# Patient Record
Sex: Female | Born: 1945
Health system: Southern US, Community
[De-identification: ages and names within clinical notes are randomized; demographics above are authoritative.]

## PROBLEM LIST (undated history)

## (undated) DIAGNOSIS — I10 Essential (primary) hypertension: Secondary | ICD-10-CM

## (undated) DIAGNOSIS — N184 Chronic kidney disease, stage 4 (severe): Secondary | ICD-10-CM

## (undated) DIAGNOSIS — I4819 Other persistent atrial fibrillation: Secondary | ICD-10-CM

## (undated) DIAGNOSIS — N289 Disorder of kidney and ureter, unspecified: Secondary | ICD-10-CM

## (undated) DIAGNOSIS — I5032 Chronic diastolic (congestive) heart failure: Secondary | ICD-10-CM

## (undated) DIAGNOSIS — Q600 Renal agenesis, unilateral: Secondary | ICD-10-CM

## (undated) DIAGNOSIS — D649 Anemia, unspecified: Secondary | ICD-10-CM

## (undated) HISTORY — DX: Other persistent atrial fibrillation: I48.19

## (undated) HISTORY — DX: Chronic diastolic (congestive) heart failure: I50.32

## (undated) HISTORY — DX: Chronic kidney disease, stage 4 (severe): N18.4

## (undated) HISTORY — DX: Renal agenesis, unilateral: Q60.0

## (undated) HISTORY — DX: Anemia, unspecified: D64.9

---

## 2000-06-04 ENCOUNTER — Encounter: Admission: RE | Admit: 2000-06-04 | Discharge: 2000-06-04 | Payer: Self-pay | Admitting: Internal Medicine

## 2000-06-04 ENCOUNTER — Encounter: Payer: Self-pay | Admitting: Internal Medicine

## 2000-06-18 ENCOUNTER — Encounter: Admission: RE | Admit: 2000-06-18 | Discharge: 2000-06-18 | Payer: Self-pay | Admitting: Internal Medicine

## 2000-06-18 ENCOUNTER — Encounter: Payer: Self-pay | Admitting: Internal Medicine

## 2001-09-12 ENCOUNTER — Inpatient Hospital Stay (HOSPITAL_COMMUNITY): Admission: AD | Admit: 2001-09-12 | Discharge: 2001-09-16 | Payer: Self-pay | Admitting: Internal Medicine

## 2001-09-12 ENCOUNTER — Encounter: Payer: Self-pay | Admitting: Internal Medicine

## 2001-09-13 ENCOUNTER — Encounter: Payer: Self-pay | Admitting: Internal Medicine

## 2001-11-07 ENCOUNTER — Encounter: Admission: RE | Admit: 2001-11-07 | Discharge: 2001-11-07 | Payer: Self-pay | Admitting: Internal Medicine

## 2001-11-07 ENCOUNTER — Encounter: Payer: Self-pay | Admitting: Internal Medicine

## 2001-11-13 ENCOUNTER — Ambulatory Visit (HOSPITAL_BASED_OUTPATIENT_CLINIC_OR_DEPARTMENT_OTHER): Admission: RE | Admit: 2001-11-13 | Discharge: 2001-11-13 | Payer: Self-pay | Admitting: Internal Medicine

## 2002-01-11 ENCOUNTER — Encounter: Payer: Self-pay | Admitting: Internal Medicine

## 2002-01-11 ENCOUNTER — Encounter: Admission: RE | Admit: 2002-01-11 | Discharge: 2002-01-11 | Payer: Self-pay | Admitting: Internal Medicine

## 2002-02-15 ENCOUNTER — Encounter: Payer: Self-pay | Admitting: Internal Medicine

## 2002-02-15 ENCOUNTER — Encounter: Admission: RE | Admit: 2002-02-15 | Discharge: 2002-02-15 | Payer: Self-pay | Admitting: Internal Medicine

## 2003-01-29 ENCOUNTER — Encounter: Admission: RE | Admit: 2003-01-29 | Discharge: 2003-01-29 | Payer: Self-pay | Admitting: Internal Medicine

## 2003-01-29 ENCOUNTER — Encounter: Payer: Self-pay | Admitting: Internal Medicine

## 2003-03-26 ENCOUNTER — Encounter: Payer: Self-pay | Admitting: Sports Medicine

## 2003-03-26 ENCOUNTER — Ambulatory Visit (HOSPITAL_COMMUNITY): Admission: RE | Admit: 2003-03-26 | Discharge: 2003-03-26 | Payer: Self-pay | Admitting: Sports Medicine

## 2005-08-25 ENCOUNTER — Encounter: Admission: RE | Admit: 2005-08-25 | Discharge: 2005-08-25 | Payer: Self-pay | Admitting: Internal Medicine

## 2006-07-27 ENCOUNTER — Ambulatory Visit (HOSPITAL_COMMUNITY): Admission: RE | Admit: 2006-07-27 | Discharge: 2006-07-27 | Payer: Self-pay | Admitting: Family Medicine

## 2006-07-27 ENCOUNTER — Emergency Department (HOSPITAL_COMMUNITY): Admission: EM | Admit: 2006-07-27 | Discharge: 2006-07-27 | Payer: Self-pay | Admitting: Family Medicine

## 2006-10-30 ENCOUNTER — Encounter: Admission: RE | Admit: 2006-10-30 | Discharge: 2006-10-30 | Payer: Self-pay | Admitting: Sports Medicine

## 2006-12-14 ENCOUNTER — Inpatient Hospital Stay (HOSPITAL_COMMUNITY): Admission: AD | Admit: 2006-12-14 | Discharge: 2006-12-15 | Payer: Self-pay | Admitting: Orthopedic Surgery

## 2007-09-06 ENCOUNTER — Emergency Department (HOSPITAL_COMMUNITY): Admission: EM | Admit: 2007-09-06 | Discharge: 2007-09-06 | Payer: Self-pay | Admitting: Family Medicine

## 2009-03-01 ENCOUNTER — Encounter: Admission: RE | Admit: 2009-03-01 | Discharge: 2009-03-01 | Payer: Self-pay | Admitting: Family Medicine

## 2009-03-15 ENCOUNTER — Encounter: Admission: RE | Admit: 2009-03-15 | Discharge: 2009-03-15 | Payer: Self-pay | Admitting: Family Medicine

## 2009-05-07 ENCOUNTER — Encounter: Admission: RE | Admit: 2009-05-07 | Discharge: 2009-05-07 | Payer: Self-pay | Admitting: Surgery

## 2009-05-08 ENCOUNTER — Encounter (INDEPENDENT_AMBULATORY_CARE_PROVIDER_SITE_OTHER): Payer: Self-pay | Admitting: Surgery

## 2009-05-08 ENCOUNTER — Ambulatory Visit (HOSPITAL_BASED_OUTPATIENT_CLINIC_OR_DEPARTMENT_OTHER): Admission: RE | Admit: 2009-05-08 | Discharge: 2009-05-08 | Payer: Self-pay | Admitting: Surgery

## 2009-05-08 ENCOUNTER — Encounter: Admission: RE | Admit: 2009-05-08 | Discharge: 2009-05-08 | Payer: Self-pay | Admitting: Surgery

## 2011-01-18 LAB — COMPREHENSIVE METABOLIC PANEL
AST: 22 U/L (ref 0–37)
Albumin: 4 g/dL (ref 3.5–5.2)
BUN: 27 mg/dL — ABNORMAL HIGH (ref 6–23)
Calcium: 10.2 mg/dL (ref 8.4–10.5)
Creatinine, Ser: 1.6 mg/dL — ABNORMAL HIGH (ref 0.4–1.2)
GFR calc Af Amer: 40 mL/min — ABNORMAL LOW (ref >60)
GFR calc non Af Amer: 33 mL/min — ABNORMAL LOW (ref >60)

## 2011-01-18 LAB — URINALYSIS, ROUTINE W REFLEX MICROSCOPIC
Glucose, UA: NEGATIVE mg/dL
Ketones, ur: NEGATIVE mg/dL
Nitrite: NEGATIVE
Specific Gravity, Urine: 1.019 (ref 1.005–1.030)
pH: 6 (ref 5.0–8.0)

## 2011-01-18 LAB — DIFFERENTIAL
Eosinophils Relative: 0 % (ref 0–5)
Lymphocytes Relative: 15 % (ref 12–46)
Lymphs Abs: 2.4 10*3/uL (ref 0.7–4.0)
Monocytes Absolute: 0.9 10*3/uL (ref 0.1–1.0)
Neutro Abs: 12.9 10*3/uL — ABNORMAL HIGH (ref 1.7–7.7)

## 2011-01-18 LAB — CBC
MCHC: 33.3 g/dL (ref 30.0–36.0)
MCV: 87.9 fL (ref 78.0–100.0)
Platelets: 343 10*3/uL (ref 150–400)

## 2011-02-24 NOTE — Op Note (Signed)
Jaclyn Day, Jaclyn Day             ACCOUNT NO.:  [id number]   MEDICAL RECORD NO.:  [id number]          PATIENT TYPE:  AMB   LOCATION:  [place]                          FACILITY:  [facility]   PHYSICIAN:  Haywood Lasso, M.D.DATE OF BIRTH:  03/08/1946   DATE OF PROCEDURE:  05/08/2009  DATE OF DISCHARGE:                               OPERATIVE REPORT   PREOPERATIVE DIAGNOSIS:  Atypical lobular hyperplasia, left breast.   POSTOPERATIVE DIAGNOSIS:  Atypical lobular hyperplasia, left breast.   OPERATION:  Needle-guided excision of area of atypical hyperplasia.   SURGEON:  Haywood Lasso, MD   ANESTHESIA:  General.   CLINICAL HISTORY:  This is a 65 year old lady, who recently had a core  biopsy of an abnormality seen on mammography.  This had some atypical  lobular hyperplasia and a wider excisional biopsy was recommended until  we have a more definitive diagnosis.   DESCRIPTION OF PROCEDURE:  The patient was seen in the holding area and  she had no further questions.  The patient does have some degree of  schizophrenia and her sister was also here with her and I went over with  her sister as well all the plans.   The patient was taken to the operating room and after satisfactory  general anesthesia had been obtained, the left breast was prepped and  draped.  The time-out was done.   I had already reviewed the mammogram films, and the guidewire entered  laterally and tracked medially.  I made a transverse incision just above  the guidewire entry site, but utilizing the old core biopsy site.  I  elevated a few skin flaps and manipulated the guidewire into the wound.  I then grasped tissue around the guidewire on either side with some  Allis clamps and excised a wide cylinder of tissue around the guidewire  until I was deep to the tip.  This was oriented for pathology.   Specimen mammogram was done showing the clip to be in approximately the  middle of the specimen.  This was  confirmed by the radiologist.   I infiltrated some 0.25% plain Marcaine in.  I irrigated and made sure  everything was dry and then closed in layers with 3-0 Vicryl, 4-0  Monocryl subcuticular, and Dermabond.   The patient tolerated the procedure well and there were no  complications.  All counts were correct.      Haywood Lasso, M.D.  Electronically Signed     CJS/MEDQ  D:  05/08/2009  T:  05/09/2009  Job:  [id number]   cc:   Heath Gold, PA-C

## 2011-02-27 NOTE — Discharge Summary (Signed)
[place]. [facility]  Patient:    Jaclyn Day, Jaclyn Day Visit Number: [id number] MRN: [id number]          Service Type: MED Location: [phone] Attending Physician:  Anne Ng Dictated by:   Nehemiah Settle, M.D. Admit Date:  09/12/2001 Discharge Date: 09/16/2001                             Discharge Summary  ADMISSION DIAGNOSIS:  Shortness of breath.  DISCHARGE DIAGNOSES: 1. Shortness of breath secondary to bronchitis and atelectasis. 2. Elevated CK with no evidence of coronary artery disease. 3. Major depression. 4. Chronic hypertension. 5. Morbid obesity.  Please see admission history and physical examination for details.  HOSPITAL COURSE:  The patient was admitted and was given higher doses of Lasix initially.  Her chest x-ray actually looked better at admission than it had three days prior to admission at which time it seemed to indicate congestive heart failure.  In any event, she continued to be mildly hypoxic.  She had a CT scan of the chest which showed no evidence of pulmonary embolism but did reveal evidence of atelectasis.  CKs were elevated with positive MB, but troponins were negative.  She underwent cardiac catheterization by Fabio Asa, M.D., with negative findings.  We were able to taper her oxygen off and it was presumed that atelectasis secondary to bronchitis was the cause of her shortness of breath.  No other cause could be found with careful evaluation.  She did complain of some abdominal pain the night prior to discharge. We checked a CBC just prior to discharge and it was fine. The right groin was slightly bruised. There was no evidence of bruit in the right groin.  DISCHARGE MEDICATIONS: 1. Neurontin 200 mg morning, afternoon, and 400 mg at bedtime. 2. Seroquel 25 mg at these same times. 3. Klonopin 0.5 mg b.i.d. 4. Plendil 5 mg b.i.d. 5. Atenolol once daily. 6. Altace 2.5 mg daily. 7. Lasix 40 mg  daily.  ACTIVITY:  As tolerated.  DIET:  No added salt.  FOLLOW-UP:  She will have follow-up with me on September 26, 2001, as already scheduled. Dictated by:   Nehemiah Settle, M.D. Attending Physician:  Anne Ng DD:  09/16/01 TD:  09/16/01 Job: [id number]

## 2011-02-27 NOTE — Op Note (Signed)
Jaclyn Day, Jaclyn Day NO.:  [id number]   MEDICAL RECORD NO.:  [id number]          PATIENT TYPE:  OIB   LOCATION:  5731                         FACILITY:  [facility]   PHYSICIAN:  Ninetta Lights, M.D. DATE OF BIRTH:  1946/01/13   DATE OF PROCEDURE:  12/13/2006  DATE OF DISCHARGE:                               OPERATIVE REPORT   PREOPERATIVE DIAGNOSIS:  Left knee torn medial meniscus with  tricompartmental degenerative arthritis.   POSTOPERATIVE DIAGNOSES:  1. Torn medial and lateral meniscus, left knee with diffuse      tricompartmental degenerative arthritis grade 3, and even grade 4.  2. Chondral and osteochondral loose bodies.   PROCEDURE:  1. Left knee exam under anesthesia.  2. Arthroscopy.  3. Partial medial and lateral meniscectomy.  4. Chondroplasty of the patellofemoral joint, as well as medial and      lateral compartments.  5. Removal of chondral and osteochondral loose bodies.   SURGEON:  Ninetta Lights, M.D.   ASSISTANT:  Alyson Locket. Velora Heckler.   ANESTHESIA:  General.   BLOOD LOSS:  Minimal.   TOURNIQUET:  Not employed.   SPECIMENS:  None.   CULTURES:  None.   COMPLICATIONS:  None.   DRESSINGS:  Soft compressive.   PROCEDURE:  The patient brought to the operating room and after adequate  anesthesia had been obtained, the left knee examined.  About a 5-degree  flexion contracture, further flexion reasonable.  Stable ligaments.  Patellofemoral and some tibial femoral crepitus.  Tourniquet and leg  holder applied.  The leg prepped and draped in the usual sterile  fashion.  Three portals created, one superolateral, one each medial and  lateral parapatellar.  Inflow catheter introduced.  The knee distended.  Arthroscope introduced and the knee inspected.  Good patellofemoral  tracking, but extensive grade 3 changes both sides of the joint.  Chondroplasty to a stable surface.  Hypertrophic synovitis, chondral  loose bodies throughout the  knee removed.  Medial and lateral  compartments had at least grade 3 changes throughout.  On the medial  side, some areas that were grade 4, both on the tibia and femur.  Chondroplasty to a stable surface.  The chondral loose bodies and some  small osteochondral loose bodies were removed with a grasping forceps.  Medical meniscus extensive complex tearing posterior half taken out to a  stable rim and tapered into remaining meniscus.  Lateral meniscus  tearing in the middle of the anterior third with extension into the  posterior third.  __________  retaining some of the meniscus around the  rim at completion.  Cruciate ligament is intact.  At the completion all  recesses examined to be sure all loose fragments were removed.  Instruments and fluid removed.  Portals in the knee injected with  Marcaine.  Portals closed with 4-0 nylon.  A sterile compressive  dressing applied.  Anesthesia reversed.  Brought to recovery room.  Tolerated surgery well with no complications.      Ninetta Lights, M.D.  Electronically Signed     DFM/MEDQ  D:  12/13/2006  T:  12/13/2006  Job:  [id number]

## 2011-02-27 NOTE — H&P (Signed)
[place]. [facility]  Patient:    Jaclyn, Day Visit Number: [id number] MRN: [id number]          Service Type: Attending:  Anne Ng, M.D. Dictated by:   Anne Ng, M.D.                           History and Physical  HISTORY OF PRESENT ILLNESS:  Jaclyn Day is a 65 year old lady who had initially presented to the office on September 09, 2001 with complaints of increased shortness of breath associated with dyspnea on exertion and ankle edema.  A chest x-ray obtained at that time showed signs of cardiomegaly, vascular redistribution and interstitial edema.  For this reason, I adjusted her medications by instructing her to stop Hytrin and to start Altace 2.5 mg daily as well as Lasix 40 mg daily.  At the time of this visit, her blood pressure was 108/64.  Unfortunately, since this time her condition has, if anything, worsened.  She is having increased orthopnea, she states that she is having increased ankle edema, dyspnea seems to be worse and she is unable to sleep at night.  There has been no recent history of chest pain, although she does state that about five days ago she had an aching or throbbing which she localizes to the mid-scapular area.  She is admitted to the hospital for the purpose of evaluation of worsening congestive heart failure.  PAST MEDICAL HISTORY:  Other active health problems include: 1. Major depression. 2. Morbid obesity. 3. Chronic hypertension.  PAST SURGICAL HISTORY: 1. Laparoscopic cholecystectomy. 2. History of unilateral nephrectomy for a congenital abnormality. 3. The patient has also had previous wrist and foot fractures.  CURRENT MEDICATIONS: 1. Neurontin 200 mg in the morning, 200 mg in the afternoon, 400 mg in the    evening. 2. Seroquel 25 mg at these same times. 3. She also takes Klonopin 0.25 mg twice day. 4. Plendil 5 mg twice daily. 5. Atenolol once daily. 6. Altace 2.5 mg daily. 7.  Lasix 40 mg daily.  ALLERGIES:  She reports that she is allergic to CODEINE which has produced hives.  SOCIAL HISTORY:  The patient does not smoke.  She denies the use of alcohol. The patient is felt to have evidence of significant mental retardation with IQ scores in the 60 to 80 range and her sister has power of attorney as well as health care power of attorney.  FAMILY HISTORY:  Her mother died of congestive heart failure and had a prior history of colon cancer.  Her father had pulmonary fibrosis and diabetes.  REVIEW OF SYSTEMS:  HEAD:  She denies headache or dizziness.  EYES:  She denies visual blurring or diplopia.  EARS, NOSE AND THROAT:  She denies earache, sinus pain or sore throat.  CHEST:  She is experiencing a persistent dry cough.  She has significant orthopnea.  She denies chest pain, as noted above.  CARDIOVASCULAR:  See above.  GI:  She has had no nausea, vomiting, abdominal pain, change in bowel habits, melena or hematochezia.  GU:  She denies dysuria or urinary frequency, hesitancy or nocturia.  RHEUMATOLOGIC: She denies back pain or joint pain.  HEMATOLOGIC:  She denies easy bleeding or bruising.  NEUROLOGIC:  She denies history of seizure or stroke.  PHYSICAL EXAMINATION:  VITAL SIGNS:  On physical exam, her weight is 249 pounds, blood pressure is 120/70, pulse is 68.  HEENT/NECK:  Remarkable for significant jugular venous distention.  There is no lymphadenopathy or thyromegaly.  CHEST:  Rales two-thirds of the way up, right greater than left.  CARDIOVASCULAR:  Regular rate and rhythm.  I do not really hear an S3.  There are no rubs or gallops.  ABDOMEN:  Obese and normal bowel sounds; without masses, tenderness or organomegaly.  EXTREMITIES:  Examination reveals 3+ pretibial edema.  IMPRESSION: 1. Evidence of worsening congestive heart failure. 2. Major depression. 3. History of morbid obesity. 4. Hypertension. 5. Chronic headaches. 6. Significant  mental retardation. 7. Status post cholecystectomy. 8. Status post nephrectomy.  The patient will be admitted to the hospital for the purpose of treating congestive heart failure unresponsive to outpatient management.  We will increase her Lasix dosage and continue the ACE inhibitor.  We will continue to withhold Hytrin.  She already has an echocardiogram scheduled for today and we will look carefully at the results of this test.  Presumably, cardiology consultation may be indicated.  I will obtain serial cardiac enzymes, although I doubt that she is currently having an myocardial infarction.  We will also check O2 saturation and adjust oxygen accordingly.  It should be noted that on September 09, 2001, the patient had a basic metabolic profile and complete blood count which were remarkable only for a hemoglobin of 10.8. Dictated by:   Anne Ng, M.D. Attending:  Anne Ng, M.D. DD:  09/12/01 TD:  09/12/01 Job: [id number]

## 2011-02-27 NOTE — Op Note (Signed)
[place]. [facility]  Patient:    Jaclyn Day, Jaclyn Day Visit Number: [id number] MRN: [id number]          Service Type: MED Location: [phone] Attending Physician:  Anne Ng Dictated by:   Fabio Asa, M.D. Proc. Date: 09/15/01 Admit Date:  09/12/2001   CC:         Dr. Octavia Bruckner   Operative Report  REFERRING PHYSICIAN:  Dr. Octavia Bruckner ______.  INDICATIONS FOR PROCEDURE:  Positive cardiac enzymes, dyspnea.  DESCRIPTION OF PROCEDURE:  After obtaining written informed consent, the patient was brought to the cardiac catheterization laboratory in a post-absorptive state.  Preoperative sedation was achieved using IV Versed. The right groin was prepped and draped in a usual sterile fashion.  Local anesthesia was achieved using 1% Xylocaine.  A 6 French hemostasis sheath was placed into the right femoral artery.  I was unable to obtain access in the right femoral vein, therefore, I proceed with the left femoral vein.  The left groin was prepped and draped.  Local anesthesia was achieved using 1% Xylocaine.  An 8 [place] hemostasis sheath was placed into the left femoral vein using a modified Seldinger technique.  Right heart catheterization data was obtained using a 7 [place] Cort-Quin swan.  Coronary angiography was performed using a JL4/JR4 Judkins catheter.  Multiple views were obtained. All catheter exchanges were made over a guide wire.  Single plane ventriculogram was made using a 6 French pigtail curved catheter.  The patient was given 3000 units of heparin prior to the procedure.  Following the procedure, ICT was obtained and was noted to be 169.  The patient was transferred to the holding area where the hemostasis sheath would be removed.  FINDINGS:  The aortic pressure is 140/82, LV pressure is 144/9, right atrial mean is 10, RV pressure is 43/8, PA pressure is 38/15, ______ is 21.  Single plane ventriculogram revealed normal wall motion with an  ejection fraction of 60%.  There was no mitral regurgitation noted.  Coronary angiography:  The left main coronary artery was bifurcated into the left anterior descending artery and circumflex vessel.  There was no disease noted in the left main coronary artery.  Left anterior descending artery:  The left anterior descending artery gives rise to a large D1, moderate D2, small D3, and ends as an apical recurrent branch.  There is no significant disease of the left anterior descending artery nor its branches.  Circumflex vessels:  Circumflex vessel is a moderate size vessel giving rise to a large OM1, large OM2, and goes on to the end of an AV groove.  There is no significant disease in the circumflex or its branches.  Right coronary artery:  The right coronary artery is a large dominant artery, giving rise to a small RV marginal one, large PDA large PL branch.  There is no significant disease in the right coronary artery or its branches.  IMPRESSION: 1. Normal coronary angiography. 2. Normal ventriculogram. 3. Normal pulmonary artery pressure. 4. Normal cardiac outputs.  Cardiac output average was 6.3.  RECOMMENDATIONS:  The patient will be transferred to her room.  She may be discharged from a cardiology standpoint when her O2 saturation is more than 90.  It is likely that her O2 saturation is the result of atelectasis, and deconditioning. Dictated by:   Fabio Asa, M.D. Attending Physician:  Anne Ng DD:  09/15/01 TD:  09/15/01 Job: [id number]

## 2011-02-27 NOTE — Discharge Summary (Signed)
Jaclyn Day, Jaclyn Day NO.:  [id number]   MEDICAL RECORD NO.:  [id number]          PATIENT TYPE:  INP   LOCATION:  [id number]                         FACILITY:  [facility]   PHYSICIAN:  Ninetta Lights, M.D. DATE OF BIRTH:  1946-07-19   DATE OF ADMISSION:  12/13/2006  DATE OF DISCHARGE:                               [place] COURSE:  December 15, 2006, the patient doing very well.  Good pain  control.  She is ambulating without extreme difficulty.  Complained of  moderate discomfort.  States that she is ready for transfer today to  skilled facility.  Temp 97.1, pulse 61, respirations 20, blood pressure  138/78.   INSTRUCTIONS:  As previously noted, the patient will start formal  physical therapy for left knee range of motion and strengthening  protocol.  Weightbearing as tolerated.  Later on this afternoon, the  dressing can be removed and the patient can shower.  No tub soaking.  Apply Band-Aids to portals.  If she has any drainage from her portals,  can apply 4 x 4 gauze and rewrap with a 6 inch Ace wrap.  Please call  the office to schedule an appointment for next Tuesday for recheck.  Let  us know if there are any concerns or issues before that time.     ______________________________  unknown      Ninetta Lights, M.D.  Electronically Signed    Hillard Danker  D:  12/15/2006  T:  12/15/2006  Job:  [id number]

## 2011-02-27 NOTE — Discharge Summary (Signed)
Jaclyn Day, Jaclyn NO.:  [id number]   MEDICAL RECORD NO.:  [id number]          PATIENT TYPE:  OIB   LOCATION:  [id number]                         FACILITY:  [facility]   PHYSICIAN:  Ninetta Lights, M.D. DATE OF BIRTH:  1945/10/17   DATE OF ADMISSION:  12/13/2006  DATE OF DISCHARGE:  12/14/2006                               DISCHARGE SUMMARY   FINAL DIAGNOSES:  1. Status post left knee arthroscopy with debridement for      medial/lateral meniscal tears and degenerative joint disease.  2. Sleep apnea.  3. Right nephrectomy.  4. Hypertension.  5. Depression.   HISTORY OF PRESENT ILLNESS:  A 65 year old white female who presented to  our office with complaints of left knee pain secondary to DJD and  meniscus tears. She had progressive worsening pain with failed response  to conservative treatment.   HOSPITAL COURSE:  On December 13, 2006 the patient was taken to the [facility] and a left knee arthroscopy with debridement and partial/lateral  meniscectomy procedure performed.  Surgeon Kathryne Hitch, M.D. and  assistant Alyson Locket. Ricard Dillon, PA-C.  Anesthesia was general.  EBL minimal.  There were no surgical specimens.  No complications and the patient was  transferred to recovery in stable condition.  The patient admitted for  overnight observation due to a history of sleep apnea.  Plan to transfer  to [place] December 14, 2006.  On December 14, 2006 the patient  doing very well with good pain control.  No specific complaints.  She  states that she is ready for transfer.  Temperature 97.5, pulse 67,  respirations 20, blood pressure 125/56.  WBC 14.4, hematocrit 35.2,  hemoglobin 12.1, platelets 256, sodium 136, potassium 4.2, chloride 102,  CO2 28, BUN 12, creatinine 1.3, glucose 119.  Dressing clean, dry and  intact.  Calves nontender.  Neurovascularly intact.  Range of motion of  0 to 95+ degrees.   DISPOSITION:  Transfer to extended care today.   CONDITION:   Good and stable.   MEDICATIONS:  1. Percocet 5/325, 1-2 tabs p.o. q.4-6h. p.r.n. for pain.  2. Colace 100 mg p.o. b.i.d.  3. Hydrochlorothiazide 25 mg p.o. daily.  4. Lopressor 50 mg p.o. daily.  5. Paxil 20 mg p.o. daily.  6. Lamictal 100 mg p.o. daily.  7. Norvasc 10 mg p.o. daily.  8. Lotensin 20 mg p.o. daily.  9. Robaxin 500 mg p.o. q.6h. p.r.n. for spasms.  10.Ativan 0.5 mg p.o. q.12h. p.r.n. for anxiety.   DISCHARGE INSTRUCTIONS:  The patient will work with formal physical  therapy for left knee range of motion and strengthening protocol after  knee arthroscopy.  She is weightbearing as tolerated.  Dressing to be  removed 48 hours postop and the patient can shower at that time.  Apply  Band-Aid's over surgical portals.  She will have follow up in the office  with Dr. Percell Miller one week postop for recheck.  Call the office  immediately with any questions or concerns before that time.      Alyson Locket. Ricard Dillon, P.A.  Ninetta Lights, M.D.  Electronically Signed    JMO/MEDQ  D:  12/14/2006  T:  12/14/2006  Job:  [id number]

## 2011-02-27 NOTE — Consult Note (Signed)
[facility]  Patient:    Jaclyn Day, Jaclyn Day Visit Number: [id number] MRN: [id number]          Service Type: Attending:  Fabio Asa, M.D. Dictated by:   Fabio Asa, M.D.   CC:         Anne Ng, M.D.             Nehemiah Settle, M.D.                          Consultation Report  REFERRING PHYSICIAN:  Anne Ng, M.D.  REASON FOR CONSULTATION:  Dyspnea.  HISTORY OF PRESENT ILLNESS:  Jaclyn Day is a 65 year old female who noticed an abrupt onset of shortness of breath five days prior to admission. She states the shortness of breath occurred suddenly while walking with her boyfriend to the car.  This shortness of breath forced her to rest for 10 minutes before she was able to resume activity.  She has continued to remain short of breath and was evaluated by Dr. Anne Ng in the outpatient clinic on Friday.  A chest x-ray was obtained.  Chest x-ray was suggestive of cardiomegaly and mild congestive heart failure.  The patient was subsequently started on Altace and Lasix.  She had no significant improvement over the weekend.  She presented to the clinic again on Monday.  At this time, she was noted to have ongoing shortness of breath.  No chest pain.  She had a productive green phlegm cough but no fever.  She did have chills.  There was no orthopnea, PND, tachyarrhythmia.  The patient noted pedal edema.  It was elected to admit the patient for inpatient treatment for congestive heart failure.  A transesophageal echocardiogram was obtained.  She was noted to normal chamber dimensions, normal systolic function.  Her ejection fraction was approximately 65-75%.  Normal valvular morphology and normal transmitral flow velocity.  Upon admission to the hospital, she was started on Lasix 80 mg IV x 1 and 40 mg IV q.12h.  She was continued on Plendil 5 mg q.d. and Atenolol for her blood pressure.  Her O2 saturation on room air  was noted to be 87%.  Serial cardiac enzymes were obtained.  She was noted to have a mildly elevated CK at 262 with a MB of 8.4.  Of interest, she had a normal troponin.  Chest x-ray was obtained as well.  This revealed mild cardiomegaly but no evidence of interstitial edema.  Following treatment with O2, Lasix, and continuation of her antihypertensive, Ms. Debari notes significant improvement in her shortness of breath.  PAST MEDICAL HISTORY:  Significant for severe depression.  She currently is under the care of a psychiatrist.  Hypertension for more than 10 years. Chronic vertigo.  Chronic headache.  PAST SURGICAL HISTORY:  Significant for a cholecystectomy.  Left nephrectomy, etiology nephrectomy is unknown.  Excision of a bone cyst from her right arm.  MEDICATIONS: 1. Atenolol 50 mg q.d. 2. Altace 2.5 mg q.d. 3. Lasix 40 mg q.d. 4. Seroquel 25 mg b.i.d. 5. Paxil 40 mg q.d. 6. Neurontin 100 mg in the morning and 200 mg q.p.m. 7. Plendil 50 mg p.o. q.d.  ADMISSION MEDICATIONS: 1. Change of p.o. Lasix to Lasix 80 mg IV x 1 and then 40 mg x 2. 2. Her Altace is currently on hold. 3. She was added sublingual nitroglycerin and Ambien as well as Robitussin    for her  cough.  ALLERGIES:  CODEINE with result in a rash.  SOCIAL HISTORY:  She is divorced since 42.  She has a boyfriend.  Has a supportive sister who she list as her contact person.  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  There is no history of tobacco use, alcohol use, or other drugs.  FAMILY HISTORY:  There is no significant family history of coronary artery disease.  PHYSICAL EXAMINATION:  VITAL SIGNS:  Her systolic pressure has ranged from 149 to 168/84.  Heart rate has been from 70 to 85 beats per minute.  She is afebrile.  O2 saturation is 94% on three liters.  Telemetry is not obtained.  GENERAL:  In general, she appears to be an obese middle-aged female in  no acute distress.  HEENT:  Unremarkable.  NECK:  There is no evidence of neck vein distention.  There is no carotid bruit.  Her thyroid is not palpable.  LUNGS:  Breath sounds which are equal and clear to auscultation.  There is no crackles noted.  CARDIOVASCULAR:  Normal S1 and normal S2 with regular rate and rhythm.  No murmurs, rubs, or gallops are noted.  ABDOMEN:  Obese.  Exam is made difficult.  No unusual bruits or pulsations are noted.  EXTREMITIES:  No peripheral edema.  Distal pulses are equal and palpable.  SKIN:  Warm and dry.  NEUROLOGICAL:  Nonfocal.  Her motor is 5/5 to her arm.  LABORATORY DATA:  ECG was reviewed and reveals a sinus rhythm.  There is nonspecific ST changes.  Electrolytes are within normal limits with the exception of a mildly elevated glucose at 124.  Her white count is slightly elevated at 11.6.  Her hemoglobin is 11.9.  Serial CKs are as noted above. Her initial CK was slightly elevated at 262 with a MB fraction of 8.4.  She has a normal troponin.  IMPRESSION:  Middle-aged female with abrupt onset of dyspnea by history.  It is most suggestive of an upper respiratory infection.  However, in view of her slightly elevated CK, cardiac etiology must be considered.  I would initially obtained a spinal CT to evaluate for pulmonary emboli in that she has the risk factors of obesity and sedentary activity.  Pulmonary function test with diffusion capacity should be obtained as well.  If these are normal, coronary angiography with right heart catheterization will be considered in view of her elevated CK.  Additionally, I would add Lovenox per pharmacy protocol as well as enteric-coated aspirin.  Telemetry monitoring has been ordered.  Thank you for allowing me to participate in the care of your patient.  I will discuss this patient further with you.Dictated by:   Fabio Asa, M.D.  Attending:  Fabio Asa, M.D.  DD:  09/13/01 TD:   09/13/01 Job: [id number]

## 2011-05-22 ENCOUNTER — Ambulatory Visit (HOSPITAL_COMMUNITY)
Admission: RE | Admit: 2011-05-22 | Discharge: 2011-05-22 | Disposition: A | Discharge status: Home / Self Care | Payer: Medicare Other | Source: Ambulatory Visit | Attending: Sports Medicine | Admitting: Sports Medicine

## 2011-05-22 DIAGNOSIS — M7989 Other specified soft tissue disorders: Secondary | ICD-10-CM

## 2011-10-19 DIAGNOSIS — R059 Cough, unspecified: Secondary | ICD-10-CM | POA: Diagnosis not present

## 2011-10-19 DIAGNOSIS — R05 Cough: Secondary | ICD-10-CM | POA: Diagnosis not present

## 2011-10-30 DIAGNOSIS — I10 Essential (primary) hypertension: Secondary | ICD-10-CM | POA: Diagnosis not present

## 2011-10-30 DIAGNOSIS — Z79899 Other long term (current) drug therapy: Secondary | ICD-10-CM | POA: Diagnosis not present

## 2011-10-30 DIAGNOSIS — E119 Type 2 diabetes mellitus without complications: Secondary | ICD-10-CM | POA: Diagnosis not present

## 2011-10-30 DIAGNOSIS — M1710 Unilateral primary osteoarthritis, unspecified knee: Secondary | ICD-10-CM | POA: Diagnosis not present

## 2011-10-30 DIAGNOSIS — IMO0002 Reserved for concepts with insufficient information to code with codable children: Secondary | ICD-10-CM | POA: Diagnosis not present

## 2011-11-27 DIAGNOSIS — F333 Major depressive disorder, recurrent, severe with psychotic symptoms: Secondary | ICD-10-CM | POA: Diagnosis not present

## 2012-03-04 DIAGNOSIS — F333 Major depressive disorder, recurrent, severe with psychotic symptoms: Secondary | ICD-10-CM | POA: Diagnosis not present

## 2012-04-29 DIAGNOSIS — I10 Essential (primary) hypertension: Secondary | ICD-10-CM | POA: Diagnosis not present

## 2012-04-29 DIAGNOSIS — M1710 Unilateral primary osteoarthritis, unspecified knee: Secondary | ICD-10-CM | POA: Diagnosis not present

## 2012-04-29 DIAGNOSIS — E119 Type 2 diabetes mellitus without complications: Secondary | ICD-10-CM | POA: Diagnosis not present

## 2012-04-29 DIAGNOSIS — Z79899 Other long term (current) drug therapy: Secondary | ICD-10-CM | POA: Diagnosis not present

## 2012-04-29 DIAGNOSIS — E7800 Pure hypercholesterolemia, unspecified: Secondary | ICD-10-CM | POA: Diagnosis not present

## 2012-04-29 DIAGNOSIS — IMO0002 Reserved for concepts with insufficient information to code with codable children: Secondary | ICD-10-CM | POA: Diagnosis not present

## 2012-05-03 DIAGNOSIS — IMO0002 Reserved for concepts with insufficient information to code with codable children: Secondary | ICD-10-CM | POA: Diagnosis not present

## 2012-05-03 DIAGNOSIS — M1710 Unilateral primary osteoarthritis, unspecified knee: Secondary | ICD-10-CM | POA: Diagnosis not present

## 2012-06-03 DIAGNOSIS — F333 Major depressive disorder, recurrent, severe with psychotic symptoms: Secondary | ICD-10-CM | POA: Diagnosis not present

## 2012-06-17 DIAGNOSIS — M79609 Pain in unspecified limb: Secondary | ICD-10-CM | POA: Diagnosis not present

## 2012-07-22 DIAGNOSIS — IMO0002 Reserved for concepts with insufficient information to code with codable children: Secondary | ICD-10-CM | POA: Diagnosis not present

## 2012-07-22 DIAGNOSIS — M1710 Unilateral primary osteoarthritis, unspecified knee: Secondary | ICD-10-CM | POA: Diagnosis not present

## 2012-07-22 DIAGNOSIS — Z23 Encounter for immunization: Secondary | ICD-10-CM | POA: Diagnosis not present

## 2012-07-22 DIAGNOSIS — Z79899 Other long term (current) drug therapy: Secondary | ICD-10-CM | POA: Diagnosis not present

## 2012-07-22 DIAGNOSIS — I10 Essential (primary) hypertension: Secondary | ICD-10-CM | POA: Diagnosis not present

## 2012-09-02 DIAGNOSIS — F333 Major depressive disorder, recurrent, severe with psychotic symptoms: Secondary | ICD-10-CM | POA: Diagnosis not present

## 2012-09-30 DIAGNOSIS — M109 Gout, unspecified: Secondary | ICD-10-CM | POA: Diagnosis not present

## 2012-09-30 DIAGNOSIS — I10 Essential (primary) hypertension: Secondary | ICD-10-CM | POA: Diagnosis not present

## 2012-09-30 DIAGNOSIS — K219 Gastro-esophageal reflux disease without esophagitis: Secondary | ICD-10-CM | POA: Diagnosis not present

## 2012-10-03 DIAGNOSIS — M25579 Pain in unspecified ankle and joints of unspecified foot: Secondary | ICD-10-CM | POA: Diagnosis not present

## 2012-10-03 DIAGNOSIS — M19079 Primary osteoarthritis, unspecified ankle and foot: Secondary | ICD-10-CM | POA: Diagnosis not present

## 2012-10-21 DIAGNOSIS — M109 Gout, unspecified: Secondary | ICD-10-CM | POA: Diagnosis not present

## 2012-10-21 DIAGNOSIS — IMO0002 Reserved for concepts with insufficient information to code with codable children: Secondary | ICD-10-CM | POA: Diagnosis not present

## 2012-10-21 DIAGNOSIS — E7800 Pure hypercholesterolemia, unspecified: Secondary | ICD-10-CM | POA: Diagnosis not present

## 2012-10-21 DIAGNOSIS — M1710 Unilateral primary osteoarthritis, unspecified knee: Secondary | ICD-10-CM | POA: Diagnosis not present

## 2012-10-21 DIAGNOSIS — E119 Type 2 diabetes mellitus without complications: Secondary | ICD-10-CM | POA: Diagnosis not present

## 2012-10-21 DIAGNOSIS — I10 Essential (primary) hypertension: Secondary | ICD-10-CM | POA: Diagnosis not present

## 2013-01-20 DIAGNOSIS — N183 Chronic kidney disease, stage 3 unspecified: Secondary | ICD-10-CM | POA: Diagnosis not present

## 2013-01-20 DIAGNOSIS — Z9181 History of falling: Secondary | ICD-10-CM | POA: Diagnosis not present

## 2013-01-20 DIAGNOSIS — E119 Type 2 diabetes mellitus without complications: Secondary | ICD-10-CM | POA: Diagnosis not present

## 2013-01-20 DIAGNOSIS — I10 Essential (primary) hypertension: Secondary | ICD-10-CM | POA: Diagnosis not present

## 2013-01-20 DIAGNOSIS — K219 Gastro-esophageal reflux disease without esophagitis: Secondary | ICD-10-CM | POA: Diagnosis not present

## 2013-02-14 DIAGNOSIS — Z6841 Body Mass Index (BMI) 40.0 and over, adult: Secondary | ICD-10-CM | POA: Diagnosis not present

## 2013-02-14 DIAGNOSIS — M79609 Pain in unspecified limb: Secondary | ICD-10-CM | POA: Diagnosis not present

## 2013-02-24 DIAGNOSIS — F333 Major depressive disorder, recurrent, severe with psychotic symptoms: Secondary | ICD-10-CM | POA: Diagnosis not present

## 2013-03-03 DIAGNOSIS — E119 Type 2 diabetes mellitus without complications: Secondary | ICD-10-CM | POA: Diagnosis not present

## 2013-03-03 DIAGNOSIS — H5210 Myopia, unspecified eye: Secondary | ICD-10-CM | POA: Diagnosis not present

## 2013-03-03 DIAGNOSIS — H2510 Age-related nuclear cataract, unspecified eye: Secondary | ICD-10-CM | POA: Diagnosis not present

## 2013-03-03 DIAGNOSIS — H524 Presbyopia: Secondary | ICD-10-CM | POA: Diagnosis not present

## 2013-05-05 DIAGNOSIS — M1710 Unilateral primary osteoarthritis, unspecified knee: Secondary | ICD-10-CM | POA: Diagnosis not present

## 2013-05-05 DIAGNOSIS — I10 Essential (primary) hypertension: Secondary | ICD-10-CM | POA: Diagnosis not present

## 2013-05-05 DIAGNOSIS — N318 Other neuromuscular dysfunction of bladder: Secondary | ICD-10-CM | POA: Diagnosis not present

## 2013-05-05 DIAGNOSIS — E119 Type 2 diabetes mellitus without complications: Secondary | ICD-10-CM | POA: Diagnosis not present

## 2013-05-05 DIAGNOSIS — IMO0002 Reserved for concepts with insufficient information to code with codable children: Secondary | ICD-10-CM | POA: Diagnosis not present

## 2013-05-19 DIAGNOSIS — F333 Major depressive disorder, recurrent, severe with psychotic symptoms: Secondary | ICD-10-CM | POA: Diagnosis not present

## 2013-05-26 DIAGNOSIS — Z79899 Other long term (current) drug therapy: Secondary | ICD-10-CM | POA: Diagnosis not present

## 2013-07-17 DIAGNOSIS — M25569 Pain in unspecified knee: Secondary | ICD-10-CM | POA: Diagnosis not present

## 2013-07-24 DIAGNOSIS — M1710 Unilateral primary osteoarthritis, unspecified knee: Secondary | ICD-10-CM | POA: Diagnosis not present

## 2013-07-24 DIAGNOSIS — S8000XA Contusion of unspecified knee, initial encounter: Secondary | ICD-10-CM | POA: Diagnosis not present

## 2013-07-24 DIAGNOSIS — M25469 Effusion, unspecified knee: Secondary | ICD-10-CM | POA: Diagnosis not present

## 2013-08-11 DIAGNOSIS — F3342 Major depressive disorder, recurrent, in full remission: Secondary | ICD-10-CM | POA: Diagnosis not present

## 2013-08-17 DIAGNOSIS — E119 Type 2 diabetes mellitus without complications: Secondary | ICD-10-CM | POA: Diagnosis not present

## 2013-08-17 DIAGNOSIS — M1710 Unilateral primary osteoarthritis, unspecified knee: Secondary | ICD-10-CM | POA: Diagnosis not present

## 2013-08-17 DIAGNOSIS — Z23 Encounter for immunization: Secondary | ICD-10-CM | POA: Diagnosis not present

## 2013-08-17 DIAGNOSIS — I10 Essential (primary) hypertension: Secondary | ICD-10-CM | POA: Diagnosis not present

## 2013-08-17 DIAGNOSIS — IMO0002 Reserved for concepts with insufficient information to code with codable children: Secondary | ICD-10-CM | POA: Diagnosis not present

## 2013-08-17 DIAGNOSIS — N318 Other neuromuscular dysfunction of bladder: Secondary | ICD-10-CM | POA: Diagnosis not present

## 2013-08-23 DIAGNOSIS — IMO0002 Reserved for concepts with insufficient information to code with codable children: Secondary | ICD-10-CM | POA: Diagnosis not present

## 2013-08-23 DIAGNOSIS — M1710 Unilateral primary osteoarthritis, unspecified knee: Secondary | ICD-10-CM | POA: Diagnosis not present

## 2013-09-22 DIAGNOSIS — N3946 Mixed incontinence: Secondary | ICD-10-CM | POA: Diagnosis not present

## 2013-09-22 DIAGNOSIS — N318 Other neuromuscular dysfunction of bladder: Secondary | ICD-10-CM | POA: Diagnosis not present

## 2013-09-22 DIAGNOSIS — N281 Cyst of kidney, acquired: Secondary | ICD-10-CM | POA: Diagnosis not present

## 2013-09-22 DIAGNOSIS — N390 Urinary tract infection, site not specified: Secondary | ICD-10-CM | POA: Diagnosis not present

## 2013-09-24 DIAGNOSIS — N390 Urinary tract infection, site not specified: Secondary | ICD-10-CM | POA: Diagnosis not present

## 2013-09-26 DIAGNOSIS — N281 Cyst of kidney, acquired: Secondary | ICD-10-CM | POA: Diagnosis not present

## 2013-09-26 DIAGNOSIS — N3942 Incontinence without sensory awareness: Secondary | ICD-10-CM | POA: Diagnosis not present

## 2013-09-26 DIAGNOSIS — N390 Urinary tract infection, site not specified: Secondary | ICD-10-CM | POA: Diagnosis not present

## 2013-09-26 DIAGNOSIS — K5730 Diverticulosis of large intestine without perforation or abscess without bleeding: Secondary | ICD-10-CM | POA: Diagnosis not present

## 2013-10-03 DIAGNOSIS — N3946 Mixed incontinence: Secondary | ICD-10-CM | POA: Diagnosis not present

## 2013-10-03 DIAGNOSIS — N390 Urinary tract infection, site not specified: Secondary | ICD-10-CM | POA: Diagnosis not present

## 2013-10-03 DIAGNOSIS — R3915 Urgency of urination: Secondary | ICD-10-CM | POA: Diagnosis not present

## 2013-10-03 DIAGNOSIS — R351 Nocturia: Secondary | ICD-10-CM | POA: Diagnosis not present

## 2013-10-20 DIAGNOSIS — N318 Other neuromuscular dysfunction of bladder: Secondary | ICD-10-CM | POA: Diagnosis not present

## 2013-10-20 DIAGNOSIS — R3915 Urgency of urination: Secondary | ICD-10-CM | POA: Diagnosis not present

## 2013-10-20 DIAGNOSIS — N390 Urinary tract infection, site not specified: Secondary | ICD-10-CM | POA: Diagnosis not present

## 2013-10-20 DIAGNOSIS — N3946 Mixed incontinence: Secondary | ICD-10-CM | POA: Diagnosis not present

## 2013-11-20 DIAGNOSIS — N390 Urinary tract infection, site not specified: Secondary | ICD-10-CM | POA: Diagnosis not present

## 2013-11-20 DIAGNOSIS — N3946 Mixed incontinence: Secondary | ICD-10-CM | POA: Diagnosis not present

## 2013-11-27 DIAGNOSIS — N318 Other neuromuscular dysfunction of bladder: Secondary | ICD-10-CM | POA: Diagnosis not present

## 2013-11-27 DIAGNOSIS — R3915 Urgency of urination: Secondary | ICD-10-CM | POA: Diagnosis not present

## 2013-11-27 DIAGNOSIS — N390 Urinary tract infection, site not specified: Secondary | ICD-10-CM | POA: Diagnosis not present

## 2013-11-29 DIAGNOSIS — N390 Urinary tract infection, site not specified: Secondary | ICD-10-CM | POA: Diagnosis not present

## 2013-12-06 DIAGNOSIS — N390 Urinary tract infection, site not specified: Secondary | ICD-10-CM | POA: Diagnosis not present

## 2013-12-06 DIAGNOSIS — N318 Other neuromuscular dysfunction of bladder: Secondary | ICD-10-CM | POA: Diagnosis not present

## 2013-12-08 DIAGNOSIS — N390 Urinary tract infection, site not specified: Secondary | ICD-10-CM | POA: Diagnosis not present

## 2013-12-11 DIAGNOSIS — N390 Urinary tract infection, site not specified: Secondary | ICD-10-CM | POA: Diagnosis not present

## 2013-12-11 DIAGNOSIS — N318 Other neuromuscular dysfunction of bladder: Secondary | ICD-10-CM | POA: Diagnosis not present

## 2013-12-15 DIAGNOSIS — M1710 Unilateral primary osteoarthritis, unspecified knee: Secondary | ICD-10-CM | POA: Diagnosis not present

## 2013-12-18 DIAGNOSIS — N390 Urinary tract infection, site not specified: Secondary | ICD-10-CM | POA: Diagnosis not present

## 2013-12-18 DIAGNOSIS — N318 Other neuromuscular dysfunction of bladder: Secondary | ICD-10-CM | POA: Diagnosis not present

## 2013-12-25 DIAGNOSIS — N318 Other neuromuscular dysfunction of bladder: Secondary | ICD-10-CM | POA: Diagnosis not present

## 2013-12-25 DIAGNOSIS — N390 Urinary tract infection, site not specified: Secondary | ICD-10-CM | POA: Diagnosis not present

## 2013-12-29 DIAGNOSIS — F209 Schizophrenia, unspecified: Secondary | ICD-10-CM | POA: Diagnosis not present

## 2013-12-29 DIAGNOSIS — Z79899 Other long term (current) drug therapy: Secondary | ICD-10-CM | POA: Diagnosis not present

## 2013-12-29 DIAGNOSIS — E7800 Pure hypercholesterolemia, unspecified: Secondary | ICD-10-CM | POA: Diagnosis not present

## 2013-12-29 DIAGNOSIS — I10 Essential (primary) hypertension: Secondary | ICD-10-CM | POA: Diagnosis not present

## 2013-12-29 DIAGNOSIS — H6120 Impacted cerumen, unspecified ear: Secondary | ICD-10-CM | POA: Diagnosis not present

## 2013-12-29 DIAGNOSIS — E119 Type 2 diabetes mellitus without complications: Secondary | ICD-10-CM | POA: Diagnosis not present

## 2013-12-29 DIAGNOSIS — N318 Other neuromuscular dysfunction of bladder: Secondary | ICD-10-CM | POA: Diagnosis not present

## 2014-01-08 DIAGNOSIS — N318 Other neuromuscular dysfunction of bladder: Secondary | ICD-10-CM | POA: Diagnosis not present

## 2014-01-08 DIAGNOSIS — Z1231 Encounter for screening mammogram for malignant neoplasm of breast: Secondary | ICD-10-CM | POA: Diagnosis not present

## 2014-01-15 DIAGNOSIS — N318 Other neuromuscular dysfunction of bladder: Secondary | ICD-10-CM | POA: Diagnosis not present

## 2014-01-26 DIAGNOSIS — F3342 Major depressive disorder, recurrent, in full remission: Secondary | ICD-10-CM | POA: Diagnosis not present

## 2014-02-16 DIAGNOSIS — N318 Other neuromuscular dysfunction of bladder: Secondary | ICD-10-CM | POA: Diagnosis not present

## 2014-02-16 DIAGNOSIS — N390 Urinary tract infection, site not specified: Secondary | ICD-10-CM | POA: Diagnosis not present

## 2014-02-16 DIAGNOSIS — N3946 Mixed incontinence: Secondary | ICD-10-CM | POA: Diagnosis not present

## 2014-02-16 DIAGNOSIS — R3915 Urgency of urination: Secondary | ICD-10-CM | POA: Diagnosis not present

## 2014-02-21 DIAGNOSIS — N390 Urinary tract infection, site not specified: Secondary | ICD-10-CM | POA: Diagnosis not present

## 2014-03-01 DIAGNOSIS — N318 Other neuromuscular dysfunction of bladder: Secondary | ICD-10-CM | POA: Diagnosis not present

## 2014-03-01 DIAGNOSIS — N3946 Mixed incontinence: Secondary | ICD-10-CM | POA: Diagnosis not present

## 2014-03-01 DIAGNOSIS — N390 Urinary tract infection, site not specified: Secondary | ICD-10-CM | POA: Diagnosis not present

## 2014-03-01 DIAGNOSIS — R3915 Urgency of urination: Secondary | ICD-10-CM | POA: Diagnosis not present

## 2014-04-06 DIAGNOSIS — M1710 Unilateral primary osteoarthritis, unspecified knee: Secondary | ICD-10-CM | POA: Diagnosis not present

## 2014-04-06 DIAGNOSIS — E119 Type 2 diabetes mellitus without complications: Secondary | ICD-10-CM | POA: Diagnosis not present

## 2014-04-06 DIAGNOSIS — H2510 Age-related nuclear cataract, unspecified eye: Secondary | ICD-10-CM | POA: Diagnosis not present

## 2014-04-20 DIAGNOSIS — R3915 Urgency of urination: Secondary | ICD-10-CM | POA: Diagnosis not present

## 2014-04-20 DIAGNOSIS — N3946 Mixed incontinence: Secondary | ICD-10-CM | POA: Diagnosis not present

## 2014-04-20 DIAGNOSIS — R351 Nocturia: Secondary | ICD-10-CM | POA: Diagnosis not present

## 2014-04-20 DIAGNOSIS — F3342 Major depressive disorder, recurrent, in full remission: Secondary | ICD-10-CM | POA: Diagnosis not present

## 2014-04-20 DIAGNOSIS — N390 Urinary tract infection, site not specified: Secondary | ICD-10-CM | POA: Diagnosis not present

## 2014-05-04 DIAGNOSIS — Z79899 Other long term (current) drug therapy: Secondary | ICD-10-CM | POA: Diagnosis not present

## 2014-05-04 DIAGNOSIS — Z9181 History of falling: Secondary | ICD-10-CM | POA: Diagnosis not present

## 2014-05-04 DIAGNOSIS — Z1331 Encounter for screening for depression: Secondary | ICD-10-CM | POA: Diagnosis not present

## 2014-05-04 DIAGNOSIS — E119 Type 2 diabetes mellitus without complications: Secondary | ICD-10-CM | POA: Diagnosis not present

## 2014-05-04 DIAGNOSIS — E7800 Pure hypercholesterolemia, unspecified: Secondary | ICD-10-CM | POA: Diagnosis not present

## 2014-06-15 DIAGNOSIS — M1710 Unilateral primary osteoarthritis, unspecified knee: Secondary | ICD-10-CM | POA: Diagnosis not present

## 2014-06-15 DIAGNOSIS — IMO0002 Reserved for concepts with insufficient information to code with codable children: Secondary | ICD-10-CM | POA: Diagnosis not present

## 2014-07-06 DIAGNOSIS — Z0000 Encounter for general adult medical examination without abnormal findings: Secondary | ICD-10-CM | POA: Diagnosis not present

## 2014-07-06 DIAGNOSIS — F209 Schizophrenia, unspecified: Secondary | ICD-10-CM | POA: Diagnosis not present

## 2014-07-06 DIAGNOSIS — I10 Essential (primary) hypertension: Secondary | ICD-10-CM | POA: Diagnosis not present

## 2014-07-06 DIAGNOSIS — E119 Type 2 diabetes mellitus without complications: Secondary | ICD-10-CM | POA: Diagnosis not present

## 2014-07-06 DIAGNOSIS — Z23 Encounter for immunization: Secondary | ICD-10-CM | POA: Diagnosis not present

## 2014-07-27 DIAGNOSIS — N3090 Cystitis, unspecified without hematuria: Secondary | ICD-10-CM | POA: Diagnosis not present

## 2014-07-27 DIAGNOSIS — N3946 Mixed incontinence: Secondary | ICD-10-CM | POA: Diagnosis not present

## 2014-07-27 DIAGNOSIS — N318 Other neuromuscular dysfunction of bladder: Secondary | ICD-10-CM | POA: Diagnosis not present

## 2014-07-27 DIAGNOSIS — R3915 Urgency of urination: Secondary | ICD-10-CM | POA: Diagnosis not present

## 2014-07-27 DIAGNOSIS — R351 Nocturia: Secondary | ICD-10-CM | POA: Diagnosis not present

## 2014-08-10 DIAGNOSIS — F3342 Major depressive disorder, recurrent, in full remission: Secondary | ICD-10-CM | POA: Diagnosis not present

## 2014-08-31 DIAGNOSIS — I10 Essential (primary) hypertension: Secondary | ICD-10-CM | POA: Diagnosis not present

## 2014-09-14 DIAGNOSIS — I10 Essential (primary) hypertension: Secondary | ICD-10-CM | POA: Diagnosis not present

## 2014-11-09 DIAGNOSIS — R634 Abnormal weight loss: Secondary | ICD-10-CM | POA: Diagnosis not present

## 2014-11-09 DIAGNOSIS — F259 Schizoaffective disorder, unspecified: Secondary | ICD-10-CM | POA: Diagnosis not present

## 2014-11-09 DIAGNOSIS — E119 Type 2 diabetes mellitus without complications: Secondary | ICD-10-CM | POA: Diagnosis not present

## 2014-11-09 DIAGNOSIS — M170 Bilateral primary osteoarthritis of knee: Secondary | ICD-10-CM | POA: Diagnosis not present

## 2014-11-09 DIAGNOSIS — M179 Osteoarthritis of knee, unspecified: Secondary | ICD-10-CM | POA: Diagnosis not present

## 2014-11-09 DIAGNOSIS — E780 Pure hypercholesterolemia: Secondary | ICD-10-CM | POA: Diagnosis not present

## 2014-11-09 DIAGNOSIS — I10 Essential (primary) hypertension: Secondary | ICD-10-CM | POA: Diagnosis not present

## 2014-11-16 DIAGNOSIS — F332 Major depressive disorder, recurrent severe without psychotic features: Secondary | ICD-10-CM | POA: Diagnosis not present

## 2014-11-30 DIAGNOSIS — R32 Unspecified urinary incontinence: Secondary | ICD-10-CM | POA: Diagnosis not present

## 2014-11-30 DIAGNOSIS — N390 Urinary tract infection, site not specified: Secondary | ICD-10-CM | POA: Diagnosis not present

## 2014-12-28 DIAGNOSIS — R32 Unspecified urinary incontinence: Secondary | ICD-10-CM | POA: Diagnosis not present

## 2014-12-28 DIAGNOSIS — R809 Proteinuria, unspecified: Secondary | ICD-10-CM | POA: Diagnosis not present

## 2014-12-28 DIAGNOSIS — N390 Urinary tract infection, site not specified: Secondary | ICD-10-CM | POA: Diagnosis not present

## 2015-01-11 DIAGNOSIS — I10 Essential (primary) hypertension: Secondary | ICD-10-CM | POA: Diagnosis not present

## 2015-01-11 DIAGNOSIS — M25572 Pain in left ankle and joints of left foot: Secondary | ICD-10-CM | POA: Diagnosis not present

## 2015-01-11 DIAGNOSIS — N183 Chronic kidney disease, stage 3 (moderate): Secondary | ICD-10-CM | POA: Diagnosis not present

## 2015-01-11 DIAGNOSIS — N3281 Overactive bladder: Secondary | ICD-10-CM | POA: Diagnosis not present

## 2015-01-25 DIAGNOSIS — R809 Proteinuria, unspecified: Secondary | ICD-10-CM | POA: Diagnosis not present

## 2015-01-25 DIAGNOSIS — R32 Unspecified urinary incontinence: Secondary | ICD-10-CM | POA: Diagnosis not present

## 2015-02-08 DIAGNOSIS — F332 Major depressive disorder, recurrent severe without psychotic features: Secondary | ICD-10-CM | POA: Diagnosis not present

## 2015-03-01 DIAGNOSIS — R809 Proteinuria, unspecified: Secondary | ICD-10-CM | POA: Diagnosis not present

## 2015-03-01 DIAGNOSIS — N183 Chronic kidney disease, stage 3 (moderate): Secondary | ICD-10-CM | POA: Diagnosis not present

## 2015-03-01 DIAGNOSIS — I10 Essential (primary) hypertension: Secondary | ICD-10-CM | POA: Diagnosis not present

## 2015-03-08 DIAGNOSIS — N281 Cyst of kidney, acquired: Secondary | ICD-10-CM | POA: Diagnosis not present

## 2015-03-08 DIAGNOSIS — R809 Proteinuria, unspecified: Secondary | ICD-10-CM | POA: Diagnosis not present

## 2015-03-08 DIAGNOSIS — R32 Unspecified urinary incontinence: Secondary | ICD-10-CM | POA: Diagnosis not present

## 2015-03-08 DIAGNOSIS — N183 Chronic kidney disease, stage 3 (moderate): Secondary | ICD-10-CM | POA: Diagnosis not present

## 2015-03-29 DIAGNOSIS — I10 Essential (primary) hypertension: Secondary | ICD-10-CM | POA: Diagnosis not present

## 2015-03-29 DIAGNOSIS — R809 Proteinuria, unspecified: Secondary | ICD-10-CM | POA: Diagnosis not present

## 2015-03-29 DIAGNOSIS — N183 Chronic kidney disease, stage 3 (moderate): Secondary | ICD-10-CM | POA: Diagnosis not present

## 2015-04-05 DIAGNOSIS — M170 Bilateral primary osteoarthritis of knee: Secondary | ICD-10-CM | POA: Diagnosis not present

## 2015-04-19 DIAGNOSIS — N183 Chronic kidney disease, stage 3 (moderate): Secondary | ICD-10-CM | POA: Diagnosis not present

## 2015-04-19 DIAGNOSIS — H539 Unspecified visual disturbance: Secondary | ICD-10-CM | POA: Diagnosis not present

## 2015-04-19 DIAGNOSIS — E780 Pure hypercholesterolemia: Secondary | ICD-10-CM | POA: Diagnosis not present

## 2015-04-19 DIAGNOSIS — I10 Essential (primary) hypertension: Secondary | ICD-10-CM | POA: Diagnosis not present

## 2015-04-19 DIAGNOSIS — Z79899 Other long term (current) drug therapy: Secondary | ICD-10-CM | POA: Diagnosis not present

## 2015-04-19 DIAGNOSIS — E119 Type 2 diabetes mellitus without complications: Secondary | ICD-10-CM | POA: Diagnosis not present

## 2015-04-26 DIAGNOSIS — H43812 Vitreous degeneration, left eye: Secondary | ICD-10-CM | POA: Diagnosis not present

## 2015-04-26 DIAGNOSIS — H2513 Age-related nuclear cataract, bilateral: Secondary | ICD-10-CM | POA: Diagnosis not present

## 2015-04-26 DIAGNOSIS — E119 Type 2 diabetes mellitus without complications: Secondary | ICD-10-CM | POA: Diagnosis not present

## 2015-05-03 DIAGNOSIS — N390 Urinary tract infection, site not specified: Secondary | ICD-10-CM | POA: Diagnosis not present

## 2015-05-03 DIAGNOSIS — N3090 Cystitis, unspecified without hematuria: Secondary | ICD-10-CM | POA: Diagnosis not present

## 2015-05-03 DIAGNOSIS — R32 Unspecified urinary incontinence: Secondary | ICD-10-CM | POA: Diagnosis not present

## 2015-05-03 DIAGNOSIS — F332 Major depressive disorder, recurrent severe without psychotic features: Secondary | ICD-10-CM | POA: Diagnosis not present

## 2015-05-24 DIAGNOSIS — M545 Low back pain: Secondary | ICD-10-CM | POA: Diagnosis not present

## 2015-05-24 DIAGNOSIS — Z9181 History of falling: Secondary | ICD-10-CM | POA: Diagnosis not present

## 2015-05-31 DIAGNOSIS — R300 Dysuria: Secondary | ICD-10-CM | POA: Diagnosis not present

## 2015-05-31 DIAGNOSIS — N3090 Cystitis, unspecified without hematuria: Secondary | ICD-10-CM | POA: Diagnosis not present

## 2015-06-19 DIAGNOSIS — N3090 Cystitis, unspecified without hematuria: Secondary | ICD-10-CM | POA: Diagnosis not present

## 2015-06-19 DIAGNOSIS — N390 Urinary tract infection, site not specified: Secondary | ICD-10-CM | POA: Diagnosis not present

## 2015-07-05 DIAGNOSIS — M545 Low back pain: Secondary | ICD-10-CM | POA: Diagnosis not present

## 2015-07-05 DIAGNOSIS — M170 Bilateral primary osteoarthritis of knee: Secondary | ICD-10-CM | POA: Diagnosis not present

## 2015-07-12 DIAGNOSIS — R32 Unspecified urinary incontinence: Secondary | ICD-10-CM | POA: Diagnosis not present

## 2015-07-12 DIAGNOSIS — N3090 Cystitis, unspecified without hematuria: Secondary | ICD-10-CM | POA: Diagnosis not present

## 2015-07-26 DIAGNOSIS — F332 Major depressive disorder, recurrent severe without psychotic features: Secondary | ICD-10-CM | POA: Diagnosis not present

## 2015-08-02 DIAGNOSIS — Z6826 Body mass index (BMI) 26.0-26.9, adult: Secondary | ICD-10-CM | POA: Diagnosis not present

## 2015-08-02 DIAGNOSIS — M545 Low back pain: Secondary | ICD-10-CM | POA: Diagnosis not present

## 2015-08-02 DIAGNOSIS — Z79899 Other long term (current) drug therapy: Secondary | ICD-10-CM | POA: Diagnosis not present

## 2015-08-02 DIAGNOSIS — H6122 Impacted cerumen, left ear: Secondary | ICD-10-CM | POA: Diagnosis not present

## 2015-08-02 DIAGNOSIS — Z23 Encounter for immunization: Secondary | ICD-10-CM | POA: Diagnosis not present

## 2015-08-02 DIAGNOSIS — I10 Essential (primary) hypertension: Secondary | ICD-10-CM | POA: Diagnosis not present

## 2015-08-02 DIAGNOSIS — M179 Osteoarthritis of knee, unspecified: Secondary | ICD-10-CM | POA: Diagnosis not present

## 2015-08-02 DIAGNOSIS — F259 Schizoaffective disorder, unspecified: Secondary | ICD-10-CM | POA: Diagnosis not present

## 2015-08-02 DIAGNOSIS — E119 Type 2 diabetes mellitus without complications: Secondary | ICD-10-CM | POA: Diagnosis not present

## 2015-08-09 DIAGNOSIS — E871 Hypo-osmolality and hyponatremia: Secondary | ICD-10-CM | POA: Diagnosis not present

## 2015-09-20 DIAGNOSIS — N3090 Cystitis, unspecified without hematuria: Secondary | ICD-10-CM | POA: Diagnosis not present

## 2015-09-20 DIAGNOSIS — R300 Dysuria: Secondary | ICD-10-CM | POA: Diagnosis not present

## 2015-10-25 DIAGNOSIS — F332 Major depressive disorder, recurrent severe without psychotic features: Secondary | ICD-10-CM | POA: Diagnosis not present

## 2015-11-29 DIAGNOSIS — M170 Bilateral primary osteoarthritis of knee: Secondary | ICD-10-CM | POA: Diagnosis not present

## 2015-12-06 DIAGNOSIS — R634 Abnormal weight loss: Secondary | ICD-10-CM | POA: Diagnosis not present

## 2015-12-06 DIAGNOSIS — Z0000 Encounter for general adult medical examination without abnormal findings: Secondary | ICD-10-CM | POA: Diagnosis not present

## 2015-12-06 DIAGNOSIS — N183 Chronic kidney disease, stage 3 (moderate): Secondary | ICD-10-CM | POA: Diagnosis not present

## 2015-12-06 DIAGNOSIS — E7800 Pure hypercholesterolemia, unspecified: Secondary | ICD-10-CM | POA: Diagnosis not present

## 2015-12-06 DIAGNOSIS — E119 Type 2 diabetes mellitus without complications: Secondary | ICD-10-CM | POA: Diagnosis not present

## 2015-12-06 DIAGNOSIS — R05 Cough: Secondary | ICD-10-CM | POA: Diagnosis not present

## 2015-12-06 DIAGNOSIS — Z6824 Body mass index (BMI) 24.0-24.9, adult: Secondary | ICD-10-CM | POA: Diagnosis not present

## 2015-12-06 DIAGNOSIS — M179 Osteoarthritis of knee, unspecified: Secondary | ICD-10-CM | POA: Diagnosis not present

## 2015-12-06 DIAGNOSIS — I10 Essential (primary) hypertension: Secondary | ICD-10-CM | POA: Diagnosis not present

## 2015-12-06 DIAGNOSIS — Z1389 Encounter for screening for other disorder: Secondary | ICD-10-CM | POA: Diagnosis not present

## 2016-01-17 DIAGNOSIS — F332 Major depressive disorder, recurrent severe without psychotic features: Secondary | ICD-10-CM | POA: Diagnosis not present

## 2016-01-21 DIAGNOSIS — F259 Schizoaffective disorder, unspecified: Secondary | ICD-10-CM | POA: Diagnosis not present

## 2016-01-21 DIAGNOSIS — Z6833 Body mass index (BMI) 33.0-33.9, adult: Secondary | ICD-10-CM | POA: Diagnosis not present

## 2016-01-21 DIAGNOSIS — N3281 Overactive bladder: Secondary | ICD-10-CM | POA: Diagnosis not present

## 2016-01-21 DIAGNOSIS — M1991 Primary osteoarthritis, unspecified site: Secondary | ICD-10-CM | POA: Diagnosis not present

## 2016-01-21 DIAGNOSIS — R0602 Shortness of breath: Secondary | ICD-10-CM | POA: Diagnosis not present

## 2016-01-21 DIAGNOSIS — M545 Low back pain: Secondary | ICD-10-CM | POA: Diagnosis not present

## 2016-03-06 DIAGNOSIS — E669 Obesity, unspecified: Secondary | ICD-10-CM | POA: Diagnosis not present

## 2016-03-06 DIAGNOSIS — E119 Type 2 diabetes mellitus without complications: Secondary | ICD-10-CM | POA: Diagnosis not present

## 2016-03-06 DIAGNOSIS — Z79899 Other long term (current) drug therapy: Secondary | ICD-10-CM | POA: Diagnosis not present

## 2016-03-06 DIAGNOSIS — Z6835 Body mass index (BMI) 35.0-35.9, adult: Secondary | ICD-10-CM | POA: Diagnosis not present

## 2016-03-06 DIAGNOSIS — E7800 Pure hypercholesterolemia, unspecified: Secondary | ICD-10-CM | POA: Diagnosis not present

## 2016-03-06 DIAGNOSIS — M1991 Primary osteoarthritis, unspecified site: Secondary | ICD-10-CM | POA: Diagnosis not present

## 2016-03-06 DIAGNOSIS — I10 Essential (primary) hypertension: Secondary | ICD-10-CM | POA: Diagnosis not present

## 2016-04-17 DIAGNOSIS — F332 Major depressive disorder, recurrent severe without psychotic features: Secondary | ICD-10-CM | POA: Diagnosis not present

## 2016-05-06 DIAGNOSIS — Z6838 Body mass index (BMI) 38.0-38.9, adult: Secondary | ICD-10-CM | POA: Diagnosis not present

## 2016-05-06 DIAGNOSIS — K920 Hematemesis: Secondary | ICD-10-CM | POA: Diagnosis not present

## 2016-06-05 DIAGNOSIS — M170 Bilateral primary osteoarthritis of knee: Secondary | ICD-10-CM | POA: Diagnosis not present

## 2016-06-12 DIAGNOSIS — F332 Major depressive disorder, recurrent severe without psychotic features: Secondary | ICD-10-CM | POA: Diagnosis not present

## 2016-07-03 DIAGNOSIS — F332 Major depressive disorder, recurrent severe without psychotic features: Secondary | ICD-10-CM | POA: Diagnosis not present

## 2016-07-07 DIAGNOSIS — Z111 Encounter for screening for respiratory tuberculosis: Secondary | ICD-10-CM | POA: Diagnosis not present

## 2016-07-07 DIAGNOSIS — Z6836 Body mass index (BMI) 36.0-36.9, adult: Secondary | ICD-10-CM | POA: Diagnosis not present

## 2016-07-07 DIAGNOSIS — F259 Schizoaffective disorder, unspecified: Secondary | ICD-10-CM | POA: Diagnosis not present

## 2016-08-14 DIAGNOSIS — Z9181 History of falling: Secondary | ICD-10-CM | POA: Diagnosis not present

## 2016-08-14 DIAGNOSIS — Z79899 Other long term (current) drug therapy: Secondary | ICD-10-CM | POA: Diagnosis not present

## 2016-08-14 DIAGNOSIS — Z23 Encounter for immunization: Secondary | ICD-10-CM | POA: Diagnosis not present

## 2016-08-14 DIAGNOSIS — E119 Type 2 diabetes mellitus without complications: Secondary | ICD-10-CM | POA: Diagnosis not present

## 2016-08-14 DIAGNOSIS — Z6835 Body mass index (BMI) 35.0-35.9, adult: Secondary | ICD-10-CM | POA: Diagnosis not present

## 2016-08-14 DIAGNOSIS — I10 Essential (primary) hypertension: Secondary | ICD-10-CM | POA: Diagnosis not present

## 2016-08-28 DIAGNOSIS — Z6836 Body mass index (BMI) 36.0-36.9, adult: Secondary | ICD-10-CM | POA: Diagnosis not present

## 2016-08-28 DIAGNOSIS — I10 Essential (primary) hypertension: Secondary | ICD-10-CM | POA: Diagnosis not present

## 2016-08-28 DIAGNOSIS — N183 Chronic kidney disease, stage 3 (moderate): Secondary | ICD-10-CM | POA: Diagnosis not present

## 2016-08-28 DIAGNOSIS — F332 Major depressive disorder, recurrent severe without psychotic features: Secondary | ICD-10-CM | POA: Diagnosis not present

## 2016-11-13 DIAGNOSIS — F332 Major depressive disorder, recurrent severe without psychotic features: Secondary | ICD-10-CM | POA: Diagnosis not present

## 2016-11-27 DIAGNOSIS — R609 Edema, unspecified: Secondary | ICD-10-CM | POA: Diagnosis not present

## 2016-11-27 DIAGNOSIS — I10 Essential (primary) hypertension: Secondary | ICD-10-CM | POA: Diagnosis not present

## 2016-11-27 DIAGNOSIS — Z6838 Body mass index (BMI) 38.0-38.9, adult: Secondary | ICD-10-CM | POA: Diagnosis not present

## 2016-11-27 DIAGNOSIS — E119 Type 2 diabetes mellitus without complications: Secondary | ICD-10-CM | POA: Diagnosis not present

## 2016-11-27 DIAGNOSIS — M179 Osteoarthritis of knee, unspecified: Secondary | ICD-10-CM | POA: Diagnosis not present

## 2016-11-27 DIAGNOSIS — Z79899 Other long term (current) drug therapy: Secondary | ICD-10-CM | POA: Diagnosis not present

## 2016-11-27 DIAGNOSIS — F259 Schizoaffective disorder, unspecified: Secondary | ICD-10-CM | POA: Diagnosis not present

## 2016-11-27 DIAGNOSIS — R0602 Shortness of breath: Secondary | ICD-10-CM | POA: Diagnosis not present

## 2016-12-04 DIAGNOSIS — J45901 Unspecified asthma with (acute) exacerbation: Secondary | ICD-10-CM | POA: Diagnosis not present

## 2016-12-04 DIAGNOSIS — Z6839 Body mass index (BMI) 39.0-39.9, adult: Secondary | ICD-10-CM | POA: Diagnosis not present

## 2016-12-04 DIAGNOSIS — J45909 Unspecified asthma, uncomplicated: Secondary | ICD-10-CM | POA: Diagnosis not present

## 2016-12-22 DIAGNOSIS — N183 Chronic kidney disease, stage 3 (moderate): Secondary | ICD-10-CM | POA: Diagnosis not present

## 2016-12-22 DIAGNOSIS — M25562 Pain in left knee: Secondary | ICD-10-CM | POA: Diagnosis not present

## 2016-12-24 DIAGNOSIS — M545 Low back pain: Secondary | ICD-10-CM | POA: Diagnosis not present

## 2016-12-24 DIAGNOSIS — E1122 Type 2 diabetes mellitus with diabetic chronic kidney disease: Secondary | ICD-10-CM | POA: Diagnosis not present

## 2016-12-24 DIAGNOSIS — N183 Chronic kidney disease, stage 3 (moderate): Secondary | ICD-10-CM | POA: Diagnosis not present

## 2016-12-24 DIAGNOSIS — I129 Hypertensive chronic kidney disease with stage 1 through stage 4 chronic kidney disease, or unspecified chronic kidney disease: Secondary | ICD-10-CM | POA: Diagnosis not present

## 2016-12-24 DIAGNOSIS — F209 Schizophrenia, unspecified: Secondary | ICD-10-CM | POA: Diagnosis not present

## 2016-12-24 DIAGNOSIS — E1140 Type 2 diabetes mellitus with diabetic neuropathy, unspecified: Secondary | ICD-10-CM | POA: Diagnosis not present

## 2016-12-25 DIAGNOSIS — M1711 Unilateral primary osteoarthritis, right knee: Secondary | ICD-10-CM | POA: Diagnosis not present

## 2016-12-25 DIAGNOSIS — M1712 Unilateral primary osteoarthritis, left knee: Secondary | ICD-10-CM | POA: Diagnosis not present

## 2016-12-28 DIAGNOSIS — E1122 Type 2 diabetes mellitus with diabetic chronic kidney disease: Secondary | ICD-10-CM | POA: Diagnosis not present

## 2016-12-28 DIAGNOSIS — M545 Low back pain: Secondary | ICD-10-CM | POA: Diagnosis not present

## 2016-12-28 DIAGNOSIS — N183 Chronic kidney disease, stage 3 (moderate): Secondary | ICD-10-CM | POA: Diagnosis not present

## 2016-12-28 DIAGNOSIS — F209 Schizophrenia, unspecified: Secondary | ICD-10-CM | POA: Diagnosis not present

## 2016-12-28 DIAGNOSIS — I129 Hypertensive chronic kidney disease with stage 1 through stage 4 chronic kidney disease, or unspecified chronic kidney disease: Secondary | ICD-10-CM | POA: Diagnosis not present

## 2016-12-28 DIAGNOSIS — E1140 Type 2 diabetes mellitus with diabetic neuropathy, unspecified: Secondary | ICD-10-CM | POA: Diagnosis not present

## 2016-12-29 DIAGNOSIS — E1122 Type 2 diabetes mellitus with diabetic chronic kidney disease: Secondary | ICD-10-CM | POA: Diagnosis not present

## 2016-12-29 DIAGNOSIS — M545 Low back pain: Secondary | ICD-10-CM | POA: Diagnosis not present

## 2016-12-29 DIAGNOSIS — E1140 Type 2 diabetes mellitus with diabetic neuropathy, unspecified: Secondary | ICD-10-CM | POA: Diagnosis not present

## 2016-12-29 DIAGNOSIS — N183 Chronic kidney disease, stage 3 (moderate): Secondary | ICD-10-CM | POA: Diagnosis not present

## 2016-12-29 DIAGNOSIS — I129 Hypertensive chronic kidney disease with stage 1 through stage 4 chronic kidney disease, or unspecified chronic kidney disease: Secondary | ICD-10-CM | POA: Diagnosis not present

## 2016-12-29 DIAGNOSIS — F209 Schizophrenia, unspecified: Secondary | ICD-10-CM | POA: Diagnosis not present

## 2016-12-31 DIAGNOSIS — E1122 Type 2 diabetes mellitus with diabetic chronic kidney disease: Secondary | ICD-10-CM | POA: Diagnosis not present

## 2016-12-31 DIAGNOSIS — I10 Essential (primary) hypertension: Secondary | ICD-10-CM | POA: Diagnosis not present

## 2016-12-31 DIAGNOSIS — F2089 Other schizophrenia: Secondary | ICD-10-CM | POA: Diagnosis not present

## 2016-12-31 DIAGNOSIS — M545 Low back pain: Secondary | ICD-10-CM | POA: Diagnosis not present

## 2016-12-31 DIAGNOSIS — F209 Schizophrenia, unspecified: Secondary | ICD-10-CM | POA: Diagnosis not present

## 2016-12-31 DIAGNOSIS — N183 Chronic kidney disease, stage 3 (moderate): Secondary | ICD-10-CM | POA: Diagnosis not present

## 2016-12-31 DIAGNOSIS — E1140 Type 2 diabetes mellitus with diabetic neuropathy, unspecified: Secondary | ICD-10-CM | POA: Diagnosis not present

## 2016-12-31 DIAGNOSIS — I129 Hypertensive chronic kidney disease with stage 1 through stage 4 chronic kidney disease, or unspecified chronic kidney disease: Secondary | ICD-10-CM | POA: Diagnosis not present

## 2016-12-31 DIAGNOSIS — Z79891 Long term (current) use of opiate analgesic: Secondary | ICD-10-CM | POA: Diagnosis not present

## 2017-01-04 DIAGNOSIS — M545 Low back pain: Secondary | ICD-10-CM | POA: Diagnosis not present

## 2017-01-04 DIAGNOSIS — F064 Anxiety disorder due to known physiological condition: Secondary | ICD-10-CM | POA: Diagnosis not present

## 2017-01-04 DIAGNOSIS — F250 Schizoaffective disorder, bipolar type: Secondary | ICD-10-CM | POA: Diagnosis not present

## 2017-01-04 DIAGNOSIS — E1122 Type 2 diabetes mellitus with diabetic chronic kidney disease: Secondary | ICD-10-CM | POA: Diagnosis not present

## 2017-01-04 DIAGNOSIS — I129 Hypertensive chronic kidney disease with stage 1 through stage 4 chronic kidney disease, or unspecified chronic kidney disease: Secondary | ICD-10-CM | POA: Diagnosis not present

## 2017-01-04 DIAGNOSIS — F209 Schizophrenia, unspecified: Secondary | ICD-10-CM | POA: Diagnosis not present

## 2017-01-04 DIAGNOSIS — E1140 Type 2 diabetes mellitus with diabetic neuropathy, unspecified: Secondary | ICD-10-CM | POA: Diagnosis not present

## 2017-01-04 DIAGNOSIS — N183 Chronic kidney disease, stage 3 (moderate): Secondary | ICD-10-CM | POA: Diagnosis not present

## 2017-01-05 DIAGNOSIS — K5641 Fecal impaction: Secondary | ICD-10-CM | POA: Diagnosis not present

## 2017-01-05 DIAGNOSIS — R109 Unspecified abdominal pain: Secondary | ICD-10-CM | POA: Diagnosis not present

## 2017-01-05 DIAGNOSIS — K5790 Diverticulosis of intestine, part unspecified, without perforation or abscess without bleeding: Secondary | ICD-10-CM | POA: Diagnosis not present

## 2017-01-05 DIAGNOSIS — J90 Pleural effusion, not elsewhere classified: Secondary | ICD-10-CM | POA: Diagnosis not present

## 2017-01-05 DIAGNOSIS — R1032 Left lower quadrant pain: Secondary | ICD-10-CM | POA: Diagnosis not present

## 2017-01-06 DIAGNOSIS — F209 Schizophrenia, unspecified: Secondary | ICD-10-CM | POA: Diagnosis not present

## 2017-01-06 DIAGNOSIS — E1122 Type 2 diabetes mellitus with diabetic chronic kidney disease: Secondary | ICD-10-CM | POA: Diagnosis not present

## 2017-01-06 DIAGNOSIS — F418 Other specified anxiety disorders: Secondary | ICD-10-CM | POA: Diagnosis not present

## 2017-01-06 DIAGNOSIS — M545 Low back pain: Secondary | ICD-10-CM | POA: Diagnosis not present

## 2017-01-06 DIAGNOSIS — I129 Hypertensive chronic kidney disease with stage 1 through stage 4 chronic kidney disease, or unspecified chronic kidney disease: Secondary | ICD-10-CM | POA: Diagnosis not present

## 2017-01-06 DIAGNOSIS — E1140 Type 2 diabetes mellitus with diabetic neuropathy, unspecified: Secondary | ICD-10-CM | POA: Diagnosis not present

## 2017-01-06 DIAGNOSIS — N183 Chronic kidney disease, stage 3 (moderate): Secondary | ICD-10-CM | POA: Diagnosis not present

## 2017-01-11 DIAGNOSIS — R0602 Shortness of breath: Secondary | ICD-10-CM | POA: Diagnosis not present

## 2017-01-11 DIAGNOSIS — R030 Elevated blood-pressure reading, without diagnosis of hypertension: Secondary | ICD-10-CM | POA: Diagnosis not present

## 2017-01-11 DIAGNOSIS — I10 Essential (primary) hypertension: Secondary | ICD-10-CM | POA: Diagnosis not present

## 2017-01-13 DIAGNOSIS — E669 Obesity, unspecified: Secondary | ICD-10-CM | POA: Diagnosis present

## 2017-01-13 DIAGNOSIS — Z9114 Patient's other noncompliance with medication regimen: Secondary | ICD-10-CM | POA: Diagnosis not present

## 2017-01-13 DIAGNOSIS — R3915 Urgency of urination: Secondary | ICD-10-CM | POA: Diagnosis present

## 2017-01-13 DIAGNOSIS — F39 Unspecified mood [affective] disorder: Secondary | ICD-10-CM | POA: Diagnosis present

## 2017-01-13 DIAGNOSIS — I10 Essential (primary) hypertension: Secondary | ICD-10-CM | POA: Diagnosis not present

## 2017-01-13 DIAGNOSIS — G8929 Other chronic pain: Secondary | ICD-10-CM | POA: Diagnosis not present

## 2017-01-13 DIAGNOSIS — F329 Major depressive disorder, single episode, unspecified: Secondary | ICD-10-CM | POA: Diagnosis not present

## 2017-01-13 DIAGNOSIS — N390 Urinary tract infection, site not specified: Secondary | ICD-10-CM | POA: Diagnosis not present

## 2017-01-13 DIAGNOSIS — N39498 Other specified urinary incontinence: Secondary | ICD-10-CM | POA: Diagnosis not present

## 2017-01-13 DIAGNOSIS — E785 Hyperlipidemia, unspecified: Secondary | ICD-10-CM | POA: Diagnosis not present

## 2017-01-13 DIAGNOSIS — R7309 Other abnormal glucose: Secondary | ICD-10-CM | POA: Diagnosis not present

## 2017-01-13 DIAGNOSIS — Z87440 Personal history of urinary (tract) infections: Secondary | ICD-10-CM | POA: Diagnosis not present

## 2017-01-13 DIAGNOSIS — E559 Vitamin D deficiency, unspecified: Secondary | ICD-10-CM | POA: Diagnosis not present

## 2017-01-13 DIAGNOSIS — M545 Low back pain: Secondary | ICD-10-CM | POA: Diagnosis present

## 2017-01-13 DIAGNOSIS — R262 Difficulty in walking, not elsewhere classified: Secondary | ICD-10-CM | POA: Diagnosis not present

## 2017-01-13 DIAGNOSIS — Z79899 Other long term (current) drug therapy: Secondary | ICD-10-CM | POA: Diagnosis not present

## 2017-01-13 DIAGNOSIS — F3340 Major depressive disorder, recurrent, in remission, unspecified: Secondary | ICD-10-CM | POA: Diagnosis not present

## 2017-01-13 DIAGNOSIS — E782 Mixed hyperlipidemia: Secondary | ICD-10-CM | POA: Diagnosis not present

## 2017-01-13 DIAGNOSIS — F819 Developmental disorder of scholastic skills, unspecified: Secondary | ICD-10-CM | POA: Diagnosis present

## 2017-02-07 DIAGNOSIS — F064 Anxiety disorder due to known physiological condition: Secondary | ICD-10-CM | POA: Diagnosis not present

## 2017-02-07 DIAGNOSIS — F250 Schizoaffective disorder, bipolar type: Secondary | ICD-10-CM | POA: Diagnosis not present

## 2017-02-18 DIAGNOSIS — I10 Essential (primary) hypertension: Secondary | ICD-10-CM | POA: Diagnosis not present

## 2017-02-18 DIAGNOSIS — Z6835 Body mass index (BMI) 35.0-35.9, adult: Secondary | ICD-10-CM | POA: Diagnosis not present

## 2017-02-18 DIAGNOSIS — F259 Schizoaffective disorder, unspecified: Secondary | ICD-10-CM | POA: Diagnosis not present

## 2017-02-18 DIAGNOSIS — N183 Chronic kidney disease, stage 3 (moderate): Secondary | ICD-10-CM | POA: Diagnosis not present

## 2017-02-18 DIAGNOSIS — E7800 Pure hypercholesterolemia, unspecified: Secondary | ICD-10-CM | POA: Diagnosis not present

## 2017-02-18 DIAGNOSIS — K219 Gastro-esophageal reflux disease without esophagitis: Secondary | ICD-10-CM | POA: Diagnosis not present

## 2017-02-18 DIAGNOSIS — Z79899 Other long term (current) drug therapy: Secondary | ICD-10-CM | POA: Diagnosis not present

## 2017-02-18 DIAGNOSIS — E669 Obesity, unspecified: Secondary | ICD-10-CM | POA: Diagnosis not present

## 2017-02-18 DIAGNOSIS — E119 Type 2 diabetes mellitus without complications: Secondary | ICD-10-CM | POA: Diagnosis not present

## 2017-02-18 DIAGNOSIS — R609 Edema, unspecified: Secondary | ICD-10-CM | POA: Diagnosis not present

## 2017-02-26 DIAGNOSIS — Z79899 Other long term (current) drug therapy: Secondary | ICD-10-CM | POA: Diagnosis not present

## 2017-02-26 DIAGNOSIS — E119 Type 2 diabetes mellitus without complications: Secondary | ICD-10-CM | POA: Diagnosis not present

## 2017-02-26 DIAGNOSIS — I10 Essential (primary) hypertension: Secondary | ICD-10-CM | POA: Diagnosis not present

## 2017-02-26 DIAGNOSIS — E7800 Pure hypercholesterolemia, unspecified: Secondary | ICD-10-CM | POA: Diagnosis not present

## 2017-02-26 DIAGNOSIS — N183 Chronic kidney disease, stage 3 (moderate): Secondary | ICD-10-CM | POA: Diagnosis not present

## 2017-02-26 DIAGNOSIS — F259 Schizoaffective disorder, unspecified: Secondary | ICD-10-CM | POA: Diagnosis not present

## 2017-02-26 DIAGNOSIS — N3281 Overactive bladder: Secondary | ICD-10-CM | POA: Diagnosis not present

## 2017-03-26 DIAGNOSIS — Z9181 History of falling: Secondary | ICD-10-CM | POA: Diagnosis not present

## 2017-03-26 DIAGNOSIS — E785 Hyperlipidemia, unspecified: Secondary | ICD-10-CM | POA: Diagnosis not present

## 2017-03-26 DIAGNOSIS — Z23 Encounter for immunization: Secondary | ICD-10-CM | POA: Diagnosis not present

## 2017-03-26 DIAGNOSIS — Z0000 Encounter for general adult medical examination without abnormal findings: Secondary | ICD-10-CM | POA: Diagnosis not present

## 2017-03-26 DIAGNOSIS — Z1211 Encounter for screening for malignant neoplasm of colon: Secondary | ICD-10-CM | POA: Diagnosis not present

## 2017-03-26 DIAGNOSIS — Z6836 Body mass index (BMI) 36.0-36.9, adult: Secondary | ICD-10-CM | POA: Diagnosis not present

## 2017-03-26 DIAGNOSIS — Z1389 Encounter for screening for other disorder: Secondary | ICD-10-CM | POA: Diagnosis not present

## 2017-03-26 DIAGNOSIS — Z136 Encounter for screening for cardiovascular disorders: Secondary | ICD-10-CM | POA: Diagnosis not present

## 2017-04-23 ENCOUNTER — Encounter (INDEPENDENT_AMBULATORY_CARE_PROVIDER_SITE_OTHER): Payer: Self-pay | Admitting: Ophthalmology

## 2017-05-07 ENCOUNTER — Encounter (INDEPENDENT_AMBULATORY_CARE_PROVIDER_SITE_OTHER): Payer: Medicare Other | Admitting: Ophthalmology

## 2017-05-07 DIAGNOSIS — H34812 Central retinal vein occlusion, left eye, with macular edema: Secondary | ICD-10-CM

## 2017-05-07 DIAGNOSIS — H43813 Vitreous degeneration, bilateral: Secondary | ICD-10-CM

## 2017-05-07 DIAGNOSIS — H35033 Hypertensive retinopathy, bilateral: Secondary | ICD-10-CM | POA: Diagnosis not present

## 2017-05-07 DIAGNOSIS — I10 Essential (primary) hypertension: Secondary | ICD-10-CM | POA: Diagnosis not present

## 2017-05-07 DIAGNOSIS — H33302 Unspecified retinal break, left eye: Secondary | ICD-10-CM | POA: Diagnosis not present

## 2017-05-21 DIAGNOSIS — H43813 Vitreous degeneration, bilateral: Secondary | ICD-10-CM | POA: Diagnosis not present

## 2017-05-21 DIAGNOSIS — H2513 Age-related nuclear cataract, bilateral: Secondary | ICD-10-CM | POA: Diagnosis not present

## 2017-05-21 DIAGNOSIS — H401133 Primary open-angle glaucoma, bilateral, severe stage: Secondary | ICD-10-CM | POA: Diagnosis not present

## 2017-05-21 DIAGNOSIS — H348122 Central retinal vein occlusion, left eye, stable: Secondary | ICD-10-CM | POA: Diagnosis not present

## 2017-05-28 DIAGNOSIS — F259 Schizoaffective disorder, unspecified: Secondary | ICD-10-CM | POA: Diagnosis not present

## 2017-05-28 DIAGNOSIS — N3281 Overactive bladder: Secondary | ICD-10-CM | POA: Diagnosis not present

## 2017-05-28 DIAGNOSIS — Z6838 Body mass index (BMI) 38.0-38.9, adult: Secondary | ICD-10-CM | POA: Diagnosis not present

## 2017-05-28 DIAGNOSIS — Z23 Encounter for immunization: Secondary | ICD-10-CM | POA: Diagnosis not present

## 2017-05-28 DIAGNOSIS — E119 Type 2 diabetes mellitus without complications: Secondary | ICD-10-CM | POA: Diagnosis not present

## 2017-05-28 DIAGNOSIS — N390 Urinary tract infection, site not specified: Secondary | ICD-10-CM | POA: Diagnosis not present

## 2017-05-28 DIAGNOSIS — I10 Essential (primary) hypertension: Secondary | ICD-10-CM | POA: Diagnosis not present

## 2017-05-28 DIAGNOSIS — N184 Chronic kidney disease, stage 4 (severe): Secondary | ICD-10-CM | POA: Diagnosis not present

## 2017-05-28 DIAGNOSIS — M170 Bilateral primary osteoarthritis of knee: Secondary | ICD-10-CM | POA: Diagnosis not present

## 2017-06-03 DIAGNOSIS — M170 Bilateral primary osteoarthritis of knee: Secondary | ICD-10-CM | POA: Diagnosis not present

## 2017-06-04 ENCOUNTER — Encounter (INDEPENDENT_AMBULATORY_CARE_PROVIDER_SITE_OTHER): Payer: Medicare Other | Admitting: Ophthalmology

## 2017-06-04 DIAGNOSIS — I10 Essential (primary) hypertension: Secondary | ICD-10-CM | POA: Diagnosis not present

## 2017-06-04 DIAGNOSIS — H43813 Vitreous degeneration, bilateral: Secondary | ICD-10-CM

## 2017-06-04 DIAGNOSIS — H34812 Central retinal vein occlusion, left eye, with macular edema: Secondary | ICD-10-CM

## 2017-06-04 DIAGNOSIS — H35033 Hypertensive retinopathy, bilateral: Secondary | ICD-10-CM | POA: Diagnosis not present

## 2017-07-02 ENCOUNTER — Encounter (INDEPENDENT_AMBULATORY_CARE_PROVIDER_SITE_OTHER): Payer: Medicare Other | Admitting: Ophthalmology

## 2017-07-02 DIAGNOSIS — H2513 Age-related nuclear cataract, bilateral: Secondary | ICD-10-CM

## 2017-07-02 DIAGNOSIS — H34812 Central retinal vein occlusion, left eye, with macular edema: Secondary | ICD-10-CM

## 2017-07-02 DIAGNOSIS — H43813 Vitreous degeneration, bilateral: Secondary | ICD-10-CM

## 2017-07-02 DIAGNOSIS — I10 Essential (primary) hypertension: Secondary | ICD-10-CM | POA: Diagnosis not present

## 2017-07-02 DIAGNOSIS — H35033 Hypertensive retinopathy, bilateral: Secondary | ICD-10-CM

## 2017-07-30 ENCOUNTER — Encounter (INDEPENDENT_AMBULATORY_CARE_PROVIDER_SITE_OTHER): Payer: Medicare Other | Admitting: Ophthalmology

## 2017-07-30 DIAGNOSIS — H33302 Unspecified retinal break, left eye: Secondary | ICD-10-CM

## 2017-07-30 DIAGNOSIS — H34812 Central retinal vein occlusion, left eye, with macular edema: Secondary | ICD-10-CM | POA: Diagnosis not present

## 2017-07-30 DIAGNOSIS — H43813 Vitreous degeneration, bilateral: Secondary | ICD-10-CM

## 2017-07-30 DIAGNOSIS — I10 Essential (primary) hypertension: Secondary | ICD-10-CM

## 2017-07-30 DIAGNOSIS — H35033 Hypertensive retinopathy, bilateral: Secondary | ICD-10-CM

## 2017-08-27 DIAGNOSIS — H43813 Vitreous degeneration, bilateral: Secondary | ICD-10-CM | POA: Diagnosis not present

## 2017-08-27 DIAGNOSIS — H401133 Primary open-angle glaucoma, bilateral, severe stage: Secondary | ICD-10-CM | POA: Diagnosis not present

## 2017-08-27 DIAGNOSIS — H348122 Central retinal vein occlusion, left eye, stable: Secondary | ICD-10-CM | POA: Diagnosis not present

## 2017-09-10 ENCOUNTER — Encounter (INDEPENDENT_AMBULATORY_CARE_PROVIDER_SITE_OTHER): Payer: Medicare Other | Admitting: Ophthalmology

## 2017-09-10 DIAGNOSIS — H33302 Unspecified retinal break, left eye: Secondary | ICD-10-CM | POA: Diagnosis not present

## 2017-09-10 DIAGNOSIS — H43813 Vitreous degeneration, bilateral: Secondary | ICD-10-CM | POA: Diagnosis not present

## 2017-09-10 DIAGNOSIS — H2513 Age-related nuclear cataract, bilateral: Secondary | ICD-10-CM | POA: Diagnosis not present

## 2017-09-10 DIAGNOSIS — I10 Essential (primary) hypertension: Secondary | ICD-10-CM | POA: Diagnosis not present

## 2017-09-10 DIAGNOSIS — H34812 Central retinal vein occlusion, left eye, with macular edema: Secondary | ICD-10-CM

## 2017-09-10 DIAGNOSIS — H35033 Hypertensive retinopathy, bilateral: Secondary | ICD-10-CM | POA: Diagnosis not present

## 2017-09-14 DIAGNOSIS — F259 Schizoaffective disorder, unspecified: Secondary | ICD-10-CM | POA: Diagnosis not present

## 2017-09-14 DIAGNOSIS — Z23 Encounter for immunization: Secondary | ICD-10-CM | POA: Diagnosis not present

## 2017-09-14 DIAGNOSIS — M170 Bilateral primary osteoarthritis of knee: Secondary | ICD-10-CM | POA: Diagnosis not present

## 2017-09-14 DIAGNOSIS — I10 Essential (primary) hypertension: Secondary | ICD-10-CM | POA: Diagnosis not present

## 2017-09-14 DIAGNOSIS — N184 Chronic kidney disease, stage 4 (severe): Secondary | ICD-10-CM | POA: Diagnosis not present

## 2017-09-14 DIAGNOSIS — N3281 Overactive bladder: Secondary | ICD-10-CM | POA: Diagnosis not present

## 2017-09-14 DIAGNOSIS — E119 Type 2 diabetes mellitus without complications: Secondary | ICD-10-CM | POA: Diagnosis not present

## 2017-09-14 DIAGNOSIS — E7800 Pure hypercholesterolemia, unspecified: Secondary | ICD-10-CM | POA: Diagnosis not present

## 2017-09-30 DIAGNOSIS — N184 Chronic kidney disease, stage 4 (severe): Secondary | ICD-10-CM | POA: Diagnosis not present

## 2017-10-15 ENCOUNTER — Encounter (INDEPENDENT_AMBULATORY_CARE_PROVIDER_SITE_OTHER): Payer: Medicare Other | Admitting: Ophthalmology

## 2017-10-18 DIAGNOSIS — F259 Schizoaffective disorder, unspecified: Secondary | ICD-10-CM | POA: Diagnosis not present

## 2017-10-18 DIAGNOSIS — M170 Bilateral primary osteoarthritis of knee: Secondary | ICD-10-CM | POA: Diagnosis not present

## 2017-10-18 DIAGNOSIS — Z6839 Body mass index (BMI) 39.0-39.9, adult: Secondary | ICD-10-CM | POA: Diagnosis not present

## 2017-10-20 DIAGNOSIS — N390 Urinary tract infection, site not specified: Secondary | ICD-10-CM | POA: Diagnosis not present

## 2017-10-20 DIAGNOSIS — R32 Unspecified urinary incontinence: Secondary | ICD-10-CM | POA: Diagnosis not present

## 2017-10-29 ENCOUNTER — Encounter (INDEPENDENT_AMBULATORY_CARE_PROVIDER_SITE_OTHER): Payer: Medicare Other | Admitting: Ophthalmology

## 2017-11-05 DIAGNOSIS — N184 Chronic kidney disease, stage 4 (severe): Secondary | ICD-10-CM | POA: Diagnosis not present

## 2017-11-08 ENCOUNTER — Encounter (INDEPENDENT_AMBULATORY_CARE_PROVIDER_SITE_OTHER): Payer: Medicare Other | Admitting: Ophthalmology

## 2017-11-08 DIAGNOSIS — H43813 Vitreous degeneration, bilateral: Secondary | ICD-10-CM

## 2017-11-08 DIAGNOSIS — I10 Essential (primary) hypertension: Secondary | ICD-10-CM | POA: Diagnosis not present

## 2017-11-08 DIAGNOSIS — H34812 Central retinal vein occlusion, left eye, with macular edema: Secondary | ICD-10-CM

## 2017-11-08 DIAGNOSIS — H33302 Unspecified retinal break, left eye: Secondary | ICD-10-CM

## 2017-11-08 DIAGNOSIS — H35033 Hypertensive retinopathy, bilateral: Secondary | ICD-10-CM

## 2017-11-16 DIAGNOSIS — N952 Postmenopausal atrophic vaginitis: Secondary | ICD-10-CM | POA: Diagnosis not present

## 2017-11-16 DIAGNOSIS — N390 Urinary tract infection, site not specified: Secondary | ICD-10-CM | POA: Diagnosis not present

## 2017-11-16 DIAGNOSIS — R32 Unspecified urinary incontinence: Secondary | ICD-10-CM | POA: Diagnosis not present

## 2017-12-06 ENCOUNTER — Encounter (INDEPENDENT_AMBULATORY_CARE_PROVIDER_SITE_OTHER): Payer: 59 | Admitting: Ophthalmology

## 2017-12-06 DIAGNOSIS — H43813 Vitreous degeneration, bilateral: Secondary | ICD-10-CM

## 2017-12-06 DIAGNOSIS — H2513 Age-related nuclear cataract, bilateral: Secondary | ICD-10-CM

## 2017-12-06 DIAGNOSIS — H34812 Central retinal vein occlusion, left eye, with macular edema: Secondary | ICD-10-CM | POA: Diagnosis not present

## 2017-12-06 DIAGNOSIS — H33302 Unspecified retinal break, left eye: Secondary | ICD-10-CM | POA: Diagnosis not present

## 2017-12-06 DIAGNOSIS — I10 Essential (primary) hypertension: Secondary | ICD-10-CM | POA: Diagnosis not present

## 2017-12-06 DIAGNOSIS — H35033 Hypertensive retinopathy, bilateral: Secondary | ICD-10-CM

## 2018-01-10 ENCOUNTER — Encounter (INDEPENDENT_AMBULATORY_CARE_PROVIDER_SITE_OTHER): Payer: 59 | Admitting: Ophthalmology

## 2018-01-10 DIAGNOSIS — H2511 Age-related nuclear cataract, right eye: Secondary | ICD-10-CM | POA: Diagnosis not present

## 2018-01-10 DIAGNOSIS — I10 Essential (primary) hypertension: Secondary | ICD-10-CM | POA: Diagnosis not present

## 2018-01-10 DIAGNOSIS — H34812 Central retinal vein occlusion, left eye, with macular edema: Secondary | ICD-10-CM

## 2018-01-10 DIAGNOSIS — H43813 Vitreous degeneration, bilateral: Secondary | ICD-10-CM | POA: Diagnosis not present

## 2018-01-10 DIAGNOSIS — H353122 Nonexudative age-related macular degeneration, left eye, intermediate dry stage: Secondary | ICD-10-CM | POA: Diagnosis not present

## 2018-01-10 DIAGNOSIS — H35033 Hypertensive retinopathy, bilateral: Secondary | ICD-10-CM

## 2018-02-07 ENCOUNTER — Encounter (INDEPENDENT_AMBULATORY_CARE_PROVIDER_SITE_OTHER): Payer: 59 | Admitting: Ophthalmology

## 2018-02-07 DIAGNOSIS — H34812 Central retinal vein occlusion, left eye, with macular edema: Secondary | ICD-10-CM | POA: Diagnosis not present

## 2018-02-07 DIAGNOSIS — H33302 Unspecified retinal break, left eye: Secondary | ICD-10-CM

## 2018-02-07 DIAGNOSIS — I10 Essential (primary) hypertension: Secondary | ICD-10-CM | POA: Diagnosis not present

## 2018-02-07 DIAGNOSIS — H43813 Vitreous degeneration, bilateral: Secondary | ICD-10-CM | POA: Diagnosis not present

## 2018-02-07 DIAGNOSIS — H35033 Hypertensive retinopathy, bilateral: Secondary | ICD-10-CM | POA: Diagnosis not present

## 2018-02-07 DIAGNOSIS — H4421 Degenerative myopia, right eye: Secondary | ICD-10-CM

## 2018-03-14 ENCOUNTER — Encounter (INDEPENDENT_AMBULATORY_CARE_PROVIDER_SITE_OTHER): Payer: 59 | Admitting: Ophthalmology

## 2018-03-14 DIAGNOSIS — H33302 Unspecified retinal break, left eye: Secondary | ICD-10-CM | POA: Diagnosis not present

## 2018-03-14 DIAGNOSIS — H4423 Degenerative myopia, bilateral: Secondary | ICD-10-CM | POA: Diagnosis not present

## 2018-03-14 DIAGNOSIS — I10 Essential (primary) hypertension: Secondary | ICD-10-CM

## 2018-03-14 DIAGNOSIS — H34812 Central retinal vein occlusion, left eye, with macular edema: Secondary | ICD-10-CM

## 2018-03-14 DIAGNOSIS — H35033 Hypertensive retinopathy, bilateral: Secondary | ICD-10-CM

## 2018-03-14 DIAGNOSIS — H43813 Vitreous degeneration, bilateral: Secondary | ICD-10-CM

## 2018-04-12 ENCOUNTER — Encounter (INDEPENDENT_AMBULATORY_CARE_PROVIDER_SITE_OTHER): Payer: 59 | Admitting: Ophthalmology

## 2018-04-12 DIAGNOSIS — H33302 Unspecified retinal break, left eye: Secondary | ICD-10-CM

## 2018-04-12 DIAGNOSIS — H34812 Central retinal vein occlusion, left eye, with macular edema: Secondary | ICD-10-CM

## 2018-04-12 DIAGNOSIS — I10 Essential (primary) hypertension: Secondary | ICD-10-CM

## 2018-04-12 DIAGNOSIS — H43813 Vitreous degeneration, bilateral: Secondary | ICD-10-CM

## 2018-04-12 DIAGNOSIS — H35033 Hypertensive retinopathy, bilateral: Secondary | ICD-10-CM | POA: Diagnosis not present

## 2018-04-12 DIAGNOSIS — H4423 Degenerative myopia, bilateral: Secondary | ICD-10-CM

## 2018-05-17 ENCOUNTER — Encounter (INDEPENDENT_AMBULATORY_CARE_PROVIDER_SITE_OTHER): Payer: 59 | Admitting: Ophthalmology

## 2018-05-17 DIAGNOSIS — H34812 Central retinal vein occlusion, left eye, with macular edema: Secondary | ICD-10-CM | POA: Diagnosis not present

## 2018-05-17 DIAGNOSIS — H4423 Degenerative myopia, bilateral: Secondary | ICD-10-CM | POA: Diagnosis not present

## 2018-05-17 DIAGNOSIS — H35033 Hypertensive retinopathy, bilateral: Secondary | ICD-10-CM | POA: Diagnosis not present

## 2018-05-17 DIAGNOSIS — H43813 Vitreous degeneration, bilateral: Secondary | ICD-10-CM

## 2018-05-17 DIAGNOSIS — I10 Essential (primary) hypertension: Secondary | ICD-10-CM | POA: Diagnosis not present

## 2018-05-17 DIAGNOSIS — H33302 Unspecified retinal break, left eye: Secondary | ICD-10-CM

## 2018-06-21 ENCOUNTER — Encounter (INDEPENDENT_AMBULATORY_CARE_PROVIDER_SITE_OTHER): Payer: 59 | Admitting: Ophthalmology

## 2018-07-05 ENCOUNTER — Encounter (INDEPENDENT_AMBULATORY_CARE_PROVIDER_SITE_OTHER): Payer: 59 | Admitting: Ophthalmology

## 2018-07-11 ENCOUNTER — Encounter (INDEPENDENT_AMBULATORY_CARE_PROVIDER_SITE_OTHER): Payer: 59 | Admitting: Ophthalmology

## 2018-07-11 DIAGNOSIS — H35033 Hypertensive retinopathy, bilateral: Secondary | ICD-10-CM | POA: Diagnosis not present

## 2018-07-11 DIAGNOSIS — H43813 Vitreous degeneration, bilateral: Secondary | ICD-10-CM

## 2018-07-11 DIAGNOSIS — H33302 Unspecified retinal break, left eye: Secondary | ICD-10-CM

## 2018-07-11 DIAGNOSIS — H34812 Central retinal vein occlusion, left eye, with macular edema: Secondary | ICD-10-CM | POA: Diagnosis not present

## 2018-07-11 DIAGNOSIS — I10 Essential (primary) hypertension: Secondary | ICD-10-CM

## 2018-07-11 DIAGNOSIS — H4423 Degenerative myopia, bilateral: Secondary | ICD-10-CM

## 2018-08-08 ENCOUNTER — Encounter (INDEPENDENT_AMBULATORY_CARE_PROVIDER_SITE_OTHER): Payer: 59 | Admitting: Ophthalmology

## 2018-08-09 ENCOUNTER — Encounter (INDEPENDENT_AMBULATORY_CARE_PROVIDER_SITE_OTHER): Payer: 59 | Admitting: Ophthalmology

## 2018-08-09 DIAGNOSIS — H33302 Unspecified retinal break, left eye: Secondary | ICD-10-CM

## 2018-08-09 DIAGNOSIS — I10 Essential (primary) hypertension: Secondary | ICD-10-CM | POA: Diagnosis not present

## 2018-08-09 DIAGNOSIS — H43813 Vitreous degeneration, bilateral: Secondary | ICD-10-CM | POA: Diagnosis not present

## 2018-08-09 DIAGNOSIS — H35033 Hypertensive retinopathy, bilateral: Secondary | ICD-10-CM

## 2018-08-09 DIAGNOSIS — H34812 Central retinal vein occlusion, left eye, with macular edema: Secondary | ICD-10-CM

## 2018-09-06 ENCOUNTER — Encounter (INDEPENDENT_AMBULATORY_CARE_PROVIDER_SITE_OTHER): Payer: 59 | Admitting: Ophthalmology

## 2018-09-06 DIAGNOSIS — H34812 Central retinal vein occlusion, left eye, with macular edema: Secondary | ICD-10-CM | POA: Diagnosis not present

## 2018-09-06 DIAGNOSIS — H43813 Vitreous degeneration, bilateral: Secondary | ICD-10-CM | POA: Diagnosis not present

## 2018-09-06 DIAGNOSIS — I10 Essential (primary) hypertension: Secondary | ICD-10-CM

## 2018-09-06 DIAGNOSIS — H4423 Degenerative myopia, bilateral: Secondary | ICD-10-CM

## 2018-09-06 DIAGNOSIS — H35033 Hypertensive retinopathy, bilateral: Secondary | ICD-10-CM | POA: Diagnosis not present

## 2018-09-06 DIAGNOSIS — H33302 Unspecified retinal break, left eye: Secondary | ICD-10-CM

## 2018-10-18 ENCOUNTER — Encounter (INDEPENDENT_AMBULATORY_CARE_PROVIDER_SITE_OTHER): Payer: 59 | Admitting: Ophthalmology

## 2018-10-18 DIAGNOSIS — H34812 Central retinal vein occlusion, left eye, with macular edema: Secondary | ICD-10-CM

## 2018-10-18 DIAGNOSIS — H35033 Hypertensive retinopathy, bilateral: Secondary | ICD-10-CM

## 2018-10-18 DIAGNOSIS — H4423 Degenerative myopia, bilateral: Secondary | ICD-10-CM

## 2018-10-18 DIAGNOSIS — I10 Essential (primary) hypertension: Secondary | ICD-10-CM

## 2018-10-18 DIAGNOSIS — H43813 Vitreous degeneration, bilateral: Secondary | ICD-10-CM

## 2018-11-30 ENCOUNTER — Encounter (INDEPENDENT_AMBULATORY_CARE_PROVIDER_SITE_OTHER): Payer: 59 | Admitting: Ophthalmology

## 2018-12-12 ENCOUNTER — Encounter (INDEPENDENT_AMBULATORY_CARE_PROVIDER_SITE_OTHER): Payer: 59 | Admitting: Ophthalmology

## 2018-12-12 DIAGNOSIS — H35033 Hypertensive retinopathy, bilateral: Secondary | ICD-10-CM

## 2018-12-12 DIAGNOSIS — H43813 Vitreous degeneration, bilateral: Secondary | ICD-10-CM | POA: Diagnosis not present

## 2018-12-12 DIAGNOSIS — H4423 Degenerative myopia, bilateral: Secondary | ICD-10-CM | POA: Diagnosis not present

## 2018-12-12 DIAGNOSIS — H34812 Central retinal vein occlusion, left eye, with macular edema: Secondary | ICD-10-CM | POA: Diagnosis not present

## 2018-12-12 DIAGNOSIS — I10 Essential (primary) hypertension: Secondary | ICD-10-CM

## 2019-01-10 ENCOUNTER — Encounter (INDEPENDENT_AMBULATORY_CARE_PROVIDER_SITE_OTHER): Payer: 59 | Admitting: Ophthalmology

## 2019-01-25 ENCOUNTER — Other Ambulatory Visit: Payer: Self-pay

## 2019-01-25 ENCOUNTER — Emergency Department (HOSPITAL_COMMUNITY): Payer: 59

## 2019-01-25 ENCOUNTER — Encounter (HOSPITAL_COMMUNITY): Payer: Self-pay | Admitting: Emergency Medicine

## 2019-01-25 ENCOUNTER — Inpatient Hospital Stay (HOSPITAL_COMMUNITY)
Admission: EM | Admit: 2019-01-25 | Discharge: 2019-01-31 | DRG: 308 | Disposition: A | Discharge status: Home Health | Payer: 59 | Attending: Family Medicine | Admitting: Family Medicine

## 2019-01-25 DIAGNOSIS — J9811 Atelectasis: Secondary | ICD-10-CM | POA: Diagnosis present

## 2019-01-25 DIAGNOSIS — E785 Hyperlipidemia, unspecified: Secondary | ICD-10-CM | POA: Diagnosis present

## 2019-01-25 DIAGNOSIS — J9601 Acute respiratory failure with hypoxia: Secondary | ICD-10-CM | POA: Diagnosis present

## 2019-01-25 DIAGNOSIS — Z905 Acquired absence of kidney: Secondary | ICD-10-CM

## 2019-01-25 DIAGNOSIS — N179 Acute kidney failure, unspecified: Secondary | ICD-10-CM | POA: Diagnosis present

## 2019-01-25 DIAGNOSIS — R0609 Other forms of dyspnea: Secondary | ICD-10-CM | POA: Diagnosis not present

## 2019-01-25 DIAGNOSIS — I4819 Other persistent atrial fibrillation: Secondary | ICD-10-CM | POA: Diagnosis present

## 2019-01-25 DIAGNOSIS — F39 Unspecified mood [affective] disorder: Secondary | ICD-10-CM | POA: Diagnosis present

## 2019-01-25 DIAGNOSIS — E1122 Type 2 diabetes mellitus with diabetic chronic kidney disease: Secondary | ICD-10-CM | POA: Diagnosis present

## 2019-01-25 DIAGNOSIS — I130 Hypertensive heart and chronic kidney disease with heart failure and stage 1 through stage 4 chronic kidney disease, or unspecified chronic kidney disease: Secondary | ICD-10-CM | POA: Diagnosis present

## 2019-01-25 DIAGNOSIS — D631 Anemia in chronic kidney disease: Secondary | ICD-10-CM | POA: Diagnosis present

## 2019-01-25 DIAGNOSIS — M85622 Other cyst of bone, left upper arm: Secondary | ICD-10-CM | POA: Diagnosis present

## 2019-01-25 DIAGNOSIS — N289 Disorder of kidney and ureter, unspecified: Secondary | ICD-10-CM | POA: Diagnosis not present

## 2019-01-25 DIAGNOSIS — Z79891 Long term (current) use of opiate analgesic: Secondary | ICD-10-CM

## 2019-01-25 DIAGNOSIS — I509 Heart failure, unspecified: Secondary | ICD-10-CM

## 2019-01-25 DIAGNOSIS — Z8249 Family history of ischemic heart disease and other diseases of the circulatory system: Secondary | ICD-10-CM | POA: Diagnosis not present

## 2019-01-25 DIAGNOSIS — I10 Essential (primary) hypertension: Secondary | ICD-10-CM | POA: Insufficient documentation

## 2019-01-25 DIAGNOSIS — Z79899 Other long term (current) drug therapy: Secondary | ICD-10-CM | POA: Diagnosis not present

## 2019-01-25 DIAGNOSIS — R0902 Hypoxemia: Secondary | ICD-10-CM

## 2019-01-25 DIAGNOSIS — N184 Chronic kidney disease, stage 4 (severe): Secondary | ICD-10-CM | POA: Diagnosis present

## 2019-01-25 DIAGNOSIS — M170 Bilateral primary osteoarthritis of knee: Secondary | ICD-10-CM | POA: Diagnosis present

## 2019-01-25 DIAGNOSIS — N171 Acute kidney failure with acute cortical necrosis: Secondary | ICD-10-CM | POA: Diagnosis not present

## 2019-01-25 DIAGNOSIS — I4891 Unspecified atrial fibrillation: Secondary | ICD-10-CM

## 2019-01-25 DIAGNOSIS — I5031 Acute diastolic (congestive) heart failure: Secondary | ICD-10-CM | POA: Diagnosis present

## 2019-01-25 DIAGNOSIS — J90 Pleural effusion, not elsewhere classified: Secondary | ICD-10-CM

## 2019-01-25 DIAGNOSIS — R0602 Shortness of breath: Secondary | ICD-10-CM | POA: Diagnosis present

## 2019-01-25 HISTORY — DX: Disorder of kidney and ureter, unspecified: N28.9

## 2019-01-25 HISTORY — DX: Essential (primary) hypertension: I10

## 2019-01-25 LAB — CBC
HCT: 34.6 % — ABNORMAL LOW (ref 36.0–46.0)
Hemoglobin: 10.5 g/dL — ABNORMAL LOW (ref 12.0–15.0)
MCH: 30 pg (ref 26.0–34.0)
MCHC: 30.3 g/dL (ref 30.0–36.0)
MCV: 98.9 fL (ref 80.0–100.0)
Platelets: 298 10*3/uL (ref 150–400)
RBC: 3.5 MIL/uL — ABNORMAL LOW (ref 3.87–5.11)
RDW: 15.1 % (ref 11.5–15.5)
WBC: 10.5 10*3/uL (ref 4.0–10.5)
nRBC: 0 % (ref 0.0–0.2)

## 2019-01-25 LAB — BASIC METABOLIC PANEL
Anion gap: 9 (ref 5–15)
BUN: 26 mg/dL — ABNORMAL HIGH (ref 8–23)
CO2: 20 mmol/L — ABNORMAL LOW (ref 22–32)
Calcium: 9 mg/dL (ref 8.9–10.3)
Chloride: 114 mmol/L — ABNORMAL HIGH (ref 98–111)
Creatinine, Ser: 2.23 mg/dL — ABNORMAL HIGH (ref 0.44–1.00)
GFR calc Af Amer: 25 mL/min — ABNORMAL LOW (ref >60)
GFR calc non Af Amer: 21 mL/min — ABNORMAL LOW (ref >60)
Glucose, Bld: 102 mg/dL — ABNORMAL HIGH (ref 70–99)
Potassium: 4.4 mmol/L (ref 3.5–5.1)
Sodium: 143 mmol/L (ref 135–145)

## 2019-01-25 LAB — HEPATIC FUNCTION PANEL
ALT: 13 U/L (ref 0–44)
AST: 14 U/L — ABNORMAL LOW (ref 15–41)
Albumin: 2.9 g/dL — ABNORMAL LOW (ref 3.5–5.0)
Alkaline Phosphatase: 61 U/L (ref 38–126)
Bilirubin, Direct: <0.1 mg/dL (ref 0.0–0.2)
Total Bilirubin: 0.5 mg/dL (ref 0.3–1.2)
Total Protein: 5.3 g/dL — ABNORMAL LOW (ref 6.5–8.1)

## 2019-01-25 LAB — BRAIN NATRIURETIC PEPTIDE: B Natriuretic Peptide: 1574.7 pg/mL — ABNORMAL HIGH (ref 0.0–100.0)

## 2019-01-25 LAB — I-STAT TROPONIN, ED: Troponin i, poc: 0.03 ng/mL (ref 0.00–0.08)

## 2019-01-25 LAB — TROPONIN I: Troponin I: <0.03 ng/mL (ref <0.03)

## 2019-01-25 MED ORDER — ONDANSETRON HCL 4 MG/2ML IJ SOLN: 4.0000 mg | Freq: Four times a day (QID) | INTRAMUSCULAR | Status: DC | PRN

## 2019-01-25 MED ORDER — SODIUM CHLORIDE 0.9% FLUSH
3.0000 mL | Freq: Once | INTRAVENOUS | Status: AC
  Administered 2019-01-25: 3 mL via INTRAVENOUS

## 2019-01-25 MED ORDER — WARFARIN - PHARMACIST DOSING INPATIENT: Freq: Every day | Status: DC

## 2019-01-25 MED ORDER — FUROSEMIDE 10 MG/ML IJ SOLN
40.0000 mg | Freq: Once | INTRAMUSCULAR | Status: AC
  Administered 2019-01-25: 40 mg via INTRAVENOUS
  Filled 2019-01-25: qty 4

## 2019-01-25 MED ORDER — ACETAMINOPHEN 325 MG PO TABS
650.0000 mg | ORAL_TABLET | ORAL | Status: DC | PRN
  Administered 2019-01-27 – 2019-01-29 (×2): 650 mg via ORAL
  Filled 2019-01-25 (×2): qty 2

## 2019-01-25 MED ORDER — HEPARIN BOLUS VIA INFUSION
4000.0000 [IU] | Freq: Once | INTRAVENOUS | Status: AC
  Administered 2019-01-25: 4000 [IU] via INTRAVENOUS
  Filled 2019-01-25: qty 4000

## 2019-01-25 MED ORDER — FUROSEMIDE 10 MG/ML IJ SOLN
40.0000 mg | Freq: Two times a day (BID) | INTRAMUSCULAR | Status: DC
  Administered 2019-01-26 – 2019-01-27 (×3): 40 mg via INTRAVENOUS
  Filled 2019-01-25 (×3): qty 4

## 2019-01-25 MED ORDER — HEPARIN (PORCINE) 25000 UT/250ML-% IV SOLN
1350.0000 [IU]/h | INTRAVENOUS | Status: DC
  Administered 2019-01-25: 1100 [IU]/h via INTRAVENOUS
  Administered 2019-01-26: 1350 [IU]/h via INTRAVENOUS
  Filled 2019-01-25 (×2): qty 250

## 2019-01-25 MED ORDER — WARFARIN SODIUM 7.5 MG PO TABS: 7.5000 mg | ORAL_TABLET | Freq: Once | ORAL | Status: DC

## 2019-01-25 MED ORDER — DILTIAZEM HCL-DEXTROSE 100-5 MG/100ML-% IV SOLN (PREMIX)
5.0000 mg/h | INTRAVENOUS | Status: DC
  Administered 2019-01-25: 5 mg/h via INTRAVENOUS
  Administered 2019-01-26 – 2019-01-27 (×7): 15 mg/h via INTRAVENOUS
  Administered 2019-01-28: 5 mg/h via INTRAVENOUS
  Filled 2019-01-25 (×9): qty 100

## 2019-01-25 NOTE — ED Notes (Signed)
Patient transported to X-ray 

## 2019-01-25 NOTE — Progress Notes (Signed)
ANTICOAGULATION CONSULT NOTE - Initial Consult  Pharmacy Consult for warfarin>>warfarin Indication: atrial fibrillation  No Known Allergies  Patient Measurements: Height: 5\' 5"  (165.1 cm) Weight: 209 lb 14.1 oz (95.2 kg) IBW/kg (Calculated) : 57  Vital Signs: Temp: 98.1 F (36.7 C) (04/15 1945) Temp Source: Oral (04/15 1945) BP: 136/96 (04/15 1945) Pulse Rate: 91 (04/15 1945)  Labs: Recent Labs    01/25/19 1400  HGB 10.5*  HCT 34.6*  PLT 298  CREATININE 2.23*    Estimated Creatinine Clearance: 26 mL/min (A) (by C-G formula based on SCr of 2.23 mg/dL (H)).   Medical History: Past Medical History:  Diagnosis Date  . Hypertension   . Renal disorder    Assessment: 68 YOF with new onset afib, no anticoagulation PTA, chronic anemia stable.  Pharmacy consulted to initiate warfarin with heparin bridge.   Goal of Therapy:  Heparin level 0.3-0.7 INR 2-3 Monitor platelets by anticoagulation protocol: Yes   Plan:  Heparin 4000 unit bolus followed by 1100 units/hr Warfarin 7.5mg  PO x 1 tonight Daily INR, s/s bleeding  Erin Hearing PharmD., BCPS Clinical Pharmacist 01/25/2019 7:51 PM

## 2019-01-25 NOTE — Consult Note (Signed)
Reason for Consult: Renal insufficiency Referring Physician:  Dr. Fredia Sorrow  Chief Complaint:  Shortness of breath  Assessment/Plan: 1. AKI on  CKD IV with a baseline creatinine which appears to be in the 2.2-2.8 range; I wonder if she may have be hemodiluted in 11/30/2018 (creatinine 2.02) as there was progression of renal dysfunction from 2016-2020. She certainly is volume overloaded on exam. - Lasix 40mg  IV twice daily and will adjust pending response. - She certainly may req in center HD in the future but hopefully we can help delay that by offloading to improve cardiac function. - WIll also check a renal ultrasound (unilateral kidney) to ensure there is no e/o obstruction (low on the differential).  2. Afib RVR on dilt gtt + heparin. Echo requested. 3. HTN 4. B/L knee replacements from OA usually able to use a walker at home. 5. Anemia   HPI: Jaclyn Day is an 73 y.o. female  DM HTN unilateral kidney after prior nephrectomy by Dr. Terance Hart referred to Dr Justin Mend in 2016 with a creatinine of 1.57 01/11/15 3+ proteinuria. Dr. Justin Mend was unable to biopsy bec of the solitary kidney and treated medically with progression of renal disease. Creatinine 2.5 11/05/2017, 2.86 03/28/2018, 2.02 11/30/2018. There was a concern that this may be FSGS but unfortunately unable to biopsy; she was supposed to be treated with Lasix 40mg  daily but did not pick up her prescription yet at the time she saw Dr. Justin Mend on 01/25/2019.   She has already had a SPEP with negative monoclonal spike. She was sent to the ED bec of hypoxia and dyspnea with sats in the 80's. She denies f/cn/v/abd pain/ CP; she also denies having a sore throat. In the ED her sats were 87% RA and noted to be tachypneic with afib started on a dilt gtt. CXR shows b/l pleural effusions, elevated BNP and Cr not far off her baseline.  ROS Pertinent items are noted in HPI.  Chemistry and CBC: Creatinine, Ser  Date/Time Value Ref Range Status   01/25/2019 02:00 PM 2.23 (H) 0.44 - 1.00 mg/dL Final  05/07/2009 03:20 PM 1.60 (H) 0.4 - 1.2 mg/dL Final   Recent Labs  Lab 01/25/19 1400  NA 143  K 4.4  CL 114*  CO2 20*  GLUCOSE 102*  BUN 26*  CREATININE 2.23*  CALCIUM 9.0   Recent Labs  Lab 01/25/19 1400  WBC 10.5  HGB 10.5*  HCT 34.6*  MCV 98.9  PLT 298   Liver Function Tests: Recent Labs  Lab 01/25/19 1433  AST 14*  ALT 13  ALKPHOS 61  BILITOT 0.5  PROT 5.3*  ALBUMIN 2.9*   No results for input(s): LIPASE, AMYLASE in the last 168 hours. No results for input(s): AMMONIA in the last 168 hours. Cardiac Enzymes: No results for input(s): CKTOTAL, CKMB, CKMBINDEX, TROPONINI in the last 168 hours. Iron Studies: No results for input(s): IRON, TIBC, TRANSFERRIN, FERRITIN in the last 72 hours. PT/INR: @LABRCNTIP (inr:5)  Xrays/Other Studies: ) Results for orders placed or performed during the hospital encounter of 01/25/19 (from the past 48 hour(s))  Basic metabolic panel     Status: Abnormal   Collection Time: 01/25/19  2:00 PM  Result Value Ref Range   Sodium 143 135 - 145 mmol/L   Potassium 4.4 3.5 - 5.1 mmol/L   Chloride 114 (H) 98 - 111 mmol/L   CO2 20 (L) 22 - 32 mmol/L   Glucose, Bld 102 (H) 70 - 99 mg/dL   BUN  26 (H) 8 - 23 mg/dL   Creatinine, Ser 2.23 (H) 0.44 - 1.00 mg/dL   Calcium 9.0 8.9 - 10.3 mg/dL   GFR calc non Af Amer 21 (L) >60 mL/min   GFR calc Af Amer 25 (L) >60 mL/min   Anion gap 9 5 - 15    Comment: Performed at [facility] [address], [place], [place] [address]  CBC     Status: Abnormal   Collection Time: 01/25/19  2:00 PM  Result Value Ref Range   WBC 10.5 4.0 - 10.5 K/uL   RBC 3.50 (L) 3.87 - 5.11 MIL/uL   Hemoglobin 10.5 (L) 12.0 - 15.0 g/dL   HCT 34.6 (L) 36.0 - 46.0 %   MCV 98.9 80.0 - 100.0 fL   MCH 30.0 26.0 - 34.0 pg   MCHC 30.3 30.0 - 36.0 g/dL   RDW 15.1 11.5 - 15.5 %   Platelets 298 150 - 400 K/uL   nRBC 0.0 0.0 - 0.2 %    Comment: Performed at [facility], [place] [address], [place], [place] [address]  Brain natriuretic peptide     Status: Abnormal   Collection Time: 01/25/19  2:33 PM  Result Value Ref Range   B Natriuretic Peptide 1,574.7 (H) 0.0 - 100.0 pg/mL    Comment: Performed at [facility] [address] [place], [place] [address]  Hepatic function panel     Status: Abnormal   Collection Time: 01/25/19  2:33 PM  Result Value Ref Range   Total Protein 5.3 (L) 6.5 - 8.1 g/dL   Albumin 2.9 (L) 3.5 - 5.0 g/dL   AST 14 (L) 15 - 41 U/L   ALT 13 0 - 44 U/L   Alkaline Phosphatase 61 38 - 126 U/L   Total Bilirubin 0.5 0.3 - 1.2 mg/dL   Bilirubin, Direct <0.1 0.0 - 0.2 mg/dL   Indirect Bilirubin NOT CALCULATED 0.3 - 0.9 mg/dL    Comment: Performed at [facility] [address] [place], [place] [address]  I-Stat Troponin, ED (not at [facility])     Status: None   Collection Time: 01/25/19  2:37 PM  Result Value Ref Range   Troponin i, poc 0.03 0.00 - 0.08 ng/mL   Comment 3            Comment: Due to the release kinetics of cTnI, a negative result within the first hours of the onset of symptoms does not rule out myocardial infarction with certainty. If myocardial infarction is still suspected, repeat the test at appropriate intervals.    Dg Chest 2 View  Result Date: 01/25/2019 CLINICAL DATA:  73 year old female with chest pain EXAM: CHEST - 2 VIEW COMPARISON:  05/07/2009, 09/24/2008. FINDINGS: Cardiomediastinal silhouette unchanged in size and contour. New opacity at the right lung base with obscuration of the right hemidiaphragm and the right heart border. Opacity at the left lung base with blunting of left costophrenic angle. Lateral view demonstrates meniscus in the costophrenic sulcus. No pneumothorax.  Interlobular septal thickening. IMPRESSION: Bilateral pleural effusions with associated atelectasis/consolidation. Electronically Signed   By: Corrie Mckusick D.O.   On: 01/25/2019 15:33    PMH:   Past Medical  History:  Diagnosis Date  . Hypertension   . Renal disorder     PSH:  History reviewed. No pertinent surgical history.  Allergies: No Known Allergies  Medications:   Prior to Admission medications   Not on File  Discontinued Meds:   Medications Discontinued During This Encounter  Medication Reason  . Warfarin - Pharmacist Dosing Inpatient   . warfarin (COUMADIN) tablet 7.5 mg     Social History:  reports that she has never smoked. She has never used smokeless tobacco. She reports previous alcohol use. She reports previous drug use.  Family History:   Family History  Problem Relation Age of Onset  . Atrial fibrillation Father   . Atrial fibrillation Brother     Blood pressure (!) 136/96, pulse 91, temperature 98.1 F (36.7 C), temperature source Oral, resp. rate 20, height 5\' 5"  (1.651 m), weight 95.2 kg, SpO2 92 %. General appearance: alert, cooperative and appears stated age Head: Normocephalic, without obvious abnormality, atraumatic Eyes: negative Neck: JVD - 8 cm above sternal notch, no adenopathy, no carotid bruit, supple, symmetrical, trachea midline and thyroid not enlarged, symmetric, no tenderness/mass/nodules Back: symmetric, no curvature. ROM normal. No CVA tenderness. Resp: rales bibasilar and bilaterally Chest wall: no tenderness Cardio: irreg irreg GI: soft, non-tender; bowel sounds normal; no masses,  no organomegaly Extremities: edema 2+ Pulses: 2+ and symmetric Skin: Skin color, texture, turgor normal. No rashes or lesions       Tashaun Obey, Hunt Oris, MD 01/25/2019, 8:50 PM

## 2019-01-25 NOTE — Progress Notes (Signed)
ANTICOAGULATION CONSULT NOTE - Initial Consult  Pharmacy Consult for warfarin Indication: atrial fibrillation  No Known Allergies  Patient Measurements: Height: 5' 5.5" (166.4 cm) Weight: 204 lb (92.5 kg) IBW/kg (Calculated) : 58.15  Vital Signs: Temp: 97.8 F (36.6 C) (04/15 1414) Temp Source: Oral (04/15 1414) BP: 154/104 (04/15 1645) Pulse Rate: 104 (04/15 1645)  Labs: Recent Labs    01/25/19 1400  HGB 10.5*  HCT 34.6*  PLT 298  CREATININE 2.23*    Estimated Creatinine Clearance: 25.9 mL/min (A) (by C-G formula based on SCr of 2.23 mg/dL (H)).   Medical History: Past Medical History:  Diagnosis Date  . Hypertension   . Renal disorder    Assessment: 60 YOF with new onset afib, no anticoagulation PTA, chronic anemia stable.  Pharmacy consulted to initiate warfarin.    Goal of Therapy:  INR 2-3 Monitor platelets by anticoagulation protocol: Yes   Plan:  Warfarin 7.5mg  PO x 1 tonight Daily INR, s/s bleeding  Bertis Ruddy, PharmD Clinical Pharmacist Please check AMION for all [facility] numbers 01/25/2019 5:42 PM

## 2019-01-25 NOTE — ED Notes (Signed)
Admitting physician at bedside

## 2019-01-25 NOTE — ED Notes (Signed)
Attempted to call report to [phone] 

## 2019-01-25 NOTE — ED Provider Notes (Signed)
[facility] EMERGENCY DEPARTMENT Provider Note   CSN: [id number] Arrival date & time: 01/25/19  1342    History   Chief Complaint Chief Complaint  Patient presents with  . Shortness of Breath  . Chest Pain  . Atrial Fibrillation    HPI Jaclyn Day is a 73 y.o. female.     Patient sent in by Dr. Justin Mend from nephrology office for hypoxia.  Patient was satting on room air in the 80 percentile range.  Upon arrival here patient sats were 87% on room air.  Patient was placed on 2 L of oxygen sats are up in the 90s.  Patient describes some chest pressure associated with this shortness of breath.  Denies any radiation of the pain pains anterior part of the chest.  Her primary care doctor is in the [place] area.  She is followed by Dr. Justin Mend for chronic renal disease.  Patient denies any fevers.  Any cough or congestion.  Past medical history significant for hypertension and renal disorder only.  Patient never smoked.     Past Medical History:  Diagnosis Date  . Hypertension   . Renal disorder     There are no active problems to display for this patient.   History reviewed. No pertinent surgical history.   OB History   No obstetric history on file.      Home Medications    Prior to Admission medications   Not on File    Family History History reviewed. No pertinent family history.  Social History Social History   Tobacco Use  . Smoking status: Never Smoker  . Smokeless tobacco: Never Used  Substance Use Topics  . Alcohol use: Not Currently  . Drug use: Not Currently     Allergies   Patient has no known allergies.   Review of Systems Review of Systems  Constitutional: Negative for chills and fever.  HENT: Negative for rhinorrhea and sore throat.   Eyes: Negative for visual disturbance.  Respiratory: Positive for shortness of breath. Negative for cough.   Cardiovascular: Positive for chest pain. Negative for leg swelling.   Gastrointestinal: Negative for abdominal pain, diarrhea, nausea and vomiting.  Genitourinary: Negative for dysuria.  Musculoskeletal: Negative for back pain and neck pain.  Skin: Negative for rash.  Neurological: Negative for dizziness, light-headedness and headaches.  Hematological: Does not bruise/bleed easily.  Psychiatric/Behavioral: Negative for confusion.     Physical Exam Updated Vital Signs BP (!) 147/114   Pulse (!) 51   Temp 97.8 F (36.6 C) (Oral)   Resp (!) 26   Ht 1.664 m (5' 5.5")   Wt 92.5 kg   SpO2 97%   BMI 33.43 kg/m   Physical Exam Vitals signs and nursing note reviewed.  Constitutional:      General: She is not in acute distress.    Appearance: Normal appearance. She is well-developed.  HENT:     Head: Normocephalic and atraumatic.  Eyes:     Extraocular Movements: Extraocular movements intact.     Conjunctiva/sclera: Conjunctivae normal.     Pupils: Pupils are equal, round, and reactive to light.  Neck:     Musculoskeletal: Neck supple.  Cardiovascular:     Rate and Rhythm: Normal rate and regular rhythm.     Heart sounds: No murmur.  Pulmonary:     Effort: Pulmonary effort is normal. No respiratory distress.     Breath sounds: Normal breath sounds. No wheezing or rhonchi.  Abdominal:  General: Bowel sounds are normal.     Palpations: Abdomen is soft.     Tenderness: There is no abdominal tenderness.  Musculoskeletal: Normal range of motion.     Comments: Slight lower extremity swelling.  Skin:    General: Skin is warm and dry.  Neurological:     General: No focal deficit present.     Mental Status: She is alert and oriented to person, place, and time.      ED Treatments / Results  Labs (all labs ordered are listed, but only abnormal results are displayed) Labs Reviewed  BASIC METABOLIC PANEL - Abnormal; Notable for the following components:      Result Value   Chloride 114 (*)    CO2 20 (*)    Glucose, Bld 102 (*)    BUN 26  (*)    Creatinine, Ser 2.23 (*)    GFR calc non Af Amer 21 (*)    GFR calc Af Amer 25 (*)    All other components within normal limits  CBC - Abnormal; Notable for the following components:   RBC 3.50 (*)    Hemoglobin 10.5 (*)    HCT 34.6 (*)    All other components within normal limits  BRAIN NATRIURETIC PEPTIDE - Abnormal; Notable for the following components:   B Natriuretic Peptide 1,574.7 (*)    All other components within normal limits  HEPATIC FUNCTION PANEL - Abnormal; Notable for the following components:   Total Protein 5.3 (*)    Albumin 2.9 (*)    AST 14 (*)    All other components within normal limits  I-STAT TROPONIN, ED    EKG EKG Interpretation  Date/Time:  Wednesday January 25 2019 13:51:24 EDT Ventricular Rate:  132 PR Interval:    QRS Duration: 80 QT Interval:  306 QTC Calculation: 453 R Axis:   84 Text Interpretation:  Atrial fibrillation with rapid ventricular response ST & T wave abnormality, consider inferior ischemia Abnormal ECG Confirmed by Isla Pence [phone]) on 01/25/2019 1:53:49 PM Also confirmed by Fredia Sorrow [phone])  on 01/25/2019 2:15:10 PM   Radiology Dg Chest 2 View  Result Date: 01/25/2019 CLINICAL DATA:  73 year old female with chest pain EXAM: CHEST - 2 VIEW COMPARISON:  05/07/2009, 09/24/2008. FINDINGS: Cardiomediastinal silhouette unchanged in size and contour. New opacity at the right lung base with obscuration of the right hemidiaphragm and the right heart border. Opacity at the left lung base with blunting of left costophrenic angle. Lateral view demonstrates meniscus in the costophrenic sulcus. No pneumothorax.  Interlobular septal thickening. IMPRESSION: Bilateral pleural effusions with associated atelectasis/consolidation. Electronically Signed   By: Corrie Mckusick D.O.   On: 01/25/2019 15:33    Procedures Procedures (including critical care time)  CRITICAL CARE Performed by: Fredia Sorrow Total critical care time: 30  minutes Critical care time was exclusive of separately billable procedures and treating other patients. Critical care was necessary to treat or prevent imminent or life-threatening deterioration. Critical care was time spent personally by me on the following activities: development of treatment plan with patient and/or surrogate as well as nursing, discussions with consultants, evaluation of patient's response to treatment, examination of patient, obtaining history from patient or surrogate, ordering and performing treatments and interventions, ordering and review of laboratory studies, ordering and review of radiographic studies, pulse oximetry and re-evaluation of patient's condition.   Medications Ordered in ED Medications  diltiazem (CARDIZEM) 100 mg in dextrose 5% 137mL (1 mg/mL) infusion (has no administration in time  range)  sodium chloride flush (NS) 0.9 % injection 3 mL (3 mLs Intravenous Given 01/25/19 1421)     Initial Impression / Assessment and Plan / ED Course  I have reviewed the triage vital signs and the nursing notes.  Pertinent labs & imaging results that were available during my care of the patient were reviewed by me and considered in my medical decision making (see chart for details).       Patient on the oxygen in no acute distress.  Oxygen sats are normal.  Chest x-ray shows bilateral pleural effusions.  Associated with atelectasis and consolidation.  Patient without any fevers.  Patient heart rate tachycardia.  Most likely atrial fibrillation.  With rapid ventricular response.  BNP markedly elevated at 1500.  Renal function slightly worse than normal but patient certainly has had renal insufficiency in the past.  Potassium is fine.   Patient's heart rate currently on the monitor is anywhere from 100 244 bpm consistent with atrial fibrillation.  Will start patient on diltiazem drip and not give a bolus because she is really waxing and waning with the rate and that way  they can titrated in.  BNP markedly elevated is always possible some underlying CHF or this just could be due to the fact she been in atrial fib for so long that fluids have built up she does have bilateral leg swelling.  Discussed with family medicine for an unassigned admission and they will admit.  Do have a call into nephrology since they follow her.  And her renal function appears that it could be worse but our comparison is from 2010.  Potassium is fine.   Final Clinical Impressions(s) / ED Diagnoses   Final diagnoses:  Hypoxia  Chronic bilateral pleural effusions  Atrial fibrillation with RVR [facility])    ED Discharge Orders    None       Fredia Sorrow, MD 01/25/19 1559

## 2019-01-25 NOTE — ED Triage Notes (Addendum)
Pt with reports of chest pressure in her heart along with shortness of breath, denies radiation. She normally receives care at [facility] and sees a kidney specialist Dr. Justin Mend. Pt is a/o at triage NAD.

## 2019-01-25 NOTE — H&P (Addendum)
[facility] Admission History and Physical Service Pager: [phone]  Patient name: Jaclyn Day Medical record number: [id number] Date of birth: [birth year] Age: 73 y.o. Gender: female  Primary Care Provider: Cyndi Bender, PA-C Consultants: Nephrology Code Status: Full Code   Chief Complaint: Shortness of Breath; Chest Pain; and Atrial Fibrillation 01/25/19 Admitted, dilt gtt started, nephrology consulted   Assessment and Plan: Jaclyn Day is a 73 y.o. female admitted for hypoxia and diagnosed with new onset Afib. Also found to have possible HF with BNP 1574.7 w/ orthopnea and DOE.  Her chronic conditions include CKD (unilateral kidney- congenital), HTN, CHF.   # Acute Hypoxemic Respiratory failure 2/2 New A Fib w/ RVR w/ resulting acute HF  SOB, orthopnea x 1 week and new bilateral leg swelling for the last few days, found to be hypoxic to mid 80's in nephrology clinic today and sent to ED. Improved with 2L [facility] and speaking in full sentences. Patient able to speak long sentences in full volume without distress. No recorded desats in clinic and O2 sat of 97% while on 2L O2. She remains tachypniec in the 20's.  Most likely cause of shortness of breath would be new onset A. fib with RVR resulting in an acute heart failure exacerbation due to decreased cardiac output. Patient reports symptoms of chest pain and SOB x1 week so likely she has been in Afib for some time now. It is unclear if patient has chronic heart failure at baseline, as we do not have any of her records, but she does not report any history. Physical exam s/f BLEE and JVD to mandible while patient 90 degrees. BNP was 1574.7 with no previous BNPs in the record. Chest x-ray with bilateral pulmonary edema.  Likely that new onset Afib has thrown patient into CHF exacerbation. Unclear dry weight at this time, no records for comparison. Differential should also include pulmonary embolus in the setting of  chest pain and shortness of breath; however, Well's score 0.  Can consider further testing for PE if no improvement with treatment of Afib and CHF exacerbation; will try to avoid CTA given poor renal function. Additionally, despite new oxygen requirement, low concern for COVID-19 as patient has not had fever, cough, sick contacts. Patient was also found to have hemoglobin of 10.5, unclear if acute or chronic. This could also be contributing to hypoxia.  Can also consider pneumonia, as it cannot be ruled out in setting of bilateral pulmonary edema on chest x-ray, however, less likely as patient does not have fever, cough, white count.  Patient was started on diltiazem drip in the ED and heart rates have remained in the 90s, down from 120s on arrival to ED.  Plan to obtain echo (no previous Echo results in chart), diltiazem drip (discontinue if echocardiogram with low ejection fraction) and will start diuresis with IV Lasix 40 mg and follow response. No medications listed in chart, however, patient does not report Lasix at home.  Consulted nephrology in setting of renal insufficiency and unilateral kidney, who recommended diuresis for volume overload at this time.  Will follow pulmonary edema and echocardiogram for heart failure     Admit to FMTS, attending Dr. Ardelia Mems. Level of care: Progressive .   Out of bed with assistance only  Vitals per unit routine . O2 PRN w/ pulse ox, wean as tolerated . IV laxis 40 mg x 1 and follow response . Strict IO   . Daily weights . Fluid restriction 1200  mL  Echocardiogram  If no improvement with Lasix and rate control, can consider consulting cardiology  Manage A. fib as below  If no improvement can consider PE w/u with V/Q scan  Will attempt to obtain records in AM (unable to obtain records today as PCP office is already closed)  #Afib w/ RVR, new   Patient's [place] Vasc score is 3.  HR  Max to 134 in ED and averaging around low 100's. Down to 63 at 1800.  Will start with heparin overnight. Unclear inciting event, however patient does have strong family history. Will start patient on heparin overnight and will discuss further anticoagulation options with her in the morning. Have discussed this plan directly with pharmacist on call Erin Hearing who was made aware of heparin gtt overnight plan.   Continue diltiazem drip  Start heparin drip per pharm  Echocardiogram  Discuss anticoagulation options with patient in the morning, warfarin versus DOAC  AM EKG   #Chest Pain  No known history of MI.  ST and T wave abnormalities on EKG read however reviewed by 2 ED physicians prior to admission and not read as STEMI; troponin negative making ischemia less likely. No previous EKGs for comparison. Can consider ischemia, but more likely due to a demand ischemia in setting of fast heart beat x 1 week and new afib. Could be MSK due to tachypnea and increased work of breathing x1 week.  If continues, repeat EKG and repeat troponin, monitor improvement with nitro. Monitor for resolution with diuresis and rate control.  Will obtain risk stratifying labs.  Monitor for CP, if continues, consult cardiology for ischemic work up   PRN nitro  A1c & Lipid panel  Trend troponins  AM EKG  Echo  # Renal insuff in setting of unilateral kidney Patient [facility]  Her creatinine is 2.23 on admission, potassium and sodium within normal limits.  GFR is 21.  Previous values in July 2010: Creatinine 1.6 and GFR 33.  Per nephrology consult note, baseline Cr ~2.2-2.8, progression of renal dysfunction from 2016-2020. Patient reports that Dr. Justin Mend would ultimately consider dialysis for her kidney disease. Geanie Cooley nephrology, to see patient tonight and provide recommendations . Call [facility] for records . IV 40 mg x 1 and follow response, per nephrology would like to increase to 40mg  IV bid  . Avoid nephrotoxic  medications  # HTN Unclear if treated at home currently.  Ranging from 625W to 389 systolic in the ED.  Expect hypertension to improve with diuresis and diltiazem drip.  We will follow-up with records in the morning and ask patient more about home medication as no med rec from ED. . Follow-up med rec . Continue to monitor . Continue dilt gtt and lasix as above . Call Dr. Vista Lawman, her PCP at [phone] to obtain records  # B/L OA  Walks with assisted device at home, will use wheelchair whenever she leaves the house. Patient is a fall risk, and especially more worrisome in the setting of new anticoagulation.  Patient reports that she is going to need bilateral knee replacements per previous work-ups. . Fall precautions in setting of Warfarin  . Out of bed with assistance . Avoid NSAIDs  # Anemia, MCV 98.9  Unknown if acute or chronic. Likely 2/2 CKD. Can be contributing to SOB. MCV at upper limit of normal.  . Trend Hgb  . Determine if acute or chronic via records . Obtain B12 and folate    #  Bony Cyst, left arm  Patient reports that this is currently being worked up by her PCP.  She does not have any new complaints today.  We will continue to follow symptoms but will not formally address unless acute findings.  follow-up outpatient  #FEN/GI:  . Electrolytes: Monitor with Lasix . Nutrition: Heart healthy, fluid restriction 1200 mL  VTE prophylaxis: Heparin per pharmacy consult  Disposition: Admit to Progressive in setting of diltiazem drip.   History of the Present Illness  AMYRI FRENZ is a 73 y.o. female with past medical history significant for unilateral kidney with renal insufficiency (followed by Dr. Justin Mend, who presents with SOB x 1 week and found to be hypoxic to mid 80's at nephrologist office today.  Also having mild anterior CP without radiation for a few days. Patient reports swollen legs bilaterally and attempted compression stockings without improvement. She reports  SOB started about a week ago with waking up at night due to shortness of breath. She has had DOE and swelling of legs.  Patient denies any COVID contacts recently. Denies fevers. She lives at home with her boyfriend. She follows with Dr. Lanny Hurst PCP in [place].   In the ED, sats 87% on RA, on 2L in mid 90s. Tachypenic to 23-25. A fib on EKG and started on diltiazem drip in setting  HR 126 of hypertensive readings. CXR BL pleural effusions, cannot r/o consolidations. BUN and Cr worse from baseline (which is from July 2010). H/H 105/34.6, BNP 1574.7. Nephrology consulted   Review Of Systems: Review of Systems  Constitutional: Negative for chills, fever and weight loss.  HENT: Negative for congestion, hearing loss, sinus pain and sore throat.   Eyes: Negative for blurred vision.  Respiratory: Positive for shortness of breath. Negative for cough and hemoptysis.   Cardiovascular: Positive for chest pain, orthopnea, leg swelling and PND.  Gastrointestinal: Negative for abdominal pain, diarrhea, heartburn, nausea and vomiting.  Genitourinary: Negative for dysuria, frequency and urgency.  Musculoskeletal: Negative for falls.  Skin: Negative for itching and rash.  Neurological: Negative for dizziness, sensory change, speech change, focal weakness and headaches.  Endo/Heme/Allergies: Negative for environmental allergies. Does not bruise/bleed easily.  Psychiatric/Behavioral: Negative.    Patient Active Problem List   Diagnosis Date Noted  . Atrial fibrillation with RVR ([place]) 01/25/2019  . CHF (congestive heart failure) ([place]) 01/25/2019  . Hypoxia   . Renal insufficiency   . Renal disorder   . Hypertension    Past Medical History: Past Medical History:  Diagnosis Date  . Hypertension   . Renal disorder    Past Surgical History: History reviewed. No pertinent surgical history.  Family History: family history includes Atrial fibrillation in her brother and father.   Social History: Social  History   Social History Narrative   Lives with her boyfriend in [place]. He is her POA.     Kamelia reports that she has never smoked. She has never used smokeless tobacco. She reports previous alcohol use. She reports previous drug use.  Allergies and Medications: No Known Allergies No outpatient medications have been marked as taking for the 01/25/19 encounter [facility] Encounter).   Objective: BP (!) 136/96 Comment (BP Location): left forearm  Pulse 91   Temp 98.1 F (36.7 C) (Oral)   Resp 20   Ht 5\' 5"  (1.651 m)   Wt 95.2 kg   SpO2 92%   BMI 34.93 kg/m  Filed Weights   01/25/19 1354 01/25/19 1415 01/25/19 1834  Weight: 92.5 kg  92.5 kg 95.2 kg   Exam: Physical Exam Vitals signs and nursing note reviewed.  Constitutional:      General: She is not in acute distress.    Appearance: She is obese. She is not ill-appearing, toxic-appearing or diaphoretic.  HENT:     Head: Normocephalic and atraumatic.  Eyes:     Extraocular Movements: Extraocular movements intact.     Pupils: Pupils are equal, round, and reactive to light.  Neck:     Musculoskeletal: Normal range of motion and neck supple.     Vascular: JVD present.  Cardiovascular:     Rate and Rhythm: Tachycardia present. Rhythm irregular.  Pulmonary:     Effort: Pulmonary effort is normal. Tachypnea present. No accessory muscle usage.     Breath sounds: Examination of the right-lower field reveals rales. Examination of the left-lower field reveals rales. Rales present. No decreased breath sounds, wheezing or rhonchi.  Chest:     Chest wall: No tenderness.  Abdominal:     General: Bowel sounds are normal.     Palpations: Abdomen is soft.     Tenderness: There is no abdominal tenderness. There is no guarding or rebound.  Musculoskeletal: Normal range of motion.     Right lower leg: No edema.     Left lower leg: No edema.     Comments: 1 pitting edema to the knees. Left arm with decreased mobility.   Skin:     General: Skin is warm and dry.     Capillary Refill: Capillary refill takes 2 to 3 seconds.  Neurological:     General: No focal deficit present.     Mental Status: She is alert and oriented to person, place, and time.  Psychiatric:        Mood and Affect: Mood normal.        Behavior: Behavior normal.      Labs and Imaging: I have personally reviewed following labs and imaging studies CBC: Recent Labs  Lab 01/25/19 1400  WBC 10.5  HGB 10.5*  HCT 34.6*  MCV 98.9  PLT 601   Basic Metabolic Panel: Recent Labs  Lab 01/25/19 1400  NA 143  K 4.4  CL 114*  CO2 20*  GLUCOSE 102*  BUN 26*  CREATININE 2.23*  CALCIUM 9.0   GFR: Estimated Creatinine Clearance: 26 mL/min (A) (by C-G formula based on SCr of 2.23 mg/dL (H)). Liver Function Tests: Recent Labs  Lab 01/25/19 1433  AST 14*  ALT 13  ALKPHOS 61  BILITOT 0.5  PROT 5.3*  ALBUMIN 2.9*   BNP 1574.7   Recent Labs  Lab 01/25/19 1437  TROPIPOC 0.03   Imaging/Diagnostic Tests: Dg Chest 2 View IMPRESSION: Bilateral pleural effusions with associated atelectasis/consolidation.    EKG Interpretation  Date/Time:  Wednesday January 25 2019 13:51:24 EDT Ventricular Rate:  132 PR Interval:    QRS Duration: 80 QT Interval:  306 QTC Calculation: 453 R Axis:   84 Text Interpretation:  Atrial fibrillation with rapid ventricular response ST & T wave abnormality, consider inferior ischemia Abnormal ECG Confirmed by Isla Pence [phone]) on 01/25/2019 1:53:49 PM Also confirmed by Fredia Sorrow [phone])  on 01/25/2019 2:15:10 PM       Dg Chest 2 View  Result Date: 01/25/2019 CLINICAL DATA:  73 year old female with chest pain EXAM: CHEST - 2 VIEW COMPARISON:  05/07/2009, 09/24/2008. FINDINGS: Cardiomediastinal silhouette unchanged in size and contour. New opacity at the right lung base with obscuration of the right hemidiaphragm and the  right heart border. Opacity at the left lung base with blunting of left costophrenic  angle. Lateral view demonstrates meniscus in the costophrenic sulcus. No pneumothorax.  Interlobular septal thickening. IMPRESSION: Bilateral pleural effusions with associated atelectasis/consolidation. Electronically Signed   By: Corrie Mckusick D.O.   On: 01/25/2019 15:33    Wilber Oliphant, M.D. 01/25/2019, 9:04 PM PGY-1, [place] Intern pager: [phone], text pages welcome  FPTS Upper-Level Resident Addendum   I have independently interviewed and examined the patient. I have discussed the above with the original author and agree with their documentation. My edits for correction/addition/clarification are in blue. Please see also any attending notes.   Caroline More, DO PGY-2, [facility] 01/25/2019 9:44 PM  [facility] pager: [phone] (text pages welcome through [employer])

## 2019-01-26 ENCOUNTER — Inpatient Hospital Stay (HOSPITAL_COMMUNITY): Payer: 59

## 2019-01-26 DIAGNOSIS — R0902 Hypoxemia: Secondary | ICD-10-CM

## 2019-01-26 DIAGNOSIS — N184 Chronic kidney disease, stage 4 (severe): Secondary | ICD-10-CM

## 2019-01-26 DIAGNOSIS — I4891 Unspecified atrial fibrillation: Secondary | ICD-10-CM

## 2019-01-26 DIAGNOSIS — R0609 Other forms of dyspnea: Secondary | ICD-10-CM

## 2019-01-26 DIAGNOSIS — I5031 Acute diastolic (congestive) heart failure: Secondary | ICD-10-CM

## 2019-01-26 DIAGNOSIS — N171 Acute kidney failure with acute cortical necrosis: Secondary | ICD-10-CM

## 2019-01-26 LAB — CBC
HCT: 32.3 % — ABNORMAL LOW (ref 36.0–46.0)
Hemoglobin: 10.2 g/dL — ABNORMAL LOW (ref 12.0–15.0)
MCH: 30.8 pg (ref 26.0–34.0)
MCHC: 31.6 g/dL (ref 30.0–36.0)
MCV: 97.6 fL (ref 80.0–100.0)
Platelets: 228 10*3/uL (ref 150–400)
RBC: 3.31 MIL/uL — ABNORMAL LOW (ref 3.87–5.11)
RDW: 15.1 % (ref 11.5–15.5)
WBC: 9.4 10*3/uL (ref 4.0–10.5)
nRBC: 0 % (ref 0.0–0.2)

## 2019-01-26 LAB — LIPID PANEL
Cholesterol: 157 mg/dL (ref 0–200)
HDL: 64 mg/dL (ref >40)
LDL Cholesterol: 67 mg/dL (ref 0–99)
Total CHOL/HDL Ratio: 2.5 RATIO
Triglycerides: 128 mg/dL (ref <150)
VLDL: 26 mg/dL (ref 0–40)

## 2019-01-26 LAB — BASIC METABOLIC PANEL
Anion gap: 11 (ref 5–15)
BUN: 27 mg/dL — ABNORMAL HIGH (ref 8–23)
CO2: 20 mmol/L — ABNORMAL LOW (ref 22–32)
Calcium: 8.8 mg/dL — ABNORMAL LOW (ref 8.9–10.3)
Chloride: 112 mmol/L — ABNORMAL HIGH (ref 98–111)
Creatinine, Ser: 2.49 mg/dL — ABNORMAL HIGH (ref 0.44–1.00)
GFR calc Af Amer: 22 mL/min — ABNORMAL LOW (ref >60)
GFR calc non Af Amer: 19 mL/min — ABNORMAL LOW (ref >60)
Glucose, Bld: 94 mg/dL (ref 70–99)
Potassium: 3.9 mmol/L (ref 3.5–5.1)
Sodium: 143 mmol/L (ref 135–145)

## 2019-01-26 LAB — ECHOCARDIOGRAM COMPLETE
Height: 65 in
Weight: 3294.55 oz

## 2019-01-26 LAB — HEMOGLOBIN A1C
Hgb A1c MFr Bld: 5.2 % (ref 4.8–5.6)
Mean Plasma Glucose: 102.54 mg/dL

## 2019-01-26 LAB — TROPONIN I
Troponin I: <0.03 ng/mL (ref <0.03)
Troponin I: <0.03 ng/mL (ref <0.03)

## 2019-01-26 LAB — HEPARIN LEVEL (UNFRACTIONATED)
Heparin Unfractionated: 0.4 IU/mL (ref 0.30–0.70)
Heparin Unfractionated: 0.12 IU/mL — ABNORMAL LOW (ref 0.30–0.70)

## 2019-01-26 MED ORDER — APIXABAN 2.5 MG PO TABS: 2.5000 mg | ORAL_TABLET | Freq: Two times a day (BID) | ORAL | Status: DC

## 2019-01-26 MED ORDER — APIXABAN 2.5 MG PO TABS
2.5000 mg | ORAL_TABLET | Freq: Two times a day (BID) | ORAL | Status: DC
  Administered 2019-01-26 – 2019-01-29 (×7): 2.5 mg via ORAL
  Filled 2019-01-26 (×8): qty 1

## 2019-01-26 NOTE — Progress Notes (Signed)
Family Medicine Teaching Service Daily Progress Note Intern Pager: [phone]  Patient name: Jaclyn Day Medical record number: [id number] Date of birth: 08-21-1946 Age: 73 y.o. Gender: female  Primary Care Provider: Cyndi Bender, PA-C Consultants: Nephrology Code Status: Full Code  Pt Overview and Major Events to Date:  4/15 admit to FPTS  Assessment and Plan: Jaclyn Day is a 73 y.o. female admitted for hypoxia and diagnosed with new onset Afib. Also found to have possible HF with BNP 1574.7 w/ orthopnea and DOE.  Her chronic conditions include CKD (unilateral kidney- congenital), HTN, CHF  #Acute hypoxemic respiratory failure - in the setting of acute CHF and new afib w/RVR. She s/p 40 IV lasix with 1L UOP for net negative -845ml overnight. Weight improved at 205 lb from 209 lb yesterday. Admit CXR with bil pleural effusions - new O2 2L by [place], no home O2 requirement, WAT - echo ended, result pending - Strict I/O, daily weights - 40 IV lasix ordered BID - Cr 2.49 < 2.23 yesterday, monitor closely  Afib with RVR - new this admission. HR controlled this AM at 92. Patient started on heparin overnight, would prefer DOAC to warfarin. Will check with nephro regarding whether we can start Eliquis. - follow up echo - transition off dilt to PO today after echo results: Consider PO 60 mg q8H and stop dilt gtt 1-2 hours after starting PO dose  Chest pain Resolved this AM. Trop negative x2. - one more troponin pending - AM EKG unremarkable - risk stratification labs  CKD and unilateral kidney Follows with Dr. Justin Mend. - avoid nephrotoxic agetns -diuresis as above  HTN - dilt and lasix as above - Call Dr. Vista Lawman, her PCP at [phone] to obtain records regarding home meds   Osteoarthritis Walks with assisted device at home, will use wheelchair whenever she leaves the house. Patient is a fall risk, and especially more worrisome in the setting of new anticoagulation.  Patient  reports that she is going to need bilateral knee replacements per previous work-ups.  Fall precautions in setting of Warfarin   Out of bed with assistance  Avoid NSAIDS  # Anemia, MCV 98.9  Likely 32/2 CKD.   Trend Hgb   Obtain B12 and folate    #Bony Cyst, left arm  Patient reports that this is currently being worked up by her PCP.  She does not have any new complaints today.  We will continue to follow symptoms but will not formally address unless acute findings.  follow-up outpatient  #FEN/GI:   Electrolytes: Monitor with Lasix  Nutrition: Heart healthy, fluid restriction 1200 mL   Disposition: anticipate 1-2 days of hospitalization for diuresis  Subjective:  Patient pleasant this morning. Has 2L by [place] but denies dyspnea. Denies chest pain. Denies acute events overnight.  Objective: Temp:  [97.8 F (36.6 C)-98.1 F (36.7 C)] 97.8 F (36.6 C) (04/16 0627) Pulse Rate:  [51-134] 92 (04/16 0627) Resp:  [18-30] 19 (04/16 0627) BP: (136-172)/(78-131) 148/78 (04/16 0627) SpO2:  [87 %-97 %] 93 % (04/16 0627) Weight:  [92.5 kg-95.2 kg] 93.4 kg (04/16 0627) Physical Exam: General: NAD, well appearing Cardiovascular: RRR no m/r/g Respiratory: CTA bil, no W/R/R Abdomen: soft and nontender Extremities: 1-2+ pitting edema bil  Laboratory: Recent Labs  Lab 01/25/19 1400 01/26/19 0419  WBC 10.5 9.4  HGB 10.5* 10.2*  HCT 34.6* 32.3*  PLT 298 228   Recent Labs  Lab 01/25/19 1400 01/25/19 1433 01/26/19 0419  NA 143  --  143  K 4.4  --  3.9  CL 114*  --  112*  CO2 20*  --  20*  BUN 26*  --  27*  CREATININE 2.23*  --  2.49*  CALCIUM 9.0  --  8.8*  PROT  --  5.3*  --   BILITOT  --  0.5  --   ALKPHOS  --  61  --   ALT  --  13  --   AST  --  14*  --   GLUCOSE 102*  --  94     Imaging/Diagnostic Tests: Dg Chest 2 View  Result Date: 01/25/2019 CLINICAL DATA:  73 year old female with chest pain EXAM: CHEST - 2 VIEW COMPARISON:  05/07/2009, 09/24/2008.  FINDINGS: Cardiomediastinal silhouette unchanged in size and contour. New opacity at the right lung base with obscuration of the right hemidiaphragm and the right heart border. Opacity at the left lung base with blunting of left costophrenic angle. Lateral view demonstrates meniscus in the costophrenic sulcus. No pneumothorax.  Interlobular septal thickening. IMPRESSION: Bilateral pleural effusions with associated atelectasis/consolidation. Electronically Signed   By: Corrie Mckusick D.O.   On: 01/25/2019 15:33     Everrett Coombe, MD 01/26/2019, 9:46 AM PGY-3, [place] Intern pager: [phone], text pages welcome

## 2019-01-26 NOTE — Progress Notes (Signed)
Patient ID: Jaclyn Day, female   DOB: 03-23-1946, 73 y.o.   MRN: [id number] [facility] Progress Note   Assessment/ Plan:   1. 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 fib with RVR/renal hypoperfusion versus cardiorenal with CHF exacerbation.  Overnight, creatinine slightly higher with decent response to intravenous furosemide.  We discussed the importance for her to comply with diuretics/low sodium intake.  No indications for renal replacement therapy at this time with improving volume status and absence of acute electrolyte abnormalities/uremic symptoms. 2.  Acute hypoxemic respiratory failure: Secondary to A. fib with RVR with CHF decompensation 3.  Atrial fibrillation with rapid ventricular response: Improving with ongoing rate control on diltiazem drip, started on heparin drip for anticoagulation.  2D echocardiogram done earlier today. 4.  Hypertension: Blood pressure marginally elevated, monitor with diuresis. 5.  Anemia: I will add iron studies to labs for tomorrow morning.  No overt loss.  Indeed volume status may be contributing to a dilutional effect.  Subjective:   Reports to be feeling better this morning with decreased shortness of breath.  Denies any chest pain and feels as if legs are less swollen.   Objective:   BP (!) 148/78 Comment (BP Location): left forearm  Pulse 92   Temp 97.8 F (36.6 C) (Oral)   Resp 19   Ht 5\' 5"  (1.651 m)   Wt 93.4 kg   SpO2 93%   BMI 34.27 kg/m   Intake/Output Summary (Last 24 hours) at 01/26/2019 1134 Last data filed at 01/26/2019 1039 Gross per 24 hour  Intake 344.75 ml  Output 1400 ml  Net -1055.25 ml   Weight change:   Physical Exam: Gen: Comfortably resting in bed, watching television CVS: Pulse irregularly irregular, normal rate, S1 and S2 normal Resp: Diminished breath sounds over bases  with fine rales bilaterally Abd: Soft, obese, nontender Ext: 1+ bilateral pedal edema  Imaging: Dg Chest 2 View  Result Date: 01/25/2019 CLINICAL DATA:  73 year old female with chest pain EXAM: CHEST - 2 VIEW COMPARISON:  05/07/2009, 09/24/2008. FINDINGS: Cardiomediastinal silhouette unchanged in size and contour. New opacity at the right lung base with obscuration of the right hemidiaphragm and the right heart border. Opacity at the left lung base with blunting of left costophrenic angle. Lateral view demonstrates meniscus in the costophrenic sulcus. No pneumothorax.  Interlobular septal thickening. IMPRESSION: Bilateral pleural effusions with associated atelectasis/consolidation. Electronically Signed   By: Corrie Mckusick D.O.   On: 01/25/2019 15:33    Labs: BMET Recent Labs  Lab 01/25/19 1400 01/26/19 0419  NA 143 143  K 4.4 3.9  CL 114* 112*  CO2 20* 20*  GLUCOSE 102* 94  BUN 26* 27*  CREATININE 2.23* 2.49*  CALCIUM 9.0 8.8*   CBC Recent Labs  Lab 01/25/19 1400 01/26/19 0419  WBC 10.5 9.4  HGB 10.5* 10.2*  HCT 34.6* 32.3*  MCV 98.9 97.6  PLT 298 228    Medications:    . furosemide  40 mg Intravenous Q12H   Elmarie Shiley, MD 01/26/2019, 11:34 AM

## 2019-01-26 NOTE — Progress Notes (Signed)
ANTICOAGULATION CONSULT NOTE - Follow Up Consult  Pharmacy Consult for heparin Indication: atrial fibrillation  Labs: Recent Labs    01/25/19 1400 01/25/19 2207 01/26/19 0419  HGB 10.5*  --  10.2*  HCT 34.6*  --  32.3*  PLT 298  --  228  HEPARINUNFRC  --   --  0.40  CREATININE 2.23*  --   --   TROPONINI  --  <0.03  --     Assessment/Plan:  73yo female therapeutic on heparin with initial dosing for Afib. Will continue gtt at current rate and confirm stable with additional level.   Jaclyn Day, PharmD, BCPS  01/26/2019,5:29 AM

## 2019-01-26 NOTE — Plan of Care (Signed)
  Problem: Education: Goal: Knowledge of disease or condition will improve Outcome: Progressing Goal: Understanding of medication regimen will improve Outcome: Progressing Goal: Individualized Educational Video(s) Outcome: Progressing   

## 2019-01-26 NOTE — Consult Note (Addendum)
Cardiology Consultation:   Patient ID: Jaclyn Day MRN: [id number]; DOB: September 12, 1946  Admit date: 01/25/2019 Date of Consult: 01/26/2019  Primary Care Provider: Cyndi Bender, PA-C Primary Cardiologist: No primary care provider on file.  Primary Electrophysiologist:  None   Patient Profile:   Jaclyn Day is a 73 y.o. female with a hx of unilateral kidney (congenital) and HTN  who is being seen today for the evaluation of CHF at the request of Dr. Ardelia Mems.  History of Present Illness:   Jaclyn Day is a 73 yo female with PMH noted above. She is followed by her PCP in the outpatient setting. She is also followed by Dr. Justin Mend in regards to her renal issues. She has been in her usual state of health until about a week ago. Developed onset of shortness of breath and fatigue. Also with mild chest tightness and increased LE edema. She has also been waking up at night feeling short of breath. Denies any COVID contacts. Presented to her nephrologist office 4/15 and found to be hypoxic with sats in the 80s. She was sent to the ED for further evaluation.   In the ED her labs showed stable electrolytes, Cr 2.23, BNP 1574, Hgb 10.5, Trop negx3. CXR with bilateral effusions. EKG with new onset Afib rate 132. Placed on Dilt gtt and given IV lasix 40mg  while in the ED. She was admitted to IM for diuresis and further work up.   Past Medical History:  Diagnosis Date  . Hypertension   . Renal disorder    History reviewed. No pertinent surgical history.   Home Medications:  Prior to Admission medications   Medication Sig Start Date End Date Taking? Authorizing Provider  amLODipine (NORVASC) 10 MG tablet Take 10 mg by mouth daily. 12/30/18  Yes [provider]  ARIPiprazole (ABILIFY) 10 MG tablet Take 5 mg by mouth 2 (two) times daily. 12/27/18  Yes [provider]  atorvastatin (LIPITOR) 20 MG tablet Take 20 mg by mouth daily. 11/04/18  Yes [provider]   brimonidine (ALPHAGAN) 0.15 % ophthalmic solution Place 1 drop into both eyes 2 (two) times daily.   Yes [provider]  divalproex (DEPAKOTE) 500 MG DR tablet Take 1,000 mg by mouth at bedtime. 12/11/18  Yes [provider]  dorzolamide-timolol (COSOPT) 22.3-6.8 MG/ML ophthalmic solution Place 1 drop into both eyes 2 (two) times daily. 01/24/19  Yes [provider]  DULoxetine (CYMBALTA) 60 MG capsule Take 60 mg by mouth daily. 01/01/19  Yes [provider]  hydrALAZINE (APRESOLINE) 25 MG tablet Take 25 mg by mouth 2 (two) times daily. 11/04/18  Yes [provider]  hydrOXYzine (ATARAX/VISTARIL) 10 MG tablet Take 10 mg by mouth 3 (three) times daily as needed for anxiety. 12/29/18  Yes [provider]  metoprolol succinate (TOPROL-XL) 50 MG 24 hr tablet Take 100 mg by mouth daily. 01/23/19  Yes [provider]  ondansetron (ZOFRAN) 4 MG tablet Take 4 mg by mouth 3 (three) times daily as needed for nausea/vomiting. 11/07/18  Yes [provider]  traMADol (ULTRAM) 50 MG tablet Take 50-100 mg by mouth 2 (two) times daily. For severe knee pain 12/21/18  Yes [provider]    Inpatient Medications: Scheduled Meds: . furosemide  40 mg Intravenous Q12H   Continuous Infusions: . diltiazem (CARDIZEM) infusion 15 mg/hr (01/26/19 1444)  . heparin 1,350 Units/hr (01/26/19 1243)   PRN Meds: acetaminophen, ondansetron (ZOFRAN) IV  Allergies:   No Known Allergies  Social History:   Social History   Socioeconomic History  . Marital status: Single    Spouse name: Not on file  . Number of children: Not on file  . Years of education: Not on file  . Highest education level: Not on file  Occupational History  . Not on file  Social Needs  . Financial resource strain: Not on file  . Food insecurity:    Worry: Not on file    Inability: Not on file  . Transportation needs:    Medical: Not on file    Non-medical: Not on file   Tobacco Use  . Smoking status: Never Smoker  . Smokeless tobacco: Never Used  Substance and Sexual Activity  . Alcohol use: Not Currently  . Drug use: Not Currently  . Sexual activity: Not on file  Lifestyle  . Physical activity:    Days per week: Not on file    Minutes per session: Not on file  . Stress: Not on file  Relationships  . Social connections:    Talks on phone: Not on file    Gets together: Not on file    Attends religious service: Not on file    Active member of club or organization: Not on file    Attends meetings of clubs or organizations: Not on file    Relationship status: Not on file  . Intimate partner violence:    Fear of current or ex partner: Not on file    Emotionally abused: Not on file    Physically abused: Not on file    Forced sexual activity: Not on file  Other Topics Concern  . Not on file  Social History Narrative   Lives with her boyfriend in [place]. He is her POA.     Family History:    Family History  Problem Relation Age of Onset  . Atrial fibrillation Father   . Atrial fibrillation Brother      ROS:  Please see the history of present illness.   All other ROS reviewed and negative.     Physical Exam/Data:   Vitals:   01/25/19 1945 01/25/19 2338 01/26/19 0627 01/26/19 1200  BP: (!) 136/96 (!) 150/98 (!) 148/78 (!) 156/93  Pulse: 91 70 92 98  Resp: 20 18 19  (!) 24  Temp: 98.1 F (36.7 C) 97.8 F (36.6 C) 97.8 F (36.6 C)   TempSrc: Oral Oral Oral   SpO2: 92% 92% 93% 93%  Weight:   93.4 kg   Height:        Intake/Output Summary (Last 24 hours) at 01/26/2019 1540 Last data filed at 01/26/2019 1039 Gross per 24 hour  Intake 344.75 ml  Output 1400 ml  Net -1055.25 ml   Last 3 Weights 01/26/2019 01/25/2019 01/25/2019  Weight (lbs) 205 lb 14.6 oz 209 lb 14.1 oz 204 lb  Weight (kg) 93.4 kg 95.2 kg 92.534 kg     Body mass index is 34.27 kg/m.   Physical Exam per MD.  General:  Well nourished, well developed, in no acute  distress HEENT: normal Lymph: no adenopathy Neck: no JVD Endocrine:  No thryomegaly Vascular: No carotid bruits; FA pulses 2+ bilaterally without bruits  Cardiac:  normal S1, S2; iRRR; no murmur  Lungs: Crackles to auscultation bilaterally, no wheezing, rhonchi or rales  Abd: soft, nontender, no hepatomegaly  Ext: no edema Musculoskeletal:  No deformities, BUE and BLE strength normal and equal Skin: warm and dry  Neuro:  CNs 2-12 intact, no  focal abnormalities noted Psych:  Normal affect   EKG:  The EKG was personally reviewed and demonstrates:  Afib RVR Telemetry:  Telemetry was personally reviewed and demonstrates:  A-fib with RVR  Relevant CV Studies:  TTE: 01/26/19   1. The left ventricle has normal systolic function with an ejection fraction of 60-65%. The cavity size was normal. There is mild concentric left ventricular hypertrophy. Left ventricular diastolic function could not be evaluated secondary to atrial  fibrillation. No evidence of left ventricular regional wall motion abnormalities.  2. The right ventricle has normal systolic function. The cavity was normal. There is no increase in right ventricular wall thickness. Right ventricular systolic pressure could not be assessed.  3. Left atrial size was severely dilated.  4. The interatrial septum appears to be lipomatous.  Laboratory Data:  Chemistry Recent Labs  Lab 01/25/19 1400 01/26/19 0419  NA 143 143  K 4.4 3.9  CL 114* 112*  CO2 20* 20*  GLUCOSE 102* 94  BUN 26* 27*  CREATININE 2.23* 2.49*  CALCIUM 9.0 8.8*  GFRNONAA 21* 19*  GFRAA 25* 22*  ANIONGAP 9 11    Recent Labs  Lab 01/25/19 1433  PROT 5.3*  ALBUMIN 2.9*  AST 14*  ALT 13  ALKPHOS 61  BILITOT 0.5   Hematology Recent Labs  Lab 01/25/19 1400 01/26/19 0419  WBC 10.5 9.4  RBC 3.50* 3.31*  HGB 10.5* 10.2*  HCT 34.6* 32.3*  MCV 98.9 97.6  MCH 30.0 30.8  MCHC 30.3 31.6  RDW 15.1 15.1  PLT 298 228   Cardiac Enzymes Recent Labs   Lab 01/25/19 2207 01/26/19 0419 01/26/19 0953  TROPONINI <0.03 <0.03 <0.03    Recent Labs  Lab 01/25/19 1437  TROPIPOC 0.03    BNP Recent Labs  Lab 01/25/19 1433  BNP 1,574.7*    DDimer No results for input(s): DDIMER in the last 168 hours.  Radiology/Studies:  Dg Chest 2 View  Result Date: 01/25/2019 CLINICAL DATA:  73 year old female with chest pain EXAM: CHEST - 2 VIEW COMPARISON:  05/07/2009, 09/24/2008. FINDINGS: Cardiomediastinal silhouette unchanged in size and contour. New opacity at the right lung base with obscuration of the right hemidiaphragm and the right heart border. Opacity at the left lung base with blunting of left costophrenic angle. Lateral view demonstrates meniscus in the costophrenic sulcus. No pneumothorax.  Interlobular septal thickening. IMPRESSION: Bilateral pleural effusions with associated atelectasis/consolidation. Electronically Signed   By: Corrie Mckusick D.O.   On: 01/25/2019 15:33    Assessment and Plan:   Jaclyn Day is a 73 y.o. female with a hx of unilateral kidney (congenital) and HTN  who is being seen today for the evaluation of CHF at the request of Dr. Ardelia Mems.  1. Acute diastolic HF: BNP >5003, and chest xray with bilateral pleural effusions. She has been on IV lasix 40mg  BID. Net -1L with weight trending down 209>>205. Nephrology following with diuretic given her renal issues.  I agree with Lasix 40 mg IV twice daily and close follow-up of creatinine.  Suspect onset of Afib is playing a role in HF symptoms. Echo today with normal EF and no rWMA noted.    2. Afib RVR: Placed on Dilt gtt while in the ED. HR is now better controlled, though remains in Afib. would continue on gtt for now. If rate remains controlled would plan to transition to oral dosing -but rather beta-blocker given CHF. If symptoms not improved with diuresis, may need to consider TEE/DCCV.  Left atrium is severely dilated however will give it at least 1 try of  cardioversion.  If that fails we might consider just rate control.. -- remains on IV heparin. Discussion regarding possible DOAC. Age and weight appropriate.  -- CHA2DS2-VASc Score of at least 3.  3. CKD with unilateral kidney: followed by nephrology as outpatient. Cr 2.23>>2.49, we do not know her baseline creatinine  4. Chest pain: Atypical, shortness of breath with chest heaviness in the last 2 weeks, most probably related to fluid overload in the settings of A. fib with RVR.  Troponin negative x3.  Her LVEF is normal, I would not proceed with ischemic eval of at this moment.    For questions or updates, please contact [place] Please consult [email or web address] for contact info under   Signed, Ena Dawley, MD 01/26/2019 3:40 PM

## 2019-01-26 NOTE — Progress Notes (Signed)
2D Echocardiogram has been performed.  Jaclyn Day 01/26/2019, 9:13 AM

## 2019-01-26 NOTE — Progress Notes (Signed)
ANTICOAGULATION CONSULT NOTE - Follow-Up Consult  Pharmacy Consult for Heparin Indication: atrial fibrillation  No Known Allergies  Patient Measurements: Height: 5\' 5"  (165.1 cm) Weight: 205 lb 14.6 oz (93.4 kg) IBW/kg (Calculated) : 57  Heparin Dosing Weight: 78 kg  Vital Signs: Temp: 97.8 F (36.6 C) (04/16 0627) Temp Source: Oral (04/16 0627) BP: 148/78 (04/16 0627) Pulse Rate: 92 (04/16 0627)  Labs: Recent Labs    01/25/19 1400 01/25/19 2207 01/26/19 0419 01/26/19 0953  HGB 10.5*  --  10.2*  --   HCT 34.6*  --  32.3*  --   PLT 298  --  228  --   HEPARINUNFRC  --   --  0.40 0.12*  CREATININE 2.23*  --  2.49*  --   TROPONINI  --  <0.03 <0.03 <0.03    Estimated Creatinine Clearance: 23.1 mL/min (A) (by C-G formula based on SCr of 2.49 mg/dL (H)).   Medical History: Past Medical History:  Diagnosis Date  . Hypertension   . Renal disorder    Assessment: 23 YOF with new onset afib, no anticoagulation PTA, chronic anemia stable.  Pharmacy consulted to dose heparin.  Confirmatory heparin level is subtherapeutic at 0.12. Per RN, IV site was changed ~0830 due to minor bleeding at original IV site. Heparin stopped for only a short period of time. Heparin level drawn at 0953, so would not expect it to significantly be affected by IV site change. Will increase drip rate. Of note, there are plans to potentially switch to [place] today per family medicine team.  Goal of Therapy:  Heparin level 0.3-0.7 Monitor platelets by anticoagulation protocol: Yes   Plan:  Increase heparin drip to 1350 units/hr Check 8 hour heparin level Daily heparin level, CBC, s/sx bleeding if heparin continued tomorrow  Jackson Latino, PharmD PGY1 Pharmacy Resident Phone [phone] 01/26/2019     12:31 PM

## 2019-01-27 LAB — CBC
HCT: 31.7 % — ABNORMAL LOW (ref 36.0–46.0)
Hemoglobin: 9.9 g/dL — ABNORMAL LOW (ref 12.0–15.0)
MCH: 30.7 pg (ref 26.0–34.0)
MCHC: 31.2 g/dL (ref 30.0–36.0)
MCV: 98.1 fL (ref 80.0–100.0)
Platelets: 230 10*3/uL (ref 150–400)
RBC: 3.23 MIL/uL — ABNORMAL LOW (ref 3.87–5.11)
RDW: 15 % (ref 11.5–15.5)
WBC: 10.9 10*3/uL — ABNORMAL HIGH (ref 4.0–10.5)
nRBC: 0 % (ref 0.0–0.2)

## 2019-01-27 LAB — BASIC METABOLIC PANEL
Anion gap: 8 (ref 5–15)
BUN: 27 mg/dL — ABNORMAL HIGH (ref 8–23)
CO2: 25 mmol/L (ref 22–32)
Calcium: 8.8 mg/dL — ABNORMAL LOW (ref 8.9–10.3)
Chloride: 109 mmol/L (ref 98–111)
Creatinine, Ser: 2.48 mg/dL — ABNORMAL HIGH (ref 0.44–1.00)
GFR calc Af Amer: 22 mL/min — ABNORMAL LOW (ref >60)
GFR calc non Af Amer: 19 mL/min — ABNORMAL LOW (ref >60)
Glucose, Bld: 110 mg/dL — ABNORMAL HIGH (ref 70–99)
Potassium: 4.1 mmol/L (ref 3.5–5.1)
Sodium: 142 mmol/L (ref 135–145)

## 2019-01-27 MED ORDER — DIVALPROEX SODIUM 500 MG PO DR TAB
1000.0000 mg | DELAYED_RELEASE_TABLET | Freq: Every day | ORAL | Status: DC
  Administered 2019-01-27 – 2019-01-30 (×4): 1000 mg via ORAL
  Filled 2019-01-27 (×4): qty 2

## 2019-01-27 MED ORDER — BRIMONIDINE TARTRATE 0.15 % OP SOLN
1.0000 [drp] | Freq: Two times a day (BID) | OPHTHALMIC | Status: DC
  Administered 2019-01-27 – 2019-01-31 (×9): 1 [drp] via OPHTHALMIC
  Filled 2019-01-27: qty 5

## 2019-01-27 MED ORDER — DULOXETINE HCL 60 MG PO CPEP
60.0000 mg | ORAL_CAPSULE | Freq: Every day | ORAL | Status: DC
  Administered 2019-01-27 – 2019-01-31 (×5): 60 mg via ORAL
  Filled 2019-01-27 (×5): qty 1

## 2019-01-27 MED ORDER — FUROSEMIDE 10 MG/ML IJ SOLN
40.0000 mg | Freq: Three times a day (TID) | INTRAMUSCULAR | Status: DC
  Administered 2019-01-27 – 2019-01-28 (×3): 40 mg via INTRAVENOUS
  Filled 2019-01-27 (×3): qty 4

## 2019-01-27 MED ORDER — HYDRALAZINE HCL 20 MG/ML IJ SOLN: 2.0000 mg | Freq: Three times a day (TID) | INTRAMUSCULAR | Status: DC | PRN

## 2019-01-27 MED ORDER — METOLAZONE 5 MG PO TABS
5.0000 mg | ORAL_TABLET | Freq: Once | ORAL | Status: AC
  Administered 2019-01-27: 5 mg via ORAL
  Filled 2019-01-27: qty 1

## 2019-01-27 MED ORDER — ARIPIPRAZOLE 5 MG PO TABS
5.0000 mg | ORAL_TABLET | Freq: Two times a day (BID) | ORAL | Status: DC
  Administered 2019-01-27 – 2019-01-31 (×9): 5 mg via ORAL
  Filled 2019-01-27 (×9): qty 1

## 2019-01-27 MED ORDER — DORZOLAMIDE HCL-TIMOLOL MAL 2-0.5 % OP SOLN
1.0000 [drp] | Freq: Two times a day (BID) | OPHTHALMIC | Status: DC
  Administered 2019-01-27 – 2019-01-31 (×9): 1 [drp] via OPHTHALMIC
  Filled 2019-01-27: qty 10

## 2019-01-27 MED ORDER — METOPROLOL TARTRATE 25 MG PO TABS
25.0000 mg | ORAL_TABLET | Freq: Two times a day (BID) | ORAL | Status: DC
  Administered 2019-01-27 (×2): 25 mg via ORAL
  Filled 2019-01-27 (×2): qty 1

## 2019-01-27 NOTE — Evaluation (Signed)
Physical Therapy Evaluation Patient Details Name: Jaclyn Day MRN: [id number] DOB: 17-Jan-1946 Today's Date: 01/27/2019   History of Present Illness  73 y.o. female admitted for hypoxia with new onset AFib and acute on chronic CHF. PMHx: unilateral kidney (congenital), bil knee OA and HTN   Clinical Impression  PT pleasant and willing to mobilize on arrival. Pt on 2L on arrival at 97% but maintained 92-95% during basic mobility and transfers with drop to 86% with limited gait. PT with baseline weakness and limited mobility who will benefit from acute therapy to maximize independence, mobility and safety. Recommend use of BSC throughout the day with nursing to increase activity.   HR 108-126 with activity 93% supine on RA  And 94% end of session     Follow Up Recommendations Home health PT;Supervision/Assistance - 24 hour    Equipment Recommendations  None recommended by PT    Recommendations for Other Services       Precautions / Restrictions Precautions Precautions: Fall Precaution Comments: watch sats      Mobility  Bed Mobility Overal bed mobility: Modified Independent Bed Mobility: Supine to Sit     Supine to sit: HOB elevated     General bed mobility comments: with use of rail and HOB 35 degrees pt able to pivot to EOB without physical assist  Transfers Overall transfer level: Needs assistance   Transfers: Sit to/from Stand;Stand Pivot Transfers Sit to Stand: Min guard Stand pivot transfers: Min guard       General transfer comment: cues for hand placement, posture, safety and position in RW pt stood from bed, pivoted to [facility] then walked  Ambulation/Gait Ambulation/Gait assistance: Min assist Gait Distance (Feet): 12 Feet Assistive device: Rolling walker (2 wheeled) Gait Pattern/deviations: Step-through pattern;Trunk flexed;Decreased stride length   Gait velocity interpretation: 1.31 - 2.62 ft/sec, indicative of limited community ambulator General  Gait Details: cues for posture and position in RW with pt desaturating to 86% with limited gait on RA with recovering to 92% within 10 sec of sitting  Stairs            Wheelchair Mobility    Modified Rankin (Stroke Patients Only)       Balance Overall balance assessment: Needs assistance;History of Falls   Sitting balance-Leahy Scale: Fair       Standing balance-Leahy Scale: Poor Standing balance comment: bil UE support in standing on RW                             Pertinent Vitals/Pain Pain Assessment: No/denies pain    Home Living Family/patient expects to be discharged to:: Private residence Living Arrangements: Spouse/significant other Available Help at Discharge: Family;Available 24 hours/day;Personal care attendant Type of Home: House Home Access: Ramped entrance     Home Layout: Two level;Able to live on main level with bedroom/bathroom Home Equipment: Gilford Rile - 2 wheels;Cane - single point;Bedside commode;Shower seat;Wheelchair - manual Additional Comments: aide 3hrs/day 4days/wk    Prior Function Level of Independence: Needs assistance   Gait / Transfers Assistance Needed: pt walks grossly 15' at a time with cane otherwise uses w/C   ADL's / Homemaking Assistance Needed: assist for bathing, dressing, toileting. Aide and boyfriend do all housework        Hand Dominance        Extremity/Trunk Assessment   Upper Extremity Assessment Upper Extremity Assessment: Generalized weakness    Lower Extremity Assessment Lower Extremity Assessment: Generalized weakness  Cervical / Trunk Assessment Cervical / Trunk Assessment: Kyphotic  Communication   Communication: No difficulties  Cognition Arousal/Alertness: Awake/alert Behavior During Therapy: Flat affect Overall Cognitive Status: No family/caregiver present to determine baseline cognitive functioning Area of Impairment: Memory;Problem solving;Orientation;Safety/judgement                  Orientation Level: Time   Memory: Decreased short-term memory   Safety/Judgement: Decreased awareness of safety;Decreased awareness of deficits   Problem Solving: Slow processing;Requires verbal cues        General Comments      Exercises     Assessment/Plan    PT Assessment Patient needs continued PT services  PT Problem List Decreased strength;Decreased balance;Decreased cognition;Decreased activity tolerance;Decreased mobility;Decreased safety awareness;Decreased knowledge of use of DME       PT Treatment Interventions Gait training;Therapeutic activities;Therapeutic exercise;DME instruction;Functional mobility training;Balance training;Patient/family education    PT Goals (Current goals can be found in the Care Plan section)  Acute Rehab PT Goals Patient Stated Goal: return home PT Goal Formulation: With patient Time For Goal Achievement: 02/10/19 Potential to Achieve Goals: Fair    Frequency Min 3X/week   Barriers to discharge Decreased caregiver support      Co-evaluation               AM-PAC PT "6 Clicks" Mobility  Outcome Measure Help needed turning from your back to your side while in a flat bed without using bedrails?: A Little Help needed moving from lying on your back to sitting on the side of a flat bed without using bedrails?: A Little Help needed moving to and from a bed to a chair (including a wheelchair)?: A Little Help needed standing up from a chair using your arms (e.g., wheelchair or bedside chair)?: A Little Help needed to walk in hospital room?: A Little Help needed climbing 3-5 steps with a railing? : Total 6 Click Score: 16    End of Session Equipment Utilized During Treatment: Gait belt Activity Tolerance: Patient tolerated treatment well Patient left: in chair;with call bell/phone within reach;with chair alarm set Nurse Communication: Mobility status PT Visit Diagnosis: Other abnormalities of gait and mobility  (R26.89);Muscle weakness (generalized) (M62.81);Unsteadiness on feet (R26.81);History of falling (Z91.81)    Time: 4536-4680 PT Time Calculation (min) (ACUTE ONLY): 29 min   Charges:   PT Evaluation $PT Eval Moderate Complexity: 1 Mod PT Treatments $Therapeutic Activity: 8-22 mins        Rigdon Macomber Pam Drown, PT Acute Rehabilitation Services Pager: [phone] Office: [phone]   Dylon Correa B Lesslie Mckeehan 01/27/2019, 11:32 AM

## 2019-01-27 NOTE — Progress Notes (Addendum)
Family Medicine Teaching Service Daily Progress Note Intern Pager: [phone]  Patient name: Jaclyn Day Medical record number: [id number] Date of birth: [birth year] Age: 73 y.o. Gender: female  Primary Care Provider: Cyndi Bender, PA-C Consultants: Nephrology Code Status: Full Code  Pt Overview and Major Events to Date:  4/15 admit to FPTS  Assessment and Plan: Jaclyn Day is a 73 y.o. female admitted for hypoxia and diagnosed with new onset Afib. Also found to have possible HF with BNP 1574.7 w/ orthopnea and DOE.  Her chronic conditions include CKD (unilateral kidney- congenital), HTN, CHF  Afib with RVR, improved - new this admission. HR 69 this AM. Transitioning to metoprolol 25 mg BID. Dilt gtt still ordered. Appreciate cards recs. - cont metop - follow up cards recs - eliquis 2.5 mg BID with nephro blessing  Acute hypoxemic respiratory failure, improved - hypervolemia and new afib with RVR. UOP -2.4L since admit. Wt 204 lb s >> 195 lbs. Satting well on [facility]. Cr bumping at 2.84 from 2.48 yesterday.. - lasix 40 mg IV BID, consider de-escalating today, will follow up nephro recs - wean to room air  Chest pain Resolved, negative trop x3 and normal EKG.  CKD and unilateral kidney Follows with Dr. Justin Mend. - avoid nephrotoxic agents -diuresis as above - monitor cr - nephro on board  HTN currently normotensive - dilt and lasix as above - home metop - pharm tech did full med rec with the patient's partner, home meds in the chart are now reliable - unable to get in contact with PCP so far  Osteoarthritis Walks with assisted device at home, will use wheelchair whenever she leaves the house. Patient is a fall risk, and especially more worrisome in the setting of new anticoagulation.  Patient reports that she is going to need bilateral knee replacements per previous work-ups.  Out of bed with assistance  Avoid NSAIDS  # Anemia, MCV 98.9  Likely 32/2 CKD.   Trend Hgb    Psychiatric/Mood disorder unspecified. Attempting to contact PCP for problem list. Med rec completed by pharmacy - continue home abilify - continue home depakote - continue home cymbalta  #FEN/GI:   Electrolytes: Monitor with Lasix  Nutrition: Heart healthy, fluid restriction 1200 mL   Disposition: anticipate 1-2 days of hospitalization for diuresis and HR management  Subjective:  No complaints. No acute events overnight. No dyspnea or chest pain.   Objective: Temp:  [98.2 F (36.8 C)-98.6 F (37 C)] 98.4 F (36.9 C) (04/18 1206) Pulse Rate:  [65-94] 89 (04/18 1206) Resp:  [19-25] 19 (04/18 1206) BP: (128-148)/(74-91) 148/91 (04/18 1206) SpO2:  [93 %-97 %] 95 % (04/18 1206) Weight:  [88.8 kg] 88.8 kg (04/18 0700) Physical Exam: General: NAD, well appearing opened for her Cardiovascular: irregularly irregular, no m/r/g Respiratory: CTA bil, no W/R/R Abdomen: soft and nontender, nondistended Extremities: 0-1+ edema bilaterallyl  Echo with normal EF and no regional wall motion abnormalities, severe L atrium dilation.    Laboratory: Recent Labs  Lab 01/26/19 0419 01/27/19 0335 01/28/19 0413  WBC 9.4 10.9* 11.9*  HGB 10.2* 9.9* 10.5*  HCT 32.3* 31.7* 34.5*  PLT 228 230 260   Recent Labs  Lab 01/25/19 1433 01/26/19 0419 01/27/19 0335 01/28/19 0413  NA  --  143 142 139  K  --  3.9 4.1 4.3  CL  --  112* 109 104  CO2  --  20* 25 23  BUN  --  27* 27* 28*  CREATININE  --  2.49* 2.48* 2.84*  CALCIUM  --  8.8* 8.8* 8.8*  PROT 5.3*  --   --   --   BILITOT 0.5  --   --   --   ALKPHOS 61  --   --   --   ALT 13  --   --   --   AST 14*  --   --   --   GLUCOSE  --  94 110* 103*   Imaging/Diagnostic Tests: No results found.  Everrett Coombe, MD 01/28/2019, 1:33 PM PGY-3, [place] Intern pager: [phone], text pages welcome

## 2019-01-27 NOTE — Progress Notes (Signed)
Patient ID: Jaclyn Day, female   DOB: 1945/12/31, 73 y.o.   MRN: [id number] [facility] Progress Note   Assessment/ Plan:   1. 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 fib with RVR/renal hypoperfusion versus cardiorenal with CHF exacerbation.  Renal function essentially stable overnight following initial rise, she does not have impressive urine output with only minimal weight loss overnight-we will transiently increase diuresis to furosemide 40 mg 3 times a day and add a dose of metolazone today. 2.  Acute hypoxemic respiratory failure: Secondary to A. fib with RVR with CHF decompensation 3.  Atrial fibrillation with rapid ventricular response: Improving with ongoing rate control on diltiazem drip, started on heparin drip for anticoagulation.  2D echocardiogram done earlier today. 4.  Hypertension: Blood pressure marginally elevated, monitor with diuresis. 5.  Anemia: Without overt loss, iron stores pending from a.m. labs.  Subjective:   Reports that she is feeling stronger and was able to ambulate around the room, sit in recliner and use bedside commode.   Objective:   BP (!) 152/105   Pulse 68   Temp 97.6 F (36.4 C) (Oral)   Resp 11   Ht 5\' 5"  (1.651 m)   Wt 92 kg   SpO2 98%   BMI 33.75 kg/m   Intake/Output Summary (Last 24 hours) at 01/27/2019 1153 Last data filed at 01/27/2019 1038 Gross per 24 hour  Intake 600 ml  Output 800 ml  Net -200 ml   Weight change: -0.534 kg  Physical Exam: Gen: Comfortably resting in bed, watching television CVS: Pulse irregularly irregular, normal rate, S1 and S2 normal Resp: Poor inspiratory effort with decreased breath sounds over bases.  No wheeze Abd: Soft, obese, nontender Ext: 1+ bilateral pedal edema  Imaging: Dg Chest 2 View  Result Date: 01/25/2019 CLINICAL DATA:  73 year old female with  chest pain EXAM: CHEST - 2 VIEW COMPARISON:  05/07/2009, 09/24/2008. FINDINGS: Cardiomediastinal silhouette unchanged in size and contour. New opacity at the right lung base with obscuration of the right hemidiaphragm and the right heart border. Opacity at the left lung base with blunting of left costophrenic angle. Lateral view demonstrates meniscus in the costophrenic sulcus. No pneumothorax.  Interlobular septal thickening. IMPRESSION: Bilateral pleural effusions with associated atelectasis/consolidation. Electronically Signed   By: Corrie Mckusick D.O.   On: 01/25/2019 15:33    Labs: BMET Recent Labs  Lab 01/25/19 1400 01/26/19 0419 01/27/19 0335  NA 143 143 142  K 4.4 3.9 4.1  CL 114* 112* 109  CO2 20* 20* 25  GLUCOSE 102* 94 110*  BUN 26* 27* 27*  CREATININE 2.23* 2.49* 2.48*  CALCIUM 9.0 8.8* 8.8*   CBC Recent Labs  Lab 01/25/19 1400 01/26/19 0419 01/27/19 0335  WBC 10.5 9.4 10.9*  HGB 10.5* 10.2* 9.9*  HCT 34.6* 32.3* 31.7*  MCV 98.9 97.6 98.1  PLT 298 228 230    Medications:    . apixaban  2.5 mg Oral BID  . furosemide  40 mg Intravenous Q12H  . metoprolol tartrate  25 mg Oral BID   Elmarie Shiley, MD 01/27/2019, 11:53 AM

## 2019-01-27 NOTE — Progress Notes (Signed)
FMTS Attending Daily Note:  Chrisandra Netters MD Personal pager:  [phone] FPTS Service Pager:  [phone]  I have seen and examined this patient and have reviewed their chart. I have discussed this patient with the resident. I agree with the resident's findings, assessment and care plan.  Additionally:  Patient doing well today - breathing improved. nephro has increased lasix dose for better IV diuresis. Patient still volume overloaded with peripheral edema but is now off oxygen, so is showing objective measures of improvement.  Cardiology planning for possible cardioversion. Patient now on eliquis. Appreciate cardiology & nephrology recommendations.   Will sign resident progress note when it is available.  Leeanne Rio, MD 01/27/2019

## 2019-01-27 NOTE — Progress Notes (Signed)
Progress Note  Patient Name: Jaclyn Day Date of Encounter: 01/27/2019  Primary Cardiologist: Ena Dawley, MD (New)  Subjective   No significant overnight events.  She was able to walk with physical therapy but appears quite deconditioned.  Inpatient Medications    Scheduled Meds: . apixaban  2.5 mg Oral BID  . furosemide  40 mg Intravenous Q12H   Continuous Infusions: . diltiazem (CARDIZEM) infusion 15 mg/hr (01/27/19 0549)   PRN Meds: acetaminophen, hydrALAZINE, ondansetron (ZOFRAN) IV   Vital Signs    Vitals:   01/26/19 2011 01/26/19 2325 01/27/19 0557 01/27/19 0820  BP: (!) 155/95 (!) 181/96 (!) 166/93 (!) 175/94  Pulse: 78 (!) 108 (!) 56 68  Resp: 18 (!) 23 (!) 28 11  Temp: 98.2 F (36.8 C) 98.8 F (37.1 C) 97.8 F (36.6 C) 97.6 F (36.4 C)  TempSrc: Oral Oral Oral Oral  SpO2: 96% 96% 95% 98%  Weight:   92 kg   Height:        Intake/Output Summary (Last 24 hours) at 01/27/2019 0933 Last data filed at 01/27/2019 0600 Gross per 24 hour  Intake 720 ml  Output 1200 ml  Net -480 ml   Filed Weights   01/25/19 1834 01/26/19 0627 01/27/19 0557  Weight: 95.2 kg 93.4 kg 92 kg    Telemetry    Fibrillation with ventricular rate 90-1 10- Personally Reviewed  ECG    No new tracing- Personally Reviewed  Physical Exam   Physical Exam per MD:  GEN: 73 year old female resting comfortably. Alert and in no acute distress.   Neck: Supple. No JVD. Cardiac: iRRR. No murmurs, gallops, or rubs.  Respiratory: Clear to auscultation bilaterally. No wheezes, rhonchi, or rales. GI: Abdomen soft, non-distended, and non-tender. Bowel sounds present. Extremities: No lower extremity edema. No deformity. Radial and distal pedal pulses 2+ and equal bilaterally. Skin: Warm and dry. Neuro:  No focal deficits. Psych: Normal affect. Responds appropriately.  Labs    Chemistry Recent Labs  Lab 01/25/19 1400 01/25/19 1433 01/26/19 0419 01/27/19 0335  NA 143   --  143 142  K 4.4  --  3.9 4.1  CL 114*  --  112* 109  CO2 20*  --  20* 25  GLUCOSE 102*  --  94 110*  BUN 26*  --  27* 27*  CREATININE 2.23*  --  2.49* 2.48*  CALCIUM 9.0  --  8.8* 8.8*  PROT  --  5.3*  --   --   ALBUMIN  --  2.9*  --   --   AST  --  14*  --   --   ALT  --  13  --   --   ALKPHOS  --  61  --   --   BILITOT  --  0.5  --   --   GFRNONAA 21*  --  19* 19*  GFRAA 25*  --  22* 22*  ANIONGAP 9  --  11 8     Hematology Recent Labs  Lab 01/25/19 1400 01/26/19 0419 01/27/19 0335  WBC 10.5 9.4 10.9*  RBC 3.50* 3.31* 3.23*  HGB 10.5* 10.2* 9.9*  HCT 34.6* 32.3* 31.7*  MCV 98.9 97.6 98.1  MCH 30.0 30.8 30.7  MCHC 30.3 31.6 31.2  RDW 15.1 15.1 15.0  PLT 298 228 230    Cardiac Enzymes Recent Labs  Lab 01/25/19 2207 01/26/19 0419 01/26/19 0953  TROPONINI <0.03 <0.03 <0.03    Recent Labs  Lab 01/25/19 1437  TROPIPOC 0.03     BNP Recent Labs  Lab 01/25/19 1433  BNP 1,574.7*     DDimer No results for input(s): DDIMER in the last 168 hours.   Radiology    Dg Chest 2 View  Result Date: 01/25/2019 CLINICAL DATA:  73 year old female with chest pain EXAM: CHEST - 2 VIEW COMPARISON:  05/07/2009, 09/24/2008. FINDINGS: Cardiomediastinal silhouette unchanged in size and contour. New opacity at the right lung base with obscuration of the right hemidiaphragm and the right heart border. Opacity at the left lung base with blunting of left costophrenic angle. Lateral view demonstrates meniscus in the costophrenic sulcus. No pneumothorax.  Interlobular septal thickening. IMPRESSION: Bilateral pleural effusions with associated atelectasis/consolidation. Electronically Signed   By: Corrie Mckusick D.O.   On: 01/25/2019 15:33    Cardiac Studies   Echocardiogram 01/26/2019: 1. The left ventricle has normal systolic function with an ejection fraction of 60-65%. The cavity size was normal. There is mild concentric left ventricular hypertrophy. Left ventricular diastolic  function could not be evaluated secondary to atrial  fibrillation. No evidence of left ventricular regional wall motion abnormalities.  2. The right ventricle has normal systolic function. The cavity was normal. There is no increase in right ventricular wall thickness. Right ventricular systolic pressure could not be assessed.  3. Left atrial size was severely dilated.  4. The interatrial septum appears to be lipomatous.  Patient Profile    Jaclyn Day is a 73 y.o. female with a history of unilateral kidney (congenital) and hypertension who is being seen today for the evaluation of CHF at the request of Dr. Ardelia Mems.  Assessment & Plan    Acute Diastolic CHF - Chest x-ray showed bilateral pleural effusions with associated atelectasis/consolidation. - BNP 1,574.7. - Echo showed LVEF of 60-65% with mild concentric LVH but no regional wall motion abnormalities. Left atrial size was noted to be severely dilated.  - Currently on IV Lasix 40mg  twice daily. Documented urine output 1.2 L in the past 24 hours and net negative 1.3 L since admission.  Weight down 2.5 kg -She is still fluid overloaded I would continue IV Lasix, and stable at 2.48 - Continue to monitor daily weights, strict I/O's, and renal function.  Atrial Fibrillation - Telemetry shows A. fib now better controlled but heart rate still around 100 - Currently on IV Diltiazem drip. Would ultimately like to switch to PO beta-blockers if rate remains controlled given CHF. - If symptoms do not improve with diuresis, may need to consider TEE/DCCV. Left atrium noted to be severely dilated on Echo but reasonable to give at least 1 try of cardioversion. -She is still significantly volume overloaded would attempt cardioversion when volume status improved - CHA2DS2-VASc score at least 4 (CHF, HTN, gender, age). Eliquis 2.5mg  twice daily started yesterday.  Hypertension - BP elevated the past 24 hours. Most recent BP 175/94. - Increase  Cardizem drip to 20 mg/h - Add metoprolol 25 mg p.o. twice daily - Primary team is going to call PCP to obtain records regarding home medications.  Chest Pain - Likely secondary to fluid overload in the setting of atrial fibrillation with RVR. - Troponin negative x3. - Echo showed normal LVEF with no regional wall motion abnormalities. - No ischemic evaluation planned at this time.  CKD with Unilateral Kidney - Serum creatinine 2.23 >> 2.49 >> 2.48. Do not know baseline. - Followed by Nephrology as outpatient. - Continue to monitor.  - Management per primary team  and Nephrology.  For questions or updates, please contact [place] Please consult [email or web address] for contact info under Cardiology/STEMI.      SignedDarreld Mclean, PA-C  01/27/2019, 9:33 AM   Pager: [phone]

## 2019-01-27 NOTE — Discharge Instructions (Signed)

## 2019-01-28 LAB — CBC
HCT: 34.5 % — ABNORMAL LOW (ref 36.0–46.0)
Hemoglobin: 10.5 g/dL — ABNORMAL LOW (ref 12.0–15.0)
MCH: 29.7 pg (ref 26.0–34.0)
MCHC: 30.4 g/dL (ref 30.0–36.0)
MCV: 97.7 fL (ref 80.0–100.0)
Platelets: 260 10*3/uL (ref 150–400)
RBC: 3.53 MIL/uL — ABNORMAL LOW (ref 3.87–5.11)
RDW: 15 % (ref 11.5–15.5)
WBC: 11.9 10*3/uL — ABNORMAL HIGH (ref 4.0–10.5)
nRBC: 0 % (ref 0.0–0.2)

## 2019-01-28 LAB — BASIC METABOLIC PANEL
Anion gap: 12 (ref 5–15)
BUN: 28 mg/dL — ABNORMAL HIGH (ref 8–23)
CO2: 23 mmol/L (ref 22–32)
Calcium: 8.8 mg/dL — ABNORMAL LOW (ref 8.9–10.3)
Chloride: 104 mmol/L (ref 98–111)
Creatinine, Ser: 2.84 mg/dL — ABNORMAL HIGH (ref 0.44–1.00)
GFR calc Af Amer: 18 mL/min — ABNORMAL LOW (ref >60)
GFR calc non Af Amer: 16 mL/min — ABNORMAL LOW (ref >60)
Glucose, Bld: 103 mg/dL — ABNORMAL HIGH (ref 70–99)
Potassium: 4.3 mmol/L (ref 3.5–5.1)
Sodium: 139 mmol/L (ref 135–145)

## 2019-01-28 LAB — IRON AND TIBC
Iron: 23 ug/dL — ABNORMAL LOW (ref 28–170)
Saturation Ratios: 8 % — ABNORMAL LOW (ref 10.4–31.8)
TIBC: 301 ug/dL (ref 250–450)
UIBC: 278 ug/dL

## 2019-01-28 LAB — FERRITIN: Ferritin: 45 ng/mL (ref 11–307)

## 2019-01-28 MED ORDER — FUROSEMIDE 10 MG/ML IJ SOLN
40.0000 mg | Freq: Two times a day (BID) | INTRAMUSCULAR | Status: DC
  Administered 2019-01-28: 40 mg via INTRAVENOUS
  Filled 2019-01-28: qty 4

## 2019-01-28 MED ORDER — METOPROLOL TARTRATE 50 MG PO TABS
50.0000 mg | ORAL_TABLET | Freq: Two times a day (BID) | ORAL | Status: DC
  Administered 2019-01-28 – 2019-01-30 (×5): 50 mg via ORAL
  Filled 2019-01-28 (×5): qty 1

## 2019-01-28 NOTE — Progress Notes (Signed)
Patient ID: Jaclyn Day, female   DOB: [birth year], 73 y.o.   MRN: [id number] [facility] Progress Note   Assessment/ Plan:   1. 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 fib with RVR/renal hypoperfusion versus cardiorenal with CHF exacerbation.  Renal function slightly lower overnight with increased diuresis (furosemide 40 mg IV 3 times daily plus metolazone 5 mg).  Discontinue metolazone and adjust furosemide dose with repeat labs again tomorrow. 2.  Acute hypoxemic respiratory failure: Secondary to A. fib with RVR with CHF decompensation.  Clinically appearing to do better with ongoing diuresis. 3.  Atrial fibrillation with rapid ventricular response: Improving with ongoing rate control on diltiazem drip, started on heparin drip for anticoagulation.  2D echocardiogram done earlier today. 4.  Hypertension: Blood pressure marginally elevated, monitor with diuresis. 5.  Anemia: Without overt loss, iron studies ordered..  Subjective:   Reports that she continues to feel better with decreasing shortness of breath and able to ambulate around room without problems.   Objective:   BP 128/75   Pulse 94   Temp 98.4 F (36.9 C) (Oral)   Resp 19   Ht 5\' 5"  (1.651 m)   Wt 88.8 kg   SpO2 93%   BMI 32.58 kg/m   Intake/Output Summary (Last 24 hours) at 01/28/2019 1102 Last data filed at 01/28/2019 0847 Gross per 24 hour  Intake 1223.75 ml  Output 1675 ml  Net -451.25 ml   Weight change: -3.2 kg  Physical Exam: Gen: Comfortably resting in bed, watching television CVS: Pulse irregularly irregular, normal rate, S1 and S2 normal Resp: Diminished breath sounds over bases without distinct rales.  No wheeze Abd: Soft, obese, nontender Ext: 1+ bilateral pedal edema  Imaging: No results found.  Labs: BMET Recent Labs  Lab 01/25/19 1400 01/26/19 0419  01/27/19 0335 01/28/19 0413  NA 143 143 142 139  K 4.4 3.9 4.1 4.3  CL 114* 112* 109 104  CO2 20* 20* 25 23  GLUCOSE 102* 94 110* 103*  BUN 26* 27* 27* 28*  CREATININE 2.23* 2.49* 2.48* 2.84*  CALCIUM 9.0 8.8* 8.8* 8.8*   CBC Recent Labs  Lab 01/25/19 1400 01/26/19 0419 01/27/19 0335 01/28/19 0413  WBC 10.5 9.4 10.9* 11.9*  HGB 10.5* 10.2* 9.9* 10.5*  HCT 34.6* 32.3* 31.7* 34.5*  MCV 98.9 97.6 98.1 97.7  PLT 298 228 230 260    Medications:    . apixaban  2.5 mg Oral BID  . ARIPiprazole  5 mg Oral BID  . brimonidine  1 drop Both Eyes BID  . divalproex  1,000 mg Oral QHS  . dorzolamide-timolol  1 drop Both Eyes BID  . DULoxetine  60 mg Oral Daily  . furosemide  40 mg Intravenous BID  . metoprolol tartrate  50 mg Oral BID   Elmarie Shiley, MD 01/28/2019, 11:02 AM

## 2019-01-28 NOTE — Progress Notes (Signed)
Family Medicine Teaching Service Daily Progress Note Intern Pager: [phone]  Patient name: Jaclyn Day     Medical record number: [id number] Date of birth: 07/02/1946          Age: 73 y.o.    Gender: female  Primary Care Provider: Cyndi Bender, PA-C Consultants: Nephrology Code Status: Full Code  Pt Overview and Major Events to Date:  4/15 admit to FPTS  Assessment and Plan: Jaclyn Day a 73 y.o.femaleadmitted for hypoxiaand diagnosed with new onset Afib. Also found to havepossible HF with BNP 1574.7 w/ orthopnea and DOE. Herchronic conditions include CKD (unilateral kidney- congenital), HTN, CHF  Afib with RVR, improved - new this admission. HR 69 this AM. Transitioning to metoprolol 25 mg BID. Dilt gtt still ordered. Appreciate cards recs. - cont metop - follow up cards recs - eliquis 2.5 mg BID  Acute hypoxemic respiratory failure, improved - hypervolemia and new afib with RVR. UOP -2.4L since admit. Wt 204 lb s >> 195 lbs. Satting well on [facility] Cr bumping at 2.84 from 2.48 yesterday.. - lasix 40 mg IV BID, consider de-escalating today, will follow up nephro recs - wean to room air  Chest pain Resolved, negative trop x3 and normal EKG.  CKD and unilateral kidney Follows with Dr. Justin Day. - avoid nephrotoxic agents -diuresis as above - monitor cr - nephro on board  HTN currently normotensive - dilt and lasix as above - home metop - pharm tech did full med rec with the patient's partner, home meds in the chart are now reliable - unable to get in contact with PCP so far  Osteoarthritis Walks with assisted device at home, will use wheelchair whenever she leaves the house.Patient is a fall risk, and especially more worrisome in the setting of new anticoagulation. Patient reports that she is going to need bilateral knee replacements per previous work-ups.  Out of bed with assistance  Avoid NSAIDS  #Anemia, MCV 98.9  Likely 32/2 CKD.    Trend Hgb   Psychiatric/Mood disorder unspecified. Attempting to contact PCP for problem list. Med rec completed by pharmacy - continue home abilify - continue home depakote - continue home cymbalta  #FEN/GI:   Electrolytes:Monitor with Lasix  Nutrition:Heart healthy, fluid restriction 1200 mL   Disposition: anticipate 1-2 days of hospitalization for diuresis and HR management  Subjective:  No complaints. No acute events overnight. No dyspnea or chest pain.   Objective: Temp:  [98.2 F (36.8 C)-98.6 F (37 C)] 98.4 F (36.9 C) (04/18 1206) Pulse Rate:  [65-94] 89 (04/18 1206) Resp:  [19-25] 19 (04/18 1206) BP: (128-148)/(74-91) 148/91 (04/18 1206) SpO2:  [93 %-97 %] 95 % (04/18 1206) Weight:  [88.8 kg] 88.8 kg (04/18 0700) Physical Exam: General: NAD, appears well Cardiovascular: irregularly irregular, no M/R/G Respiratory: CTA bil, no w/r/r Abdomen: soft and nt, nd Extremities: 0-1+ edema bilaterally  Echo with normal EF and no regional wall motion abnormalities, severe L atrium dilation.    Laboratory: LastLabs       Recent Labs  Lab 01/26/19 0419 01/27/19 0335 01/28/19 0413  WBC 9.4 10.9* 11.9*  HGB 10.2* 9.9* 10.5*  HCT 32.3* 31.7* 34.5*  PLT 228 230 260     LastLabs        Recent Labs  Lab 01/25/19 1433 01/26/19 0419 01/27/19 0335 01/28/19 0413  NA  --  143 142 139  K  --  3.9 4.1 4.3  CL  --  112* 109 104  CO2  --  20* 25 23  BUN  --  27* 27* 28*  CREATININE  --  2.49* 2.48* 2.84*  CALCIUM  --  8.8* 8.8* 8.8*  PROT 5.3*  --   --   --   BILITOT 0.5  --   --   --   ALKPHOS 61  --   --   --   ALT 13  --   --   --   AST 14*  --   --   --   GLUCOSE  --  94 110* 103*     Imaging/Diagnostic Tests: No results found.  Jaclyn Coombe, MD 01/28/2019, 1:33 PM PGY-3, [place] Intern pager: [phone], text pages welcome

## 2019-01-28 NOTE — Progress Notes (Signed)
Progress Note  Patient Name: Jaclyn Day Date of Encounter: 01/28/2019  Primary Cardiologist: Ena Dawley, MD (New)  Subjective   Less dyspnea Great breakfast Has been OOB with assistance will use bedside commode Instead of bed pan today  Inpatient Medications    Scheduled Meds: . apixaban  2.5 mg Oral BID  . ARIPiprazole  5 mg Oral BID  . brimonidine  1 drop Both Eyes BID  . divalproex  1,000 mg Oral QHS  . dorzolamide-timolol  1 drop Both Eyes BID  . DULoxetine  60 mg Oral Daily  . furosemide  40 mg Intravenous Q8H  . metoprolol tartrate  25 mg Oral BID   Continuous Infusions: . diltiazem (CARDIZEM) infusion 5 mg/hr (01/28/19 0722)   PRN Meds: acetaminophen, hydrALAZINE, ondansetron (ZOFRAN) IV   Vital Signs    Vitals:   01/27/19 2000 01/27/19 2111 01/28/19 0300 01/28/19 0700  BP: 140/76 130/81 139/86   Pulse: 65 72 69   Resp: 20  20   Temp: 98.4 F (36.9 C)  98.6 F (37 C)   TempSrc: Oral  Oral   SpO2: 94%  95%   Weight:    88.8 kg  Height:        Intake/Output Summary (Last 24 hours) at 01/28/2019 0811 Last data filed at 01/28/2019 7782 Gross per 24 hour  Intake 582 ml  Output 1675 ml  Net -1093 ml   Filed Weights   01/26/19 0627 01/27/19 0557 01/28/19 0700  Weight: 93.4 kg 92 kg 88.8 kg    Telemetry    Fibrillation with ventricular rate 90-1 10- Personally Reviewed  ECG    No new tracing- Personally Reviewed  Physical Exam   BP 139/86 (BP Location: Left Arm)   Pulse 69   Temp 98.6 F (37 C) (Oral)   Resp 20   Ht 5\' 5"  (1.651 m)   Wt 88.8 kg   SpO2 95%   BMI 32.58 kg/m   Affect appropriate Chronically ill obese female  HEENT: normal Neck supple with no adenopathy JVP normal no bruits no thyromegaly Lungs clear with no wheezing and good diaphragmatic motion Heart:  S1/S2 no murmur, no rub, gallop or click PMI normal Abdomen: benighn, BS positve, no tenderness, no AAA no bruit.  No HSM or HJR Distal pulses intact  with no bruits Plus one bilateral  edema Neuro non-focal Skin warm and dry No muscular weakness  Labs    Chemistry Recent Labs  Lab 01/25/19 1433 01/26/19 0419 01/27/19 0335 01/28/19 0413  NA  --  143 142 139  K  --  3.9 4.1 4.3  CL  --  112* 109 104  CO2  --  20* 25 23  GLUCOSE  --  94 110* 103*  BUN  --  27* 27* 28*  CREATININE  --  2.49* 2.48* 2.84*  CALCIUM  --  8.8* 8.8* 8.8*  PROT 5.3*  --   --   --   ALBUMIN 2.9*  --   --   --   AST 14*  --   --   --   ALT 13  --   --   --   ALKPHOS 61  --   --   --   BILITOT 0.5  --   --   --   GFRNONAA  --  19* 19* 16*  GFRAA  --  22* 22* 18*  ANIONGAP  --  11 8 12      Hematology Recent Labs  Lab 01/26/19 0419 01/27/19 0335 01/28/19 0413  WBC 9.4 10.9* 11.9*  RBC 3.31* 3.23* 3.53*  HGB 10.2* 9.9* 10.5*  HCT 32.3* 31.7* 34.5*  MCV 97.6 98.1 97.7  MCH 30.8 30.7 29.7  MCHC 31.6 31.2 30.4  RDW 15.1 15.0 15.0  PLT 228 230 260    Cardiac Enzymes Recent Labs  Lab 01/25/19 2207 01/26/19 0419 01/26/19 0953  TROPONINI <0.03 <0.03 <0.03    Recent Labs  Lab 01/25/19 1437  TROPIPOC 0.03     BNP Recent Labs  Lab 01/25/19 1433  BNP 1,574.7*     DDimer No results for input(s): DDIMER in the last 168 hours.   Radiology    No results found.  Cardiac Studies   Echocardiogram 01/26/2019: 1. The left ventricle has normal systolic function with an ejection fraction of 60-65%. The cavity size was normal. There is mild concentric left ventricular hypertrophy. Left ventricular diastolic function could not be evaluated secondary to atrial  fibrillation. No evidence of left ventricular regional wall motion abnormalities.  2. The right ventricle has normal systolic function. The cavity was normal. There is no increase in right ventricular wall thickness. Right ventricular systolic pressure could not be assessed.  3. Left atrial size was severely dilated.  4. The interatrial septum appears to be lipomatous.  Patient  Profile    Ms. Cutright is a 73 y.o. female with a history of unilateral kidney (congenital) and hypertension who is being seen today for the evaluation of CHF at the request of Dr. Ardelia Mems.  Assessment & Plan    Acute Diastolic CHF :  With pleural effusion BNP 1574 TTE with EF 60-65% Negative another liter yesterday feels better CRF but Cr stable continue current iv dose for now May have to decrease in am to bid  Atrial Fibrillation Good rate control Renally low dose Eliquis started yesterday will d/c iv cardizem and increase oral beta blocker to 50 bid. Consider TEE/DCC on Monday if she does not continue to improve per Dr Meda Coffee CHADVASC 4 on low dose renally adjusted eliquis   Hypertension  Well controlled.  Continue current medications and low sodium Dash type diet.    CKD with Unilateral Kidney Cr stable likely decrease lasix to bid in am    For questions or updates, please contact [place] Please consult [email or web address] for contact info under Cardiology/STEMI.      Signed, Jenkins Rouge, MD  01/28/2019, 8:11 AM   Pager: [phone]

## 2019-01-28 NOTE — Plan of Care (Signed)
  Problem: Education: Goal: Knowledge of disease or condition will improve Outcome: Progressing   Problem: Activity: Goal: Ability to tolerate increased activity will improve Outcome: Progressing   Problem: Cardiac: Goal: Ability to achieve and maintain adequate cardiopulmonary perfusion will improve Outcome: Progressing   Problem: Education: Goal: Knowledge of General Education information will improve Description Including pain rating scale, medication(s)/side effects and non-pharmacologic comfort measures Outcome: Progressing

## 2019-01-29 LAB — CBC
HCT: 35.9 % — ABNORMAL LOW (ref 36.0–46.0)
Hemoglobin: 10.8 g/dL — ABNORMAL LOW (ref 12.0–15.0)
MCH: 29.3 pg (ref 26.0–34.0)
MCHC: 30.1 g/dL (ref 30.0–36.0)
MCV: 97.3 fL (ref 80.0–100.0)
Platelets: 258 10*3/uL (ref 150–400)
RBC: 3.69 MIL/uL — ABNORMAL LOW (ref 3.87–5.11)
RDW: 14.7 % (ref 11.5–15.5)
WBC: 12.5 10*3/uL — ABNORMAL HIGH (ref 4.0–10.5)
nRBC: 0 % (ref 0.0–0.2)

## 2019-01-29 LAB — RENAL FUNCTION PANEL
Albumin: 2.8 g/dL — ABNORMAL LOW (ref 3.5–5.0)
Anion gap: 12 (ref 5–15)
BUN: 31 mg/dL — ABNORMAL HIGH (ref 8–23)
CO2: 24 mmol/L (ref 22–32)
Calcium: 8.7 mg/dL — ABNORMAL LOW (ref 8.9–10.3)
Chloride: 101 mmol/L (ref 98–111)
Creatinine, Ser: 2.74 mg/dL — ABNORMAL HIGH (ref 0.44–1.00)
GFR calc Af Amer: 19 mL/min — ABNORMAL LOW (ref >60)
GFR calc non Af Amer: 17 mL/min — ABNORMAL LOW (ref >60)
Glucose, Bld: 97 mg/dL (ref 70–99)
Phosphorus: 4.9 mg/dL — ABNORMAL HIGH (ref 2.5–4.6)
Potassium: 4.4 mmol/L (ref 3.5–5.1)
Sodium: 137 mmol/L (ref 135–145)

## 2019-01-29 MED ORDER — SODIUM CHLORIDE 0.9 % IV SOLN
510.0000 mg | Freq: Once | INTRAVENOUS | Status: AC
  Administered 2019-01-29: 510 mg via INTRAVENOUS
  Filled 2019-01-29: qty 17

## 2019-01-29 MED ORDER — FUROSEMIDE 40 MG PO TABS
40.0000 mg | ORAL_TABLET | Freq: Two times a day (BID) | ORAL | Status: DC
  Administered 2019-01-29 – 2019-01-30 (×3): 40 mg via ORAL
  Filled 2019-01-29 (×3): qty 1

## 2019-01-29 NOTE — Progress Notes (Signed)
Patient refused CPAP.

## 2019-01-29 NOTE — Progress Notes (Signed)
Progress Note  Patient Name: Jaclyn Day Date of Encounter: 01/29/2019  Primary Cardiologist: Jaclyn Dawley, MD (New)  Subjective   Less dyspnea Great breakfast Has been OOB with assistance will use bedside commode Instead of bed pan today  Inpatient Medications    Scheduled Meds: . apixaban  2.5 mg Oral BID  . ARIPiprazole  5 mg Oral BID  . brimonidine  1 drop Both Eyes BID  . divalproex  1,000 mg Oral QHS  . dorzolamide-timolol  1 drop Both Eyes BID  . DULoxetine  60 mg Oral Daily  . furosemide  40 mg Intravenous BID  . metoprolol tartrate  50 mg Oral BID   Continuous Infusions:  PRN Meds: acetaminophen, hydrALAZINE, ondansetron (ZOFRAN) IV   Vital Signs    Vitals:   01/28/19 1747 01/28/19 2026 01/29/19 0003 01/29/19 0349  BP: (!) 143/91 (!) 139/92 (!) 135/103 (!) 154/117  Pulse: 89 (!) 106 88 (!) 35  Resp: 17 19 (!) 31 (!) 26  Temp: 98.3 F (36.8 C) 98.2 F (36.8 C)  98.2 F (36.8 C)  TempSrc: Oral Oral  Oral  SpO2: 98% 94% 97% 98%  Weight:    87.2 kg  Height:        Intake/Output Summary (Last 24 hours) at 01/29/2019 0807 Last data filed at 01/29/2019 0048 Gross per 24 hour  Intake 1445.75 ml  Output 2050 ml  Net -604.25 ml   Filed Weights   01/27/19 0557 01/28/19 0700 01/29/19 0349  Weight: 92 kg 88.8 kg 87.2 kg    Telemetry    Fibrillation with ventricular rate 90-1 10- Personally Reviewed  ECG    No new tracing- Personally Reviewed  Physical Exam   BP (!) 154/117 (BP Location: Left Arm)   Pulse (!) 35   Temp 98.2 F (36.8 C) (Oral)   Resp (!) 26   Ht 5\' 5"  (1.651 m)   Wt 87.2 kg   SpO2 98%   BMI 31.99 kg/m   Affect appropriate Chronically ill obese female  HEENT: normal Neck supple with no adenopathy JVP normal no bruits no thyromegaly Lungs clear with no wheezing and good diaphragmatic motion Heart:  S1/S2 no murmur, no rub, gallop or click PMI normal Abdomen: benighn, BS positve, no tenderness, no AAA no bruit.   No HSM or HJR Distal pulses intact with no bruits Plus one bilateral  edema Neuro non-focal Skin warm and dry No muscular weakness  Labs    Chemistry Recent Labs  Lab 01/25/19 1433  01/27/19 0335 01/28/19 0413 01/29/19 0341  NA  --    < > 142 139 137  K  --    < > 4.1 4.3 4.4  CL  --    < > 109 104 101  CO2  --    < > 25 23 24   GLUCOSE  --    < > 110* 103* 97  BUN  --    < > 27* 28* 31*  CREATININE  --    < > 2.48* 2.84* 2.74*  CALCIUM  --    < > 8.8* 8.8* 8.7*  PROT 5.3*  --   --   --   --   ALBUMIN 2.9*  --   --   --  2.8*  AST 14*  --   --   --   --   ALT 13  --   --   --   --   ALKPHOS 61  --   --   --   --  BILITOT 0.5  --   --   --   --   GFRNONAA  --    < > 19* 16* 17*  GFRAA  --    < > 22* 18* 19*  ANIONGAP  --    < > 8 12 12    < > = values in this interval not displayed.     Hematology Recent Labs  Lab 01/27/19 0335 01/28/19 0413 01/29/19 0341  WBC 10.9* 11.9* 12.5*  RBC 3.23* 3.53* 3.69*  HGB 9.9* 10.5* 10.8*  HCT 31.7* 34.5* 35.9*  MCV 98.1 97.7 97.3  MCH 30.7 29.7 29.3  MCHC 31.2 30.4 30.1  RDW 15.0 15.0 14.7  PLT 230 260 258    Cardiac Enzymes Recent Labs  Lab 01/25/19 2207 01/26/19 0419 01/26/19 0953  TROPONINI <0.03 <0.03 <0.03    Recent Labs  Lab 01/25/19 1437  TROPIPOC 0.03     BNP Recent Labs  Lab 01/25/19 1433  BNP 1,574.7*     DDimer No results for input(s): DDIMER in the last 168 hours.   Radiology    No results found.  Cardiac Studies   Echocardiogram 01/26/2019: 1. The left ventricle has normal systolic function with an ejection fraction of 60-65%. The cavity size was normal. There is mild concentric left ventricular hypertrophy. Left ventricular diastolic function could not be evaluated secondary to atrial  fibrillation. No evidence of left ventricular regional wall motion abnormalities.  2. The right ventricle has normal systolic function. The cavity was normal. There is no increase in right ventricular wall  thickness. Right ventricular systolic pressure could not be assessed.  3. Left atrial size was severely dilated.  4. The interatrial septum appears to be lipomatous.  Patient Profile    Jaclyn Day is a 73 y.o. female with a history of unilateral kidney (congenital) and hypertension who is being seen today for the evaluation of CHF at the request of Dr. Ardelia Mems.  Assessment & Plan    Acute Diastolic CHF :  With pleural effusion BNP 1574 TTE with EF 60-65% Negative another 600 cc yesterday feels better CRF CR up a bit 2.7 will change to PO lasix 40 bid   Atrial Fibrillation Good rate control Renally low dose Eliquis started yesterday will d/c iv cardizem and increase oral beta blocker to 50 bid. Consider TEE/DCC on Monday if she does not continue to improve per Dr Meda Coffee CHADVASC 4 on low dose renally adjusted eliquis will keep NPO in am TEE/DCC has not been arranged   Hypertension  Well controlled.  Continue current medications and low sodium Dash type diet.    CKD with Unilateral Kidney Cr 2.74 slightly up from baseline around 2.4    For questions or updates, please contact [place] Please consult [email or web address] for contact info under Cardiology/STEMI.      Signed, Jenkins Rouge, MD  01/29/2019, 8:07 AM   Pager: [phone]

## 2019-01-29 NOTE — Progress Notes (Addendum)
Family Medicine Teaching Service Daily Progress Note Intern Pager: [phone]  Patient name: Jaclyn Day Medical record number: [id number] Date of birth: 13-Apr-1946 Age: 73 y.o. Gender: female  Primary Care Provider: Cyndi Bender, PA-C Consultants: Nephrology  Code Status: Full Code   Pt Overview and Major Events to Date:  Admitted: 01/25/2019 for CC: Shortness of Breath; Chest Pain; and Atrial Fibrillation  Hospital Day: 5  Assessment and Plan: Jaclyn Day is a 73 y.o. female who presented w/ Shortness of Breath; Chest Pain; and new onset Atrial Fibrillation  [name]  has a past medical history of Hypertension and Renal disorder.  #A. fib with RVR New, improved In A. fib this morning on EKG, heart rate 96.  Unclear etiology of A. fib. Discontinued cardizem gtt.  Continue apixaban twice daily  Cards recs for metoprolol 50 mg twice daily  Possible DCC/TEE on 4/20 per cardiology   Transfer pt from progressive to tele  #Acute hypoxemic respiratory failure, improved Total net -6 L.  Admission weight 95.2, currently 86.3 kg.  Echocardiogram on 01/26/2019 showed ejection fraction 60 to 65% with mild concentric left ventricular hypertrophy.  Has been taking 40 mg IV Lasix twice daily for diuresis and responding well. Patient feeling much improved from breathing perspective. Very mild crackles on lung.   Transition IV to p.o. Lasix  Walk test with assistive device after DCC/TEE   #CKD and unilateral kidney Follows with Dr. Justin Mend  Appreciate nephrology recs  Transition to PO lasix today   #Hypertension 135-156/90-117 range in last 24 hours.   Continue metoprolol  Continue to monitor in w/ diuresis  #Osteoarthritis B/L disease. Limits acitivity with assistive device at home and wheelchair when leaving home.    OOB w/ assistance only   PT recs for [facility] PT with assistance 24 hours.   #Anemia Likely due to CKD. Iron low.  IV Iron load per neprhology    #Psych/mood disorder   Continue abilify, depakote, cymbalta   #FEN/GI:  . Electrolytes: Within normal limits . Nutrition: Heart healthy, fluid restriction 1200 mL  Access: Right AC, 4/15, pure wick VTE prophylaxis: Apixaban 2.5 mg twice daily  Disposition: PT recs for HH.  ================================================================ Subjective:  NAEO.   Objective: BP (!) 156/101 (BP Location: Right Arm)   Pulse 65   Temp 98.1 F (36.7 C) (Oral)   Resp (!) 26   Ht 5\' 5"  (1.651 m)   Wt 87.2 kg   SpO2 100%   BMI 31.99 kg/m  Intake/Output      04/18 0701 - 04/19 0700 04/19 0701 - 04/20 0700   P.O. 1044    I.V. (mL/kg) 401.8 (4.6)    Total Intake(mL/kg) 1445.8 (16.6)    Urine (mL/kg/hr) 3050 (1.5)    Stool 0    Total Output 3050    Net -1604.3         Stool Occurrence 1 x     Physical Exam: General: NAD, non-toxic, well-appearing, sitting comfortably in bed.  Cardiovascular: RRR, normal S1, S2. 2+ RP bilaterally. Very mild BLEE.  Respiratory: CTAB. No IWOB. Mild crackles at bases.  Abdomen: + BS. NT, ND, soft to palpation.  Extremities: Warm and well perfused.   Laboratory: I have personally read and reviewed all labs and imaging studies.  CBC: Recent Labs  Lab 01/25/19 1400 01/26/19 0419 01/27/19 0335 01/28/19 0413 01/29/19 0341  WBC 10.5 9.4 10.9* 11.9* 12.5*  HGB 10.5* 10.2* 9.9* 10.5* 10.8*  HCT 34.6* 32.3* 31.7* 34.5* 35.9*  MCV  98.9 97.6 98.1 97.7 97.3  PLT 298 228 230 260 582   Basic Metabolic Panel: Recent Labs  Lab 01/25/19 1400 01/26/19 0419 01/27/19 0335 01/28/19 0413 01/29/19 0341  NA 143 143 142 139 137  K 4.4 3.9 4.1 4.3 4.4  CL 114* 112* 109 104 101  CO2 20* 20* 25 23 24   GLUCOSE 102* 94 110* 103* 97  BUN 26* 27* 27* 28* 31*  CREATININE 2.23* 2.49* 2.48* 2.84* 2.74*  CALCIUM 9.0 8.8* 8.8* 8.8* 8.7*  PHOS  --   --   --   --  4.9*   GFR: Estimated Creatinine Clearance: 20.2 mL/min (A) (by C-G formula based on SCr of 2.74  mg/dL (H)). Liver Function Tests: Recent Labs  Lab 01/25/19 1433 01/29/19 0341  AST 14*  --   ALT 13  --   ALKPHOS 61  --   BILITOT 0.5  --   PROT 5.3*  --   ALBUMIN 2.9* 2.8*   Anemia Panel: Recent Labs    01/28/19 1325  FERRITIN 45  TIBC 301  IRON 23*   Imaging/Diagnostic Tests: No results found.   Wilber Oliphant, MD 01/29/2019, 9:08 AM PGY-1, [place] Intern pager: [phone], text pages welcome

## 2019-01-29 NOTE — Progress Notes (Signed)
Patient ID: Jaclyn Day, female   DOB: 01-30-1946, 73 y.o.   MRN: [id number] [facility] Progress Note   Assessment/ Plan:   1. 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 fib with RVR/renal hypoperfusion versus cardiorenal with CHF exacerbation.  Renal function slightly better with decreased diuresis-converted to oral furosemide today. 2.  Acute hypoxemic respiratory failure: Secondary to A. fib with RVR with CHF decompensation.  Clinically appearing to do better with ongoing diuresis. 3.  Atrial fibrillation with rapid ventricular response: Improving with ongoing rate control on diltiazem drip, on anticoagulation with Eliquis 4.  Hypertension: Blood pressure marginally elevated, continue to monitor with volume unloading. 5.  Anemia: Without overt loss, depressed iron stores noted, begin intravenous iron loading series.  Subjective:   Reports continued improvement of shortness of breath and ability to ambulate around room.   Objective:   BP 127/90 (BP Location: Left Arm)   Pulse 65   Temp 98.1 F (36.7 C) (Oral)   Resp (!) 26   Ht 5\' 5"  (1.651 m)   Wt 86.3 kg   SpO2 100%   BMI 31.67 kg/m   Intake/Output Summary (Last 24 hours) at 01/29/2019 1045 Last data filed at 01/29/2019 0048 Gross per 24 hour  Intake 684 ml  Output 2050 ml  Net -1366 ml   Weight change: -1.6 kg  Physical Exam: Gen: Comfortably napping in bed CVS: Pulse irregularly irregular, normal rate, S1 and S2 normal Resp: Clear to auscultation without rales/rhonchi.  No wheeze Abd: Soft, obese, nontender Ext: 1+ bilateral pedal edema  Imaging: No results found.  Labs: BMET Recent Labs  Lab 01/25/19 1400 01/26/19 0419 01/27/19 0335 01/28/19 0413 01/29/19 0341  NA 143 143 142 139 137  K 4.4 3.9 4.1 4.3 4.4  CL 114* 112* 109 104 101  CO2 20* 20* 25 23 24   GLUCOSE  102* 94 110* 103* 97  BUN 26* 27* 27* 28* 31*  CREATININE 2.23* 2.49* 2.48* 2.84* 2.74*  CALCIUM 9.0 8.8* 8.8* 8.8* 8.7*  PHOS  --   --   --   --  4.9*   CBC Recent Labs  Lab 01/26/19 0419 01/27/19 0335 01/28/19 0413 01/29/19 0341  WBC 9.4 10.9* 11.9* 12.5*  HGB 10.2* 9.9* 10.5* 10.8*  HCT 32.3* 31.7* 34.5* 35.9*  MCV 97.6 98.1 97.7 97.3  PLT 228 230 260 258    Medications:    . apixaban  2.5 mg Oral BID  . ARIPiprazole  5 mg Oral BID  . brimonidine  1 drop Both Eyes BID  . divalproex  1,000 mg Oral QHS  . dorzolamide-timolol  1 drop Both Eyes BID  . DULoxetine  60 mg Oral Daily  . furosemide  40 mg Oral BID  . metoprolol tartrate  50 mg Oral BID   Elmarie Shiley, MD 01/29/2019, 10:45 AM

## 2019-01-30 LAB — CBC
HCT: 35.4 % — ABNORMAL LOW (ref 36.0–46.0)
Hemoglobin: 11.2 g/dL — ABNORMAL LOW (ref 12.0–15.0)
MCH: 30.9 pg (ref 26.0–34.0)
MCHC: 31.6 g/dL (ref 30.0–36.0)
MCV: 97.8 fL (ref 80.0–100.0)
Platelets: 215 10*3/uL (ref 150–400)
RBC: 3.62 MIL/uL — ABNORMAL LOW (ref 3.87–5.11)
RDW: 14.5 % (ref 11.5–15.5)
WBC: 10.5 10*3/uL (ref 4.0–10.5)
nRBC: 0 % (ref 0.0–0.2)

## 2019-01-30 LAB — RENAL FUNCTION PANEL
Albumin: 2.7 g/dL — ABNORMAL LOW (ref 3.5–5.0)
Anion gap: 13 (ref 5–15)
BUN: 37 mg/dL — ABNORMAL HIGH (ref 8–23)
CO2: 30 mmol/L (ref 22–32)
Calcium: 8.8 mg/dL — ABNORMAL LOW (ref 8.9–10.3)
Chloride: 98 mmol/L (ref 98–111)
Creatinine, Ser: 2.9 mg/dL — ABNORMAL HIGH (ref 0.44–1.00)
GFR calc Af Amer: 18 mL/min — ABNORMAL LOW (ref >60)
GFR calc non Af Amer: 16 mL/min — ABNORMAL LOW (ref >60)
Glucose, Bld: 93 mg/dL (ref 70–99)
Phosphorus: 5.7 mg/dL — ABNORMAL HIGH (ref 2.5–4.6)
Potassium: 3.7 mmol/L (ref 3.5–5.1)
Sodium: 141 mmol/L (ref 135–145)

## 2019-01-30 MED ORDER — SODIUM CHLORIDE 0.9 % IV SOLN
INTRAVENOUS | Status: DC
  Administered 2019-01-30 – 2019-01-31 (×4): via INTRAVENOUS

## 2019-01-30 MED ORDER — FUROSEMIDE 40 MG PO TABS
40.0000 mg | ORAL_TABLET | Freq: Every day | ORAL | Status: DC
  Administered 2019-01-31: 40 mg via ORAL
  Filled 2019-01-30: qty 1

## 2019-01-30 MED ORDER — APIXABAN 5 MG PO TABS
5.0000 mg | ORAL_TABLET | Freq: Two times a day (BID) | ORAL | Status: DC
  Administered 2019-01-30 – 2019-01-31 (×3): 5 mg via ORAL
  Filled 2019-01-30 (×3): qty 1

## 2019-01-30 MED ORDER — METOPROLOL TARTRATE 50 MG PO TABS
50.0000 mg | ORAL_TABLET | Freq: Two times a day (BID) | ORAL | Status: DC
  Administered 2019-01-30 – 2019-01-31 (×2): 50 mg via ORAL
  Filled 2019-01-30 (×2): qty 1

## 2019-01-30 MED ORDER — AMLODIPINE BESYLATE 10 MG PO TABS
10.0000 mg | ORAL_TABLET | Freq: Every day | ORAL | Status: DC
  Administered 2019-01-30 – 2019-01-31 (×2): 10 mg via ORAL
  Filled 2019-01-30 (×2): qty 1

## 2019-01-30 NOTE — Progress Notes (Signed)
Pt refuse NIV for the night, patient states that she does not use it.

## 2019-01-30 NOTE — Progress Notes (Addendum)
Telephone Encounter   [facility], PCP- PA Cyndi Bender to confirm medications.  Crossed out indicates that patient is receiving here.   Active Meds:       Hydralazine 25 mg BID Atorvastatin 20 mg once daily  Valsartan 320 mg daily  Hydroxyzine 10mg  TID

## 2019-01-30 NOTE — Progress Notes (Signed)
Physical Therapy Treatment Patient Details Name: Jaclyn Day MRN: [id number] DOB: 22-Dec-1945 Today's Date: 01/30/2019    History of Present Illness 73 y.o. female admitted for hypoxia with new onset AFib and acute on chronic CHF. PMHx: unilateral kidney (congenital), bil knee OA and HTN     PT Comments    PT very pleasant and willing to walk and move this session. Pt able to progress to 2 sessions of limited gait but could not tolerate further due to pain in knees. Pt educated for HEp and continued progression of mobility. Will continue to follow.     Follow Up Recommendations  Home health PT;Supervision/Assistance - 24 hour     Equipment Recommendations  None recommended by PT    Recommendations for Other Services       Precautions / Restrictions Precautions Precautions: Fall Precaution Comments: watch sats and HR Restrictions Weight Bearing Restrictions: No    Mobility  Bed Mobility Overal bed mobility: Modified Independent Bed Mobility: Supine to Sit     Supine to sit: HOB elevated     General bed mobility comments: with use of rail and HOB 20 degrees pt able to pivot to EOB without physical assist  Transfers Overall transfer level: Needs assistance   Transfers: Sit to/from Stand Sit to Stand: Min guard         General transfer comment: cues for hand placement, posture, safety and position in RW. PT stood from bed and chair with armrests  Ambulation/Gait Ambulation/Gait assistance: Min guard Gait Distance (Feet): 15 Feet Assistive device: Rolling walker (2 wheeled) Gait Pattern/deviations: Step-through pattern;Trunk flexed;Decreased stride length   Gait velocity interpretation: <1.8 ft/sec, indicate of risk for recurrent falls General Gait Details: cues for posture and position in RW with pt maintaining 90-93% on RA. PT walked 15'x 2 with seated rest between   Stairs             Wheelchair Mobility    Modified Rankin (Stroke Patients  Only)       Balance Overall balance assessment: Needs assistance;History of Falls   Sitting balance-Leahy Scale: Fair       Standing balance-Leahy Scale: Poor Standing balance comment: bil UE support in standing on RW                            Cognition Arousal/Alertness: Awake/alert Behavior During Therapy: Flat affect Overall Cognitive Status: No family/caregiver present to determine baseline cognitive functioning                       Memory: Decreased short-term memory   Safety/Judgement: Decreased awareness of safety;Decreased awareness of deficits   Problem Solving: Slow processing;Requires verbal cues        Exercises General Exercises - Lower Extremity Short Arc Quad: AROM;15 reps;Seated;Both Hip Flexion/Marching: AROM;10 reps;Seated;Both    General Comments        Pertinent Vitals/Pain Pain Assessment: No/denies pain    Home Living                      Prior Function            PT Goals (current goals can now be found in the care plan section) Progress towards PT goals: Progressing toward goals    Frequency           PT Plan Current plan remains appropriate    Co-evaluation  AM-PAC PT "6 Clicks" Mobility   Outcome Measure  Help needed turning from your back to your side while in a flat bed without using bedrails?: A Little Help needed moving from lying on your back to sitting on the side of a flat bed without using bedrails?: A Little Help needed moving to and from a bed to a chair (including a wheelchair)?: A Little Help needed standing up from a chair using your arms (e.g., wheelchair or bedside chair)?: A Little Help needed to walk in hospital room?: A Little Help needed climbing 3-5 steps with a railing? : Total 6 Click Score: 16    End of Session Equipment Utilized During Treatment: Gait belt Activity Tolerance: Patient tolerated treatment well Patient left: in chair;with call  bell/phone within reach;with chair alarm set Nurse Communication: Mobility status PT Visit Diagnosis: Other abnormalities of gait and mobility (R26.89);Muscle weakness (generalized) (M62.81);Unsteadiness on feet (R26.81);History of falling (Z91.81)     Time: 4665-9935 PT Time Calculation (min) (ACUTE ONLY): 13 min  Charges:  $Gait Training: 8-22 mins                     Tamisha Nordstrom Pam Drown, PT Acute Rehabilitation Services Pager: [phone] Office: [phone]    Sandy Salaam Trystan Akhtar 01/30/2019, 11:24 AM

## 2019-01-30 NOTE — Plan of Care (Signed)
  Problem: Education: Goal: Knowledge of General Education information will improve Description Including pain rating scale, medication(s)/side effects and non-pharmacologic comfort measures Outcome: Progressing   

## 2019-01-30 NOTE — Progress Notes (Addendum)
Family Medicine Teaching Service Daily Progress Note Intern Pager: [phone]  Patient name: Jaclyn Day Medical record number: [id number] Date of birth: [birth year] Age: 73 y.o. Gender: female  Primary Care Provider: Cyndi Bender, PA-C Consultants: Nephrology  Code Status: Full Code   Pt Overview and Major Events to Date:  Admitted: 01/25/2019 for CC: Shortness of Breath; Chest Pain; and Atrial Fibrillation  Hospital Day: 6  Assessment and Plan: Jaclyn Day is a 73 y.o. female who presented w/ Shortness of Breath; Chest Pain; and new onset Atrial Fibrillation. Jaclyn Day  has a past medical history of Hypertension and Renal disorder.  #A. fib with RVR New, stable Remains in a fib this morning, rate 84.   Increase apixaban 5mg  to twice daily  Will proceed with TEE tomorrow - 4/21  Continue metoprolol 50 mg BID  #Acute hypoxemic respiratory failure 2/2 Hypervolemic decompensated HF  Total net -7 L.  Admission weight 95.2, currently 85.8 kg, nearly 10 kg.  Switched to oral lasix 40 mg yesterday morning. Patient with 900cc in last 12 hours. No crackles and no BLEE on exam today. Can consider decreasing lasix or stopping. She was placed on O2 overnight after refusing CPAP (which she does not use at home any more). She is on room air this morning and is satting well.   Decrease to PO lasix 40 daily  O2 only if sats sustained <92%  Walk test with assistive device after TEE  #CKD and unilateral kidney Follows with Dr. Justin Mend. Creatinine stable at 2.9, calcium 8.8 (corrected- 9.8), phosphorus 5.7, GFR 16  Appreciate nephrology recs  Continue PO lasix as above  AM RFP   Hold home valsartan 320 daily   #Hypertension 131-151/87-107 range in last 24 hours. Patient also uses hydralazine 25mg  BID, Valsartan 320 mg daily, hydroxyzine 10mg  BID.   Continue metoprolol  Add home amlodipine 10mg   Continue to monitor in w/ diuresis  Can add back home meds as  required.  #Osteoarthritis B/L disease. Limits acitivity with assistive device at home and wheelchair when leaving home.    OOB w/ assistance only   PT recs for [facility] PT with assistance 24 hours.   #Anemia Likely due to CKD. Iron low. Hemoglobin trending upwards. 11.2 today. S/p feraheme 510mg  x 1 yesterday  S/p Feraheme  #Psych/mood disorder   Continue abilify, depakote, cymbalta   #HLD  Holding home atorvastatin 20mg  daily   #FEN/GI:  . Electrolytes: Within normal limits . Nutrition: Heart healthy, fluid restriction 1200 mL, NPO at midnight  Access: Right AC, 4/15, pure wick VTE prophylaxis: Apixaban 5.0 mg twice daily  Disposition: PT recs for HH, TEE tomorrow. Dispo per cardiology  ================================================================ Subjective:  NAEO.   Objective: BP (!) 151/98   Pulse 95   Temp (!) 97.5 F (36.4 C) (Oral)   Resp (!) 26   Ht 5\' 5"  (1.651 m)   Wt 85.8 kg   SpO2 100%   BMI 31.48 kg/m  Intake/Output      04/19 0701 - 04/20 0700 04/20 0701 - 04/21 0700   P.O. 864    I.V. (mL/kg) 0 (0)    Total Intake(mL/kg) 864 (10.1)    Urine (mL/kg/hr) 1150 (0.6)    Stool     Total Output 1150    Net -286         Urine Occurrence 1 x     Physical Exam: General: NAD, non-toxic, well-appearing, sitting up in bed.  Cardiovascular: RRR, normal S1, S2. 2+ RP bilaterally.  No BLEE. Respiratory: CTAB. No IWOB. Breathing comfortably on room air.  Abdomen: + BS. NT, ND, soft to palpation.  Extremities: Warm and well perfused. Moving spontaneously.   Laboratory: I have personally read and reviewed all labs and imaging studies.  CBC: Recent Labs  Lab 01/26/19 0419 01/27/19 0335 01/28/19 0413 01/29/19 0341 01/30/19 0630  WBC 9.4 10.9* 11.9* 12.5* 10.5  HGB 10.2* 9.9* 10.5* 10.8* 11.2*  HCT 32.3* 31.7* 34.5* 35.9* 35.4*  MCV 97.6 98.1 97.7 97.3 97.8  PLT 228 230 260 258 211   Basic Metabolic Panel: Recent Labs  Lab 01/26/19 0419  01/27/19 0335 01/28/19 0413 01/29/19 0341 01/30/19 0630  NA 143 142 139 137 141  K 3.9 4.1 4.3 4.4 3.7  CL 112* 109 104 101 98  CO2 20* 25 23 24 30   GLUCOSE 94 110* 103* 97 93  BUN 27* 27* 28* 31* 37*  CREATININE 2.49* 2.48* 2.84* 2.74* 2.90*  CALCIUM 8.8* 8.8* 8.8* 8.7* 8.8*  PHOS  --   --   --  4.9* 5.7*   GFR: Estimated Creatinine Clearance: 19 mL/min (A) (by C-G formula based on SCr of 2.9 mg/dL (H)). Liver Function Tests: Recent Labs  Lab 01/25/19 1433 01/29/19 0341 01/30/19 0630  AST 14*  --   --   ALT 13  --   --   ALKPHOS 61  --   --   BILITOT 0.5  --   --   PROT 5.3*  --   --   ALBUMIN 2.9* 2.8* 2.7*   Imaging/Diagnostic Tests: No results found.   Wilber Oliphant, MD 01/30/2019, 8:31 AM PGY-1, [place] Intern pager: [phone], text pages welcome

## 2019-01-30 NOTE — TOC Benefit Eligibility Note (Signed)
Transition of Care [facility]) Benefit Eligibility Note    Patient Details  Name: Jaclyn Day MRN: [id number] Date of Birth: 1946/09/14   Medication/Dose: [name]  5 MG  BID(APIXABAN : NON-FORMULARY)     Tier: 3 Drug  Prescription Coverage Preferred Pharmacy: YES([facility])  Spoke with Person/Company/Phone Number:: LESTACEY([employer] # [phone] )  Co-Pay: 25 % OF TOTAL COST  Prior Approval: No  Deductible: Met(OUT-OF-POCKT: NOT MET)  Additional Notes: SECONDARY INS: MEDICAID [place] ACCESS   EFF-DATE 12-10-2017  CO-PAY- $ 3.90 FOR EACH PRESCRIPTION    Memory Argue Phone Number: 01/30/2019, 4:10 PM

## 2019-01-30 NOTE — Progress Notes (Signed)
PT Cancellation Note  Patient Details Name: Jaclyn Day MRN: [id number] DOB: 05/22/1946   Cancelled Treatment:    Reason Eval/Treat Not Completed: Other (comment)(attempted to see x 2 pt bathing then eating. )   Quinci Gavidia B Regenia Erck 01/30/2019, 9:24 AM  [place] Pager: [phone] Office: [phone]

## 2019-01-30 NOTE — Care Management Important Message (Signed)
Important Message  Patient Details  Name: Jaclyn Day MRN: [id number] Date of Birth: 06-Oct-1946   Medicare Important Message Given:  Yes    [name] Montine Circle 01/30/2019, 3:43 PM

## 2019-01-30 NOTE — Progress Notes (Signed)
Progress Note  Patient Name: Jaclyn Day Date of Encounter: 01/30/2019  Primary Cardiologist: Ena Dawley, MD   Subjective   Denies dyspnea or CP  Inpatient Medications    Scheduled Meds: . apixaban  2.5 mg Oral BID  . ARIPiprazole  5 mg Oral BID  . brimonidine  1 drop Both Eyes BID  . divalproex  1,000 mg Oral QHS  . dorzolamide-timolol  1 drop Both Eyes BID  . DULoxetine  60 mg Oral Daily  . furosemide  40 mg Oral BID  . metoprolol tartrate  50 mg Oral BID   Continuous Infusions:  PRN Meds: acetaminophen, hydrALAZINE, ondansetron (ZOFRAN) IV   Vital Signs    Vitals:   01/29/19 1642 01/29/19 1957 01/29/19 2357 01/30/19 0537  BP: (!) 148/104 131/87 (!) 151/99 (!) 146/107  Pulse: 72 71 91 74  Resp:      Temp: 98.2 F (36.8 C) 97.6 F (36.4 C) (!) 97.5 F (36.4 C) (!) 97.5 F (36.4 C)  TempSrc: Oral Oral Oral Oral  SpO2: 93% 94% 99% 100%  Weight:    85.8 kg  Height:        Intake/Output Summary (Last 24 hours) at 01/30/2019 0748 Last data filed at 01/29/2019 2100 Gross per 24 hour  Intake 864 ml  Output 1150 ml  Net -286 ml   Last 3 Weights 01/30/2019 01/29/2019 01/29/2019  Weight (lbs) 189 lb 2.5 oz 190 lb 4.8 oz 192 lb 3.9 oz  Weight (kg) 85.8 kg 86.32 kg 87.2 kg      Telemetry    Atrial fibrillation; rate  - Personally Reviewed  Physical Exam   GEN: No acute distress.   Neck: No JVD Cardiac: irregular Respiratory: Mildly diminished BS bases GI: Soft, nontender, non-distended  MS: No edema Neuro:  Nonfocal  Psych: Normal affect   Labs    Chemistry Recent Labs  Lab 01/25/19 1433  01/28/19 0413 01/29/19 0341 01/30/19 0630  NA  --    < > 139 137 141  K  --    < > 4.3 4.4 3.7  CL  --    < > 104 101 98  CO2  --    < > 23 24 30   GLUCOSE  --    < > 103* 97 93  BUN  --    < > 28* 31* 37*  CREATININE  --    < > 2.84* 2.74* 2.90*  CALCIUM  --    < > 8.8* 8.7* 8.8*  PROT 5.3*  --   --   --   --   ALBUMIN 2.9*  --   --  2.8* 2.7*   AST 14*  --   --   --   --   ALT 13  --   --   --   --   ALKPHOS 61  --   --   --   --   BILITOT 0.5  --   --   --   --   GFRNONAA  --    < > 16* 17* 16*  GFRAA  --    < > 18* 19* 18*  ANIONGAP  --    < > 12 12 13    < > = values in this interval not displayed.     Hematology Recent Labs  Lab 01/28/19 0413 01/29/19 0341 01/30/19 0630  WBC 11.9* 12.5* 10.5  RBC 3.53* 3.69* 3.62*  HGB 10.5* 10.8* 11.2*  HCT 34.5* 35.9* 35.4*  MCV 97.7 97.3 97.8  MCH 29.7 29.3 30.9  MCHC 30.4 30.1 31.6  RDW 15.0 14.7 14.5  PLT 260 258 215    Cardiac Enzymes Recent Labs  Lab 01/25/19 2207 01/26/19 0419 01/26/19 0953  TROPONINI <0.03 <0.03 <0.03    Recent Labs  Lab 01/25/19 1437  TROPIPOC 0.03     BNP Recent Labs  Lab 01/25/19 1433  BNP 1,574.7*     Patient Profile     73 y.o. female with past medical history of unilateral kidney, hypertension, chronic stage III kidney disease admitted with congestive heart failure and new onset atrial fibrillation.  Echocardiogram showed normal LV function, mild left ventricular hypertrophy and severe left atrial enlargement.  Assessment & Plan    1 persistent atrial fibrillation-patient remains in atrial fibrillation today.  Continue metoprolol for rate control and dose appears to be adequate.  She is on apixaban but is under dosed at 2.5 mg daily (she weighs greater than 60 kg and is less than 21 years old despite creatinine 2.9).  Increase to 5 mg twice daily.  We will plan to proceed with TEE guided cardioversion tomorrow for improvement in congestive heart failure and renal perfusion.  2 acute diastolic congestive heart failure-volume status has improved.  We will continue Lasix at present dose.  Follow renal function closely.  Needs low-sodium diet as well.  3 acute on chronic stage III kidney disease-creatinine increased with diuresis.  Continue to follow.  Nephrology also following.  4 hypertension-blood pressure is mildly elevated.  We  will follow and may need additional antihypertensive.  For questions or updates, please contact [place] Please consult [email or web address] for contact info under        Signed, Kirk Ruths, MD  01/30/2019, 7:48 AM

## 2019-01-30 NOTE — TOC Initial Note (Signed)
Transition of Care [facility]) - Initial/Assessment Note    Patient Details  Name: Jaclyn Day MRN: [id number] Date of Birth: 04/02/1946  [facility]) CM/SW Contact:    Bethena Roys, RN Phone Number: 01/30/2019, 3:59 PM  Clinical Narrative:  Pt presented for hypoxia. PTA from home with boyfriend. Plan to return home once stable. Medicare.gov list provided to patient and she chose [facility]. Referral sent to [facility] and [facility] to begin within 24-48 hours post transition home. Pt will need HH orders and F2F. Benefits check in process for Eliquis. No further needs from CM at this time.                   Expected Discharge Plan: [place] Barriers to Discharge: Continued Medical Work up   Patient Goals and CMS Choice Patient states their goals for this hospitalization and ongoing recovery are:: To feel better and work with physical therapy CMS Medicare.gov Compare Post Acute Care list provided to:: Patient Choice offered to / list presented to : Patient  Expected Discharge Plan and Services Expected Discharge Plan: [place]   Discharge Planning Services: CM Consult Post Acute Care Choice: [place] arrangements for the past 2 months: Single Family Home                 DME Arranged: N/A DME Agency: NA HH Arranged: PT HH Agency: [place]  Prior Living Arrangements/Services Living arrangements for the past 2 months: Single Family Home Lives with:: Significant Other Patient language and need for interpreter reviewed:: Yes Do you feel safe going back to the place where you live?: Yes      Need for Family Participation in Patient Care: Yes (Comment) Care giver support system in place?: Yes (comment) Current home services: (N/A) Criminal Activity/Legal Involvement Pertinent to Current Situation/Hospitalization: No - Comment as needed  Activities of Daily Living       Permission Sought/Granted Permission sought to share information with : Family Supports                Emotional Assessment Appearance:: Appears stated age Attitude/Demeanor/Rapport: Engaged Affect (typically observed): Accepting Orientation: : Oriented to Self, Oriented to Situation, Oriented to Place, Oriented to  Time Alcohol / Substance Use: Not Applicable Psych Involvement: No (comment)  Admission diagnosis:  Renal insufficiency [N28.9] Hypoxia [R09.02] Atrial fibrillation with RVR (HCC) [I48.91] Chronic bilateral pleural effusions [J90] Patient Active Problem List   Diagnosis Date Noted  . Atrial fibrillation with RVR ([place]) 01/25/2019  . CHF (congestive heart failure) ([place]) 01/25/2019  . Hypoxia   . Renal insufficiency   . Renal disorder   . Hypertension    PCP:  Cyndi Bender, PA-C Pharmacy:   [id number] - [place], [address] [place] [address] Phone: [phone] Fax: [phone]     Social Determinants of Health (SDOH) Interventions    Readmission Risk Interventions No flowsheet data found.

## 2019-01-30 NOTE — Progress Notes (Signed)
Patient ID: Jaclyn Day, female   DOB: July 10, 1946, 73 y.o.   MRN: [id number] [facility] Progress Note   Assessment/ Plan:   1. Acute kidney Injury  - Appears likely hemodynamically mediated in the setting of A. fib with RVR/renal hypoperfusion versus cardiorenal with CHF exacerbation.   - Above goal  - Continue with diuresis - may need to decrease lasix to 40 mg daily from BID   2. CKD stage IV  - Chronic kidney disease stage IV (solitary right kidney with history of nephrectomy):  - baseline Cr reported as 2-2.5 and pt follows with Dr. Justin Mend  2.  Acute hypoxemic respiratory failure: Secondary to A. fib with RVR with CHF decompensation.  Clinically appearing to do better with ongoing diuresis.  3.  Atrial fibrillation with rapid ventricular response:  Rate controlled On eliquis  4.  Hypertension: acceptable; continue diuresis  5.  Anemia: Without overt loss, depressed iron stores noted; s/p feraheme.  Stable    Subjective:   Patient reports that she is to have cardioversion tomorrow and indicates that she may be discharged tomorrow.  Review of systems:  Denies shortness of breath or chest pain Denies nausea or vomiting   Objective:   BP (!) 151/98   Pulse 95   Temp (!) 97.5 F (36.4 C) (Oral)   Resp (!) 22   Ht 5\' 5"  (1.651 m)   Wt 85.8 kg   SpO2 95%   BMI 31.48 kg/m   Intake/Output Summary (Last 24 hours) at 01/30/2019 1025 Last data filed at 01/29/2019 2100 Gross per 24 hour  Intake 684 ml  Output 1150 ml  Net -466 ml   Weight change: -0.881 kg  Physical Exam: Gen: elderly female in chair in no acute distress CVS: Pulse irregularly irregular, normal rate, S1 and S2 normal Resp: clear to auscultation and unlabored Abd: Soft, obese, nontender Ext: no pitting edema  Imaging: No results found.  Labs: BMET Recent Labs  Lab 01/25/19 1400 01/26/19 0419 01/27/19 0335 01/28/19 0413 01/29/19 0341 01/30/19 0630  NA 143 143 142 139 137  141  K 4.4 3.9 4.1 4.3 4.4 3.7  CL 114* 112* 109 104 101 98  CO2 20* 20* 25 23 24 30   GLUCOSE 102* 94 110* 103* 97 93  BUN 26* 27* 27* 28* 31* 37*  CREATININE 2.23* 2.49* 2.48* 2.84* 2.74* 2.90*  CALCIUM 9.0 8.8* 8.8* 8.8* 8.7* 8.8*  PHOS  --   --   --   --  4.9* 5.7*   CBC Recent Labs  Lab 01/27/19 0335 01/28/19 0413 01/29/19 0341 01/30/19 0630  WBC 10.9* 11.9* 12.5* 10.5  HGB 9.9* 10.5* 10.8* 11.2*  HCT 31.7* 34.5* 35.9* 35.4*  MCV 98.1 97.7 97.3 97.8  PLT 230 260 258 215    Medications:    . apixaban  5 mg Oral BID  . ARIPiprazole  5 mg Oral BID  . brimonidine  1 drop Both Eyes BID  . divalproex  1,000 mg Oral QHS  . dorzolamide-timolol  1 drop Both Eyes BID  . DULoxetine  60 mg Oral Daily  . furosemide  40 mg Oral BID  . metoprolol tartrate  50 mg Oral BID    Claudia Desanctis 01/30/2019 10:25 am

## 2019-01-30 NOTE — TOC Benefit Eligibility Note (Signed)
Transition of Care [facility]) Benefit Eligibility Note    Patient Details  Name: Jaclyn Day MRN: [id number] Date of Birth: [birth year]   Medication/Dose: [name]  5 MG  BID(APIXABAN : NON-FORMULARY)     Tier: 3 Drug  Prescription Coverage [facility]  Spoke with Person/Company/Phone Number:: [employer] # [phone] )  Co-Pay: 25 % OF TOTAL COST  Prior Approval: No  Deductible: Met(OUT-OF-POCKT: NOT MET)       Memory Argue Phone Number: 01/30/2019, 4:01 PM

## 2019-01-31 ENCOUNTER — Inpatient Hospital Stay (HOSPITAL_COMMUNITY): Payer: 59

## 2019-01-31 ENCOUNTER — Encounter (HOSPITAL_COMMUNITY): Payer: Self-pay | Admitting: Registered Nurse

## 2019-01-31 ENCOUNTER — Inpatient Hospital Stay (HOSPITAL_COMMUNITY): Payer: 59 | Admitting: Registered Nurse

## 2019-01-31 ENCOUNTER — Encounter (HOSPITAL_COMMUNITY)
Admission: EM | Disposition: A | Discharge status: Home Health | Payer: Self-pay | Source: Home / Self Care | Attending: Family Medicine

## 2019-01-31 DIAGNOSIS — I4891 Unspecified atrial fibrillation: Secondary | ICD-10-CM

## 2019-01-31 HISTORY — PX: CARDIOVERSION: SHX1299

## 2019-01-31 HISTORY — PX: TEE WITHOUT CARDIOVERSION: SHX5443

## 2019-01-31 LAB — BASIC METABOLIC PANEL
Anion gap: 11 (ref 5–15)
BUN: 47 mg/dL — ABNORMAL HIGH (ref 8–23)
CO2: 28 mmol/L (ref 22–32)
Calcium: 8.6 mg/dL — ABNORMAL LOW (ref 8.9–10.3)
Chloride: 102 mmol/L (ref 98–111)
Creatinine, Ser: 2.81 mg/dL — ABNORMAL HIGH (ref 0.44–1.00)
GFR calc Af Amer: 19 mL/min — ABNORMAL LOW (ref >60)
GFR calc non Af Amer: 16 mL/min — ABNORMAL LOW (ref >60)
Glucose, Bld: 85 mg/dL (ref 70–99)
Potassium: 3.7 mmol/L (ref 3.5–5.1)
Sodium: 141 mmol/L (ref 135–145)

## 2019-01-31 LAB — TSH: TSH: 1.762 u[IU]/mL (ref 0.350–4.500)

## 2019-01-31 LAB — GLUCOSE, CAPILLARY: Glucose-Capillary: 91 mg/dL (ref 70–99)

## 2019-01-31 SURGERY — ECHOCARDIOGRAM, TRANSESOPHAGEAL
Anesthesia: General

## 2019-01-31 MED ORDER — EPHEDRINE SULFATE-NACL 50-0.9 MG/10ML-% IV SOSY
PREFILLED_SYRINGE | INTRAVENOUS | Status: DC | PRN
  Administered 2019-01-31: 5 mg via INTRAVENOUS

## 2019-01-31 MED ORDER — NITROGLYCERIN 0.4 MG SL SUBL: 0.4000 mg | SUBLINGUAL_TABLET | SUBLINGUAL | Status: DC | PRN

## 2019-01-31 MED ORDER — LIDOCAINE 2% (20 MG/ML) 5 ML SYRINGE
INTRAMUSCULAR | Status: DC | PRN
  Administered 2019-01-31: 60 mg via INTRAVENOUS

## 2019-01-31 MED ORDER — ONDANSETRON HCL 4 MG/2ML IJ SOLN
INTRAMUSCULAR | Status: DC | PRN
  Administered 2019-01-31: 4 mg via INTRAVENOUS

## 2019-01-31 MED ORDER — FUROSEMIDE 40 MG PO TABS: 40.0000 mg | ORAL_TABLET | Freq: Every day | ORAL | 0 refills | Status: DC

## 2019-01-31 MED ORDER — PROPOFOL 10 MG/ML IV BOLUS
INTRAVENOUS | Status: DC | PRN
  Administered 2019-01-31: 100 mg via INTRAVENOUS

## 2019-01-31 MED ORDER — APIXABAN 5 MG PO TABS: 5.0000 mg | ORAL_TABLET | Freq: Two times a day (BID) | ORAL | 0 refills | Status: DC

## 2019-01-31 MED ORDER — FENTANYL CITRATE (PF) 250 MCG/5ML IJ SOLN
INTRAMUSCULAR | Status: DC | PRN
  Administered 2019-01-31: 25 ug via INTRAVENOUS

## 2019-01-31 MED ORDER — SUCCINYLCHOLINE CHLORIDE 200 MG/10ML IV SOSY
PREFILLED_SYRINGE | INTRAVENOUS | Status: DC | PRN
  Administered 2019-01-31: 100 mg via INTRAVENOUS

## 2019-01-31 MED ORDER — DEXAMETHASONE SODIUM PHOSPHATE 10 MG/ML IJ SOLN
INTRAMUSCULAR | Status: DC | PRN
  Administered 2019-01-31: 4 mg via INTRAVENOUS

## 2019-01-31 NOTE — Progress Notes (Signed)
Family Medicine Teaching Service Daily Progress Note Intern Pager: [phone]  Patient name: Jaclyn Day Medical record number: [id number] Date of birth: March 22, 1946 Age: 73 y.o. Gender: female  Primary Care Provider: Cyndi Bender, PA-C Consultants: Nephrology  Code Status: Full Code   Pt Overview and Major Events to Date:  Admitted: 01/25/2019 for CC: Shortness of Breath; Chest Pain; and Atrial Fibrillation  Hospital Day: 7  Assessment and Plan: Jaclyn Day is a 73 y.o. female who presented w/ Shortness of Breath; Chest Pain; and new onset Atrial Fibrillation. Jaclyn Day  has a past medical history of Hypertension and Renal disorder.  #A. fib with RVR, stable Patient had successful cardioversion this morning.  Patient's pulse in 50s since conversion.  Continue metoprolol 50 mg BID and apixaban 5 mg twice daily  Will await cards recommendations status post cardioversion.  #Acute hypoxemic respiratory failure 2/2 Hypervolemic decompensated HF  Nearly 9 L down since admission.  Weight is stable at 86.6.  Switch to p.o. Lasix once daily yesterday.  Patient euvolemic on exam today.  She has not required any supplemental oxygen.  Continue PO lasix 40 daily  O2 only if sats sustained <92%  Walk test with assistive device after TEE  #CKD and unilateral kidney Follows with Dr. Justin Mend. Creatinine stable at 2.9, calcium 8.8 (corrected- 9.8), phosphorus 5.7, GFR 16  Appreciate nephrology recs  Continue PO lasix as above  AM RFP   Hold home valsartan 320 daily   #Hypertension Blood pressure ranging between 112-151/77-98.  Restarted amlodipine yesterday.  This morning, blood pressures are elevated in 140s and 150s.  Appears to be in 654Y systolic over the last couple of hours.  We will continue to monitor and possibly add on home medications if elevated blood pressures.  Patient also uses hydralazine 25mg  BID, Valsartan 320 mg daily, hydroxyzine 10mg  BID at home.  Continue  metoprolol and amlodipine 10 mg  Continue to monitor in w/ diuresis  Can add back home meds as required.  #Osteoarthritis B/L disease. Limits acitivity with assistive device at home and wheelchair when leaving home.    OOB w/ assistance only   PT recs for [facility] with assistance 24 hours.   #Anemia Likely due to CKD. Iron low. Hemoglobin trending upwards.   S/p Feraheme during admission.  Nephrology continue to follow  #Psych/mood disorder   Continue abilify, depakote, cymbalta   #HLD  Holding home atorvastatin 20mg  daily   #FEN/GI:  . Electrolytes: Within normal limits . Nutrition: Heart healthy, fluid restriction 1200 mL, NPO at midnight  Access: Right AC, 4/15, pure wick VTE prophylaxis: Apixaban 5.0 mg twice daily  Disposition: PT Jaclyn Day for home health and 24-hour supervision.  Pending cards recommendations. ================================================================ Subjective:  NAEO.   Objective: BP (!) 134/91 (BP Location: Right Arm)   Pulse 93   Temp 97.7 F (36.5 C) (Oral)   Resp 18   Ht 5\' 5"  (1.651 m)   Wt 86.6 kg   SpO2 98%   BMI 31.77 kg/m  Intake/Output      04/20 0701 - 04/21 0700 04/21 0701 - 04/22 0700   P.O. 840    I.V. (mL/kg) 165 (1.9)    Total Intake(mL/kg) 1005 (11.6)    Urine (mL/kg/hr) 1700 (0.8)    Total Output 1700    Net -695          Physical Exam: General: NAD, non-toxic, well-appearing, sitting up in bed.  Cardiovascular: RRR, normal S1, S2. 2+ RP bilaterally. No BLEE. Respiratory:  CTAB. No IWOB. Breathing comfortably on room air.  Abdomen: + BS. NT, ND, soft to palpation.  Extremities: Warm and well perfused. Moving spontaneously.   Laboratory: I have personally read and reviewed all labs and imaging studies.  CBC: Recent Labs  Lab 01/26/19 0419 01/27/19 0335 01/28/19 0413 01/29/19 0341 01/30/19 0630  WBC 9.4 10.9* 11.9* 12.5* 10.5  HGB 10.2* 9.9* 10.5* 10.8* 11.2*  HCT 32.3* 31.7* 34.5* 35.9* 35.4*  MCV  97.6 98.1 97.7 97.3 97.8  PLT 228 230 260 258 374   Basic Metabolic Panel: Recent Labs  Lab 01/27/19 0335 01/28/19 0413 01/29/19 0341 01/30/19 0630 01/31/19 0447  NA 142 139 137 141 141  K 4.1 4.3 4.4 3.7 3.7  CL 109 104 101 98 102  CO2 25 23 24 30 28   GLUCOSE 110* 103* 97 93 85  BUN 27* 28* 31* 37* 47*  CREATININE 2.48* 2.84* 2.74* 2.90* 2.81*  CALCIUM 8.8* 8.8* 8.7* 8.8* 8.6*  PHOS  --   --  4.9* 5.7*  --    GFR: Estimated Creatinine Clearance: 19.7 mL/min (A) (by C-G formula based on SCr of 2.81 mg/dL (H)). Liver Function Tests: Recent Labs  Lab 01/25/19 1433 01/29/19 0341 01/30/19 0630  AST 14*  --   --   ALT 13  --   --   ALKPHOS 61  --   --   BILITOT 0.5  --   --   PROT 5.3*  --   --   ALBUMIN 2.9* 2.8* 2.7*   Imaging/Diagnostic Tests: No results found.   Wilber Oliphant, MD 01/31/2019, 7:50 AM PGY-1, [place] Intern pager: [phone], text pages welcome

## 2019-01-31 NOTE — Progress Notes (Signed)
Pt's friends awaiting at the parking lot.  States they have been waiting here about 5 hours.  This nurse informed the friends and patient multiple times that she did not have a discharge order and we cannot give any estimate.  Family medicine and cardiology have been informed of the situation.  Idolina Primer, RN

## 2019-01-31 NOTE — Interval H&P Note (Signed)
History and Physical Interval Note:  01/31/2019 11:26 AM  Jaclyn Day  has presented today for surgery, with the diagnosis of Atrial Fibrillation.  The various methods of treatment have been discussed with the patient and family. After consideration of risks, benefits and other options for treatment, the patient has consented to  Procedure(s): TRANSESOPHAGEAL ECHOCARDIOGRAM (TEE) (N/A) CARDIOVERSION (N/A) as a surgical intervention.  The patient's history has been reviewed, patient examined, no change in status, stable for surgery.  I have reviewed the patient's chart and labs.  Questions were answered to the patient's satisfaction.     Kirk Ruths

## 2019-01-31 NOTE — Transfer of Care (Signed)
Immediate Anesthesia Transfer of Care Note  Patient: Jaclyn Day  Procedure(s) Performed: TRANSESOPHAGEAL ECHOCARDIOGRAM (TEE) (N/A ) CARDIOVERSION (N/A )  Patient Location: Endoscopy Unit  Anesthesia Type:General  Level of Consciousness: awake, alert  and oriented  Airway & Oxygen Therapy: Patient Spontanous Breathing and Patient connected to face mask oxygen  Post-op Assessment: Report given to RN and Post -op Vital signs reviewed and stable  Post vital signs: Reviewed and stable  Last Vitals:  Vitals Value Taken Time  BP 124/71 01/31/2019 11:35 AM  Temp    Pulse 48 01/31/2019 11:35 AM  Resp 17 01/31/2019 11:35 AM  SpO2 100 % 01/31/2019 11:35 AM    Last Pain:  Vitals:   01/31/19 1135  TempSrc: Oral  PainSc: 0-No pain      Patients Stated Pain Goal: 0 (29/84/73 0856)  Complications: No apparent anesthesia complications

## 2019-01-31 NOTE — Progress Notes (Signed)
Progress Note  Patient Name: Jaclyn Day Date of Encounter: 01/31/2019  Primary Cardiologist: Ena Dawley, MD   Subjective   No CP or dyspnea  Inpatient Medications    Scheduled Meds: . amLODipine  10 mg Oral Daily  . apixaban  5 mg Oral BID  . ARIPiprazole  5 mg Oral BID  . brimonidine  1 drop Both Eyes BID  . divalproex  1,000 mg Oral QHS  . dorzolamide-timolol  1 drop Both Eyes BID  . DULoxetine  60 mg Oral Daily  . furosemide  40 mg Oral Daily  . metoprolol tartrate  50 mg Oral BID   Continuous Infusions: . sodium chloride 20 mL/hr at 01/30/19 2136   PRN Meds: acetaminophen, ondansetron (ZOFRAN) IV   Vital Signs    Vitals:   01/30/19 1931 01/30/19 2356 01/31/19 0521 01/31/19 0523  BP: (!) 124/94 (!) 112/91 (!) 134/91   Pulse: 95 82 93   Resp: (!) 23 15 18    Temp: 97.8 F (36.6 C)  97.7 F (36.5 C)   TempSrc: Oral  Oral   SpO2: 96% 95% 98%   Weight:    86.6 kg  Height:        Intake/Output Summary (Last 24 hours) at 01/31/2019 0729 Last data filed at 01/31/2019 0600 Gross per 24 hour  Intake 1005.03 ml  Output 1700 ml  Net -694.97 ml   Last 3 Weights 01/31/2019 01/30/2019 01/29/2019  Weight (lbs) 190 lb 14.7 oz 189 lb 2.5 oz 190 lb 4.8 oz  Weight (kg) 86.6 kg 85.8 kg 86.32 kg      Telemetry    Atrial fibrillation; rate controlled - Personally Reviewed  Physical Exam   GEN: No acute distress.  WDWN Neck: supple Cardiac: irregular, no murmur Respiratory: CTA anteriorly GI: Soft, NT/ND MS: No edema Neuro:  Grossly intact Psych: Normal affect   Labs    Chemistry Recent Labs  Lab 01/25/19 1433  01/29/19 0341 01/30/19 0630 01/31/19 0447  NA  --    < > 137 141 141  K  --    < > 4.4 3.7 3.7  CL  --    < > 101 98 102  CO2  --    < > 24 30 28   GLUCOSE  --    < > 97 93 85  BUN  --    < > 31* 37* 47*  CREATININE  --    < > 2.74* 2.90* 2.81*  CALCIUM  --    < > 8.7* 8.8* 8.6*  PROT 5.3*  --   --   --   --   ALBUMIN 2.9*  --   2.8* 2.7*  --   AST 14*  --   --   --   --   ALT 13  --   --   --   --   ALKPHOS 61  --   --   --   --   BILITOT 0.5  --   --   --   --   GFRNONAA  --    < > 17* 16* 16*  GFRAA  --    < > 19* 18* 19*  ANIONGAP  --    < > 12 13 11    < > = values in this interval not displayed.     Hematology Recent Labs  Lab 01/28/19 0413 01/29/19 0341 01/30/19 0630  WBC 11.9* 12.5* 10.5  RBC 3.53* 3.69* 3.62*  HGB 10.5* 10.8*  11.2*  HCT 34.5* 35.9* 35.4*  MCV 97.7 97.3 97.8  MCH 29.7 29.3 30.9  MCHC 30.4 30.1 31.6  RDW 15.0 14.7 14.5  PLT 260 258 215    Cardiac Enzymes Recent Labs  Lab 01/25/19 2207 01/26/19 0419 01/26/19 0953  TROPONINI <0.03 <0.03 <0.03    Recent Labs  Lab 01/25/19 1437  TROPIPOC 0.03     BNP Recent Labs  Lab 01/25/19 1433  BNP 1,574.7*     Patient Profile     73 y.o. female with past medical history of unilateral kidney, hypertension, chronic stage III kidney disease admitted with congestive heart failure and new onset atrial fibrillation.  Echocardiogram showed normal LV function, mild left ventricular hypertrophy and severe left atrial enlargement.  Assessment & Plan    1 persistent atrial fibrillation-patient remains in atrial fibrillation this morning.  We will continue with present dose of metoprolol.  Apixaban was increased to 5 mg twice daily yesterday.  Plan to proceed with TEE guided cardioversion today.  Hopefully this will help with her congestive heart failure symptoms and renal perfusion.    2 acute diastolic congestive heart failure-she appears to be euvolemic this morning.  Decrease Lasix to 40 mg daily.  Continue to follow renal function.    3 acute on chronic stage III kidney disease-creatinine unchanged this morning.  Decrease Lasix to 40 mg daily.  Nephrology following.  4 hypertension-blood pressure is mildly elevated.  We will continue to follow and advance regimen as needed.  For questions or updates, please contact [place] Please consult [email or web address] for contact info under        Signed, Kirk Ruths, MD  01/31/2019, 7:29 AM

## 2019-01-31 NOTE — Discharge Summary (Signed)
[facility] Discharge Summary  Patient name: Jaclyn Day Medical record number: [id number] Date of birth: 1946/07/30 Age: 73 y.o. Gender: female Date of Admission: 01/25/2019  Date of Discharge: 01/31/2019     Admitting Physician: Dickie La, MD  Primary Care Provider: Cyndi Bender, PA-C Consultants: None  Indication for Hospitalization: Hypoxia   Discharge Diagnoses/Problem List:  Principal Problem:   Hypoxia Active Problems:   Atrial fibrillation with RVR ([place])   Renal insufficiency   CHF (congestive heart failure) ([place])  Disposition: Home w/ home health PT  Discharge Condition: Stable  Discharge Exam:  BP (!) 154/85   Pulse (!) 55   Temp 97.8 F (36.6 C) (Oral)   Resp (!) 21   Ht 5' 5.5" (1.664 m)   Wt 86.6 kg   SpO2 94%   BMI 31.29 kg/m   Physical Exam: General: NAD, non-toxic, well-appearing, sitting up in bed.  Cardiovascular: RRR, normal S1, S2. 2+ RP bilaterally. No BLEE. Respiratory: CTAB. No IWOB. Breathing comfortably on room air.  Abdomen: + BS. NT, ND, soft to palpation.  Extremities: Warm and well perfused. Moving spontaneously.   Brief Hospital Course:  Jaclyn Day is a 73 y.o. female with past medical history significant for unilateral kidney with CKD, hypertension, who presented to emergency department with chief complaint of shortness of breath, concerning for acute hypoxemic respiratory failure secondary to A. fib with RVR resulting in heart failure.  Initial work up with significant for BNP of 1574, chest x-ray with bilateral pulmonary edema.  In the ED, patient was started on diltiazem drip and heart rates remained in the 90s down from the 120s on arrival.  Initial management included supplemental oxygen, IV Lasix 40 mg twice daily, echo that showed ejection fraction 60 to 65% with no ventricular abnormalities and enlarged atrium.  Patient was transitioned off of diltiazem drip and transition to metoprolol 50  mg twice daily.  Patient diuresed well with IV Lasix and her breathing continued to improve.  She was net -8 L and down 10 kg from admission weight, and was euvolemic on exam.  Ultimately, she was de-escalated to 40 mg Lasix p.o. once daily.  She also underwent cardioversion with cardiology on 01/31/2019, which she tolerated well.  Her heart rates went from 90s to high 50s and low 60s and maintain sinus rhythm.  Cardiology recommended continuing metoprolol 50mg  BID and Eliquis 5mg  BID.   Patient also worked with PT as she has severe bilateral osteoarthritis in her knees and uses an assistive device at home and will use a wheelchair whenever she leaves the house.  She was instructed to continue avoiding NSAIDs and PT recommended 24-hour assistance upon discharge.  Additionally, in the setting of new anticoagulation on Eliquis, patient will need to be very careful about falls.  During the admission, patient's overall health continued to improve,  and Jaclyn Day was improved and stable by the day of discharge.  Issues for Follow Up:  1. Symptom improvement of fluid overload and BP 2. Ensure outpatient f/u with cardiology 3. Ensure compliance with Eliquis  4. Patient's valsartan and hydralazine were held at discharge, add back as BP allows.  Significant Procedures: DCCV 4/21  Procedure Orders     EKG 12-Lead     ED EKG     EKG 12-Lead     EKG     EKG 12-Lead     EKG 12-Lead     EKG 12-Lead     EKG 12-Lead  EKG 12-Lead     ECHOCARDIOGRAM COMPLETE     ECHO TEE  Significant Labs and Imaging:  Recent Labs  Lab 01/28/19 0413 01/29/19 0341 01/30/19 0630  WBC 11.9* 12.5* 10.5  HGB 10.5* 10.8* 11.2*  HCT 34.5* 35.9* 35.4*  PLT 260 258 215   Recent Labs  Lab 01/27/19 0335 01/28/19 0413 01/29/19 0341 01/30/19 0630 01/31/19 0447  NA 142 139 137 141 141  K 4.1 4.3 4.4 3.7 3.7  CL 109 104 101 98 102  CO2 25 23 24 30 28   GLUCOSE 110* 103* 97 93 85  BUN 27* 28* 31* 37* 47*  CREATININE  2.48* 2.84* 2.74* 2.90* 2.81*  CALCIUM 8.8* 8.8* 8.7* 8.8* 8.6*  PHOS  --   --  4.9* 5.7*  --   ALBUMIN  --   --  2.8* 2.7*  --     Dg Chest 2 View  Result Date: 01/25/2019 CLINICAL DATA:  73 year old female with chest pain EXAM: CHEST - 2 VIEW COMPARISON:  05/07/2009, 09/24/2008. FINDINGS: Cardiomediastinal silhouette unchanged in size and contour. New opacity at the right lung base with obscuration of the right hemidiaphragm and the right heart border. Opacity at the left lung base with blunting of left costophrenic angle. Lateral view demonstrates meniscus in the costophrenic sulcus. No pneumothorax.  Interlobular septal thickening. IMPRESSION: Bilateral pleural effusions with associated atelectasis/consolidation. Electronically Signed   By: Corrie Mckusick D.O.   On: 01/25/2019 15:33    Results/Tests Pending at Time of Discharge:  . none  Discharge Medications:  Allergies as of 01/31/2019   No Known Allergies     Medication List    STOP taking these medications   hydrALAZINE 25 MG tablet Commonly known as:  APRESOLINE   hydrOXYzine 10 MG tablet Commonly known as:  ATARAX/VISTARIL   ondansetron 4 MG tablet Commonly known as:  ZOFRAN   traMADol 50 MG tablet Commonly known as:  ULTRAM     TAKE these medications   amLODipine 10 MG tablet Commonly known as:  NORVASC Take 10 mg by mouth daily.   apixaban 5 MG Tabs tablet Commonly known as:  ELIQUIS Take 1 tablet (5 mg total) by mouth 2 (two) times daily.   ARIPiprazole 10 MG tablet Commonly known as:  ABILIFY Take 5 mg by mouth 2 (two) times daily.   atorvastatin 20 MG tablet Commonly known as:  LIPITOR Take 20 mg by mouth daily.   brimonidine 0.15 % ophthalmic solution Commonly known as:  ALPHAGAN Place 1 drop into both eyes 2 (two) times daily.   divalproex 500 MG DR tablet Commonly known as:  DEPAKOTE Take 1,000 mg by mouth at bedtime.   dorzolamide-timolol 22.3-6.8 MG/ML ophthalmic solution Commonly known  as:  COSOPT Place 1 drop into both eyes 2 (two) times daily.   DULoxetine 60 MG capsule Commonly known as:  CYMBALTA Take 60 mg by mouth daily.   furosemide 40 MG tablet Commonly known as:  LASIX Take 1 tablet (40 mg total) by mouth daily.   metoprolol succinate 50 MG 24 hr tablet Commonly known as:  TOPROL-XL Take 100 mg by mouth daily.       Discharge Instructions: Please refer to Patient Instructions section of EMR for full details.  Patient was counseled important signs and symptoms that should prompt return to medical care, changes in medications, dietary instructions, activity restrictions, and follow up appointments.   Follow-Up Appointments: Future Appointments  Date Time Provider [place]  02/09/2019 10:30 AM Dunn,  Nedra Hai, PA-C [facility]     Cleophas Dunker, DO 02/01/2019, 4:24 PM PGY-1, [place]

## 2019-01-31 NOTE — Anesthesia Procedure Notes (Addendum)
Procedure Name: Intubation Date/Time: 01/31/2019 11:01 AM Performed by: Jearld Pies, CRNA Pre-anesthesia Checklist: Patient identified, Emergency Drugs available, Suction available and Patient being monitored Patient Re-evaluated:Patient Re-evaluated prior to induction Oxygen Delivery Method: Circle System Utilized Preoxygenation: Pre-oxygenation with 100% oxygen Induction Type: IV induction and Rapid sequence Laryngoscope Size: Mac and 3 Grade View: Grade I Tube type: Oral Tube size: 7.0 mm Number of attempts: 1 Airway Equipment and Method: Stylet and Oral airway Placement Confirmation: ETT inserted through vocal cords under direct vision,  positive ETCO2 and breath sounds checked- equal and bilateral Secured at: 21 cm Tube secured with: Tape Dental Injury: Teeth and Oropharynx as per pre-operative assessment

## 2019-01-31 NOTE — Progress Notes (Signed)
    Transesophageal Echocardiogram Note  Jaclyn Day [id number] April 27, 1946  Procedure: Transesophageal Echocardiogram Indications: atrial fibrillation  Procedure Details Consent: Obtained Time Out: Verified patient identification, verified procedure, site/side was marked, verified correct patient position, special equipment/implants available, Radiology Safety Procedures followed,  medications/allergies/relevent history reviewed, required imaging and test results available.  Performed  Medications:  Pt sedated by anesthesia with diprovan 100 mg and fentanyl 25 micrograms IV.  Normal LV function; no LAA thrombus; mild MR and TR.  Pt subsequently underwent DCCV to sinus bradycardia with 120J.  Continue apixaban.   Complications: No apparent complications Patient did tolerate procedure well.  Kirk Ruths, MD

## 2019-01-31 NOTE — Anesthesia Preprocedure Evaluation (Addendum)
Anesthesia Evaluation  Patient identified by MRN, date of birth, ID band Patient awake    Reviewed: Allergy & Precautions, H&P , NPO status , Patient's Chart, lab work & pertinent test results, reviewed documented beta blocker date and time   Airway Mallampati: III  TM Distance: >3 FB Neck ROM: Full    Dental no notable dental hx. (+) Edentulous Upper, Dental Advisory Given   Pulmonary neg pulmonary ROS,    Pulmonary exam normal breath sounds clear to auscultation       Cardiovascular hypertension, Pt. on medications and Pt. on home beta blockers +CHF  + dysrhythmias Atrial Fibrillation  Rhythm:Irregular Rate:Normal     Neuro/Psych negative neurological ROS  negative psych ROS   GI/Hepatic negative GI ROS, Neg liver ROS,   Endo/Other  negative endocrine ROS  Renal/GU Renal InsufficiencyRenal disease  negative genitourinary   Musculoskeletal   Abdominal   Peds  Hematology negative hematology ROS (+)   Anesthesia Other Findings   Reproductive/Obstetrics negative OB ROS                           Anesthesia Physical Anesthesia Plan  ASA: III  Anesthesia Plan: General   Post-op Pain Management:    Induction: Intravenous  PONV Risk Score and Plan: 3 and Ondansetron and Treatment may vary due to age or medical condition  Airway Management Planned: Oral ETT  Additional Equipment:   Intra-op Plan:   Post-operative Plan: Extubation in OR  Informed Consent: I have reviewed the patients History and Physical, chart, labs and discussed the procedure including the risks, benefits and alternatives for the proposed anesthesia with the patient or authorized representative who has indicated his/her understanding and acceptance.     Dental advisory given  Plan Discussed with: CRNA  Anesthesia Plan Comments:         Anesthesia Quick Evaluation

## 2019-01-31 NOTE — Anesthesia Postprocedure Evaluation (Signed)
Anesthesia Post Note  Patient: Jaclyn Day  Procedure(s) Performed: TRANSESOPHAGEAL ECHOCARDIOGRAM (TEE) (N/A ) CARDIOVERSION (N/A )     Patient location during evaluation: Endoscopy Anesthesia Type: General Level of consciousness: awake and alert Pain management: pain level controlled Vital Signs Assessment: post-procedure vital signs reviewed and stable Respiratory status: spontaneous breathing, nonlabored ventilation and respiratory function stable Cardiovascular status: blood pressure returned to baseline and stable Postop Assessment: no apparent nausea or vomiting Anesthetic complications: no    Last Vitals:  Vitals:   01/31/19 1146 01/31/19 1155  BP: 123/67 (!) 154/85  Pulse: (!) 55 (!) 55  Resp: 16 (!) 21  Temp:    SpO2: 95% 94%    Last Pain:  Vitals:   01/31/19 1155  TempSrc:   PainSc: 0-No pain                 Edras Wilford,W. EDMOND

## 2019-01-31 NOTE — Progress Notes (Signed)
  Echocardiogram Echocardiogram Transesophageal has been performed.  Jaclyn Day 01/31/2019, 11:26 AM

## 2019-01-31 NOTE — H&P (View-Only) (Signed)
Progress Note  Patient Name: Jaclyn Day Date of Encounter: 01/31/2019  Primary Cardiologist: Ena Dawley, MD   Subjective   No CP or dyspnea  Inpatient Medications    Scheduled Meds: . amLODipine  10 mg Oral Daily  . apixaban  5 mg Oral BID  . ARIPiprazole  5 mg Oral BID  . brimonidine  1 drop Both Eyes BID  . divalproex  1,000 mg Oral QHS  . dorzolamide-timolol  1 drop Both Eyes BID  . DULoxetine  60 mg Oral Daily  . furosemide  40 mg Oral Daily  . metoprolol tartrate  50 mg Oral BID   Continuous Infusions: . sodium chloride 20 mL/hr at 01/30/19 2136   PRN Meds: acetaminophen, ondansetron (ZOFRAN) IV   Vital Signs    Vitals:   01/30/19 1931 01/30/19 2356 01/31/19 0521 01/31/19 0523  BP: (!) 124/94 (!) 112/91 (!) 134/91   Pulse: 95 82 93   Resp: (!) 23 15 18    Temp: 97.8 F (36.6 C)  97.7 F (36.5 C)   TempSrc: Oral  Oral   SpO2: 96% 95% 98%   Weight:    86.6 kg  Height:        Intake/Output Summary (Last 24 hours) at 01/31/2019 0729 Last data filed at 01/31/2019 0600 Gross per 24 hour  Intake 1005.03 ml  Output 1700 ml  Net -694.97 ml   Last 3 Weights 01/31/2019 01/30/2019 01/29/2019  Weight (lbs) 190 lb 14.7 oz 189 lb 2.5 oz 190 lb 4.8 oz  Weight (kg) 86.6 kg 85.8 kg 86.32 kg      Telemetry    Atrial fibrillation; rate controlled - Personally Reviewed  Physical Exam   GEN: No acute distress.  WDWN Neck: supple Cardiac: irregular, no murmur Respiratory: CTA anteriorly GI: Soft, NT/ND MS: No edema Neuro:  Grossly intact Psych: Normal affect   Labs    Chemistry Recent Labs  Lab 01/25/19 1433  01/29/19 0341 01/30/19 0630 01/31/19 0447  NA  --    < > 137 141 141  K  --    < > 4.4 3.7 3.7  CL  --    < > 101 98 102  CO2  --    < > 24 30 28   GLUCOSE  --    < > 97 93 85  BUN  --    < > 31* 37* 47*  CREATININE  --    < > 2.74* 2.90* 2.81*  CALCIUM  --    < > 8.7* 8.8* 8.6*  PROT 5.3*  --   --   --   --   ALBUMIN 2.9*  --   2.8* 2.7*  --   AST 14*  --   --   --   --   ALT 13  --   --   --   --   ALKPHOS 61  --   --   --   --   BILITOT 0.5  --   --   --   --   GFRNONAA  --    < > 17* 16* 16*  GFRAA  --    < > 19* 18* 19*  ANIONGAP  --    < > 12 13 11    < > = values in this interval not displayed.     Hematology Recent Labs  Lab 01/28/19 0413 01/29/19 0341 01/30/19 0630  WBC 11.9* 12.5* 10.5  RBC 3.53* 3.69* 3.62*  HGB 10.5* 10.8*  11.2*  HCT 34.5* 35.9* 35.4*  MCV 97.7 97.3 97.8  MCH 29.7 29.3 30.9  MCHC 30.4 30.1 31.6  RDW 15.0 14.7 14.5  PLT 260 258 215    Cardiac Enzymes Recent Labs  Lab 01/25/19 2207 01/26/19 0419 01/26/19 0953  TROPONINI <0.03 <0.03 <0.03    Recent Labs  Lab 01/25/19 1437  TROPIPOC 0.03     BNP Recent Labs  Lab 01/25/19 1433  BNP 1,574.7*     Patient Profile     73 y.o. female with past medical history of unilateral kidney, hypertension, chronic stage III kidney disease admitted with congestive heart failure and new onset atrial fibrillation.  Echocardiogram showed normal LV function, mild left ventricular hypertrophy and severe left atrial enlargement.  Assessment & Plan    1 persistent atrial fibrillation-patient remains in atrial fibrillation this morning.  We will continue with present dose of metoprolol.  Apixaban was increased to 5 mg twice daily yesterday.  Plan to proceed with TEE guided cardioversion today.  Hopefully this will help with her congestive heart failure symptoms and renal perfusion.    2 acute diastolic congestive heart failure-she appears to be euvolemic this morning.  Decrease Lasix to 40 mg daily.  Continue to follow renal function.    3 acute on chronic stage III kidney disease-creatinine unchanged this morning.  Decrease Lasix to 40 mg daily.  Nephrology following.  4 hypertension-blood pressure is mildly elevated.  We will continue to follow and advance regimen as needed.  For questions or updates, please contact [place] Please consult [email or web address] for contact info under        Signed, Kirk Ruths, MD  01/31/2019, 7:29 AM

## 2019-01-31 NOTE — Progress Notes (Signed)
   Spoke with Dr. Stanford Breed - OK for discharge from a cardiac standpoint following TEE/DCCV. See his note earlier today for further recommendations. Our office will call patient to set up telehealth follow-up visit.  Darreld Mclean, PA-C 01/31/2019 4:31 PM

## 2019-01-31 NOTE — Progress Notes (Signed)
Physical Therapy Treatment Patient Details Name: Jaclyn Day MRN: [id number] DOB: [birth year] Today's Date: 01/31/2019    History of Present Illness 73 y.o. female admitted for hypoxia with new onset AFib and acute on chronic CHF. PMHx: unilateral kidney (congenital), bil knee OA and HTN     PT Comments    PT pleasant awaiting test this AM but very willing to get up and moving. Pt with continued ability for 2 short gait trials and increased HEP this session. Pt on RA throughout session with SpO2 89-93% and up to 94% at rest end of session. HR 89 at rest and up to 135 Afib briefly during gait with quick recovery. Will continue to follow.    Follow Up Recommendations  Home health PT;Supervision/Assistance - 24 hour     Equipment Recommendations  None recommended by PT    Recommendations for Other Services       Precautions / Restrictions Precautions Precautions: Fall Precaution Comments: watch sats and HR    Mobility  Bed Mobility Overal bed mobility: Modified Independent Bed Mobility: Supine to Sit     Supine to sit: HOB elevated     General bed mobility comments: with use of rail and HOB 25 degrees pt able to pivot to EOB without physical assist and return to bed with use of rail. min assist to slide to HOE  Transfers Overall transfer level: Needs assistance   Transfers: Sit to/from Stand Sit to Stand: Min guard         General transfer comment: cues for hand placement, safety. pt stood from bed and chair with armrests  Ambulation/Gait Ambulation/Gait assistance: Min guard Gait Distance (Feet): 11 Feet Assistive device: Rolling walker (2 wheeled) Gait Pattern/deviations: Step-through pattern;Trunk flexed;Decreased stride length   Gait velocity interpretation: <1.8 ft/sec, indicate of risk for recurrent falls General Gait Details: cues for posture and position in RW with pt maintaining 89-93% on RA. PT walked 11'x 2 with seated rest between. HR up to 135  with limited gait   Stairs             Wheelchair Mobility    Modified Rankin (Stroke Patients Only)       Balance Overall balance assessment: Needs assistance;History of Falls   Sitting balance-Leahy Scale: Fair       Standing balance-Leahy Scale: Poor Standing balance comment: bil UE support in standing on RW                            Cognition Arousal/Alertness: Awake/alert Behavior During Therapy: Flat affect Overall Cognitive Status: No family/caregiver present to determine baseline cognitive functioning Area of Impairment: Memory;Problem solving;Safety/judgement                     Memory: Decreased short-term memory   Safety/Judgement: Decreased awareness of safety;Decreased awareness of deficits   Problem Solving: Slow processing;Requires verbal cues        Exercises General Exercises - Lower Extremity Ankle Circles/Pumps: AROM;15 reps;Supine;Both Short Arc Quad: AROM;15 reps;Seated;Both Hip ABduction/ADduction: AROM;15 reps;Supine;Both Hip Flexion/Marching: AROM;Seated;Both;15 reps    General Comments        Pertinent Vitals/Pain Pain Assessment: No/denies pain    Home Living                      Prior Function            PT Goals (current goals can now be found in the  care plan section) Progress towards PT goals: Progressing toward goals    Frequency           PT Plan Current plan remains appropriate    Co-evaluation              AM-PAC PT "6 Clicks" Mobility   Outcome Measure  Help needed turning from your back to your side while in a flat bed without using bedrails?: A Little Help needed moving from lying on your back to sitting on the side of a flat bed without using bedrails?: A Little Help needed moving to and from a bed to a chair (including a wheelchair)?: A Little Help needed standing up from a chair using your arms (e.g., wheelchair or bedside chair)?: A Little Help needed to  walk in hospital room?: A Little Help needed climbing 3-5 steps with a railing? : Total 6 Click Score: 16    End of Session Equipment Utilized During Treatment: Gait belt Activity Tolerance: Patient tolerated treatment well Patient left: with call bell/phone within reach;in bed;with bed alarm set Nurse Communication: Mobility status PT Visit Diagnosis: Other abnormalities of gait and mobility (R26.89);Muscle weakness (generalized) (M62.81);Unsteadiness on feet (R26.81);History of falling (Z91.81)     Time: 0254-2706 PT Time Calculation (min) (ACUTE ONLY): 19 min  Charges:  $Therapeutic Activity: 8-22 mins                     Zyere Jiminez Pam Drown, PT Acute Rehabilitation Services Pager: [phone] Office: [place] 01/31/2019, 8:55 AM

## 2019-02-01 ENCOUNTER — Encounter (INDEPENDENT_AMBULATORY_CARE_PROVIDER_SITE_OTHER): Payer: 59 | Admitting: Ophthalmology

## 2019-02-02 ENCOUNTER — Encounter (HOSPITAL_COMMUNITY): Payer: Self-pay | Admitting: Cardiology

## 2019-02-08 ENCOUNTER — Telehealth: Payer: Self-pay | Admitting: Physician Assistant

## 2019-02-08 NOTE — Telephone Encounter (Signed)
LEFT 2ND MESSAGE FOR PATIENT TO CALL BACK ABOUT APPT - NEED CONSENT AND TO FIND OUT IF PT HAS SMARTPHONE OR NEEDS TELEPHONE VISIT

## 2019-02-08 NOTE — Telephone Encounter (Signed)
New Message   Patient doesn't mind doing virtual visit please call to help him setup for visit.

## 2019-02-08 NOTE — Telephone Encounter (Signed)
lmom to confirm appt and get consent for virtual visit - see if patient wants to sign up for Mychart

## 2019-02-09 ENCOUNTER — Telehealth (INDEPENDENT_AMBULATORY_CARE_PROVIDER_SITE_OTHER): Payer: 59 | Admitting: Physician Assistant

## 2019-02-09 ENCOUNTER — Encounter: Payer: Self-pay | Admitting: Physician Assistant

## 2019-02-09 ENCOUNTER — Telehealth: Payer: Self-pay | Admitting: Physician Assistant

## 2019-02-09 ENCOUNTER — Other Ambulatory Visit: Payer: Self-pay

## 2019-02-09 VITALS — BP 123/75 | HR 80 | Ht 65.5 in | Wt 193.0 lb

## 2019-02-09 DIAGNOSIS — I5032 Chronic diastolic (congestive) heart failure: Secondary | ICD-10-CM

## 2019-02-09 DIAGNOSIS — I10 Essential (primary) hypertension: Secondary | ICD-10-CM

## 2019-02-09 DIAGNOSIS — I4819 Other persistent atrial fibrillation: Secondary | ICD-10-CM | POA: Diagnosis not present

## 2019-02-09 DIAGNOSIS — N184 Chronic kidney disease, stage 4 (severe): Secondary | ICD-10-CM

## 2019-02-09 NOTE — Telephone Encounter (Signed)
Call placed to POA, [name] re: message below from [employer], [place].  Left a message for him to call back.

## 2019-02-09 NOTE — Telephone Encounter (Signed)
I agree that in a lack of symptoms I would continue rate control for now.

## 2019-02-09 NOTE — Patient Instructions (Signed)
Medication Instructions:  Your physician recommends that you continue on your current medications as directed. Please refer to the Current Medication list given to you today.  If you need a refill on your cardiac medications before your next appointment, please call your pharmacy.   Lab work: None ordered  If you have labs (blood work) drawn today and your tests are completely normal, you will receive your results only by: Marland Kitchen MyChart Message (if you have MyChart) OR . A paper copy in the mail If you have any lab test that is abnormal or we need to change your treatment, we will call you to review the results.  Testing/Procedures: None ordered  Follow-Up: At [facility], you and your health needs are our priority.  As part of our continuing mission to provide you with exceptional heart care, we have created designated Provider Care Teams.  These Care Teams include your primary Cardiologist (physician) and Advanced Practice Providers (APPs -  Physician Assistants and Nurse Practitioners) who all work together to provide you with the care you need, when you need it. You will need a follow up appointment in 4 months.  Please call our office 2 months in advance to schedule this appointment.  You may see Ena Dawley, MD or one of the following Advanced Practice Providers on your designated Care Team:   [place], PA-C Melina Copa, PA-C . Ermalinda Barrios, PA-C  Any Other Special Instructions Will Be Listed Below (If Applicable).

## 2019-02-09 NOTE — Progress Notes (Signed)
Virtual Visit via Telephone Note   This visit type was conducted due to national recommendations for restrictions regarding the COVID-19 Pandemic (e.g. social distancing) in an effort to limit this patient's exposure and mitigate transmission in our community.  Due to her co-morbid illnesses, this patient is at least at moderate risk for complications without adequate follow up.  This format is felt to be most appropriate for this patient at this time.  The patient did not have access to video technology/had technical difficulties with video requiring transitioning to audio format only (telephone).  All issues noted in this document were discussed and addressed.  No physical exam could be performed with this format.  Please refer to the patient's chart for her  consent to telehealth for [facility].   Evaluation Performed:  Follow-up visit  Date:  02/09/2019   ID:  Jaclyn Day, DOB 1946/02/09, MRN [id number]  Patient Location: Home Provider Location: Home  PCP:  Cyndi Bender, PA-C  Cardiologist:  Ena Dawley, MD    Electrophysiologist:  None   Chief Complaint: f/u atrial fib, CHF  History of Present Illness:    AARUSHI HEMRIC is a 73 y.o. female with congenital unilateral kidney with CKD stage IV (baseline reported 2.2-2.8) and hypertension, chronic stage III kidney disease who presents for telehealth visit. She was recently admitted earlier this month with 1 week history of SOB, fatigue, and chest tightness without any covid contacts. She presented to her nephrologist's office and was found to be hypoxic in the 80s so was sent to the ED. Her initial workup showed bilateral effusions on CXR with associated atelectasis and consolidation, elevated BNP and new onset atrial fib with HR 132. 2D Echo showed EF 60-65%, normal RV, severely dilated LA. She was diuresed with good UOP and rate controlled. PT did note her O2 sat was still 89-93% prior to DCCV. She underwent TEE/DCCV  01/31/19 with conversion to sinus bradycardia.  Labs otherwise showed normal TSH, K 3.7, Cr 2.81, Hgb 11.2 (stable during admission), LDL 67, albumin 2.9, troponins negative. Valsartan was reported to be held. POx reported to be 94% prior to DC. She had repeat labs through primary care 4/28 with Cr down to 2.4 and Hgb holding at 11 (viewed by [facility]).  She presents back today for telehealth visit doing great and feeling much better. Her boyfriend and POA, Ruthe Mannan, helped assist with the visit with her permission. He mentions her memory can be tricky sometimes and he helps aid in her care. Her leg swelling has resolved. Breathing is back to baseline without SOB. No palpitations or racing heart. He does not have a Kardia or pulse ox. He was unable to reliably manually check her pulse during the visit. He mentioned that the patient is now being followed by [facility] home health who came out yesterday and did a physical assessment and said everything looked great. Her weight is up 3lb from hospital dc but he states her appetite is so much better so likely related to that. The patient does not have symptoms concerning for COVID-19 infection (fever, chills, cough, or new shortness of breath).    Past Medical History:  Diagnosis Date   Anemia    Chronic diastolic CHF (congestive heart failure) (HCC)    CKD (chronic kidney disease), stage IV (HCC)    Hypertension    Persistent atrial fibrillation    a. dx 01/2019 s/p TEE DCCV.   Renal disorder    Unilateral congenital absence of kidney  Past Surgical History:  Procedure Laterality Date   CARDIOVERSION N/A 01/31/2019   Procedure: CARDIOVERSION;  Surgeon: Lelon Perla, MD;  Location: [facility] ENDOSCOPY;  Service: Cardiovascular;  Laterality: N/A;   TEE WITHOUT CARDIOVERSION N/A 01/31/2019   Procedure: TRANSESOPHAGEAL ECHOCARDIOGRAM (TEE);  Surgeon: Lelon Perla, MD;  Location: [facility] ENDOSCOPY;  Service: Cardiovascular;  Laterality: N/A;      Current Meds  Medication Sig   amLODipine (NORVASC) 10 MG tablet Take 10 mg by mouth daily.   apixaban (ELIQUIS) 5 MG TABS tablet Take 1 tablet (5 mg total) by mouth 2 (two) times daily.   ARIPiprazole (ABILIFY) 10 MG tablet Take 5 mg by mouth 2 (two) times daily.   atorvastatin (LIPITOR) 20 MG tablet Take 20 mg by mouth daily.   brimonidine (ALPHAGAN) 0.15 % ophthalmic solution Place 1 drop into the left eye 2 (two) times daily.    divalproex (DEPAKOTE) 500 MG DR tablet Take 1,000 mg by mouth at bedtime.   dorzolamide-timolol (COSOPT) 22.3-6.8 MG/ML ophthalmic solution Place 1 drop into both eyes 2 (two) times daily.   DULoxetine (CYMBALTA) 60 MG capsule Take 60 mg by mouth daily.   furosemide (LASIX) 40 MG tablet Take 1 tablet (40 mg total) by mouth daily.   metoprolol succinate (TOPROL-XL) 50 MG 24 hr tablet Take 100 mg by mouth daily.   [DISCONTINUED] atorvastatin (LIPITOR) 20 MG tablet Take 20 mg by mouth daily.     Allergies:   Patient has no known allergies.   Social History   Tobacco Use   Smoking status: Never Smoker   Smokeless tobacco: Never Used  Substance Use Topics   Alcohol use: Not Currently   Drug use: Not Currently     Family Hx: The patient's family history includes Atrial fibrillation in her brother and father.  ROS:   Please see the history of present illness.    All other systems reviewed and are negative.   Prior CV studies:   Outlined above  Labs/Other Tests and Data Reviewed:    EKG:  Personally reviewed EKG from 01/31/19 sinus bradycardia with one PAC, nonspecific STT changes, QTc 450ms  Recent Labs: 01/25/2019: ALT 13; B Natriuretic Peptide 1,574.7 01/30/2019: Hemoglobin 11.2; Platelets 215 01/31/2019: BUN 47; Creatinine, Ser 2.81; Potassium 3.7; Sodium 141; TSH 1.762   Recent Lipid Panel Lab Results  Component Value Date/Time   CHOL 157 01/26/2019 04:19 AM   TRIG 128 01/26/2019 04:19 AM   HDL 64 01/26/2019 04:19 AM    CHOLHDL 2.5 01/26/2019 04:19 AM   LDLCALC 67 01/26/2019 04:19 AM    Wt Readings from Last 3 Encounters:  02/09/19 193 lb (87.5 kg)  01/31/19 190 lb 14.7 oz (86.6 kg)     Objective:    Vital Signs:  BP 123/75    Pulse 80    Ht 5' 5.5" (1.664 m)    Wt 193 lb (87.5 kg)    BMI 31.63 kg/m    General - elderly F in no acute distress Pulm - No labored breathing, no coughing during visit, no audible wheezing, speaking in full sentences Neuro - A+Ox3, no slurred speech, answers questions appropriately Psych - Pleasant affect   ASSESSMENT & PLAN:    1. Persistent atrial fibrillation - HR is controlled by pulse report. Caregiver and patient do not have another way to report her pulse such as Kardia or manual assessment. She is getting assessed regularly now by home health. She reportedly had a visit yesterday where all parameters were  deemed great. I've asked our nurse to reach out to the patient to get the phone numbers he has for that program to see if they have any information on whether HR was regular or irregular. If not, our nurse will request this information be included going forward and communicated to our office, along with pulse ox as well. She had severe LAE by echo so has increased risk for return of atrial fib but by symptoms is feeling markedly better. Also discussed monitoring for any bleeding on Eliquis. Need to periodically follow dose - when she turns 81 or if she loses weight below 60kg, will need reduction in dose. For now, this remains appropriate. 2. Chronic diastolic CHF - by phone report, appearing euvolemic. She is also being followed by home health. Continue present regimen. Reviewed 2g sodium restriction, 2L fluid restriction, daily weights with patient/caregiver. 3. CKD stage IV - followed by Dr. Justin Mend at [facility]. She is having routine bloodwork followed by their office and has follow-up with them later in May. 4. Essential HTN - BP normal on present regimen. Would  not resume valsartan or hydralazine at this time.  COVID-19 Education: The signs and symptoms of COVID-19 were discussed with the patient and how to seek care for testing (follow up with PCP or arrange E-visit).  The importance of social distancing was discussed today.  Time:   Today, I have spent 20 minutes with the patient with telehealth technology discussing the above problems.     Medication Adjustments/Labs and Tests Ordered: Current medicines are reviewed at length with the patient today.  Concerns regarding medicines are outlined above.   Disposition:  Follow up 4 months with Dr. Meda Coffee (or myself if no availability)  Signed, Charlie Pitter, PA-C  02/09/2019 11:05 AM    [place]

## 2019-02-09 NOTE — Telephone Encounter (Signed)
YOUR CARDIOLOGY TEAM HAS ARRANGED FOR AN E-VISIT FOR YOUR APPOINTMENT - PLEASE REVIEW IMPORTANT INFORMATION BELOW SEVERAL DAYS PRIOR TO YOUR APPOINTMENT  Due to the recent COVID-19 pandemic, we are transitioning in-person office visits to tele-medicine visits in an effort to decrease unnecessary exposure to our patients, their families, and staff. These visits are billed to your insurance just like a normal visit is. We also encourage you to sign up for MyChart if you have not already done so. You will need a smartphone if possible. For patients that do not have this, we can still complete the visit using a regular telephone but do prefer a smartphone to enable video when possible. You may have a family member that lives with you that can help. If possible, we also ask that you have a blood pressure cuff and scale at home to measure your blood pressure, heart rate and weight prior to your scheduled appointment. Patients with clinical needs that need an in-person evaluation and testing will still be able to come to the office if absolutely necessary. If you have any questions, feel free to call our office.     YOUR PROVIDER WILL BE USING THE FOLLOWING PLATFORM TO COMPLETE YOUR VISIT: TELEPHONE  Pt don't have access to a smart phone or computer.   IF USING MYCHART - How to Download the MyChart App to Your SmartPhone   - If Apple, go to [employer] and type in MyChart in the search bar and download the app. If Android, ask patient to go to [employer] and type in [place] in the search bar and download the app. The app is free but as with any other app downloads, your phone may require you to verify saved payment information or Apple/Android password.  - You will need to then log into the app with your MyChart username and password, and select [place] as your healthcare provider to link the account.  - When it is time for your visit, go to the MyChart app, find appointments, and click Begin Video  Visit. Be sure to Select Allow for your device to access the Microphone and Camera for your visit. You will then be connected, and your provider will be with you shortly.  **If you have any issues connecting or need assistance, please contact MyChart service desk [phone])**  **If using a computer, in order to ensure the best quality for your visit, you will need to use either of the following Internet Browsers: Insurance underwriter or Microsoft Edge**   IF USING DOXIMITY or DOXY.ME - The staff will give you instructions on receiving your link to join the meeting the day of your visit.      2-3 DAYS BEFORE YOUR APPOINTMENT  You will receive a telephone call from one of our [place] team members - your caller ID may say "Unknown caller." If this is a video visit, we will walk you through how to get the video launched on your phone. We will remind you check your blood pressure, heart rate and weight prior to your scheduled appointment. If you have an Apple Watch or Kardia, please upload any pertinent ECG strips the day before or morning of your appointment to [place]. Our staff will also make sure you have reviewed the consent and agree to move forward with your scheduled tele-health visit.     THE DAY OF YOUR APPOINTMENT  Approximately 15 minutes prior to your scheduled appointment, you will receive a telephone call from one of  [facility] team - your caller ID may say "Unknown caller."  Our staff will confirm medications, vital signs for the day and any symptoms you may be experiencing. Please have this information available prior to the time of visit start. It may also be helpful for you to have a pad of paper and pen handy for any instructions given during your visit. They will also walk you through joining the smartphone meeting if this is a video visit.    CONSENT FOR TELE-HEALTH VISIT - PLEASE REVIEW  I hereby voluntarily request, consent and authorize [place] and its  employed or contracted physicians, physician assistants, nurse practitioners or other licensed health care professionals (the Practitioner), to provide me with telemedicine health care services (the Services") as deemed necessary by the treating Practitioner. I acknowledge and consent to receive the Services by the Practitioner via telemedicine. I understand that the telemedicine visit will involve communicating with the Practitioner through live audiovisual communication technology and the disclosure of certain medical information by electronic transmission. I acknowledge that I have been given the opportunity to request an in-person assessment or other available alternative prior to the telemedicine visit and am voluntarily participating in the telemedicine visit.  I understand that I have the right to withhold or withdraw my consent to the use of telemedicine in the course of my care at any time, without affecting my right to future care or treatment, and that the Practitioner or I may terminate the telemedicine visit at any time. I understand that I have the right to inspect all information obtained and/or recorded in the course of the telemedicine visit and may receive copies of available information for a reasonable fee.  I understand that some of the potential risks of receiving the Services via telemedicine include:   Delay or interruption in medical evaluation due to technological equipment failure or disruption;  Information transmitted may not be sufficient (e.g. poor resolution of images) to allow for appropriate medical decision making by the Practitioner; and/or   In rare instances, security protocols could fail, causing a breach of personal health information.  Furthermore, I acknowledge that it is my responsibility to provide information about my medical history, conditions and care that is complete and accurate to the best of my ability. I acknowledge that Practitioner's advice,  recommendations, and/or decision may be based on factors not within their control, such as incomplete or inaccurate data provided by me or distortions of diagnostic images or specimens that may result from electronic transmissions. I understand that the practice of medicine is not an exact science and that Practitioner makes no warranties or guarantees regarding treatment outcomes. I acknowledge that I will receive a copy of this consent concurrently upon execution via email to the email address I last provided but may also request a printed copy by calling the office of [place].    I understand that my insurance will be billed for this visit.   I have read or had this consent read to me.  I understand the contents of this consent, which adequately explains the benefits and risks of the Services being provided via telemedicine.   I have been provided ample opportunity to ask questions regarding this consent and the Services and have had my questions answered to my satisfaction.  I give my informed consent for the services to be provided through the use of telemedicine in my medical care  By participating in this telemedicine visit I agree to the above.  Virtual Visit Pre-Appointment Phone Call  "(Name), I am calling you today to discuss your upcoming appointment. We are currently trying to limit exposure to the virus that causes COVID-19 by seeing patients at home rather than in the office."  1. "What is the BEST phone number to call the day of the visit?" - include this in appointment notes  2. Do you have or have access to (through a family member/friend) a smartphone with video capability that we can use for your visit?" a. If yes - list this number in appt notes as cell (if different from BEST phone #) and list the appointment type as a VIDEO visit in appointment notes b. If no - list the appointment type as a PHONE visit in appointment notes  3. Confirm consent - "In  the setting of the current Covid19 crisis, you are scheduled for a (phone or video) visit with your provider on (date) at (time).  Just as we do with many in-office visits, in order for you to participate in this visit, we must obtain consent.  If you'd like, I can send this to your mychart (if signed up) or email for you to review.  Otherwise, I can obtain your verbal consent now.  All virtual visits are billed to your insurance company just like a normal visit would be.  By agreeing to a virtual visit, we'd like you to understand that the technology does not allow for your provider to perform an examination, and thus may limit your provider's ability to fully assess your condition. If your provider identifies any concerns that need to be evaluated in person, we will make arrangements to do so.  Finally, though the technology is pretty good, we cannot assure that it will always work on either your or our end, and in the setting of a video visit, we may have to convert it to a phone-only visit.  In either situation, we cannot ensure that we have a secure connection.  Are you willing to proceed?" STAFF: Did the patient verbally acknowledge consent to telehealth visit? Document YES/NO here: YES  4. Advise patient to be prepared - "Two hours prior to your appointment, go ahead and check your blood pressure, pulse, oxygen saturation, and your weight (if you have the equipment to check those) and write them all down. When your visit starts, your provider will ask you for this information. If you have an Apple Watch or Kardia device, please plan to have heart rate information ready on the day of your appointment. Please have a pen and paper handy nearby the day of the visit as well."  5. Give patient instructions for MyChart download to smartphone OR Doximity/Doxy.me as below if video visit (depending on what platform provider is using)  6. Inform patient they will receive a phone call 15 minutes prior to their  appointment time (may be from unknown caller ID) so they should be prepared to answer    TELEPHONE CALL NOTE  JAYCI ELLEFSON has been deemed a candidate for a follow-up tele-health visit to limit community exposure during the Covid-19 pandemic. I spoke with the patient via phone to ensure availability of phone/video source, confirm preferred email & phone number, and discuss instructions and expectations.  I reminded KAJA JACKOWSKI to be prepared with any vital sign and/or heart rhythm information that could potentially be obtained via home monitoring, at the time of her visit. I reminded BRENLEIGH COLLET to expect a phone call prior to her  visit.  Jeanann Lewandowsky, [place] 02/09/2019 8:46 AM   INSTRUCTIONS FOR DOWNLOADING THE MYCHART APP TO SMARTPHONE  - The patient must first make sure to have activated MyChart and know their login information - If Apple, go to [employer] and type in MyChart in the search bar and download the app. If Android, ask patient to go to [employer] and type in [place] in the search bar and download the app. The app is free but as with any other app downloads, their phone may require them to verify saved payment information or Apple/Android password.  - The patient will need to then log into the app with their MyChart username and password, and select [place] as their healthcare provider to link the account. When it is time for your visit, go to the MyChart app, find appointments, and click Begin Video Visit. Be sure to Select Allow for your device to access the Microphone and Camera for your visit. You will then be connected, and your provider will be with you shortly.  **If they have any issues connecting, or need assistance please contact MyChart service desk [phone])**  **If using a computer, in order to ensure the best quality for their visit they will need to use either of the following Internet Browsers: [employer], or Google  Chrome**  IF USING DOXIMITY or DOXY.ME - The patient will receive a link just prior to their visit by text.     FULL LENGTH CONSENT FOR TELE-HEALTH VISIT   I hereby voluntarily request, consent and authorize [place] and its employed or contracted physicians, physician assistants, nurse practitioners or other licensed health care professionals (the Practitioner), to provide me with telemedicine health care services (the Services") as deemed necessary by the treating Practitioner. I acknowledge and consent to receive the Services by the Practitioner via telemedicine. I understand that the telemedicine visit will involve communicating with the Practitioner through live audiovisual communication technology and the disclosure of certain medical information by electronic transmission. I acknowledge that I have been given the opportunity to request an in-person assessment or other available alternative prior to the telemedicine visit and am voluntarily participating in the telemedicine visit.  I understand that I have the right to withhold or withdraw my consent to the use of telemedicine in the course of my care at any time, without affecting my right to future care or treatment, and that the Practitioner or I may terminate the telemedicine visit at any time. I understand that I have the right to inspect all information obtained and/or recorded in the course of the telemedicine visit and may receive copies of available information for a reasonable fee.  I understand that some of the potential risks of receiving the Services via telemedicine include:   Delay or interruption in medical evaluation due to technological equipment failure or disruption;  Information transmitted may not be sufficient (e.g. poor resolution of images) to allow for appropriate medical decision making by the Practitioner; and/or   In rare instances, security protocols could fail, causing a breach of personal health  information.  Furthermore, I acknowledge that it is my responsibility to provide information about my medical history, conditions and care that is complete and accurate to the best of my ability. I acknowledge that Practitioner's advice, recommendations, and/or decision may be based on factors not within their control, such as incomplete or inaccurate data provided by me or distortions of diagnostic images or specimens that may result from electronic transmissions. I understand  that the practice of medicine is not an exact science and that Practitioner makes no warranties or guarantees regarding treatment outcomes. I acknowledge that I will receive a copy of this consent concurrently upon execution via email to the email address I last provided but may also request a printed copy by calling the office of [place].    I understand that my insurance will be billed for this visit.   I have read or had this consent read to me.  I understand the contents of this consent, which adequately explains the benefits and risks of the Services being provided via telemedicine.   I have been provided ample opportunity to ask questions regarding this consent and the Services and have had my questions answered to my satisfaction.  I give my informed consent for the services to be provided through the use of telemedicine in my medical care  By participating in this telemedicine visit I agree to the above.

## 2019-02-09 NOTE — Telephone Encounter (Signed)
   Routing to Dr. Meda Coffee. [place] Siren, I saw this patient of yours "virtually" by phone today. I have a question about rate vs rhythm control.   She was recently admitted with new onset AFib, hypoxia and diastolic CHF with normal LVEF. She was treated with diuresis, rate control, Eliquis. She was able to get rate controlled on meds, then underwent TEE/DCCV to sinus bradycardia. Left atrium was severely enlarged by echo.   Today she reports she's feeling great, much better than recent admission. Her home machine says her HR is 80. They had no way to obtain EKG or figure out how to do a manual pulse check. They do have a HHRN coming out already so do not want to participate in our remote health program because of too many cooks in the kitchen. I had our office call [facility] to find out what they determined on their assessment yesterday - Kendra with [facility] stated the patient's HR was 92 and it was irregular. Pulse ox has normalized to 97%. Based on her severe LAE, I would suspect she is probably back in atrial fib. However, given marked symptomatic improvement and feeling great, my inclination is to continue rate control for now. Just wanted your thoughts. Home health will keep [place] informed on their next assessment as well.  Thanks, [employer]

## 2019-02-09 NOTE — Telephone Encounter (Signed)
Jaclyn Day, Please call pt / POA [name] and let them know you reached out to [facility] and they reported her HR was irregular. I would suspect mean she is back in atrial fibrillation but fortunately her HR is controlled so there is no cause for acute concern. She had a finding on her echo (dilated left atrium) which implied that she might be at risk to go in and out of atrial fib from time to time. This can be a common finding. As long as her HR is controlled (somewhere between 55-95 is ideal) and she is feeling good, continue plan as discussed. Janika Jedlicka PA-C

## 2019-02-10 NOTE — Telephone Encounter (Signed)
Follow up:    Patient husband calling returning call back from yesterday.

## 2019-02-10 NOTE — Telephone Encounter (Signed)
Returned Jaclyn Day's call re: pt.  Left a message for him to call back.

## 2019-02-15 ENCOUNTER — Telehealth: Payer: Self-pay | Admitting: Cardiology

## 2019-02-15 NOTE — Telephone Encounter (Signed)
Virtual Visit Pre-Appointment Phone Call  "(Name), I am calling you today to discuss your upcoming appointment. We are currently trying to limit exposure to the virus that causes COVID-19 by seeing patients at home rather than in the office."  1. "What is the BEST phone number to call the day of the visit?" - include this in appointment notes  2. Do you have or have access to (through a family member/friend) a smartphone with video capability that we can use for your visit?" a. If yes - list this number in appt notes as cell (if different from BEST phone #) and list the appointment type as a VIDEO visit in appointment notes b. If no - list the appointment type as a PHONE visit in appointment notes  3. Confirm consent - "In the setting of the current Covid19 crisis, you are scheduled for a (phone or video) visit with your provider on (date) at (time).  Just as we do with many in-office visits, in order for you to participate in this visit, we must obtain consent.  If you'd like, I can send this to your mychart (if signed up) or email for you to review.  Otherwise, I can obtain your verbal consent now.  All virtual visits are billed to your insurance company just like a normal visit would be.  By agreeing to a virtual visit, we'd like you to understand that the technology does not allow for your provider to perform an examination, and thus may limit your provider's ability to fully assess your condition. If your provider identifies any concerns that need to be evaluated in person, we will make arrangements to do so.  Finally, though the technology is pretty good, we cannot assure that it will always work on either your or our end, and in the setting of a video visit, we may have to convert it to a phone-only visit.  In either situation, we cannot ensure that we have a secure connection.  Are you willing to proceed?" STAFF: Did the patient verbally acknowledge consent to telehealth visit? Document  YES/NO here: YES  4. Advise patient to be prepared - "Two hours prior to your appointment, go ahead and check your blood pressure, pulse, oxygen saturation, and your weight (if you have the equipment to check those) and write them all down. When your visit starts, your provider will ask you for this information. If you have an Apple Watch or Kardia device, please plan to have heart rate information ready on the day of your appointment. Please have a pen and paper handy nearby the day of the visit as well."  5. Give patient instructions for MyChart download to smartphone OR Doximity/Doxy.me as below if video visit (depending on what platform provider is using)  6. Inform patient they will receive a phone call 15 minutes prior to their appointment time (may be from unknown caller ID) so they should be prepared to answer    TELEPHONE CALL NOTE  NILE DORNING has been deemed a candidate for a follow-up tele-health visit to limit community exposure during the Covid-19 pandemic. I spoke with the patient via phone to ensure availability of phone/video source, confirm preferred email & phone number, and discuss instructions and expectations.  I reminded MANREET KIERNAN to be prepared with any vital sign and/or heart rhythm information that could potentially be obtained via home monitoring, at the time of her visit. I reminded KENSLIE ABBRUZZESE to expect a phone call prior to  her visit.  Frederic Jericho 02/15/2019 9:31 AM   INSTRUCTIONS FOR DOWNLOADING THE MYCHART APP TO SMARTPHONE  - The patient must first make sure to have activated MyChart and know their login information - If Apple, go to [employer] and type in MyChart in the search bar and download the app. If Android, ask patient to go to [employer] and type in [place] in the search bar and download the app. The app is free but as with any other app downloads, their phone may require them to verify saved payment information or Apple/Android  password.  - The patient will need to then log into the app with their MyChart username and password, and select [facility] as their healthcare provider to link the account. When it is time for your visit, go to the MyChart app, find appointments, and click Begin Video Visit. Be sure to Select Allow for your device to access the Microphone and Camera for your visit. You will then be connected, and your provider will be with you shortly.  **If they have any issues connecting, or need assistance please contact MyChart service desk [phone])**  **If using a computer, in order to ensure the best quality for their visit they will need to use either of the following Internet Browsers: [employer], or Google Chrome**  IF USING DOXIMITY or DOXY.ME - The patient will receive a link just prior to their visit by text.     FULL LENGTH CONSENT FOR TELE-HEALTH VISIT   I hereby voluntarily request, consent and authorize [facility] and its employed or contracted physicians, physician assistants, nurse practitioners or other licensed health care professionals (the Practitioner), to provide me with telemedicine health care services (the Services") as deemed necessary by the treating Practitioner. I acknowledge and consent to receive the Services by the Practitioner via telemedicine. I understand that the telemedicine visit will involve communicating with the Practitioner through live audiovisual communication technology and the disclosure of certain medical information by electronic transmission. I acknowledge that I have been given the opportunity to request an in-person assessment or other available alternative prior to the telemedicine visit and am voluntarily participating in the telemedicine visit.  I understand that I have the right to withhold or withdraw my consent to the use of telemedicine in the course of my care at any time, without affecting my right to future care or treatment,  and that the Practitioner or I may terminate the telemedicine visit at any time. I understand that I have the right to inspect all information obtained and/or recorded in the course of the telemedicine visit and may receive copies of available information for a reasonable fee.  I understand that some of the potential risks of receiving the Services via telemedicine include:   Delay or interruption in medical evaluation due to technological equipment failure or disruption;  Information transmitted may not be sufficient (e.g. poor resolution of images) to allow for appropriate medical decision making by the Practitioner; and/or   In rare instances, security protocols could fail, causing a breach of personal health information.  Furthermore, I acknowledge that it is my responsibility to provide information about my medical history, conditions and care that is complete and accurate to the best of my ability. I acknowledge that Practitioner's advice, recommendations, and/or decision may be based on factors not within their control, such as incomplete or inaccurate data provided by me or distortions of diagnostic images or specimens that may result from electronic transmissions. I  understand that the practice of medicine is not an exact science and that Practitioner makes no warranties or guarantees regarding treatment outcomes. I acknowledge that I will receive a copy of this consent concurrently upon execution via email to the email address I last provided but may also request a printed copy by calling the office of [place].    I understand that my insurance will be billed for this visit.   I have read or had this consent read to me.  I understand the contents of this consent, which adequately explains the benefits and risks of the Services being provided via telemedicine.   I have been provided ample opportunity to ask questions regarding this consent and the Services and have had my questions  answered to my satisfaction.  I give my informed consent for the services to be provided through the use of telemedicine in my medical care  By participating in this telemedicine visit I agree to the above.

## 2019-02-16 ENCOUNTER — Telehealth: Payer: Self-pay | Admitting: Cardiology

## 2019-02-16 ENCOUNTER — Other Ambulatory Visit: Payer: Self-pay

## 2019-02-16 ENCOUNTER — Telehealth: Payer: 59 | Admitting: Cardiology

## 2019-02-16 ENCOUNTER — Telehealth: Payer: Self-pay

## 2019-02-16 NOTE — Telephone Encounter (Signed)
Patient had appointment at 3:15 called four times and left voicemail no answer will continue efforts.

## 2019-02-16 NOTE — Telephone Encounter (Signed)
Patient was scheduled for Televisit today and did not answer the phone after numerous attempts.  I left message for patient to call the office to reschedule appoitment and notified Becky at referring provider office, Cyndi Bender of patient not keeping appointment.  LBW

## 2019-02-17 NOTE — Telephone Encounter (Signed)
[place], [place], again regarding pt.  Left another message for him to call back.

## 2019-02-22 ENCOUNTER — Other Ambulatory Visit: Payer: Self-pay | Admitting: Family Medicine

## 2019-02-23 ENCOUNTER — Telehealth: Payer: Self-pay | Admitting: Cardiology

## 2019-02-23 ENCOUNTER — Encounter: Payer: Self-pay | Admitting: *Deleted

## 2019-02-23 NOTE — Telephone Encounter (Signed)
Home health nurse Santiago Glad states patients BP's are ok but her Apical pulse 101 and radial 74 yesterday. Patient still has 2+ edema in her feet, weight 191 lbs. Nurse states she was told by nephrology on Monday that she needs to go on dialysis.

## 2019-02-23 NOTE — Telephone Encounter (Signed)
ok 

## 2019-02-23 NOTE — Telephone Encounter (Signed)
Santiago Glad, from [facility] is calling to speak to the nurse.

## 2019-02-28 ENCOUNTER — Telehealth: Payer: 59 | Admitting: Cardiology

## 2019-08-03 ENCOUNTER — Other Ambulatory Visit: Payer: Self-pay

## 2019-08-03 ENCOUNTER — Inpatient Hospital Stay (HOSPITAL_COMMUNITY): Payer: 59

## 2019-08-03 ENCOUNTER — Emergency Department (HOSPITAL_COMMUNITY): Payer: 59

## 2019-08-03 ENCOUNTER — Inpatient Hospital Stay (HOSPITAL_COMMUNITY)
Admission: EM | Admit: 2019-08-03 | Discharge: 2019-09-24 | DRG: 870 | Disposition: A | Discharge status: Skilled Nursing Facility | Payer: 59 | Attending: Family Medicine | Admitting: Family Medicine

## 2019-08-03 DIAGNOSIS — N186 End stage renal disease: Secondary | ICD-10-CM | POA: Diagnosis present

## 2019-08-03 DIAGNOSIS — Q600 Renal agenesis, unilateral: Secondary | ICD-10-CM | POA: Diagnosis not present

## 2019-08-03 DIAGNOSIS — N179 Acute kidney failure, unspecified: Secondary | ICD-10-CM

## 2019-08-03 DIAGNOSIS — R0602 Shortness of breath: Secondary | ICD-10-CM

## 2019-08-03 DIAGNOSIS — Z79899 Other long term (current) drug therapy: Secondary | ICD-10-CM

## 2019-08-03 DIAGNOSIS — I493 Ventricular premature depolarization: Secondary | ICD-10-CM | POA: Diagnosis present

## 2019-08-03 DIAGNOSIS — F89 Unspecified disorder of psychological development: Secondary | ICD-10-CM | POA: Diagnosis present

## 2019-08-03 DIAGNOSIS — R402112 Coma scale, eyes open, never, at arrival to emergency department: Secondary | ICD-10-CM | POA: Diagnosis present

## 2019-08-03 DIAGNOSIS — R402212 Coma scale, best verbal response, none, at arrival to emergency department: Secondary | ICD-10-CM | POA: Diagnosis present

## 2019-08-03 DIAGNOSIS — I4891 Unspecified atrial fibrillation: Secondary | ICD-10-CM | POA: Diagnosis not present

## 2019-08-03 DIAGNOSIS — F419 Anxiety disorder, unspecified: Secondary | ICD-10-CM | POA: Diagnosis present

## 2019-08-03 DIAGNOSIS — S2242XA Multiple fractures of ribs, left side, initial encounter for closed fracture: Secondary | ICD-10-CM | POA: Diagnosis present

## 2019-08-03 DIAGNOSIS — E876 Hypokalemia: Secondary | ICD-10-CM | POA: Diagnosis not present

## 2019-08-03 DIAGNOSIS — J9690 Respiratory failure, unspecified, unspecified whether with hypoxia or hypercapnia: Secondary | ICD-10-CM

## 2019-08-03 DIAGNOSIS — Z6826 Body mass index (BMI) 26.0-26.9, adult: Secondary | ICD-10-CM

## 2019-08-03 DIAGNOSIS — I330 Acute and subacute infective endocarditis: Secondary | ICD-10-CM | POA: Diagnosis present

## 2019-08-03 DIAGNOSIS — R58 Hemorrhage, not elsewhere classified: Secondary | ICD-10-CM | POA: Diagnosis not present

## 2019-08-03 DIAGNOSIS — I468 Cardiac arrest due to other underlying condition: Secondary | ICD-10-CM | POA: Diagnosis present

## 2019-08-03 DIAGNOSIS — I340 Nonrheumatic mitral (valve) insufficiency: Secondary | ICD-10-CM

## 2019-08-03 DIAGNOSIS — R5383 Other fatigue: Secondary | ICD-10-CM | POA: Diagnosis not present

## 2019-08-03 DIAGNOSIS — I471 Supraventricular tachycardia: Secondary | ICD-10-CM | POA: Diagnosis not present

## 2019-08-03 DIAGNOSIS — L8990 Pressure ulcer of unspecified site, unspecified stage: Secondary | ICD-10-CM | POA: Insufficient documentation

## 2019-08-03 DIAGNOSIS — E1122 Type 2 diabetes mellitus with diabetic chronic kidney disease: Secondary | ICD-10-CM | POA: Diagnosis present

## 2019-08-03 DIAGNOSIS — Z7189 Other specified counseling: Secondary | ICD-10-CM

## 2019-08-03 DIAGNOSIS — Z8674 Personal history of sudden cardiac arrest: Secondary | ICD-10-CM | POA: Diagnosis present

## 2019-08-03 DIAGNOSIS — R778 Other specified abnormalities of plasma proteins: Secondary | ICD-10-CM

## 2019-08-03 DIAGNOSIS — Z20828 Contact with and (suspected) exposure to other viral communicable diseases: Secondary | ICD-10-CM | POA: Diagnosis present

## 2019-08-03 DIAGNOSIS — Z452 Encounter for adjustment and management of vascular access device: Secondary | ICD-10-CM

## 2019-08-03 DIAGNOSIS — R402312 Coma scale, best motor response, none, at arrival to emergency department: Secondary | ICD-10-CM | POA: Diagnosis present

## 2019-08-03 DIAGNOSIS — R6520 Severe sepsis without septic shock: Secondary | ICD-10-CM | POA: Diagnosis not present

## 2019-08-03 DIAGNOSIS — Z515 Encounter for palliative care: Secondary | ICD-10-CM

## 2019-08-03 DIAGNOSIS — Z794 Long term (current) use of insulin: Secondary | ICD-10-CM

## 2019-08-03 DIAGNOSIS — I5043 Acute on chronic combined systolic (congestive) and diastolic (congestive) heart failure: Secondary | ICD-10-CM

## 2019-08-03 DIAGNOSIS — Z885 Allergy status to narcotic agent status: Secondary | ICD-10-CM | POA: Diagnosis not present

## 2019-08-03 DIAGNOSIS — I469 Cardiac arrest, cause unspecified: Secondary | ICD-10-CM | POA: Diagnosis present

## 2019-08-03 DIAGNOSIS — R5381 Other malaise: Secondary | ICD-10-CM | POA: Diagnosis not present

## 2019-08-03 DIAGNOSIS — Z978 Presence of other specified devices: Secondary | ICD-10-CM

## 2019-08-03 DIAGNOSIS — I428 Other cardiomyopathies: Secondary | ICD-10-CM | POA: Diagnosis present

## 2019-08-03 DIAGNOSIS — I6340 Cerebral infarction due to embolism of unspecified cerebral artery: Secondary | ICD-10-CM | POA: Diagnosis present

## 2019-08-03 DIAGNOSIS — G9341 Metabolic encephalopathy: Secondary | ICD-10-CM | POA: Diagnosis present

## 2019-08-03 DIAGNOSIS — N390 Urinary tract infection, site not specified: Secondary | ICD-10-CM | POA: Diagnosis present

## 2019-08-03 DIAGNOSIS — I639 Cerebral infarction, unspecified: Secondary | ICD-10-CM | POA: Diagnosis not present

## 2019-08-03 DIAGNOSIS — I482 Chronic atrial fibrillation, unspecified: Secondary | ICD-10-CM | POA: Diagnosis not present

## 2019-08-03 DIAGNOSIS — Z4659 Encounter for fitting and adjustment of other gastrointestinal appliance and device: Secondary | ICD-10-CM

## 2019-08-03 DIAGNOSIS — R4189 Other symptoms and signs involving cognitive functions and awareness: Secondary | ICD-10-CM | POA: Diagnosis present

## 2019-08-03 DIAGNOSIS — N184 Chronic kidney disease, stage 4 (severe): Secondary | ICD-10-CM

## 2019-08-03 DIAGNOSIS — J9601 Acute respiratory failure with hypoxia: Secondary | ICD-10-CM

## 2019-08-03 DIAGNOSIS — B9561 Methicillin susceptible Staphylococcus aureus infection as the cause of diseases classified elsewhere: Secondary | ICD-10-CM | POA: Diagnosis not present

## 2019-08-03 DIAGNOSIS — R040 Epistaxis: Secondary | ICD-10-CM | POA: Diagnosis not present

## 2019-08-03 DIAGNOSIS — Z992 Dependence on renal dialysis: Secondary | ICD-10-CM | POA: Diagnosis not present

## 2019-08-03 DIAGNOSIS — Z8249 Family history of ischemic heart disease and other diseases of the circulatory system: Secondary | ICD-10-CM

## 2019-08-03 DIAGNOSIS — Z0189 Encounter for other specified special examinations: Secondary | ICD-10-CM

## 2019-08-03 DIAGNOSIS — N170 Acute kidney failure with tubular necrosis: Secondary | ICD-10-CM | POA: Diagnosis present

## 2019-08-03 DIAGNOSIS — B9620 Unspecified Escherichia coli [E. coli] as the cause of diseases classified elsewhere: Secondary | ICD-10-CM | POA: Diagnosis present

## 2019-08-03 DIAGNOSIS — Y841 Kidney dialysis as the cause of abnormal reaction of the patient, or of later complication, without mention of misadventure at the time of the procedure: Secondary | ICD-10-CM | POA: Diagnosis not present

## 2019-08-03 DIAGNOSIS — R0902 Hypoxemia: Secondary | ICD-10-CM

## 2019-08-03 DIAGNOSIS — N189 Chronic kidney disease, unspecified: Secondary | ICD-10-CM | POA: Diagnosis not present

## 2019-08-03 DIAGNOSIS — F05 Delirium due to known physiological condition: Secondary | ICD-10-CM | POA: Diagnosis not present

## 2019-08-03 DIAGNOSIS — I6319 Cerebral infarction due to embolism of other precerebral artery: Secondary | ICD-10-CM | POA: Diagnosis not present

## 2019-08-03 DIAGNOSIS — I2510 Atherosclerotic heart disease of native coronary artery without angina pectoris: Secondary | ICD-10-CM | POA: Diagnosis present

## 2019-08-03 DIAGNOSIS — S2249XA Multiple fractures of ribs, unspecified side, initial encounter for closed fracture: Secondary | ICD-10-CM | POA: Diagnosis not present

## 2019-08-03 DIAGNOSIS — T85598A Other mechanical complication of other gastrointestinal prosthetic devices, implants and grafts, initial encounter: Secondary | ICD-10-CM

## 2019-08-03 DIAGNOSIS — R34 Anuria and oliguria: Secondary | ICD-10-CM | POA: Diagnosis not present

## 2019-08-03 DIAGNOSIS — T45515A Adverse effect of anticoagulants, initial encounter: Secondary | ICD-10-CM | POA: Diagnosis not present

## 2019-08-03 DIAGNOSIS — D631 Anemia in chronic kidney disease: Secondary | ICD-10-CM | POA: Diagnosis present

## 2019-08-03 DIAGNOSIS — E872 Acidosis: Secondary | ICD-10-CM | POA: Diagnosis present

## 2019-08-03 DIAGNOSIS — I132 Hypertensive heart and chronic kidney disease with heart failure and with stage 5 chronic kidney disease, or end stage renal disease: Secondary | ICD-10-CM | POA: Diagnosis present

## 2019-08-03 DIAGNOSIS — I358 Other nonrheumatic aortic valve disorders: Secondary | ICD-10-CM | POA: Diagnosis not present

## 2019-08-03 DIAGNOSIS — R0682 Tachypnea, not elsewhere classified: Secondary | ICD-10-CM | POA: Diagnosis not present

## 2019-08-03 DIAGNOSIS — D62 Acute posthemorrhagic anemia: Secondary | ICD-10-CM | POA: Diagnosis not present

## 2019-08-03 DIAGNOSIS — T82838A Hemorrhage of vascular prosthetic devices, implants and grafts, initial encounter: Secondary | ICD-10-CM | POA: Diagnosis not present

## 2019-08-03 DIAGNOSIS — R7881 Bacteremia: Secondary | ICD-10-CM | POA: Diagnosis present

## 2019-08-03 DIAGNOSIS — N171 Acute kidney failure with acute cortical necrosis: Secondary | ICD-10-CM | POA: Diagnosis not present

## 2019-08-03 DIAGNOSIS — R1310 Dysphagia, unspecified: Secondary | ICD-10-CM | POA: Diagnosis not present

## 2019-08-03 DIAGNOSIS — L89151 Pressure ulcer of sacral region, stage 1: Secondary | ICD-10-CM | POA: Diagnosis not present

## 2019-08-03 DIAGNOSIS — E440 Moderate protein-calorie malnutrition: Secondary | ICD-10-CM | POA: Insufficient documentation

## 2019-08-03 DIAGNOSIS — D72829 Elevated white blood cell count, unspecified: Secondary | ICD-10-CM | POA: Diagnosis not present

## 2019-08-03 DIAGNOSIS — E559 Vitamin D deficiency, unspecified: Secondary | ICD-10-CM | POA: Diagnosis present

## 2019-08-03 DIAGNOSIS — J9600 Acute respiratory failure, unspecified whether with hypoxia or hypercapnia: Secondary | ICD-10-CM

## 2019-08-03 DIAGNOSIS — F39 Unspecified mood [affective] disorder: Secondary | ICD-10-CM | POA: Diagnosis present

## 2019-08-03 DIAGNOSIS — I480 Paroxysmal atrial fibrillation: Secondary | ICD-10-CM | POA: Diagnosis not present

## 2019-08-03 DIAGNOSIS — R451 Restlessness and agitation: Secondary | ICD-10-CM | POA: Diagnosis not present

## 2019-08-03 DIAGNOSIS — D638 Anemia in other chronic diseases classified elsewhere: Secondary | ICD-10-CM | POA: Diagnosis not present

## 2019-08-03 DIAGNOSIS — Z6831 Body mass index (BMI) 31.0-31.9, adult: Secondary | ICD-10-CM

## 2019-08-03 DIAGNOSIS — N289 Disorder of kidney and ureter, unspecified: Secondary | ICD-10-CM

## 2019-08-03 DIAGNOSIS — I361 Nonrheumatic tricuspid (valve) insufficiency: Secondary | ICD-10-CM | POA: Diagnosis not present

## 2019-08-03 DIAGNOSIS — I5041 Acute combined systolic (congestive) and diastolic (congestive) heart failure: Secondary | ICD-10-CM | POA: Diagnosis not present

## 2019-08-03 DIAGNOSIS — A419 Sepsis, unspecified organism: Secondary | ICD-10-CM | POA: Diagnosis not present

## 2019-08-03 DIAGNOSIS — I4819 Other persistent atrial fibrillation: Secondary | ICD-10-CM

## 2019-08-03 DIAGNOSIS — E669 Obesity, unspecified: Secondary | ICD-10-CM | POA: Diagnosis present

## 2019-08-03 DIAGNOSIS — R4182 Altered mental status, unspecified: Secondary | ICD-10-CM | POA: Diagnosis not present

## 2019-08-03 DIAGNOSIS — R29711 NIHSS score 11: Secondary | ICD-10-CM | POA: Diagnosis present

## 2019-08-03 DIAGNOSIS — D509 Iron deficiency anemia, unspecified: Secondary | ICD-10-CM | POA: Diagnosis present

## 2019-08-03 DIAGNOSIS — I959 Hypotension, unspecified: Secondary | ICD-10-CM | POA: Diagnosis not present

## 2019-08-03 DIAGNOSIS — R7989 Other specified abnormal findings of blood chemistry: Secondary | ICD-10-CM

## 2019-08-03 DIAGNOSIS — E785 Hyperlipidemia, unspecified: Secondary | ICD-10-CM | POA: Diagnosis present

## 2019-08-03 DIAGNOSIS — B370 Candidal stomatitis: Secondary | ICD-10-CM | POA: Diagnosis not present

## 2019-08-03 DIAGNOSIS — E875 Hyperkalemia: Secondary | ICD-10-CM | POA: Diagnosis present

## 2019-08-03 DIAGNOSIS — R1312 Dysphagia, oropharyngeal phase: Secondary | ICD-10-CM | POA: Diagnosis not present

## 2019-08-03 DIAGNOSIS — R1311 Dysphagia, oral phase: Secondary | ICD-10-CM | POA: Diagnosis present

## 2019-08-03 DIAGNOSIS — I472 Ventricular tachycardia: Secondary | ICD-10-CM | POA: Diagnosis present

## 2019-08-03 DIAGNOSIS — Z7901 Long term (current) use of anticoagulants: Secondary | ICD-10-CM

## 2019-08-03 LAB — CBC WITH DIFFERENTIAL/PLATELET
Abs Immature Granulocytes: 0 10*3/uL (ref 0.00–0.07)
Basophils Absolute: 0 10*3/uL (ref 0.0–0.1)
Basophils Relative: 0 %
Eosinophils Absolute: 0 10*3/uL (ref 0.0–0.5)
Eosinophils Relative: 0 %
HCT: 38 % (ref 36.0–46.0)
Hemoglobin: 11.4 g/dL — ABNORMAL LOW (ref 12.0–15.0)
Lymphocytes Relative: 3 %
Lymphs Abs: 1 10*3/uL (ref 0.7–4.0)
MCH: 29.5 pg (ref 26.0–34.0)
MCHC: 30 g/dL (ref 30.0–36.0)
MCV: 98.2 fL (ref 80.0–100.0)
Monocytes Absolute: 1 10*3/uL (ref 0.1–1.0)
Monocytes Relative: 3 %
Neutro Abs: 30.2 10*3/uL — ABNORMAL HIGH (ref 1.7–7.7)
Neutrophils Relative %: 94 %
Platelets: 278 10*3/uL (ref 150–400)
RBC: 3.87 MIL/uL (ref 3.87–5.11)
RDW: 16.3 % — ABNORMAL HIGH (ref 11.5–15.5)
WBC: 32.1 10*3/uL — ABNORMAL HIGH (ref 4.0–10.5)
nRBC: 0 % (ref 0.0–0.2)
nRBC: 0 /100 WBC

## 2019-08-03 LAB — RESPIRATORY PANEL BY PCR

## 2019-08-03 LAB — GLUCOSE, CAPILLARY
Glucose-Capillary: 144 mg/dL — ABNORMAL HIGH (ref 70–99)
Glucose-Capillary: 104 mg/dL — ABNORMAL HIGH (ref 70–99)
Glucose-Capillary: 97 mg/dL (ref 70–99)

## 2019-08-03 LAB — URINALYSIS, ROUTINE W REFLEX MICROSCOPIC
Bilirubin Urine: NEGATIVE
Glucose, UA: NEGATIVE mg/dL
Ketones, ur: NEGATIVE mg/dL
Nitrite: NEGATIVE
Protein, ur: >=300 mg/dL — AB
RBC / HPF: >50 RBC/hpf — ABNORMAL HIGH (ref 0–5)
Specific Gravity, Urine: 1.013 (ref 1.005–1.030)
WBC, UA: >50 WBC/hpf — ABNORMAL HIGH (ref 0–5)
pH: 8 (ref 5.0–8.0)

## 2019-08-03 LAB — COMPREHENSIVE METABOLIC PANEL
ALT: 20 U/L (ref 0–44)
AST: 26 U/L (ref 15–41)
Albumin: 2.3 g/dL — ABNORMAL LOW (ref 3.5–5.0)
Alkaline Phosphatase: 94 U/L (ref 38–126)
Anion gap: 13 (ref 5–15)
BUN: 37 mg/dL — ABNORMAL HIGH (ref 8–23)
CO2: 18 mmol/L — ABNORMAL LOW (ref 22–32)
Calcium: 8.2 mg/dL — ABNORMAL LOW (ref 8.9–10.3)
Chloride: 111 mmol/L (ref 98–111)
Creatinine, Ser: 2.64 mg/dL — ABNORMAL HIGH (ref 0.44–1.00)
GFR calc Af Amer: 20 mL/min — ABNORMAL LOW (ref >60)
GFR calc non Af Amer: 17 mL/min — ABNORMAL LOW (ref >60)
Glucose, Bld: 145 mg/dL — ABNORMAL HIGH (ref 70–99)
Potassium: 3.7 mmol/L (ref 3.5–5.1)
Sodium: 142 mmol/L (ref 135–145)
Total Bilirubin: 1.2 mg/dL (ref 0.3–1.2)
Total Protein: 4.8 g/dL — ABNORMAL LOW (ref 6.5–8.1)

## 2019-08-03 LAB — MAGNESIUM: Magnesium: 1.8 mg/dL (ref 1.7–2.4)

## 2019-08-03 LAB — STREP PNEUMONIAE URINARY ANTIGEN: Strep Pneumo Urinary Antigen: NEGATIVE

## 2019-08-03 LAB — BASIC METABOLIC PANEL
Anion gap: 13 (ref 5–15)
BUN: 41 mg/dL — ABNORMAL HIGH (ref 8–23)
CO2: 16 mmol/L — ABNORMAL LOW (ref 22–32)
Calcium: 8.1 mg/dL — ABNORMAL LOW (ref 8.9–10.3)
Chloride: 111 mmol/L (ref 98–111)
Creatinine, Ser: 2.73 mg/dL — ABNORMAL HIGH (ref 0.44–1.00)
GFR calc Af Amer: 19 mL/min — ABNORMAL LOW (ref >60)
GFR calc non Af Amer: 17 mL/min — ABNORMAL LOW (ref >60)
Glucose, Bld: 127 mg/dL — ABNORMAL HIGH (ref 70–99)
Potassium: 3.8 mmol/L (ref 3.5–5.1)
Sodium: 140 mmol/L (ref 135–145)

## 2019-08-03 LAB — POCT I-STAT 7, (LYTES, BLD GAS, ICA,H+H)
Acid-base deficit: 7 mmol/L — ABNORMAL HIGH (ref 0.0–2.0)
Bicarbonate: 18.6 mmol/L — ABNORMAL LOW (ref 20.0–28.0)
Calcium, Ion: 1.16 mmol/L (ref 1.15–1.40)
HCT: 34 % — ABNORMAL LOW (ref 36.0–46.0)
Hemoglobin: 11.6 g/dL — ABNORMAL LOW (ref 12.0–15.0)
O2 Saturation: 100 %
Potassium: 3.2 mmol/L — ABNORMAL LOW (ref 3.5–5.1)
Sodium: 142 mmol/L (ref 135–145)
TCO2: 20 mmol/L — ABNORMAL LOW (ref 22–32)
pCO2 arterial: 36.5 mmHg (ref 32.0–48.0)
pH, Arterial: 7.315 — ABNORMAL LOW (ref 7.350–7.450)
pO2, Arterial: 408 mmHg — ABNORMAL HIGH (ref 83.0–108.0)

## 2019-08-03 LAB — PROTIME-INR
INR: 1.4 — ABNORMAL HIGH (ref 0.8–1.2)
INR: 1.5 — ABNORMAL HIGH (ref 0.8–1.2)
Prothrombin Time: 16.9 seconds — ABNORMAL HIGH (ref 11.4–15.2)
Prothrombin Time: 17.8 seconds — ABNORMAL HIGH (ref 11.4–15.2)

## 2019-08-03 LAB — CBG MONITORING, ED: Glucose-Capillary: 113 mg/dL — ABNORMAL HIGH (ref 70–99)

## 2019-08-03 LAB — TROPONIN I (HIGH SENSITIVITY)
Troponin I (High Sensitivity): 48 ng/L — ABNORMAL HIGH (ref <18)
Troponin I (High Sensitivity): 715 ng/L (ref <18)
Troponin I (High Sensitivity): 2793 ng/L (ref <18)

## 2019-08-03 LAB — SARS CORONAVIRUS 2 BY RT PCR (HOSPITAL ORDER, PERFORMED IN ~~LOC~~ HOSPITAL LAB): SARS Coronavirus 2: NEGATIVE

## 2019-08-03 LAB — MRSA PCR SCREENING: MRSA by PCR: NEGATIVE

## 2019-08-03 LAB — HEMOGLOBIN A1C
Hgb A1c MFr Bld: 5.4 % (ref 4.8–5.6)
Mean Plasma Glucose: 108.28 mg/dL

## 2019-08-03 LAB — D-DIMER, QUANTITATIVE: D-Dimer, Quant: 11.22 ug/mL-FEU — ABNORMAL HIGH (ref 0.00–0.50)

## 2019-08-03 LAB — PROCALCITONIN
Procalcitonin: 0.88 ng/mL
Procalcitonin: 2.79 ng/mL

## 2019-08-03 LAB — LIPASE, BLOOD: Lipase: 24 U/L (ref 11–51)

## 2019-08-03 LAB — ECHOCARDIOGRAM COMPLETE
Height: 66 in
Weight: 3086.44 oz

## 2019-08-03 LAB — HEPARIN LEVEL (UNFRACTIONATED): Heparin Unfractionated: 1.22 IU/mL — ABNORMAL HIGH (ref 0.30–0.70)

## 2019-08-03 LAB — LACTIC ACID, PLASMA
Lactic Acid, Venous: 4.3 mmol/L (ref 0.5–1.9)
Lactic Acid, Venous: 1.6 mmol/L (ref 0.5–1.9)
Lactic Acid, Venous: 1.9 mmol/L (ref 0.5–1.9)

## 2019-08-03 LAB — APTT: aPTT: 23 seconds — ABNORMAL LOW (ref 24–36)

## 2019-08-03 MED ORDER — FENTANYL CITRATE (PF) 100 MCG/2ML IJ SOLN
50.0000 ug | Freq: Once | INTRAMUSCULAR | Status: DC
  Filled 2019-08-03: qty 2

## 2019-08-03 MED ORDER — AMIODARONE HCL IN DEXTROSE 360-4.14 MG/200ML-% IV SOLN
30.0000 mg/h | INTRAVENOUS | Status: DC
  Administered 2019-08-04 – 2019-08-10 (×12): 30 mg/h via INTRAVENOUS
  Filled 2019-08-03 (×15): qty 200

## 2019-08-03 MED ORDER — POTASSIUM CHLORIDE 10 MEQ/50ML IV SOLN
10.0000 meq | INTRAVENOUS | Status: AC
  Administered 2019-08-03 (×2): 10 meq via INTRAVENOUS
  Filled 2019-08-03: qty 50

## 2019-08-03 MED ORDER — FENTANYL BOLUS VIA INFUSION
25.0000 ug | INTRAVENOUS | Status: DC | PRN
  Filled 2019-08-03: qty 25

## 2019-08-03 MED ORDER — SODIUM CHLORIDE 0.9 % IV SOLN
INTRAVENOUS | Status: DC
  Administered 2019-08-03: 12:00:00 via INTRAVENOUS

## 2019-08-03 MED ORDER — AMIODARONE LOAD VIA INFUSION
150.0000 mg | Freq: Once | INTRAVENOUS | Status: AC
  Administered 2019-08-03: 150 mg via INTRAVENOUS
  Filled 2019-08-03: qty 83.34

## 2019-08-03 MED ORDER — HEPARIN (PORCINE) 25000 UT/250ML-% IV SOLN
1150.0000 [IU]/h | INTRAVENOUS | Status: DC
  Administered 2019-08-03: 15:00:00 1100 [IU]/h via INTRAVENOUS
  Administered 2019-08-04: 900 [IU]/h via INTRAVENOUS
  Administered 2019-08-05 – 2019-08-06 (×2): 1000 [IU]/h via INTRAVENOUS
  Administered 2019-08-08: 1100 [IU]/h via INTRAVENOUS
  Administered 2019-08-10: 21:00:00 1450 [IU]/h via INTRAVENOUS
  Administered 2019-08-10: 1100 [IU]/h via INTRAVENOUS
  Administered 2019-08-11: 1600 [IU]/h via INTRAVENOUS
  Administered 2019-08-12 (×2): 1500 [IU]/h via INTRAVENOUS
  Administered 2019-08-13 – 2019-08-14 (×2): 1250 [IU]/h via INTRAVENOUS
  Administered 2019-08-16: 22:00:00 1150 [IU]/h via INTRAVENOUS
  Administered 2019-08-16: 1250 [IU]/h via INTRAVENOUS
  Administered 2019-08-16: 1150 [IU]/h via INTRAVENOUS
  Filled 2019-08-03 (×14): qty 250

## 2019-08-03 MED ORDER — SODIUM CHLORIDE 0.9 % IV SOLN: INTRAVENOUS | Status: DC

## 2019-08-03 MED ORDER — ATORVASTATIN CALCIUM 10 MG PO TABS
20.0000 mg | ORAL_TABLET | Freq: Every day | ORAL | Status: DC
  Administered 2019-08-04 – 2019-08-08 (×5): 20 mg via NASOGASTRIC
  Filled 2019-08-03 (×5): qty 2

## 2019-08-03 MED ORDER — MAGNESIUM SULFATE 2 GM/50ML IV SOLN
2.0000 g | Freq: Once | INTRAVENOUS | Status: AC
  Administered 2019-08-03: 2 g via INTRAVENOUS
  Filled 2019-08-03: qty 50

## 2019-08-03 MED ORDER — AMIODARONE HCL IN DEXTROSE 360-4.14 MG/200ML-% IV SOLN
60.0000 mg/h | INTRAVENOUS | Status: AC
  Administered 2019-08-03 (×2): 60 mg/h via INTRAVENOUS
  Filled 2019-08-03 (×2): qty 200

## 2019-08-03 MED ORDER — SODIUM CHLORIDE 0.9 % IV BOLUS (SEPSIS)
1000.0000 mL | Freq: Once | INTRAVENOUS | Status: AC
  Administered 2019-08-03: 1000 mL via INTRAVENOUS

## 2019-08-03 MED ORDER — ETOMIDATE 2 MG/ML IV SOLN
INTRAVENOUS | Status: AC | PRN
  Administered 2019-08-03: 20 mg via INTRAVENOUS

## 2019-08-03 MED ORDER — MIDAZOLAM HCL 2 MG/2ML IJ SOLN
1.0000 mg | Freq: Once | INTRAMUSCULAR | Status: DC
  Filled 2019-08-03: qty 2

## 2019-08-03 MED ORDER — POTASSIUM CHLORIDE 10 MEQ/100ML IV SOLN: 10.0000 meq | INTRAVENOUS | Status: DC

## 2019-08-03 MED ORDER — PANTOPRAZOLE SODIUM 40 MG IV SOLR
40.0000 mg | Freq: Every day | INTRAVENOUS | Status: DC
  Administered 2019-08-03 – 2019-08-04 (×2): 40 mg via INTRAVENOUS
  Filled 2019-08-03 (×2): qty 40

## 2019-08-03 MED ORDER — SODIUM CHLORIDE 0.9 % IV SOLN
INTRAVENOUS | Status: DC
  Administered 2019-08-03 – 2019-08-12 (×3): via INTRAVENOUS

## 2019-08-03 MED ORDER — MIDAZOLAM BOLUS VIA INFUSION
1.0000 mg | INTRAVENOUS | Status: DC | PRN
  Filled 2019-08-03: qty 1

## 2019-08-03 MED ORDER — SODIUM CHLORIDE 0.9 % IV SOLN: 1000.0000 mL | INTRAVENOUS | Status: DC

## 2019-08-03 MED ORDER — ROCURONIUM BROMIDE 50 MG/5ML IV SOLN
INTRAVENOUS | Status: AC | PRN
  Administered 2019-08-03: 100 mg via INTRAVENOUS

## 2019-08-03 MED ORDER — ASPIRIN 300 MG RE SUPP
300.0000 mg | RECTAL | Status: AC
  Administered 2019-08-03: 14:00:00 300 mg via RECTAL
  Filled 2019-08-03: qty 1

## 2019-08-03 MED ORDER — CHLORHEXIDINE GLUCONATE CLOTH 2 % EX PADS
6.0000 | MEDICATED_PAD | Freq: Every day | CUTANEOUS | Status: DC
  Administered 2019-08-03 – 2019-08-12 (×9): 6 via TOPICAL

## 2019-08-03 MED ORDER — PHENYLEPHRINE 40 MCG/ML (10ML) SYRINGE FOR IV PUSH (FOR BLOOD PRESSURE SUPPORT)
40.0000 ug | PREFILLED_SYRINGE | Freq: Once | INTRAVENOUS | Status: AC
  Administered 2019-08-03: 10:00:00 40 ug via INTRAVENOUS

## 2019-08-03 MED ORDER — INSULIN ASPART 100 UNIT/ML ~~LOC~~ SOLN
0.0000 [IU] | SUBCUTANEOUS | Status: DC
  Administered 2019-08-03 – 2019-08-21 (×39): 1 [IU] via SUBCUTANEOUS
  Administered 2019-08-22: 22:00:00 2 [IU] via SUBCUTANEOUS
  Administered 2019-08-22 – 2019-08-25 (×12): 1 [IU] via SUBCUTANEOUS
  Administered 2019-08-25: 2 [IU] via SUBCUTANEOUS
  Administered 2019-08-26: 1 [IU] via SUBCUTANEOUS
  Administered 2019-08-26: 2 [IU] via SUBCUTANEOUS
  Administered 2019-08-26 – 2019-08-27 (×3): 1 [IU] via SUBCUTANEOUS
  Administered 2019-08-27: 2 [IU] via SUBCUTANEOUS
  Administered 2019-08-27 – 2019-08-28 (×6): 1 [IU] via SUBCUTANEOUS
  Administered 2019-08-28: 2 [IU] via SUBCUTANEOUS
  Administered 2019-08-29: 1 [IU] via SUBCUTANEOUS
  Administered 2019-08-29: 2 [IU] via SUBCUTANEOUS
  Administered 2019-08-29 – 2019-08-30 (×2): 1 [IU] via SUBCUTANEOUS
  Administered 2019-08-30: 2 [IU] via SUBCUTANEOUS
  Administered 2019-08-30 (×2): 1 [IU] via SUBCUTANEOUS
  Administered 2019-08-31: 2 [IU] via SUBCUTANEOUS
  Administered 2019-08-31 – 2019-09-01 (×4): 1 [IU] via SUBCUTANEOUS
  Administered 2019-09-01 (×2): 2 [IU] via SUBCUTANEOUS
  Administered 2019-09-02 (×2): 1 [IU] via SUBCUTANEOUS
  Administered 2019-09-03: 2 [IU] via SUBCUTANEOUS
  Administered 2019-09-03 (×3): 1 [IU] via SUBCUTANEOUS
  Administered 2019-09-04: 2 [IU] via SUBCUTANEOUS
  Administered 2019-09-04 – 2019-09-07 (×3): 1 [IU] via SUBCUTANEOUS
  Administered 2019-09-07 – 2019-09-08 (×2): 2 [IU] via SUBCUTANEOUS
  Administered 2019-09-11: 1 [IU] via SUBCUTANEOUS
  Administered 2019-09-12 – 2019-09-16 (×3): 2 [IU] via SUBCUTANEOUS
  Administered 2019-09-17 – 2019-09-18 (×4): 1 [IU] via SUBCUTANEOUS
  Administered 2019-09-18: 2 [IU] via SUBCUTANEOUS
  Administered 2019-09-19 – 2019-09-23 (×4): 1 [IU] via SUBCUTANEOUS

## 2019-08-03 MED ORDER — SODIUM CHLORIDE 0.9 % IV BOLUS
1000.0000 mL | Freq: Once | INTRAVENOUS | Status: AC
  Administered 2019-08-03: 10:00:00 1000 mL via INTRAVENOUS

## 2019-08-03 MED ORDER — ASPIRIN 300 MG RE SUPP: 300.0000 mg | RECTAL | Status: DC

## 2019-08-03 MED ORDER — SODIUM CHLORIDE 0.9 % IV SOLN
2.0000 g | INTRAVENOUS | Status: DC
  Administered 2019-08-03 – 2019-08-05 (×3): 2 g via INTRAVENOUS
  Filled 2019-08-03 (×3): qty 20
  Filled 2019-08-03: qty 2

## 2019-08-03 MED ORDER — FENTANYL 2500MCG IN NS 250ML (10MCG/ML) PREMIX INFUSION
100.0000 ug/h | INTRAVENOUS | Status: DC
  Administered 2019-08-03: 20:00:00 50 ug/h via INTRAVENOUS
  Filled 2019-08-03: qty 250

## 2019-08-03 MED ORDER — MIDAZOLAM 50MG/50ML (1MG/ML) PREMIX INFUSION: 2.0000 mg/h | INTRAVENOUS | Status: DC

## 2019-08-03 MED ORDER — NOREPINEPHRINE 4 MG/250ML-% IV SOLN
0.0000 ug/min | INTRAVENOUS | Status: DC
  Administered 2019-08-03: 5 ug/min via INTRAVENOUS
  Filled 2019-08-03: qty 250

## 2019-08-03 NOTE — ED Notes (Signed)
Date and time results received: 08/03/19  (use smartphrase ".now" to insert current time)  Test: Troponin Critical Value: 715ng Name of Provider Notified: Chase Caller

## 2019-08-03 NOTE — Progress Notes (Signed)
Pt with minimal u/o since arrival to unit. MD made aware. No new orders at this time. Will continue to monitor.

## 2019-08-03 NOTE — ED Notes (Signed)
Arctic pads placed on pt , awaiting covid results to take pt to have CT of head , CCM updated on delay

## 2019-08-03 NOTE — Progress Notes (Signed)
Responded to page to support staff and patient.  Patient has been made code cool. Staff has been in contact with family and had been waiting for a good while.  Around 12:15pm a gentlemen name Aldean Jewett arrived and said he was her POA and that he takes care of her and they have been boyfriend and girlfriend for 27 years..  He also said the patients sister was in route from [facility] [place] she  left for hospital  Around 12 Noon  08/03/2019. Chaplain escorted Mr. Levon Hedger to consultation room to speak with Dr. Patient is going to ICU. Nursing staff advising Mr. Levon Hedger.  Provided emotional support and guidance to Mr. Levon Hedger.  Chaplain available as needed.    Jaclynn Major, [place], [facility], Pager [phone]

## 2019-08-03 NOTE — Progress Notes (Signed)
RT NOTE: RT transported patient from trama B to 2H05 with RN. No complications. VS stable. RT will continue to monitor.

## 2019-08-03 NOTE — ED Notes (Signed)
Date and time results received: 08/03/19 (use smartphrase ".now" to insert current time)  Test: Lactic Acid Critical Value: 4.3  Name of Provider Notified:  Tomi Bamberger

## 2019-08-03 NOTE — ED Triage Notes (Signed)
Pt found by family unresponsive at home. LSW 30 min prior. CPR initiated by fire. 25min of CPR with 1 epi. HR 120-200 irregular. 18g RAC, IO l tib/fib. Pt smells of UTI. cbg 127. Pt normally a&O x4, ambulatory. Unresponsive on arrival. Vitals on arrival 97.3temporal, 130hr, 19rr being bagged with king airway, 165/150bp. +breath sounds.

## 2019-08-03 NOTE — ED Provider Notes (Signed)
[facility] EMERGENCY DEPARTMENT Provider Note   CSN: [id number] Arrival date & time: 08/03/19  0935     History   Chief Complaint Chief Complaint  Patient presents with  . Cardiac Arrest    HPI Jaclyn Day is a 73 y.o. female.     HPI   Patient presents after last known normal 30 minutes ago for an unknown rhythm witnessed cardiac arrest.  Patient's boyfriend reports that he attempted to do CPR prior to EMS arrival.  According to EMS, 1 round of CPR with epinephrine was given prior to return of spontaneous circulation.  Patient arrived with a King airway.  She is a GCS 3, and history/ROS are very limited.  Past Medical History:  Diagnosis Date  . Anemia   . Chronic diastolic CHF (congestive heart failure) ([place])   . CKD (chronic kidney disease), stage IV ([place])   . Hypertension   . Persistent atrial fibrillation    a. dx 01/2019 s/p TEE DCCV.  Marland Kitchen Renal disorder   . Unilateral congenital absence of kidney     Patient Active Problem List   Diagnosis Date Noted  . Cardiac arrest ([place]) 08/03/2019  . Atrial fibrillation with RVR ([place]) 01/25/2019  . CHF (congestive heart failure) ([place]) 01/25/2019  . Hypoxia   . Renal insufficiency   . Renal disorder   . Hypertension     Past Surgical History:  Procedure Laterality Date  . CARDIOVERSION N/A 01/31/2019   Procedure: CARDIOVERSION;  Surgeon: Lelon Perla, MD;  Location: [facility] ENDOSCOPY;  Service: Cardiovascular;  Laterality: N/A;  . TEE WITHOUT CARDIOVERSION N/A 01/31/2019   Procedure: TRANSESOPHAGEAL ECHOCARDIOGRAM (TEE);  Surgeon: Lelon Perla, MD;  Location[facility] ENDOSCOPY;  Service: Cardiovascular;  Laterality: N/A;     OB History   No obstetric history on file.      Home Medications    Prior to Admission medications   Medication Sig Start Date End Date Taking? Authorizing Provider  amLODipine (NORVASC) 10 MG tablet Take 10 mg by mouth daily. 12/30/18   [provider]   apixaban (ELIQUIS) 5 MG TABS tablet Take 1 tablet (5 mg total) by mouth 2 (two) times daily. 01/31/19   Wilber Oliphant, MD  ARIPiprazole (ABILIFY) 10 MG tablet Take 5 mg by mouth 2 (two) times daily. 12/27/18   [provider]  atorvastatin (LIPITOR) 20 MG tablet Take 20 mg by mouth daily.    [provider]  brimonidine (ALPHAGAN) 0.15 % ophthalmic solution Place 1 drop into the left eye 2 (two) times daily.     [provider]  divalproex (DEPAKOTE) 500 MG DR tablet Take 1,000 mg by mouth at bedtime. 12/11/18   [provider]  dorzolamide-timolol (COSOPT) 22.3-6.8 MG/ML ophthalmic solution Place 1 drop into both eyes 2 (two) times daily. 01/24/19   [provider]  DULoxetine (CYMBALTA) 60 MG capsule Take 60 mg by mouth daily. 01/01/19   [provider]  furosemide (LASIX) 40 MG tablet Take 1 tablet (40 mg total) by mouth daily. 02/01/19   Wilber Oliphant, MD  metoprolol succinate (TOPROL-XL) 50 MG 24 hr tablet Take 100 mg by mouth daily. 01/23/19   [provider]    Family History Family History  Problem Relation Age of Onset  . Atrial fibrillation Father   . Atrial fibrillation Brother     Social History Social History   Tobacco Use  . Smoking status: Never Smoker  . Smokeless tobacco: Never Used  Substance Use Topics  . Alcohol use: Not Currently  . Drug use: Not Currently     Allergies   Patient has no known allergies.   Review of Systems Review of Systems  Unable to perform ROS: Acuity of condition     Physical Exam Updated Vital Signs BP 104/71   Pulse 87   Temp (!) 96.3 F (35.7 C)   Resp 20   Ht 5\' 6"  (1.676 m)   Wt 87.5 kg   SpO2 100%   BMI 31.14 kg/m   Physical Exam Vitals signs and nursing note reviewed.  Constitutional:      Appearance: She is well-developed. She is obese.  HENT:     Head: Normocephalic and atraumatic.     Mouth/Throat:     Mouth: Mucous membranes are moist.  Eyes:      Conjunctiva/sclera: Conjunctivae normal.  Neck:     Musculoskeletal: Neck supple.  Cardiovascular:     Rate and Rhythm: Regular rhythm. Tachycardia present.     Heart sounds: No murmur.  Pulmonary:     Breath sounds: No wheezing.     Comments: Bilateral breath sounds, king airway present Abdominal:     General: There is no distension.     Palpations: Abdomen is soft. There is no mass.     Hernia: No hernia is present.  Musculoskeletal:     Right lower leg: No edema.     Left lower leg: No edema.     Comments: Left lower ankle is in a removable cast  Skin:    General: Skin is warm and dry.  Neurological:     Comments: GCS 3      ED Treatments / Results  Labs (all labs ordered are listed, but only abnormal results are displayed) Labs Reviewed  LACTIC ACID, PLASMA - Abnormal; Notable for the following components:      Result Value   Lactic Acid, Venous 4.3 (*)    All other components within normal limits  COMPREHENSIVE METABOLIC PANEL - Abnormal; Notable for the following components:   CO2 18 (*)    Glucose, Bld 145 (*)    BUN 37 (*)    Creatinine, Ser 2.64 (*)    Calcium 8.2 (*)    Total Protein 4.8 (*)    Albumin 2.3 (*)    GFR calc non Af Amer 17 (*)    GFR calc Af Amer 20 (*)    All other components within normal limits  CBC WITH DIFFERENTIAL/PLATELET - Abnormal; Notable for the following components:   WBC 32.1 (*)    Hemoglobin 11.4 (*)    RDW 16.3 (*)    Neutro Abs 30.2 (*)    All other components within normal limits  PROTIME-INR - Abnormal; Notable for the following components:   Prothrombin Time 16.9 (*)    INR 1.4 (*)    All other components within normal limits  APTT - Abnormal; Notable for the following components:   aPTT 23 (*)    All other components within normal limits  URINALYSIS, ROUTINE W REFLEX MICROSCOPIC - Abnormal; Notable for the following components:   APPearance TURBID (*)    Hgb urine dipstick SMALL (*)    Protein, ur >=300 (*)     Leukocytes,Ua LARGE (*)    RBC / HPF >50 (*)    WBC, UA >50 (*)    Bacteria, UA MANY (*)    All other components within normal limits  D-DIMER, QUANTITATIVE (NOT AT [facility]) -  Abnormal; Notable for the following components:   D-Dimer, Quant 11.22 (*)    All other components within normal limits  HEPARIN LEVEL (UNFRACTIONATED) - Abnormal; Notable for the following components:   Heparin Unfractionated 1.22 (*)    All other components within normal limits  GLUCOSE, CAPILLARY - Abnormal; Notable for the following components:   Glucose-Capillary 144 (*)    All other components within normal limits  CBG MONITORING, ED - Abnormal; Notable for the following components:   Glucose-Capillary 113 (*)    All other components within normal limits  POCT I-STAT 7, (LYTES, BLD GAS, ICA,H+H) - Abnormal; Notable for the following components:   pH, Arterial 7.315 (*)    pO2, Arterial 408.0 (*)    Bicarbonate 18.6 (*)    TCO2 20 (*)    Acid-base deficit 7.0 (*)    Potassium 3.2 (*)    HCT 34.0 (*)    Hemoglobin 11.6 (*)    All other components within normal limits  TROPONIN I (HIGH SENSITIVITY) - Abnormal; Notable for the following components:   Troponin I (High Sensitivity) 48 (*)    All other components within normal limits  TROPONIN I (HIGH SENSITIVITY) - Abnormal; Notable for the following components:   Troponin I (High Sensitivity) 715 (*)    All other components within normal limits  SARS CORONAVIRUS 2 BY RT PCR (HOSPITAL ORDER, [facility])  CULTURE, BLOOD (ROUTINE X 2)  CULTURE, BLOOD (ROUTINE X 2)  URINE CULTURE  RESPIRATORY PANEL BY PCR  MRSA PCR SCREENING  MAGNESIUM  LIPASE, BLOOD  PROCALCITONIN  HEMOGLOBIN [id number]  BASIC METABOLIC PANEL  LACTIC ACID, PLASMA  LACTIC ACID, PLASMA  PROCALCITONIN  STREP PNEUMONIAE URINARY ANTIGEN  LEGIONELLA PNEUMOPHILA SEROGP 1 UR AG  APTT  LACTIC ACID, PLASMA  I-STAT ARTERIAL BLOOD GAS, ED  TROPONIN I (HIGH SENSITIVITY)     EKG EKG Interpretation  Date/Time:  Thursday August 03 2019 09:35:50 EDT Ventricular Rate:  120 PR Interval:    QRS Duration: 132 QT Interval:  347 QTC Calculation: 491 R Axis:   93 Text Interpretation:  Atrial fibrillation RBBB and LPFB ST depr, consider ischemia, anterolateral lds atrial fibrilaltion is new since last tracing, st depression is new Confirmed by Dorie Rank [phone]) on 08/03/2019 9:49:01 AM   Radiology Ct Head Wo Contrast  Result Date: 08/03/2019 CLINICAL DATA:  Found unresponsive EXAM: CT HEAD WITHOUT CONTRAST TECHNIQUE: Contiguous axial images were obtained from the base of the skull through the vertex without intravenous contrast. COMPARISON:  None. FINDINGS: Brain: Mild chronic small vessel disease throughout the deep white matter. Mild cerebral atrophy. No acute intracranial abnormality. Specifically, no hemorrhage, hydrocephalus, mass lesion, acute infarction, or significant intracranial injury. Vascular: No hyperdense vessel or unexpected calcification. Skull: No acute calvarial abnormality. Sinuses/Orbits: Visualized paranasal sinuses and mastoids clear. Orbital soft tissues unremarkable. Other: None IMPRESSION: Atrophy, chronic microvascular disease. No acute intracranial abnormality. Electronically Signed   By: Rolm Baptise M.D.   On: 08/03/2019 13:37   Dg Chest Portable 1 View  Result Date: 08/03/2019 CLINICAL DATA:  ET tube placement EXAM: PORTABLE CHEST 1 VIEW COMPARISON:  01/25/2019 FINDINGS: Endotracheal tube is 2 cm above the carina. NG tube is in the stomach. Cardiomegaly. Bilateral perihilar and lower lobe opacities, right greater than left could reflect edema or infection. No visible effusions or acute bony abnormality. IMPRESSION: Endotracheal tube 2 cm above the carina. Cardiomegaly. Mild perihilar and lower lobe opacities, right greater than left could reflect edema  or infection. Electronically Signed   By: Rolm Baptise M.D.   On: 08/03/2019 10:48     Procedures Procedure Name: Intubation Date/Time: 08/03/2019 10:00 AM Performed by: Julianne Rice, MD Pre-anesthesia Checklist: Patient identified, Emergency Drugs available, Suction available, Patient being monitored and Timeout performed Oxygen Delivery Method: Ambu bag Preoxygenation: Pre-oxygenation with 100% oxygen Induction Type: Rapid sequence Ventilation: Two handed mask ventilation required and Mask ventilation without difficulty Laryngoscope Size: Glidescope and 4 Grade View: Grade I Tube size: 7.5 mm Number of attempts: 1 Airway Equipment and Method: Rigid stylet and Video-laryngoscopy Secured at: 25 cm Tube secured with: ETT holder Dental Injury: Teeth and Oropharynx as per pre-operative assessment  Difficulty Due To: Difficulty was unanticipated     .Central Line  Date/Time: 08/03/2019 10:15 AM Performed by: Julianne Rice, MD Authorized by: Brand Males, MD   Consent:    Consent obtained:  Emergent situation Universal protocol:    Patient identity confirmed:  Arm band Pre-procedure details:    Hand hygiene: Hand hygiene performed prior to insertion     Sterile barrier technique: All elements of maximal sterile technique followed     Skin preparation:  2% chlorhexidine and ChloraPrep   Skin preparation agent: Skin preparation agent completely dried prior to procedure   Procedure details:    Location:  L femoral   Site selection rationale:  Better vascular access seen on ultrasound   Patient position:  Flat   Procedural supplies:  Triple lumen   Landmarks identified: yes     Ultrasound guidance: yes     Sterile ultrasound techniques: Sterile gel and sterile probe covers were used     Number of attempts:  1   Successful placement: yes   Post-procedure details:    Post-procedure:  Line sutured and dressing applied   Assessment:  Blood return through all ports   Patient tolerance of procedure:  Tolerated well, no immediate complications    (including critical care time)  Medications Ordered in ED Medications  sodium chloride 0.9 % bolus 1,000 mL (0 mLs Intravenous Stopped 08/03/19 1227)    Followed by  0.9 %  sodium chloride infusion (0 mLs Intravenous Duplicate [id number])  sodium chloride 0.9 % bolus 1,000 mL (0 mLs Intravenous Stopped 08/03/19 1055)    And  0.9 %  sodium chloride infusion ( Intravenous Stopped 08/03/19 1411)  norepinephrine (LEVOPHED) 4mg  in 265mL premix infusion (0 mcg/min Intravenous Not Given 08/03/19 1513)  0.9 %  sodium chloride infusion ( Intravenous Stopped 08/03/19 1543)  fentaNYL (SUBLIMAZE) injection 50 mcg (50 mcg Intravenous Not Given 08/03/19 1533)  fentaNYL 2528mcg in NS 273mL (30mcg/ml) infusion-PREMIX (100 mcg/hr Intravenous Not Given 08/03/19 1513)  fentaNYL (SUBLIMAZE) bolus via infusion 25 mcg (has no administration in time range)  midazolam (VERSED) injection 1 mg (1 mg Intravenous Not Given 08/03/19 1534)  midazolam (VERSED) 50 mg/50 mL (1 mg/mL) premix infusion (2 mg/hr Intravenous Not Given 08/03/19 1513)  midazolam (VERSED) bolus via infusion 1 mg (has no administration in time range)  pantoprazole (PROTONIX) injection 40 mg (has no administration in time range)  atorvastatin (LIPITOR) tablet 20 mg (20 mg Per NG tube Not Given 08/03/19 1534)  insulin aspart (novoLOG) injection 0-9 Units (1 Units Subcutaneous Given 08/03/19 1612)  cefTRIAXone (ROCEPHIN) 2 g in sodium chloride 0.9 % 100 mL IVPB (0 g Intravenous Stopped 08/03/19 1409)  Chlorhexidine Gluconate Cloth 2 % PADS 6 each (6 each Topical Given 08/03/19 1443)  heparin ADULT infusion 100 units/mL (25000 units/236mL sodium  chloride 0.45%) (1,100 Units/hr Intravenous Rate/Dose Verify 08/03/19 1600)  amiodarone (NEXTERONE) 1.8 mg/mL load via infusion 150 mg (150 mg Intravenous Bolus from Bag 08/03/19 1524)    Followed by  amiodarone (NEXTERONE PREMIX) 360-4.14 MG/200ML-% (1.8 mg/mL) IV infusion (60 mg/hr Intravenous Rate/Dose  Verify 08/03/19 1600)    Followed by  amiodarone (NEXTERONE PREMIX) 360-4.14 MG/200ML-% (1.8 mg/mL) IV infusion (has no administration in time range)  magnesium sulfate IVPB 2 g 50 mL ( Intravenous Rate/Dose Verify 08/03/19 1600)  potassium chloride 10 mEq in 50 mL *CENTRAL LINE* IVPB ( Intravenous Rate/Dose Verify 08/03/19 1600)  etomidate (AMIDATE) injection (20 mg Intravenous Given 08/03/19 0942)  rocuronium (ZEMURON) injection (100 mg Intravenous Given 08/03/19 0942)  PHENYLephrine 40 mcg/ml in normal saline Adult IV Push Syringe (40 mcg Intravenous Given 08/03/19 1000)  aspirin suppository 300 mg (300 mg Rectal Given 08/03/19 1340)     Initial Impression / Assessment and Plan / ED Course  I have reviewed the triage vital signs and the nursing notes.  Pertinent labs & imaging results that were available during my care of the patient were reviewed by me and considered in my medical decision making (see chart for details).        Patient presents after a witnessed cardiac arrest 30 minutes prior to arrival.  She arrives tachycardic, with pulses intact, hypotensive.  Her Edison Pace airway was exchanged for a cuffed tube per the above procedure note.  A central line was placed in her femoral left groin per the above procedure note.  Labs, chest x-ray, CT head are ordered. Differential diagnosis includes cardiac arrest from PE, electrolyte abnormality, ACS, sepsis.   Fluids ordered in the emergency department to help hypotension.  Phenylephrine was given just post intubation, but further vasopressors were ordered, but not deemed necessary due to return of normotensive blood pressures.  Patient has not had any reaction, and has remained at a GCS of 3 despite no sedation while in the emergency department.  Handoff was given to ICU team.  Further history is obtained from patient's boyfriend, who states patient has been deconditioned from a fall 3 weeks ago that caused a left ankle and left wrist  injury.  She reportedly, over the last 1-1/2 weeks, had increased shortness of breath, but he denies any fevers, urinary symptoms, cough.  Denies any COVID-19 exposures.    Hypothermia protocol was initiated in the emergency department.  Cardiology is consulted.  Care of patient was discussed with the supervising attending.  Final Clinical Impressions(s) / ED Diagnoses   Final diagnoses:  Cardiac arrest [facility])  Elevated troponin    ED Discharge Orders    None       Julianne Rice, MD 08/03/19 1623    Dorie Rank, MD 08/04/19 [id number]    Dorie Rank, MD 08/17/19 1006

## 2019-08-03 NOTE — Code Documentation (Signed)
ED physician preparing to intubate

## 2019-08-03 NOTE — Consult Note (Addendum)
Cardiology Consultation:   Patient ID: Jaclyn Day; [id number]; September 05, 1946   Admit date: 08/03/2019 Date of Consult: 08/03/2019  Primary Care Provider: Cyndi Bender, PA-C Primary Cardiologist: Dr.Naelle Diegel Primary Electrophysiologist:    Patient Profile:   Jaclyn Day is a 73 y.o. female with a hx of A.fib on Eliquis, Diastolic heart failure, Htn, HLd, IDDM, CKD 4 w/ hx of nephrectomy who is being seen today for the evaluation of post-cardiac arrest at the request of Dr.Ramaswamy.  History of Present Illness:  Mrs.Man was examined and evaluated at bedside in ED. She was noted to be intubated and does not respond to painful or verbal stimuli.  History was obtained via conversation with Mr.[name] Stutts, Ms.Robling's SO, who was present for her arrest. He mentions over the last coupe days she has been having difficulty breathing but refused to come to the hospital as she did not want to get admitted. Yesterday night she fell and hit head but had no bleeding, headaches or further complications.  No further event occurred overnight and she woke up this morning without issues but she stayed in bed. Mr.Stutts went to pick up the papers and fed the cats and about 30 minutes after he went back to bed and checked on her and found that she had urinated in the bed. When he tried to turn her over to better evaluate, he noticed that 'her eyes got big,' her tongue was protruding and saw that she was 'trying to breath' but she could not breathe normally. Denies any seizure-like activity or tongue biting.  Per nursing staff, EMS found Mrs.Vallier unresponsive and initiated CPR. ROSC was achieved after 1 dose of epi and 10 minutes of CPR.   Per chart review, she was first diagnosed with A.fib on 01/2019 when she was admitted for acute on chronic diastolic heart failure. During the admission she underwent TEE + Cardioversion and was discharged on metoprolol and eliquis.   Past Medical History:   Diagnosis Date  . Anemia   . Chronic diastolic CHF (congestive heart failure) ([place])   . CKD (chronic kidney disease), stage IV ([place])   . Hypertension   . Persistent atrial fibrillation    a. dx 01/2019 s/p TEE DCCV.  Marland Kitchen Renal disorder   . Unilateral congenital absence of kidney    Past Surgical History:  Procedure Laterality Date  . CARDIOVERSION N/A 01/31/2019   Procedure: CARDIOVERSION;  Surgeon: Lelon Perla, MD;  Location: [facility] ENDOSCOPY;  Service: Cardiovascular;  Laterality: N/A;  . TEE WITHOUT CARDIOVERSION N/A 01/31/2019   Procedure: TRANSESOPHAGEAL ECHOCARDIOGRAM (TEE);  Surgeon: Lelon Perla, MD;  Location: [facility] ENDOSCOPY;  Service: Cardiovascular;  Laterality: N/A;    Home Medications:  Prior to Admission medications   Medication Sig Start Date End Date Taking? Authorizing Provider  amLODipine (NORVASC) 10 MG tablet Take 10 mg by mouth daily. 12/30/18   [provider]  apixaban (ELIQUIS) 5 MG TABS tablet Take 1 tablet (5 mg total) by mouth 2 (two) times daily. 01/31/19   Wilber Oliphant, MD  ARIPiprazole (ABILIFY) 10 MG tablet Take 5 mg by mouth 2 (two) times daily. 12/27/18   [provider]  atorvastatin (LIPITOR) 20 MG tablet Take 20 mg by mouth daily.    [provider]  brimonidine (ALPHAGAN) 0.15 % ophthalmic solution Place 1 drop into the left eye 2 (two) times daily.     [provider]  divalproex (DEPAKOTE) 500 MG DR tablet Take 1,000 mg by mouth at  bedtime. 12/11/18   [provider]  dorzolamide-timolol (COSOPT) 22.3-6.8 MG/ML ophthalmic solution Place 1 drop into both eyes 2 (two) times daily. 01/24/19   [provider]  DULoxetine (CYMBALTA) 60 MG capsule Take 60 mg by mouth daily. 01/01/19   [provider]  furosemide (LASIX) 40 MG tablet Take 1 tablet (40 mg total) by mouth daily. 02/01/19   Wilber Oliphant, MD  metoprolol succinate (TOPROL-XL) 50 MG 24 hr tablet Take 100 mg by mouth daily. 01/23/19    [provider]   Inpatient Medications: Scheduled Meds: . atorvastatin  20 mg Per NG tube Daily  . Chlorhexidine Gluconate Cloth  6 each Topical Daily  . fentaNYL (SUBLIMAZE) injection  50 mcg Intravenous Once  . insulin aspart  0-9 Units Subcutaneous Q4H  . midazolam  1 mg Intravenous Once  . pantoprazole (PROTONIX) IV  40 mg Intravenous QHS   Continuous Infusions: . sodium chloride    . sodium chloride 2.1 mL/hr at 08/03/19 1800  . sodium chloride 10 mL/hr at 08/03/19 1800  . amiodarone 60 mg/hr (08/03/19 1800)   Followed by  . amiodarone    . cefTRIAXone (ROCEPHIN)  IV Stopped (08/03/19 1409)  . fentaNYL infusion INTRAVENOUS    . heparin 1,100 Units/hr (08/03/19 1800)  . midazolam    . norepinephrine (LEVOPHED) Adult infusion     PRN Meds: fentaNYL, midazolam  Allergies:   No Known Allergies  Social History:   Social History   Socioeconomic History  . Marital status: Single    Spouse name: Not on file  . Number of children: Not on file  . Years of education: Not on file  . Highest education level: Not on file  Occupational History  . Not on file  Social Needs  . Financial resource strain: Not on file  . Food insecurity    Worry: Not on file    Inability: Not on file  . Transportation needs    Medical: Not on file    Non-medical: Not on file  Tobacco Use  . Smoking status: Never Smoker  . Smokeless tobacco: Never Used  Substance and Sexual Activity  . Alcohol use: Not Currently  . Drug use: Not Currently  . Sexual activity: Not on file  Lifestyle  . Physical activity    Days per week: Not on file    Minutes per session: Not on file  . Stress: Not on file  Relationships  . Social Herbalist on phone: Not on file    Gets together: Not on file    Attends religious service: Not on file    Active member of club or organization: Not on file    Attends meetings of clubs or organizations: Not on file    Relationship status: Not on file   . Intimate partner violence    Fear of current or ex partner: Not on file    Emotionally abused: Not on file    Physically abused: Not on file    Forced sexual activity: Not on file  Other Topics Concern  . Not on file  Social History Narrative   Lives with her boyfriend in [place]. He is her POA.     Family History:   Patient unable to provide  Family History  Problem Relation Age of Onset  . Atrial fibrillation Father   . Atrial fibrillation Brother     ROS:  Please see the history of present illness.  Review of Systems  Reason unable to perform ROS: Intubated.   All other ROS reviewed and negative.     Physical Exam/Data:   Vitals:   08/03/19 1715 08/03/19 1730 08/03/19 1745 08/03/19 1800  BP:    (!) 149/109  Pulse: 96   87  Resp: (!) 0 (!) 0 (!) 0 (!) 0  Temp:    (!) 97.3 F (36.3 C)  TempSrc:      SpO2: 94%   97%  Weight:      Height:        Intake/Output Summary (Last 24 hours) at 08/03/2019 1813 Last data filed at 08/03/2019 1800 Gross per 24 hour  Intake 421.37 ml  Output 15 ml  Net 406.37 ml   Filed Weights   08/03/19 0940  Weight: 87.5 kg   Body mass index is 31.14 kg/m.   Gen: Well-developed, obese, unresponsive HEENT: ETtube in place, pupils not reactive, no gag Neck: supple, ROM intact, no JVD CV: RRR, S1, S2 normal, No rubs, no murmurs, no gallops Pulm: Bibasilar rales Abd: Soft, BS+, Distended Extm: ROM intact, Peripheral pulses intact, Significant pitting edema Skin: Dry, Cool, Neuro: Unresponsive  EKG:  The EKG was personally reviewed and demonstrates:  A.fib w/ RVR. Worsening ST depression anteriolateral leads, RBBB  Telemetry:  Telemetry was personally reviewed and demonstrates:  A.fib  Relevant CV Studies:  TEE 08/03/19 IMPRESSIONS  1. The left ventricle has normal systolic function, with an ejection fraction of 55-60%. No evidence of left ventricular regional wall motion abnormalities.  2. The right ventricle has normal  systolc function. The cavity was normal.  3. Left atrial size was mildly dilated.  4. The mitral valve is grossly normal. Mild thickening of the mitral valve leaflet.  5. The tricuspid valve was grossly normal.  6. The aortic valve is tricuspid Mild thickening of the aortic valve. No stenosis of the aortic valve.  7. There is evidence of mild plaque in the descending aorta.  8. Normal LV function; mild LAE; no LAA thrombus; mild MR and TR.  Laboratory Data:  Chemistry Recent Labs  Lab 08/03/19 1013 08/03/19 1058  NA 142 142  K 3.7 3.2*  CL 111  --   CO2 18*  --   GLUCOSE 145*  --   BUN 37*  --   CREATININE 2.64*  --   CALCIUM 8.2*  --   GFRNONAA 17*  --   GFRAA 20*  --   ANIONGAP 13  --     Recent Labs  Lab 08/03/19 1013  PROT 4.8*  ALBUMIN 2.3*  AST 26  ALT 20  ALKPHOS 94  BILITOT 1.2   Hematology Recent Labs  Lab 08/03/19 1058 08/03/19 1147  WBC  --  32.1*  RBC  --  3.87  HGB 11.6* 11.4*  HCT 34.0* 38.0  MCV  --  98.2  MCH  --  29.5  MCHC  --  30.0  RDW  --  16.3*  PLT  --  278   Cardiac EnzymesNo results for input(s): TROPONINI in the last 168 hours. No results for input(s): TROPIPOC in the last 168 hours.  BNPNo results for input(s): BNP, PROBNP in the last 168 hours.  DDimer  Recent Labs  Lab 08/03/19 1239  DDIMER 11.22*   Radiology/Studies:  Ct Head Wo Contrast  Result Date: 08/03/2019 CLINICAL DATA:  Found unresponsive EXAM: CT HEAD WITHOUT CONTRAST TECHNIQUE: Contiguous axial images were obtained from the base of the skull through the vertex without intravenous contrast. COMPARISON:  None. FINDINGS: Brain: Mild chronic small vessel disease throughout the deep white matter. Mild cerebral atrophy. No acute intracranial abnormality. Specifically, no hemorrhage, hydrocephalus, mass lesion, acute infarction, or significant intracranial injury. Vascular: No hyperdense vessel or unexpected calcification. Skull: No acute calvarial abnormality.  Sinuses/Orbits: Visualized paranasal sinuses and mastoids clear. Orbital soft tissues unremarkable. Other: None IMPRESSION: Atrophy, chronic microvascular disease. No acute intracranial abnormality. Electronically Signed   By: Rolm Baptise M.D.   On: 08/03/2019 13:37   Dg Chest Portable 1 View  Result Date: 08/03/2019 CLINICAL DATA:  ET tube placement EXAM: PORTABLE CHEST 1 VIEW COMPARISON:  01/25/2019 FINDINGS: Endotracheal tube is 2 cm above the carina. NG tube is in the stomach. Cardiomegaly. Bilateral perihilar and lower lobe opacities, right greater than left could reflect edema or infection. No visible effusions or acute bony abnormality. IMPRESSION: Endotracheal tube 2 cm above the carina. Cardiomegaly. Mild perihilar and lower lobe opacities, right greater than left could reflect edema or infection. Electronically Signed   By: Rolm Baptise M.D.   On: 08/03/2019 10:48    Assessment and Plan:   1. Post Arrest-Care Presented to [facility] after arrest - ROSC after 10 minutes, 1 dose of Epi. No record of presenting rhythm. Per history, appears to be respiratory arrest. Progressive dyspnea prior. Known HFpEF w/ Lasix at home. Hypervolemic on exam. RLL opacity on chest X ray. EKG w/ ST depressions on anterior leads but present to a degree on prior EKG. HsTrop 48->715. D-dimer 11.22. Procalcitonin 0.88 Wbc 32.1 w/ left shift. Lactate 4.3. CT head w/o acute findings. Elevated troponin likely due to demand in setting of arrest - Working up resp etiology per PCCM (resp viral panel, legionella, procalc) - C/w targeted temperature management - Trend troponin to plateau - Levophed as needed to keep MAP >65 - C/w ceftriaxone - F/u TTE - F/u EEG - Will need LHC but with CKD4 will need to have goals of care discussion prior regarding likelihood of progression to dialysis - Will need EP eval for ICD  2. Atrial Fibrillation Prior hx of A.fib converted to sinus after cardioversion. Current rhythm A.fib w/ HR  ~100-120s. Not requiring pressors. Hypertensive at 160/120s.  - C/w heparin gtt - Start amiodarone gtt - Monitor/replete K/Mag as appropriate - C/w telemetry  3. Acute on diastolic heart failure Hypervolemic on exam. On furosemide 40mg  at home. Hypervolemic on exam. Renal fx at baseline. Per ED, had brief episode of hypotension during intubation. Currently hypertensive. - Daily weights, I&Os - C/w telemetry  4. Chronic Kidney Disease stage 4 Hx of L nephrectomy Baseline creatinine 2.7. Admit creatinine 2.64, Bun 37. At baseline. Hx of L nephrectomy.  - Trend renal fx - Avoid nephrotoxic meds when able  For questions or updates, please contact [place] Please consult [email or web address] for contact info under Cardiology/STEMI.   Signed, Gilberto Better, MD PGY-2, [facility] Pager: [phone] 08/03/2019 6:13 PM  Attending attestation to follow  The patient was seen, examined and discussed with the resident Mosetta Anis, MD   and I agree with the above.   73 y.o. female with a hx of A.fib on Eliquis, Diastolic heart failure, Htn, HLd, IDDM, CKD 4 w/ hx of nephrectomy who is being seen today for the evaluation of post-cardiac arrest.  The patient was seen back in April when she was admitted for acute on chronic diastolic heart failure and A. fib with RVR, she was cardioverted and discharged but never followed up in the clinic. The patient  developed shortness of breath in the last 3 weeks, that have progressed, she did not have any chest pain, she arrested in front of her boyfriend she had some urinary incontinence, he called EMS, that arrived within 10 minutes and were able to revive her after 10 minutes of CPR with use of 1 dose of epinephrine.  She was intubated sedated, and is being cooled down.  Her initial rhythm was unknown, on admission she was in atrial fibrillation with RVR and diffuse ST depressions that are new from prior EKG in April when she was in sinus rhythm with no ST  depressions. Due to her bedside echocardiogram her LVEF is severely diffusely decreased estimated at 15 to 20%, previously normal LVEF in April 2020.  Plan would be to provide supportive care, will start amiodarone drip for atrial fibrillation with RVR, will follow patient's clinical status with regards to cognitive recovery, and then arrange for ischemic work-up if she recovers.  Her prognosis seems poor given high level of lactic acid and degree of LV dysfunction and underlying kidney failure.  Even if she recovers neurologically her limiting factor will be stage IV kidney failure with only 1 kidney, discussion will have to be made that she potentially can end up on dialysis if we perform cardiac cath.  Ena Dawley, MD 08/03/2019

## 2019-08-03 NOTE — ED Notes (Signed)
Returned from [place] , arctic sun started at [employer]

## 2019-08-03 NOTE — Progress Notes (Signed)
Echocardiogram 2D Echocardiogram has been performed.  Oneal Deputy Tavaras Goody 08/03/2019, 3:15 PM

## 2019-08-03 NOTE — Progress Notes (Signed)
RT NOTE: RT obtained ABG: pH 7.31, CO2 36.5, PO2 408 and HCO3 18.6. No new orders at this time. RT will continue to monitor.

## 2019-08-03 NOTE — Progress Notes (Signed)
CCM and cardiology made aware of patient's hypertension. Order to decrease maintenance fluid received; no other orders at this time. Will continue to monitor.

## 2019-08-03 NOTE — H&P (Signed)
NAME:  Jaclyn Day, MRN:  [id number], DOB:  05-09-1946, LOS: 0 ADMISSION DATE:  08/03/2019, CONSULTATION DATE:  10/22 REFERRING MD:  Dr. Sherril Croon, CHIEF COMPLAINT:  Post arrest    Brief History   73yo female admitted after oiut of hospital cardiac arrest, unclear etiology CPR started by fire 10 min CPR with 1 Epi before ROSC. PCCM called for admission and need for targeted temperature management and ventilator management.   History of present illness   73 yo female presented post arrest. Found down by family with LKN 30 mins prior. CPR started by fire, required 10 mins of CPR and 1 round of Epi before ROSC. Unable to obtain history from patient given unresponsiveness, no family currently at bedside.   On chart review it appear patient was admitted at this facility 01/25/2019-01/31/2019 for new onset A-fib with development of diastolic congestive heart failure, during admission she underwent TEE and cardioversion with transition to SR.   Patient was intubated on arrival with transit hypotension post intubation requiring short course of pressors. Vitals at time of assessment stable.  Lab work significant for elevated BUN and creatinine consistent with history of CKD stage IV, lactic acid 4.3, high sensitivity troponin 48. CBC currently pending. Chest xray with cardiomegaly. Head CT currently pending. COVID testing currently pending   Past Medical History  Congenital unilateral kidney  CKD stage IV (baseline creatinine 2.2-2.8) Hypertension  Persistent A-fib on anticoagulation  Chronic diastolic CHF Anemia  Cardioversion and TEE 01/31/2019  Significant Hospital Events   10/22 Admitted post arrest, intubated, initiation of targeted temperature management   Consults:  PCCM  Procedures:  ETT 10/22 >> Right femoral CVC 10/22 >>  Significant Diagnostic Tests:    Micro Data:  Blood culture 10/22 >> COVID 10/22 >>   Antimicrobials:    Interim history/subjective:  Lying on ED  stretcher unresponsive. No family currently at bedside.   Objective   Blood pressure 108/78, pulse (!) 121, temperature (!) 97.5 F (36.4 C), temperature source Rectal, resp. rate 20, height 5\' 6"  (1.676 m), weight 87.5 kg, SpO2 100 %.    Vent Mode: PRVC FiO2 (%):  [60 %-100 %] 60 % Set Rate:  [20 bmp] 20 bmp Vt Set:  [470 mL] 470 mL PEEP:  [5 cmH20] 5 cmH20 Plateau Pressure:  [17 cmH20] 17 cmH20  No intake or output data in the 24 hours ending 08/03/19 1142 Filed Weights   08/03/19 0940  Weight: 87.5 kg   Examination: General: Elderly, chronically and acutely ill appearing, obese HENT: Normocephalic, Pupils minimally reactive 74mm b/l. Moist mucus membranes Neck: No JVD. Trachea midline. No thyromegaly, no lymphadenopathy CV: IRRR. S1S2. No MRG. +2 distal pulses Lungs: BBS present, mild upper lobe rhonchi, FNL, symmetrical. Vent supported  ABD: hypoactive +BS x4. SNT/ND. No masses, guarding or rigidity GU: No Foley EXT: LUE healing skin tear, LEU also with old bruising, old bruising noted b/l knees and shin area. LLE IO in place  Skin: Pale, warm dry. See EXT. No anterior surface rashes or lesions Neuro: unresponsive to any stimuli. Has received chemical paralysis and sedation for intubation   Resolved Hospital Problem list     Assessment & Plan:  This is a 73 yo F being admitted to ICU for  witnessed out of hospital cardiac arrest, unclear etiology, with field ROSC, resultant respiratory failure and encephalopathy. EKG shows anterolateral ST depression.   Post cardiac arrest  Plan TTM protocol to [facility] with sedation and analgesia per protocol  Order  Echo-s/p TEE cardioversion in 01/2019 with EF 55-60%  Cardiology to eval On chronic AC for AF-consult pharmacy for IV heparin. CT head pending d/t cardiac arrest and unresponsive prior to start of same  Serial troponin  IVF Pressors prn to maintain Goal MAP 65 and end organ perfusion Ventilator support to prevent eminent  deterioration and further organ dysfunction from hypoxemia and hypercarbia.   Patient is at risk for sudden hypoxia, barotrauma and hemodynamic compromise.   Maintain SpO2 greater than or equal to 90%. Head of bed elevated 30 degrees. Plateau pressures less than 30 cm H20.  Follow chest x-ray, ABG.   SAT/SBT as tolerated. Bronchial hygiene. RT/bronchodilator protocol. Serial lactic acid  CBC/WBC/UA/procal pending. Will f/u to address abx. No overt infiltrate on CXR. Low concern for PE as she is on chronic AC but can be part of DDx with h/o AF and cardiac arrest.  UTO CTA d/t CKD.  EEG   CKD III -Appears at baseline Plan Follow with renal panel  Avoid nephrotoxins     Best practice:  Diet: NPO. TF starting post rewarming  Pain/Anxiety/Delirium protocol (if indicated): Y VAP protocol (if indicated): Y DVT prophylaxis: Heparin gtt  GI prophylaxis: PPI  Glucose control: SSI Q4H  Mobility: Bed rest  Code Status: FULL  Family Communication: pending  Disposition: Admit ICU   Labs   CBC: Recent Labs  Lab 08/03/19 1058  HGB 11.6*  HCT 34.0*    Basic Metabolic Panel: Recent Labs  Lab 08/03/19 1013 08/03/19 1058  NA 142 142  K 3.7 3.2*  CL 111  --   CO2 18*  --   GLUCOSE 145*  --   BUN 37*  --   CREATININE 2.64*  --   CALCIUM 8.2*  --   MG 1.8  --    GFR: Estimated Creatinine Clearance: 21.5 mL/min (A) (by C-G formula based on SCr of 2.64 mg/dL (H)). Recent Labs  Lab 08/03/19 1013  LATICACIDVEN 4.3*    Liver Function Tests: Recent Labs  Lab 08/03/19 1013  AST 26  ALT 20  ALKPHOS 94  BILITOT 1.2  PROT 4.8*  ALBUMIN 2.3*   Recent Labs  Lab 08/03/19 1013  LIPASE 24   No results for input(s): AMMONIA in the last 168 hours.  ABG    Component Value Date/Time   PHART 7.315 (L) 08/03/2019 1058   PCO2ART 36.5 08/03/2019 1058   PO2ART 408.0 (H) 08/03/2019 1058   HCO3 18.6 (L) 08/03/2019 1058   TCO2 20 (L) 08/03/2019 1058   ACIDBASEDEF 7.0 (H)  08/03/2019 1058   O2SAT 100.0 08/03/2019 1058     Coagulation Profile: No results for input(s): INR, PROTIME in the last 168 hours.  Cardiac Enzymes: No results for input(s): CKTOTAL, CKMB, CKMBINDEX, TROPONINI in the last 168 hours.  HbA1C: Hgb A1c MFr Bld  Date/Time Value Ref Range Status  01/26/2019 04:19 AM 5.2 4.8 - 5.6 % Final    Comment:    (NOTE) Pre diabetes:          5.7%-6.4% Diabetes:              >6.4% Glycemic control for   <7.0% adults with diabetes     CBG: Recent Labs  Lab 08/03/19 0959  GLUCAP 113*    Review of Systems:   Unable to obtain secondary to unresponsiveness on full vent support   Past Medical History  She,  has a past medical history of Anemia, Chronic diastolic CHF (congestive heart failure) ([place]),  CKD (chronic kidney disease), stage IV ([place]), Hypertension, Persistent atrial fibrillation, Renal disorder, and Unilateral congenital absence of kidney.   Surgical History    Past Surgical History:  Procedure Laterality Date  . CARDIOVERSION N/A 01/31/2019   Procedure: CARDIOVERSION;  Surgeon: Lelon Perla, MD;  Location: [facility] ENDOSCOPY;  Service: Cardiovascular;  Laterality: N/A;  . TEE WITHOUT CARDIOVERSION N/A 01/31/2019   Procedure: TRANSESOPHAGEAL ECHOCARDIOGRAM (TEE);  Surgeon: Lelon Perla, MD;  Location: [facility] ENDOSCOPY;  Service: Cardiovascular;  Laterality: N/A;     Social History   reports that she has never smoked. She has never used smokeless tobacco. She reports previous alcohol use. She reports previous drug use.   Family History   Her family history includes Atrial fibrillation in her brother and father.   Allergies No Known Allergies   Home Medications  Prior to Admission medications   Medication Sig Start Date End Date Taking? Authorizing Provider  amLODipine (NORVASC) 10 MG tablet Take 10 mg by mouth daily. 12/30/18   [provider]  apixaban (ELIQUIS) 5 MG TABS tablet Take 1 tablet (5 mg total) by mouth  2 (two) times daily. 01/31/19   Wilber Oliphant, MD  ARIPiprazole (ABILIFY) 10 MG tablet Take 5 mg by mouth 2 (two) times daily. 12/27/18   [provider]  atorvastatin (LIPITOR) 20 MG tablet Take 20 mg by mouth daily.    [provider]  brimonidine (ALPHAGAN) 0.15 % ophthalmic solution Place 1 drop into the left eye 2 (two) times daily.     [provider]  divalproex (DEPAKOTE) 500 MG DR tablet Take 1,000 mg by mouth at bedtime. 12/11/18   [provider]  dorzolamide-timolol (COSOPT) 22.3-6.8 MG/ML ophthalmic solution Place 1 drop into both eyes 2 (two) times daily. 01/24/19   [provider]  DULoxetine (CYMBALTA) 60 MG capsule Take 60 mg by mouth daily. 01/01/19   [provider]  furosemide (LASIX) 40 MG tablet Take 1 tablet (40 mg total) by mouth daily. 02/01/19   Wilber Oliphant, MD  metoprolol succinate (TOPROL-XL) 50 MG 24 hr tablet Take 100 mg by mouth daily. 01/23/19   [provider]     Critical care time:     CRITICAL CARE Performed by: Johnsie Cancel   Total critical care time: 50 minutes  Critical care time was exclusive of separately billable procedures and treating other patients.  Critical care was necessary to treat or prevent imminent or life-threatening deterioration.  Critical care was time spent personally by me on the following activities: development of treatment plan with patient and/or surrogate as well as nursing, discussions with consultants, evaluation of patient's response to treatment, examination of patient, obtaining history from patient or surrogate, ordering and performing treatments and interventions, ordering and review of laboratory studies, ordering and review of radiographic studies, pulse oximetry and re-evaluation of patient's condition.   Johnsie Cancel, NP-C [facility] Contact information can be found on Amion  After hours pager: [phone]. 08/03/2019, 12:19 PM

## 2019-08-03 NOTE — Progress Notes (Signed)
EEG completed, results pending. 

## 2019-08-03 NOTE — Progress Notes (Signed)
RT NOTE: RT transported patient to CT and back with RN. No complications and VS stable. RT will continue to monitor.

## 2019-08-03 NOTE — Procedures (Signed)
Patient Name: KEERTANA BULLS  MRN: [id number]  Epilepsy Attending: Lora Havens  Referring Physician/Provider: Francine Graven, NP Date: 08/03/2019  Duration: 23.47 mins  Patient history: 73yo F s/p cardiac arrest. EEG to evaluate for seizure  Level of alertness: comatose  AEDs during EEG study: versed  Technical aspects: This EEG study was done with scalp electrodes positioned according to the 10-20 International system of electrode placement. Electrical activity was acquired at a sampling rate of 500Hz  and reviewed with a high frequency filter of 70Hz  and a low frequency filter of 1Hz . EEG data were recorded continuously and digitally stored.   DESCRIPTION: EEG showed continuous slow 2-3Hz  delta slowing admixed with an excessive amount of 15 to 18 Hz, 2-3 uV beta activity  distributed symmetrically and diffusely.  EEG is reactive to noxious stimuli.   Hyperventilation and photic stimulation were not performed.  ABNORMALITY - Continuous slow, generalized - Excessive beta, generalized  IMPRESSION: This study is suggestive of severe diffuse encephalopathy, non specific to etiology. No seizures or epileptiform discharges were seen throughout the recording.  The excessive beta activity seen in the background is most likely due to the effect of benzodiazepine.  Juel Bellerose Barbra Sarks

## 2019-08-03 NOTE — Progress Notes (Signed)
ANTICOAGULATION CONSULT NOTE - Initial Consult  Pharmacy Consult for Heparin Indication: atrial fibrillation  No Known Allergies  Patient Measurements: Height: 5\' 6"  (167.6 cm) Weight: 192 lb 14.4 oz (87.5 kg) IBW/kg (Calculated) : 59.3 Heparin Dosing Weight: 78 kg  Vital Signs: Temp: 96.1 F (35.6 C) (10/22 1315) Temp Source: Rectal (10/22 1041) BP: 150/125 (10/22 1400) Pulse Rate: 45 (10/22 1300)  Labs: Recent Labs    08/03/19 1013 08/03/19 1058 08/03/19 1147 08/03/19 1239  HGB  --  11.6* 11.4*  --   HCT  --  34.0* 38.0  --   PLT  --   --  278  --   APTT  --   --  23*  --   LABPROT  --   --  16.9*  --   INR  --   --  1.4*  --   HEPARINUNFRC  --   --   --  1.22*  CREATININE 2.64*  --   --   --   TROPONINIHS 48*  --  715*  --     Estimated Creatinine Clearance: 21.5 mL/min (A) (by C-G formula based on SCr of 2.64 mg/dL (H)).   Medical History: Past Medical History:  Diagnosis Date  . Anemia   . Chronic diastolic CHF (congestive heart failure) ([place])   . CKD (chronic kidney disease), stage IV ([place])   . Hypertension   . Persistent atrial fibrillation    a. dx 01/2019 s/p TEE DCCV.  Marland Kitchen Renal disorder   . Unilateral congenital absence of kidney     Medications:  Scheduled:  . atorvastatin  20 mg Per NG tube Daily  . Chlorhexidine Gluconate Cloth  6 each Topical Daily  . fentaNYL (SUBLIMAZE) injection  50 mcg Intravenous Once  . insulin aspart  0-9 Units Subcutaneous Q4H  . midazolam  1 mg Intravenous Once  . pantoprazole (PROTONIX) IV  40 mg Intravenous QHS   Infusions:  . sodium chloride    . sodium chloride 125 mL/hr at 08/03/19 1142  . sodium chloride    . cefTRIAXone (ROCEPHIN)  IV 2 g (08/03/19 1339)  . fentaNYL infusion INTRAVENOUS    . midazolam    . norepinephrine (LEVOPHED) Adult infusion      Assessment: Pt is a 73 y/o female admitted s/p out of hospital cardiac arrest. ROSC was achieved and targeted temperature management was initiated. Pt  has a history of A-fib (taking eliquis PTA), diastolic heart failure, HTN, HDL, IDDM, CKD. Pharmacy has been consulted to dose heparin for A-fib.  Baseline aPTT is 23 and heparin level is 1.22. Patient was on Eliquis PTA but last dose is unknown. INR is 1.4. D-dimer is elevated at 11.22, troponins are elevated at 715.  Goal of Therapy:  Heparin level 0.3-0.7 units/ml aPTT 66-102 seconds Monitor platelets by anticoagulation protocol: Yes   Plan:  Start heparin infusion at 1100 units/hr Check aPTT level in 8 hours and daily while on heparin Check aptt and heparin level daily until both corollate Monitor CBC daily Continue to monitor H&H and platelets  Sherren Kerns, PharmD PGY1 [place] Resident 08/03/2019,2:09 PM

## 2019-08-03 NOTE — Progress Notes (Signed)
eLink Physician-Brief Progress Note Patient Name: [name] DOB: 08-07-1946 MRN: [id number]   Date of Service  08/03/2019  HPI/Events of Note  Pt with severe cardiomyopathy with systolic heart failure s/p cardiac arrest with successful resuscitation now receiving TTM. Blood pressure was soft earlier.  eICU Interventions  RN instructed to start low dose Norepinephrine per recommendation from cardiology note. Pt has had a good response to starting Norepinephrine with normalization of BP without tachycardia.        Kerry Kass Ogan 08/03/2019, 11:22 PM

## 2019-08-03 NOTE — Procedures (Signed)
Arterial Catheter Insertion Procedure Note Jaclyn Day [id number] 06/15/1946  Procedure: Insertion of Arterial Catheter  Indications: Blood pressure monitoring and Frequent blood sampling  Procedure Details Consent: Unable to obtain consent because of emergent medical necessity. Time Out: Verified patient identification, verified procedure, site/side was marked, verified correct patient position, special equipment/implants available, medications/allergies/relevent history reviewed, required imaging and test results available.  Performed  Maximum sterile technique was used including antiseptics, cap, gloves, gown, hand hygiene, mask and sheet. Skin prep: Chlorhexidine; local anesthetic administered 22 gauge catheter was inserted into right radial artery using the Seldinger technique. ULTRASOUND GUIDANCE USED: NO Evaluation Blood flow good; BP tracing good. Complications: No apparent complications.   Renato Gails [facility] 08/03/2019

## 2019-08-04 ENCOUNTER — Inpatient Hospital Stay (HOSPITAL_COMMUNITY): Payer: 59

## 2019-08-04 DIAGNOSIS — A419 Sepsis, unspecified organism: Secondary | ICD-10-CM

## 2019-08-04 DIAGNOSIS — N184 Chronic kidney disease, stage 4 (severe): Secondary | ICD-10-CM

## 2019-08-04 DIAGNOSIS — R6520 Severe sepsis without septic shock: Secondary | ICD-10-CM

## 2019-08-04 DIAGNOSIS — N171 Acute kidney failure with acute cortical necrosis: Secondary | ICD-10-CM | POA: Diagnosis not present

## 2019-08-04 DIAGNOSIS — I5041 Acute combined systolic (congestive) and diastolic (congestive) heart failure: Secondary | ICD-10-CM | POA: Diagnosis not present

## 2019-08-04 DIAGNOSIS — J9600 Acute respiratory failure, unspecified whether with hypoxia or hypercapnia: Secondary | ICD-10-CM

## 2019-08-04 DIAGNOSIS — I469 Cardiac arrest, cause unspecified: Secondary | ICD-10-CM | POA: Diagnosis not present

## 2019-08-04 DIAGNOSIS — I4891 Unspecified atrial fibrillation: Secondary | ICD-10-CM | POA: Diagnosis not present

## 2019-08-04 LAB — TROPONIN I (HIGH SENSITIVITY): Troponin I (High Sensitivity): 3543 ng/L (ref <18)

## 2019-08-04 LAB — APTT
aPTT: 122 seconds — ABNORMAL HIGH (ref 24–36)
aPTT: 89 seconds — ABNORMAL HIGH (ref 24–36)

## 2019-08-04 LAB — BASIC METABOLIC PANEL
Anion gap: 14 (ref 5–15)
Anion gap: 14 (ref 5–15)
Anion gap: 16 — ABNORMAL HIGH (ref 5–15)
BUN: 43 mg/dL — ABNORMAL HIGH (ref 8–23)
BUN: 45 mg/dL — ABNORMAL HIGH (ref 8–23)
BUN: 48 mg/dL — ABNORMAL HIGH (ref 8–23)
CO2: 15 mmol/L — ABNORMAL LOW (ref 22–32)
CO2: 17 mmol/L — ABNORMAL LOW (ref 22–32)
CO2: 15 mmol/L — ABNORMAL LOW (ref 22–32)
Calcium: 8 mg/dL — ABNORMAL LOW (ref 8.9–10.3)
Calcium: 8.3 mg/dL — ABNORMAL LOW (ref 8.9–10.3)
Calcium: 8.2 mg/dL — ABNORMAL LOW (ref 8.9–10.3)
Chloride: 109 mmol/L (ref 98–111)
Chloride: 115 mmol/L — ABNORMAL HIGH (ref 98–111)
Chloride: 108 mmol/L (ref 98–111)
Creatinine, Ser: 2.79 mg/dL — ABNORMAL HIGH (ref 0.44–1.00)
Creatinine, Ser: 3.16 mg/dL — ABNORMAL HIGH (ref 0.44–1.00)
Creatinine, Ser: 3.35 mg/dL — ABNORMAL HIGH (ref 0.44–1.00)
GFR calc Af Amer: 19 mL/min — ABNORMAL LOW (ref >60)
GFR calc Af Amer: 16 mL/min — ABNORMAL LOW (ref >60)
GFR calc Af Amer: 15 mL/min — ABNORMAL LOW (ref >60)
GFR calc non Af Amer: 16 mL/min — ABNORMAL LOW (ref >60)
GFR calc non Af Amer: 14 mL/min — ABNORMAL LOW (ref >60)
GFR calc non Af Amer: 13 mL/min — ABNORMAL LOW (ref >60)
Glucose, Bld: 116 mg/dL — ABNORMAL HIGH (ref 70–99)
Glucose, Bld: 105 mg/dL — ABNORMAL HIGH (ref 70–99)
Glucose, Bld: 112 mg/dL — ABNORMAL HIGH (ref 70–99)
Potassium: 3.7 mmol/L (ref 3.5–5.1)
Potassium: 3.9 mmol/L (ref 3.5–5.1)
Potassium: 4 mmol/L (ref 3.5–5.1)
Sodium: 138 mmol/L (ref 135–145)
Sodium: 146 mmol/L — ABNORMAL HIGH (ref 135–145)
Sodium: 139 mmol/L (ref 135–145)

## 2019-08-04 LAB — POCT I-STAT 7, (LYTES, BLD GAS, ICA,H+H)
Acid-base deficit: 8 mmol/L — ABNORMAL HIGH (ref 0.0–2.0)
Acid-base deficit: 8 mmol/L — ABNORMAL HIGH (ref 0.0–2.0)
Bicarbonate: 17.3 mmol/L — ABNORMAL LOW (ref 20.0–28.0)
Bicarbonate: 16.9 mmol/L — ABNORMAL LOW (ref 20.0–28.0)
Calcium, Ion: 1.17 mmol/L (ref 1.15–1.40)
Calcium, Ion: 1.16 mmol/L (ref 1.15–1.40)
HCT: 38 % (ref 36.0–46.0)
HCT: 36 % (ref 36.0–46.0)
Hemoglobin: 12.9 g/dL (ref 12.0–15.0)
Hemoglobin: 12.2 g/dL (ref 12.0–15.0)
O2 Saturation: 100 %
O2 Saturation: 99 %
Patient temperature: 35.4
Patient temperature: 97.9
Potassium: 3.7 mmol/L (ref 3.5–5.1)
Potassium: 3.6 mmol/L (ref 3.5–5.1)
Sodium: 140 mmol/L (ref 135–145)
Sodium: 139 mmol/L (ref 135–145)
TCO2: 18 mmol/L — ABNORMAL LOW (ref 22–32)
TCO2: 18 mmol/L — ABNORMAL LOW (ref 22–32)
pCO2 arterial: 32.1 mmHg (ref 32.0–48.0)
pCO2 arterial: 30.9 mmHg — ABNORMAL LOW (ref 32.0–48.0)
pH, Arterial: 7.331 — ABNORMAL LOW (ref 7.350–7.450)
pH, Arterial: 7.344 — ABNORMAL LOW (ref 7.350–7.450)
pO2, Arterial: 186 mmHg — ABNORMAL HIGH (ref 83.0–108.0)
pO2, Arterial: 159 mmHg — ABNORMAL HIGH (ref 83.0–108.0)

## 2019-08-04 LAB — CBC
HCT: 39.3 % (ref 36.0–46.0)
Hemoglobin: 11.9 g/dL — ABNORMAL LOW (ref 12.0–15.0)
MCH: 29.6 pg (ref 26.0–34.0)
MCHC: 30.3 g/dL (ref 30.0–36.0)
MCV: 97.8 fL (ref 80.0–100.0)
Platelets: 230 10*3/uL (ref 150–400)
RBC: 4.02 MIL/uL (ref 3.87–5.11)
RDW: 16.5 % — ABNORMAL HIGH (ref 11.5–15.5)
WBC: 36.2 10*3/uL — ABNORMAL HIGH (ref 4.0–10.5)
nRBC: 0 % (ref 0.0–0.2)

## 2019-08-04 LAB — PROCALCITONIN: Procalcitonin: 6.46 ng/mL

## 2019-08-04 LAB — GLUCOSE, CAPILLARY
Glucose-Capillary: 91 mg/dL (ref 70–99)
Glucose-Capillary: 71 mg/dL (ref 70–99)
Glucose-Capillary: 78 mg/dL (ref 70–99)
Glucose-Capillary: 65 mg/dL — ABNORMAL LOW (ref 70–99)
Glucose-Capillary: 91 mg/dL (ref 70–99)

## 2019-08-04 LAB — HEPARIN LEVEL (UNFRACTIONATED): Heparin Unfractionated: 1.14 IU/mL — ABNORMAL HIGH (ref 0.30–0.70)

## 2019-08-04 LAB — LEGIONELLA PNEUMOPHILA SEROGP 1 UR AG: L. pneumophila Serogp 1 Ur Ag: NEGATIVE

## 2019-08-04 LAB — PHOSPHORUS: Phosphorus: 4.5 mg/dL (ref 2.5–4.6)

## 2019-08-04 LAB — VITAMIN D 25 HYDROXY (VIT D DEFICIENCY, FRACTURES): Vit D, 25-Hydroxy: 12.34 ng/mL — ABNORMAL LOW (ref 30–100)

## 2019-08-04 LAB — MAGNESIUM: Magnesium: 2.2 mg/dL (ref 1.7–2.4)

## 2019-08-04 MED ORDER — SODIUM CHLORIDE 0.9 % IV SOLN
250.0000 mL | INTRAVENOUS | Status: DC
  Administered 2019-08-05 – 2019-08-13 (×2): 250 mL via INTRAVENOUS
  Administered 2019-08-20: 10 mL via INTRAVENOUS

## 2019-08-04 MED ORDER — SODIUM CHLORIDE 0.9% FLUSH
10.0000 mL | Freq: Two times a day (BID) | INTRAVENOUS | Status: DC
  Administered 2019-08-04 – 2019-08-17 (×17): 10 mL
  Administered 2019-08-17: 22:00:00 20 mL
  Administered 2019-08-18 – 2019-09-17 (×35): 10 mL

## 2019-08-04 MED ORDER — SODIUM CHLORIDE 0.9% FLUSH
10.0000 mL | INTRAVENOUS | Status: DC | PRN
  Administered 2019-08-23: 10:00:00 10 mL
  Filled 2019-08-04: qty 40

## 2019-08-04 MED ORDER — PHENYLEPHRINE HCL-NACL 10-0.9 MG/250ML-% IV SOLN
25.0000 ug/min | INTRAVENOUS | Status: DC
  Administered 2019-08-05: 20 ug/min via INTRAVENOUS
  Administered 2019-08-05: 25 ug/min via INTRAVENOUS
  Administered 2019-08-05: 20 ug/min via INTRAVENOUS
  Filled 2019-08-04 (×3): qty 250

## 2019-08-04 MED ORDER — SODIUM CHLORIDE 0.9 % IV BOLUS
500.0000 mL | Freq: Once | INTRAVENOUS | Status: AC
  Administered 2019-08-04: 500 mL via INTRAVENOUS

## 2019-08-04 MED ORDER — POTASSIUM CHLORIDE 10 MEQ/50ML IV SOLN
10.0000 meq | INTRAVENOUS | Status: AC
  Administered 2019-08-04: 10 meq via INTRAVENOUS
  Filled 2019-08-04 (×2): qty 50

## 2019-08-04 MED ORDER — FENTANYL CITRATE (PF) 100 MCG/2ML IJ SOLN
25.0000 ug | Freq: Once | INTRAMUSCULAR | Status: AC | PRN
  Administered 2019-08-05: 25 ug via INTRAVENOUS
  Filled 2019-08-04: qty 2

## 2019-08-04 MED ORDER — CHLORHEXIDINE GLUCONATE 0.12% ORAL RINSE (MEDLINE KIT)
15.0000 mL | Freq: Two times a day (BID) | OROMUCOSAL | Status: DC
  Administered 2019-08-04 – 2019-08-13 (×19): 15 mL via OROMUCOSAL

## 2019-08-04 MED ORDER — ORAL CARE MOUTH RINSE
15.0000 mL | OROMUCOSAL | Status: DC
  Administered 2019-08-04 – 2019-08-14 (×92): 15 mL via OROMUCOSAL

## 2019-08-04 NOTE — Progress Notes (Signed)
eLink Physician-Brief Progress Note Patient Name: Jaclyn Day DOB: 02-07-1946 MRN: [id number]   Date of Service  08/04/2019  HPI/Events of Note  Notified of agitation requiring fentanyl bolus with subsequent hypotension. Increasing norepinephrine aggravated tachycardia.  eICU Interventions   Ordered to switch to phenylephrine  Bedside RN suggested to try Fentanyl push instead of bolus, ordered one time dose        Jaclyn Day 08/04/2019, 11:25 PM

## 2019-08-04 NOTE — Progress Notes (Signed)
[place] for Heparin Indication: atrial fibrillation  Allergies  Allergen Reactions  . Codeine Other (See Comments)    Reaction not recalled by family- was told to never take this    Patient Measurements: Height: 5' 6" (167.6 cm) Weight: 192 lb 14.4 oz (87.5 kg) IBW/kg (Calculated) : 59.3 Heparin Dosing Weight: 78 kg  Vital Signs: Temp: 95.9 F (35.5 C) (10/23 1153) Temp Source: Esophageal (10/23 1153) BP: 92/65 (10/23 1113) Pulse Rate: 99 (10/23 1113)  Labs: Recent Labs    08/03/19 1013  08/03/19 1147 08/03/19 1239 08/03/19 1550 08/03/19 2000 08/03/19 2300 08/04/19 0412 08/04/19 0415 08/04/19 0900 08/04/19 0934 08/04/19 1135  HGB  --    < > 11.4*  --   --   --   --  12.9 11.9*  --   --  12.2  HCT  --    < > 38.0  --   --   --   --  38.0 39.3  --   --  36.0  PLT  --   --  278  --   --   --   --   --  230  --   --   --   APTT  --   --  23*  --   --   --  122*  --   --  89*  --   --   LABPROT  --   --  16.9*  --   --  17.8*  --   --   --   --   --   --   INR  --   --  1.4*  --   --  1.5*  --   --   --   --   --   --   HEPARINUNFRC  --   --   --  1.22*  --   --   --   --   --  1.14*  --   --   CREATININE 2.64*  --   --   --   --   --  2.73*  --  2.79*  --   --   --   TROPONINIHS 48*  --  715*  --  2,793*  --   --   --   --   --  3,543*  --    < > = values in this interval not displayed.    Estimated Creatinine Clearance: 20.3 mL/min (A) (by C-G formula based on SCr of 2.79 mg/dL (H)).   Medical History: Past Medical History:  Diagnosis Date  . Anemia   . Chronic diastolic CHF (congestive heart failure) ([place])   . CKD (chronic kidney disease), stage IV ([place])   . Hypertension   . Persistent atrial fibrillation    a. dx 01/2019 s/p TEE DCCV.  Marland Kitchen Renal disorder   . Unilateral congenital absence of kidney     Medications:  Scheduled:  . atorvastatin  20 mg Per NG tube Daily  . chlorhexidine gluconate (MEDLINE KIT)  15 mL  Mouth Rinse BID  . Chlorhexidine Gluconate Cloth  6 each Topical Daily  . fentaNYL (SUBLIMAZE) injection  50 mcg Intravenous Once  . insulin aspart  0-9 Units Subcutaneous Q4H  . mouth rinse  15 mL Mouth Rinse 10 times per day  . midazolam  1 mg Intravenous Once  . pantoprazole (PROTONIX) IV  40 mg Intravenous QHS  . sodium chloride flush  10-40  mL Intracatheter Q12H   Infusions:  . sodium chloride 10 mL/hr at 08/03/19 1800  . amiodarone 30 mg/hr (08/04/19 0900)  . cefTRIAXone (ROCEPHIN)  IV Stopped (08/03/19 1409)  . fentaNYL infusion INTRAVENOUS Stopped (08/04/19 0408)  . heparin 900 Units/hr (08/04/19 0900)  . midazolam    . norepinephrine (LEVOPHED) Adult infusion Stopped (08/04/19 0704)    Assessment: Pt is a 73 y/o female admitted s/p out of hospital cardiac arrest. ROSC was achieved and targeted temperature management was initiated. Pt has a history of A-fib (taking eliquis PTA), diastolic heart failure, HTN, HDL, IDDM, CKD. Pharmacy has been consulted to dose heparin for A-fib.  Baseline aPTT is 23 and heparin level is 1.22. Patient was on Eliquis PTA but last dose is unknown. INR is 1.4. D-dimer is elevated at 11.22, troponins are elevated at 3500. Heparin drip 900 uts/hr aptt 89 sec at goal   Goal of Therapy:  Heparin level 0.3-0.7 units/ml aPTT 66-102 seconds Monitor platelets by anticoagulation protocol: Yes   Plan:  Continue heparin to 900 units/hr Daily HL, aptt cbc   Bonnita Nasuti Pharm.D. CPP, BCPS Clinical Pharmacist [phone] 08/04/2019 1:16 PM

## 2019-08-04 NOTE — Plan of Care (Signed)
No urine output, only has one kidney. EF low. Heparinized. Anasarca, will likely ned CRRT soon, BP labile. Currently on levophed. Not really responsive, withdrawals to pain. Recommend early pallative involvement

## 2019-08-04 NOTE — Progress Notes (Addendum)
NAME:  Jaclyn Day, MRN:  [id number], DOB:  01-Jul-1946, LOS: 1 ADMISSION DATE:  08/03/2019, CONSULTATION DATE:  10/22 REFERRING MD:  Dr. Sherril Croon, CHIEF COMPLAINT:  Post arrest    Brief History   73yo female admitted after out of hospital cardiac arrest, unclear etiology CPR started by fire 10 min CPR with 1 Epi before ROSC. PCCM called for admission and need for targeted temperature management and ventilator management.   Of note, admitted at this facility 01/25/2019-01/31/2019 for new onset A-fib with development of diastolic congestive heart failure, during admission she underwent TEE and cardioversion with transition to SR.    Past Medical History  Congenital unilateral kidney  CKD stage IV (baseline creatinine 2.2-2.8) Hypertension  Persistent A-fib on anticoagulation  Chronic diastolic CHF Anemia  Cardioversion and TEE 01/31/2019  Significant Hospital Events   10/22 Admitted post arrest, intubated, initiation of targeted temperature management   Consults:  PCCM  Procedures:  ETT 10/22 >> Right femoral CVC 10/22 >>  Significant Diagnostic Tests:  10/22:  ECHO: LVEF: 20-25%, global hypokinesis, RV midly reduced function with normal size, mild MR, normal pulmonary pressures.  10/22: CTH without acute intracranial pathology  Micro Data:  Blood culture 10/22 >> COVID 10/22 >> Negative RPP 10/22 >> Negative  Antimicrobials:  Rocephin 10/22  Interim history/subjective:  + 800 ml I/O TTM:  Weaned off Levophed overnight ECHO: LVEF: 20-25%, global hypokinesis, RV midly reduced function with normal size, mild MR, normal pulmonary pressures.  Lactate cleared  Objective   Blood pressure 122/81, pulse (!) 112, temperature 97.9 F (36.6 C), temperature source Esophageal, resp. rate (!) 27, height 5\' 6"  (1.676 m), weight 87.5 kg, SpO2 93 %.    Vent Mode: PRVC FiO2 (%):  [50 %-100 %] 50 % Set Rate:  [20 bmp] 20 bmp Vt Set:  [470 mL] 470 mL PEEP:  [5 cmH20] 5 cmH20  Plateau Pressure:  [17 [id number] cmH20] 21 cmH20   Intake/Output Summary (Last 24 hours) at 08/04/2019 0849 Last data filed at 08/04/2019 0500 Gross per 24 hour  Intake 991.15 ml  Output 185 ml  Net 806.15 ml   Filed Weights   08/03/19 0940  Weight: 87.5 kg   Examination: General: Elderly female, Intubated on no sedation HENT: [place]/AT Neck: no JVD CV: Afib.  Cool extremities Lungs: scattered rhonchi, thick beige secretions ABD: soft/non tender/ non distended GU: foley EXT: LUE healing skin tear, LEU also with old bruising, old bruising noted b/l knees and shin area.  Neuro: Eyes open to painful stimuli, + cough/gag, + corneal, PERRL, localizes with LUE. W/d RUE. Spontaneous movement to LE.    Resolved Hospital Problem list     Assessment & Plan:  This is a 73 yo F being admitted to ICU for  witnessed out of hospital cardiac arrest, unclear etiology, with field ROSC, resultant respiratory failure and encephalopathy. EKG shows anterolateral ST depression.   Out of hospital cardiac arrest  Plan Cardiology following Etiology: unclear: ddx: decompensated heart failure, infection, arrhythmia, afib with RVR, ischemia TTM to 36C: Rewarming to start: 1500 Goal K> 4, Mg > 2 MAP goal > 65  Acute hypoxic respiratory failure  Suspected Aspiration  P: Intubated 10/22: Continue ventilator support with lung protective strategies.  Continue to wean vent for SpO2 > [id number]  Acute systolic heart failure -ECHO: LVEF: 20-25%, global hypokinesis, RV midly reduced function with normal size, mild MR, normal pulmonary pressures.  P: Cardiology following, LHC pending goals of care discussions in setting of  CKD Not a candidate for BB/ACEI in setting of critical illness/code cool  Afib On home systemic anticoagulation with Eliquis P: Cardiology following Amiodarone drip Heparin drip  CKD III -Baseline Cr: 2.7 Plan Trend renal function and provide supportive care.  Anticipate  worsening renal function over the next few days in setting of cardiac arrest and heart failure.   Presumed UTI UA + Leuk, Bacteria, WBC P: Abx: Rocephin  Acute encephalopathy P: Concern for anoxic injury given unknown downtime (last seen 30 min prior being found unresponsive) CTH without acute intracranial pathology Neurological prognosis unknown, will need to re-evaluate once re-warmed. Continue to hold sedation  Best practice:  Diet: NPO.  TF once rewarmed Pain/Anxiety/Delirium protocol (if indicated): Y VAP protocol (if indicated): Y DVT prophylaxis: Heparin gtt  GI prophylaxis: PPI  Glucose control: SSI Q4H  Mobility: Bed rest  Code Status: FULL  Family Communication: Sister at bedside and updated.  She reports [name] has HCPOA and will be in later this morning.  Disposition: ICU   Labs   CBC: Recent Labs  Lab 08/03/19 1058 08/03/19 1147 08/04/19 0412 08/04/19 0415  WBC  --  32.1*  --  36.2*  NEUTROABS  --  30.2*  --   --   HGB 11.6* 11.4* 12.9 11.9*  HCT 34.0* 38.0 38.0 39.3  MCV  --  98.2  --  97.8  PLT  --  278  --  [id number]    Basic Metabolic Panel: Recent Labs  Lab 08/03/19 1013 08/03/19 1058 08/03/19 2300 08/04/19 0412 08/04/19 0415  NA 142 142 140 140 138  K 3.7 3.2* 3.8 3.7 3.7  CL 111  --  111  --  109  CO2 18*  --  16*  --  15*  GLUCOSE 145*  --  127*  --  116*  BUN 37*  --  41*  --  43*  CREATININE 2.64*  --  2.73*  --  2.79*  CALCIUM 8.2*  --  8.1*  --  8.0*  MG 1.8  --   --   --  2.2  PHOS  --   --   --   --  4.5   GFR: Estimated Creatinine Clearance: 20.3 mL/min (A) (by C-G formula based on SCr of 2.79 mg/dL (H)). Recent Labs  Lab 08/03/19 1013 08/03/19 1147 08/03/19 1550 08/03/19 2300 08/04/19 0415  PROCALCITON  --  0.88 2.79  --  6.46  WBC  --  32.1*  --   --  36.2*  LATICACIDVEN 4.3*  --  1.6 1.9  --     Liver Function Tests: Recent Labs  Lab 08/03/19 1013  AST 26  ALT 20  ALKPHOS 94  BILITOT 1.2  PROT 4.8*  ALBUMIN  2.3*   Recent Labs  Lab 08/03/19 1013  LIPASE 24   No results for input(s): AMMONIA in the last 168 hours.  ABG    Component Value Date/Time   PHART 7.331 (L) 08/04/2019 0412   PCO2ART 32.1 08/04/2019 0412   PO2ART 186.0 (H) 08/04/2019 0412   HCO3 17.3 (L) 08/04/2019 0412   TCO2 18 (L) 08/04/2019 0412   ACIDBASEDEF 8.0 (H) 08/04/2019 0412   O2SAT 100.0 08/04/2019 0412     Coagulation Profile: Recent Labs  Lab 08/03/19 1147 08/03/19 2000  INR 1.4* 1.5*    Cardiac Enzymes: No results for input(s): CKTOTAL, CKMB, CKMBINDEX, TROPONINI in the last 168 hours.  HbA1C: Hgb A1c MFr Bld  Date/Time Value Ref Range Status  08/03/2019 12:39 PM 5.4 4.8 - 5.6 % Final    Comment:    (NOTE) Pre diabetes:          5.7%-6.4% Diabetes:              >6.4% Glycemic control for   <7.0% adults with diabetes   01/26/2019 04:19 AM 5.2 4.8 - 5.6 % Final    Comment:    (NOTE) Pre diabetes:          5.7%-6.4% Diabetes:              >6.4% Glycemic control for   <7.0% adults with diabetes     CBG: Recent Labs  Lab 08/03/19 1607 08/03/19 2019 08/03/19 2314 08/04/19 0328 08/04/19 0807  GLUCAP 144* 104* 97 91 71      Home Medications  Prior to Admission medications   Medication Sig Start Date End Date Taking? Authorizing Provider  amLODipine (NORVASC) 10 MG tablet Take 10 mg by mouth daily. 12/30/18   [provider]  apixaban (ELIQUIS) 5 MG TABS tablet Take 1 tablet (5 mg total) by mouth 2 (two) times daily. 01/31/19   Wilber Oliphant, MD  ARIPiprazole (ABILIFY) 10 MG tablet Take 5 mg by mouth 2 (two) times daily. 12/27/18   [provider]  atorvastatin (LIPITOR) 20 MG tablet Take 20 mg by mouth daily.    [provider]  brimonidine (ALPHAGAN) 0.15 % ophthalmic solution Place 1 drop into the left eye 2 (two) times daily.     [provider]  divalproex (DEPAKOTE) 500 MG DR tablet Take 1,000 mg by mouth at bedtime. 12/11/18   [provider]  dorzolamide-timolol (COSOPT) 22.3-6.8 MG/ML ophthalmic solution Place 1 drop into both eyes 2 (two) times daily. 01/24/19   [provider]  DULoxetine (CYMBALTA) 60 MG capsule Take 60 mg by mouth daily. 01/01/19   [provider]  furosemide (LASIX) 40 MG tablet Take 1 tablet (40 mg total) by mouth daily. 02/01/19   Wilber Oliphant, MD  metoprolol succinate (TOPROL-XL) 50 MG 24 hr tablet Take 100 mg by mouth daily. 01/23/19   [provider]     Critical care time:     Paulita Fujita, ACNP [facility]  Pager: [phone]  CCT: 40 min   xxxxxxxxxxxxxxxxxxxxxxxxxxxxxxxxxxxxxxxxxxxxxxx  ATTESTATION & SIGNATURE   STAFF NOTE: I, Dr Ann Lions have personally reviewed patient's available data, including medical history, events of note, physical examination and test results as part of my evaluation. I have discussed with resident/NP and other care providers such as pharmacist, RN and RRT.  In addition,  I personally evaluated patient and elicited key findings of   S: Date of admit 08/03/2019 with LOS 1 for today 08/04/2019 : Jaclyn Day is  - going to rewarm . At goal 36C. On fent gtt. Able to perceive voice. Wiggles toes. Ur Op less and creat worse. Mild to mod metab acidosis +  O:  Blood pressure (!) 141/109, pulse (!) 112, temperature 97.9 F (36.6 C), temperature source Esophageal, resp. rate (!) 0, height 5\' 6"  (1.676 m), weight 87.5 kg, SpO2 94 %.   RASS -4 equivalent  On vent Synch with vent CTA bilaterally       A: acut resp failure due to coma and cardiac arrrest With AKI worse now and acidosis  Inciting event likely sepsis given high PCT  P: vent suppprt Get ABG - if acidosis worse - call renal and start  bic gtt Follow through with hypothermia protocol Abx for possible UTI (   Anti-infectives (From admission, onward)   Start     Dose/Rate Route Frequency Ordered Stop   08/03/19 1300  cefTRIAXone (ROCEPHIN) 2 g  in sodium chloride 0.9 % 100 mL IVPB     2 g 200 mL/hr over 30 Minutes Intravenous Every 24 hours 08/03/19 1251       Recent Labs  Lab 08/03/19 1147 08/03/19 1550 08/04/19 0415  PROCALCITON 0.88 2.79 6.46     Rest per NP/medical resident whose note is outlined above and that I agree with  The patient is critically ill with multiple organ systems failure and requires high complexity decision making for assessment and support, frequent evaluation and titration of therapies, application of advanced monitoring technologies and extensive interpretation of multiple databases.   Critical Care Time devoted to patient care services described in this note is  30  Minutes. This time reflects time of care of this signee Dr Brand Males. This critical care time does not reflect procedure time, or teaching time or supervisory time of PA/NP/Med student/Med Resident etc but could involve care discussion time     Dr. Brand Males, M.D., [facility].C.P Pulmonary and Critical Care Medicine Staff Physician [facility] Pager: [phone], If no answer or between  15:00h - 7:00h: call [phone]  08/04/2019 10:20 AM

## 2019-08-04 NOTE — Progress Notes (Signed)
Initial Nutrition Assessment  DOCUMENTATION CODES:   Obesity unspecified  INTERVENTION:   Tube feeding recommendations: - Vital High Protein @ 40 ml/hr (960 ml/day) via OG tube - Pro-stat 30 ml TID  Recommended tube feeding regimen provides 1260 kcal, 129 grams of protein, and 803 ml of H2O.   NUTRITION DIAGNOSIS:   Inadequate oral intake related to inability to eat as evidenced by NPO status.  GOAL:   Patient will meet greater than or equal to 90% of their needs  MONITOR:   Vent status, Labs, Weight trends, Skin, I & O's  REASON FOR ASSESSMENT:   Ventilator    ASSESSMENT:   73 year old female who presented to the ED on 10/22 after cardiac arrest. PMH anemia, CHF, CKD stage IV, HTN, IDDM. Pt required intubation in the ED. TTM initiated.   Discussed pt with RN and during ICU rounds. Levophed and Fentanyl off this AM.  Per CCM, no plans to start TF until pt is rewarmed. Rewarming to start today around 1500. RD to leave TF recommendations.  OG tube in place, currently to low intermittent suction.  Patient is currently intubated on ventilator support MV: 10.9 L/min Temp (24hrs), Avg:96.4 F (35.8 C), Min:95.2 F (35.1 C), Max:97.9 F (36.6 C) BP (a-line): 92/65 MAP (a-line): 77  Drips: Amiodarone: 16.7 ml/hr Heparin:9 ml/hr  Medications reviewed and include: SSI q 4 hours, Protonix, IV abx  Labs reviewed. CBG's: 71-144 x 24 hours  UOP: 35 ml x 24 hours OGT: 150 ml x 24 hours  NUTRITION - FOCUSED PHYSICAL EXAM:    Most Recent Value  Orbital Region  Mild depletion  Upper Arm Region  No depletion  Thoracic and Lumbar Region  Unable to assess  Buccal Region  Unable to assess  Temple Region  No depletion  Clavicle Bone Region  Mild depletion  Clavicle and Acromion Bone Region  Mild depletion  Scapular Bone Region  Unable to assess  Dorsal Hand  No depletion  Patellar Region  No depletion  Anterior Thigh Region  No depletion  Posterior Calf Region  No  depletion  Edema (RD Assessment)  Moderate [generalized]  Hair  Reviewed  Eyes  Unable to assess  Mouth  Unable to assess  Skin  Reviewed  Nails  Reviewed       Diet Order:   Diet Order    None      EDUCATION NEEDS:   No education needs have been identified at this time  Skin:  Skin Assessment: Skin Integrity Issues: Skin Integrity Issues: DTI: sacrum  Last BM:  08/03/19  Height:   Ht Readings from Last 1 Encounters:  08/03/19 5\' 6"  (1.676 m)    Weight:   Wt Readings from Last 1 Encounters:  08/03/19 87.5 kg    Ideal Body Weight:  59.1 kg  BMI:  Body mass index is 31.14 kg/m.  Estimated Nutritional Needs:   Kcal:  [id number]  Protein:  118-138 grams  Fluid:  >/= 1.3 L    Gaynell Face, MS, RD, LDN Inpatient Clinical Dietitian Pager: [phone] Weekend/After Hours: [phone]

## 2019-08-04 NOTE — Progress Notes (Signed)
[facility] for Heparin Indication: atrial fibrillation  Allergies  Allergen Reactions  . Codeine Other (See Comments)    Reaction not recalled by family- was told to never take this    Patient Measurements: Height: 5\' 6"  (167.6 cm) Weight: 192 lb 14.4 oz (87.5 kg) IBW/kg (Calculated) : 59.3 Heparin Dosing Weight: 78 kg  Vital Signs: Temp: 96.4 F (35.8 C) (10/22 2345) Temp Source: Core (10/22 2345) BP: 120/80 (10/22 2357) Pulse Rate: 97 (10/22 2357)  Labs: Recent Labs    08/03/19 1013 08/03/19 1058 08/03/19 1147 08/03/19 1239 08/03/19 1550 08/03/19 2000 08/03/19 2300  HGB  --  11.6* 11.4*  --   --   --   --   HCT  --  34.0* 38.0  --   --   --   --   PLT  --   --  278  --   --   --   --   APTT  --   --  23*  --   --   --  122*  LABPROT  --   --  16.9*  --   --  17.8*  --   INR  --   --  1.4*  --   --  1.5*  --   HEPARINUNFRC  --   --   --  1.22*  --   --   --   CREATININE 2.64*  --   --   --   --   --  2.73*  TROPONINIHS 48*  --  715*  --  2,793*  --   --     Estimated Creatinine Clearance: 20.8 mL/min (A) (by C-G formula based on SCr of 2.73 mg/dL (H)).   Medical History: Past Medical History:  Diagnosis Date  . Anemia   . Chronic diastolic CHF (congestive heart failure) ([place])   . CKD (chronic kidney disease), stage IV ([place])   . Hypertension   . Persistent atrial fibrillation    a. dx 01/2019 s/p TEE DCCV.  Marland Kitchen Renal disorder   . Unilateral congenital absence of kidney     Medications:  Scheduled:  . atorvastatin  20 mg Per NG tube Daily  . Chlorhexidine Gluconate Cloth  6 each Topical Daily  . fentaNYL (SUBLIMAZE) injection  50 mcg Intravenous Once  . insulin aspart  0-9 Units Subcutaneous Q4H  . midazolam  1 mg Intravenous Once  . pantoprazole (PROTONIX) IV  40 mg Intravenous QHS   Infusions:  . sodium chloride 10 mL/hr at 08/03/19 1800  . amiodarone 30 mg/hr (08/03/19 2215)  . cefTRIAXone (ROCEPHIN)  IV  Stopped (08/03/19 1409)  . fentaNYL infusion INTRAVENOUS 25 mcg/hr (08/03/19 2300)  . heparin 1,100 Units/hr (08/03/19 2300)  . midazolam    . norepinephrine (LEVOPHED) Adult infusion 5 mcg/min (08/03/19 2300)    Assessment: Pt is a 73 y/o female admitted s/p out of hospital cardiac arrest. ROSC was achieved and targeted temperature management was initiated. Pt has a history of A-fib (taking eliquis PTA), diastolic heart failure, HTN, HDL, IDDM, CKD. Pharmacy has been consulted to dose heparin for A-fib.  Baseline aPTT is 23 and heparin level is 1.22. Patient was on Eliquis PTA but last dose is unknown. INR is 1.4. D-dimer is elevated at 11.22, troponins are elevated at 715.  10/23 AM update: aPTT elevated, no issues per RN  Goal of Therapy:  Heparin level 0.3-0.7 units/ml aPTT 66-102 seconds Monitor platelets by anticoagulation protocol: Yes  Plan:  Dec heparin to 900 units/hr Re-check heparin level and aPTT in 8 hours  Narda Bonds, PharmD, BCPS Clinical Pharmacist Phone: [phone]

## 2019-08-04 NOTE — Progress Notes (Addendum)
Progress Note  Patient Name: Jaclyn Day Date of Encounter: 08/04/2019  Primary Cardiologist: Ena Dawley, MD   Subjective   O/N Events: No urinary output. Requiring pressor support for hypotension  [name] was examined and evaluated at bedside this AM. She was observed having non-specific movement of her toes but otherwise is unresponsive. Does not respond to verbal or painful stimuli.  Inpatient Medications    Scheduled Meds:  atorvastatin  20 mg Per NG tube Daily   Chlorhexidine Gluconate Cloth  6 each Topical Daily   fentaNYL (SUBLIMAZE) injection  50 mcg Intravenous Once   insulin aspart  0-9 Units Subcutaneous Q4H   midazolam  1 mg Intravenous Once   pantoprazole (PROTONIX) IV  40 mg Intravenous QHS   Continuous Infusions:  sodium chloride 10 mL/hr at 08/03/19 1800   amiodarone 30 mg/hr (08/04/19 0500)   cefTRIAXone (ROCEPHIN)  IV Stopped (08/03/19 1409)   fentaNYL infusion INTRAVENOUS Stopped (08/04/19 0408)   heparin 900 Units/hr (08/04/19 0500)   midazolam     norepinephrine (LEVOPHED) Adult infusion Stopped (08/04/19 0042)   PRN Meds: fentaNYL, midazolam   Vital Signs    Vitals:   08/04/19 0630 08/04/19 0645 08/04/19 0648 08/04/19 0700  BP:    (!) 132/112  Pulse:      Resp: (!) 0 (!) 0 (!) 0 (!) 6  Temp:   97.7 F (36.5 C)   TempSrc:   Rectal   SpO2:      Weight:      Height:        Intake/Output Summary (Last 24 hours) at 08/04/2019 0705 Last data filed at 08/04/2019 0500 Gross per 24 hour  Intake 991.15 ml  Output 185 ml  Net 806.15 ml   Filed Weights   08/03/19 0940  Weight: 87.5 kg    Telemetry    Continues to be in A.fib with HR 80-100s - Personally Reviewed  ECG    Atrial Fibrillation,Flat T waves on anterolateral leads. Resolution of ST depressions - Personally Reviewed  Physical Exam   Gen: Well-developed, ill-appearing, sedated HEENT: NCAT head, hearing intact, EOMi, ETtube in place Neck:  supple, ROM intact CV: Irregularly irregular, S1, S2 normal, No rubs, no murmurs, no gallops Pulm: Bilateral rales, no wheezes Abd: Soft, Hypoactive bs, Distended Extm: ROM intact, Peripheral pulses intact, 1+ pitting edema bilateral lower extremities Skin: Dry, Warm, normal turgor, no rashes, lesions  Neuro: Sedated  Labs    Chemistry Recent Labs  Lab 08/03/19 1013  08/03/19 2300 08/04/19 0412 08/04/19 0415  NA 142   < > 140 140 138  K 3.7   < > 3.8 3.7 3.7  CL 111  --  111  --  109  CO2 18*  --  16*  --  15*  GLUCOSE 145*  --  127*  --  116*  BUN 37*  --  41*  --  43*  CREATININE 2.64*  --  2.73*  --  2.79*  CALCIUM 8.2*  --  8.1*  --  8.0*  PROT 4.8*  --   --   --   --   ALBUMIN 2.3*  --   --   --   --   AST 26  --   --   --   --   ALT 20  --   --   --   --   ALKPHOS 94  --   --   --   --   BILITOT 1.2  --   --   --   --  GFRNONAA 17*  --  17*  --  16*  GFRAA 20*  --  19*  --  19*  ANIONGAP 13  --  13  --  14   < > = values in this interval not displayed.     Hematology Recent Labs  Lab 08/03/19 1147 08/04/19 0412 08/04/19 0415  WBC 32.1*  --  36.2*  RBC 3.87  --  4.02  HGB 11.4* 12.9 11.9*  HCT 38.0 38.0 39.3  MCV 98.2  --  97.8  MCH 29.5  --  29.6  MCHC 30.0  --  30.3  RDW 16.3*  --  16.5*  PLT 278  --  230    Cardiac EnzymesNo results for input(s): TROPONINI in the last 168 hours. No results for input(s): TROPIPOC in the last 168 hours.   BNPNo results for input(s): BNP, PROBNP in the last 168 hours.   DDimer  Recent Labs  Lab 08/03/19 1239  DDIMER 11.22*     Radiology    Ct Head Wo Contrast  Result Date: 08/03/2019 CLINICAL DATA:  Found unresponsive EXAM: CT HEAD WITHOUT CONTRAST TECHNIQUE: Contiguous axial images were obtained from the base of the skull through the vertex without intravenous contrast. COMPARISON:  None. FINDINGS: Brain: Mild chronic small vessel disease throughout the deep white matter. Mild cerebral atrophy. No acute  intracranial abnormality. Specifically, no hemorrhage, hydrocephalus, mass lesion, acute infarction, or significant intracranial injury. Vascular: No hyperdense vessel or unexpected calcification. Skull: No acute calvarial abnormality. Sinuses/Orbits: Visualized paranasal sinuses and mastoids clear. Orbital soft tissues unremarkable. Other: None IMPRESSION: Atrophy, chronic microvascular disease. No acute intracranial abnormality. Electronically Signed   By: Rolm Baptise M.D.   On: 08/03/2019 13:37   Dg Chest Portable 1 View  Result Date: 08/03/2019 CLINICAL DATA:  ET tube placement EXAM: PORTABLE CHEST 1 VIEW COMPARISON:  01/25/2019 FINDINGS: Endotracheal tube is 2 cm above the carina. NG tube is in the stomach. Cardiomegaly. Bilateral perihilar and lower lobe opacities, right greater than left could reflect edema or infection. No visible effusions or acute bony abnormality. IMPRESSION: Endotracheal tube 2 cm above the carina. Cardiomegaly. Mild perihilar and lower lobe opacities, right greater than left could reflect edema or infection. Electronically Signed   By: Rolm Baptise M.D.   On: 08/03/2019 10:48    Cardiac Studies   TTE 08/03/19  1. Left ventricular ejection fraction, by visual estimation, is 20 to 25%. The left ventricle has severely decreased function. Normal left ventricular size. There is moderately increased left ventricular hypertrophy. Severe global hypokinesis.  2. Left ventricular diastolic function could not be evaluated pattern of LV diastolic filling.  3. Global right ventricle has mildly reduced systolic function.The right ventricular size is normal. No increase in right ventricular wall thickness.  4. Left atrial size was severely dilated.  5. Right atrial size was normal.  6. The mitral valve is abnormal. Mild mitral valve regurgitation.  7. The tricuspid valve is grossly normal. Tricuspid valve regurgitation is trivial.  8. The aortic valve is tricuspid Aortic valve  regurgitation was not visualized by color flow Doppler.  9. The pulmonic valve was grossly normal. Pulmonic valve regurgitation is not visualized by color flow Doppler. 10. Normal pulmonary artery systolic pressure. 11. The inferior vena cava is normal in size with <50% respiratory variability, suggesting right atrial pressure of 8 mmHg. (on vent) 12. The interatrial septum was not well visualized.  Patient Profile     WAUNDA MOHAN is a 73 y.o.  female with a hx of A.fib on Eliquis, Diastolic heart failure, Htn, HLd, IDDM, CKD 4 w/ hx of nephrectomy admit post-arrest, found to be in A.fib w/ RVR and worsening systolic heart failure  Assessment & Plan    1. Post Arrest-Care Presented to [facility] after arrest - ROSC after 10 minutes, 1 dose of Epi. No record of presenting rhythm. EKG w/ ST depressions on anterolateral leads, now resolved. HsTrop 48->715->2793. D-dimer 11.22. Procalcitonin 0.88 Wbc 32.1->36.2 Lactate 4.3->1.9. CT head w/o acute findings. Resp viral panel neg. COVID neg. Required levophed overnight but currently off pressors. Echocardiogram showing diffuse hypokinesis w/ significantly worsened EF (20-25%) compared to prior. EEG showing no seizures. - C/w targeted temperature management - Levophed as needed to keep MAP >65 - C/w ceftriaxone - Will need LHC but with CKD4 will need to have goals of care discussion prior regarding likelihood of progression to dialysis - Will need EP eval for ICD  2. Atrial Fibrillation Prior hx of A.fib converted to sinus after cardioversion. Current rhythm A.fib w/ HR 100-120s. Will likely remain tachycardic until she recovers from underlying cause - C/w heparin gtt - C/w amiodarone gtt - Monitor/replete K/Mag as appropriate - C/w telemetry  3. Acute systolic heart failure Prior hx of diastolic heart failure now with new acute systolic heart failure. Echo showing global hypokinesis. Possibly sequelae of arrest. Cannot rule out ischemic event.  As stated above, need ischemic eval with cath but likely will worsen kidney disease. - Repeat Echo at later date (08/07/19) - Daily weights, I&Os - C/w telemetry  4. Chronic Kidney Disease stage 4 Hx of L nephrectomy Baseline creatinine 2.7. Admit creatinine 2.64 -> 2.79. Worsening function with minimal urinary output. - Consider consulting nephro if no improvement over the weekend  - Trend renal fx - Avoid nephrotoxic meds when able    For questions or updates, please contact [place] Please consult [email or web address] for contact info under Cardiology/STEMI.   Signed, Gilberto Better, MD PGY-2, [facility] Pager: [phone] 08/04/2019, 7:05 AM    Attending attestation to follow  The patient was seen, examined and discussed with the resident Mosetta Anis, MD   and I agree with the above.   Not significantly changed overnight, patient is still being cooled down, she remains in atrial fibrillation with RVR heart rates in 110s to 120s, telemetry does not show any other arrhythmias, her blood pressure has improved she is now off pressors, however given the fact that she has new severely depressed LVEF I would not start beta-blockers and just continue amiodarone for now.  Her kidney function is slightly worse creatinine down from 2.6-2.8, we will follow, again determining factor will be her neurological recovery.  Overall prognosis very poor with multiorgan failure and high lactic acid level on admission.  We will follow.  Ena Dawley, MD 08/04/2019

## 2019-08-04 NOTE — Progress Notes (Signed)
Notified of HR 120s afib, request for fluids  Already on amiodarone drip Positive 1.3 liters CVP 8 on positive pressure ventilation  Will give a trial of 500 cc NS bolus Continue to titrate down norepineprhine

## 2019-08-05 ENCOUNTER — Inpatient Hospital Stay (HOSPITAL_COMMUNITY): Payer: 59

## 2019-08-05 DIAGNOSIS — J9601 Acute respiratory failure with hypoxia: Secondary | ICD-10-CM | POA: Diagnosis not present

## 2019-08-05 DIAGNOSIS — I469 Cardiac arrest, cause unspecified: Secondary | ICD-10-CM | POA: Diagnosis not present

## 2019-08-05 LAB — BASIC METABOLIC PANEL
Anion gap: 14 (ref 5–15)
Anion gap: 15 (ref 5–15)
BUN: 48 mg/dL — ABNORMAL HIGH (ref 8–23)
BUN: 51 mg/dL — ABNORMAL HIGH (ref 8–23)
CO2: 16 mmol/L — ABNORMAL LOW (ref 22–32)
CO2: 16 mmol/L — ABNORMAL LOW (ref 22–32)
Calcium: 7.9 mg/dL — ABNORMAL LOW (ref 8.9–10.3)
Calcium: 8 mg/dL — ABNORMAL LOW (ref 8.9–10.3)
Chloride: 111 mmol/L (ref 98–111)
Chloride: 109 mmol/L (ref 98–111)
Creatinine, Ser: 3.55 mg/dL — ABNORMAL HIGH (ref 0.44–1.00)
Creatinine, Ser: 3.74 mg/dL — ABNORMAL HIGH (ref 0.44–1.00)
GFR calc Af Amer: 14 mL/min — ABNORMAL LOW (ref >60)
GFR calc Af Amer: 13 mL/min — ABNORMAL LOW (ref >60)
GFR calc non Af Amer: 12 mL/min — ABNORMAL LOW (ref >60)
GFR calc non Af Amer: 11 mL/min — ABNORMAL LOW (ref >60)
Glucose, Bld: 96 mg/dL (ref 70–99)
Glucose, Bld: 138 mg/dL — ABNORMAL HIGH (ref 70–99)
Potassium: 3.8 mmol/L (ref 3.5–5.1)
Potassium: 3.6 mmol/L (ref 3.5–5.1)
Sodium: 141 mmol/L (ref 135–145)
Sodium: 140 mmol/L (ref 135–145)

## 2019-08-05 LAB — GLUCOSE, CAPILLARY
Glucose-Capillary: 100 mg/dL — ABNORMAL HIGH (ref 70–99)
Glucose-Capillary: 118 mg/dL — ABNORMAL HIGH (ref 70–99)
Glucose-Capillary: 130 mg/dL — ABNORMAL HIGH (ref 70–99)
Glucose-Capillary: 142 mg/dL — ABNORMAL HIGH (ref 70–99)
Glucose-Capillary: 145 mg/dL — ABNORMAL HIGH (ref 70–99)
Glucose-Capillary: 58 mg/dL — ABNORMAL LOW (ref 70–99)
Glucose-Capillary: 69 mg/dL — ABNORMAL LOW (ref 70–99)
Glucose-Capillary: 86 mg/dL (ref 70–99)
Glucose-Capillary: 88 mg/dL (ref 70–99)
Glucose-Capillary: 93 mg/dL (ref 70–99)

## 2019-08-05 LAB — PHOSPHORUS: Phosphorus: 4.9 mg/dL — ABNORMAL HIGH (ref 2.5–4.6)

## 2019-08-05 LAB — HEPARIN LEVEL (UNFRACTIONATED): Heparin Unfractionated: 0.95 IU/mL — ABNORMAL HIGH (ref 0.30–0.70)

## 2019-08-05 LAB — POCT I-STAT 7, (LYTES, BLD GAS, ICA,H+H)
Acid-base deficit: 8 mmol/L — ABNORMAL HIGH (ref 0.0–2.0)
Bicarbonate: 16.5 mmol/L — ABNORMAL LOW (ref 20.0–28.0)
Calcium, Ion: 1.18 mmol/L (ref 1.15–1.40)
HCT: 27 % — ABNORMAL LOW (ref 36.0–46.0)
Hemoglobin: 9.2 g/dL — ABNORMAL LOW (ref 12.0–15.0)
O2 Saturation: 100 %
Patient temperature: 97.8
Potassium: 3.5 mmol/L (ref 3.5–5.1)
Sodium: 138 mmol/L (ref 135–145)
TCO2: 17 mmol/L — ABNORMAL LOW (ref 22–32)
pCO2 arterial: 28.9 mmHg — ABNORMAL LOW (ref 32.0–48.0)
pH, Arterial: 7.362 (ref 7.350–7.450)
pO2, Arterial: 176 mmHg — ABNORMAL HIGH (ref 83.0–108.0)

## 2019-08-05 LAB — URINALYSIS, MICROSCOPIC (REFLEX): WBC, UA: >50 WBC/hpf (ref 0–5)

## 2019-08-05 LAB — URINE CULTURE: Culture: >=100000 — AB

## 2019-08-05 LAB — URINALYSIS, ROUTINE W REFLEX MICROSCOPIC

## 2019-08-05 LAB — CBC
HCT: 32.7 % — ABNORMAL LOW (ref 36.0–46.0)
Hemoglobin: 10 g/dL — ABNORMAL LOW (ref 12.0–15.0)
MCH: 29.3 pg (ref 26.0–34.0)
MCHC: 30.6 g/dL (ref 30.0–36.0)
MCV: 95.9 fL (ref 80.0–100.0)
Platelets: 227 10*3/uL (ref 150–400)
RBC: 3.41 MIL/uL — ABNORMAL LOW (ref 3.87–5.11)
RDW: 16.8 % — ABNORMAL HIGH (ref 11.5–15.5)
WBC: 31.5 10*3/uL — ABNORMAL HIGH (ref 4.0–10.5)
nRBC: 0.1 % (ref 0.0–0.2)

## 2019-08-05 LAB — PROCALCITONIN: Procalcitonin: 7.98 ng/mL

## 2019-08-05 LAB — APTT: aPTT: 66 seconds — ABNORMAL HIGH (ref 24–36)

## 2019-08-05 LAB — MAGNESIUM: Magnesium: 1.9 mg/dL (ref 1.7–2.4)

## 2019-08-05 MED ORDER — VANCOMYCIN VARIABLE DOSE PER UNSTABLE RENAL FUNCTION (PHARMACIST DOSING): Status: DC

## 2019-08-05 MED ORDER — VANCOMYCIN HCL 10 G IV SOLR
1750.0000 mg | Freq: Once | INTRAVENOUS | Status: AC
  Administered 2019-08-05: 16:00:00 1750 mg via INTRAVENOUS
  Filled 2019-08-05: qty 1750

## 2019-08-05 MED ORDER — DEXTROSE 50 % IV SOLN
INTRAVENOUS | Status: AC
  Administered 2019-08-05: 50 mL
  Filled 2019-08-05: qty 50

## 2019-08-05 MED ORDER — FUROSEMIDE 10 MG/ML IJ SOLN
160.0000 mg | Freq: Once | INTRAVENOUS | Status: AC
  Administered 2019-08-05: 160 mg via INTRAVENOUS
  Filled 2019-08-05: qty 10
  Filled 2019-08-05: qty 16

## 2019-08-05 MED ORDER — MAGNESIUM SULFATE 2 GM/50ML IV SOLN
2.0000 g | Freq: Once | INTRAVENOUS | Status: AC
  Administered 2019-08-05: 2 g via INTRAVENOUS
  Filled 2019-08-05: qty 50

## 2019-08-05 MED ORDER — PRO-STAT SUGAR FREE PO LIQD
30.0000 mL | Freq: Three times a day (TID) | ORAL | Status: DC
  Administered 2019-08-05 – 2019-08-08 (×11): 30 mL
  Filled 2019-08-05 (×11): qty 30

## 2019-08-05 MED ORDER — PANTOPRAZOLE SODIUM 40 MG PO PACK
40.0000 mg | PACK | Freq: Every day | ORAL | Status: DC
  Administered 2019-08-05 – 2019-08-12 (×8): 40 mg
  Filled 2019-08-05 (×10): qty 20

## 2019-08-05 MED ORDER — VITAL HIGH PROTEIN PO LIQD
1000.0000 mL | ORAL | Status: DC
  Administered 2019-08-05 – 2019-08-08 (×4): 1000 mL

## 2019-08-05 NOTE — Progress Notes (Signed)
Progress Note  Patient Name: Jaclyn Day Date of Encounter: 08/05/2019  Primary Cardiologist: Ena Dawley, MD   Subjective   Winces to stimuli, but eyes closed. Not purposefully following commands. Not on sedation. Urine output poor overnight- creatinine increased to 3.55. Noted to have PVC's and narrow complex tachycardia overnight - suspect SVT- on amiodarone and in sinus today. Continues on IV heparin.  Inpatient Medications    Scheduled Meds: . atorvastatin  20 mg Per NG tube Daily  . chlorhexidine gluconate (MEDLINE KIT)  15 mL Mouth Rinse BID  . Chlorhexidine Gluconate Cloth  6 each Topical Daily  . fentaNYL (SUBLIMAZE) injection  50 mcg Intravenous Once  . insulin aspart  0-9 Units Subcutaneous Q4H  . mouth rinse  15 mL Mouth Rinse 10 times per day  . midazolam  1 mg Intravenous Once  . pantoprazole sodium  40 mg Per Tube QHS  . sodium chloride flush  10-40 mL Intracatheter Q12H   Continuous Infusions: . sodium chloride 10 mL/hr at 08/03/19 1800  . sodium chloride 20 mL/hr at 08/05/19 0700  . amiodarone 30 mg/hr (08/05/19 0700)  . cefTRIAXone (ROCEPHIN)  IV Stopped (08/04/19 1447)  . fentaNYL infusion INTRAVENOUS Stopped (08/04/19 2305)  . heparin 900 Units/hr (08/05/19 0700)  . midazolam    . norepinephrine (LEVOPHED) Adult infusion Stopped (08/05/19 0105)  . phenylephrine (NEO-SYNEPHRINE) Adult infusion 20 mcg/min (08/05/19 0700)   PRN Meds: fentaNYL, fentaNYL (SUBLIMAZE) injection, midazolam, sodium chloride flush   Vital Signs    Vitals:   08/05/19 0530 08/05/19 0600 08/05/19 0700 08/05/19 0751  BP: (!) 122/54 120/66 117/61   Pulse: 63 71 70   Resp: (!) '3 12 12   ' Temp:  98.6 F (37 C) 98.6 F (37 C) 98.8 F (37.1 C)  TempSrc:    Oral  SpO2: 92% 100% 100% 100%  Weight:  79.3 kg    Height:        Intake/Output Summary (Last 24 hours) at 08/05/2019 0800 Last data filed at 08/05/2019 0700 Gross per 24 hour  Intake 1227.31 ml  Output 175  ml  Net 1052.31 ml   Filed Weights   08/03/19 0940 08/05/19 0600  Weight: 87.5 kg 79.3 kg    Telemetry    Sinus rhythm with SVT O/N, PVCS - Personally Reviewed  ECG    Sinus rhythm with PVC's - Personally Reviewed  Physical Exam   General appearance: winces to painful stimuli, not purposefully following commands Neck: no carotid bruit, no JVD and thyroid not enlarged, symmetric, no tenderness/mass/nodules Lungs: clear to auscultation bilaterally Heart: regular rate and rhythm Abdomen: obese Extremities: edema 1+ Pulses: 2+ and symmetric Skin: pale, warm,dry Neurologic: Mental status: intubated, winces to pain, does not open eyes or follow commands Psych: Cannot assess  Labs    Chemistry Recent Labs  Lab 08/03/19 1013  08/04/19 1146 08/04/19 2059 08/05/19 0521  NA 142   < > 146* 139 141  K 3.7   < > 3.9 4.0 3.8  CL 111   < > 115* 108 111  CO2 18*   < > 17* 15* 16*  GLUCOSE 145*   < > 105* 112* 96  BUN 37*   < > 45* 48* 48*  CREATININE 2.64*   < > 3.16* 3.35* 3.55*  CALCIUM 8.2*   < > 8.3* 8.2* 7.9*  PROT 4.8*  --   --   --   --   ALBUMIN 2.3*  --   --   --   --  AST 26  --   --   --   --   ALT 20  --   --   --   --   ALKPHOS 94  --   --   --   --   BILITOT 1.2  --   --   --   --   GFRNONAA 17*   < > 14* 13* 12*  GFRAA 20*   < > 16* 15* 14*  ANIONGAP 13   < > 14 16* 14   < > = values in this interval not displayed.     Hematology Recent Labs  Lab 08/03/19 1147  08/04/19 0415 08/04/19 1135 08/05/19 0521  WBC 32.1*  --  36.2*  --  31.5*  RBC 3.87  --  4.02  --  3.41*  HGB 11.4*   < > 11.9* 12.2 10.0*  HCT 38.0   < > 39.3 36.0 32.7*  MCV 98.2  --  97.8  --  95.9  MCH 29.5  --  29.6  --  29.3  MCHC 30.0  --  30.3  --  30.6  RDW 16.3*  --  16.5*  --  16.8*  PLT 278  --  230  --  227   < > = values in this interval not displayed.    Cardiac EnzymesNo results for input(s): TROPONINI in the last 168 hours. No results for input(s): TROPIPOC in the  last 168 hours.   BNPNo results for input(s): BNP, PROBNP in the last 168 hours.   DDimer  Recent Labs  Lab 08/03/19 1239  DDIMER 11.22*     Radiology    Ct Head Wo Contrast  Result Date: 08/03/2019 CLINICAL DATA:  Found unresponsive EXAM: CT HEAD WITHOUT CONTRAST TECHNIQUE: Contiguous axial images were obtained from the base of the skull through the vertex without intravenous contrast. COMPARISON:  None. FINDINGS: Brain: Mild chronic small vessel disease throughout the deep white matter. Mild cerebral atrophy. No acute intracranial abnormality. Specifically, no hemorrhage, hydrocephalus, mass lesion, acute infarction, or significant intracranial injury. Vascular: No hyperdense vessel or unexpected calcification. Skull: No acute calvarial abnormality. Sinuses/Orbits: Visualized paranasal sinuses and mastoids clear. Orbital soft tissues unremarkable. Other: None IMPRESSION: Atrophy, chronic microvascular disease. No acute intracranial abnormality. Electronically Signed   By: Rolm Baptise M.D.   On: 08/03/2019 13:37   US Renal  Result Date: 08/04/2019 CLINICAL DATA:  Acute kidney injury EXAM: RENAL / URINARY TRACT ULTRASOUND COMPLETE COMPARISON:  08/25/2005 ultrasound, CT 11/06/2008 FINDINGS: Right Kidney: Renal measurements: 11.9 x 5.8 x 6.3 cm = volume: 226.6 mL. Increased cortical echogenicity with diffuse cortical thinning. No hydronephrosis. Cysts, fewer than 10 within the right kidney. Midpole cyst measures 2.9 x 2.5 by 2.2 cm. Upper pole cyst measures 1.9 x 1.2 by 1.2 cm. Lower pole cyst measures 1.7 x 1.4 x 1.6 cm. Left Kidney: Nonvisualized and surgically absent Bladder: Not visualized and presumed empty Other: Right pleural effusion IMPRESSION: 1. Status post left nephrectomy 2. Echogenic right kidney with cortical thinning consistent with medical renal disease. No hydronephrosis. Cysts, fewer than 10 in the right kidney. 3. Right pleural effusion Electronically Signed   By: Donavan Foil M.D.   On: 08/04/2019 16:12   Dg Chest Portable 1 View  Result Date: 08/03/2019 CLINICAL DATA:  ET tube placement EXAM: PORTABLE CHEST 1 VIEW COMPARISON:  01/25/2019 FINDINGS: Endotracheal tube is 2 cm above the carina. NG tube is in the stomach. Cardiomegaly. Bilateral perihilar and lower lobe  opacities, right greater than left could reflect edema or infection. No visible effusions or acute bony abnormality. IMPRESSION: Endotracheal tube 2 cm above the carina. Cardiomegaly. Mild perihilar and lower lobe opacities, right greater than left could reflect edema or infection. Electronically Signed   By: Rolm Baptise M.D.   On: 08/03/2019 10:48    Cardiac Studies   TTE 08/03/19  1. Left ventricular ejection fraction, by visual estimation, is 20 to 25%. The left ventricle has severely decreased function. Normal left ventricular size. There is moderately increased left ventricular hypertrophy. Severe global hypokinesis.  2. Left ventricular diastolic function could not be evaluated pattern of LV diastolic filling.  3. Global right ventricle has mildly reduced systolic function.The right ventricular size is normal. No increase in right ventricular wall thickness.  4. Left atrial size was severely dilated.  5. Right atrial size was normal.  6. The mitral valve is abnormal. Mild mitral valve regurgitation.  7. The tricuspid valve is grossly normal. Tricuspid valve regurgitation is trivial.  8. The aortic valve is tricuspid Aortic valve regurgitation was not visualized by color flow Doppler.  9. The pulmonic valve was grossly normal. Pulmonic valve regurgitation is not visualized by color flow Doppler. 10. Normal pulmonary artery systolic pressure. 11. The inferior vena cava is normal in size with <50% respiratory variability, suggesting right atrial pressure of 8 mmHg. (on vent) 12. The interatrial septum was not well visualized.  Patient Profile     SAFIYYAH VASCONEZ is a 73 y.o. female  with a hx of A.fib on Eliquis, Diastolic heart failure, Htn, HLd, IDDM, CKD 4 w/ hx of nephrectomy admit post-arrest, found to be in A.fib w/ RVR and worsening systolic heart failure  Assessment & Plan    1. Post Arrest-Care Presented to [facility] after arrest - ROSC after 10 minutes, 1 dose of Epi. No record of presenting rhythm. EKG w/ ST depressions on anterolateral leads, now resolved. HsTrop 48->715->2793. D-dimer 11.22. Procalcitonin 0.88 Wbc 32.1->36.2 Lactate 4.3->1.9. CT head w/o acute findings. Resp viral panel neg. COVID neg. Required levophed overnight but currently off pressors. Echocardiogram showing diffuse hypokinesis w/ significantly worsened EF (20-25%) compared to prior. EEG showing no seizures. - Off cooling - Levophed as needed to keep MAP >65 - C/w ceftriaxone - Will need LHC but with CKD4 will need to have goals of care discussion prior regarding likelihood of progression to dialysis - Will need EP eval for ICD  2. Atrial Fibrillation Prior hx of A.fib converted to sinus after cardioversion. Current rhythm A.fib w/ HR 100-120s. Will likely remain tachycardic until she recovers from underlying cause - C/w heparin gtt - C/w amiodarone gtt - Now maintaining sinus rhythm  3. Acute systolic heart failure Prior hx of diastolic heart failure now with new acute systolic heart failure. Echo showing global hypokinesis. Possibly sequelae of arrest. Cannot rule out ischemic event. As stated above, need ischemic eval with cath but likely will worsen kidney disease. - Repeat Echo at later date (08/07/19) - Daily weights, I&Os - C/w telemetry  4. Chronic Kidney Disease stage 4 Hx of L nephrectomy Baseline creatinine 2.7. Admit creatinine 2.64 -> 2.79. Worsening function with minimal urinary output. - Consider consulting nephro if no improvement over the weekend  - Trend renal fx - Avoid nephrotoxic meds when able    For questions or updates, please contact [place] Please  consult [email or web address] for contact info under Cardiology/STEMI.   Pixie Casino, MD, [facility], [place]  [facility]  Medical Director of the [facility] Diplomate of the American Board of Clinical Lipidology Attending Cardiologist  Direct Dial: [phone]  Fax: [phone]  Website:  [email or web address]  08/05/2019, 8:00 AM

## 2019-08-05 NOTE — Progress Notes (Signed)
Pharmacy Antibiotic Note  Jaclyn Day is a 73 y.o. female admitted on 08/03/2019 post cardiac arrest.  Pharmacy has been consulted for vancomycin dosing.  Patient post arrest with ecoli uti on admit. Patient has been receiving ceftriaxone for that indication. Preliminary blood culture showed GPC now identified as staph aureus. D/w CCM and will add vancomycin to antibiotic regimen. Susceptibilities are pending.   Patient with ckd stage IV now with acute kidney injury on top, will defer using AUC dosing at this time with changes in renal function ongoing.   Plan: Vancomycin 1750mg  IV x1 now. Consider q48 hour dosing based on after following up on renal function tomorrow.   Goal trough 15-20 mcg/mL.  Height: 5\' 6"  (167.6 cm) Weight: 174 lb 13.2 oz (79.3 kg) IBW/kg (Calculated) : 59.3  Temp (24hrs), Avg:98.5 F (36.9 C), Min:97.7 F (36.5 C), Max:99 F (37.2 C)  Recent Labs  Lab 08/03/19 1013 08/03/19 1147 08/03/19 1550 08/03/19 2300 08/04/19 0415 08/04/19 1146 08/04/19 2059 08/05/19 0521  WBC  --  32.1*  --   --  36.2*  --   --  31.5*  CREATININE 2.64*  --   --  2.73* 2.79* 3.16* 3.35* 3.55*  LATICACIDVEN 4.3*  --  1.6 1.9  --   --   --   --     Estimated Creatinine Clearance: 15.2 mL/min (A) (by C-G formula based on SCr of 3.55 mg/dL (H)).    Allergies  Allergen Reactions  . Codeine Other (See Comments)    Reaction not recalled by family- was told to never take this    Antimicrobials this admission: CTX 10/22>>  Microbiology results: 10/22 BCx: 1/2 Staph Aureus 10/22 UCx: ecoli - sen to ctx  MRSA PCR: neg  Thank you for allowing pharmacy to be a part of this patient's care.  Erin Hearing PharmD., BCPS Clinical Pharmacist 08/05/2019 2:46 PM

## 2019-08-05 NOTE — Progress Notes (Signed)
NAME:  Jaclyn Day, MRN:  [id number], DOB:  [birth year], LOS: 2 ADMISSION DATE:  08/03/2019, CONSULTATION DATE:  10/22 REFERRING MD:  Dr. Sherril Croon, CHIEF COMPLAINT:  Post arrest    Brief History   73yo female admitted after out of hospital cardiac arrest, unclear etiology CPR started by fire 10 min CPR with 1 Epi before ROSC. PCCM called for admission and need for targeted temperature management and ventilator management.   Of note, admitted at this facility 01/25/2019-01/31/2019 for new onset A-fib with development of diastolic congestive heart failure, during admission she underwent TEE and cardioversion with transition to SR.    Past Medical History  Congenital unilateral kidney  CKD stage IV (baseline creatinine 2.2-2.8) Hypertension  Persistent A-fib on anticoagulation  Chronic diastolic CHF Anemia  Cardioversion and TEE 01/31/2019  Significant Hospital Events   10/22 Admitted post arrest, intubated, initiation of targeted temperature management   Consults:  PCCM  Procedures:  ETT 10/22 >> Right femoral CVC 10/22 >>  Significant Diagnostic Tests:  10/22:  ECHO: LVEF: 20-25%, global hypokinesis, RV midly reduced function with normal size, mild MR, normal pulmonary pressures.  10/22: CTH without acute intracranial pathology  10/23 - + 800 ml I/O TTM:  Weaned off Levophed overnight ECHO: LVEF: 20-25%, global hypokinesis, RV midly reduced function with normal size, mild MR, normal pulmonary pressures.  Lactate cleared  [place] 10/23 - IMPRESSION: 1. Status post left nephrectomy 2. Echogenic right kidney with cortical thinning consistent with medical renal disease. No hydronephrosis. Cysts, fewer than 10 in the right kidney. 3. Right pleural effusion   Electronically Signed   By: Donavan Foil M.D.   On: 08/04/2019 16:12  Micro Data:  Blood culture 10/22 >> COVID 10/22 >> Negative RPP 10/22 >> Negative Urine 10/22 - E colii  Antimicrobials:  Rocephin 10/22   (high PCT and UTI symptoms preceding admit)  Interim history/subjective:    10/24 - 40% fio2, Oliguric v Anuric. On amio gtt, heparin gtt, neo gtt . Being weaned off neo gtt. Rewarmed to 37. Non purposeful only (not on sedation). Growing e colii in urine from admission  Objective   Blood pressure 117/61, pulse 70, temperature 98.8 F (37.1 C), temperature source Oral, resp. rate 12, height 5\' 6"  (1.676 m), weight 79.3 kg, SpO2 100 %. CVP:  [9 mmHg] 9 mmHg  Vent Mode: PRVC FiO2 (%):  [40 %-50 %] 40 % Set Rate:  [20 bmp] 20 bmp Vt Set:  [470 mL] 470 mL PEEP:  [5 cmH20] 5 cmH20 Plateau Pressure:  [17 cmH20-25 cmH20] 17 cmH20   Intake/Output Summary (Last 24 hours) at 08/05/2019 [id number] Last data filed at 08/05/2019 0700 Gross per 24 hour  Intake 1227.31 ml  Output 175 ml  Net 1052.31 ml   Filed Weights   08/03/19 0940 08/05/19 0600  Weight: 87.5 kg 79.3 kg  General Appearance:  Looks criticall ill OBESE - + Head:  Normocephalic, without obvious abnormality, atraumatic Eyes:  PERRL - yes, conjunctiva/corneas - muddy     Ears:  Normal external ear canals, both ears Nose:  G tube - no Throat:  ETT TUBE - yes , OG tube - yes Neck:  Supple,  No enlargement/tenderness/nodules Lungs: Clear to auscultation bilaterally, Ventilator   Synchrony - yes @ 40% Heart:  S1 and S2 normal, no murmur, CVP - no.  Pressors - no Abdomen:  Soft, no masses, no organomegaly Genitalia / Rectal:  Not done Extremities:  Extremities- intact with edema Skin:  ntact in  exposed areas . Sacral area - not examined Neurologic:  Sedation - none -> RASS - -3 . Moves all 4s - yes. CAM-ICU - unable to assess . Orientation - unable to assess     Resolved Hospital Problem list     Assessment & Plan:  This is a 73 yo F being admitted to ICU for  witnessed out of hospital cardiac arrest, unclear etiology, with field ROSC, resultant respiratory failure and encephalopathy. EKG shows anterolateral ST depression.     Out of hospital cardiac arrest - precipitated by E Colii UTI   Plan Cardiology following MAP goal > 65   Acute systolic heart failure - ECHO: LVEF: 20-25%, global hypokinesis, RV midly reduced function with normal size, mild MR, normal pulmonary pressures.     P: Cardiology following, LHC pending goals of care discussions in setting of CKD Not a candidate for BB/ACEI in setting of critical illness/code cool  Afib On home systemic anticoagulation with Eliquis P: Cardiology following Amiodarone drip Heparin drip  Acute Encephlopathy - concern for post arrest anoxia   08/05/2019 - non purposeful though no seizure.  Now rewarmed  Plan  - [place]ue arctic sun protocol  Acute hypoxic respiratory failure due to cardiac arrest +/- aspiration event. Intubated 10/22   08/05/2019 - > does not meet criteria for SBT/Extubation in setting of Acute Respiratory Failure due to acute encephalopathy, AKI   P: PRVC   Congential Unilateral Kidney with CKD III -Baseline Cr: 2.7 with post arrest AKI -    08/05/2019 - falling ur op and rising creat. Renal US without hydro  Plan Lasix 160mg  IV x 1 Renal cnsult of things get worse   E colii UTI  P: Abx: Rocephin   Mild low mag  Plan - goal Mag > 2g%    Best practice:  Diet: NPO.  TF once rewarmed Pain/Anxiety/Delirium protocol (if indicated): Y VAP protocol (if indicated): Y DVT prophylaxis: Heparin gtt  GI prophylaxis: PPI  Glucose control: SSI Q4H  Mobility: Bed rest  Code Status: FULL  Family Communication:Sister at bedside updated 08/05/2019  Disposition: ICU    ATTESTATION & SIGNATURE   The patient Jaclyn Day is critically ill with multiple organ systems failure and requires high complexity decision making for assessment and support, frequent evaluation and titration of therapies, application of advanced monitoring technologies and extensive interpretation of multiple databases.   Critical Care  Time devoted to patient care services described in this note is  30  Minutes. This time reflects time of care of this signee Dr Brand Males. This critical care time does not reflect procedure time, or teaching time or supervisory time of PA/NP/Med student/Med Resident etc but could involve care discussion time     Dr. Brand Males, M.D., [facility].C.P Pulmonary and Critical Care Medicine Staff Physician [place] [facility] Pager: [phone], If no answer or between  15:00h - 7:00h: call [phone]  08/05/2019 8:09 AM    LABS    PULMONARY Recent Labs  Lab 08/03/19 1058 08/04/19 0412 08/04/19 1135  PHART 7.315* 7.331* 7.344*  PCO2ART 36.5 32.1 30.9*  PO2ART 408.0* 186.0* 159.0*  HCO3 18.6* 17.3* 16.9*  TCO2 20* 18* 18*  O2SAT 100.0 100.0 99.0    CBC Recent Labs  Lab 08/03/19 1147  08/04/19 0415 08/04/19 1135 08/05/19 0521  HGB 11.4*   < > 11.9* 12.2 10.0*  HCT 38.0   < > 39.3 36.0 32.7*  WBC 32.1*  --  36.2*  --  31.5*  PLT 278  --  230  --  227   < > = values in this interval not displayed.    COAGULATION Recent Labs  Lab 08/03/19 1147 08/03/19 2000  INR 1.4* 1.5*    CARDIAC  No results for input(s): TROPONINI in the last 168 hours. No results for input(s): PROBNP in the last 168 hours.   CHEMISTRY Recent Labs  Lab 08/03/19 1013  08/03/19 2300  08/04/19 0415 08/04/19 1135 08/04/19 1146 08/04/19 2059 08/05/19 0521  NA 142   < > 140   < > 138 139 146* 139 141  K 3.7   < > 3.8   < > 3.7 3.6 3.9 4.0 3.8  CL 111  --  111  --  109  --  115* 108 111  CO2 18*  --  16*  --  15*  --  17* 15* 16*  GLUCOSE 145*  --  127*  --  116*  --  105* 112* 96  BUN 37*  --  41*  --  43*  --  45* 48* 48*  CREATININE 2.64*  --  2.73*  --  2.79*  --  3.16* 3.35* 3.55*  CALCIUM 8.2*  --  8.1*  --  8.0*  --  8.3* 8.2* 7.9*  MG 1.8  --   --   --  2.2  --   --   --  1.9  PHOS  --   --   --   --  4.5  --   --   --  4.9*   < >  = values in this interval not displayed.   Estimated Creatinine Clearance: 15.2 mL/min (A) (by C-G formula based on SCr of 3.55 mg/dL (H)).   LIVER Recent Labs  Lab 08/03/19 1013 08/03/19 1147 08/03/19 2000  AST 26  --   --   ALT 20  --   --   ALKPHOS 94  --   --   BILITOT 1.2  --   --   PROT 4.8*  --   --   ALBUMIN 2.3*  --   --   INR  --  1.4* 1.5*     INFECTIOUS Recent Labs  Lab 08/03/19 1013  08/03/19 1550 08/03/19 2300 08/04/19 0415 08/05/19 0521  LATICACIDVEN 4.3*  --  1.6 1.9  --   --   PROCALCITON  --    < > 2.79  --  6.46 7.98   < > = values in this interval not displayed.     ENDOCRINE CBG (last 3)  Recent Labs    08/05/19 0427 08/05/19 0754 08/05/19 0800  GLUCAP 86 69* 93     Anti-infectives (From admission, onward)   Start     Dose/Rate Route Frequency Ordered Stop   08/03/19 1300  cefTRIAXone (ROCEPHIN) 2 g in sodium chloride 0.9 % 100 mL IVPB     2 g 200 mL/hr over 30 Minutes Intravenous Every 24 hours 08/03/19 1251           IMAGING x48h  - image(s) personally visualized  -   highlighted in bold Ct Head Wo Contrast  Result Date: 08/03/2019 CLINICAL DATA:  Found unresponsive EXAM: CT HEAD WITHOUT CONTRAST TECHNIQUE: Contiguous axial images were obtained from the base of the skull through the vertex without intravenous contrast. COMPARISON:  None. FINDINGS: Brain: Mild chronic small vessel disease throughout the deep white matter. Mild cerebral  atrophy. No acute intracranial abnormality. Specifically, no hemorrhage, hydrocephalus, mass lesion, acute infarction, or significant intracranial injury. Vascular: No hyperdense vessel or unexpected calcification. Skull: No acute calvarial abnormality. Sinuses/Orbits: Visualized paranasal sinuses and mastoids clear. Orbital soft tissues unremarkable. Other: None IMPRESSION: Atrophy, chronic microvascular disease. No acute intracranial abnormality. Electronically Signed   By: Rolm Baptise M.D.   On:  08/03/2019 13:37   US Renal  Result Date: 08/04/2019 CLINICAL DATA:  Acute kidney injury EXAM: RENAL / URINARY TRACT ULTRASOUND COMPLETE COMPARISON:  08/25/2005 ultrasound, CT 11/06/2008 FINDINGS: Right Kidney: Renal measurements: 11.9 x 5.8 x 6.3 cm = volume: 226.6 mL. Increased cortical echogenicity with diffuse cortical thinning. No hydronephrosis. Cysts, fewer than 10 within the right kidney. Midpole cyst measures 2.9 x 2.5 by 2.2 cm. Upper pole cyst measures 1.9 x 1.2 by 1.2 cm. Lower pole cyst measures 1.7 x 1.4 x 1.6 cm. Left Kidney: Nonvisualized and surgically absent Bladder: Not visualized and presumed empty Other: Right pleural effusion IMPRESSION: 1. Status post left nephrectomy 2. Echogenic right kidney with cortical thinning consistent with medical renal disease. No hydronephrosis. Cysts, fewer than 10 in the right kidney. 3. Right pleural effusion Electronically Signed   By: Donavan Foil M.D.   On: 08/04/2019 16:12   Dg Chest Portable 1 View  Result Date: 08/03/2019 CLINICAL DATA:  ET tube placement EXAM: PORTABLE CHEST 1 VIEW COMPARISON:  01/25/2019 FINDINGS: Endotracheal tube is 2 cm above the carina. NG tube is in the stomach. Cardiomegaly. Bilateral perihilar and lower lobe opacities, right greater than left could reflect edema or infection. No visible effusions or acute bony abnormality. IMPRESSION: Endotracheal tube 2 cm above the carina. Cardiomegaly. Mild perihilar and lower lobe opacities, right greater than left could reflect edema or infection. Electronically Signed   By: Rolm Baptise M.D.   On: 08/03/2019 10:48

## 2019-08-05 NOTE — Progress Notes (Signed)
[place] for Heparin Indication: atrial fibrillation  Allergies  Allergen Reactions  . Codeine Other (See Comments)    Reaction not recalled by family- was told to never take this    Patient Measurements: Height: '5\' 6"'  (167.6 cm) Weight: 174 lb 13.2 oz (79.3 kg) IBW/kg (Calculated) : 59.3 Heparin Dosing Weight: 78 kg  Vital Signs: Temp: 98.8 F (37.1 C) (10/24 0751) Temp Source: Oral (10/24 0751) BP: 117/61 (10/24 0700) Pulse Rate: 70 (10/24 0700)  Labs: Recent Labs    08/03/19 1147 08/03/19 1239 08/03/19 1550 08/03/19 2000 08/03/19 2300  08/04/19 0415 08/04/19 0900 08/04/19 0934 08/04/19 1135 08/04/19 1146 08/04/19 2059 08/05/19 0521  HGB 11.4*  --   --   --   --    < > 11.9*  --   --  12.2  --   --  10.0*  HCT 38.0  --   --   --   --    < > 39.3  --   --  36.0  --   --  32.7*  PLT 278  --   --   --   --   --  230  --   --   --   --   --  227  APTT 23*  --   --   --  122*  --   --  89*  --   --   --   --   --   LABPROT 16.9*  --   --  17.8*  --   --   --   --   --   --   --   --   --   INR 1.4*  --   --  1.5*  --   --   --   --   --   --   --   --   --   HEPARINUNFRC  --  1.22*  --   --   --   --   --  1.14*  --   --   --   --   --   CREATININE  --   --   --   --  2.73*  --  2.79*  --   --   --  3.16* 3.35* 3.55*  TROPONINIHS 715*  --  2,793*  --   --   --   --   --  3,543*  --   --   --   --    < > = values in this interval not displayed.    Estimated Creatinine Clearance: 15.2 mL/min (A) (by C-G formula based on SCr of 3.55 mg/dL (H)).   Medical History: Past Medical History:  Diagnosis Date  . Anemia   . Chronic diastolic CHF (congestive heart failure) ([place])   . CKD (chronic kidney disease), stage IV ([place])   . Hypertension   . Persistent atrial fibrillation    a. dx 01/2019 s/p TEE DCCV.  Marland Kitchen Renal disorder   . Unilateral congenital absence of kidney     Medications:  Scheduled:  . atorvastatin  20 mg Per NG  tube Daily  . chlorhexidine gluconate (MEDLINE KIT)  15 mL Mouth Rinse BID  . Chlorhexidine Gluconate Cloth  6 each Topical Daily  . fentaNYL (SUBLIMAZE) injection  50 mcg Intravenous Once  . insulin aspart  0-9 Units Subcutaneous Q4H  . mouth rinse  15 mL Mouth Rinse 10 times per day  .  midazolam  1 mg Intravenous Once  . pantoprazole sodium  40 mg Per Tube QHS  . sodium chloride flush  10-40 mL Intracatheter Q12H   Infusions:  . sodium chloride 10 mL/hr at 08/03/19 1800  . sodium chloride 20 mL/hr at 08/05/19 0700  . amiodarone 30 mg/hr (08/05/19 0700)  . cefTRIAXone (ROCEPHIN)  IV Stopped (08/04/19 1447)  . fentaNYL infusion INTRAVENOUS Stopped (08/04/19 2305)  . heparin 900 Units/hr (08/05/19 0700)  . midazolam    . norepinephrine (LEVOPHED) Adult infusion Stopped (08/05/19 0105)  . phenylephrine (NEO-SYNEPHRINE) Adult infusion 20 mcg/min (08/05/19 0700)    Assessment: Pt is a 73 y/o female admitted s/p out of hospital cardiac arrest. ROSC was achieved and targeted temperature management was initiated. Pt has a history of A-fib (taking eliquis PTA), diastolic heart failure, HTN, HDL, IDDM, CKD. Pharmacy has been consulted to dose heparin for A-fib.  Baseline aPTT is 23 and heparin level is 1.22. Patient was on Eliquis PTA but last dose is unknown.   Heparin level still elevated and does not appear to be correlating, aptt at low end of goal at 66s. Hgb trending down to 10, plt wnl. No bleeding issues noted.   Goal of Therapy:  Heparin level 0.3-0.7 units/ml aPTT 66-102 seconds Monitor platelets by anticoagulation protocol: Yes   Plan:  Increase heparin to 1000 units/hr Daily HL, aptt cbc  Erin Hearing PharmD., BCPS Clinical Pharmacist 08/05/2019 7:55 AM

## 2019-08-05 NOTE — Progress Notes (Signed)
   Recent Labs  Lab 08/03/19 1013  08/04/19 0415 08/04/19 1135 08/04/19 1146 08/04/19 2059 08/05/19 0521 08/05/19 1526  NA 142   < > 138 139 146* 139 141 140  K 3.7   < > 3.7 3.6 3.9 4.0 3.8 3.6  CL 111   < > 109  --  115* 108 111 109  CO2 18*   < > 15*  --  17* 15* 16* 16*  GLUCOSE 145*   < > 116*  --  105* 112* 96 138*  BUN 37*   < > 43*  --  45* 48* 48* 51*  CREATININE 2.64*   < > 2.79*  --  3.16* 3.35* 3.55* 3.74*  CALCIUM 8.2*   < > 8.0*  --  8.3* 8.2* 7.9* 8.0*  MG 1.8  --  2.2  --   --   --  1.9  --   PHOS  --   --  4.5  --   --   --  4.9*  --    < > = values in this interval not displayed.    Plan  - check abg next 1h  - based on result - call renal +/- consider starting bic gtt    SIGNATURE    Dr. Brand Males, M.D., F.C.C.P,  Pulmonary and Critical Care Medicine Staff Physician, [place] Director - Interstitial Lung Disease  Program  Pulmonary [place] [facility] [place], [place], [address]  Pager: [phone], If no answer or between  15:00h - 7:00h: call [phone] Telephone: [phone]  5:52 PM 08/05/2019

## 2019-08-06 ENCOUNTER — Inpatient Hospital Stay (HOSPITAL_COMMUNITY): Payer: 59

## 2019-08-06 DIAGNOSIS — I480 Paroxysmal atrial fibrillation: Secondary | ICD-10-CM

## 2019-08-06 DIAGNOSIS — I469 Cardiac arrest, cause unspecified: Secondary | ICD-10-CM | POA: Diagnosis not present

## 2019-08-06 DIAGNOSIS — N179 Acute kidney failure, unspecified: Secondary | ICD-10-CM

## 2019-08-06 DIAGNOSIS — R6520 Severe sepsis without septic shock: Secondary | ICD-10-CM | POA: Diagnosis not present

## 2019-08-06 DIAGNOSIS — J9600 Acute respiratory failure, unspecified whether with hypoxia or hypercapnia: Secondary | ICD-10-CM | POA: Diagnosis not present

## 2019-08-06 DIAGNOSIS — A419 Sepsis, unspecified organism: Secondary | ICD-10-CM | POA: Diagnosis not present

## 2019-08-06 DIAGNOSIS — J9601 Acute respiratory failure with hypoxia: Secondary | ICD-10-CM | POA: Diagnosis not present

## 2019-08-06 LAB — CBC
HCT: 27.8 % — ABNORMAL LOW (ref 36.0–46.0)
HCT: 27.8 % — ABNORMAL LOW (ref 36.0–46.0)
Hemoglobin: 8.5 g/dL — ABNORMAL LOW (ref 12.0–15.0)
Hemoglobin: 8.5 g/dL — ABNORMAL LOW (ref 12.0–15.0)
MCH: 29.3 pg (ref 26.0–34.0)
MCH: 29.4 pg (ref 26.0–34.0)
MCHC: 30.6 g/dL (ref 30.0–36.0)
MCHC: 30.6 g/dL (ref 30.0–36.0)
MCV: 95.9 fL (ref 80.0–100.0)
MCV: 96.2 fL (ref 80.0–100.0)
Platelets: 158 10*3/uL (ref 150–400)
Platelets: 148 10*3/uL — ABNORMAL LOW (ref 150–400)
RBC: 2.9 MIL/uL — ABNORMAL LOW (ref 3.87–5.11)
RBC: 2.89 MIL/uL — ABNORMAL LOW (ref 3.87–5.11)
RDW: 17 % — ABNORMAL HIGH (ref 11.5–15.5)
RDW: 17 % — ABNORMAL HIGH (ref 11.5–15.5)
WBC: 20.6 10*3/uL — ABNORMAL HIGH (ref 4.0–10.5)
WBC: 16.3 10*3/uL — ABNORMAL HIGH (ref 4.0–10.5)
nRBC: 0 % (ref 0.0–0.2)
nRBC: 0 % (ref 0.0–0.2)

## 2019-08-06 LAB — CULTURE, BLOOD (ROUTINE X 2): Special Requests: ADEQUATE

## 2019-08-06 LAB — HEPATITIS B CORE ANTIBODY, TOTAL: Hep B Core Total Ab: NONREACTIVE

## 2019-08-06 LAB — HEPATITIS B SURFACE ANTIBODY,QUALITATIVE: Hep B S Ab: NONREACTIVE

## 2019-08-06 LAB — HEPARIN LEVEL (UNFRACTIONATED): Heparin Unfractionated: 0.78 IU/mL — ABNORMAL HIGH (ref 0.30–0.70)

## 2019-08-06 LAB — HEPATITIS B SURFACE ANTIGEN: Hepatitis B Surface Ag: NONREACTIVE

## 2019-08-06 LAB — MAGNESIUM: Magnesium: 2.3 mg/dL (ref 1.7–2.4)

## 2019-08-06 LAB — GLUCOSE, CAPILLARY
Glucose-Capillary: 110 mg/dL — ABNORMAL HIGH (ref 70–99)
Glucose-Capillary: 113 mg/dL — ABNORMAL HIGH (ref 70–99)
Glucose-Capillary: 115 mg/dL — ABNORMAL HIGH (ref 70–99)
Glucose-Capillary: 90 mg/dL (ref 70–99)
Glucose-Capillary: 98 mg/dL (ref 70–99)
Glucose-Capillary: 98 mg/dL (ref 70–99)

## 2019-08-06 LAB — BASIC METABOLIC PANEL
Anion gap: 12 (ref 5–15)
BUN: 57 mg/dL — ABNORMAL HIGH (ref 8–23)
CO2: 18 mmol/L — ABNORMAL LOW (ref 22–32)
Calcium: 7.8 mg/dL — ABNORMAL LOW (ref 8.9–10.3)
Chloride: 109 mmol/L (ref 98–111)
Creatinine, Ser: 3.82 mg/dL — ABNORMAL HIGH (ref 0.44–1.00)
GFR calc Af Amer: 13 mL/min — ABNORMAL LOW (ref >60)
GFR calc non Af Amer: 11 mL/min — ABNORMAL LOW (ref >60)
Glucose, Bld: 105 mg/dL — ABNORMAL HIGH (ref 70–99)
Potassium: 3.2 mmol/L — ABNORMAL LOW (ref 3.5–5.1)
Sodium: 139 mmol/L (ref 135–145)

## 2019-08-06 LAB — APTT: aPTT: 92 seconds — ABNORMAL HIGH (ref 24–36)

## 2019-08-06 LAB — PHOSPHORUS: Phosphorus: 4.1 mg/dL (ref 2.5–4.6)

## 2019-08-06 MED ORDER — HEPARIN SODIUM (PORCINE) 1000 UNIT/ML DIALYSIS
1000.0000 [IU] | INTRAMUSCULAR | Status: DC | PRN
  Administered 2019-08-06: 16:00:00 1000 [IU] via INTRAVENOUS_CENTRAL
  Filled 2019-08-06 (×4): qty 1

## 2019-08-06 MED ORDER — HEPARIN SODIUM (PORCINE) 1000 UNIT/ML IJ SOLN
INTRAMUSCULAR | Status: AC
  Administered 2019-08-06: 16:00:00 1000 [IU] via INTRAVENOUS_CENTRAL
  Filled 2019-08-06: qty 4

## 2019-08-06 MED ORDER — HEPARIN SODIUM (PORCINE) 1000 UNIT/ML IJ SOLN
2400.0000 [IU] | Freq: Once | INTRAMUSCULAR | Status: AC
  Administered 2019-08-06: 2400 [IU] via INTRAVENOUS

## 2019-08-06 MED ORDER — FENTANYL CITRATE (PF) 100 MCG/2ML IJ SOLN
25.0000 ug | INTRAMUSCULAR | Status: DC | PRN
  Administered 2019-08-06: 100 ug via INTRAVENOUS
  Administered 2019-08-07: 75 ug via INTRAVENOUS
  Administered 2019-08-07: 100 ug via INTRAVENOUS
  Administered 2019-08-07 (×2): 50 ug via INTRAVENOUS
  Administered 2019-08-08 (×2): 100 ug via INTRAVENOUS
  Administered 2019-08-08: 75 ug via INTRAVENOUS
  Administered 2019-08-08 – 2019-08-09 (×6): 100 ug via INTRAVENOUS
  Administered 2019-08-09: 50 ug via INTRAVENOUS
  Administered 2019-08-09 – 2019-08-10 (×4): 100 ug via INTRAVENOUS
  Filled 2019-08-06 (×21): qty 2

## 2019-08-06 MED ORDER — CEFAZOLIN SODIUM-DEXTROSE 1-4 GM/50ML-% IV SOLN
1.0000 g | INTRAVENOUS | Status: DC
  Administered 2019-08-06 – 2019-08-09 (×4): 1 g via INTRAVENOUS
  Filled 2019-08-06 (×6): qty 50

## 2019-08-06 MED ORDER — SODIUM CHLORIDE 0.9 % IV SOLN: 100.0000 mL | INTRAVENOUS | Status: DC | PRN

## 2019-08-06 MED ORDER — ALTEPLASE 2 MG IJ SOLR: 2.0000 mg | Freq: Once | INTRAMUSCULAR | Status: DC | PRN

## 2019-08-06 MED ORDER — MIDAZOLAM HCL 2 MG/2ML IJ SOLN
1.0000 mg | INTRAMUSCULAR | Status: DC | PRN
  Administered 2019-08-06 – 2019-08-07 (×2): 2 mg via INTRAVENOUS
  Filled 2019-08-06 (×2): qty 2

## 2019-08-06 NOTE — Consult Note (Signed)
[facility]    NEPHROLOGY CONSULTATION NOTE  PATIENT ID:  Jaclyn Day, DOB:  02-09-1946  HPI: The patient is a 73 y.o. year old female admitted for an out of hospital cardiac arrest of unclear etiology.  She had 10 minutes of CPR with 1 epi before ROSC.  She was admitted and placed on hypothermia protocol.  She had a recent history of an admission in April for new onset atrial fibrillation and diastolic congestive heart failure and underwent TEE cardioversion with transition to sinus rhythm.  History obtained is via the history and physical.  The patient is unable to provide any other associated history.  Since her admission, she has made minimal urine in the past 3 days.  Renal consultation has been called for acute kidney injury.  Her baseline GFR is around 19 from April 2020.  Past Medical History:  Diagnosis Date  . Anemia   . Chronic diastolic CHF (congestive heart failure) ([place])   . CKD (chronic kidney disease), stage IV ([place])   . Hypertension   . Persistent atrial fibrillation    a. dx 01/2019 s/p TEE DCCV.  Marland Kitchen Renal disorder   . Unilateral congenital absence of kidney     Past Surgical History:  Procedure Laterality Date  . CARDIOVERSION N/A 01/31/2019   Procedure: CARDIOVERSION;  Surgeon: Lelon Perla, MD;  Location: [facility] ENDOSCOPY;  Service: Cardiovascular;  Laterality: N/A;  . TEE WITHOUT CARDIOVERSION N/A 01/31/2019   Procedure: TRANSESOPHAGEAL ECHOCARDIOGRAM (TEE);  Surgeon: Lelon Perla, MD;  Location[facility]COPY;  Service: Cardiovascular;  Laterality: N/A;    Family History  Problem Relation Age of Onset  . Atrial fibrillation Father   . Atrial fibrillation Brother     Social History   Tobacco Use  . Smoking status: Never Smoker  . Smokeless tobacco: Never Used  Substance Use Topics  . Alcohol use: Not Currently  . Drug use: Not Currently    REVIEW OF SYSTEMS: Unobtainable secondary to mental status   PHYSICAL EXAM:  Vitals:    08/06/19 0754 08/06/19 0800  BP:  126/73  Pulse:  61  Resp:  (!) 21  Temp: 98.2 F (36.8 C) 98.4 F (36.9 C)  SpO2:  98%   I/O last 3 completed shifts: In: 3611.1 [I.V.:2130.3; NG/GT:672; IV Piggyback:808.8] Out: 190 [Urine:40; Emesis/NG output:150]   General: Lethargic but with some purposeful movements, NAD HEENT: MMM  AT anicteric sclera Neck:  No JVD, no adenopathy CV:  Heart RRR  Lungs:  L/S CTA bilaterally Abd:  abd SNT/ND with normal BS GU:  Bladder non-palpable, bladder scan with less than 150 mL Extremities: +1 bilateral lower extremity edema Skin:  No skin rash Psych:  normal mood and affect Neuro:  no focal deficits   CURRENT MEDICATIONS:  . atorvastatin  20 mg Per NG tube Daily  . chlorhexidine gluconate (MEDLINE KIT)  15 mL Mouth Rinse BID  . Chlorhexidine Gluconate Cloth  6 each Topical Daily  . feeding supplement (PRO-STAT SUGAR FREE 64)  30 mL Per Tube TID  . feeding supplement (VITAL HIGH PROTEIN)  1,000 mL Per Tube Q24H  . insulin aspart  0-9 Units Subcutaneous Q4H  . mouth rinse  15 mL Mouth Rinse 10 times per day  . pantoprazole sodium  40 mg Per Tube QHS  . sodium chloride flush  10-40 mL Intracatheter Q12H  . vancomycin variable dose per unstable renal function (pharmacist dosing)   Does not apply See admin instructions  HOME MEDICATIONS:  Prior to Admission medications   Medication Sig Start Date End Date Taking? Authorizing Provider  acetaminophen (TYLENOL) 500 MG tablet Take 500-1,000 mg by mouth every 8 (eight) hours as needed for mild pain or headache.    Yes [provider]  amLODipine (NORVASC) 10 MG tablet Take 10 mg by mouth daily. 12/30/18  Yes [provider]  apixaban (ELIQUIS) 5 MG TABS tablet Take 1 tablet (5 mg total) by mouth 2 (two) times daily. 01/31/19  Yes Wilber Oliphant, MD  ARIPiprazole (ABILIFY) 10 MG tablet Take 10 mg by mouth 2 (two) times daily.  12/27/18  Yes [provider]  atorvastatin  (LIPITOR) 20 MG tablet Take 20 mg by mouth daily.   Yes [provider]  brimonidine (ALPHAGAN) 0.15 % ophthalmic solution Place 1 drop into the left eye 2 (two) times daily.    Yes [provider]  divalproex (DEPAKOTE) 500 MG DR tablet Take 1,000 mg by mouth at bedtime. 12/11/18  Yes [provider]  dorzolamide-timolol (COSOPT) 22.3-6.8 MG/ML ophthalmic solution Place 1 drop into both eyes 2 (two) times daily. 01/24/19  Yes [provider]  DULoxetine (CYMBALTA) 60 MG capsule Take 60 mg by mouth daily. 01/01/19  Yes [provider]  furosemide (LASIX) 40 MG tablet Take 1 tablet (40 mg total) by mouth daily. 02/01/19  Yes Wilber Oliphant, MD  hydrALAZINE (APRESOLINE) 25 MG tablet Take 25 mg by mouth 2 (two) times daily. 05/09/19  Yes [provider]  metoprolol succinate (TOPROL-XL) 50 MG 24 hr tablet Take 100 mg by mouth daily. 01/23/19  Yes [provider]  traMADol (ULTRAM) 50 MG tablet Take 50 mg by mouth 2 (two) times daily as needed (for severe knee pain).  07/17/19  Yes [provider]       LABS:  CBC Latest Ref Rng & Units 08/06/2019 08/05/2019 08/05/2019  WBC 4.0 - 10.5 K/uL 20.6(H) - 31.5(H)  Hemoglobin 12.0 - 15.0 g/dL 8.5(L) 9.2(L) 10.0(L)  Hematocrit 36.0 - 46.0 % 27.8(L) 27.0(L) 32.7(L)  Platelets 150 - 400 K/uL 158 - 227    CMP Latest Ref Rng & Units 08/06/2019 08/05/2019 08/05/2019  Glucose 70 - 99 mg/dL 105(H) - 138(H)  BUN 8 - 23 mg/dL 57(H) - 51(H)  Creatinine 0.44 - 1.00 mg/dL 3.82(H) - 3.74(H)  Sodium 135 - 145 mmol/L 139 138 140  Potassium 3.5 - 5.1 mmol/L 3.2(L) 3.5 3.6  Chloride 98 - 111 mmol/L 109 - 109  CO2 22 - 32 mmol/L 18(L) - 16(L)  Calcium 8.9 - 10.3 mg/dL 7.8(L) - 8.0(L)  Total Protein 6.5 - 8.1 g/dL - - -  Total Bilirubin 0.3 - 1.2 mg/dL - - -  Alkaline Phos 38 - 126 U/L - - -  AST 15 - 41 U/L - - -  ALT 0 - 44 U/L - - -    Lab Results  Component Value Date   CALCIUM 7.8 (L)  08/06/2019   CAION 1.18 08/05/2019   PHOS 4.1 08/06/2019       Component Value Date/Time   COLORURINE BROWN (A) 08/05/2019 2018   APPEARANCEUR TURBID (A) 08/05/2019 2018   LABSPEC RESULTS UNAVAILABLE DUE TO INTERFERING SUBSTANCE 08/05/2019 2018   PHURINE RESULTS UNAVAILABLE DUE TO INTERFERING SUBSTANCE 08/05/2019 2018   GLUCOSEU RESULTS UNAVAILABLE DUE TO INTERFERING SUBSTANCE (A) 08/05/2019 2018   HGBUR RESULTS UNAVAILABLE DUE TO INTERFERING SUBSTANCE (A) 08/05/2019 2018   BILIRUBINUR RESULTS UNAVAILABLE DUE TO INTERFERING SUBSTANCE (A)  08/05/2019 2018   KETONESUR RESULTS UNAVAILABLE DUE TO INTERFERING SUBSTANCE (A) 08/05/2019 2018   PROTEINUR RESULTS UNAVAILABLE DUE TO INTERFERING SUBSTANCE (A) 08/05/2019 2018   UROBILINOGEN 0.2 05/07/2009 1520   NITRITE RESULTS UNAVAILABLE DUE TO INTERFERING SUBSTANCE (A) 08/05/2019 2018   LEUKOCYTESUR RESULTS UNAVAILABLE DUE TO INTERFERING SUBSTANCE (A) 08/05/2019 2018      Component Value Date/Time   PHART 7.362 08/05/2019 2002   PCO2ART 28.9 (L) 08/05/2019 2002   PO2ART 176.0 (H) 08/05/2019 2002   HCO3 16.5 (L) 08/05/2019 2002   TCO2 17 (L) 08/05/2019 2002   ACIDBASEDEF 8.0 (H) 08/05/2019 2002   O2SAT 100.0 08/05/2019 2002       Component Value Date/Time   IRON 23 (L) 01/28/2019 1325   TIBC 301 01/28/2019 1325   FERRITIN 45 01/28/2019 1325   IRONPCTSAT 8 (L) 01/28/2019 1325       ASSESSMENT/PLAN:     1.  Chronic kidney disease stage IV with a baseline GFR around 19-20.  Chronic kidney disease likely on the basis of longstanding hypertension in the setting of a solitary kidney.  2.  Acute kidney injury.  Renal ultrasound with no evidence of obstruction.  She is status post left nephrectomy.  Likely ATN in the setting of shock, cardiac arrest, and E. coli sepsis.  Minimal urine output.  We will plan for dialysis today.  Discussed with next of kin and they are agreeable.  We will ask CCM to place dialysis catheter.  3.  Status post  cardiac arrest.  Hypothermia protocol finishing.  4.  Non-anion gap metabolic acidosis.  Likely secondary to acute kidney injury.  Should improve with dialysis.  5.  Hypokalemia.  Will improve with dialysis.    [place], DO, [place]

## 2019-08-06 NOTE — Progress Notes (Addendum)
NAME:  Jaclyn Day, MRN:  [id number], DOB:  [birth year], LOS: 3 ADMISSION DATE:  08/03/2019, CONSULTATION DATE:  10/22 REFERRING MD:  Dr. Sherril Croon, CHIEF COMPLAINT:  Post arrest    Brief History   73yo female admitted after out of hospital cardiac arrest, unclear etiology CPR started by fire 10 min CPR with 1 Epi before ROSC. PCCM called for admission and need for targeted temperature management and ventilator management.   Of note, admitted at this facility 01/25/2019-01/31/2019 for new onset A-fib with development of diastolic congestive heart failure, during admission she underwent TEE and cardioversion with transition to SR.    Past Medical History  Congenital unilateral kidney  CKD stage IV (baseline creatinine 2.2-2.8) Hypertension  Persistent A-fib on anticoagulation  Chronic diastolic CHF Anemia  Cardioversion and TEE 01/31/2019  Significant Hospital Events   10/22 Admitted post arrest, intubated, initiation of targeted temperature management   10/24 - 40% fio2, Oliguric v Anuric. On amio gtt, heparin gtt, neo gtt . Being weaned off neo gtt. Rewarmed to 37. Non purposeful only (not on sedation). Growing e colii in urine from admission   Consults:  PCCM  Procedures:  ETT 10/22 >> Right femoral CVC 10/22 >>  Significant Diagnostic Tests:  10/22:  ECHO: LVEF: 20-25%, global hypokinesis, RV midly reduced function with normal size, mild MR, normal pulmonary pressures.  10/22: CTH without acute intracranial pathology  10/23 - + 800 ml I/O TTM:  Weaned off Levophed overnight ECHO: LVEF: 20-25%, global hypokinesis, RV midly reduced function with normal size, mild MR, normal pulmonary pressures.  Lactate cleared  [place] 10/23 - IMPRESSION: 1. Status post left nephrectomy 2. Echogenic right kidney with cortical thinning consistent with medical renal disease. No hydronephrosis. Cysts, fewer than 10 in the right kidney. 3. Right pleural effusion   Electronically Signed  By: Donavan Foil M.D.   On: 08/04/2019 16:12  Micro Data:  Blood culture 10/22 >> staph nos COVID 10/22 >> Negative RPP 10/22 >> Negative Urine 10/22 - E colii  Antimicrobials:  Rocephin 10/22  (high PCT and UTI symptoms preceding admit) Vanc (staph in blood ) 10/24 >>  Interim history/subjective:   10/25 - staph identified in blood and vanc started yesterday. Still with poor Ur OP. Foley leak suspected and foley stopped. No new foley . RT says patient followed commands and wants to do SBT. On amio gtt. Off leveophed an dneo. On Heparin gtt  Objective   Blood pressure 113/65, pulse 66, temperature 99 F (37.2 C), temperature source Core, resp. rate 13, height 5\' 6"  (1.676 m), weight 81.9 kg, SpO2 100 %. CVP:  [10 mmHg] 10 mmHg  Vent Mode: PRVC FiO2 (%):  [40 %] 40 % Set Rate:  [20 bmp] 20 bmp Vt Set:  [470 mL] 470 mL PEEP:  [5 cmH20] 5 cmH20 Plateau Pressure:  [15 cmH20-16 cmH20] 16 cmH20   Intake/Output Summary (Last 24 hours) at 08/06/2019 0751 Last data filed at 08/06/2019 0700 Gross per 24 hour  Intake 2692.09 ml  Output 20 ml  Net 2672.09 ml   Filed Weights   08/03/19 0940 08/05/19 0600 08/06/19 0217  Weight: 87.5 kg 79.3 kg 81.9 kg     General Appearance:  Looks criticall ill OBESE - + Head:  Normocephalic, without obvious abnormality, atraumatic Eyes:  PERRL - yes, conjunctiva/corneas - muddy     Ears:  Normal external ear canals, both ears Nose:  G tube - no Throat:  ETT TUBE - yes , OG tube -  yes on TF Neck:  Supple,  No enlargement/tenderness/nodules Lungs: Clear to auscultation bilaterally, Ventilator   Synchrony - yes Heart:  S1 and S2 normal, no murmur, CVP - no.  Pressors - no Abdomen:  Soft, no masses, no organomegaly Genitalia / Rectal:  Not done Extremities:  Extremities- intact with edema Skin:  ntact in exposed areas . Sacral area - not examined Neurologic:  Sedation - none -> RASS - -2 . Moves all 4s - yes weakly. CAM-ICU - cannot assess .  Orientation - cannot assess. Tries to open eyes more than 08/05/71. Squeezed fingers to command. Per sister - recognized sister's voice        Resolved Hospital Problem list     Assessment & Plan:  This is a 73 yo F being admitted to ICU for  witnessed out of hospital cardiac arrest, unclear etiology, with field ROSC, resultant respiratory failure and encephalopathy. EKG shows anterolateral ST depression.   Out of hospital cardiac arrest - precipitated by E Colii UTI +/- staph aureus bacteremia   Plan Cardiology following MAP goal > 65   Acute systolic heart failure (baseline normal April 2020) - ECHO 08/03/2019: LVEF: 20-25%, global hypokinesis, RV midly reduced function with normal size, mild MR, normal pulmonary pressures.     P: Cardiology following, LHC pending goals of care discussions in setting of CKD Not a candidate for BB/ACEI in setting of critical illness/code cool  Afib - On home systemic anticoagulation with Eliquis  - On 08/06/2019 - HR 65/min. In and out of A Fib and Sinus  P: Cardiology following Amiodarone drip Heparin drip  Acute Encephlopathy - concern for post arrest anoxia - s/p 36C TTM - rewarmed 08/05/2019    08/06/2019 - improving encephalopathy. Able to follow some simple commands  Plan  -Ctninue arctic sun protocol; Normothermia arm - prn sedation  Acute hypoxic respiratory failure due to cardiac arrest +/- aspiration event. Intubated 10/22  08/06/2019 - > does YES meet criteria for SBT but not for Extubation in setting of Acute Respiratory Failure due to encephalopathy  P: PRVC   Congential Unilateral Kidney with CKD III -Baseline Cr: 2.7-/29 in April 2020. Currently with post arrest AKI -    08/06/2019 - falling ur op and rising creat. Renal US without hydro this admit. Foley out today. No response to lasix  Plan Replace foley and if no Ur Op - call renal Await bmet from 08/06/2019 AM    E colii UTI Staph aureus  bacteremia  P: Abx: Rocephin Abx: Vanc ? Needs TEE - Dr Debara Pickett aware - await further culture report   Mild low mag  Plan - goal Mag > 2g%    Best practice:  Diet: NPO.  TF once rewarmed Pain/Anxiety/Delirium protocol (if indicated): Y VAP protocol (if indicated): Y DVT prophylaxis: Heparin gtt  GI prophylaxis: PPI  Glucose control: SSI Q4H  Mobility: Bed rest  Code Status: FULL  Family Communication:Sister at bedside updated 08/06/2019  Disposition: ICU    ATTESTATION & SIGNATURE   The patient KERIE HOCKLEY is critically ill with multiple organ systems failure and requires high complexity decision making for assessment and support, frequent evaluation and titration of therapies, application of advanced monitoring technologies and extensive interpretation of multiple databases.   Critical Care Time devoted to patient care services described in this note is  30  Minutes. This time reflects time of care of this signee Dr Brand Males. This critical care time does not reflect procedure time, or  teaching time or supervisory time of PA/NP/Med student/Med Resident etc but could involve care discussion time     Dr. Brand Males, M.D., [facility].C.P Pulmonary and Critical Care Medicine Staff Physician [place] [facility] Pager: [phone], If no answer or between  15:00h - 7:00h: call [phone]  08/06/2019 7:51 AM    LABS    PULMONARY Recent Labs  Lab 08/03/19 1058 08/04/19 0412 08/04/19 1135 08/05/19 2002  PHART 7.315* 7.331* 7.344* 7.362  PCO2ART 36.5 32.1 30.9* 28.9*  PO2ART 408.0* 186.0* 159.0* 176.0*  HCO3 18.6* 17.3* 16.9* 16.5*  TCO2 20* 18* 18* 17*  O2SAT 100.0 100.0 99.0 100.0    CBC Recent Labs  Lab 08/04/19 0415  08/05/19 0521 08/05/19 2002 08/06/19 0203  HGB 11.9*   < > 10.0* 9.2* 8.5*  HCT 39.3   < > 32.7* 27.0* 27.8*  WBC 36.2*  --  31.5*  --  20.6*  PLT 230  --  227  --  158   < > =  values in this interval not displayed.    COAGULATION Recent Labs  Lab 08/03/19 1147 08/03/19 2000  INR 1.4* 1.5*    CARDIAC  No results for input(s): TROPONINI in the last 168 hours. No results for input(s): PROBNP in the last 168 hours.   CHEMISTRY Recent Labs  Lab 08/03/19 1013  08/04/19 0415  08/04/19 1146 08/04/19 2059 08/05/19 0521 08/05/19 1526 08/05/19 2002 08/06/19 0203  NA 142   < > 138   < > 146* 139 141 140 138  --   K 3.7   < > 3.7   < > 3.9 4.0 3.8 3.6 3.5  --   CL 111   < > 109  --  115* 108 111 109  --   --   CO2 18*   < > 15*  --  17* 15* 16* 16*  --   --   GLUCOSE 145*   < > 116*  --  105* 112* 96 138*  --   --   BUN 37*   < > 43*  --  45* 48* 48* 51*  --   --   CREATININE 2.64*   < > 2.79*  --  3.16* 3.35* 3.55* 3.74*  --   --   CALCIUM 8.2*   < > 8.0*  --  8.3* 8.2* 7.9* 8.0*  --   --   MG 1.8  --  2.2  --   --   --  1.9  --   --  2.3  PHOS  --   --  4.5  --   --   --  4.9*  --   --  4.1   < > = values in this interval not displayed.   Estimated Creatinine Clearance: 14.7 mL/min (A) (by C-G formula based on SCr of 3.74 mg/dL (H)).   LIVER Recent Labs  Lab 08/03/19 1013 08/03/19 1147 08/03/19 2000  AST 26  --   --   ALT 20  --   --   ALKPHOS 94  --   --   BILITOT 1.2  --   --   PROT 4.8*  --   --   ALBUMIN 2.3*  --   --   INR  --  1.4* 1.5*     INFECTIOUS Recent Labs  Lab 08/03/19 1013  08/03/19 1550 08/03/19 2300 08/04/19 0415 08/05/19 0521  LATICACIDVEN 4.3*  --  1.6 1.9  --   --   PROCALCITON  --    < > 2.79  --  6.46 7.98   < > = values in this interval not displayed.     ENDOCRINE CBG (last 3)  Recent Labs    08/05/19 1943 08/05/19 2322 08/06/19 0309  GLUCAP 118* 100* 115*     Anti-infectives (From admission, onward)   Start     Dose/Rate Route Frequency Ordered Stop   08/05/19 1530  vancomycin (VANCOCIN) 1,750 mg in sodium chloride 0.9 % 500 mL IVPB     1,750 mg 250 mL/hr over 120 Minutes Intravenous  Once  08/05/19 1444 08/05/19 1736   08/05/19 1443  vancomycin variable dose per unstable renal function (pharmacist dosing)      Does not apply See admin instructions 08/05/19 1444     08/03/19 1300  cefTRIAXone (ROCEPHIN) 2 g in sodium chloride 0.9 % 100 mL IVPB     2 g 200 mL/hr over 30 Minutes Intravenous Every 24 hours 08/03/19 1251           IMAGING x48h  - image(s) personally visualized  -   highlighted in bold US Renal  Result Date: 08/04/2019 CLINICAL DATA:  Acute kidney injury EXAM: RENAL / URINARY TRACT ULTRASOUND COMPLETE COMPARISON:  08/25/2005 ultrasound, CT 11/06/2008 FINDINGS: Right Kidney: Renal measurements: 11.9 x 5.8 x 6.3 cm = volume: 226.6 mL. Increased cortical echogenicity with diffuse cortical thinning. No hydronephrosis. Cysts, fewer than 10 within the right kidney. Midpole cyst measures 2.9 x 2.5 by 2.2 cm. Upper pole cyst measures 1.9 x 1.2 by 1.2 cm. Lower pole cyst measures 1.7 x 1.4 x 1.6 cm. Left Kidney: Nonvisualized and surgically absent Bladder: Not visualized and presumed empty Other: Right pleural effusion IMPRESSION: 1. Status post left nephrectomy 2. Echogenic right kidney with cortical thinning consistent with medical renal disease. No hydronephrosis. Cysts, fewer than 10 in the right kidney. 3. Right pleural effusion Electronically Signed   By: Donavan Foil M.D.   On: 08/04/2019 16:12   Dg Chest Port 1 View  Result Date: 08/05/2019 CLINICAL DATA:  Endotracheal tube placement EXAM: PORTABLE CHEST 1 VIEW COMPARISON:  Chest radiograph dated 08/03/2019. FINDINGS: An endotracheal tube terminates 2.5 cm from the carina. An enteric tube enters the stomach and terminates below the field of view. Deferred related or pads overlie the lower chest. The heart remains enlarged. Vascular calcifications are seen in the aortic arch. Bilateral perihilar and lower lung predominant airspace opacities have increased from prior exam. There is no pleural effusion or pneumothorax.  IMPRESSION: 1. Endotracheal tube terminates 2.5 cm from the carina. 2. Increased bilateral airspace opacities. Electronically Signed   By: Zerita Boers M.D.   On: 08/05/2019 13:07

## 2019-08-06 NOTE — Procedures (Signed)
Hemodialysis Catheter Insertion Procedure Note Jaclyn Day [id number] 12-07-1945  Procedure: Insertion of Hemodialysis Catheter Indications: Dialysis Access   Procedure Details Consent: Risks of procedure as well as the alternatives and risks of each were explained to the (patient/caregiver).  Consent for procedure obtained. Time Out: Verified patient identification, verified procedure, site/side was marked, verified correct patient position, special equipment/implants available, medications/allergies/relevent history reviewed, required imaging and test results available.  Performed  Maximum sterile technique was used including antiseptics, cap, gloves, gown, hand hygiene, mask and sheet. Skin prep: Chlorhexidine; local anesthetic administered Trialysis Cath hemodialysis catheter was inserted into right internal jugular vein using the Seldinger technique.  Evaluation Blood flow good Complications: No apparent complications Patient did tolerate procedure well. Chest X-ray ordered to verify placement.  CXR: pending.   Hayden Pedro, AGACNP-BC [facility]  PCCM Pgr: [phone]

## 2019-08-06 NOTE — Progress Notes (Signed)
[place] for Heparin Indication: atrial fibrillation  Allergies  Allergen Reactions  . Codeine Other (See Comments)    Reaction not recalled by family- was told to never take this    Patient Measurements: Height: '5\' 6"'  (167.6 cm) Weight: 180 lb 8.9 oz (81.9 kg) IBW/kg (Calculated) : 59.3 Heparin Dosing Weight: 78 kg  Vital Signs: Temp: 98.4 F (36.9 C) (10/25 0800) Temp Source: Core (10/25 0800) BP: 126/73 (10/25 0800) Pulse Rate: 61 (10/25 0800)  Labs: Recent Labs    08/03/19 1147  08/03/19 1550 08/03/19 2000  08/04/19 0415 08/04/19 0900 08/04/19 0934  08/05/19 0521 08/05/19 0802 08/05/19 1526 08/05/19 2002 08/06/19 0203 08/06/19 0854  HGB 11.4*  --   --   --    < > 11.9*  --   --    < > 10.0*  --   --  9.2* 8.5*  --   HCT 38.0  --   --   --    < > 39.3  --   --    < > 32.7*  --   --  27.0* 27.8*  --   PLT 278  --   --   --   --  230  --   --   --  227  --   --   --  158  --   APTT 23*  --   --   --    < >  --  89*  --   --   --  66*  --   --  92*  --   LABPROT 16.9*  --   --  17.8*  --   --   --   --   --   --   --   --   --   --   --   INR 1.4*  --   --  1.5*  --   --   --   --   --   --   --   --   --   --   --   HEPARINUNFRC  --    < >  --   --   --   --  1.14*  --   --   --  0.95*  --   --  0.78*  --   CREATININE  --   --   --   --    < > 2.79*  --   --    < > 3.55*  --  3.74*  --   --  3.82*  TROPONINIHS 715*  --  2,793*  --   --   --   --  3,543*  --   --   --   --   --   --   --    < > = values in this interval not displayed.    Estimated Creatinine Clearance: 14.4 mL/min (A) (by C-G formula based on SCr of 3.82 mg/dL (H)).   Medical History: Past Medical History:  Diagnosis Date  . Anemia   . Chronic diastolic CHF (congestive heart failure) ([place])   . CKD (chronic kidney disease), stage IV ([place])   . Hypertension   . Persistent atrial fibrillation    a. dx 01/2019 s/p TEE DCCV.  Marland Kitchen Renal disorder   .  Unilateral congenital absence of kidney     Medications:  Scheduled:  . atorvastatin  20 mg Per NG tube Daily  .  chlorhexidine gluconate (MEDLINE KIT)  15 mL Mouth Rinse BID  . Chlorhexidine Gluconate Cloth  6 each Topical Daily  . feeding supplement (PRO-STAT SUGAR FREE 64)  30 mL Per Tube TID  . feeding supplement (VITAL HIGH PROTEIN)  1,000 mL Per Tube Q24H  . insulin aspart  0-9 Units Subcutaneous Q4H  . mouth rinse  15 mL Mouth Rinse 10 times per day  . pantoprazole sodium  40 mg Per Tube QHS  . sodium chloride flush  10-40 mL Intracatheter Q12H  . vancomycin variable dose per unstable renal function (pharmacist dosing)   Does not apply See admin instructions   Infusions:  . sodium chloride 10 mL/hr at 08/03/19 1800  . sodium chloride 20 mL/hr at 08/06/19 0800  . amiodarone 30 mg/hr (08/06/19 0800)  . cefTRIAXone (ROCEPHIN)  IV Stopped (08/05/19 1411)  . heparin 1,000 Units/hr (08/06/19 0800)    Assessment: Pt is a 73 y/o female admitted s/p out of hospital cardiac arrest. ROSC was achieved and targeted temperature management was initiated. Pt has a history of A-fib (taking eliquis PTA), diastolic heart failure, HTN, HDL, IDDM, CKD. Pharmacy has been consulted to dose heparin for A-fib.  Baseline aPTT is 23 and heparin level is 1.22. Patient was on Eliquis PTA but last dose is unknown.   Heparin level still slightly elevated and appears to be close to correlating, aptt at goal at 92s. Hgb trending down to 10>8.5, plt also trending down to 150s. No bleeding issues noted.   Goal of Therapy:  Heparin level 0.3-0.7 units/ml aPTT 66-102 seconds Monitor platelets by anticoagulation protocol: Yes   Plan:  Continue heparin at 1000 units/hr Daily heparin level, aptt cbc - may be able to switch to heparin levels alone tomorrow Will need to watch cbc closely given trends  Erin Hearing PharmD., BCPS Clinical Pharmacist 08/06/2019 10:09 AM

## 2019-08-06 NOTE — Plan of Care (Signed)
  Problem: Education: Goal: Knowledge of General Education information will improve Description Including pain rating scale, medication(s)/side effects and non-pharmacologic comfort measures Outcome: Progressing   Problem: Health Behavior/Discharge Planning: Goal: Ability to manage health-related needs will improve Outcome: Progressing   Problem: Clinical Measurements: Goal: Ability to maintain clinical measurements within normal limits will improve Outcome: Progressing Goal: Will remain free from infection Outcome: Progressing Goal: Diagnostic test results will improve Outcome: Progressing Goal: Respiratory complications will improve Outcome: Progressing Goal: Cardiovascular complication will be avoided Outcome: Progressing   Problem: Activity: Goal: Risk for activity intolerance will decrease Outcome: Progressing   Problem: Nutrition: Goal: Adequate nutrition will be maintained Outcome: Progressing   Problem: Coping: Goal: Level of anxiety will decrease Outcome: Progressing   Problem: Elimination: Goal: Will not experience complications related to bowel motility Outcome: Progressing Goal: Will not experience complications related to urinary retention Outcome: Progressing   Problem: Pain Managment: Goal: General experience of comfort will improve Outcome: Progressing   Problem: Safety: Goal: Ability to remain free from injury will improve Outcome: Progressing   Problem: Skin Integrity: Goal: Risk for impaired skin integrity will decrease Outcome: Progressing   Problem: Education: Goal: Ability to manage disease process will improve Outcome: Progressing   Problem: Cardiac: Goal: Ability to achieve and maintain adequate cardiopulmonary perfusion will improve Outcome: Progressing   Problem: Neurologic: Goal: Promote progressive neurologic recovery Outcome: Progressing   Problem: Skin Integrity: Goal: Risk for impaired skin integrity will be  minimized. Outcome: Progressing   

## 2019-08-06 NOTE — Progress Notes (Signed)
Wasted 252ml fentanyl from gtt bag with France Ravens RN

## 2019-08-06 NOTE — Progress Notes (Signed)
Progress Note  Patient Name: Jaclyn Day Date of Encounter: 08/06/2019  Primary Cardiologist: Ena Dawley, MD   Subjective   Creatinine continues to rise - now 3.74. H/H lower at 8.5/27.8.  UTI/urosepsis secondary to e. Coli, but now positive for staph aureus in the blood cultures (1/2 bottles). Off pressors. Some purposeful movement overnight per family member.  Inpatient Medications    Scheduled Meds: . atorvastatin  20 mg Per NG tube Daily  . chlorhexidine gluconate (MEDLINE KIT)  15 mL Mouth Rinse BID  . Chlorhexidine Gluconate Cloth  6 each Topical Daily  . feeding supplement (PRO-STAT SUGAR FREE 64)  30 mL Per Tube TID  . feeding supplement (VITAL HIGH PROTEIN)  1,000 mL Per Tube Q24H  . fentaNYL (SUBLIMAZE) injection  50 mcg Intravenous Once  . insulin aspart  0-9 Units Subcutaneous Q4H  . mouth rinse  15 mL Mouth Rinse 10 times per day  . midazolam  1 mg Intravenous Once  . pantoprazole sodium  40 mg Per Tube QHS  . sodium chloride flush  10-40 mL Intracatheter Q12H  . vancomycin variable dose per unstable renal function (pharmacist dosing)   Does not apply See admin instructions   Continuous Infusions: . sodium chloride 10 mL/hr at 08/03/19 1800  . sodium chloride 20 mL/hr at 08/06/19 0700  . amiodarone 30 mg/hr (08/06/19 0700)  . cefTRIAXone (ROCEPHIN)  IV Stopped (08/05/19 1411)  . fentaNYL infusion INTRAVENOUS Stopped (08/04/19 2305)  . heparin 1,000 Units/hr (08/06/19 0700)  . midazolam    . norepinephrine (LEVOPHED) Adult infusion Stopped (08/05/19 0105)  . phenylephrine (NEO-SYNEPHRINE) Adult infusion Stopped (08/05/19 1857)   PRN Meds: fentaNYL, midazolam, sodium chloride flush   Vital Signs    Vitals:   08/06/19 0500 08/06/19 0600 08/06/19 0749 08/06/19 0754  BP: 128/69 113/65    Pulse: 63 66    Resp: 12 13    Temp:    98.2 F (36.8 C)  TempSrc:    Oral  SpO2: 100% 100% 100%   Weight:      Height:        Intake/Output Summary  (Last 24 hours) at 08/06/2019 0757 Last data filed at 08/06/2019 0700 Gross per 24 hour  Intake 2692.09 ml  Output 20 ml  Net 2672.09 ml   Filed Weights   08/03/19 0940 08/05/19 0600 08/06/19 0217  Weight: 87.5 kg 79.3 kg 81.9 kg    Telemetry    Sinus rhythm with SVT O/N, PVCS - Personally Reviewed  ECG    Sinus rhythm with PVC's - Personally Reviewed  Physical Exam   General appearance: winces to painful stimuli, not purposefully following commands Neck: no carotid bruit, no JVD and thyroid not enlarged, symmetric, no tenderness/mass/nodules Lungs: clear to auscultation bilaterally Heart: regular rate and rhythm Abdomen: obese Extremities: edema 1+ Pulses: 2+ and symmetric Skin: pale, warm,dry Neurologic: Mental status: intubated, winces to pain, does not open eyes or follow commands Psych: Cannot assess  Labs    Chemistry Recent Labs  Lab 08/03/19 1013  08/04/19 2059 08/05/19 0521 08/05/19 1526 08/05/19 2002  NA 142   < > 139 141 140 138  K 3.7   < > 4.0 3.8 3.6 3.5  CL 111   < > 108 111 109  --   CO2 18*   < > 15* 16* 16*  --   GLUCOSE 145*   < > 112* 96 138*  --   BUN 37*   < > 48* 48*  51*  --   CREATININE 2.64*   < > 3.35* 3.55* 3.74*  --   CALCIUM 8.2*   < > 8.2* 7.9* 8.0*  --   PROT 4.8*  --   --   --   --   --   ALBUMIN 2.3*  --   --   --   --   --   AST 26  --   --   --   --   --   ALT 20  --   --   --   --   --   ALKPHOS 94  --   --   --   --   --   BILITOT 1.2  --   --   --   --   --   GFRNONAA 17*   < > 13* 12* 11*  --   GFRAA 20*   < > 15* 14* 13*  --   ANIONGAP 13   < > 16* 14 15  --    < > = values in this interval not displayed.     Hematology Recent Labs  Lab 08/04/19 0415  08/05/19 0521 08/05/19 2002 08/06/19 0203  WBC 36.2*  --  31.5*  --  20.6*  RBC 4.02  --  3.41*  --  2.90*  HGB 11.9*   < > 10.0* 9.2* 8.5*  HCT 39.3   < > 32.7* 27.0* 27.8*  MCV 97.8  --  95.9  --  95.9  MCH 29.6  --  29.3  --  29.3  MCHC 30.3  --  30.6   --  30.6  RDW 16.5*  --  16.8*  --  17.0*  PLT 230  --  227  --  158   < > = values in this interval not displayed.    Cardiac EnzymesNo results for input(s): TROPONINI in the last 168 hours. No results for input(s): TROPIPOC in the last 168 hours.   BNPNo results for input(s): BNP, PROBNP in the last 168 hours.   DDimer  Recent Labs  Lab 08/03/19 1239  DDIMER 11.22*     Radiology    US Renal  Result Date: 08/04/2019 CLINICAL DATA:  Acute kidney injury EXAM: RENAL / URINARY TRACT ULTRASOUND COMPLETE COMPARISON:  08/25/2005 ultrasound, CT 11/06/2008 FINDINGS: Right Kidney: Renal measurements: 11.9 x 5.8 x 6.3 cm = volume: 226.6 mL. Increased cortical echogenicity with diffuse cortical thinning. No hydronephrosis. Cysts, fewer than 10 within the right kidney. Midpole cyst measures 2.9 x 2.5 by 2.2 cm. Upper pole cyst measures 1.9 x 1.2 by 1.2 cm. Lower pole cyst measures 1.7 x 1.4 x 1.6 cm. Left Kidney: Nonvisualized and surgically absent Bladder: Not visualized and presumed empty Other: Right pleural effusion IMPRESSION: 1. Status post left nephrectomy 2. Echogenic right kidney with cortical thinning consistent with medical renal disease. No hydronephrosis. Cysts, fewer than 10 in the right kidney. 3. Right pleural effusion Electronically Signed   By: Donavan Foil M.D.   On: 08/04/2019 16:12   Dg Chest Port 1 View  Result Date: 08/05/2019 CLINICAL DATA:  Endotracheal tube placement EXAM: PORTABLE CHEST 1 VIEW COMPARISON:  Chest radiograph dated 08/03/2019. FINDINGS: An endotracheal tube terminates 2.5 cm from the carina. An enteric tube enters the stomach and terminates below the field of view. Deferred related or pads overlie the lower chest. The heart remains enlarged. Vascular calcifications are seen in the aortic arch. Bilateral perihilar and lower lung predominant airspace opacities  have increased from prior exam. There is no pleural effusion or pneumothorax. IMPRESSION: 1.  Endotracheal tube terminates 2.5 cm from the carina. 2. Increased bilateral airspace opacities. Electronically Signed   By: Zerita Boers M.D.   On: 08/05/2019 13:07    Cardiac Studies   TTE 08/03/19  1. Left ventricular ejection fraction, by visual estimation, is 20 to 25%. The left ventricle has severely decreased function. Normal left ventricular size. There is moderately increased left ventricular hypertrophy. Severe global hypokinesis.  2. Left ventricular diastolic function could not be evaluated pattern of LV diastolic filling.  3. Global right ventricle has mildly reduced systolic function.The right ventricular size is normal. No increase in right ventricular wall thickness.  4. Left atrial size was severely dilated.  5. Right atrial size was normal.  6. The mitral valve is abnormal. Mild mitral valve regurgitation.  7. The tricuspid valve is grossly normal. Tricuspid valve regurgitation is trivial.  8. The aortic valve is tricuspid Aortic valve regurgitation was not visualized by color flow Doppler.  9. The pulmonic valve was grossly normal. Pulmonic valve regurgitation is not visualized by color flow Doppler. 10. Normal pulmonary artery systolic pressure. 11. The inferior vena cava is normal in size with <50% respiratory variability, suggesting right atrial pressure of 8 mmHg. (on vent) 12. The interatrial septum was not well visualized.  Patient Profile     ANYAE GRIFFITH is a 73 y.o. female with a hx of A.fib on Eliquis, Diastolic heart failure, Htn, HLd, IDDM, CKD 4 w/ hx of nephrectomy admit post-arrest, found to be in A.fib w/ RVR and worsening systolic heart failure  Assessment & Plan    1. Post Arrest-Care Presented to [facility] after arrest - ROSC after 10 minutes, 1 dose of Epi. No record of presenting rhythm. EKG w/ ST depressions on anterolateral leads, now resolved. HsTrop 48->715->2793. D-dimer 11.22. Procalcitonin 0.88 Wbc 32.1->36.2 Lactate 4.3->1.9. CT head w/o acute  findings. Resp viral panel neg. COVID neg. Required levophed overnight but currently off pressors. Echocardiogram showing diffuse hypokinesis w/ significantly worsened EF (20-25%) compared to prior. EEG showing no seizures. - Off cooling - Levophed as needed to keep MAP >65 - C/w ceftriaxone - Will need LHC but with CKD4, need to closely monitor renal function - nephrology consult pending per PCCM today. - Will need EP eval for ICD eventually  2. Atrial Fibrillation Prior hx of A.fib converted to sinus after cardioversion. Current rhythm A.fib w/ HR 100-120s. Will likely remain tachycardic until she recovers from underlying cause - C/w heparin gtt - C/w amiodarone gtt - Now maintaining sinus rhythm  3. Acute systolic heart failure Prior hx of diastolic heart failure now with new acute systolic heart failure. Echo showing global hypokinesis. Possibly sequelae of arrest. Cannot rule out ischemic event. As stated above, need ischemic eval with cath but likely will worsen kidney disease. - Repeat Echo at later date (08/07/19) - positive staph aureus in 1/2 Blood Cx, ?contaminant. If the other culture is positive or there is concern for true staff bacteremia, consider TEE instead. - Daily weights, I&Os - C/w telemetry  4. Chronic Kidney Disease stage 4 Hx of L nephrectomy Baseline creatinine 2.7. Admit creatinine 2.64 -> 2.79 -> 3.74. Worsening function with minimal urinary output. - Agree with nephrology consultation - Trend renal fx - Avoid nephrotoxic meds when able    For questions or updates, please contact [place] Please consult [email or web address] for contact info under Cardiology/STEMI.   Pixie Casino, MD,  FACC, [place] Director of the [facility] Diplomate of the American Board of Clinical Lipidology Attending Cardiologist  Direct Dial: [phone]  Fax: [phone]  Website:   [email or web address]  08/06/2019, 7:57 AM

## 2019-08-07 ENCOUNTER — Other Ambulatory Visit: Payer: Self-pay

## 2019-08-07 ENCOUNTER — Inpatient Hospital Stay (HOSPITAL_COMMUNITY): Payer: 59

## 2019-08-07 ENCOUNTER — Encounter (HOSPITAL_COMMUNITY): Payer: Self-pay

## 2019-08-07 DIAGNOSIS — R7881 Bacteremia: Secondary | ICD-10-CM

## 2019-08-07 DIAGNOSIS — N179 Acute kidney failure, unspecified: Secondary | ICD-10-CM | POA: Diagnosis not present

## 2019-08-07 DIAGNOSIS — I469 Cardiac arrest, cause unspecified: Secondary | ICD-10-CM | POA: Diagnosis not present

## 2019-08-07 DIAGNOSIS — N184 Chronic kidney disease, stage 4 (severe): Secondary | ICD-10-CM

## 2019-08-07 DIAGNOSIS — I361 Nonrheumatic tricuspid (valve) insufficiency: Secondary | ICD-10-CM

## 2019-08-07 DIAGNOSIS — B9561 Methicillin susceptible Staphylococcus aureus infection as the cause of diseases classified elsewhere: Secondary | ICD-10-CM | POA: Diagnosis not present

## 2019-08-07 DIAGNOSIS — I340 Nonrheumatic mitral (valve) insufficiency: Secondary | ICD-10-CM

## 2019-08-07 DIAGNOSIS — R778 Other specified abnormalities of plasma proteins: Secondary | ICD-10-CM

## 2019-08-07 DIAGNOSIS — I4819 Other persistent atrial fibrillation: Secondary | ICD-10-CM

## 2019-08-07 LAB — GLUCOSE, CAPILLARY
Glucose-Capillary: 113 mg/dL — ABNORMAL HIGH (ref 70–99)
Glucose-Capillary: 127 mg/dL — ABNORMAL HIGH (ref 70–99)
Glucose-Capillary: 116 mg/dL — ABNORMAL HIGH (ref 70–99)
Glucose-Capillary: 114 mg/dL — ABNORMAL HIGH (ref 70–99)
Glucose-Capillary: 130 mg/dL — ABNORMAL HIGH (ref 70–99)

## 2019-08-07 LAB — APTT: aPTT: 69 seconds — ABNORMAL HIGH (ref 24–36)

## 2019-08-07 LAB — CBC WITH DIFFERENTIAL/PLATELET
Abs Immature Granulocytes: 0.15 10*3/uL — ABNORMAL HIGH (ref 0.00–0.07)
Basophils Absolute: 0 10*3/uL (ref 0.0–0.1)
Basophils Relative: 0 %
Eosinophils Absolute: 0 10*3/uL (ref 0.0–0.5)
Eosinophils Relative: 0 %
HCT: 26.5 % — ABNORMAL LOW (ref 36.0–46.0)
Hemoglobin: 8.3 g/dL — ABNORMAL LOW (ref 12.0–15.0)
Immature Granulocytes: 1 %
Lymphocytes Relative: 8 %
Lymphs Abs: 1 10*3/uL (ref 0.7–4.0)
MCH: 29.3 pg (ref 26.0–34.0)
MCHC: 31.3 g/dL (ref 30.0–36.0)
MCV: 93.6 fL (ref 80.0–100.0)
Monocytes Absolute: 0.8 10*3/uL (ref 0.1–1.0)
Monocytes Relative: 6 %
Neutro Abs: 10.5 10*3/uL — ABNORMAL HIGH (ref 1.7–7.7)
Neutrophils Relative %: 85 %
Platelets: 143 10*3/uL — ABNORMAL LOW (ref 150–400)
RBC: 2.83 MIL/uL — ABNORMAL LOW (ref 3.87–5.11)
RDW: 17 % — ABNORMAL HIGH (ref 11.5–15.5)
WBC: 12.5 10*3/uL — ABNORMAL HIGH (ref 4.0–10.5)
nRBC: 0 % (ref 0.0–0.2)

## 2019-08-07 LAB — MAGNESIUM: Magnesium: 2.1 mg/dL (ref 1.7–2.4)

## 2019-08-07 LAB — BASIC METABOLIC PANEL
Anion gap: 14 (ref 5–15)
BUN: 43 mg/dL — ABNORMAL HIGH (ref 8–23)
CO2: 20 mmol/L — ABNORMAL LOW (ref 22–32)
Calcium: 7.9 mg/dL — ABNORMAL LOW (ref 8.9–10.3)
Chloride: 104 mmol/L (ref 98–111)
Creatinine, Ser: 2.78 mg/dL — ABNORMAL HIGH (ref 0.44–1.00)
GFR calc Af Amer: 19 mL/min — ABNORMAL LOW (ref >60)
GFR calc non Af Amer: 16 mL/min — ABNORMAL LOW (ref >60)
Glucose, Bld: 119 mg/dL — ABNORMAL HIGH (ref 70–99)
Potassium: 3.2 mmol/L — ABNORMAL LOW (ref 3.5–5.1)
Sodium: 138 mmol/L (ref 135–145)

## 2019-08-07 LAB — PHOSPHORUS: Phosphorus: 2.6 mg/dL (ref 2.5–4.6)

## 2019-08-07 LAB — ECHOCARDIOGRAM LIMITED
Height: 66 in
Weight: 2860.69 oz

## 2019-08-07 LAB — HEPARIN LEVEL (UNFRACTIONATED): Heparin Unfractionated: 0.43 IU/mL (ref 0.30–0.70)

## 2019-08-07 MED ORDER — DEXMEDETOMIDINE HCL IN NACL 400 MCG/100ML IV SOLN
0.4000 ug/kg/h | INTRAVENOUS | Status: DC
  Administered 2019-08-07: 0.4 ug/kg/h via INTRAVENOUS
  Filled 2019-08-07: qty 100

## 2019-08-07 MED ORDER — CHLORHEXIDINE GLUCONATE 0.12 % MT SOLN
OROMUCOSAL | Status: AC
  Filled 2019-08-07: qty 15

## 2019-08-07 MED ORDER — POTASSIUM CHLORIDE 10 MEQ/50ML IV SOLN
10.0000 meq | INTRAVENOUS | Status: AC
  Administered 2019-08-07 (×2): 10 meq via INTRAVENOUS
  Filled 2019-08-07: qty 50

## 2019-08-07 NOTE — Progress Notes (Signed)
Progress Note  Patient Name: Jaclyn Day Date of Encounter: 08/07/2019  Primary Cardiologist: Ena Dawley, MD   Subjective  O/N events: Dialyzed yesterday. Improved renal fx this am.  Mrs.Pain was examined and evaluated at bedside this AM. She was evaluated with sister at bedside. According to sister, was able to follow directions but states she just received sedation.   Inpatient Medications    Scheduled Meds: . atorvastatin  20 mg Per NG tube Daily  . chlorhexidine gluconate (MEDLINE KIT)  15 mL Mouth Rinse BID  . Chlorhexidine Gluconate Cloth  6 each Topical Daily  . feeding supplement (PRO-STAT SUGAR FREE 64)  30 mL Per Tube TID  . feeding supplement (VITAL HIGH PROTEIN)  1,000 mL Per Tube Q24H  . insulin aspart  0-9 Units Subcutaneous Q4H  . mouth rinse  15 mL Mouth Rinse 10 times per day  . pantoprazole sodium  40 mg Per Tube QHS  . sodium chloride flush  10-40 mL Intracatheter Q12H   Continuous Infusions: . sodium chloride 10 mL/hr at 08/07/19 0322  . sodium chloride 20 mL/hr at 08/07/19 0300  . sodium chloride    . sodium chloride    . amiodarone 30 mg/hr (08/07/19 0600)  .  ceFAZolin (ANCEF) IV Stopped (08/06/19 1940)  . heparin 1,000 Units/hr (08/07/19 0600)  . potassium chloride 10 mEq (08/07/19 0623)   PRN Meds: sodium chloride, sodium chloride, alteplase, fentaNYL (SUBLIMAZE) injection, heparin, midazolam, sodium chloride flush   Vital Signs    Vitals:   08/07/19 0341 08/07/19 0400 08/07/19 0500 08/07/19 0600  BP:      Pulse:  65 67 70  Resp:  (!) 24 (!) 23 20  Temp:  98.1 F (36.7 C) 98.6 F (37 C) 98.8 F (37.1 C)  TempSrc:      SpO2:  100% 100% 100%  Weight: 81.1 kg     Height:        Intake/Output Summary (Last 24 hours) at 08/07/2019 0700 Last data filed at 08/07/2019 0600 Gross per 24 hour  Intake 1001.1 ml  Output 1200 ml  Net -198.9 ml   Filed Weights   08/05/19 0600 08/06/19 0217 08/07/19 0341  Weight: 79.3 kg  81.9 kg 81.1 kg    Telemetry    Sinus rhythm with SVT O/N, PVCS - Personally Reviewed  ECG    Sinus rhythm with PVC's - Personally Reviewed  Physical Exam   General appearance: winces to painful stimuli, not purposefully following commands Neck: no carotid bruit, no JVD and thyroid not enlarged, symmetric, no tenderness/mass/nodules Lungs: clear to auscultation bilaterally Heart: regular rate and rhythm Abdomen: obese Extremities: edema 1+ Pulses: 2+ and symmetric Skin: pale, warm,dry Neurologic: Mental status: intubated, winces to pain, does not open eyes or follow commands Psych: Cannot assess  Labs    Chemistry Recent Labs  Lab 08/03/19 1013  08/05/19 1526 08/05/19 2002 08/06/19 0854 08/07/19 0327  NA 142   < > 140 138 139 138  K 3.7   < > 3.6 3.5 3.2* 3.2*  CL 111   < > 109  --  109 104  CO2 18*   < > 16*  --  18* 20*  GLUCOSE 145*   < > 138*  --  105* 119*  BUN 37*   < > 51*  --  57* 43*  CREATININE 2.64*   < > 3.74*  --  3.82* 2.78*  CALCIUM 8.2*   < > 8.0*  --  7.8*  7.9*  PROT 4.8*  --   --   --   --   --   ALBUMIN 2.3*  --   --   --   --   --   AST 26  --   --   --   --   --   ALT 20  --   --   --   --   --   ALKPHOS 94  --   --   --   --   --   BILITOT 1.2  --   --   --   --   --   GFRNONAA 17*   < > 11*  --  11* 16*  GFRAA 20*   < > 13*  --  13* 19*  ANIONGAP 13   < > 15  --  12 14   < > = values in this interval not displayed.     Hematology Recent Labs  Lab 08/06/19 0203 08/06/19 1433 08/07/19 0327  WBC 20.6* 16.3* 12.5*  RBC 2.90* 2.89* 2.83*  HGB 8.5* 8.5* 8.3*  HCT 27.8* 27.8* 26.5*  MCV 95.9 96.2 93.6  MCH 29.3 29.4 29.3  MCHC 30.6 30.6 31.3  RDW 17.0* 17.0* 17.0*  PLT 158 148* 143*    Cardiac EnzymesNo results for input(s): TROPONINI in the last 168 hours. No results for input(s): TROPIPOC in the last 168 hours.   BNPNo results for input(s): BNP, PROBNP in the last 168 hours.   DDimer  Recent Labs  Lab 08/03/19 1239   DDIMER 11.22*     Radiology    Dg Chest Port 1 View  Result Date: 08/06/2019 CLINICAL DATA:  Cardiac arrest EXAM: PORTABLE CHEST 1 VIEW COMPARISON:  Chest radiograph dated 08/05/2019 FINDINGS: An endotracheal tube terminates in the lower thoracic trachea. An enteric tube enters the stomach and terminates below the field of view. There is mild cardiomegaly, unchanged. There is a trace right pleural effusion. A left pleural effusion is difficult to exclude. There is mild bibasilar atelectasis. There is no pneumothorax. IMPRESSION: 1. Endotracheal tube terminates in the lower thoracic trachea. 2. Trace right pleural effusion. 3. Mild bibasilar atelectasis. 4. Unchanged cardiomegaly. Electronically Signed   By: Zerita Boers M.D.   On: 08/06/2019 15:19   Dg Chest Port 1 View  Result Date: 08/06/2019 CLINICAL DATA:  Central line placement EXAM: PORTABLE CHEST 1 VIEW COMPARISON:  Chest radiograph from earlier today. FINDINGS: Endotracheal tube tip is 1.9 cm above the carina. Enteric tube enters stomach with the tip not seen on this image. Esophageal temperature probe terminates over the lower third of the thoracic esophagus. Right internal jugular central venous catheter terminates over the cavoatrial junction. Stable cardiomediastinal silhouette with mild cardiomegaly. No pneumothorax. Probable small stable right pleural effusion. No left pleural effusion. Mild hazy right parahilar opacity is stable. Stable patchy left lung base opacity. IMPRESSION: 1. No pneumothorax status post right internal jugular central venous catheter placement. 2. Endotracheal tube tip 1.9 cm above the carina. 3. Stable mild cardiomegaly. Stable mild hazy right parahilar opacity, favor mild pulmonary edema and/or atelectasis. 4. Stable small right pleural effusion. 5. Stable patchy left lung base opacity, favor atelectasis. Electronically Signed   By: Ilona Sorrel M.D.   On: 08/06/2019 13:55   Dg Chest Port 1 View  Result Date:  08/05/2019 CLINICAL DATA:  Endotracheal tube placement EXAM: PORTABLE CHEST 1 VIEW COMPARISON:  Chest radiograph dated 08/03/2019. FINDINGS: An endotracheal tube terminates 2.5 cm from  the carina. An enteric tube enters the stomach and terminates below the field of view. Deferred related or pads overlie the lower chest. The heart remains enlarged. Vascular calcifications are seen in the aortic arch. Bilateral perihilar and lower lung predominant airspace opacities have increased from prior exam. There is no pleural effusion or pneumothorax. IMPRESSION: 1. Endotracheal tube terminates 2.5 cm from the carina. 2. Increased bilateral airspace opacities. Electronically Signed   By: Zerita Boers M.D.   On: 08/05/2019 13:07    Cardiac Studies   TTE 08/03/19  1. Left ventricular ejection fraction, by visual estimation, is 20 to 25%. The left ventricle has severely decreased function. Normal left ventricular size. There is moderately increased left ventricular hypertrophy. Severe global hypokinesis.  2. Left ventricular diastolic function could not be evaluated pattern of LV diastolic filling.  3. Global right ventricle has mildly reduced systolic function.The right ventricular size is normal. No increase in right ventricular wall thickness.  4. Left atrial size was severely dilated.  5. Right atrial size was normal.  6. The mitral valve is abnormal. Mild mitral valve regurgitation.  7. The tricuspid valve is grossly normal. Tricuspid valve regurgitation is trivial.  8. The aortic valve is tricuspid Aortic valve regurgitation was not visualized by color flow Doppler.  9. The pulmonic valve was grossly normal. Pulmonic valve regurgitation is not visualized by color flow Doppler. 10. Normal pulmonary artery systolic pressure. 11. The inferior vena cava is normal in size with <50% respiratory variability, suggesting right atrial pressure of 8 mmHg. (on vent) 12. The interatrial septum was not well visualized.   Patient Profile     SINTIA MCKISSIC is a 73 y.o. female with a hx of A.fib on Eliquis, Diastolic heart failure, Htn, HLd, IDDM, CKD 4 w/ hx of nephrectomy admit post-arrest, found to be in A.fib w/ RVR and worsening systolic heart failure  Assessment & Plan  1.  Post Arrest-Care MSSA bacteremia Presented to [facility] after arrest - ROSC after 10 minutes, 1 dose of Epi. No record of presenting rhythm. EKG w/ ST depressions on anterolateral leads, now resolved. HsTrop 48->715->2793 on admission. Blood culture showing MSSA bacteremia. Wbc 36.2->12.5 this am. Echocardiogram showing diffuse hypokinesis w/ significantly worsened EF (20-25%) compared to prior. EEG showing no seizures. - C/w targeted temperature management - Levophed as needed to keep MAP >65 - C/w ceftriaxone - Limited Echo this am - Holding on Cath due to AKI. Received dialysis session yesterday - EP eval in future  2. Atrial Fibrillation Prior hx of A.fib converted to sinus after cardioversion. Currently in sinus (irregular rhythm but able to see p-waves). HR currently in 80s - C/w heparin gtt - Wean offamiodarone - Monitor/replete K/Mag as appropriate - C/w telemetry  3. Acute systolic heart failure Prior hx of diastolic heart failure now with new acute systolic heart failure. Echo showing global hypokinesis. Possibly sequelae of arrest. Cannot rule out ischemic event. As stated above, need ischemic eval with cath but likely will worsen kidney disease. - Repeat Echo this am - Daily weights, I&Os - C/w telemetry  4. Acute on Chronic Kidney Disease stage 4 Hx of L nephrectomy Baseline creatinine 2.7. Admit creatinine 2.64 -> 3.82 -> 2.8. Worsening renal fx overnight but improved this am after dialysis yesterday - Trend renal fx - Avoid nephrotoxic meds when able    For questions or updates, please contact [place] Please consult [email or web address] for contact info under Cardiology/STEMI.   Signed, Gilberto Better, MD  PGY-2, [facility] Pager: [phone] 08/04/2019, 7:05 AM

## 2019-08-07 NOTE — Plan of Care (Signed)
  Problem: Clinical Measurements: Goal: Ability to maintain clinical measurements within normal limits will improve Outcome: Progressing Goal: Respiratory complications will improve Outcome: Progressing   Problem: Skin Integrity: Goal: Risk for impaired skin integrity will decrease Outcome: Progressing   

## 2019-08-07 NOTE — Consult Note (Signed)
[facility] for Infectious Disease    Date of Admission:  08/03/2019     Total days of antibiotics 4    Day 2 cefazolin         Reason for Consult: MSSA bacteremia     Referring Provider: [place]  Primary Care Provider: Cyndi Bender, PA-C    Assessment: Jaclyn Day is a 73 y.o. female admitted to the hospital following out of hospital cardiac arrest. Blood cultures drawn on 10/22 d/t leukocytosis of 36K and found to be + for MSSA in 1/4 bottles. Source of infection is at this time unclear. She had a TTE on 10/22 that revealed a severely depressed EF post-arrest. Troponin 48.   Unfortunately her central line would be considered contaminated and will need to be pulled out and replaced with a PIV. She had a HD catheter in the right chest placed yesterday after being on antistaphylococcal antibiotics for 4 days; although we have not yet proven clearance of bacteremia likely can leave this until it is decided whether they need to dialyzer her or not.   Will repeat blood cultures today. Recommend TEE (will call cardiology) to formally rule out endocarditis to be certain we can treat bacteremia with a shorter course with cefazolin. Once she is more alert and can be extubated will see what exam/ROS reveals regarding further work up of bacteremia.    Plan: 1. Continue cefazolin  2. Please place PIVs and pull left groin central line 3. TEE (I have called cardiology)  4. Repeat blood cultures after central line is removed   Principal Problem:   MSSA bacteremia Active Problems:   Cardiac arrest ([place])   Pressure injury of skin   Acute respiratory failure with hypoxia (HCC)   AKI (acute kidney injury) ([place])   Elevated troponin   Persistent atrial fibrillation ([place])   . atorvastatin  20 mg Per NG tube Daily  . chlorhexidine gluconate (MEDLINE KIT)  15 mL Mouth Rinse BID  . Chlorhexidine Gluconate Cloth  6 each Topical Daily  . feeding supplement (PRO-STAT  SUGAR FREE 64)  30 mL Per Tube TID  . feeding supplement (VITAL HIGH PROTEIN)  1,000 mL Per Tube Q24H  . insulin aspart  0-9 Units Subcutaneous Q4H  . mouth rinse  15 mL Mouth Rinse 10 times per day  . pantoprazole sodium  40 mg Per Tube QHS  . sodium chloride flush  10-40 mL Intracatheter Q12H    HPI: Jaclyn Day is a 73 y.o. female with pmhx CKD stage IV, AFib s/p cardioversion, dCHF.   Chart reviewed for history of current admission. Presented to the hospital after out of hospital cardiac arrest (10 min CPR with ROSC after 1 amp epi). She remains intubated and in ICU. Leukocytosis to 36K with hypothermia noted (placed on targeted temperature protocol following arrest) which has improved on ceftriaxone injections daily; this was started due to concern for urinary source of leukocytosis. She was switched to Cefazolin 10/25 when blood cultures drawn from 10/22 were positive for MSSA in 1/4 bottles. BCID not done d/t low supply and only growing GPCs in 1/2 sites. She has a central line in the left groin that was placed during her arrest when she first arrived. Right neck HD catheter was placed for acute on chronic AKI and oliguria (200cc out last 24h).  CXR is   Echo on 10/22 with EF 20-25%. No large vegetations. TEE/cardioversion in April 2020 for AFib -  EF at that time was 55-60%.    Review of Systems: Review of Systems  Unable to perform ROS: Intubated    Past Medical History:  Diagnosis Date  . Anemia   . Chronic diastolic CHF (congestive heart failure) ([place])   . CKD (chronic kidney disease), stage IV ([place])   . Hypertension   . Persistent atrial fibrillation ([place])    a. dx 01/2019 s/p TEE DCCV.  Marland Kitchen Renal disorder   . Unilateral congenital absence of kidney     Social History   Tobacco Use  . Smoking status: Never Smoker  . Smokeless tobacco: Never Used  Substance Use Topics  . Alcohol use: Not Currently  . Drug use: Not Currently    Family History  Problem Relation  Age of Onset  . Atrial fibrillation Father   . Atrial fibrillation Brother    Allergies  Allergen Reactions  . Codeine Other (See Comments)    Reaction not recalled by family- was told to never take this    OBJECTIVE: Blood pressure (!) 143/67, pulse (!) 104, temperature 98.6 F (37 C), temperature source Axillary, resp. rate 20, height _0  (1.676 m), weight 81.1 kg, SpO2 100 %.  Physical Exam Constitutional:      Comments: Not following commands. Grimaced with assessing eyes and pulled away. No continuous sedation.   HENT:     Mouth/Throat:     Mouth: Mucous membranes are moist.  Eyes:     Pupils: Pupils are equal, round, and reactive to light.  Neck:     Musculoskeletal: Normal range of motion.  Cardiovascular:     Rate and Rhythm: Normal rate. Rhythm irregular.     Heart sounds: No murmur.  Pulmonary:     Effort: Pulmonary effort is normal.     Breath sounds: Normal breath sounds.     Comments: Breathing comfortably on vent @ 30% FiO2 Abdominal:     General: Abdomen is flat. Bowel sounds are normal. There is no distension.     Tenderness: There is no abdominal tenderness.  Musculoskeletal: Normal range of motion.  Skin:    General: Skin is warm and dry.     Capillary Refill: Capillary refill takes less than 2 seconds.     Comments: Scattered areas of ecchymosis and abrasions on lower and upper extremities.      Lab Results Lab Results  Component Value Date   WBC 12.5 (H) 08/07/2019   HGB 8.3 (L) 08/07/2019   HCT 26.5 (L) 08/07/2019   MCV 93.6 08/07/2019   PLT 143 (L) 08/07/2019    Lab Results  Component Value Date   CREATININE 2.78 (H) 08/07/2019   BUN 43 (H) 08/07/2019   NA 138 08/07/2019   K 3.2 (L) 08/07/2019   CL 104 08/07/2019   CO2 20 (L) 08/07/2019    Lab Results  Component Value Date   ALT 20 08/03/2019   AST 26 08/03/2019   ALKPHOS 94 08/03/2019   BILITOT 1.2 08/03/2019     Microbiology: Recent Results (from the past 240 hour(s))   Blood culture (routine x 2)     Status: None (Preliminary result)   Collection Time: 08/03/19 10:33 AM   Specimen: BLOOD RIGHT HAND  Result Value Ref Range Status   Specimen Description BLOOD RIGHT HAND  Final   Special Requests   Final    BOTTLES DRAWN AEROBIC ONLY Blood Culture results may not be optimal due to an inadequate volume of blood received in culture bottles  Culture   Final    NO GROWTH 4 DAYS Performed at [facility], [place] [address] [place], [place] [address]    Report Status PENDING  Incomplete  Blood culture (routine x 2)     Status: Abnormal   Collection Time: 08/03/19 10:48 AM   Specimen: BLOOD  Result Value Ref Range Status   Specimen Description BLOOD LEFT FEMORAL ARTERY  Final   Special Requests   Final    BOTTLES DRAWN AEROBIC AND ANAEROBIC Blood Culture adequate volume   Culture  Setup Time   Final    GRAM POSITIVE COCCI IN CLUSTERS AEROBIC BOTTLE ONLY CRITICAL RESULT CALLED TO, READ BACK BY AND VERIFIED WITH: J. LEDFORD,PHARMD 0207 08/04/2019 Mena Goes Performed at [facility], [place] [address] [place], [place] [address]    Culture STAPHYLOCOCCUS AUREUS (A)  Final   Report Status 08/06/2019 FINAL  Final   Organism ID, Bacteria STAPHYLOCOCCUS AUREUS  Final      Susceptibility   Staphylococcus aureus - MIC*    CIPROFLOXACIN <=0.5 SENSITIVE Sensitive     ERYTHROMYCIN <=0.25 SENSITIVE Sensitive     GENTAMICIN <=0.5 SENSITIVE Sensitive     OXACILLIN <=0.25 SENSITIVE Sensitive     TETRACYCLINE <=1 SENSITIVE Sensitive     VANCOMYCIN <=0.5 SENSITIVE Sensitive     TRIMETH/SULFA <=10 SENSITIVE Sensitive     CLINDAMYCIN <=0.25 SENSITIVE Sensitive     RIFAMPIN <=0.5 SENSITIVE Sensitive     Inducible Clindamycin NEGATIVE Sensitive     * STAPHYLOCOCCUS AUREUS  SARS Coronavirus 2 by RT PCR (hospital order, performed in [facility] hospital lab) Nasopharyngeal Nasopharyngeal Swab     Status: None   Collection Time: 08/03/19 11:35 AM   Specimen:  Nasopharyngeal Swab  Result Value Ref Range Status   SARS Coronavirus 2 NEGATIVE NEGATIVE Final    Comment: (NOTE) If result is NEGATIVE SARS-CoV-2 target nucleic acids are NOT DETECTED. The SARS-CoV-2 RNA is generally detectable in upper and lower  respiratory specimens during the acute phase of infection. The lowest  concentration of SARS-CoV-2 viral copies this assay can detect is 250  copies / mL. A negative result does not preclude SARS-CoV-2 infection  and should not be used as the sole basis for treatment or other  patient management decisions.  A negative result may occur with  improper specimen collection / handling, submission of specimen other  than nasopharyngeal swab, presence of viral mutation(s) within the  areas targeted by this assay, and inadequate number of viral copies  (<250 copies / mL). A negative result must be combined with clinical  observations, patient history, and epidemiological information. If result is POSITIVE SARS-CoV-2 target nucleic acids are DETECTED. The SARS-CoV-2 RNA is generally detectable in upper and lower  respiratory specimens dur ing the acute phase of infection.  Positive  results are indicative of active infection with SARS-CoV-2.  Clinical  correlation with patient history and other diagnostic information is  necessary to determine patient infection status.  Positive results do  not rule out bacterial infection or co-infection with other viruses. If result is PRESUMPTIVE POSTIVE SARS-CoV-2 nucleic acids MAY BE PRESENT.   A presumptive positive result was obtained on the submitted specimen  and confirmed on repeat testing.  While 2019 novel coronavirus  (SARS-CoV-2) nucleic acids may be present in the submitted sample  additional confirmatory testing may be necessary for epidemiological  and / or clinical management purposes  to differentiate between  SARS-CoV-2 and other Sarbecovirus currently known to  infect humans.  If clinically  indicated additional testing with an alternate test  methodology [id number]) is advised. The SARS-CoV-2 RNA is generally  detectable in upper and lower respiratory sp ecimens during the acute  phase of infection. The expected result is Negative. Fact Sheet for Patients:  StrictlyIdeas.no Fact Sheet for Healthcare Providers: BankingDealers.co.za This test is not yet approved or cleared by the [place] FDA and has been authorized for detection and/or diagnosis of SARS-CoV-2 by FDA under an Emergency Use Authorization (EUA).  This EUA will remain in effect (meaning this test can be used) for the duration of the COVID-19 declaration under Section 564(b)(1) of the Act, 21 U.S.C. section 360bbb-3(b)(1), unless the authorization is terminated or revoked sooner. Performed at [facility], [place] [address], [place], [place] [address]   Culture, Urine     Status: Abnormal   Collection Time: 08/03/19  1:13 PM   Specimen: Urine, Random  Result Value Ref Range Status   Specimen Description URINE, RANDOM  Final   Special Requests   Final    NONE Performed at [facility], [place] [address], [place], [place] [address]    Culture >=100,000 COLONIES/mL ESCHERICHIA COLI (A)  Final   Report Status 08/05/2019 FINAL  Final   Organism ID, Bacteria ESCHERICHIA COLI (A)  Final      Susceptibility   Escherichia coli - MIC*    AMPICILLIN >=32 RESISTANT Resistant     CEFAZOLIN 8 SENSITIVE Sensitive     CEFTRIAXONE <=1 SENSITIVE Sensitive     CIPROFLOXACIN >=4 RESISTANT Resistant     GENTAMICIN >=16 RESISTANT Resistant     IMIPENEM <=0.25 SENSITIVE Sensitive     NITROFURANTOIN <=16 SENSITIVE Sensitive     TRIMETH/SULFA >=320 RESISTANT Resistant     AMPICILLIN/SULBACTAM 16 INTERMEDIATE Intermediate     PIP/TAZO <=4 SENSITIVE Sensitive     Extended ESBL NEGATIVE Sensitive     * >=100,000 COLONIES/mL ESCHERICHIA COLI  MRSA PCR Screening      Status: None   Collection Time: 08/03/19  2:46 PM   Specimen: Nasopharyngeal  Result Value Ref Range Status   MRSA by PCR NEGATIVE NEGATIVE Final    Comment:        The GeneXpert MRSA Assay (FDA approved for NASAL specimens only), is one component of a comprehensive MRSA colonization surveillance program. It is not intended to diagnose MRSA infection nor to guide or monitor treatment for MRSA infections. Performed at [facility], [place] [address] [place], [place] [address]   Respiratory Panel by PCR     Status: None   Collection Time: 08/03/19  3:56 PM   Specimen: Nasopharyngeal Swab; Respiratory  Result Value Ref Range Status   Adenovirus NOT DETECTED NOT DETECTED Final   Coronavirus 229E NOT DETECTED NOT DETECTED Final    Comment: (NOTE) The Coronavirus on the Respiratory Panel, DOES NOT test for the novel  Coronavirus (2019 nCoV)    Coronavirus HKU1 NOT DETECTED NOT DETECTED Final   Coronavirus NL63 NOT DETECTED NOT DETECTED Final   Coronavirus OC43 NOT DETECTED NOT DETECTED Final   Metapneumovirus NOT DETECTED NOT DETECTED Final   Rhinovirus / Enterovirus NOT DETECTED NOT DETECTED Final   Influenza A NOT DETECTED NOT DETECTED Final   Influenza B NOT DETECTED NOT DETECTED Final   Parainfluenza Virus 1 NOT DETECTED NOT DETECTED Final   Parainfluenza Virus 2 NOT DETECTED NOT DETECTED Final   Parainfluenza Virus 3 NOT DETECTED NOT DETECTED Final  Parainfluenza Virus 4 NOT DETECTED NOT DETECTED Final   Respiratory Syncytial Virus NOT DETECTED NOT DETECTED Final   Bordetella pertussis NOT DETECTED NOT DETECTED Final   Chlamydophila pneumoniae NOT DETECTED NOT DETECTED Final   Mycoplasma pneumoniae NOT DETECTED NOT DETECTED Final    Comment: Performed at [facility], [place] [address], [place], [place] [address]    Janene Madeira, MSN, NP-C [facility] for Infectious [place] Cell: [phone] Pager: [phone]  08/07/2019  11:24 AM

## 2019-08-07 NOTE — Progress Notes (Addendum)
NAME:  DELALI ZINTER, MRN:  [id number], DOB:  21-Aug-1946, LOS: 4 ADMISSION DATE:  08/03/2019, CONSULTATION DATE:  10/22 REFERRING MD:  Dr. Sherril Croon, CHIEF COMPLAINT:  Post arrest    Brief History   73yo female admitted after out of hospital cardiac arrest, unclear etiology CPR started by fire 10 min CPR with 1 Epi before ROSC. PCCM called for admission and need for targeted temperature management and ventilator management.   Of note, admitted at this facility 01/25/2019-01/31/2019 for new onset A-fib with development of diastolic congestive heart failure, during admission she underwent TEE and cardioversion with transition to SR.   Past Medical History  Congenital unilateral kidney  CKD stage IV (baseline creatinine 2.2-2.8) w/hx of nephrectomy  Hypertension  Persistent A-fib on anticoagulation/ Eliquis Chronic diastolic CHF Anemia  Cardioversion and TEE 01/31/2019 Mood disorder  Significant Hospital Events   10/22 Admitted post arrest, intubated, initiation of targeted temperature management 36 celsius   10/23 + 800 ml I/O, TTM, Weaned off Levophed overnight, ECHO: LVEF: 20-25%, global hypokinesis, RV midly reduced function with normal size, mild MR, normal pulmonary pressures.  Lactate cleared  10/24 - 40% fio2, Oliguric v Anuric. On amio gtt, heparin gtt, neo gtt . Being weaned off neo gtt. Rewarmed to 37. Non purposeful only (not on sedation). Growing e colii in urine from admission  10/25 - staph identified in blood and vanc started yesterday. Still with poor Ur OP. Foley leak suspected and foley stopped. No new foley . RT says patient followed commands and wants to do SBT. On amio gtt. Off leveophed an dneo. On Heparin gtt  Consults:  PCCM  Procedures:  ETT 10/22 >> R radial Aline 10/22 >> Right femoral CVC 10/22 >> R IJ trialysis 10/25 >>  Significant Diagnostic Tests:  10/22:  ECHO: LVEF: 20-25%, global hypokinesis, RV midly reduced function with normal size, mild MR,  normal pulmonary pressures.  10/22: CTH without acute intracranial pathology  [place] 10/23 - IMPRESSION: 1. Status post left nephrectomy 2. Echogenic right kidney with cortical thinning consistent with medical renal disease. No hydronephrosis. Cysts, fewer than 10 in the right kidney. 3. Right pleural effusion   Micro Data:  Blood culture 10/22 >> staph nos COVID 10/22 >> Negative RPP 10/22 >> Negative Urine 10/22 - E colii  Antimicrobials:  Rocephin 10/22 >> 10/25 Vanc (staph in blood ) 10/24  Cefazolin 10/25 >>  Interim history/subjective:  iHD yest w/ 1L off  Reportedly been following simple commands, s/p versed for chewing on ETT this am Remains hemodynamically stable UOP overnight 50 ml  Objective   Blood pressure (!) 143/67, pulse (!) 104, temperature 98.6 F (37 C), temperature source Axillary, resp. rate 20, height 5\' 6"  (1.676 m), weight 81.1 kg, SpO2 100 %.    Vent Mode: PRVC FiO2 (%):  [30 %] 30 % Set Rate:  [20 bmp] 20 bmp Vt Set:  [470 mL] 470 mL PEEP:  [5 cmH20] 5 cmH20 Plateau Pressure:  [10 cmH20-19 cmH20] 17 cmH20   Intake/Output Summary (Last 24 hours) at 08/07/2019 [id number] Last data filed at 08/07/2019 0600 Gross per 24 hour  Intake 954.45 ml  Output 1200 ml  Net -245.55 ml   Filed Weights   08/05/19 0600 08/06/19 0217 08/07/19 0341  Weight: 79.3 kg 81.9 kg 81.1 kg   General Appearance:  General:  Critically ill appearing female sedated on MV in NAD HEENT: MM pink/moist, pupils 4/reactive, anicteric, ETT  At 24, OGT Neuro: open eyes to noxious- withdrawals  in all extremities, does not f/c CV: irir, no murmur PULM:  MV supported breaths, CTA, diminished in bases, no secretions, apneic on PSV GI: obese, +bs, ND, purwick  Extremities: warm/dry, generalized edema  Skin: no rashes   Resolved Hospital Problem list    Assessment & Plan:  This is a 73 yo F being admitted to ICU for witnessed out of hospital cardiac arrest, unclear etiology, with  field ROSC, resultant respiratory failure and encephalopathy s/p TTM at 36 degrees celsius   Out of hospital cardiac arrest - possibly precipitated by E Colii UTI +/- staph aureus bacteremia P:  Hemodynamically stable, goal MAP remains >65  abx as below Holding home norvasc, lasix, metoprolol for now- liable BP at times (? If driven by agitation)  Acute systolic heart failure (baseline normal EF in April 2020)  - ECHO 08/03/2019: LVEF: 20-25%, global hypokinesis, RV midly reduced function with normal size, mild MR, normal pulmonary pressures.  P:  Appreciate Cardiology input No BB/ ACEI given critical illness  Pending repeat TTE this am  Will need further ischemic workup/ LHC pending renal/ neurologic recovery    PAF  - on home eliquis - rate controlled, currently sinus but still with times of irregularity  P:  Cards following Ongoing amiodarone and heparin gtt  Goal Mag >2, K >4   Acute Encephlopathy - concern for post arrest anoxia - s/p 36C TTM - rewarmed 08/05/2019 -  08/07/2019 - improving encephalopathy. Able to follow some simple commands P:  Minimize sedation as able with ongoing neuro exams, d/c versed, consider precedex if needed, prn fentanyl RASS goal 0/-1 Consider MRI if no significant improvements in neuro exams   Acute hypoxic respiratory failure due to cardiac arrest  - CXR 10/26 stable ETT, ongoing bilateral effusions P: Full MV support, PRVC Daily SBT, minimize sedation as able Prn CXR Volume removal per iHD  Acute on chronic CKD IV, likely ATN component given cardiac arrest Congential Unilateral Kidney   -Baseline Cr: 2.7 - remains oliguric, renal US without evidence of obstruction  - started on iHD 10/25 P:  Appreciate Nephrology input iHD per Nephrology Continue purwick/ q shift bladder scan to monitor for retention Trend BMP / urinary output Replace electrolytes as indicated Avoid nephrotoxic agents, ensure adequate renal perfusion    E  colii UTI Staph aureus bacteremia- 1/2 on BC, ?possible contaminant  - improvement in leukocytosis 36-> 12.5 P: Continue cefazolin (abx started 10/22) Repeat TTE today, possibly consider TEE given cards recs  Anemia - Hgb trend 11.9- > 10- > 8.5-> 8.3 P:  No obvious signs of bleeding, monitor while on heparin Transfuse for Hgb <8 (given acute systolic HF)  Hx of mood disorder P:  Holding home depakote, abilify, cymbalta   Best practice:  Diet:  TF  Pain/Anxiety/Delirium protocol (if indicated): prn fentanyl  VAP protocol (if indicated): Yes DVT prophylaxis: Heparin gtt  GI prophylaxis: PPI  Glucose control: SSI Q4H - controlled Mobility: Bed rest  Code Status: FULL  Family Communication: pending Disposition: ICU    CCT 40 mins  Kennieth Rad, MSN, AGACNP-BC [facility] 08/07/2019, 9:33 AM

## 2019-08-07 NOTE — Progress Notes (Signed)
  Echocardiogram 2D Echocardiogram limited has been performed.  Jennette Dubin 08/07/2019, 10:42 AM

## 2019-08-07 NOTE — Progress Notes (Signed)
[facility] for Heparin Indication: atrial fibrillation  Allergies  Allergen Reactions  . Codeine Other (See Comments)    Reaction not recalled by family- was told to never take this    Patient Measurements: Height: '5\' 6"'  (167.6 cm) Weight: 178 lb 12.7 oz (81.1 kg) IBW/kg (Calculated) : 59.3 Heparin Dosing Weight: 78 kg  Vital Signs: Temp: 98.8 F (37.1 C) (10/26 0600) BP: 143/67 (10/26 0326) Pulse Rate: 70 (10/26 0600)  Labs: Recent Labs    08/04/19 0934  08/05/19 0802 08/05/19 1526  08/06/19 0203 08/06/19 0854 08/06/19 1433 08/07/19 0327  HGB  --    < >  --   --    < > 8.5*  --  8.5* 8.3*  HCT  --    < >  --   --    < > 27.8*  --  27.8* 26.5*  PLT  --    < >  --   --   --  158  --  148* 143*  APTT  --   --  66*  --   --  92*  --   --  69*  HEPARINUNFRC  --   --  0.95*  --   --  0.78*  --   --  0.43  CREATININE  --    < >  --  3.74*  --   --  3.82*  --  2.78*  TROPONINIHS 3,543*  --   --   --   --   --   --   --   --    < > = values in this interval not displayed.    Estimated Creatinine Clearance: 19.6 mL/min (A) (by C-G formula based on SCr of 2.78 mg/dL (H)).   Medical History: Past Medical History:  Diagnosis Date  . Anemia   . Chronic diastolic CHF (congestive heart failure) ([place])   . CKD (chronic kidney disease), stage IV ([place])   . Hypertension   . Persistent atrial fibrillation ([place])    a. dx 01/2019 s/p TEE DCCV.  Marland Kitchen Renal disorder   . Unilateral congenital absence of kidney     Medications:  Scheduled:  . atorvastatin  20 mg Per NG tube Daily  . chlorhexidine gluconate (MEDLINE KIT)  15 mL Mouth Rinse BID  . Chlorhexidine Gluconate Cloth  6 each Topical Daily  . feeding supplement (PRO-STAT SUGAR FREE 64)  30 mL Per Tube TID  . feeding supplement (VITAL HIGH PROTEIN)  1,000 mL Per Tube Q24H  . insulin aspart  0-9 Units Subcutaneous Q4H  . mouth rinse  15 mL Mouth Rinse 10 times per day  . pantoprazole  sodium  40 mg Per Tube QHS  . sodium chloride flush  10-40 mL Intracatheter Q12H   Infusions:  . sodium chloride 10 mL/hr at 08/07/19 0322  . sodium chloride 20 mL/hr at 08/07/19 0300  . sodium chloride    . sodium chloride    . amiodarone 30 mg/hr (08/07/19 0600)  .  ceFAZolin (ANCEF) IV Stopped (08/06/19 1940)  . heparin 1,000 Units/hr (08/07/19 0600)  . potassium chloride 10 mEq (08/07/19 5397)    Assessment: Pt is a 73 y/o female admitted s/p out of hospital cardiac arrest. ROSC was achieved and targeted temperature management was initiated. Pt has a history of A-fib (taking eliquis PTA), diastolic heart failure, HTN, HDL, IDDM, CKD. Pharmacy has been consulted to dose heparin for A-fib.  Baseline aPTT is 23  and heparin level is 1.22. Patient was on Eliquis PTA but last dose is unknown.   Heparin level came back therapeutic at 0.43, along with aPTT at 69, on 1000 units/hr. Hgb 8.3, plt 143. No s/sx of bleeding. No infusion issues.   Goal of Therapy:  Heparin level 0.3-0.7 units/ml aPTT 66-102 seconds Monitor platelets by anticoagulation protocol: Yes   Plan:  Continue heparin at 1000 units/hr Daily heparin level, CBC - no need for aPTT given correlation Will need to watch cbc closely given trends  Antonietta Jewel, PharmD, [place] Pharmacist  Phone: [phone]  Please check AMION for all [place] phone numbers After 10:00 PM, call [place] [phone] 08/07/2019 7:14 AM

## 2019-08-07 NOTE — Progress Notes (Signed)
PCCM Interval Note  Called for ongoing agitation despite fentanyl.  Weaned for several hours today.  Patient following simple commands.  Will start precedex for further agitation.     Kennieth Rad, MSN, AGACNP-BC [facility] 08/07/2019, 6:31 PM

## 2019-08-07 NOTE — Progress Notes (Signed)
eLink Physician-Brief Progress Note Patient Name: JENSEN CASSELLS DOB: 05-Mar-1946 MRN: [id number]   Date of Service  08/07/2019  HPI/Events of Note  K+ = 3.2 and Creatinine = 2.78.   eICU Interventions  Will cautiously replace K+.      Intervention Category Major Interventions: Electrolyte abnormality - evaluation and management  Sommer,Steven Eugene 08/07/2019, 5:33 AM

## 2019-08-07 NOTE — Progress Notes (Signed)
[facility]    NEPHROLOGY PROGRESS NOTE  SUBJECTIVE: Patient seen and examined earlier this morning in the ICU.  No acute events.  Minimal urine output.  Tolerated dialysis well yesterday.  OBJECTIVE:  Vitals:   08/07/19 1200 08/07/19 1300  BP: 106/64 124/82  Pulse: (!) 101 (!) 107  Resp: 20 (!) 22  Temp:    SpO2: 100% 100%    Intake/Output Summary (Last 24 hours) at 08/07/2019 1327 Last data filed at 08/07/2019 1200 Gross per 24 hour  Intake 2314.39 ml  Output 1200 ml  Net 1114.39 ml      General: Lethargic NAD HEENT: MMM [place] AT anicteric sclera Neck:  No JVD, no adenopathy CV:  Heart RRR  Lungs:  L/S CTA bilaterally Abd:  abd SNT/ND with normal BS GU:  Bladder non-palpable Extremities: +1 bilateral LE edema. Skin:  No skin rash  MEDICATIONS:  . atorvastatin  20 mg Per NG tube Daily  . chlorhexidine gluconate (MEDLINE KIT)  15 mL Mouth Rinse BID  . Chlorhexidine Gluconate Cloth  6 each Topical Daily  . feeding supplement (PRO-STAT SUGAR FREE 64)  30 mL Per Tube TID  . feeding supplement (VITAL HIGH PROTEIN)  1,000 mL Per Tube Q24H  . insulin aspart  0-9 Units Subcutaneous Q4H  . mouth rinse  15 mL Mouth Rinse 10 times per day  . pantoprazole sodium  40 mg Per Tube QHS  . sodium chloride flush  10-40 mL Intracatheter Q12H       LABS:   CBC Latest Ref Rng & Units 08/07/2019 08/06/2019 08/06/2019  WBC 4.0 - 10.5 K/uL 12.5(H) 16.3(H) 20.6(H)  Hemoglobin 12.0 - 15.0 g/dL 8.3(L) 8.5(L) 8.5(L)  Hematocrit 36.0 - 46.0 % 26.5(L) 27.8(L) 27.8(L)  Platelets 150 - 400 K/uL 143(L) 148(L) 158    CMP Latest Ref Rng & Units 08/07/2019 08/06/2019 08/05/2019  Glucose 70 - 99 mg/dL 119(H) 105(H) -  BUN 8 - 23 mg/dL 43(H) 57(H) -  Creatinine 0.44 - 1.00 mg/dL 2.78(H) 3.82(H) -  Sodium 135 - 145 mmol/L 138 139 138  Potassium 3.5 - 5.1 mmol/L 3.2(L) 3.2(L) 3.5  Chloride 98 - 111 mmol/L 104 109 -  CO2 22 - 32 mmol/L 20(L) 18(L) -  Calcium 8.9 - 10.3 mg/dL  7.9(L) 7.8(L) -  Total Protein 6.5 - 8.1 g/dL - - -  Total Bilirubin 0.3 - 1.2 mg/dL - - -  Alkaline Phos 38 - 126 U/L - - -  AST 15 - 41 U/L - - -  ALT 0 - 44 U/L - - -    Lab Results  Component Value Date   CALCIUM 7.9 (L) 08/07/2019   CAION 1.18 08/05/2019   PHOS 2.6 08/07/2019       Component Value Date/Time   COLORURINE BROWN (A) 08/05/2019 2018   APPEARANCEUR TURBID (A) 08/05/2019 2018   LABSPEC RESULTS UNAVAILABLE DUE TO INTERFERING SUBSTANCE 08/05/2019 2018   PHURINE RESULTS UNAVAILABLE DUE TO INTERFERING SUBSTANCE 08/05/2019 2018   GLUCOSEU RESULTS UNAVAILABLE DUE TO INTERFERING SUBSTANCE (A) 08/05/2019 2018   HGBUR RESULTS UNAVAILABLE DUE TO INTERFERING SUBSTANCE (A) 08/05/2019 2018   BILIRUBINUR RESULTS UNAVAILABLE DUE TO INTERFERING SUBSTANCE (A) 08/05/2019 2018   KETONESUR RESULTS UNAVAILABLE DUE TO INTERFERING SUBSTANCE (A) 08/05/2019 2018   PROTEINUR RESULTS UNAVAILABLE DUE TO INTERFERING SUBSTANCE (A) 08/05/2019 2018   UROBILINOGEN 0.2 05/07/2009 1520   NITRITE RESULTS UNAVAILABLE DUE TO INTERFERING SUBSTANCE (A) 08/05/2019 2018   LEUKOCYTESUR RESULTS UNAVAILABLE DUE TO INTERFERING SUBSTANCE (A) 08/05/2019  2018      Component Value Date/Time   PHART 7.362 08/05/2019 2002   PCO2ART 28.9 (L) 08/05/2019 2002   PO2ART 176.0 (H) 08/05/2019 2002   HCO3 16.5 (L) 08/05/2019 2002   TCO2 17 (L) 08/05/2019 2002   ACIDBASEDEF 8.0 (H) 08/05/2019 2002   O2SAT 100.0 08/05/2019 2002       Component Value Date/Time   IRON 23 (L) 01/28/2019 1325   TIBC 301 01/28/2019 1325   FERRITIN 45 01/28/2019 1325   IRONPCTSAT 8 (L) 01/28/2019 1325       ASSESSMENT/PLAN:     1.  Chronic kidney disease stage IV with a baseline GFR around 19-20.  Chronic kidney disease likely on the basis of longstanding hypertension in the setting of a solitary kidney.  2.  Acute kidney injury.  Renal ultrasound with no evidence of obstruction.  She is status post left nephrectomy.  Likely ATN in  the setting of shock, cardiac arrest, and E. coli sepsis.  Minimal urine output.  We will plan for dialysis again tomorrow.   3.  Status post cardiac arrest.  Hypothermia protocol finishing.  4.  Non-anion gap metabolic acidosis.  Likely secondary to acute kidney injury.    Improved with dialysis  5.  Hypokalemia.    Continue to supplement as needed.     [place], DO, [place]

## 2019-08-08 DIAGNOSIS — I4819 Other persistent atrial fibrillation: Secondary | ICD-10-CM | POA: Diagnosis not present

## 2019-08-08 DIAGNOSIS — R7881 Bacteremia: Secondary | ICD-10-CM | POA: Diagnosis not present

## 2019-08-08 DIAGNOSIS — B9561 Methicillin susceptible Staphylococcus aureus infection as the cause of diseases classified elsewhere: Secondary | ICD-10-CM | POA: Diagnosis not present

## 2019-08-08 DIAGNOSIS — R778 Other specified abnormalities of plasma proteins: Secondary | ICD-10-CM | POA: Diagnosis not present

## 2019-08-08 DIAGNOSIS — I5043 Acute on chronic combined systolic (congestive) and diastolic (congestive) heart failure: Secondary | ICD-10-CM

## 2019-08-08 DIAGNOSIS — I469 Cardiac arrest, cause unspecified: Secondary | ICD-10-CM | POA: Diagnosis not present

## 2019-08-08 LAB — RENAL FUNCTION PANEL
Albumin: 1.7 g/dL — ABNORMAL LOW (ref 3.5–5.0)
Anion gap: 10 (ref 5–15)
BUN: 61 mg/dL — ABNORMAL HIGH (ref 8–23)
CO2: 20 mmol/L — ABNORMAL LOW (ref 22–32)
Calcium: 8 mg/dL — ABNORMAL LOW (ref 8.9–10.3)
Chloride: 106 mmol/L (ref 98–111)
Creatinine, Ser: 2.9 mg/dL — ABNORMAL HIGH (ref 0.44–1.00)
GFR calc Af Amer: 18 mL/min — ABNORMAL LOW (ref >60)
GFR calc non Af Amer: 16 mL/min — ABNORMAL LOW (ref >60)
Glucose, Bld: 123 mg/dL — ABNORMAL HIGH (ref 70–99)
Phosphorus: 2.6 mg/dL (ref 2.5–4.6)
Potassium: 3.5 mmol/L (ref 3.5–5.1)
Sodium: 136 mmol/L (ref 135–145)

## 2019-08-08 LAB — CBC WITH DIFFERENTIAL/PLATELET
Abs Immature Granulocytes: 0.16 10*3/uL — ABNORMAL HIGH (ref 0.00–0.07)
Basophils Absolute: 0 10*3/uL (ref 0.0–0.1)
Basophils Relative: 0 %
Eosinophils Absolute: 0.1 10*3/uL (ref 0.0–0.5)
Eosinophils Relative: 1 %
HCT: 26.7 % — ABNORMAL LOW (ref 36.0–46.0)
Hemoglobin: 8.3 g/dL — ABNORMAL LOW (ref 12.0–15.0)
Immature Granulocytes: 2 %
Lymphocytes Relative: 13 %
Lymphs Abs: 1.3 10*3/uL (ref 0.7–4.0)
MCH: 29 pg (ref 26.0–34.0)
MCHC: 31.1 g/dL (ref 30.0–36.0)
MCV: 93.4 fL (ref 80.0–100.0)
Monocytes Absolute: 1.2 10*3/uL — ABNORMAL HIGH (ref 0.1–1.0)
Monocytes Relative: 11 %
Neutro Abs: 7.5 10*3/uL (ref 1.7–7.7)
Neutrophils Relative %: 73 %
Platelets: 137 10*3/uL — ABNORMAL LOW (ref 150–400)
RBC: 2.86 MIL/uL — ABNORMAL LOW (ref 3.87–5.11)
RDW: 16.9 % — ABNORMAL HIGH (ref 11.5–15.5)
WBC: 10.3 10*3/uL (ref 4.0–10.5)
nRBC: 0 % (ref 0.0–0.2)

## 2019-08-08 LAB — CULTURE, BLOOD (ROUTINE X 2): Culture: NO GROWTH

## 2019-08-08 LAB — GLUCOSE, CAPILLARY
Glucose-Capillary: 101 mg/dL — ABNORMAL HIGH (ref 70–99)
Glucose-Capillary: 116 mg/dL — ABNORMAL HIGH (ref 70–99)
Glucose-Capillary: 117 mg/dL — ABNORMAL HIGH (ref 70–99)
Glucose-Capillary: 117 mg/dL — ABNORMAL HIGH (ref 70–99)
Glucose-Capillary: 123 mg/dL — ABNORMAL HIGH (ref 70–99)
Glucose-Capillary: 125 mg/dL — ABNORMAL HIGH (ref 70–99)
Glucose-Capillary: 125 mg/dL — ABNORMAL HIGH (ref 70–99)

## 2019-08-08 LAB — MAGNESIUM: Magnesium: 1.9 mg/dL (ref 1.7–2.4)

## 2019-08-08 LAB — HEPARIN LEVEL (UNFRACTIONATED)
Heparin Unfractionated: 0.25 IU/mL — ABNORMAL LOW (ref 0.30–0.70)
Heparin Unfractionated: 0.33 IU/mL (ref 0.30–0.70)

## 2019-08-08 MED ORDER — SODIUM CHLORIDE 0.9% FLUSH: 3.0000 mL | INTRAVENOUS | Status: DC | PRN

## 2019-08-08 MED ORDER — SODIUM CHLORIDE 0.9% FLUSH
3.0000 mL | Freq: Two times a day (BID) | INTRAVENOUS | Status: DC
  Administered 2019-08-08 – 2019-08-10 (×4): 3 mL via INTRAVENOUS

## 2019-08-08 MED ORDER — SODIUM CHLORIDE 0.9 % IV SOLN: 250.0000 mL | INTRAVENOUS | Status: DC

## 2019-08-08 MED ORDER — HYDRALAZINE HCL 10 MG PO TABS
10.0000 mg | ORAL_TABLET | Freq: Four times a day (QID) | ORAL | Status: DC | PRN
  Filled 2019-08-08 (×2): qty 1

## 2019-08-08 NOTE — Progress Notes (Signed)
NAME:  Jaclyn Day, MRN:  [id number], DOB:  June 28, 1946, LOS: 5 ADMISSION DATE:  08/03/2019, CONSULTATION DATE:  10/22 REFERRING MD:  Dr. Sherril Croon, CHIEF COMPLAINT:  Post arrest    Brief History   73yo female admitted after out of hospital cardiac arrest, unclear etiology CPR started by fire 10 min CPR with 1 Epi before ROSC. PCCM called for admission and need for targeted temperature management and ventilator management.   Of note, admitted at this facility 01/25/2019-01/31/2019 for new onset A-fib with development of diastolic congestive heart failure, during admission she underwent TEE and cardioversion with transition to SR.   Past Medical History  Congenital unilateral kidney  CKD stage IV (baseline creatinine 2.2-2.8) w/hx of nephrectomy  Hypertension  Persistent A-fib on anticoagulation/ Eliquis Chronic diastolic CHF Anemia  Cardioversion and TEE 01/31/2019 Mood disorder  Significant Hospital Events   10/22 Admitted post arrest, intubated, initiation of targeted temperature management 36 celsius   10/23 + 800 ml I/O, TTM, Weaned off Levophed overnight, ECHO: LVEF: 20-25%, global hypokinesis, RV midly reduced function with normal size, mild MR, normal pulmonary pressures.  Lactate cleared  10/24 - 40% fio2, Oliguric v Anuric. On amio gtt, heparin gtt, neo gtt . Being weaned off neo gtt. Rewarmed to 37. Non purposeful only (not on sedation). Growing e colii in urine from admission  10/25 - staph identified in blood and vanc started yesterday. Still with poor Ur OP. Foley leak suspected and foley stopped. No new foley . RT says patient followed commands and wants to do SBT. On amio gtt. Off leveophed an dneo. On Heparin gtt.  iHD w/ 1L off   10/26-  following simple commands, placed on precedex for agitation, febrile- re-cultured and femoral CVL discontinued   Consults:  PCCM ID Cards for TEE  Procedures:  ETT 10/22 >> R radial Aline 10/22 >> 10/27 Right femoral CVC 10/22  >>10/26 R IJ trialysis 10/25 >>  Significant Diagnostic Tests:  10/22:  ECHO: LVEF: 20-25%, global hypokinesis, RV midly reduced function with normal size, mild MR, normal pulmonary pressures.  10/22: CTH without acute intracranial pathology  [place] 10/23 - IMPRESSION: 1. Status post left nephrectomy 2. Echogenic right kidney with cortical thinning consistent with medical renal disease. No hydronephrosis. Cysts, fewer than 10 in the right kidney. 3. Right pleural effusion  10/26 limited TTE >> EF 25-30%, mod reduced RV function  Micro Data:  Blood culture 10/22 >> staph nos 1/4 COVID 10/22 >> Negative RPP 10/22 >> Negative Urine 10/22 - E colii 10/26 BCx 2 >>  Antimicrobials:  Rocephin 10/22 >> 10/25 Vanc (staph in blood ) 10/24  Cefazolin 10/25 >>  Interim history/subjective:  Weaned x 2 hours yest and following simple commands Precedex off due to hypotension overnight Currently on iHD with plans for 3L removal  Afebrile   Objective   Blood pressure (!) 149/75, pulse (!) 105, temperature 98.9 F (37.2 C), temperature source Oral, resp. rate (!) 21, height 5\' 6"  (1.676 m), weight 82.6 kg, SpO2 99 %.    Vent Mode: PRVC FiO2 (%):  [30 %] 30 % Set Rate:  [20 bmp] 20 bmp Vt Set:  [470 mL] 470 mL PEEP:  [5 cmH20] 5 cmH20 Pressure Support:  [8 cmH20] 8 cmH20 Plateau Pressure:  [14 cmH20-19 cmH20] 19 cmH20   Intake/Output Summary (Last 24 hours) at 08/08/2019 0733 Last data filed at 08/08/2019 0700 Gross per 24 hour  Intake 2783.92 ml  Output 700 ml  Net 2083.92 ml  Filed Weights   08/07/19 0341 08/08/19 0400 08/08/19 0640  Weight: 81.1 kg 80.9 kg 82.6 kg   General Appearance:  General:  Elderly critically ill female on MV in NAD HEENT: MM pink/moist, ETT, OGT, pupils 4/reactive Neuro: Awake, intermittently follows simple commands, MAE CV: IRIR- afib rate controlled, no murmur PULM:  Synchronous with MV, breathing above set rate, lungs clear throughout, scant white  secretions GI: soft, bs active, purwick Extremities: warm/dry, generalized edema +1-2 Skin: no rashes, scattered ecchymosis to arms and knees  Resolved Hospital Problem list    Assessment & Plan:  This is a 73 yo F being admitted to ICU for witnessed out of hospital cardiac arrest, unclear etiology, with field ROSC, resultant respiratory failure and encephalopathy s/p TTM at 36 degrees celsius found to have E. Coli UTI and 1/4+ stap aureus on blood cultures  Out of hospital cardiac arrest - possibly precipitated by E Colii UTI +/- staph aureus bacteremia P:  Goal MAP > 65 D/c aline ID following, appreciate input Repeat BC sent 10/26- follow culture data Remains on cefazolin  Acute systolic heart failure (baseline normal EF in April 2020)  - ECHO 08/03/2019: LVEF: 20-25%, global hypokinesis, RV midly reduced function with normal size, mild MR, normal pulmonary pressures.  - TTE 10/26 shows EF 20-25% P:  Cards following, appreciate input Appreciate Cardiology input Will need further ischemic workup when critical illness resolved  No BB/ ACEi Volume removal per iHD  PAF  - on home eliquis P:  Cards following Currently rate controlled Amiodarone gtt per cards Ongoing heparin gtt  Acute Encephlopathy - concern for post arrest anoxia - s/p 36C TTM - rewarmed 08/05/2019 P:  Slow to improve, following simple commands D/c precedex given induced hypotension Prn fentanyl, avoid benzo's, RASS goal 0-/1  Acute hypoxic respiratory failure due to cardiac arrest  P: Full MV support SBT trials after iHD today but hold on extubation till TEE is arranged Prn CXR Volume removal per iHD   Acute on chronic CKD IV, likely ATN component given cardiac arrest Congential Unilateral Kidney   -Baseline Cr: 2.7 - remains oliguric, renal US without evidence of obstruction  - started on iHD 10/25 P:  Appreciate nephrology assistance iHD today  Electrolyte replacement per HD Ongoing  purwick/ monitor strict I/Os Trend BMP / phos/ mag    E colii UTI Staph aureus bacteremia- 1/4 on BC, ?possible contaminant   P: ID following Will need TEE to rule out IE Repeat [facility] sent 10/26 after femoral CVL removed Continue cefazolin (abx started 10/22) Trend fever / WBC curve  Anemia - Hgb trend 11.9- > 10- > 8.5-> 8.3- 8.3 P:  Stable Trending CBC   Hx of mood disorder P:  Holding home depakote, abilify, cymbalta   Best practice:  Diet:  TF at goal  Pain/Anxiety/Delirium protocol (if indicated): prn fentanyl  VAP protocol (if indicated): Yes DVT prophylaxis: Heparin gtt  GI prophylaxis: PPI  Glucose control: SSI Q4H - remains controlled Mobility: Bed rest  Code Status: FULL  Family Communication: longterm boyfriend (states HPOA as well) updated at bedside yesterday, pts sister approves giving information and making decisions to him Disposition: ICU    CCT 35 mins  Kennieth Rad, MSN, AGACNP-BC [facility] 08/08/2019, 7:33 AM

## 2019-08-08 NOTE — Progress Notes (Signed)
[place] for Heparin Indication: atrial fibrillation  Allergies  Allergen Reactions  . Codeine Other (See Comments)    Reaction not recalled by family- was told to never take this    Patient Measurements: Height: 5\' 6"  (167.6 cm) Weight: 178 lb 12.7 oz (81.1 kg) IBW/kg (Calculated) : 59.3 Heparin Dosing Weight: 78 kg  Vital Signs: Temp: 98.7 F (37.1 C) (10/27 0400) Temp Source: Oral (10/27 0400) BP: 127/80 (10/27 0400) Pulse Rate: 97 (10/27 0400)  Labs: Recent Labs    08/05/19 0802  08/06/19 0203 08/06/19 0854 08/06/19 1433 08/07/19 0327 08/08/19 0320  HGB  --    < > 8.5*  --  8.5* 8.3* 8.3*  HCT  --    < > 27.8*  --  27.8* 26.5* 26.7*  PLT  --   --  158  --  148* 143* 137*  APTT 66*  --  92*  --   --  69*  --   HEPARINUNFRC 0.95*  --  0.78*  --   --  0.43 0.25*  CREATININE  --    < >  --  3.82*  --  2.78* 2.90*   < > = values in this interval not displayed.    Estimated Creatinine Clearance: 18.8 mL/min (A) (by C-G formula based on SCr of 2.9 mg/dL (H)).  Assessment: 73 y.o. female with h/o Afib, Eliquis on hold, for heparin   Goal of Therapy:  Heparin level 0.3 -0.7 units/mL Monitor platelets by anticoagulation protocol: Yes   Plan:  Increase Heparin 1100 units/hr  Phillis Knack, PharmD, BCPS  08/08/2019 4:51 AM

## 2019-08-08 NOTE — Progress Notes (Signed)
[facility]ious [facility]  Date of Admission:  08/03/2019      Total days of antibiotics 6  Cefazolin day 3           ASSESSMENT: Jaclyn Day is a 73 y.o. female female who unfortunately underwent cardiac arrest outside the hospital. Found to have MSSA bacteremia and significantly depressed LVEF (20-25%) in the setting of AFib RVR following arrest. Leukocytosis has resolved and she has been afebrile.   The source is unclear for her bacteremia; TEE to be done tomorrow +/- cardioversion if neg for thrombus. This will help define duration of treatment. Uncertain her HD plans after today considering this is a new acute need. She is getting 1gm cefazolin daily for now.    PLAN: 1. TEE tomorrow to assess for endocarditis 2. Continue cefazolin 1gm QD 3. Vent management per critical care     Principal Problem:   MSSA bacteremia Active Problems:   Cardiac arrest ([place])   Pressure injury of skin   Acute respiratory failure with hypoxia (HCC)   AKI (acute kidney injury) ([place])   Elevated troponin   Persistent atrial fibrillation (HCC)   Acute on chronic combined systolic and diastolic CHF (congestive heart failure) ([place])   . atorvastatin  20 mg Per NG tube Daily  . chlorhexidine gluconate (MEDLINE KIT)  15 mL Mouth Rinse BID  . Chlorhexidine Gluconate Cloth  6 each Topical Daily  . feeding supplement (PRO-STAT SUGAR FREE 64)  30 mL Per Tube TID  . feeding supplement (VITAL HIGH PROTEIN)  1,000 mL Per Tube Q24H  . insulin aspart  0-9 Units Subcutaneous Q4H  . mouth rinse  15 mL Mouth Rinse 10 times per day  . pantoprazole sodium  40 mg Per Tube QHS  . sodium chloride flush  10-40 mL Intracatheter Q12H  . sodium chloride flush  3 mL Intravenous Q12H    SUBJECTIVE: Weaning on ventilator and more awake.   Review of Systems: Review of Systems  Unable to perform ROS: Intubated    Allergies  Allergen Reactions  . Codeine Other (See Comments)   Reaction not recalled by family- was told to never take this    OBJECTIVE: Vitals:   08/08/19 1048 08/08/19 1100 08/08/19 1110 08/08/19 1222  BP: 102/64 122/84 122/84   Pulse: (!) 113 94 (!) 103   Resp: 18 (!) 23 (!) 23   Temp: 97.6 F (36.4 C)     TempSrc: Oral     SpO2: 99% 99% 99% 99%  Weight: 80 kg     Height:       Body mass index is 28.47 kg/m.  Physical Exam Constitutional:      Comments: Resting in bed breathing comfortably on vent. Moves all extrem x 4   HENT:     Mouth/Throat:     Mouth: Mucous membranes are moist.     Pharynx: Oropharynx is clear.  Eyes:     General: No scleral icterus.    Pupils: Pupils are equal, round, and reactive to light.  Cardiovascular:     Rate and Rhythm: Tachycardia present. Rhythm irregular.     Heart sounds: No murmur.     Comments: afib on tele Pulmonary:     Effort: Pulmonary effort is normal.     Breath sounds: Normal breath sounds. No rhonchi.  Abdominal:     General: Bowel sounds are normal. There is no distension.  Genitourinary:    Comments: Undergoing  iHD now via R neck HD cath  Skin:    General: Skin is warm and dry.     Capillary Refill: Capillary refill takes less than 2 seconds.  Neurological:     Mental Status: She is alert and oriented to person, place, and time.  Psychiatric:     Comments: Calm; more awake     Lab Results Lab Results  Component Value Date   WBC 10.3 08/08/2019   HGB 8.3 (L) 08/08/2019   HCT 26.7 (L) 08/08/2019   MCV 93.4 08/08/2019   PLT 137 (L) 08/08/2019    Lab Results  Component Value Date   CREATININE 2.90 (H) 08/08/2019   BUN 61 (H) 08/08/2019   NA 136 08/08/2019   K 3.5 08/08/2019   CL 106 08/08/2019   CO2 20 (L) 08/08/2019    Lab Results  Component Value Date   ALT 20 08/03/2019   AST 26 08/03/2019   ALKPHOS 94 08/03/2019   BILITOT 1.2 08/03/2019     Microbiology: Recent Results (from the past 240 hour(s))  Blood culture (routine x 2)     Status: None    Collection Time: 08/03/19 10:33 AM   Specimen: BLOOD RIGHT HAND  Result Value Ref Range Status   Specimen Description BLOOD RIGHT HAND  Final   Special Requests   Final    BOTTLES DRAWN AEROBIC ONLY Blood Culture results may not be optimal due to an inadequate volume of blood received in culture bottles   Culture   Final    NO GROWTH 5 DAYS Performed at [facility], [place] [address], [place], [place] [address]    Report Status 08/08/2019 FINAL  Final  Blood culture (routine x 2)     Status: Abnormal   Collection Time: 08/03/19 10:48 AM   Specimen: BLOOD  Result Value Ref Range Status   Specimen Description BLOOD LEFT FEMORAL ARTERY  Final   Special Requests   Final    BOTTLES DRAWN AEROBIC AND ANAEROBIC Blood Culture adequate volume   Culture  Setup Time   Final    GRAM POSITIVE COCCI IN CLUSTERS AEROBIC BOTTLE ONLY CRITICAL RESULT CALLED TO, READ BACK BY AND VERIFIED WITH: J. LEDFORD,PHARMD 0207 08/04/2019 Mena Goes Performed at [facility], [place] [address] [place], [place] [address]    Culture STAPHYLOCOCCUS AUREUS (A)  Final   Report Status 08/06/2019 FINAL  Final   Organism ID, Bacteria STAPHYLOCOCCUS AUREUS  Final      Susceptibility   Staphylococcus aureus - MIC*    CIPROFLOXACIN <=0.5 SENSITIVE Sensitive     ERYTHROMYCIN <=0.25 SENSITIVE Sensitive     GENTAMICIN <=0.5 SENSITIVE Sensitive     OXACILLIN <=0.25 SENSITIVE Sensitive     TETRACYCLINE <=1 SENSITIVE Sensitive     VANCOMYCIN <=0.5 SENSITIVE Sensitive     TRIMETH/SULFA <=10 SENSITIVE Sensitive     CLINDAMYCIN <=0.25 SENSITIVE Sensitive     RIFAMPIN <=0.5 SENSITIVE Sensitive     Inducible Clindamycin NEGATIVE Sensitive     * STAPHYLOCOCCUS AUREUS  SARS Coronavirus 2 by RT PCR (hospital order, performed in [facility] lab) Nasopharyngeal Nasopharyngeal Swab     Status: None   Collection Time: 08/03/19 11:35 AM   Specimen: Nasopharyngeal Swab  Result Value Ref Range Status   SARS Coronavirus  2 NEGATIVE NEGATIVE Final    Comment: (NOTE) If result is NEGATIVE SARS-CoV-2 target nucleic acids are NOT DETECTED. The SARS-CoV-2 RNA is generally detectable in upper and lower  respiratory  specimens during the acute phase of infection. The lowest  concentration of SARS-CoV-2 viral copies this assay can detect is 250  copies / mL. A negative result does not preclude SARS-CoV-2 infection  and should not be used as the sole basis for treatment or other  patient management decisions.  A negative result may occur with  improper specimen collection / handling, submission of specimen other  than nasopharyngeal swab, presence of viral mutation(s) within the  areas targeted by this assay, and inadequate number of viral copies  (<250 copies / mL). A negative result must be combined with clinical  observations, patient history, and epidemiological information. If result is POSITIVE SARS-CoV-2 target nucleic acids are DETECTED. The SARS-CoV-2 RNA is generally detectable in upper and lower  respiratory specimens dur ing the acute phase of infection.  Positive  results are indicative of active infection with SARS-CoV-2.  Clinical  correlation with patient history and other diagnostic information is  necessary to determine patient infection status.  Positive results do  not rule out bacterial infection or co-infection with other viruses. If result is PRESUMPTIVE POSTIVE SARS-CoV-2 nucleic acids MAY BE PRESENT.   A presumptive positive result was obtained on the submitted specimen  and confirmed on repeat testing.  While 2019 novel coronavirus  (SARS-CoV-2) nucleic acids may be present in the submitted sample  additional confirmatory testing may be necessary for epidemiological  and / or clinical management purposes  to differentiate between  SARS-CoV-2 and other Sarbecovirus currently known to infect humans.  If clinically indicated additional testing with an alternate test  methodology  [phone]) is advised. The SARS-CoV-2 RNA is generally  detectable in upper and lower respiratory sp ecimens during the acute  phase of infection. The expected result is Negative. Fact Sheet for Patients:  StrictlyIdeas.no Fact Sheet for Healthcare Providers: BankingDealers.co.za This test is not yet approved or cleared by the [place] FDA and has been authorized for detection and/or diagnosis of SARS-CoV-2 by FDA under an Emergency Use Authorization (EUA).  This EUA will remain in effect (meaning this test can be used) for the duration of the COVID-19 declaration under Section 564(b)(1) of the Act, 21 U.S.C. section 360bbb-3(b)(1), unless the authorization is terminated or revoked sooner. Performed at [facility], [place] [address] [place], [place] [address]   Culture, Urine     Status: Abnormal   Collection Time: 08/03/19  1:13 PM   Specimen: Urine, Random  Result Value Ref Range Status   Specimen Description URINE, RANDOM  Final   Special Requests   Final    NONE Performed at [facility], [place] [address], [place], [place] [address]    Culture >=100,000 COLONIES/mL ESCHERICHIA COLI (A)  Final   Report Status 08/05/2019 FINAL  Final   Organism ID, Bacteria ESCHERICHIA COLI (A)  Final      Susceptibility   Escherichia coli - MIC*    AMPICILLIN >=32 RESISTANT Resistant     CEFAZOLIN 8 SENSITIVE Sensitive     CEFTRIAXONE <=1 SENSITIVE Sensitive     CIPROFLOXACIN >=4 RESISTANT Resistant     GENTAMICIN >=16 RESISTANT Resistant     IMIPENEM <=0.25 SENSITIVE Sensitive     NITROFURANTOIN <=16 SENSITIVE Sensitive     TRIMETH/SULFA >=320 RESISTANT Resistant     AMPICILLIN/SULBACTAM 16 INTERMEDIATE Intermediate     PIP/TAZO <=4 SENSITIVE Sensitive     Extended ESBL NEGATIVE Sensitive     * >=100,000 COLONIES/mL ESCHERICHIA COLI  MRSA PCR Screening  Status: None   Collection Time: 08/03/19  2:46 PM   Specimen:  Nasopharyngeal  Result Value Ref Range Status   MRSA by PCR NEGATIVE NEGATIVE Final    Comment:        The GeneXpert MRSA Assay (FDA approved for NASAL specimens only), is one component of a comprehensive MRSA colonization surveillance program. It is not intended to diagnose MRSA infection nor to guide or monitor treatment for MRSA infections. Performed at [facility], [place] [address] [place], [place] [address]   Respiratory Panel by PCR     Status: None   Collection Time: 08/03/19  3:56 PM   Specimen: Nasopharyngeal Swab; Respiratory  Result Value Ref Range Status   Adenovirus NOT DETECTED NOT DETECTED Final   Coronavirus 229E NOT DETECTED NOT DETECTED Final    Comment: (NOTE) The Coronavirus on the Respiratory Panel, DOES NOT test for the novel  Coronavirus (2019 nCoV)    Coronavirus HKU1 NOT DETECTED NOT DETECTED Final   Coronavirus NL63 NOT DETECTED NOT DETECTED Final   Coronavirus OC43 NOT DETECTED NOT DETECTED Final   Metapneumovirus NOT DETECTED NOT DETECTED Final   Rhinovirus / Enterovirus NOT DETECTED NOT DETECTED Final   Influenza A NOT DETECTED NOT DETECTED Final   Influenza B NOT DETECTED NOT DETECTED Final   Parainfluenza Virus 1 NOT DETECTED NOT DETECTED Final   Parainfluenza Virus 2 NOT DETECTED NOT DETECTED Final   Parainfluenza Virus 3 NOT DETECTED NOT DETECTED Final   Parainfluenza Virus 4 NOT DETECTED NOT DETECTED Final   Respiratory Syncytial Virus NOT DETECTED NOT DETECTED Final   Bordetella pertussis NOT DETECTED NOT DETECTED Final   Chlamydophila pneumoniae NOT DETECTED NOT DETECTED Final   Mycoplasma pneumoniae NOT DETECTED NOT DETECTED Final    Comment: Performed at [facility], [place]. [address], [place], [place] [address]  Culture, blood (routine x 2)     Status: None (Preliminary result)   Collection Time: 08/07/19  6:29 PM   Specimen: BLOOD RIGHT ARM  Result Value Ref Range Status   Specimen Description BLOOD RIGHT ARM  Final    Special Requests   Final    BOTTLES DRAWN AEROBIC ONLY Blood Culture results may not be optimal due to an inadequate volume of blood received in culture bottles   Culture   Final    NO GROWTH < 24 HOURS Performed at [facility], [place] [address], [place], [place] [address]    Report Status PENDING  Incomplete  Culture, blood (routine x 2)     Status: None (Preliminary result)   Collection Time: 08/07/19  6:34 PM   Specimen: BLOOD RIGHT ARM  Result Value Ref Range Status   Specimen Description BLOOD RIGHT ARM  Final   Special Requests   Final    BOTTLES DRAWN AEROBIC ONLY Blood Culture results may not be optimal due to an inadequate volume of blood received in culture bottles   Culture   Final    NO GROWTH < 24 HOURS Performed at [facility], [place] [address] [place], [place] [address]    Report Status PENDING  Incomplete     Janene Madeira, MSN, NP-C [facility] Infectious Disease [email or web address] Pager: [phone] Office: [phone] [place]: [phone]

## 2019-08-08 NOTE — TOC Initial Note (Signed)
Transition of Care [facility]) - Initial/Assessment Note    Patient Details  Name: Jaclyn Day MRN: [id number] Date of Birth: 1946-10-01  [facility] CM/SW Contact:    Bethena Roys, RN Phone Number: 08/08/2019, 1:12 PM  Clinical Narrative:   Pt presented for acute respiratory failure and cardiac arrest. Initiated on dialysis 08-06-19. Plan for TEE 10-28-7-20. PTA from home with support of boyfriend 66. Per [name], patient has DME RW, Cane, WC, CPAP, 3n1 and adjustable bed in the home. Patient's PCP is Cyndi Bender and [facility] [place] [facility]. CM will continue to monitor for additional transiiton of care needs.                Expected Discharge Plan: Skilled Nursing Facility Barriers to Discharge: Continued Medical Work up    Expected Discharge Plan and Services Expected Discharge Plan: [place] In-house Referral: NA Discharge Planning Services: CM Consult   Living arrangements for the past 2 months: Single Family Home                  Prior Living Arrangements/Services Living arrangements for the past 2 months: Single Family Home Lives with:: Significant Other([name] boyfriend at the bedside.) Patient language and need for interpreter reviewed:: Yes        Need for Family Participation in Patient Care: Yes (Comment) Care giver support system in place?: Yes (comment) Current home services: DME(Pt has Cane RW, 3n1, WC and adjustable bed.) Criminal Activity/Legal Involvement Pertinent to Current Situation/Hospitalization: No - Comment as needed  Activities of Daily Living Home Assistive Devices/Equipment: None ADL Screening (condition at time of admission) Patient's cognitive ability adequate to safely complete daily activities?: Yes Is the patient deaf or have difficulty hearing?: Yes Does the patient have difficulty seeing, even when wearing glasses/contacts?: No Does the patient have difficulty concentrating,  remembering, or making decisions?: No Patient able to express need for assistance with ADLs?: Yes Does the patient have difficulty dressing or bathing?: No Independently performs ADLs?: No Does the patient have difficulty walking or climbing stairs?: Yes Weakness of Legs: Left Weakness of Arms/Hands: None  Permission Sought/Granted Permission sought to share information with : Family Supports                Emotional Assessment Appearance:: Appears stated age Attitude/Demeanor/Rapport: Unable to Assess Affect (typically observed): Unable to Assess   Alcohol / Substance Use: Not Applicable Psych Involvement: No (comment)  Admission diagnosis:  cpr Patient Active Problem List   Diagnosis Date Noted  . Acute on chronic combined systolic and diastolic CHF (congestive heart failure) ([place])   . MSSA bacteremia 08/07/2019  . AKI (acute kidney injury) ([place])   . Elevated troponin   . Persistent atrial fibrillation ([place])   . Acute respiratory failure with hypoxia ([place])   . Cardiac arrest ([place]) 08/03/2019  . Pressure injury of skin 08/03/2019  . Atrial fibrillation with RVR ([place]) 01/25/2019  . CHF (congestive heart failure) ([place]) 01/25/2019  . Hypoxia   . Renal insufficiency   . Renal disorder   . Hypertension    PCP:  Cyndi Bender, PA-C [id number] [place], [place] [address] Phone: [phone] Fax: [phone]     Social Determinants of Health (SDOH) Interventions    Readmission Risk Interventions No flowsheet data found.

## 2019-08-08 NOTE — Progress Notes (Signed)
[facility] for Heparin Indication: atrial fibrillation  Allergies  Allergen Reactions  . Codeine Other (See Comments)    Reaction not recalled by family- was told to never take this    Patient Measurements: Height: '5\' 6"'  (167.6 cm) Weight: 176 lb 5.9 oz (80 kg) IBW/kg (Calculated) : 59.3 Heparin Dosing Weight: 78 kg  Vital Signs: Temp: 97.6 F (36.4 C) (10/27 1200) Temp Source: Oral (10/27 1200) BP: 132/68 (10/27 1500) Pulse Rate: 74 (10/27 1500)  Labs: Recent Labs    08/06/19 0203 08/06/19 0854 08/06/19 1433 08/07/19 0327 08/08/19 0320 08/08/19 1423  HGB 8.5*  --  8.5* 8.3* 8.3*  --   HCT 27.8*  --  27.8* 26.5* 26.7*  --   PLT 158  --  148* 143* 137*  --   APTT 92*  --   --  69*  --   --   HEPARINUNFRC 0.78*  --   --  0.43 0.25* 0.33  CREATININE  --  3.82*  --  2.78* 2.90*  --     Estimated Creatinine Clearance: 18.7 mL/min (A) (by C-G formula based on SCr of 2.9 mg/dL (H)).   Medical History: Past Medical History:  Diagnosis Date  . Anemia   . Chronic diastolic CHF (congestive heart failure) ([place])   . CKD (chronic kidney disease), stage IV ([place])   . Hypertension   . Persistent atrial fibrillation ([place])    a. dx 01/2019 s/p TEE DCCV.  Marland Kitchen Renal disorder   . Unilateral congenital absence of kidney     Medications:  Scheduled:  . atorvastatin  20 mg Per NG tube Daily  . chlorhexidine gluconate (MEDLINE KIT)  15 mL Mouth Rinse BID  . Chlorhexidine Gluconate Cloth  6 each Topical Daily  . feeding supplement (PRO-STAT SUGAR FREE 64)  30 mL Per Tube TID  . feeding supplement (VITAL HIGH PROTEIN)  1,000 mL Per Tube Q24H  . insulin aspart  0-9 Units Subcutaneous Q4H  . mouth rinse  15 mL Mouth Rinse 10 times per day  . pantoprazole sodium  40 mg Per Tube QHS  . sodium chloride flush  10-40 mL Intracatheter Q12H  . sodium chloride flush  3 mL Intravenous Q12H   Infusions:  . sodium chloride 10 mL/hr at 08/07/19 0322  .  sodium chloride 20 mL/hr at 08/07/19 0300  . sodium chloride    . sodium chloride    . sodium chloride    . amiodarone 30 mg/hr (08/08/19 1500)  .  ceFAZolin (ANCEF) IV Stopped (08/07/19 2012)  . heparin 1,100 Units/hr (08/08/19 1500)    Assessment: Pt is a 73 y/o female admitted s/p out of hospital cardiac arrest. ROSC was achieved and targeted temperature management was initiated. Pt has a history of A-fib (taking eliquis PTA), diastolic heart failure, HTN, HDL, IDDM, CKD. Pharmacy has been consulted to dose heparin for A-fib.  Baseline aPTT is 23 and heparin level is 1.22. Patient was on Eliquis PTA but last dose is unknown.   Heparin level came back therapeutic at 0.33, on 1100 units/hr. Hgb 8.3, plt 137. No s/sx of bleeding. No infusion issues.   Goal of Therapy:  Heparin level 0.3-0.7 units/ml aPTT 66-102 seconds Monitor platelets by anticoagulation protocol: Yes   Plan:  Continue heparin at 1100 units/hr Daily heparin level, CBC Will need to watch cbc closely given trends  Antonietta Jewel, PharmD, BCCCP Clinical Pharmacist  Phone: [phone]  Please check AMION for all [facility]  Pharmacy phone numbers After 10:00 PM, call [place] [phone] 08/08/2019 3:16 PM

## 2019-08-08 NOTE — Progress Notes (Signed)
eLink Physician-Brief Progress Note Patient Name: AMILLIANA PASCHE DOB: 1946-07-04 MRN: [id number]   Date of Service  08/08/2019  HPI/Events of Note  Hypotension - SBP = 88. Likely d/t Precedex IV infusion and systolic HF with EF = [id number].  eICU Interventions  Plan: 1. Please sedate with Fentanyl PRN as ordered.  2. Wean Precedex off as tolerated.  3. Monitor CVP via HD cath pigtail if hypotension persists.      Intervention Category Major Interventions: Hypotension - evaluation and management  Lusia Greis [place] 08/08/2019, 2:07 AM

## 2019-08-08 NOTE — Progress Notes (Addendum)
Progress Note  Patient Name: Jaclyn Day Date of Encounter: 08/08/2019  Primary Cardiologist: Ena Dawley, MD   Subjective  O/N events: Repeat Echo showing reduced EF 25-30%. Agitated overnight requiring precedex.  [name] was examined and evaluated at bedside this AM. She was observed opening eyes to verbal stimuli and was able to follow simple directions.  Inpatient Medications    Scheduled Meds:  atorvastatin  20 mg Per NG tube Daily   chlorhexidine gluconate (MEDLINE KIT)  15 mL Mouth Rinse BID   Chlorhexidine Gluconate Cloth  6 each Topical Daily   feeding supplement (PRO-STAT SUGAR FREE 64)  30 mL Per Tube TID   feeding supplement (VITAL HIGH PROTEIN)  1,000 mL Per Tube Q24H   insulin aspart  0-9 Units Subcutaneous Q4H   mouth rinse  15 mL Mouth Rinse 10 times per day   pantoprazole sodium  40 mg Per Tube QHS   sodium chloride flush  10-40 mL Intracatheter Q12H   Continuous Infusions:  sodium chloride 10 mL/hr at 08/07/19 0322   sodium chloride 20 mL/hr at 08/07/19 0300   sodium chloride     sodium chloride     amiodarone 30 mg/hr (08/08/19 0600)    ceFAZolin (ANCEF) IV Stopped (08/07/19 2012)   dexmedetomidine (PRECEDEX) IV infusion Stopped (08/08/19 0509)   heparin 1,100 Units/hr (08/08/19 0600)   PRN Meds: sodium chloride, sodium chloride, alteplase, fentaNYL (SUBLIMAZE) injection, heparin, sodium chloride flush   Vital Signs    Vitals:   08/08/19 0545 08/08/19 0600 08/08/19 0630 08/08/19 0700  BP:  (!) 144/81 (!) 156/109 (!) 156/104  Pulse: 100 88 89 87  Resp: (!) 40 11 (!) 23 14  Temp:      TempSrc:      SpO2: 100% 100% 100% 99%  Weight:      Height:        Intake/Output Summary (Last 24 hours) at 08/08/2019 0702 Last data filed at 08/08/2019 0600 Gross per 24 hour  Intake 2716.2 ml  Output 700 ml  Net 2016.2 ml   Filed Weights   08/06/19 0217 08/07/19 0341 08/08/19 0400  Weight: 81.9 kg 81.1 kg 80.9 kg     Telemetry    Sinus rhythm with SVT O/N, PVCS - Personally Reviewed  ECG    A.fib with PVC's HR 90-100 - Personally Reviewed  Physical Exam   Gen: Well-developed, well nourished, NAD HEENT: NCAT head, hearing intact, ETtube in place Neck: supple, ROM intact, no JVD CV: Irregularly irregular, S1, S2 normal, No rubs, no murmurs, no gallops Pulm: CTAB, No rales, no wheezes Abd: Soft, BS+, NTND, No rebound Extm: ROM intact, Peripheral pulses intact, 1+ pitting edema lower extremities Skin: Dry, Warm, normal turgor, no rashes, lesions, wounds.  Neuro: Alert  Labs    Chemistry Recent Labs  Lab 08/03/19 1013  08/06/19 0854 08/07/19 0327 08/08/19 0320  NA 142   < > 139 138 136  K 3.7   < > 3.2* 3.2* 3.5  CL 111   < > 109 104 106  CO2 18*   < > 18* 20* 20*  GLUCOSE 145*   < > 105* 119* 123*  BUN 37*   < > 57* 43* 61*  CREATININE 2.64*   < > 3.82* 2.78* 2.90*  CALCIUM 8.2*   < > 7.8* 7.9* 8.0*  PROT 4.8*  --   --   --   --   ALBUMIN 2.3*  --   --   --  1.7*  AST 26  --   --   --   --   ALT 20  --   --   --   --   ALKPHOS 94  --   --   --   --   BILITOT 1.2  --   --   --   --   GFRNONAA 17*   < > 11* 16* 16*  GFRAA 20*   < > 13* 19* 18*  ANIONGAP 13   < > _0 < > = values in this interval not displayed.     Hematology Recent Labs  Lab 08/06/19 1433 08/07/19 0327 08/08/19 0320  WBC 16.3* 12.5* 10.3  RBC 2.89* 2.83* 2.86*  HGB 8.5* 8.3* 8.3*  HCT 27.8* 26.5* 26.7*  MCV 96.2 93.6 93.4  MCH 29.4 29.3 29.0  MCHC 30.6 31.3 31.1  RDW 17.0* 17.0* 16.9*  PLT 148* 143* 137*    Cardiac EnzymesNo results for input(s): TROPONINI in the last 168 hours. No results for input(s): TROPIPOC in the last 168 hours.   BNPNo results for input(s): BNP, PROBNP in the last 168 hours.   DDimer  Recent Labs  Lab 08/03/19 1239  DDIMER 11.22*     Radiology    Dg Chest Port 1 View  Result Date: 08/07/2019 CLINICAL DATA:  Endotracheal tube placement, respiratory  failure. EXAM: PORTABLE CHEST 1 VIEW COMPARISON:  08/06/2019 FINDINGS: Endotracheal tube has tip 2.4 cm above the carina. PH probe noted of the proximal to mid esophagus. Enteric tube courses into the region of the stomach and off the film as tip is not visualized. Right IJ central venous catheter unchanged. Lungs are adequately inflated with continued hazy opacification over the right perihilar region and lung bases. Likely small amount left pleural fluid. Mild stable cardiomegaly. Remainder of the exam is unchanged. IMPRESSION: Persistent hazy density over the right perihilar region and lung bases suggesting interstitial edema versus infection. Small left effusion. Mild stable cardiomegaly. Tubes and lines as described. Electronically Signed   By: Marin Olp M.D.   On: 08/07/2019 07:20   Dg Chest Port 1 View  Result Date: 08/06/2019 CLINICAL DATA:  Central line placement EXAM: PORTABLE CHEST 1 VIEW COMPARISON:  Chest radiograph from earlier today. FINDINGS: Endotracheal tube tip is 1.9 cm above the carina. Enteric tube enters stomach with the tip not seen on this image. Esophageal temperature probe terminates over the lower third of the thoracic esophagus. Right internal jugular central venous catheter terminates over the cavoatrial junction. Stable cardiomediastinal silhouette with mild cardiomegaly. No pneumothorax. Probable small stable right pleural effusion. No left pleural effusion. Mild hazy right parahilar opacity is stable. Stable patchy left lung base opacity. IMPRESSION: 1. No pneumothorax status post right internal jugular central venous catheter placement. 2. Endotracheal tube tip 1.9 cm above the carina. 3. Stable mild cardiomegaly. Stable mild hazy right parahilar opacity, favor mild pulmonary edema and/or atelectasis. 4. Stable small right pleural effusion. 5. Stable patchy left lung base opacity, favor atelectasis. Electronically Signed   By: Ilona Sorrel M.D.   On: 08/06/2019 13:55   Dg  Abd Portable 1v  Result Date: 08/07/2019 CLINICAL DATA:  OG tube placement EXAM: PORTABLE ABDOMEN - 1 VIEW COMPARISON:  None. FINDINGS: Enteric tube terminates in the region of the pylorus. Cholecystectomy clips. Mild blunting of the left costophrenic angle. IMPRESSION: Enteric tube terminates in the region of the pylorus. Electronically Signed   By: Henderson Newcomer.D.  On: 08/07/2019 10:31    Cardiac Studies   TTE 08/03/19  1. Left ventricular ejection fraction, by visual estimation, is 20 to 25%. The left ventricle has severely decreased function. Normal left ventricular size. There is moderately increased left ventricular hypertrophy. Severe global hypokinesis.  2. Left ventricular diastolic function could not be evaluated pattern of LV diastolic filling.  3. Global right ventricle has mildly reduced systolic function.The right ventricular size is normal. No increase in right ventricular wall thickness.  4. Left atrial size was severely dilated.  5. Right atrial size was normal.  6. The mitral valve is abnormal. Mild mitral valve regurgitation.  7. The tricuspid valve is grossly normal. Tricuspid valve regurgitation is trivial.  8. The aortic valve is tricuspid Aortic valve regurgitation was not visualized by color flow Doppler.  9. The pulmonic valve was grossly normal. Pulmonic valve regurgitation is not visualized by color flow Doppler. 10. Normal pulmonary artery systolic pressure. 11. The inferior vena cava is normal in size with <50% respiratory variability, suggesting right atrial pressure of 8 mmHg. (on vent) 12. The interatrial septum was not well visualized.  Patient Profile     Jaclyn Day is a 73 y.o. female with a hx of A.fib on Eliquis, Diastolic heart failure, Htn, HLd, IDDM, CKD 4 w/ hx of nephrectomy admit post-arrest, found to be in A.fib w/ RVR and worsening systolic heart failure  Assessment & Plan  1.  Post Arrest-Care MSSA bacteremia E.Coli  UTI Presented to [facility] after arrest - ROSC after 10 minutes, 1 dose of Epi. No record of presenting rhythm. Blood culture showing MSSA bacteremia. Wbc 36.2->10.3 this am. Echocardiogram x2 showing reduced EF (25-30%) compared to prior - C/w cefazolin - TEE requested per ID - Holding on Cath due to AKI. Received dialysis session yesterday - EP eval in future  2. Atrial Fibrillation Prior hx of A.fib converted to sinus after cardioversion. Currently in A.fib. CHADS-VASC score of 5 due to gender, age, chf, htn, dm. HR currently in 80-100s. With worsened HF, best to cardiovert soon. TEE requested per ID. Can plan for TEE + Cardioversion - C/w heparin gtt - C/w IVamiodarone - Monitor/replete K/Mag as appropriate - C/w telemetry - Schedule for TEE / Cardioversion  3. Acute systolic heart failure Prior hx of diastolic heart failure now with new acute systolic heart failure. Echo showing global hypokinesis. Cannot rule out ischemic event. Repeat Echo confirm reduced EF 25-30%. Likely new baseline. As stated above, need ischemic eval with cath but likely will worsen kidney disease. - Being diuresed via dialysis - Daily weights, I&Os - C/w telemetry  4. Acute on Chronic Kidney Disease stage 4 Hx of L nephrectomy Baseline creatinine 2.7. Admit creatinine 2.64 -> 3.82 -> 2.8. Currently receiving hemodialysis - Trend renal fx - Avoid nephrotoxic meds when able    For questions or updates, please contact [place] Please consult [email or web address] for contact info under Cardiology/STEMI.   Signed, Gilberto Better, MD PGY-2, [place] [facility] Pager: [phone] 08/08/2019, 7:05 AM

## 2019-08-08 NOTE — Progress Notes (Signed)
[facility] ROUNDING NOTE   Subjective:   This is a 73 year old lady with a history of an out-of-hospital cardiac arrest of unclear etiology CPR for 10 minutes epinephrine before return of spontaneous circulation.  She has a history of new onset atrial fibrillation in April 2707 and diastolic dysfunction.  She has a history of chronic kidney disease stage IV status post nephrectomy.  Her most recent echo showed an ejection fraction of 25 to 30%.  Appreciate assistance from cardiology Dr. Gilberto Better she has a baseline GFR of about 20 cc/min from April 2020.  She was initiated on dialysis 08/06/2019.  She has a right IJ dialysis cath.  Urine cultures were positive for E. coli 08/03/2019.  Blood pressure 120/61 pulse 97 temperature 98.9 O2 sats 99% FiO2 30%.  Urine output 450 cc 08/07/2019 Sodium 136 potassium 3.5 chloride 106 CO2 20 BUN 61 creatinine 2.9 calcium 8.0 phosphorus 2.6 magnesium 1.9 albumin 1.7 WBC 10.3 hemoglobin 8.3 platelets 137.  Amiodarone IV, atorvastatin 20 mg daily, Protonix 40 mg daily, Ancef 1 g every 24 hours  Objective:  Vital signs in last 24 hours:  Temp:  [98.6 F (37 C)-99.2 F (37.3 C)] 98.9 F (37.2 C) (10/27 0700) Pulse Rate:  [46-115] 97 (10/27 0845) Resp:  [0-40] 20 (10/27 0800) BP: (70-162)/(47-137) 120/61 (10/27 0845) SpO2:  [95 %-100 %] 99 % (10/27 0800) Arterial Line BP: (74-157)/(40-90) 141/76 (10/27 0800) FiO2 (%):  [30 %] 30 % (10/27 0800) Weight:  [80.9 kg-82.6 kg] 82.6 kg (10/27 0640)  Weight change: -0.2 kg Filed Weights   08/07/19 0341 08/08/19 0400 08/08/19 0640  Weight: 81.1 kg 80.9 kg 82.6 kg    Intake/Output: I/O last 3 completed shifts: In: 3320.6 [I.V.:1180.6; NG/GT:2040; IV Piggyback:100] Out: 750 [Urine:750]   Intake/Output this shift:  Total I/O In: 67.6 [I.V.:27.6; NG/GT:40] Out: -   General: Lethargic NAD HEENT: MMM [place] AT anicteric sclera Neck:  No JVD, no adenopathy CV:  Heart RRR  Lungs:  L/S CTA  bilaterally Abd:  abd SNT/ND with normal BS GU:  Bladder non-palpable Extremities: +1 bilateral LE edema. Skin:  No skin rash   Basic Metabolic Panel: Recent Labs  Lab 08/04/19 0415  08/05/19 0521 08/05/19 1526 08/05/19 2002 08/06/19 0203 08/06/19 0854 08/07/19 0327 08/08/19 0320  NA 138   < > 141 140 138  --  139 138 136  K 3.7   < > 3.8 3.6 3.5  --  3.2* 3.2* 3.5  CL 109   < > 111 109  --   --  109 104 106  CO2 15*   < > 16* 16*  --   --  18* 20* 20*  GLUCOSE 116*   < > 96 138*  --   --  105* 119* 123*  BUN 43*   < > 48* 51*  --   --  57* 43* 61*  CREATININE 2.79*   < > 3.55* 3.74*  --   --  3.82* 2.78* 2.90*  CALCIUM 8.0*   < > 7.9* 8.0*  --   --  7.8* 7.9* 8.0*  MG 2.2  --  1.9  --   --  2.3  --  2.1 1.9  PHOS 4.5  --  4.9*  --   --  4.1  --  2.6 2.6   < > = values in this interval not displayed.    Liver Function Tests: Recent Labs  Lab 08/03/19 1013 08/08/19 0320  AST 26  --  ALT 20  --   ALKPHOS 94  --   BILITOT 1.2  --   PROT 4.8*  --   ALBUMIN 2.3* 1.7*   Recent Labs  Lab 08/03/19 1013  LIPASE 24   No results for input(s): AMMONIA in the last 168 hours.  CBC: Recent Labs  Lab 08/03/19 1147  08/05/19 0521 08/05/19 2002 08/06/19 0203 08/06/19 1433 08/07/19 0327 08/08/19 0320  WBC 32.1*   < > 31.5*  --  20.6* 16.3* 12.5* 10.3  NEUTROABS 30.2*  --   --   --   --   --  10.5* 7.5  HGB 11.4*   < > 10.0* 9.2* 8.5* 8.5* 8.3* 8.3*  HCT 38.0   < > 32.7* 27.0* 27.8* 27.8* 26.5* 26.7*  MCV 98.2   < > 95.9  --  95.9 96.2 93.6 93.4  PLT 278   < > 227  --  158 148* 143* 137*   < > = values in this interval not displayed.    Cardiac Enzymes: No results for input(s): CKTOTAL, CKMB, CKMBINDEX, TROPONINI in the last 168 hours.  BNP: Invalid input(s): POCBNP  CBG: Recent Labs  Lab 08/07/19 1714 08/07/19 2006 08/08/19 0027 08/08/19 0324 08/08/19 0731  GLUCAP 114* 130* 123* 117* 101*    Microbiology: Results for orders placed or performed  during the hospital encounter of 08/03/19  Blood culture (routine x 2)     Status: None   Collection Time: 08/03/19 10:33 AM   Specimen: BLOOD RIGHT HAND  Result Value Ref Range Status   Specimen Description BLOOD RIGHT HAND  Final   Special Requests   Final    BOTTLES DRAWN AEROBIC ONLY Blood Culture results may not be optimal due to an inadequate volume of blood received in culture bottles   Culture   Final    NO GROWTH 5 DAYS Performed at [facility], [place] [address] [place], [place] [address]    Report Status 08/08/2019 FINAL  Final  Blood culture (routine x 2)     Status: Abnormal   Collection Time: 08/03/19 10:48 AM   Specimen: BLOOD  Result Value Ref Range Status   Specimen Description BLOOD LEFT FEMORAL ARTERY  Final   Special Requests   Final    BOTTLES DRAWN AEROBIC AND ANAEROBIC Blood Culture adequate volume   Culture  Setup Time   Final    GRAM POSITIVE COCCI IN CLUSTERS AEROBIC BOTTLE ONLY CRITICAL RESULT CALLED TO, READ BACK BY AND VERIFIED WITH: J. LEDFORD,PHARMD 0207 08/04/2019 Mena Goes Performed at [facility], [place] [address], [place], [place] [address]    Culture STAPHYLOCOCCUS AUREUS (A)  Final   Report Status 08/06/2019 FINAL  Final   Organism ID, Bacteria STAPHYLOCOCCUS AUREUS  Final      Susceptibility   Staphylococcus aureus - MIC*    CIPROFLOXACIN <=0.5 SENSITIVE Sensitive     ERYTHROMYCIN <=0.25 SENSITIVE Sensitive     GENTAMICIN <=0.5 SENSITIVE Sensitive     OXACILLIN <=0.25 SENSITIVE Sensitive     TETRACYCLINE <=1 SENSITIVE Sensitive     VANCOMYCIN <=0.5 SENSITIVE Sensitive     TRIMETH/SULFA <=10 SENSITIVE Sensitive     CLINDAMYCIN <=0.25 SENSITIVE Sensitive     RIFAMPIN <=0.5 SENSITIVE Sensitive     Inducible Clindamycin NEGATIVE Sensitive     * STAPHYLOCOCCUS AUREUS  SARS Coronavirus 2 by RT PCR (hospital order, performed in [place] hospital lab) Nasopharyngeal Nasopharyngeal Swab     Status: None  Collection Time: 08/03/19  11:35 AM   Specimen: Nasopharyngeal Swab  Result Value Ref Range Status   SARS Coronavirus 2 NEGATIVE NEGATIVE Final    Comment: (NOTE) If result is NEGATIVE SARS-CoV-2 target nucleic acids are NOT DETECTED. The SARS-CoV-2 RNA is generally detectable in upper and lower  respiratory specimens during the acute phase of infection. The lowest  concentration of SARS-CoV-2 viral copies this assay can detect is 250  copies / mL. A negative result does not preclude SARS-CoV-2 infection  and should not be used as the sole basis for treatment or other  patient management decisions.  A negative result may occur with  improper specimen collection / handling, submission of specimen other  than nasopharyngeal swab, presence of viral mutation(s) within the  areas targeted by this assay, and inadequate number of viral copies  (<250 copies / mL). A negative result must be combined with clinical  observations, patient history, and epidemiological information. If result is POSITIVE SARS-CoV-2 target nucleic acids are DETECTED. The SARS-CoV-2 RNA is generally detectable in upper and lower  respiratory specimens dur ing the acute phase of infection.  Positive  results are indicative of active infection with SARS-CoV-2.  Clinical  correlation with patient history and other diagnostic information is  necessary to determine patient infection status.  Positive results do  not rule out bacterial infection or co-infection with other viruses. If result is PRESUMPTIVE POSTIVE SARS-CoV-2 nucleic acids MAY BE PRESENT.   A presumptive positive result was obtained on the submitted specimen  and confirmed on repeat testing.  While 2019 novel coronavirus  (SARS-CoV-2) nucleic acids may be present in the submitted sample  additional confirmatory testing may be necessary for epidemiological  and / or clinical management purposes  to differentiate between  SARS-CoV-2 and other Sarbecovirus currently known to infect  humans.  If clinically indicated additional testing with an alternate test  methodology [phone]) is advised. The SARS-CoV-2 RNA is generally  detectable in upper and lower respiratory sp ecimens during the acute  phase of infection. The expected result is Negative. Fact Sheet for Patients:  StrictlyIdeas.no Fact Sheet for Healthcare Providers: BankingDealers.co.za This test is not yet approved or cleared by the [place] FDA and has been authorized for detection and/or diagnosis of SARS-CoV-2 by FDA under an Emergency Use Authorization (EUA).  This EUA will remain in effect (meaning this test can be used) for the duration of the COVID-19 declaration under Section 564(b)(1) of the Act, 21 U.S.C. section 360bbb-3(b)(1), unless the authorization is terminated or revoked sooner. Performed at [facility], [place] [address] [place], [place] [address]   Culture, Urine     Status: Abnormal   Collection Time: 08/03/19  1:13 PM   Specimen: Urine, Random  Result Value Ref Range Status   Specimen Description URINE, RANDOM  Final   Special Requests   Final    NONE Performed at [facility], [place] [address], [place], [place] [address]    Culture >=100,000 COLONIES/mL ESCHERICHIA COLI (A)  Final   Report Status 08/05/2019 FINAL  Final   Organism ID, Bacteria ESCHERICHIA COLI (A)  Final      Susceptibility   Escherichia coli - MIC*    AMPICILLIN >=32 RESISTANT Resistant     CEFAZOLIN 8 SENSITIVE Sensitive     CEFTRIAXONE <=1 SENSITIVE Sensitive     CIPROFLOXACIN >=4 RESISTANT Resistant     GENTAMICIN >=16 RESISTANT Resistant     IMIPENEM <=0.25 SENSITIVE Sensitive  NITROFURANTOIN <=16 SENSITIVE Sensitive     TRIMETH/SULFA >=320 RESISTANT Resistant     AMPICILLIN/SULBACTAM 16 INTERMEDIATE Intermediate     PIP/TAZO <=4 SENSITIVE Sensitive     Extended ESBL NEGATIVE Sensitive     * >=100,000 COLONIES/mL ESCHERICHIA COLI  MRSA  PCR Screening     Status: None   Collection Time: 08/03/19  2:46 PM   Specimen: Nasopharyngeal  Result Value Ref Range Status   MRSA by PCR NEGATIVE NEGATIVE Final    Comment:        The GeneXpert MRSA Assay (FDA approved for NASAL specimens only), is one component of a comprehensive MRSA colonization surveillance program. It is not intended to diagnose MRSA infection nor to guide or monitor treatment for MRSA infections. Performed at [facility], [place] [address], [place], [place] [address]   Respiratory Panel by PCR     Status: None   Collection Time: 08/03/19  3:56 PM   Specimen: Nasopharyngeal Swab; Respiratory  Result Value Ref Range Status   Adenovirus NOT DETECTED NOT DETECTED Final   Coronavirus 229E NOT DETECTED NOT DETECTED Final    Comment: (NOTE) The Coronavirus on the Respiratory Panel, DOES NOT test for the novel  Coronavirus (2019 nCoV)    Coronavirus HKU1 NOT DETECTED NOT DETECTED Final   Coronavirus NL63 NOT DETECTED NOT DETECTED Final   Coronavirus OC43 NOT DETECTED NOT DETECTED Final   Metapneumovirus NOT DETECTED NOT DETECTED Final   Rhinovirus / Enterovirus NOT DETECTED NOT DETECTED Final   Influenza A NOT DETECTED NOT DETECTED Final   Influenza B NOT DETECTED NOT DETECTED Final   Parainfluenza Virus 1 NOT DETECTED NOT DETECTED Final   Parainfluenza Virus 2 NOT DETECTED NOT DETECTED Final   Parainfluenza Virus 3 NOT DETECTED NOT DETECTED Final   Parainfluenza Virus 4 NOT DETECTED NOT DETECTED Final   Respiratory Syncytial Virus NOT DETECTED NOT DETECTED Final   Bordetella pertussis NOT DETECTED NOT DETECTED Final   Chlamydophila pneumoniae NOT DETECTED NOT DETECTED Final   Mycoplasma pneumoniae NOT DETECTED NOT DETECTED Final    Comment: Performed at [facility], [place]. [address], [place], [place] [address]  Culture, blood (routine x 2)     Status: None (Preliminary result)   Collection Time: 08/07/19  6:29 PM   Specimen: BLOOD RIGHT ARM   Result Value Ref Range Status   Specimen Description BLOOD RIGHT ARM  Final   Special Requests   Final    BOTTLES DRAWN AEROBIC ONLY Blood Culture results may not be optimal due to an inadequate volume of blood received in culture bottles   Culture   Final    NO GROWTH < 24 HOURS Performed at [facility], [place] [address], [place], [place] [address]    Report Status PENDING  Incomplete  Culture, blood (routine x 2)     Status: None (Preliminary result)   Collection Time: 08/07/19  6:34 PM   Specimen: BLOOD RIGHT ARM  Result Value Ref Range Status   Specimen Description BLOOD RIGHT ARM  Final   Special Requests   Final    BOTTLES DRAWN AEROBIC ONLY Blood Culture results may not be optimal due to an inadequate volume of blood received in culture bottles   Culture   Final    NO GROWTH < 24 HOURS Performed at [facility], [place] [address], [place],  [address]    Report Status PENDING  Incomplete    Coagulation Studies: No results for  input(s): LABPROT, INR in the last 72 hours.  Urinalysis: Recent Labs    08/05/19 2018  [place] UNAVAILABLE DUE TO INTERFERING SUBSTANCE  PHURINE RESULTS UNAVAILABLE DUE TO INTERFERING SUBSTANCE  GLUCOSEU RESULTS UNAVAILABLE DUE TO INTERFERING SUBSTANCE*  HGBUR RESULTS UNAVAILABLE DUE TO INTERFERING SUBSTANCE*  BILIRUBINUR RESULTS UNAVAILABLE DUE TO INTERFERING SUBSTANCE*  KETONESUR RESULTS UNAVAILABLE DUE TO INTERFERING SUBSTANCE*  PROTEINUR RESULTS UNAVAILABLE DUE TO INTERFERING SUBSTANCE*  NITRITE RESULTS UNAVAILABLE DUE TO INTERFERING SUBSTANCE*  LEUKOCYTESUR RESULTS UNAVAILABLE DUE TO INTERFERING SUBSTANCE*      Imaging: Dg Chest Port 1 View  Result Date: 08/07/2019 CLINICAL DATA:  Endotracheal tube placement, respiratory failure. EXAM: PORTABLE CHEST 1 VIEW COMPARISON:  08/06/2019 FINDINGS: Endotracheal tube has tip 2.4 cm above the carina. PH probe noted of the proximal to mid esophagus.  Enteric tube courses into the region of the stomach and off the film as tip is not visualized. Right IJ central venous catheter unchanged. Lungs are adequately inflated with continued hazy opacification over the right perihilar region and lung bases. Likely small amount left pleural fluid. Mild stable cardiomegaly. Remainder of the exam is unchanged. IMPRESSION: Persistent hazy density over the right perihilar region and lung bases suggesting interstitial edema versus infection. Small left effusion. Mild stable cardiomegaly. Tubes and lines as described. Electronically Signed   By: Marin Olp M.D.   On: 08/07/2019 07:20   Dg Chest Port 1 View  Result Date: 08/06/2019 CLINICAL DATA:  Central line placement EXAM: PORTABLE CHEST 1 VIEW COMPARISON:  Chest radiograph from earlier today. FINDINGS: Endotracheal tube tip is 1.9 cm above the carina. Enteric tube enters stomach with the tip not seen on this image. Esophageal temperature probe terminates over the lower third of the thoracic esophagus. Right internal jugular central venous catheter terminates over the cavoatrial junction. Stable cardiomediastinal silhouette with mild cardiomegaly. No pneumothorax. Probable small stable right pleural effusion. No left pleural effusion. Mild hazy right parahilar opacity is stable. Stable patchy left lung base opacity. IMPRESSION: 1. No pneumothorax status post right internal jugular central venous catheter placement. 2. Endotracheal tube tip 1.9 cm above the carina. 3. Stable mild cardiomegaly. Stable mild hazy right parahilar opacity, favor mild pulmonary edema and/or atelectasis. 4. Stable small right pleural effusion. 5. Stable patchy left lung base opacity, favor atelectasis. Electronically Signed   By: Ilona Sorrel M.D.   On: 08/06/2019 13:55   Dg Abd Portable 1v  Result Date: 08/07/2019 CLINICAL DATA:  OG tube placement EXAM: PORTABLE ABDOMEN - 1 VIEW COMPARISON:  None. FINDINGS: Enteric tube terminates in the  region of the pylorus. Cholecystectomy clips. Mild blunting of the left costophrenic angle. IMPRESSION: Enteric tube terminates in the region of the pylorus. Electronically Signed   By: Julian Hy M.D.   On: 08/07/2019 10:31     Medications:   . sodium chloride 10 mL/hr at 08/07/19 0322  . sodium chloride 20 mL/hr at 08/07/19 0300  . sodium chloride    . sodium chloride    . sodium chloride    . amiodarone 30 mg/hr (08/08/19 0800)  .  ceFAZolin (ANCEF) IV Stopped (08/07/19 2012)  . heparin 1,100 Units/hr (08/08/19 0800)   . atorvastatin  20 mg Per NG tube Daily  . chlorhexidine gluconate (MEDLINE KIT)  15 mL Mouth Rinse BID  . Chlorhexidine Gluconate Cloth  6 each Topical Daily  . feeding supplement (PRO-STAT SUGAR FREE 64)  30 mL Per Tube TID  . feeding supplement (VITAL HIGH PROTEIN)  1,000 mL Per Tube Q24H  . insulin aspart  0-9 Units Subcutaneous Q4H  . mouth rinse  15 mL Mouth Rinse 10 times per day  . pantoprazole sodium  40 mg Per Tube QHS  . sodium chloride flush  10-40 mL Intracatheter Q12H  . sodium chloride flush  3 mL Intravenous Q12H   sodium chloride, sodium chloride, alteplase, fentaNYL (SUBLIMAZE) injection, heparin, sodium chloride flush, sodium chloride flush  Assessment/ Plan:  1. Chronic kidney disease stage IV with a baseline GFR around 19-20. Chronic kidney disease likely on the basis of longstanding hypertension in the setting of a solitary kidney.  2. Acute kidney injury. Renal ultrasound with no evidence of obstruction. She is status post left nephrectomy. Likely ATN in the setting of shock, cardiac arrest, and E. coli sepsis. Minimal urine output. We will plan for dialysis 08/08/2019  3. Status post cardiac arrest. Hypothermia protocol finishing.  4.Non-anion gap metabolic acidosis. Likely secondary to acute kidney injury.   Improved with dialysis  5. Hypokalemia.   Continue to supplement as needed.     LOS: [place] '@TODAY' '@9' :06 AM

## 2019-08-08 NOTE — Progress Notes (Signed)
eLink Physician-Brief Progress Note Patient Name: Jaclyn Day DOB: 09/22/1946 MRN: [id number]   Date of Service  08/08/2019  HPI/Events of Note  Notified of BP 164/71 HR 78  eICU Interventions  Ordered hydralazine 10 mg q 6 prn per OG for SBP > 170     Intervention Category Major Interventions: Hypertension - evaluation and management  Judd Lien 08/08/2019, 8:48 PM

## 2019-08-09 ENCOUNTER — Inpatient Hospital Stay (HOSPITAL_COMMUNITY): Payer: 59

## 2019-08-09 DIAGNOSIS — R7881 Bacteremia: Secondary | ICD-10-CM | POA: Diagnosis not present

## 2019-08-09 DIAGNOSIS — B9561 Methicillin susceptible Staphylococcus aureus infection as the cause of diseases classified elsewhere: Secondary | ICD-10-CM | POA: Diagnosis not present

## 2019-08-09 LAB — CBC WITH DIFFERENTIAL/PLATELET
Abs Immature Granulocytes: 0.37 10*3/uL — ABNORMAL HIGH (ref 0.00–0.07)
Basophils Absolute: 0 10*3/uL (ref 0.0–0.1)
Basophils Relative: 0 %
Eosinophils Absolute: 0 10*3/uL (ref 0.0–0.5)
Eosinophils Relative: 0 %
HCT: 25.6 % — ABNORMAL LOW (ref 36.0–46.0)
Hemoglobin: 8 g/dL — ABNORMAL LOW (ref 12.0–15.0)
Immature Granulocytes: 3 %
Lymphocytes Relative: 8 %
Lymphs Abs: 1.2 10*3/uL (ref 0.7–4.0)
MCH: 28.8 pg (ref 26.0–34.0)
MCHC: 31.3 g/dL (ref 30.0–36.0)
MCV: 92.1 fL (ref 80.0–100.0)
Monocytes Absolute: 1.8 10*3/uL — ABNORMAL HIGH (ref 0.1–1.0)
Monocytes Relative: 13 %
Neutro Abs: 10.4 10*3/uL — ABNORMAL HIGH (ref 1.7–7.7)
Neutrophils Relative %: 76 %
Platelets: 140 10*3/uL — ABNORMAL LOW (ref 150–400)
RBC: 2.78 MIL/uL — ABNORMAL LOW (ref 3.87–5.11)
RDW: 17 % — ABNORMAL HIGH (ref 11.5–15.5)
WBC: 13.8 10*3/uL — ABNORMAL HIGH (ref 4.0–10.5)
nRBC: 0 % (ref 0.0–0.2)

## 2019-08-09 LAB — RENAL FUNCTION PANEL
Albumin: 1.7 g/dL — ABNORMAL LOW (ref 3.5–5.0)
Anion gap: 8 (ref 5–15)
BUN: 34 mg/dL — ABNORMAL HIGH (ref 8–23)
CO2: 25 mmol/L (ref 22–32)
Calcium: 8 mg/dL — ABNORMAL LOW (ref 8.9–10.3)
Chloride: 103 mmol/L (ref 98–111)
Creatinine, Ser: 1.94 mg/dL — ABNORMAL HIGH (ref 0.44–1.00)
GFR calc Af Amer: 29 mL/min — ABNORMAL LOW (ref >60)
GFR calc non Af Amer: 25 mL/min — ABNORMAL LOW (ref >60)
Glucose, Bld: 110 mg/dL — ABNORMAL HIGH (ref 70–99)
Phosphorus: 2 mg/dL — ABNORMAL LOW (ref 2.5–4.6)
Potassium: 3.7 mmol/L (ref 3.5–5.1)
Sodium: 136 mmol/L (ref 135–145)

## 2019-08-09 LAB — GLUCOSE, CAPILLARY
Glucose-Capillary: 103 mg/dL — ABNORMAL HIGH (ref 70–99)
Glucose-Capillary: 103 mg/dL — ABNORMAL HIGH (ref 70–99)
Glucose-Capillary: 97 mg/dL (ref 70–99)
Glucose-Capillary: 94 mg/dL (ref 70–99)
Glucose-Capillary: 119 mg/dL — ABNORMAL HIGH (ref 70–99)

## 2019-08-09 LAB — TRIGLYCERIDES: Triglycerides: 64 mg/dL (ref <150)

## 2019-08-09 LAB — MAGNESIUM: Magnesium: 1.7 mg/dL (ref 1.7–2.4)

## 2019-08-09 LAB — HEPARIN LEVEL (UNFRACTIONATED): Heparin Unfractionated: 0.35 IU/mL (ref 0.30–0.70)

## 2019-08-09 MED ORDER — FENTANYL CITRATE (PF) 100 MCG/2ML IJ SOLN
25.0000 ug | Freq: Once | INTRAMUSCULAR | Status: AC | PRN
  Administered 2019-08-09: 50 ug via INTRAVENOUS

## 2019-08-09 MED ORDER — VITAL AF 1.2 CAL PO LIQD
1000.0000 mL | ORAL | Status: DC
  Administered 2019-08-09 – 2019-08-13 (×5): 1000 mL

## 2019-08-09 MED ORDER — MAGNESIUM SULFATE IN D5W 1-5 GM/100ML-% IV SOLN
1.0000 g | Freq: Once | INTRAVENOUS | Status: AC
  Administered 2019-08-09: 1 g via INTRAVENOUS
  Filled 2019-08-09: qty 100

## 2019-08-09 MED ORDER — SODIUM PHOSPHATES 45 MMOLE/15ML IV SOLN
20.0000 mmol | Freq: Once | INTRAVENOUS | Status: AC
  Administered 2019-08-09: 20 mmol via INTRAVENOUS
  Filled 2019-08-09: qty 6.67

## 2019-08-09 MED ORDER — ATORVASTATIN CALCIUM 10 MG PO TABS
20.0000 mg | ORAL_TABLET | Freq: Every day | ORAL | Status: DC
  Administered 2019-08-09 – 2019-09-05 (×26): 20 mg via NASOGASTRIC
  Filled 2019-08-09 (×26): qty 2

## 2019-08-09 MED ORDER — PROPOFOL 1000 MG/100ML IV EMUL
5.0000 ug/kg/min | INTRAVENOUS | Status: DC
  Administered 2019-08-09: 5 ug/kg/min via INTRAVENOUS
  Filled 2019-08-09: qty 100

## 2019-08-09 MED ORDER — ETOMIDATE 2 MG/ML IV SOLN
INTRAVENOUS | Status: AC
  Administered 2019-08-09: 20 mg via INTRAVENOUS
  Filled 2019-08-09: qty 10

## 2019-08-09 MED ORDER — ETOMIDATE 2 MG/ML IV SOLN
20.0000 mg | Freq: Once | INTRAVENOUS | Status: AC
  Administered 2019-08-09: 09:00:00 20 mg via INTRAVENOUS

## 2019-08-09 NOTE — Progress Notes (Signed)
[place] for Heparin Indication: atrial fibrillation  Allergies  Allergen Reactions  . Codeine Other (See Comments)    Reaction not recalled by family- was told to never take this    Patient Measurements: Height: _0  (167.6 cm) Weight: 177 lb 7.5 oz (80.5 kg) IBW/kg (Calculated) : 59.3 Heparin Dosing Weight: 78 kg  Vital Signs: Temp: 99.3 F (37.4 C) (10/28 0725) Temp Source: Oral (10/28 0725) BP: 155/89 (10/28 0700) Pulse Rate: 82 (10/28 0700)  Labs: Recent Labs    08/07/19 0327 08/08/19 0320 08/08/19 1423 08/09/19 0327  HGB 8.3* 8.3*  --  8.0*  HCT 26.5* 26.7*  --  25.6*  PLT 143* 137*  --  140*  APTT 69*  --   --   --   HEPARINUNFRC 0.43 0.25* 0.33 0.35  CREATININE 2.78* 2.90*  --  1.94*    Estimated Creatinine Clearance: 28.1 mL/min (A) (by C-G formula based on SCr of 1.94 mg/dL (H)).   Medical History: Past Medical History:  Diagnosis Date  . Anemia   . Chronic diastolic CHF (congestive heart failure) ([place])   . CKD (chronic kidney disease), stage IV ([place])   . Hypertension   . Persistent atrial fibrillation ([place])    a. dx 01/2019 s/p TEE DCCV.  [place] Renal disorder   . Unilateral congenital absence of kidney     Medications:  Scheduled:  . atorvastatin  20 mg Per NG tube Daily  . chlorhexidine gluconate (MEDLINE KIT)  15 mL Mouth Rinse BID  . Chlorhexidine Gluconate Cloth  6 each Topical Daily  . feeding supplement (PRO-STAT SUGAR FREE 64)  30 mL Per Tube TID  . feeding supplement (VITAL HIGH PROTEIN)  1,000 mL Per Tube Q24H  . insulin aspart  0-9 Units Subcutaneous Q4H  . mouth rinse  15 mL Mouth Rinse 10 times per day  . pantoprazole sodium  40 mg Per Tube QHS  . sodium chloride flush  10-40 mL Intracatheter Q12H  . sodium chloride flush  3 mL Intravenous Q12H   Infusions:  . sodium chloride 10 mL/hr at 08/08/19 2137  . sodium chloride 20 mL/hr at 08/07/19 0300  . sodium chloride    . sodium chloride     . sodium chloride    . amiodarone 30 mg/hr (08/09/19 0600)  .  ceFAZolin (ANCEF) IV Stopped (08/08/19 1913)  . heparin 1,100 Units/hr (08/09/19 0600)    Assessment: Pt is a 73 y/o female admitted s/p out of hospital cardiac arrest. ROSC was achieved and targeted temperature management was initiated. Pt has a history of A-fib (taking eliquis PTA), diastolic heart failure, HTN, HDL, IDDM, CKD. Pharmacy has been consulted to dose heparin for A-fib.  Baseline aPTT is 23 and heparin level is 1.22. Patient was on Eliquis PTA but last dose is unknown.   Heparin level this AM therapeutic at 0.35, on 1100 units/hr. Hgb 8.0, plt 140. No s/sx of bleeding. No infusion issues.   Goal of Therapy:  Heparin level 0.3-0.7 units/ml aPTT 66-102 seconds Monitor platelets by anticoagulation protocol: Yes   Plan:  Continue heparin at 1100 units/hr Daily heparin level, CBC Will need to watch cbc closely given trends   Vallery Sa, PharmD Candidate 08/09/2019 7:29 AM

## 2019-08-09 NOTE — Progress Notes (Signed)
[facility] for Infectious Disease  Date of Admission:  08/03/2019      Total days of antibiotics 7  Cefazolin day 4           ASSESSMENT: Jaclyn Day is a 73 y.o. female female with cardiac arrest outside the hospital. Found to have MSSA bacteremia and significantly depressed LVEF (20-25%) in the setting of AFib RVR following arrest. Leukocytosis has resolved and she has been afebrile on cefazolin. She is currently undergoing intermittent HD sessions. TEE at bedside today is suspicious for vegetation (small filamentous strand on the left coronary cusp). EF has recovered back to 45-50% and now back in sinus rhythm. Will treat for 6 weeks native valve MSSA endocarditis.  BCx 10/26 remain no growth.    PLAN: 1. Continue Cefazolin  2. Dose pending renal recovery/iHD tx. Continue current daily dosing for now.     Principal Problem:   MSSA bacteremia Active Problems:   Cardiac arrest ([facility])   Pressure injury of skin   Acute respiratory failure with hypoxia (HCC)   AKI (acute kidney injury) (HCC)   Elevated troponin   Persistent atrial fibrillation (HCC)   Acute on chronic combined systolic and diastolic CHF (congestive heart failure) ([place])   . atorvastatin  20 mg Per NG tube Daily  . chlorhexidine gluconate (MEDLINE KIT)  15 mL Mouth Rinse BID  . Chlorhexidine Gluconate Cloth  6 each Topical Daily  . feeding supplement (PRO-STAT SUGAR FREE 64)  30 mL Per Tube TID  . feeding supplement (VITAL HIGH PROTEIN)  1,000 mL Per Tube Q24H  . insulin aspart  0-9 Units Subcutaneous Q4H  . mouth rinse  15 mL Mouth Rinse 10 times per day  . pantoprazole sodium  40 mg Per Tube QHS  . sodium chloride flush  10-40 mL Intracatheter Q12H  . sodium chloride flush  3 mL Intravenous Q12H    SUBJECTIVE: Weaning on ventilator. Sleepy after just had TEE.   Interval Addition -  Afebrile o/n. WBC 13.8K. Blood cultures from 10/26 no growth.  Converted back to NSR.   Review  of Systems: Review of Systems  Unable to perform ROS: Intubated    Allergies  Allergen Reactions  . Codeine Other (See Comments)    Reaction not recalled by family- was told to never take this    OBJECTIVE: Vitals:   08/09/19 1030 08/09/19 1100 08/09/19 1122 08/09/19 1130  BP: 110/63 119/67  (!) 159/81  Pulse: 62 67 72 75  Resp: 20 20 (!) 24 (!) 48  Temp:      TempSrc:      SpO2: 99% 98% 100% 97%  Weight:      Height:       Body mass index is 28.64 kg/m.  Physical Exam Constitutional:      Comments: Resting in bed breathing comfortably on vent. Moves all extrem x 4   HENT:     Mouth/Throat:     Mouth: Mucous membranes are moist.     Pharynx: Oropharynx is clear.  Eyes:     General: No scleral icterus.    Pupils: Pupils are equal, round, and reactive to light.  Cardiovascular:     Rate and Rhythm: Tachycardia present. Rhythm irregular.     Heart sounds: No murmur.     Comments: NSR with PACs Pulmonary:     Effort: Pulmonary effort is normal.     Breath sounds: Normal breath sounds. No rhonchi.  Abdominal:  General: Bowel sounds are normal. There is no distension.  Skin:    General: Skin is warm and dry.     Capillary Refill: Capillary refill takes less than 2 seconds.  Neurological:     Mental Status: She is alert and oriented to person, place, and time.     Lab Results Lab Results  Component Value Date   WBC 13.8 (H) 08/09/2019   HGB 8.0 (L) 08/09/2019   HCT 25.6 (L) 08/09/2019   MCV 92.1 08/09/2019   PLT 140 (L) 08/09/2019    Lab Results  Component Value Date   CREATININE 1.94 (H) 08/09/2019   BUN 34 (H) 08/09/2019   NA 136 08/09/2019   K 3.7 08/09/2019   CL 103 08/09/2019   CO2 25 08/09/2019    Lab Results  Component Value Date   ALT 20 08/03/2019   AST 26 08/03/2019   ALKPHOS 94 08/03/2019   BILITOT 1.2 08/03/2019     Microbiology: Recent Results (from the past 240 hour(s))  Blood culture (routine x 2)     Status: None    Collection Time: 08/03/19 10:33 AM   Specimen: BLOOD RIGHT HAND  Result Value Ref Range Status   Specimen Description BLOOD RIGHT HAND  Final   Special Requests   Final    BOTTLES DRAWN AEROBIC ONLY Blood Culture results may not be optimal due to an inadequate volume of blood received in culture bottles   Culture   Final    NO GROWTH 5 DAYS Performed at [facility], [place] [address], [place], [place] [address]    Report Status 08/08/2019 FINAL  Final  Blood culture (routine x 2)     Status: Abnormal   Collection Time: 08/03/19 10:48 AM   Specimen: BLOOD  Result Value Ref Range Status   Specimen Description BLOOD LEFT FEMORAL ARTERY  Final   Special Requests   Final    BOTTLES DRAWN AEROBIC AND ANAEROBIC Blood Culture adequate volume   Culture  Setup Time   Final    GRAM POSITIVE COCCI IN CLUSTERS AEROBIC BOTTLE ONLY CRITICAL RESULT CALLED TO, READ BACK BY AND VERIFIED WITH: J. LEDFORD,PHARMD 0207 08/04/2019 Mena Goes Performed at [facility], [place] [address], [place], [place] [address]    Culture STAPHYLOCOCCUS AUREUS (A)  Final   Report Status 08/06/2019 FINAL  Final   Organism ID, Bacteria STAPHYLOCOCCUS AUREUS  Final      Susceptibility   Staphylococcus aureus - MIC*    CIPROFLOXACIN <=0.5 SENSITIVE Sensitive     ERYTHROMYCIN <=0.25 SENSITIVE Sensitive     GENTAMICIN <=0.5 SENSITIVE Sensitive     OXACILLIN <=0.25 SENSITIVE Sensitive     TETRACYCLINE <=1 SENSITIVE Sensitive     VANCOMYCIN <=0.5 SENSITIVE Sensitive     TRIMETH/SULFA <=10 SENSITIVE Sensitive     CLINDAMYCIN <=0.25 SENSITIVE Sensitive     RIFAMPIN <=0.5 SENSITIVE Sensitive     Inducible Clindamycin NEGATIVE Sensitive     * STAPHYLOCOCCUS AUREUS  SARS Coronavirus 2 by RT PCR (hospital order, performed in [place] hospital lab) Nasopharyngeal Nasopharyngeal Swab     Status: None   Collection Time: 08/03/19 11:35 AM   Specimen: Nasopharyngeal Swab  Result Value Ref Range Status   SARS Coronavirus  2 NEGATIVE NEGATIVE Final    Comment: (NOTE) If result is NEGATIVE SARS-CoV-2 target nucleic acids are NOT DETECTED. The SARS-CoV-2 RNA is generally detectable in upper and lower  respiratory specimens during the acute phase of infection. The  lowest  concentration of SARS-CoV-2 viral copies this assay can detect is 250  copies / mL. A negative result does not preclude SARS-CoV-2 infection  and should not be used as the sole basis for treatment or other  patient management decisions.  A negative result may occur with  improper specimen collection / handling, submission of specimen other  than nasopharyngeal swab, presence of viral mutation(s) within the  areas targeted by this assay, and inadequate number of viral copies  (<250 copies / mL). A negative result must be combined with clinical  observations, patient history, and epidemiological information. If result is POSITIVE SARS-CoV-2 target nucleic acids are DETECTED. The SARS-CoV-2 RNA is generally detectable in upper and lower  respiratory specimens dur ing the acute phase of infection.  Positive  results are indicative of active infection with SARS-CoV-2.  Clinical  correlation with patient history and other diagnostic information is  necessary to determine patient infection status.  Positive results do  not rule out bacterial infection or co-infection with other viruses. If result is PRESUMPTIVE POSTIVE SARS-CoV-2 nucleic acids MAY BE PRESENT.   A presumptive positive result was obtained on the submitted specimen  and confirmed on repeat testing.  While 2019 novel coronavirus  (SARS-CoV-2) nucleic acids may be present in the submitted sample  additional confirmatory testing may be necessary for epidemiological  and / or clinical management purposes  to differentiate between  SARS-CoV-2 and other Sarbecovirus currently known to infect humans.  If clinically indicated additional testing with an alternate test  methodology  [phone]) is advised. The SARS-CoV-2 RNA is generally  detectable in upper and lower respiratory sp ecimens during the acute  phase of infection. The expected result is Negative. Fact Sheet for Patients:  StrictlyIdeas.no Fact Sheet for Healthcare Providers: BankingDealers.co.za This test is not yet approved or cleared by the [place] FDA and has been authorized for detection and/or diagnosis of SARS-CoV-2 by FDA under an Emergency Use Authorization (EUA).  This EUA will remain in effect (meaning this test can be used) for the duration of the COVID-19 declaration under Section 564(b)(1) of the Act, 21 U.S.C. section 360bbb-3(b)(1), unless the authorization is terminated or revoked sooner. Performed at [facility], [place] [address] [place], [place] [address]   Culture, Urine     Status: Abnormal   Collection Time: 08/03/19  1:13 PM   Specimen: Urine, Random  Result Value Ref Range Status   Specimen Description URINE, RANDOM  Final   Special Requests   Final    NONE Performed at [facility], [place] [address], [place], [place] [address]    Culture >=100,000 COLONIES/mL ESCHERICHIA COLI (A)  Final   Report Status 08/05/2019 FINAL  Final   Organism ID, Bacteria ESCHERICHIA COLI (A)  Final      Susceptibility   Escherichia coli - MIC*    AMPICILLIN >=32 RESISTANT Resistant     CEFAZOLIN 8 SENSITIVE Sensitive     CEFTRIAXONE <=1 SENSITIVE Sensitive     CIPROFLOXACIN >=4 RESISTANT Resistant     GENTAMICIN >=16 RESISTANT Resistant     IMIPENEM <=0.25 SENSITIVE Sensitive     NITROFURANTOIN <=16 SENSITIVE Sensitive     TRIMETH/SULFA >=320 RESISTANT Resistant     AMPICILLIN/SULBACTAM 16 INTERMEDIATE Intermediate     PIP/TAZO <=4 SENSITIVE Sensitive     Extended ESBL NEGATIVE Sensitive     * >=100,000 COLONIES/mL ESCHERICHIA COLI  MRSA PCR Screening     Status: None   Collection Time:  08/03/19  2:46 PM   Specimen:  Nasopharyngeal  Result Value Ref Range Status   MRSA by PCR NEGATIVE NEGATIVE Final    Comment:        The GeneXpert MRSA Assay (FDA approved for NASAL specimens only), is one component of a comprehensive MRSA colonization surveillance program. It is not intended to diagnose MRSA infection nor to guide or monitor treatment for MRSA infections. Performed at [facility], [place] [address] [place], [place] [address]   Respiratory Panel by PCR     Status: None   Collection Time: 08/03/19  3:56 PM   Specimen: Nasopharyngeal Swab; Respiratory  Result Value Ref Range Status   Adenovirus NOT DETECTED NOT DETECTED Final   Coronavirus 229E NOT DETECTED NOT DETECTED Final    Comment: (NOTE) The Coronavirus on the Respiratory Panel, DOES NOT test for the novel  Coronavirus (2019 nCoV)    Coronavirus HKU1 NOT DETECTED NOT DETECTED Final   Coronavirus NL63 NOT DETECTED NOT DETECTED Final   Coronavirus OC43 NOT DETECTED NOT DETECTED Final   Metapneumovirus NOT DETECTED NOT DETECTED Final   Rhinovirus / Enterovirus NOT DETECTED NOT DETECTED Final   Influenza A NOT DETECTED NOT DETECTED Final   Influenza B NOT DETECTED NOT DETECTED Final   Parainfluenza Virus 1 NOT DETECTED NOT DETECTED Final   Parainfluenza Virus 2 NOT DETECTED NOT DETECTED Final   Parainfluenza Virus 3 NOT DETECTED NOT DETECTED Final   Parainfluenza Virus 4 NOT DETECTED NOT DETECTED Final   Respiratory Syncytial Virus NOT DETECTED NOT DETECTED Final   Bordetella pertussis NOT DETECTED NOT DETECTED Final   Chlamydophila pneumoniae NOT DETECTED NOT DETECTED Final   Mycoplasma pneumoniae NOT DETECTED NOT DETECTED Final    Comment: Performed at [facility], [place] [address], [place], [place] [address]  Culture, blood (routine x 2)     Status: None (Preliminary result)   Collection Time: 08/07/19  6:29 PM   Specimen: BLOOD RIGHT ARM  Result Value Ref Range Status   Specimen Description BLOOD RIGHT ARM  Final    Special Requests   Final    BOTTLES DRAWN AEROBIC ONLY Blood Culture results may not be optimal due to an inadequate volume of blood received in culture bottles   Culture   Final    NO GROWTH < 24 HOURS Performed at [facility], [place] [address] [place], [place] [address]    Report Status PENDING  Incomplete  Culture, blood (routine x 2)     Status: None (Preliminary result)   Collection Time: 08/07/19  6:34 PM   Specimen: BLOOD RIGHT ARM  Result Value Ref Range Status   Specimen Description BLOOD RIGHT ARM  Final   Special Requests   Final    BOTTLES DRAWN AEROBIC ONLY Blood Culture results may not be optimal due to an inadequate volume of blood received in culture bottles   Culture   Final    NO GROWTH < 24 HOURS Performed at [facility], [place] [address] [place], [place] [address]    Report Status PENDING  Incomplete     Janene Madeira, MSN, NP-C [facility] Infectious Disease [email or web address] Pager: [phone] Office: [phone] [place]: [phone]

## 2019-08-09 NOTE — Progress Notes (Signed)
Nutrition Follow-up  DOCUMENTATION CODES:   Not applicable  INTERVENTION:   Once enteral access obtained, initiate TF: - Vital AF 1.2 @ 55 ml/hr (1320 ml/day)  Tube feeding regimen provides 1584 kcal, 99 grams of protein, and 1071 ml of H2O, meeting 100% of needs.  NUTRITION DIAGNOSIS:   Inadequate oral intake related to inability to eat as evidenced by NPO status.  Ongoing, being addressed via TF  GOAL:   Patient will meet greater than or equal to 90% of their needs  Met via TF  MONITOR:   Vent status, Labs, Weight trends, TF tolerance, Skin, I & O's  REASON FOR ASSESSMENT:   Ventilator, Consult Enteral/tube feeding initiation and management  ASSESSMENT:   73 year old female who presented to the ED on 10/22 after cardiac arrest. PMH anemia, CHF, CKD stage IV, HTN, IDDM. Pt required intubation in the ED. TTM initiated.  10/24 - rewarmed, TF initiated 10/25 - HD catheter placed, first HD 10/27 - HD 10/28 - s/p TEE suspicious for vegetation  Spoke with RN. OG tube removed for TEE. Per RN, pt did not wean well. Plan is to replace OG tube and restart TF.  Per Nephrology note today, holding on further dialysis and monitoring renal function over the next day or two.  Propofol used for TEE but weaning now.  Weight down 15 lbs since 10/22. Suspect weight fluctuations related to fluid status and HD. Pt no longer in obese BMI category. Will adjust estimated needs and TF regimen accordingly.  Patient is currently intubated on ventilator support MV: 11.1 L/min Temp (24hrs), Avg:99.1 F (37.3 C), Min:98.3 F (36.8 C), Max:99.6 F (37.6 C) BP (a-line): 114/43 MAP (a-line): 62  Drips: Amiodarone: 16.7 ml/hr Heparin: 11 ml/hr  Medications reviewed and include: SSI, Protonix, IV abx, sodium phosphate 20 mmol once  Labs reviewed: phosphorus 2.0, hemoglobin 8.0 CBG's: 97-125 x 24 hours  UOP: 400 ml x 24 hours HD net UF 10/27: 2507 ml I/O's: +4.8 L since  admit  Diet Order:   Diet Order            Diet NPO time specified  Diet effective midnight              EDUCATION NEEDS:   No education needs have been identified at this time  Skin:  Skin Assessment: Skin Integrity Issues: Skin Integrity Issues: DTI: sacrum  Last BM:  08/08/19  Height:   Ht Readings from Last 1 Encounters:  08/03/19 _0  (1.676 m)    Weight:   Wt Readings from Last 1 Encounters:  08/09/19 80.5 kg    Ideal Body Weight:  59.1 kg  BMI:  Body mass index is 28.64 kg/m.  Estimated Nutritional Needs:   Kcal:  0149  Protein:  95-115 grams  Fluid:  >/= 1.5 L    Gaynell Face, MS, RD, LDN Inpatient Clinical Dietitian Pager: [phone] Weekend/After Hours: [phone]

## 2019-08-09 NOTE — Progress Notes (Signed)
 Progress Note  Patient Name: Jaclyn Day Date of Encounter: 08/09/2019  Primary Cardiologist: Katarina Nelson, MD   Subjective  O/N events: Converted spontaneously to sinus overnight  [name] was examined and evaluated at bedside this AM. She was observed to open her eyes to verbal stimuli and was able to follow directions. She nods no to questions regarding chest pain, palpitations.  Inpatient Medications    Scheduled Meds: . atorvastatin  20 mg Per NG tube Daily  . chlorhexidine gluconate (MEDLINE KIT)  15 mL Mouth Rinse BID  . Chlorhexidine Gluconate Cloth  6 each Topical Daily  . feeding supplement (PRO-STAT SUGAR FREE 64)  30 mL Per Tube TID  . feeding supplement (VITAL HIGH PROTEIN)  1,000 mL Per Tube Q24H  . insulin aspart  0-9 Units Subcutaneous Q4H  . mouth rinse  15 mL Mouth Rinse 10 times per day  . pantoprazole sodium  40 mg Per Tube QHS  . sodium chloride flush  10-40 mL Intracatheter Q12H  . sodium chloride flush  3 mL Intravenous Q12H   Continuous Infusions: . sodium chloride 10 mL/hr at 08/08/19 2137  . sodium chloride 20 mL/hr at 08/07/19 0300  . sodium chloride    . sodium chloride    . sodium chloride    . amiodarone 30 mg/hr (08/09/19 0600)  .  ceFAZolin (ANCEF) IV Stopped (08/08/19 1913)  . heparin 1,100 Units/hr (08/09/19 0600)   PRN Meds: sodium chloride, sodium chloride, alteplase, fentaNYL (SUBLIMAZE) injection, heparin, hydrALAZINE, sodium chloride flush, sodium chloride flush   Vital Signs    Vitals:   08/09/19 0430 08/09/19 0500 08/09/19 0600 08/09/19 0700  BP: (!) 155/67 136/84 134/63 (!) 155/89  Pulse: 81 80 78 82  Resp: (!) 1 17 10 12  Temp:      TempSrc:      SpO2: 99% 100% 98% 99%  Weight:  80.5 kg    Height:        Intake/Output Summary (Last 24 hours) at 08/09/2019 0714 Last data filed at 08/09/2019 0600 Gross per 24 hour  Intake 1122.34 ml  Output 2907 ml  Net -1784.66 ml   Filed Weights   08/08/19 0640  08/08/19 1048 08/09/19 0500  Weight: 82.6 kg 80 kg 80.5 kg    Telemetry   Converted to sinus around 11pm. Normal sinus with PVCs - Personally Reviewed  ECG    No new tracings to review - Personally Reviewed  Physical Exam   Gen: Well-developed, well nourished, NAD HEENT: NCAT head, hearing intact, ETtube in place Neck: supple, ROM intact, no JVD CV: RRR, S1, S2 normal, No rubs, no murmurs, no gallops Pulm: CTAB, No rales, no wheezes Abd: Soft, BS+, NTND, No rebound Extm: ROM intact, Peripheral pulses intact, 1+ pitting edema lower extremities Skin: Dry, Warm, normal turgor, no rashes, lesions, wounds.  Neuro: Somnolent  Labs    Chemistry Recent Labs  Lab 08/03/19 1013  08/07/19 0327 08/08/19 0320 08/09/19 0327  NA 142   < > 138 136 136  K 3.7   < > 3.2* 3.5 3.7  CL 111   < > 104 106 103  CO2 18*   < > 20* 20* 25  GLUCOSE 145*   < > 119* 123* 110*  BUN 37*   < > 43* 61* 34*  CREATININE 2.64*   < > 2.78* 2.90* 1.94*  CALCIUM 8.2*   < > 7.9* 8.0* 8.0*  PROT 4.8*  --   --   --   --     ALBUMIN 2.3*  --   --  1.7* 1.7*  AST 26  --   --   --   --   ALT 20  --   --   --   --   ALKPHOS 94  --   --   --   --   BILITOT 1.2  --   --   --   --   GFRNONAA 17*   < > 16* 16* 25*  GFRAA 20*   < > 19* 18* 29*  ANIONGAP 13   < > 14 10 8   < > = values in this interval not displayed.     Hematology Recent Labs  Lab 08/07/19 0327 08/08/19 0320 08/09/19 0327  WBC 12.5* 10.3 13.8*  RBC 2.83* 2.86* 2.78*  HGB 8.3* 8.3* 8.0*  HCT 26.5* 26.7* 25.6*  MCV 93.6 93.4 92.1  MCH 29.3 29.0 28.8  MCHC 31.3 31.1 31.3  RDW 17.0* 16.9* 17.0*  PLT 143* 137* 140*    Cardiac EnzymesNo results for input(s): TROPONINI in the last 168 hours. No results for input(s): TROPIPOC in the last 168 hours.   BNPNo results for input(s): BNP, PROBNP in the last 168 hours.   DDimer  Recent Labs  Lab 08/03/19 1239  DDIMER 11.22*     Radiology    Dg Abd Portable 1v  Result Date: 08/07/2019  CLINICAL DATA:  OG tube placement EXAM: PORTABLE ABDOMEN - 1 VIEW COMPARISON:  None. FINDINGS: Enteric tube terminates in the region of the pylorus. Cholecystectomy clips. Mild blunting of the left costophrenic angle. IMPRESSION: Enteric tube terminates in the region of the pylorus. Electronically Signed   By: Sriyesh  Krishnan M.D.   On: 08/07/2019 10:31    Cardiac Studies   TTE 08/03/19  1. Left ventricular ejection fraction, by visual estimation, is 20 to 25%. The left ventricle has severely decreased function. Normal left ventricular size. There is moderately increased left ventricular hypertrophy. Severe global hypokinesis.  2. Left ventricular diastolic function could not be evaluated pattern of LV diastolic filling.  3. Global right ventricle has mildly reduced systolic function.The right ventricular size is normal. No increase in right ventricular wall thickness.  4. Left atrial size was severely dilated.  5. Right atrial size was normal.  6. The mitral valve is abnormal. Mild mitral valve regurgitation.  7. The tricuspid valve is grossly normal. Tricuspid valve regurgitation is trivial.  8. The aortic valve is tricuspid Aortic valve regurgitation was not visualized by color flow Doppler.  9. The pulmonic valve was grossly normal. Pulmonic valve regurgitation is not visualized by color flow Doppler. 10. Normal pulmonary artery systolic pressure. 11. The inferior vena cava is normal in size with <50% respiratory variability, suggesting right atrial pressure of 8 mmHg. (on vent) 12. The interatrial septum was not well visualized.  Patient Profile     Jaclyn Day is a 73 y.o. female with a hx of A.fib on Eliquis, Diastolic heart failure, Htn, HLd, IDDM, CKD 4 w/ hx of nephrectomy admit post-arrest, found to be in A.fib w/ RVR and worsening systolic heart failure  Assessment & Plan  1.  Post Arrest-Care MSSA bacteremia E.Coli UTI Presented to [facility] after arrest - ROSC after 10  minutes, 1 dose of Epi. No record of presenting rhythm. Blood culture showing MSSA bacteremia. Wbc 36.2->10.3 this am. Echocardiogram x2 showing reduced EF (25-30%) compared to prior - C/w cefazolin - Plan for bedside TEE today - Holding on Cath due to   AKI. Received dialysis session yesterday - EP eval in future  2. Atrial Fibrillation Prior hx of A.fib converted to sinus after cardioversion. Currently in A.fib. CHADS-VASC score of 5 due to gender, age, chf, htn, dm. Spontaneous conversion to sinus overnight - C/w heparin gtt - C/w IVamiodarone - Monitor/replete K/Mag as appropriate - C/w telemetry  3. Acute systolic heart failure Prior hx of diastolic heart failure now with new acute systolic heart failure. Echo showing global hypokinesis. Cannot rule out ischemic event. Repeat Echo confirm reduced EF 25-30%. Likely new baseline. As stated above, need ischemic eval with cath but likely will worsen kidney disease. 3L out yesterday. Appears more euvolemic - C/w dialysis per nephro - Daily weights, I&Os - C/w telemetry  4. Acute on Chronic Kidney Disease stage 4 Hx of L nephrectomy Baseline creatinine 2.7. Admit creatinine 2.64 -> 3.82 -> 2.8->1.94. Currently receiving hemodialysis - Trend renal fx - Avoid nephrotoxic meds when able    For questions or updates, please contact [place] Please consult [email or web address] for contact info under Cardiology/STEMI.   Signed, Gilberto Better, MD PGY-2, [facility] Pager: [phone] 08/08/2019, 7:05 AM

## 2019-08-09 NOTE — CV Procedure (Signed)
TRANSESOPHAGEAL ECHOCARDIOGRAM (TEE) NOTE  INDICATIONS: infective endocarditis  PROCEDURE:   Informed consent was obtained prior to the procedure. The risks, benefits and alternatives for the procedure were discussed and the patient comprehended these risks.  Risks include, but are not limited to, cough, sore throat, vomiting, nausea, somnolence, esophageal and stomach trauma or perforation, bleeding, low blood pressure, aspiration, pneumonia, infection, trauma to the teeth and death.    After a procedural time-out, the patient was given propofol and etomidate by PCCM (Dr. Vaughan Browner) for sedation.  The patient's heart rate, blood pressure, and oxygen saturation are monitored continuously during the procedure. The patient was intubated on the ventilator in the ICU with the PCCM physician, ICU nurse, sonographer, resident physician and myself present.  The transesophageal probe was inserted in the esophagus and stomach without difficulty and multiple views were obtained.  She remained stable throughout the procedure.  Agitated microbubble saline contrast was not administered.  COMPLICATIONS:    There were no immediate complications.  Findings:  1. LEFT VENTRICLE: The left ventricular wall thickness is moderately increased.  The left ventricular cavity is normal in size. Wall motion is normal.  LVEF is 55-60%. Possible mild anterior hypokinesis.  2. RIGHT VENTRICLE:  The right ventricle is normal in structure and function without any thrombus or masses.    3. LEFT ATRIUM:  The left atrium is severely dilated in size without any thrombus or masses.  There is not spontaneous echo contrast ("smoke") in the left atrium consistent with a low flow state.  4. LEFT ATRIAL APPENDAGE:  The large, chickenwing appearing left atrial appendage is free of any thrombus or masses. The appendage has single lobes. Pulse doppler indicates moderate flow in the appendage.  5. ATRIAL SEPTUM:  The atrial septum  appears intact and is free of thrombus and/or masses.  There is no evidence for interatrial shunting by color doppler and saline microbubble.  6. RIGHT ATRIUM:  The right atrium is normal in size and function without any thrombus or masses.  7. MITRAL VALVE:  The mitral valve is normal in structure and function with Mild regurgitation.  There were no vegetations or stenosis.  8. AORTIC VALVE:  The aortic valve is trileaflet. There appears to be a small, filamentous structure seen oscillating on the aortic side of the LCC, suggestive of vegetation. Lambl's excrescence (which are normal small mobile frons are typically on several leaflets, so this is less likely the cause), with trivial regurgitation.  There was no stenosis  9. TRICUSPID VALVE:  The tricuspid valve is normal in structure and function with Mild regurgitation.  There were no vegetations or stenosis  10.  PULMONIC VALVE:  The pulmonic valve is normal in structure and function with no regurgitation.  There were no vegetations or stenosis.   11. AORTIC ARCH, ASCENDING AND DESCENDING AORTA:  There was grade 1 Ron Parker et. Al, 1992) atherosclerosis of the ascending aorta, aortic arch, or proximal descending aorta.  12. PULMONARY VEINS: Anomalous pulmonary venous return was not noted.  13. PERICARDIUM: The pericardium appeared normal and non-thickened.  There is no pericardial effusion. A left pleural effusion is noted.  IMPRESSION:   1. Small filamentous mass on the aortic side of the left coronary cusp, most consistent with vegetation given the clinical picture of staph bacteremia. 2. No LAA thrombus 3. Negative for PFO by color doppler 4. LVEF improved to 55-60% - possible mild anterior hypokinesis.  RECOMMENDATIONS:    1. Treatment for MSSA endocarditis as per  ID recs 2. Patient spontaneously converted to sinus rhythm before the procedure, however, would continue IV amiodarone until extubated and taking po's 3. Reassuring  improvement in LVEF, suggests stunned myocardium that has recovered  Time Spent Directly with the Patient:  45 minutes   Jaclyn Casino, MD, [facility], [place] Director of the Advanced Lipid Disorders &  Cardiovascular Risk Reduction Clinic Diplomate of the American Board of Clinical Lipidology Attending Cardiologist  Direct Dial: [phone]  Fax: [phone]  Website:  [email or web address] 08/09/2019, 9:47 AM

## 2019-08-09 NOTE — Progress Notes (Addendum)
Patient had continuous high peak pressures. After multiple suctions we suctioned a small red plug from patient. Peak pressures returned to the 20s and patient got high tidal volumes. BBS clear; diminished. Vitals stable.

## 2019-08-09 NOTE — Procedures (Signed)
PCCM Note  Requested to provide conscious sedation for TEE procedure by cardiology service Patient stable on the ventilator with minimal vent settings Propofol gtt initiated and titrated to 70 mcg/kg/min, 20 mg IV etomidate x1  Heart rate, blood pressure, sats continuously monitored  Patient tolerated procedure well.  Duration of sedation 25 minutes  Marshell Garfinkel MD  Pulmonary and Critical Care 08/09/2019, 9:37 AM

## 2019-08-09 NOTE — Progress Notes (Signed)
  Echocardiogram Echocardiogram Transesophageal has been performed.  Jaclyn Day 08/09/2019, 9:49 AM

## 2019-08-09 NOTE — Progress Notes (Signed)
[facility] ROUNDING NOTE   Subjective:   This is a 73 year old lady with a history of an out-of-hospital cardiac arrest of unclear etiology CPR for 10 minutes epinephrine before return of spontaneous circulation.  She has a history of new onset atrial fibrillation in April 7867 and diastolic dysfunction.  She has a history of chronic kidney disease stage IV status post nephrectomy.  Her most recent echo showed an ejection fraction of 25 to 30%.  Appreciate assistance from cardiology Dr. Gilberto Better she has a baseline GFR of about 20 cc/min from April 2020.  She was initiated on dialysis 08/06/2019.  She has a right IJ dialysis cath.  Urine cultures were positive for E. coli 08/03/2019.  Blood pressure 145/85 pulse 71 temperature 9.3 O2 sats 99% FiO2 30% on ventilator  Sodium 136 potassium 3.7 chloride 103 CO2 25 BUN 34 creatinine 1.94 glucose 110 calcium 8.0 phosphorus 2.0 magnesium 1.7 albumin 1.7.  Amiodarone IV, atorvastatin 20 mg daily, Protonix 40 mg daily, Ancef 1 g every 24 hours IV heparin  Objective:  Vital signs in last 24 hours:  Temp:  [97.6 F (36.4 C)-99.6 F (37.6 C)] 99.3 F (37.4 C) (10/28 0725) Pulse Rate:  [67-118] 72 (10/28 0726) Resp:  [0-30] 20 (10/28 0726) BP: (83-178)/(53-120) 155/89 (10/28 0700) SpO2:  [97 %-100 %] 98 % (10/28 0726) Arterial Line BP: (96-199)/(43-91) 153/65 (10/28 0700) FiO2 (%):  [30 %-40 %] 30 % (10/28 0726) Weight:  [80 kg-80.5 kg] 80.5 kg (10/28 0500)  Weight change: -0.9 kg Filed Weights   08/08/19 0640 08/08/19 1048 08/09/19 0500  Weight: 82.6 kg 80 kg 80.5 kg    Intake/Output: I/O last 3 completed shifts: In: 1997.7 [I.V.:977.7; NG/GT:920; IV Piggyback:100] Out: 6720 [Urine:900; Other:2507]   Intake/Output this shift:  No intake/output data recorded.  General: Lethargic NAD HEENT: MMM [place] AT anicteric sclera Neck:  No JVD, no adenopathy CV:  Heart RRR  Lungs:  L/S CTA bilaterally Abd:  abd SNT/ND with normal  BS GU:  Bladder non-palpable Extremities: +1 bilateral LE edema. Skin:  No skin rash   Basic Metabolic Panel: Recent Labs  Lab 08/05/19 0521 08/05/19 1526 08/05/19 2002 08/06/19 0203 08/06/19 0854 08/07/19 0327 08/08/19 0320 08/09/19 0327  NA 141 140 138  --  139 138 136 136  K 3.8 3.6 3.5  --  3.2* 3.2* 3.5 3.7  CL 111 109  --   --  109 104 106 103  CO2 16* 16*  --   --  18* 20* 20* 25  GLUCOSE 96 138*  --   --  105* 119* 123* 110*  BUN 48* 51*  --   --  57* 43* 61* 34*  CREATININE 3.55* 3.74*  --   --  3.82* 2.78* 2.90* 1.94*  CALCIUM 7.9* 8.0*  --   --  7.8* 7.9* 8.0* 8.0*  MG 1.9  --   --  2.3  --  2.1 1.9 1.7  PHOS 4.9*  --   --  4.1  --  2.6 2.6 2.0*    Liver Function Tests: Recent Labs  Lab 08/03/19 1013 08/08/19 0320 08/09/19 0327  AST 26  --   --   ALT 20  --   --   ALKPHOS 94  --   --   BILITOT 1.2  --   --   PROT 4.8*  --   --   ALBUMIN 2.3* 1.7* 1.7*   Recent Labs  Lab 08/03/19 1013  LIPASE 24  No results for input(s): AMMONIA in the last 168 hours.  CBC: Recent Labs  Lab 08/03/19 1147  08/06/19 0203 08/06/19 1433 08/07/19 0327 08/08/19 0320 08/09/19 0327  WBC 32.1*   < > 20.6* 16.3* 12.5* 10.3 13.8*  NEUTROABS 30.2*  --   --   --  10.5* 7.5 10.4*  HGB 11.4*   < > 8.5* 8.5* 8.3* 8.3* 8.0*  HCT 38.0   < > 27.8* 27.8* 26.5* 26.7* 25.6*  MCV 98.2   < > 95.9 96.2 93.6 93.4 92.1  PLT 278   < > 158 148* 143* 137* 140*   < > = values in this interval not displayed.    Cardiac Enzymes: No results for input(s): CKTOTAL, CKMB, CKMBINDEX, TROPONINI in the last 168 hours.  BNP: Invalid input(s): POCBNP  CBG: Recent Labs  Lab 08/08/19 1605 08/08/19 1938 08/08/19 2348 08/09/19 0332 08/09/19 0722  GLUCAP 125* 125* 116* 103* 103*    Microbiology: Results for orders placed or performed during the hospital encounter of 08/03/19  Blood culture (routine x 2)     Status: None   Collection Time: 08/03/19 10:33 AM   Specimen: BLOOD RIGHT  HAND  Result Value Ref Range Status   Specimen Description BLOOD RIGHT HAND  Final   Special Requests   Final    BOTTLES DRAWN AEROBIC ONLY Blood Culture results may not be optimal due to an inadequate volume of blood received in culture bottles   Culture   Final    NO GROWTH 5 DAYS Performed at [facility], [place] [address], [place], [place] [address]    Report Status 08/08/2019 FINAL  Final  Blood culture (routine x 2)     Status: Abnormal   Collection Time: 08/03/19 10:48 AM   Specimen: BLOOD  Result Value Ref Range Status   Specimen Description BLOOD LEFT FEMORAL ARTERY  Final   Special Requests   Final    BOTTLES DRAWN AEROBIC AND ANAEROBIC Blood Culture adequate volume   Culture  Setup Time   Final    GRAM POSITIVE COCCI IN CLUSTERS AEROBIC BOTTLE ONLY CRITICAL RESULT CALLED TO, READ BACK BY AND VERIFIED WITH: J. LEDFORD,PHARMD 0207 08/04/2019 Mena Goes Performed at [facility], [place] [address] [place], [place] [address]    Culture STAPHYLOCOCCUS AUREUS (A)  Final   Report Status 08/06/2019 FINAL  Final   Organism ID, Bacteria STAPHYLOCOCCUS AUREUS  Final      Susceptibility   Staphylococcus aureus - MIC*    CIPROFLOXACIN <=0.5 SENSITIVE Sensitive     ERYTHROMYCIN <=0.25 SENSITIVE Sensitive     GENTAMICIN <=0.5 SENSITIVE Sensitive     OXACILLIN <=0.25 SENSITIVE Sensitive     TETRACYCLINE <=1 SENSITIVE Sensitive     VANCOMYCIN <=0.5 SENSITIVE Sensitive     TRIMETH/SULFA <=10 SENSITIVE Sensitive     CLINDAMYCIN <=0.25 SENSITIVE Sensitive     RIFAMPIN <=0.5 SENSITIVE Sensitive     Inducible Clindamycin NEGATIVE Sensitive     * STAPHYLOCOCCUS AUREUS  SARS Coronavirus 2 by RT PCR (hospital order, performed in [place] hospital lab) Nasopharyngeal Nasopharyngeal Swab     Status: None   Collection Time: 08/03/19 11:35 AM   Specimen: Nasopharyngeal Swab  Result Value Ref Range Status   SARS Coronavirus 2 NEGATIVE NEGATIVE Final    Comment: (NOTE) If result is  NEGATIVE SARS-CoV-2 target nucleic acids are NOT DETECTED. The SARS-CoV-2 RNA is generally detectable in upper and lower  respiratory specimens during the acute phase  of infection. The lowest  concentration of SARS-CoV-2 viral copies this assay can detect is 250  copies / mL. A negative result does not preclude SARS-CoV-2 infection  and should not be used as the sole basis for treatment or other  patient management decisions.  A negative result may occur with  improper specimen collection / handling, submission of specimen other  than nasopharyngeal swab, presence of viral mutation(s) within the  areas targeted by this assay, and inadequate number of viral copies  (<250 copies / mL). A negative result must be combined with clinical  observations, patient history, and epidemiological information. If result is POSITIVE SARS-CoV-2 target nucleic acids are DETECTED. The SARS-CoV-2 RNA is generally detectable in upper and lower  respiratory specimens dur ing the acute phase of infection.  Positive  results are indicative of active infection with SARS-CoV-2.  Clinical  correlation with patient history and other diagnostic information is  necessary to determine patient infection status.  Positive results do  not rule out bacterial infection or co-infection with other viruses. If result is PRESUMPTIVE POSTIVE SARS-CoV-2 nucleic acids MAY BE PRESENT.   A presumptive positive result was obtained on the submitted specimen  and confirmed on repeat testing.  While 2019 novel coronavirus  (SARS-CoV-2) nucleic acids may be present in the submitted sample  additional confirmatory testing may be necessary for epidemiological  and / or clinical management purposes  to differentiate between  SARS-CoV-2 and other Sarbecovirus currently known to infect humans.  If clinically indicated additional testing with an alternate test  methodology [id number]) is advised. The SARS-CoV-2 RNA is generally  detectable  in upper and lower respiratory sp ecimens during the acute  phase of infection. The expected result is Negative. Fact Sheet for Patients:  StrictlyIdeas.no Fact Sheet for Healthcare Providers: BankingDealers.co.za This test is not yet approved or cleared by the [place] FDA and has been authorized for detection and/or diagnosis of SARS-CoV-2 by FDA under an Emergency Use Authorization (EUA).  This EUA will remain in effect (meaning this test can be used) for the duration of the COVID-19 declaration under Section 564(b)(1) of the Act, 21 U.S.C. section 360bbb-3(b)(1), unless the authorization is terminated or revoked sooner. Performed at [facility], [place] [address], [place], [place] [address]   Culture, Urine     Status: Abnormal   Collection Time: 08/03/19  1:13 PM   Specimen: Urine, Random  Result Value Ref Range Status   Specimen Description URINE, RANDOM  Final   Special Requests   Final    NONE Performed at [facility], [place] [address], [place], [place] [address]    Culture >=100,000 COLONIES/mL ESCHERICHIA COLI (A)  Final   Report Status 08/05/2019 FINAL  Final   Organism ID, Bacteria ESCHERICHIA COLI (A)  Final      Susceptibility   Escherichia coli - MIC*    AMPICILLIN >=32 RESISTANT Resistant     CEFAZOLIN 8 SENSITIVE Sensitive     CEFTRIAXONE <=1 SENSITIVE Sensitive     CIPROFLOXACIN >=4 RESISTANT Resistant     GENTAMICIN >=16 RESISTANT Resistant     IMIPENEM <=0.25 SENSITIVE Sensitive     NITROFURANTOIN <=16 SENSITIVE Sensitive     TRIMETH/SULFA >=320 RESISTANT Resistant     AMPICILLIN/SULBACTAM 16 INTERMEDIATE Intermediate     PIP/TAZO <=4 SENSITIVE Sensitive     Extended ESBL NEGATIVE Sensitive     * >=100,000 COLONIES/mL ESCHERICHIA COLI  MRSA PCR Screening     Status: None  Collection Time: 08/03/19  2:46 PM   Specimen: Nasopharyngeal  Result Value Ref Range Status   MRSA by PCR NEGATIVE  NEGATIVE Final    Comment:        The GeneXpert MRSA Assay (FDA approved for NASAL specimens only), is one component of a comprehensive MRSA colonization surveillance program. It is not intended to diagnose MRSA infection nor to guide or monitor treatment for MRSA infections. Performed at [facility], [place] [address], [place], [place] [address]   Respiratory Panel by PCR     Status: None   Collection Time: 08/03/19  3:56 PM   Specimen: Nasopharyngeal Swab; Respiratory  Result Value Ref Range Status   Adenovirus NOT DETECTED NOT DETECTED Final   Coronavirus 229E NOT DETECTED NOT DETECTED Final    Comment: (NOTE) The Coronavirus on the Respiratory Panel, DOES NOT test for the novel  Coronavirus (2019 nCoV)    Coronavirus HKU1 NOT DETECTED NOT DETECTED Final   Coronavirus NL63 NOT DETECTED NOT DETECTED Final   Coronavirus OC43 NOT DETECTED NOT DETECTED Final   Metapneumovirus NOT DETECTED NOT DETECTED Final   Rhinovirus / Enterovirus NOT DETECTED NOT DETECTED Final   Influenza A NOT DETECTED NOT DETECTED Final   Influenza B NOT DETECTED NOT DETECTED Final   Parainfluenza Virus 1 NOT DETECTED NOT DETECTED Final   Parainfluenza Virus 2 NOT DETECTED NOT DETECTED Final   Parainfluenza Virus 3 NOT DETECTED NOT DETECTED Final   Parainfluenza Virus 4 NOT DETECTED NOT DETECTED Final   Respiratory Syncytial Virus NOT DETECTED NOT DETECTED Final   Bordetella pertussis NOT DETECTED NOT DETECTED Final   Chlamydophila pneumoniae NOT DETECTED NOT DETECTED Final   Mycoplasma pneumoniae NOT DETECTED NOT DETECTED Final    Comment: Performed at [facility], [place]. [address] [place], [place] [address]  Culture, blood (routine x 2)     Status: None (Preliminary result)   Collection Time: 08/07/19  6:29 PM   Specimen: BLOOD RIGHT ARM  Result Value Ref Range Status   Specimen Description BLOOD RIGHT ARM  Final   Special Requests   Final    BOTTLES DRAWN AEROBIC ONLY Blood Culture  results may not be optimal due to an inadequate volume of blood received in culture bottles   Culture   Final    NO GROWTH < 24 HOURS Performed at [facility], [place] [address], [place], [place] [address]    Report Status PENDING  Incomplete  Culture, blood (routine x 2)     Status: None (Preliminary result)   Collection Time: 08/07/19  6:34 PM   Specimen: BLOOD RIGHT ARM  Result Value Ref Range Status   Specimen Description BLOOD RIGHT ARM  Final   Special Requests   Final    BOTTLES DRAWN AEROBIC ONLY Blood Culture results may not be optimal due to an inadequate volume of blood received in culture bottles   Culture   Final    NO GROWTH < 24 HOURS Performed at [facility], [place] [address] [place], [place] [address]    Report Status PENDING  Incomplete    Coagulation Studies: No results for input(s): LABPROT, INR in the last 72 hours.  Urinalysis: No results for input(s): COLORURINE, LABSPEC, PHURINE, GLUCOSEU, HGBUR, BILIRUBINUR, KETONESUR, PROTEINUR, UROBILINOGEN, NITRITE, LEUKOCYTESUR in the last 72 hours.  Invalid input(s): APPERANCEUR    Imaging: Dg Abd Portable 1v  Result Date: 08/07/2019 CLINICAL DATA:  OG tube placement EXAM: PORTABLE ABDOMEN - 1  VIEW COMPARISON:  None. FINDINGS: Enteric tube terminates in the region of the pylorus. Cholecystectomy clips. Mild blunting of the left costophrenic angle. IMPRESSION: Enteric tube terminates in the region of the pylorus. Electronically Signed   By: Julian Hy M.D.   On: 08/07/2019 10:31     Medications:   . sodium chloride 10 mL/hr at 08/08/19 2137  . sodium chloride 20 mL/hr at 08/07/19 0300  . sodium chloride    . sodium chloride    . sodium chloride    . amiodarone 30 mg/hr (08/09/19 0600)  .  ceFAZolin (ANCEF) IV Stopped (08/08/19 1913)  . heparin 1,100 Units/hr (08/09/19 0600)   . atorvastatin  20 mg Per NG tube Daily  . chlorhexidine gluconate (MEDLINE KIT)  15 mL Mouth Rinse BID  .  Chlorhexidine Gluconate Cloth  6 each Topical Daily  . feeding supplement (PRO-STAT SUGAR FREE 64)  30 mL Per Tube TID  . feeding supplement (VITAL HIGH PROTEIN)  1,000 mL Per Tube Q24H  . insulin aspart  0-9 Units Subcutaneous Q4H  . mouth rinse  15 mL Mouth Rinse 10 times per day  . pantoprazole sodium  40 mg Per Tube QHS  . sodium chloride flush  10-40 mL Intracatheter Q12H  . sodium chloride flush  3 mL Intravenous Q12H   sodium chloride, sodium chloride, alteplase, fentaNYL (SUBLIMAZE) injection, heparin, hydrALAZINE, sodium chloride flush, sodium chloride flush  Assessment/ Plan:  1. Chronic kidney disease stage IV with a baseline GFR around 19-20. Chronic kidney disease likely on the basis of longstanding hypertension in the setting of a solitary kidney.  2. Acute kidney injury. Renal ultrasound with no evidence of obstruction. She is status post left nephrectomy. Likely ATN in the setting of shock, cardiac arrest, and E. coli sepsis. Minimal urine output. Tolerated dialysis 08/08/2019 with 3 L removed.  Still making some urine 550 cc 08/08/2019.  We will hold on dialysis and see what her renal function does over the next day or two.  3. Status post cardiac arrest.  ROSC 10 minutes 1 dose epinephrine.  Ejection fraction 25 to 30%.  Holding on cardiac catheterization secondary to acute kidney injury.  Underwent bedside TEE 08/09/2019  4.Non-anion gap metabolic acidosis. Likely secondary to acute kidney injury.   Improved with dialysis  5. Hypokalemia.   Continue to supplement as needed.  6.  Atrial fibrillation continues on heparin and amiodarone monitoring electrolytes  7.  E. coli UTI/sepsis   continues Ancef 1 g every 24 hours  8. Hypophosphatemia  Will replete      LOS: [place] _0 _1 :30 AM
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
## 2019-08-10 ENCOUNTER — Inpatient Hospital Stay (HOSPITAL_COMMUNITY): Payer: 59

## 2019-08-10 DIAGNOSIS — I469 Cardiac arrest, cause unspecified: Secondary | ICD-10-CM | POA: Diagnosis not present

## 2019-08-10 DIAGNOSIS — I330 Acute and subacute infective endocarditis: Secondary | ICD-10-CM

## 2019-08-10 DIAGNOSIS — R7881 Bacteremia: Secondary | ICD-10-CM | POA: Diagnosis not present

## 2019-08-10 DIAGNOSIS — N179 Acute kidney failure, unspecified: Secondary | ICD-10-CM | POA: Diagnosis not present

## 2019-08-10 DIAGNOSIS — R778 Other specified abnormalities of plasma proteins: Secondary | ICD-10-CM | POA: Diagnosis not present

## 2019-08-10 DIAGNOSIS — I5043 Acute on chronic combined systolic (congestive) and diastolic (congestive) heart failure: Secondary | ICD-10-CM | POA: Diagnosis not present

## 2019-08-10 DIAGNOSIS — J9601 Acute respiratory failure with hypoxia: Secondary | ICD-10-CM | POA: Diagnosis not present

## 2019-08-10 LAB — CBC WITH DIFFERENTIAL/PLATELET
Abs Immature Granulocytes: 0.49 10*3/uL — ABNORMAL HIGH (ref 0.00–0.07)
Basophils Absolute: 0 10*3/uL (ref 0.0–0.1)
Basophils Relative: 0 %
Eosinophils Absolute: 0.1 10*3/uL (ref 0.0–0.5)
Eosinophils Relative: 0 %
HCT: 27.2 % — ABNORMAL LOW (ref 36.0–46.0)
Hemoglobin: 8.5 g/dL — ABNORMAL LOW (ref 12.0–15.0)
Immature Granulocytes: 3 %
Lymphocytes Relative: 10 %
Lymphs Abs: 1.8 10*3/uL (ref 0.7–4.0)
MCH: 28.9 pg (ref 26.0–34.0)
MCHC: 31.3 g/dL (ref 30.0–36.0)
MCV: 92.5 fL (ref 80.0–100.0)
Monocytes Absolute: 1.9 10*3/uL — ABNORMAL HIGH (ref 0.1–1.0)
Monocytes Relative: 10 %
Neutro Abs: 14.6 10*3/uL — ABNORMAL HIGH (ref 1.7–7.7)
Neutrophils Relative %: 77 %
Platelets: 182 10*3/uL (ref 150–400)
RBC: 2.94 MIL/uL — ABNORMAL LOW (ref 3.87–5.11)
RDW: 17.2 % — ABNORMAL HIGH (ref 11.5–15.5)
WBC: 18.9 10*3/uL — ABNORMAL HIGH (ref 4.0–10.5)
nRBC: 0 % (ref 0.0–0.2)

## 2019-08-10 LAB — HEPARIN LEVEL (UNFRACTIONATED)
Heparin Unfractionated: 0.23 IU/mL — ABNORMAL LOW (ref 0.30–0.70)
Heparin Unfractionated: 0.19 IU/mL — ABNORMAL LOW (ref 0.30–0.70)

## 2019-08-10 LAB — BASIC METABOLIC PANEL
Anion gap: 11 (ref 5–15)
BUN: 41 mg/dL — ABNORMAL HIGH (ref 8–23)
CO2: 24 mmol/L (ref 22–32)
Calcium: 8.1 mg/dL — ABNORMAL LOW (ref 8.9–10.3)
Chloride: 99 mmol/L (ref 98–111)
Creatinine, Ser: 2.37 mg/dL — ABNORMAL HIGH (ref 0.44–1.00)
GFR calc Af Amer: 23 mL/min — ABNORMAL LOW (ref >60)
GFR calc non Af Amer: 20 mL/min — ABNORMAL LOW (ref >60)
Glucose, Bld: 139 mg/dL — ABNORMAL HIGH (ref 70–99)
Potassium: 3.5 mmol/L (ref 3.5–5.1)
Sodium: 134 mmol/L — ABNORMAL LOW (ref 135–145)

## 2019-08-10 LAB — GLUCOSE, CAPILLARY
Glucose-Capillary: 108 mg/dL — ABNORMAL HIGH (ref 70–99)
Glucose-Capillary: 111 mg/dL — ABNORMAL HIGH (ref 70–99)
Glucose-Capillary: 113 mg/dL — ABNORMAL HIGH (ref 70–99)
Glucose-Capillary: 125 mg/dL — ABNORMAL HIGH (ref 70–99)
Glucose-Capillary: 127 mg/dL — ABNORMAL HIGH (ref 70–99)
Glucose-Capillary: 128 mg/dL — ABNORMAL HIGH (ref 70–99)
Glucose-Capillary: 129 mg/dL — ABNORMAL HIGH (ref 70–99)

## 2019-08-10 LAB — MAGNESIUM: Magnesium: 1.9 mg/dL (ref 1.7–2.4)

## 2019-08-10 LAB — PHOSPHORUS: Phosphorus: 3.2 mg/dL (ref 2.5–4.6)

## 2019-08-10 MED ORDER — AMIODARONE HCL 200 MG PO TABS: 200.0000 mg | ORAL_TABLET | Freq: Every day | ORAL | Status: DC

## 2019-08-10 MED ORDER — DEXMEDETOMIDINE HCL IN NACL 400 MCG/100ML IV SOLN: 0.4000 ug/kg/h | INTRAVENOUS | Status: DC

## 2019-08-10 MED ORDER — MAGNESIUM SULFATE 2 GM/50ML IV SOLN
2.0000 g | Freq: Once | INTRAVENOUS | Status: AC
  Administered 2019-08-10: 2 g via INTRAVENOUS
  Filled 2019-08-10: qty 50

## 2019-08-10 MED ORDER — FENTANYL CITRATE (PF) 100 MCG/2ML IJ SOLN
25.0000 ug | INTRAMUSCULAR | Status: DC | PRN
  Administered 2019-08-10: 50 ug via INTRAVENOUS
  Administered 2019-08-11 – 2019-08-15 (×5): 100 ug via INTRAVENOUS
  Filled 2019-08-10 (×6): qty 2

## 2019-08-10 MED ORDER — POTASSIUM CHLORIDE 20 MEQ/15ML (10%) PO SOLN
40.0000 meq | Freq: Once | ORAL | Status: AC
  Administered 2019-08-10: 40 meq via ORAL
  Filled 2019-08-10: qty 30

## 2019-08-10 MED ORDER — POTASSIUM CHLORIDE CRYS ER 20 MEQ PO TBCR: 40.0000 meq | EXTENDED_RELEASE_TABLET | Freq: Once | ORAL | Status: DC

## 2019-08-10 MED ORDER — FUROSEMIDE 10 MG/ML IJ SOLN
80.0000 mg | Freq: Three times a day (TID) | INTRAMUSCULAR | Status: AC
  Administered 2019-08-10 – 2019-08-11 (×3): 80 mg via INTRAVENOUS
  Filled 2019-08-10 (×3): qty 8

## 2019-08-10 MED ORDER — DEXMEDETOMIDINE HCL IN NACL 400 MCG/100ML IV SOLN
0.2000 ug/kg/h | INTRAVENOUS | Status: DC
  Administered 2019-08-10: 0.4 ug/kg/h via INTRAVENOUS
  Administered 2019-08-11: 0.3 ug/kg/h via INTRAVENOUS
  Administered 2019-08-11: 0.1 ug/kg/h via INTRAVENOUS
  Administered 2019-08-12: 0.5 ug/kg/h via INTRAVENOUS
  Administered 2019-08-12: 0.2 ug/kg/h via INTRAVENOUS
  Administered 2019-08-12: 01:00:00 0.4 ug/kg/h via INTRAVENOUS
  Filled 2019-08-10 (×6): qty 100

## 2019-08-10 MED ORDER — CEFAZOLIN SODIUM-DEXTROSE 1-4 GM/50ML-% IV SOLN
1.0000 g | Freq: Two times a day (BID) | INTRAVENOUS | Status: DC
  Administered 2019-08-10 – 2019-08-11 (×2): 1 g via INTRAVENOUS
  Filled 2019-08-10 (×2): qty 50

## 2019-08-10 MED ORDER — AMIODARONE HCL 200 MG PO TABS
200.0000 mg | ORAL_TABLET | Freq: Every day | ORAL | Status: DC
  Administered 2019-08-10 – 2019-08-16 (×6): 200 mg
  Filled 2019-08-10 (×6): qty 1

## 2019-08-10 NOTE — Progress Notes (Signed)
Progress Note  Patient Name: Jaclyn Day Date of Encounter: 08/10/2019  Primary Cardiologist: Ena Dawley, MD   Subjective  O/N events: Agitated overnight. Precedex restarted. Only 300cc urine overnight.  Jaclyn Day was examined and evaluated at bedside this AM. She is alert and able to follow directions but less interactive this morning. Denies any chest pain or dyspnea.   Inpatient Medications    Scheduled Meds: . atorvastatin  20 mg Per NG tube Daily  . chlorhexidine gluconate (MEDLINE KIT)  15 mL Mouth Rinse BID  . Chlorhexidine Gluconate Cloth  6 each Topical Daily  . insulin aspart  0-9 Units Subcutaneous Q4H  . mouth rinse  15 mL Mouth Rinse 10 times per day  . pantoprazole sodium  40 mg Per Tube QHS  . sodium chloride flush  10-40 mL Intracatheter Q12H  . sodium chloride flush  3 mL Intravenous Q12H   Continuous Infusions: . sodium chloride 10 mL/hr at 08/08/19 2137  . sodium chloride 20 mL/hr at 08/07/19 0300  . sodium chloride    . sodium chloride    . sodium chloride    . amiodarone 30 mg/hr (08/10/19 0700)  .  ceFAZolin (ANCEF) IV Stopped (08/09/19 1929)  . dexmedetomidine (PRECEDEX) IV infusion    . feeding supplement (VITAL AF 1.2 CAL) 1,000 mL (08/09/19 1651)  . heparin 1,250 Units/hr (08/10/19 0700)  . propofol (DIPRIVAN) infusion Stopped (08/09/19 1117)   PRN Meds: sodium chloride, sodium chloride, alteplase, fentaNYL (SUBLIMAZE) injection, heparin, hydrALAZINE, sodium chloride flush, sodium chloride flush   Vital Signs    Vitals:   08/10/19 0530 08/10/19 0600 08/10/19 0630 08/10/19 0700  BP: (!) 160/84 (!) 148/100 (!) 149/67 (!) 158/87  Pulse: 89 88 (!) 45 84  Resp: (!) 8 (!) 0 13 (!) 3  Temp:      TempSrc:      SpO2: 99% 99% 100% 99%  Weight:      Height:        Intake/Output Summary (Last 24 hours) at 08/10/2019 0713 Last data filed at 08/10/2019 0700 Gross per 24 hour  Intake 2066.38 ml  Output 300 ml  Net 1766.38 ml    Filed Weights   08/08/19 1048 08/09/19 0500 08/10/19 0414  Weight: 80 kg 80.5 kg 79.3 kg    Telemetry   Normal sinus with PVCs - Personally Reviewed  ECG    No new tracings to review - Personally Reviewed  Physical Exam   Gen: Well-developed, well nourished, NAD HEENT: NCAT head, hearing intact, ETtube in place Neck: supple, ROM intact, no JVD CV: RRR, S1, S2 normal, No rubs, no murmurs, no gallops Pulm: CTAB, No rales, no wheezes Abd: Soft, BS+, NTND, No rebound Extm: ROM intact, Peripheral pulses intact, no edema lower extremities Skin: Dry, Warm, normal turgor, no rashes, lesions, wounds.  Neuro: Somnolent  Labs    Chemistry Recent Labs  Lab 08/03/19 1013  08/08/19 0320 08/09/19 0327 08/10/19 0437  NA 142   < > 136 136 134*  K 3.7   < > 3.5 3.7 3.5  CL 111   < > 106 103 99  CO2 18*   < > 20* 25 24  GLUCOSE 145*   < > 123* 110* 139*  BUN 37*   < > 61* 34* 41*  CREATININE 2.64*   < > 2.90* 1.94* 2.37*  CALCIUM 8.2*   < > 8.0* 8.0* 8.1*  PROT 4.8*  --   --   --   --  ALBUMIN 2.3*  --  1.7* 1.7*  --   AST 26  --   --   --   --   ALT 20  --   --   --   --   ALKPHOS 94  --   --   --   --   BILITOT 1.2  --   --   --   --   GFRNONAA 17*   < > 16* 25* 20*  GFRAA 20*   < > 18* 29* 23*  ANIONGAP 13   < > 10 8 11   < > = values in this interval not displayed.     Hematology Recent Labs  Lab 08/08/19 0320 08/09/19 0327 08/10/19 0437  WBC 10.3 13.8* 18.9*  RBC 2.86* 2.78* 2.94*  HGB 8.3* 8.0* 8.5*  HCT 26.7* 25.6* 27.2*  MCV 93.4 92.1 92.5  MCH 29.0 28.8 28.9  MCHC 31.1 31.3 31.3  RDW 16.9* 17.0* 17.2*  PLT 137* 140* 182    Cardiac EnzymesNo results for input(s): TROPONINI in the last 168 hours. No results for input(s): TROPIPOC in the last 168 hours.   BNPNo results for input(s): BNP, PROBNP in the last 168 hours.   DDimer  Recent Labs  Lab 08/03/19 1239  DDIMER 11.22*     Radiology    Dg Chest Port 1 View  Result Date: 08/10/2019  CLINICAL DATA:  Endotracheal tube EXAM: PORTABLE CHEST 1 VIEW COMPARISON:  Yesterday FINDINGS: The endotracheal tube tip is at the carina, consider retraction by 3 cm. The orogastric tube reaches the stomach at least. Right IJ line with tip at the SVC. Hazy opacity at both lung bases. There is likely small volume pleural fluid. Large lung volumes. Chronic cardiomegaly. No pneumothorax. IMPRESSION: 1. The endotracheal tube tip is at the carina, suggest retraction by approximately 3 cm. 2. Unchanged airspace opacity at the bases with pleural fluid. Electronically Signed   By: Jonathon  Watts M.D.   On: 08/10/2019 06:33   Dg Chest Port 1 View  Result Date: 08/09/2019 CLINICAL DATA:  OG tube placement EXAM: PORTABLE CHEST 1 VIEW COMPARISON:  08/07/2019, 08/06/2019, 08/05/2019 FINDINGS: Endotracheal tube tip is less than a cm superior to the carina. Right IJ central venous catheter tip over the SVC. Esophageal tube tip below the diaphragm but non included. Small left-sided pleural effusion. No significant change in dense airspace disease at left lung base and hazy right lower lung airspace disease. Stable cardiomediastinal silhouette with mild vascular congestion. IMPRESSION: 1. Endotracheal tube tip less than a cm superior to the carina 2. Esophageal tube tip is below the diaphragm but incompletely visualized 3. Probable small pleural effusions. No significant change in cardiomegaly, vascular congestion and bibasilar airspace disease. Electronically Signed   By: Kim  Fujinaga M.D.   On: 08/09/2019 16:09    Cardiac Studies   TTE 08/03/19  1. Left ventricular ejection fraction, by visual estimation, is 20 to 25%. The left ventricle has severely decreased function. Normal left ventricular size. There is moderately increased left ventricular hypertrophy. Severe global hypokinesis.  2. Left ventricular diastolic function could not be evaluated pattern of LV diastolic filling.  3. Global right ventricle has  mildly reduced systolic function.The right ventricular size is normal. No increase in right ventricular wall thickness.  4. Left atrial size was severely dilated.  5. Right atrial size was normal.  6. The mitral valve is abnormal. Mild mitral valve regurgitation.  7. The tricuspid valve is grossly normal. Tricuspid valve   regurgitation is trivial.  8. The aortic valve is tricuspid Aortic valve regurgitation was not visualized by color flow Doppler.  9. The pulmonic valve was grossly normal. Pulmonic valve regurgitation is not visualized by color flow Doppler. 10. Normal pulmonary artery systolic pressure. 11. The inferior vena cava is normal in size with <50% respiratory variability, suggesting right atrial pressure of 8 mmHg. (on vent) 12. The interatrial septum was not well visualized.  Patient Profile     Jaclyn Day is a 73 y.o. female with a hx of A.fib on Eliquis, Diastolic heart failure, Htn, HLd, IDDM, CKD 4 w/ hx of nephrectomy admit post-arrest, found to be in A.fib w/ RVR and worsening systolic heart failure  Assessment & Plan  1.  Post Arrest-Care MSSA bacteremia E.Coli UTI Presented to [facility] after arrest. Blood culture showing MSSA bacteremia. Wbc 36.2->10.3->18.9 this am. Worsening leukocytosis but vitals stable. Echocardiogram x2 showing reduced EF (25-30%) compared to prior. Bedside TEE showing normal EF w/ possible aortic valve vegetation - C/w cefazolin - Holding on Cath due to AKI. Worsening w/ holding dialysis - EP eval in future  2. Atrial Fibrillation Prior hx of A.fib converted to sinus after cardioversion. CHADS-VASC score of 5 due to gender, age, chf, htn, dm. Spontaneous conversion to sinus 08/08/19 - C/w heparin gtt - C/w IVamiodarone, can transition to oral if able to be extubated - Monitor/replete K/Mag as appropriate - C/w telemetry  3. Acute systolic heart failure Prior hx of diastolic heart failure now with new acute systolic heart failure. Echo  showing global hypokinesis. Bedside TEE showing recovered EF. Need cath but waiting to see if kidney function will recover w/o dialysis - Awaiting renal recovery - Daily weights, I&Os - C/w telemetry  4. Acute on Chronic Kidney Disease stage 4 Hx of L nephrectomy Baseline creatinine 2.7. Admit creatinine 2.64 -> 3.82 -> 2.8->1.94->2.37. Dialysis held to see if native kidney will make urine. Only 300cc overnight.  - Trend renal fx - Avoid nephrotoxic meds when able    For questions or updates, please contact [facility] Please consult [email or web address] for contact info under Cardiology/STEMI.   Signed,  , MD PGY-2, [facility] Pager: [phone] 08/10/2019, 7:05 AM    
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
## 2019-08-10 NOTE — Progress Notes (Signed)
ANTICOAGULATION CONSULT NOTE - Follow Up Consult  Pharmacy Consult for heparin Indication: atrial fibrillation  Labs: Recent Labs    08/08/19 0320 08/08/19 1423 08/09/19 0327 08/10/19 0437  HGB 8.3*  --  8.0* 8.5*  HCT 26.7*  --  25.6* 27.2*  PLT 137*  --  140* 182  HEPARINUNFRC 0.25* 0.33 0.35 0.23*  CREATININE 2.90*  --  1.94*  --     Assessment: 73yo female subtherapeutic on heparin after two levels at lower end of goal; no gtt issues or signs of bleeding per RN.  Goal of Therapy:  Heparin level 0.3-0.7 units/ml   Plan:  Will increase heparin gtt by 2 units/kg/hr to 1250 units/hr and check level in 8 hours.    Wynona Neat, PharmD, BCPS  08/10/2019,5:36 AM

## 2019-08-10 NOTE — Progress Notes (Signed)
[place] for Heparin Indication: atrial fibrillation  Allergies  Allergen Reactions  . Codeine Other (See Comments)    Reaction not recalled by family- was told to never take this    Patient Measurements: Height: _0  (167.6 cm) Weight: 174 lb 13.2 oz (79.3 kg) IBW/kg (Calculated) : 59.3 Heparin Dosing Weight: 78 kg  Vital Signs: Temp: 98.7 F (37.1 C) (10/29 1130) Temp Source: Oral (10/29 1130) BP: 83/47 (10/29 1400) Pulse Rate: 59 (10/29 1400)  Labs: Recent Labs    08/08/19 0320  08/09/19 0327 08/10/19 0437 08/10/19 1311  HGB 8.3*  --  8.0* 8.5*  --   HCT 26.7*  --  25.6* 27.2*  --   PLT 137*  --  140* 182  --   HEPARINUNFRC 0.25*   < > 0.35 0.23* 0.19*  CREATININE 2.90*  --  1.94* 2.37*  --    < > = values in this interval not displayed.    Estimated Creatinine Clearance: 22.8 mL/min (A) (by C-G formula based on SCr of 2.37 mg/dL (H)).   Medical History: Past Medical History:  Diagnosis Date  . Anemia   . Chronic diastolic CHF (congestive heart failure) ([place])   . CKD (chronic kidney disease), stage IV ([place])   . Hypertension   . Persistent atrial fibrillation ([place])    a. dx 01/2019 s/p TEE DCCV.  [place] Kitchen Renal disorder   . Unilateral congenital absence of kidney     Medications:  Scheduled:  . amiodarone  200 mg Per Tube Daily  . atorvastatin  20 mg Per NG tube Daily  . chlorhexidine gluconate (MEDLINE KIT)  15 mL Mouth Rinse BID  . Chlorhexidine Gluconate Cloth  6 each Topical Daily  . furosemide  80 mg Intravenous Q8H  . insulin aspart  0-9 Units Subcutaneous Q4H  . mouth rinse  15 mL Mouth Rinse 10 times per day  . pantoprazole sodium  40 mg Per Tube QHS  . sodium chloride flush  10-40 mL Intracatheter Q12H  . sodium chloride flush  3 mL Intravenous Q12H   Infusions:  . sodium chloride 10 mL/hr at 08/08/19 2137  . sodium chloride 20 mL/hr at 08/07/19 0300  . sodium chloride    . sodium chloride    . sodium  chloride    .  ceFAZolin (ANCEF) IV Stopped (08/09/19 1929)  . dexmedetomidine (PRECEDEX) IV infusion 0.2 mcg/kg/hr (08/10/19 1400)  . feeding supplement (VITAL AF 1.2 CAL) 1,000 mL (08/10/19 1324)  . heparin 1,250 Units/hr (08/10/19 1400)    Assessment: Pt is a 73 y/o female admitted s/p out of hospital cardiac arrest. ROSC was achieved and targeted temperature management was initiated. Pt has a history of A-fib (taking eliquis PTA), diastolic heart failure, HTN, HDL, IDDM, CKD. Pharmacy has been consulted to dose heparin for A-fib.  -Repeat heparin level remains subtherapeutic at 0.19 despite increase in rate to 1250 units/hr this AM -Hgb stable at 8.45, plt 182 -Some bleeding from A-line noted this AM  Goal of Therapy:  Heparin level 0.3-0.7 units/ml aPTT 66-102 seconds Monitor platelets by anticoagulation protocol: Yes   Plan:  Increase heparin rate to 1450 units/hr Check heparin level in 8 hours Daily heparin level, CBC   Vallery Sa, PharmD Candidate 08/10/2019 2:05 PM

## 2019-08-10 NOTE — Progress Notes (Signed)
Chaplain engaged in initial visit with Jaclyn Day and her boyfriend, Konrad Dolores.  Chaplain discussed with boyfriend that she is here to [place] and him during this time.  [name] urged chaplain to follow-up later.  Chaplain will continue to follow-up.

## 2019-08-10 NOTE — Progress Notes (Signed)
[facility] ROUNDING NOTE   Subjective:   This is a 73 year old lady with a history of an out-of-hospital cardiac arrest of unclear etiology CPR for 10 minutes epinephrine before return of spontaneous circulation.  She has a history of new onset atrial fibrillation in April 4008 and diastolic dysfunction.  She has a history of chronic kidney disease stage IV status post nephrectomy.   Most recent ejection fraction improved to 45 to 50% on repeat TEE 08/09/2019.  Appreciate assistance from cardiology Dr. Gilberto Better she has a baseline GFR of about 20 cc/min from April 2020.  She was initiated on dialysis 08/06/2019, her last dialysis treatment was 08/08/2019.  She has a right IJ dialysis cath.  Urine cultures were positive for E. coli 08/03/2019. TEE at bedside today is suspicious for vegetation (small filamentous strand on the left coronary cusp). EF has recovered back to 45-50% and now back in sinus rhythm. Will treat for 6 weeks native valve MSSA endocarditis appreciate assistance from [employer] infectious disease.  Urine output 300 cc 08/10/2019  Blood pressure 135/63 pulse 74 temperature 100.1 O2 sats 96% FiO2 30%  Chest x-ray from 08/09/2019 showed probable small pleural effusions no significant change in cardiomegaly vascular congestion with bibasilar airspace disease.  Sodium 134 potassium 3.5 chloride 109 CO2 24 BUN 41 creatinine 2.37 glucose 139 calcium 8.1 phosphorus 3.2 magnesium 1.9 WBC 18.9 hemoglobin 8.5 platelets 185  Amiodarone IV, atorvastatin 20 mg daily, Protonix 40 mg daily, Ancef 1 g every 24 hours IV heparin  Objective:  Vital signs in last 24 hours:  Temp:  [97.6 F (36.4 C)-100.7 F (38.2 C)] 100.7 F (38.2 C) (10/29 0757) Pulse Rate:  [45-89] 88 (10/29 0803) Resp:  [0-48] 21 (10/29 0803) BP: (108-175)/(47-110) 155/63 (10/29 0803) SpO2:  [93 %-100 %] 96 % (10/29 0803) Arterial Line BP: (118-195)/(42-87) 171/70 (10/29 0700) FiO2 (%):  [30 %] 30 %  (10/29 0803) Weight:  [79.3 kg] 79.3 kg (10/29 0414)  Weight change: -0.7 kg Filed Weights   08/08/19 1048 08/09/19 0500 08/10/19 0414  Weight: 80 kg 80.5 kg 79.3 kg    Intake/Output: I/O last 3 completed shifts: In: 2762.7 [I.V.:1020.3; NG/GT:1302.9; IV Piggyback:439.4] Out: 450 [Urine:450]   Intake/Output this shift:  No intake/output data recorded.  General:  Intermittently confused HEENT: MMM [place] AT anicteric sclera Neck:  No JVD, no adenopathy CV:  Heart RRR  Lungs:  L/S CTA bilaterally ventilator supported Abd:  abd SNT/ND with normal BS GU:  Bladder non-palpable Extremities: +1 bilateral LE edema. Skin:  No skin rash   Basic Metabolic Panel: Recent Labs  Lab 08/06/19 0203 08/06/19 0854 08/07/19 0327 08/08/19 0320 08/09/19 0327 08/10/19 0437  NA  --  139 138 136 136 134*  K  --  3.2* 3.2* 3.5 3.7 3.5  CL  --  109 104 106 103 99  CO2  --  18* 20* 20* 25 24  GLUCOSE  --  105* 119* 123* 110* 139*  BUN  --  57* 43* 61* 34* 41*  CREATININE  --  3.82* 2.78* 2.90* 1.94* 2.37*  CALCIUM  --  7.8* 7.9* 8.0* 8.0* 8.1*  MG 2.3  --  2.1 1.9 1.7 1.9  PHOS 4.1  --  2.6 2.6 2.0* 3.2    Liver Function Tests: Recent Labs  Lab 08/03/19 1013 08/08/19 0320 08/09/19 0327  AST 26  --   --   ALT 20  --   --   ALKPHOS 94  --   --  BILITOT 1.2  --   --   PROT 4.8*  --   --   ALBUMIN 2.3* 1.7* 1.7*   Recent Labs  Lab 08/03/19 1013  LIPASE 24   No results for input(s): AMMONIA in the last 168 hours.  CBC: Recent Labs  Lab 08/03/19 1147  08/06/19 1433 08/07/19 0327 08/08/19 0320 08/09/19 0327 08/10/19 0437  WBC 32.1*   < > 16.3* 12.5* 10.3 13.8* 18.9*  NEUTROABS 30.2*  --   --  10.5* 7.5 10.4* 14.6*  HGB 11.4*   < > 8.5* 8.3* 8.3* 8.0* 8.5*  HCT 38.0   < > 27.8* 26.5* 26.7* 25.6* 27.2*  MCV 98.2   < > 96.2 93.6 93.4 92.1 92.5  PLT 278   < > 148* 143* 137* 140* 182   < > = values in this interval not displayed.    Cardiac Enzymes: No results for  input(s): CKTOTAL, CKMB, CKMBINDEX, TROPONINI in the last 168 hours.  BNP: Invalid input(s): POCBNP  CBG: Recent Labs  Lab 08/09/19 1615 08/09/19 2008 08/10/19 0016 08/10/19 0445 08/10/19 0758  GLUCAP 94 119* 127* 129* 111*    Microbiology: Results for orders placed or performed during the hospital encounter of 08/03/19  Blood culture (routine x 2)     Status: None   Collection Time: 08/03/19 10:33 AM   Specimen: BLOOD RIGHT HAND  Result Value Ref Range Status   Specimen Description BLOOD RIGHT HAND  Final   Special Requests   Final    BOTTLES DRAWN AEROBIC ONLY Blood Culture results may not be optimal due to an inadequate volume of blood received in culture bottles   Culture   Final    NO GROWTH 5 DAYS Performed at [facility], [place] [address], [place], [place] [address]    Report Status 08/08/2019 FINAL  Final  Blood culture (routine x 2)     Status: Abnormal   Collection Time: 08/03/19 10:48 AM   Specimen: BLOOD  Result Value Ref Range Status   Specimen Description BLOOD LEFT FEMORAL ARTERY  Final   Special Requests   Final    BOTTLES DRAWN AEROBIC AND ANAEROBIC Blood Culture adequate volume   Culture  Setup Time   Final    GRAM POSITIVE COCCI IN CLUSTERS AEROBIC BOTTLE ONLY CRITICAL RESULT CALLED TO, READ BACK BY AND VERIFIED WITH: J. LEDFORD,PHARMD 0207 08/04/2019 Mena Goes Performed at [facility], [place] [address] [place], [place] [address]    Culture STAPHYLOCOCCUS AUREUS (A)  Final   Report Status 08/06/2019 FINAL  Final   Organism ID, Bacteria STAPHYLOCOCCUS AUREUS  Final      Susceptibility   Staphylococcus aureus - MIC*    CIPROFLOXACIN <=0.5 SENSITIVE Sensitive     ERYTHROMYCIN <=0.25 SENSITIVE Sensitive     GENTAMICIN <=0.5 SENSITIVE Sensitive     OXACILLIN <=0.25 SENSITIVE Sensitive     TETRACYCLINE <=1 SENSITIVE Sensitive     VANCOMYCIN <=0.5 SENSITIVE Sensitive     TRIMETH/SULFA <=10 SENSITIVE Sensitive     CLINDAMYCIN <=0.25  SENSITIVE Sensitive     RIFAMPIN <=0.5 SENSITIVE Sensitive     Inducible Clindamycin NEGATIVE Sensitive     * STAPHYLOCOCCUS AUREUS  SARS Coronavirus 2 by RT PCR (hospital order, performed in [facility] hospital lab) Nasopharyngeal Nasopharyngeal Swab     Status: None   Collection Time: 08/03/19 11:35 AM   Specimen: Nasopharyngeal Swab  Result Value Ref Range Status   SARS Coronavirus 2 NEGATIVE NEGATIVE Final  Comment: (NOTE) If result is NEGATIVE SARS-CoV-2 target nucleic acids are NOT DETECTED. The SARS-CoV-2 RNA is generally detectable in upper and lower  respiratory specimens during the acute phase of infection. The lowest  concentration of SARS-CoV-2 viral copies this assay can detect is 250  copies / mL. A negative result does not preclude SARS-CoV-2 infection  and should not be used as the sole basis for treatment or other  patient management decisions.  A negative result may occur with  improper specimen collection / handling, submission of specimen other  than nasopharyngeal swab, presence of viral mutation(s) within the  areas targeted by this assay, and inadequate number of viral copies  (<250 copies / mL). A negative result must be combined with clinical  observations, patient history, and epidemiological information. If result is POSITIVE SARS-CoV-2 target nucleic acids are DETECTED. The SARS-CoV-2 RNA is generally detectable in upper and lower  respiratory specimens dur ing the acute phase of infection.  Positive  results are indicative of active infection with SARS-CoV-2.  Clinical  correlation with patient history and other diagnostic information is  necessary to determine patient infection status.  Positive results do  not rule out bacterial infection or co-infection with other viruses. If result is PRESUMPTIVE POSTIVE SARS-CoV-2 nucleic acids MAY BE PRESENT.   A presumptive positive result was obtained on the submitted specimen  and confirmed on repeat  testing.  While 2019 novel coronavirus  (SARS-CoV-2) nucleic acids may be present in the submitted sample  additional confirmatory testing may be necessary for epidemiological  and / or clinical management purposes  to differentiate between  SARS-CoV-2 and other Sarbecovirus currently known to infect humans.  If clinically indicated additional testing with an alternate test  methodology [phone]) is advised. The SARS-CoV-2 RNA is generally  detectable in upper and lower respiratory sp ecimens during the acute  phase of infection. The expected result is Negative. Fact Sheet for Patients:  StrictlyIdeas.no Fact Sheet for Healthcare Providers: BankingDealers.co.za This test is not yet approved or cleared by the [place] FDA and has been authorized for detection and/or diagnosis of SARS-CoV-2 by FDA under an Emergency Use Authorization (EUA).  This EUA will remain in effect (meaning this test can be used) for the duration of the COVID-19 declaration under Section 564(b)(1) of the Act, 21 U.S.C. section 360bbb-3(b)(1), unless the authorization is terminated or revoked sooner. Performed at [facility], [place] [address] [place], [place] [address]   Culture, Urine     Status: Abnormal   Collection Time: 08/03/19  1:13 PM   Specimen: Urine, Random  Result Value Ref Range Status   Specimen Description URINE, RANDOM  Final   Special Requests   Final    NONE Performed at [facility], [place] [address], [place], [place] [address]    Culture >=100,000 COLONIES/mL ESCHERICHIA COLI (A)  Final   Report Status 08/05/2019 FINAL  Final   Organism ID, Bacteria ESCHERICHIA COLI (A)  Final      Susceptibility   Escherichia coli - MIC*    AMPICILLIN >=32 RESISTANT Resistant     CEFAZOLIN 8 SENSITIVE Sensitive     CEFTRIAXONE <=1 SENSITIVE Sensitive     CIPROFLOXACIN >=4 RESISTANT Resistant     GENTAMICIN >=16 RESISTANT Resistant      IMIPENEM <=0.25 SENSITIVE Sensitive     NITROFURANTOIN <=16 SENSITIVE Sensitive     TRIMETH/SULFA >=320 RESISTANT Resistant     AMPICILLIN/SULBACTAM 16 INTERMEDIATE Intermediate     PIP/TAZO <=  4 SENSITIVE Sensitive     Extended ESBL NEGATIVE Sensitive     * >=100,000 COLONIES/mL ESCHERICHIA COLI  MRSA PCR Screening     Status: None   Collection Time: 08/03/19  2:46 PM   Specimen: Nasopharyngeal  Result Value Ref Range Status   MRSA by PCR NEGATIVE NEGATIVE Final    Comment:        The GeneXpert MRSA Assay (FDA approved for NASAL specimens only), is one component of a comprehensive MRSA colonization surveillance program. It is not intended to diagnose MRSA infection nor to guide or monitor treatment for MRSA infections. Performed at [facility], [place] [address] [place], [place] [address]   Respiratory Panel by PCR     Status: None   Collection Time: 08/03/19  3:56 PM   Specimen: Nasopharyngeal Swab; Respiratory  Result Value Ref Range Status   Adenovirus NOT DETECTED NOT DETECTED Final   Coronavirus 229E NOT DETECTED NOT DETECTED Final    Comment: (NOTE) The Coronavirus on the Respiratory Panel, DOES NOT test for the novel  Coronavirus (2019 nCoV)    Coronavirus HKU1 NOT DETECTED NOT DETECTED Final   Coronavirus NL63 NOT DETECTED NOT DETECTED Final   Coronavirus OC43 NOT DETECTED NOT DETECTED Final   Metapneumovirus NOT DETECTED NOT DETECTED Final   Rhinovirus / Enterovirus NOT DETECTED NOT DETECTED Final   Influenza A NOT DETECTED NOT DETECTED Final   Influenza B NOT DETECTED NOT DETECTED Final   Parainfluenza Virus 1 NOT DETECTED NOT DETECTED Final   Parainfluenza Virus 2 NOT DETECTED NOT DETECTED Final   Parainfluenza Virus 3 NOT DETECTED NOT DETECTED Final   Parainfluenza Virus 4 NOT DETECTED NOT DETECTED Final   Respiratory Syncytial Virus NOT DETECTED NOT DETECTED Final   Bordetella pertussis NOT DETECTED NOT DETECTED Final   Chlamydophila pneumoniae NOT  DETECTED NOT DETECTED Final   Mycoplasma pneumoniae NOT DETECTED NOT DETECTED Final    Comment: Performed at [facility], [place]. [address], [place], [place] [address]  Culture, blood (routine x 2)     Status: None (Preliminary result)   Collection Time: 08/07/19  6:29 PM   Specimen: BLOOD RIGHT ARM  Result Value Ref Range Status   Specimen Description BLOOD RIGHT ARM  Final   Special Requests   Final    BOTTLES DRAWN AEROBIC ONLY Blood Culture results may not be optimal due to an inadequate volume of blood received in culture bottles   Culture   Final    NO GROWTH 3 DAYS Performed at  Hospital Lab, [place] [address], [place], [place] [address]    Report Status PENDING  Incomplete  Culture, blood (routine x 2)     Status: None (Preliminary result)   Collection Time: 08/07/19  6:34 PM   Specimen: BLOOD RIGHT ARM  Result Value Ref Range Status   Specimen Description BLOOD RIGHT ARM  Final   Special Requests   Final    BOTTLES DRAWN AEROBIC ONLY Blood Culture results may not be optimal due to an inadequate volume of blood received in culture bottles   Culture   Final    NO GROWTH 3 DAYS Performed at [facility], [place] [address], [place], [place] [address]    Report Status PENDING  Incomplete    Coagulation Studies: No results for input(s): LABPROT, INR in the last 72 hours.  Urinalysis: No results for input(s): COLORURINE, LABSPEC, PHURINE, GLUCOSEU, HGBUR, BILIRUBINUR, KETONESUR, PROTEINUR, UROBILINOGEN, NITRITE, LEUKOCYTESUR in the last  72 hours.  Invalid input(s): APPERANCEUR    Imaging: Dg Chest Port 1 View  Result Date: 08/10/2019 CLINICAL DATA:  Endotracheal tube EXAM: PORTABLE CHEST 1 VIEW COMPARISON:  Yesterday FINDINGS: The endotracheal tube tip is at the carina, consider retraction by 3 cm. The orogastric tube reaches the stomach at least. Right IJ line with tip at the SVC. Hazy opacity at both lung bases. There is likely small volume pleural fluid. Large  lung volumes. Chronic cardiomegaly. No pneumothorax. IMPRESSION: 1. The endotracheal tube tip is at the carina, suggest retraction by approximately 3 cm. 2. Unchanged airspace opacity at the bases with pleural fluid. Electronically Signed   By: Monte Fantasia M.D.   On: 08/10/2019 06:33   Dg Chest Port 1 View  Result Date: 08/09/2019 CLINICAL DATA:  OG tube placement EXAM: PORTABLE CHEST 1 VIEW COMPARISON:  08/07/2019, 08/06/2019, 08/05/2019 FINDINGS: Endotracheal tube tip is less than a cm superior to the carina. Right IJ central venous catheter tip over the SVC. Esophageal tube tip below the diaphragm but non included. Small left-sided pleural effusion. No significant change in dense airspace disease at left lung base and hazy right lower lung airspace disease. Stable cardiomediastinal silhouette with mild vascular congestion. IMPRESSION: 1. Endotracheal tube tip less than a cm superior to the carina 2. Esophageal tube tip is below the diaphragm but incompletely visualized 3. Probable small pleural effusions. No significant change in cardiomegaly, vascular congestion and bibasilar airspace disease. Electronically Signed   By: Donavan Foil M.D.   On: 08/09/2019 16:09     Medications:   . sodium chloride 10 mL/hr at 08/08/19 2137  . sodium chloride 20 mL/hr at 08/07/19 0300  . sodium chloride    . sodium chloride    . sodium chloride    . amiodarone 30 mg/hr (08/10/19 0700)  .  ceFAZolin (ANCEF) IV Stopped (08/09/19 1929)  . dexmedetomidine (PRECEDEX) IV infusion    . feeding supplement (VITAL AF 1.2 CAL) 1,000 mL (08/09/19 1651)  . heparin 1,250 Units/hr (08/10/19 0700)  . propofol (DIPRIVAN) infusion Stopped (08/09/19 1117)   . atorvastatin  20 mg Per NG tube Daily  . chlorhexidine gluconate (MEDLINE KIT)  15 mL Mouth Rinse BID  . Chlorhexidine Gluconate Cloth  6 each Topical Daily  . insulin aspart  0-9 Units Subcutaneous Q4H  . mouth rinse  15 mL Mouth Rinse 10 times per day  .  pantoprazole sodium  40 mg Per Tube QHS  . sodium chloride flush  10-40 mL Intracatheter Q12H  . sodium chloride flush  3 mL Intravenous Q12H   sodium chloride, sodium chloride, alteplase, fentaNYL (SUBLIMAZE) injection, heparin, hydrALAZINE, sodium chloride flush, sodium chloride flush  Assessment/ Plan:  1. Chronic kidney disease stage IV with a baseline GFR around 19-20. Chronic kidney disease likely on the basis of longstanding hypertension in the setting of a solitary kidney.  2. Acute kidney injury. Renal ultrasound with no evidence of obstruction. She is status post left nephrectomy. Likely ATN in the setting of shock, cardiac arrest, and E. coli sepsis. Minimal urine output. Tolerated dialysis 08/08/2019 with 3 L removed.  Still making some urine 550 cc 08/08/2019.  There is no urgent indication for dialysis.  We will start some Lasix to see if we can promote diuresis.  3. Status post cardiac arrest.  ROSC 10 minutes 1 dose epinephrine.  Ejection fraction 25 to 30%.  Holding on cardiac catheterization secondary to acute kidney injury.  Underwent bedside TEE 08/09/2019  4.Non-anion  gap metabolic acidosis. Not issue at this time  5. Hypokalemia.     Repleted  6.  Atrial fibrillation continues on heparin and amiodarone monitoring electrolytes  7.  E. coli UTI/sepsis   continues Ancef 1 g every 24 hours  8. Hypophosphatemia appears replete  9.  Volume/hypertension we will start Lasix IV 80 mg q. 8 hours x 3  10. MSSA bacteremia with endocarditis 6 weeks treatment     LOS: [place] _0 _1 :30 AM

## 2019-08-10 NOTE — Progress Notes (Addendum)
eLink Physician-Brief Progress Note Patient Name: BETHE PADUANO DOB: Dec 08, 1945 MRN: [id number]   Date of Service  08/10/2019  HPI/Events of Note  Notified by RN regarding agitation and restlessness.  Pt is intubated, weaned off sedation.  Pt has been given fentanyl prn boluses but patient remains very restless.   eICU Interventions  Restart precedex.     Intervention Category Minor Interventions: Agitation / anxiety - evaluation and management  Elsie Lincoln 08/10/2019, 5:02 AM   6:32 AM Notified of K at 3.5. Pt however with worsening renal function with crea now at 2.37 <-- 1.94.  Plan> Continue to monitor.  Hold off on K repletion.  7:03 AM ETT at the carina on CXR.   Plan> Withdraw by 3cm and repeat CXR.

## 2019-08-10 NOTE — Progress Notes (Signed)
RT NOTE: RT found ETT at 26cm at the lip. RT pulled back ETT 3cm per MD order.  ETT is now at 23cm at the lip. RN at bedside. RT will continue to monitor.

## 2019-08-10 NOTE — Progress Notes (Signed)
Bleeding/Drainage at [place] site.  Cleansed, dried, bio patch in place. Secure attachment placed.

## 2019-08-11 DIAGNOSIS — I469 Cardiac arrest, cause unspecified: Secondary | ICD-10-CM | POA: Diagnosis not present

## 2019-08-11 DIAGNOSIS — I330 Acute and subacute infective endocarditis: Secondary | ICD-10-CM | POA: Diagnosis not present

## 2019-08-11 DIAGNOSIS — I482 Chronic atrial fibrillation, unspecified: Secondary | ICD-10-CM

## 2019-08-11 DIAGNOSIS — J9601 Acute respiratory failure with hypoxia: Secondary | ICD-10-CM | POA: Diagnosis not present

## 2019-08-11 DIAGNOSIS — I4819 Other persistent atrial fibrillation: Secondary | ICD-10-CM | POA: Diagnosis not present

## 2019-08-11 DIAGNOSIS — I5043 Acute on chronic combined systolic (congestive) and diastolic (congestive) heart failure: Secondary | ICD-10-CM | POA: Diagnosis not present

## 2019-08-11 DIAGNOSIS — N179 Acute kidney failure, unspecified: Secondary | ICD-10-CM | POA: Diagnosis not present

## 2019-08-11 DIAGNOSIS — R7881 Bacteremia: Secondary | ICD-10-CM | POA: Diagnosis not present

## 2019-08-11 LAB — BASIC METABOLIC PANEL
Anion gap: 13 (ref 5–15)
BUN: 49 mg/dL — ABNORMAL HIGH (ref 8–23)
CO2: 23 mmol/L (ref 22–32)
Calcium: 8.4 mg/dL — ABNORMAL LOW (ref 8.9–10.3)
Chloride: 98 mmol/L (ref 98–111)
Creatinine, Ser: 2.64 mg/dL — ABNORMAL HIGH (ref 0.44–1.00)
GFR calc Af Amer: 20 mL/min — ABNORMAL LOW (ref >60)
GFR calc non Af Amer: 17 mL/min — ABNORMAL LOW (ref >60)
Glucose, Bld: 141 mg/dL — ABNORMAL HIGH (ref 70–99)
Potassium: 4 mmol/L (ref 3.5–5.1)
Sodium: 134 mmol/L — ABNORMAL LOW (ref 135–145)

## 2019-08-11 LAB — HEPARIN LEVEL (UNFRACTIONATED)
Heparin Unfractionated: 0.38 IU/mL (ref 0.30–0.70)
Heparin Unfractionated: 0.26 IU/mL — ABNORMAL LOW (ref 0.30–0.70)
Heparin Unfractionated: 0.44 IU/mL (ref 0.30–0.70)

## 2019-08-11 LAB — RENAL FUNCTION PANEL
Albumin: 1.9 g/dL — ABNORMAL LOW (ref 3.5–5.0)
Anion gap: 14 (ref 5–15)
BUN: 40 mg/dL — ABNORMAL HIGH (ref 8–23)
CO2: 24 mmol/L (ref 22–32)
Calcium: 8.5 mg/dL — ABNORMAL LOW (ref 8.9–10.3)
Chloride: 97 mmol/L — ABNORMAL LOW (ref 98–111)
Creatinine, Ser: 2.13 mg/dL — ABNORMAL HIGH (ref 0.44–1.00)
GFR calc Af Amer: 26 mL/min — ABNORMAL LOW (ref >60)
GFR calc non Af Amer: 23 mL/min — ABNORMAL LOW (ref >60)
Glucose, Bld: 151 mg/dL — ABNORMAL HIGH (ref 70–99)
Phosphorus: 2.4 mg/dL — ABNORMAL LOW (ref 2.5–4.6)
Potassium: 3.7 mmol/L (ref 3.5–5.1)
Sodium: 135 mmol/L (ref 135–145)

## 2019-08-11 LAB — GLUCOSE, CAPILLARY
Glucose-Capillary: 119 mg/dL — ABNORMAL HIGH (ref 70–99)
Glucose-Capillary: 121 mg/dL — ABNORMAL HIGH (ref 70–99)
Glucose-Capillary: 123 mg/dL — ABNORMAL HIGH (ref 70–99)
Glucose-Capillary: 131 mg/dL — ABNORMAL HIGH (ref 70–99)
Glucose-Capillary: 134 mg/dL — ABNORMAL HIGH (ref 70–99)
Glucose-Capillary: 144 mg/dL — ABNORMAL HIGH (ref 70–99)

## 2019-08-11 LAB — CBC
HCT: 26.4 % — ABNORMAL LOW (ref 36.0–46.0)
Hemoglobin: 8.4 g/dL — ABNORMAL LOW (ref 12.0–15.0)
MCH: 28.6 pg (ref 26.0–34.0)
MCHC: 31.8 g/dL (ref 30.0–36.0)
MCV: 89.8 fL (ref 80.0–100.0)
Platelets: 186 10*3/uL (ref 150–400)
RBC: 2.94 MIL/uL — ABNORMAL LOW (ref 3.87–5.11)
RDW: 17.1 % — ABNORMAL HIGH (ref 11.5–15.5)
WBC: 18.3 10*3/uL — ABNORMAL HIGH (ref 4.0–10.5)
nRBC: 0 % (ref 0.0–0.2)

## 2019-08-11 MED ORDER — HEPARIN SODIUM (PORCINE) 1000 UNIT/ML DIALYSIS
1000.0000 [IU] | INTRAMUSCULAR | Status: DC | PRN
  Administered 2019-08-13: 2400 [IU] via INTRAVENOUS_CENTRAL
  Filled 2019-08-11: qty 6
  Filled 2019-08-11: qty 3
  Filled 2019-08-11: qty 6

## 2019-08-11 MED ORDER — NOREPINEPHRINE 4 MG/250ML-% IV SOLN
INTRAVENOUS | Status: AC
  Filled 2019-08-11: qty 250

## 2019-08-11 MED ORDER — NOREPINEPHRINE 4 MG/250ML-% IV SOLN
0.0000 ug/min | INTRAVENOUS | Status: DC
  Administered 2019-08-12 (×2): 5 ug/min via INTRAVENOUS
  Filled 2019-08-11 (×2): qty 250

## 2019-08-11 MED ORDER — SODIUM CHLORIDE 0.9 % FOR CRRT
INTRAVENOUS_CENTRAL | Status: DC | PRN
  Filled 2019-08-11: qty 1000

## 2019-08-11 MED ORDER — PRISMASOL BGK 4/2.5 32-4-2.5 MEQ/L IV SOLN
INTRAVENOUS | Status: DC
  Administered 2019-08-11 – 2019-08-13 (×18): via INTRAVENOUS_CENTRAL

## 2019-08-11 MED ORDER — VALPROIC ACID 250 MG/5ML PO SOLN
1000.0000 mg | Freq: Once | ORAL | Status: AC
  Administered 2019-08-11: 1000 mg
  Filled 2019-08-11: qty 20

## 2019-08-11 MED ORDER — VALPROIC ACID 250 MG/5ML PO SOLN
1000.0000 mg | Freq: Every day | ORAL | Status: DC
  Administered 2019-08-11 – 2019-08-12 (×2): 1000 mg
  Filled 2019-08-11 (×3): qty 20

## 2019-08-11 MED ORDER — DULOXETINE HCL 60 MG PO CPEP: 60.0000 mg | ORAL_CAPSULE | Freq: Every day | ORAL | Status: DC

## 2019-08-11 MED ORDER — ACETAMINOPHEN 160 MG/5ML PO SOLN
650.0000 mg | Freq: Four times a day (QID) | ORAL | Status: DC | PRN
  Administered 2019-08-11: 650 mg via ORAL
  Filled 2019-08-11 (×3): qty 20.3

## 2019-08-11 MED ORDER — PRISMASOL BGK 4/2.5 32-4-2.5 MEQ/L REPLACEMENT SOLN
Status: DC
  Administered 2019-08-11 – 2019-08-13 (×5): via INTRAVENOUS_CENTRAL

## 2019-08-11 MED ORDER — PRISMASOL BGK 4/2.5 32-4-2.5 MEQ/L REPLACEMENT SOLN
Status: DC
  Administered 2019-08-11 – 2019-08-13 (×5): via INTRAVENOUS_CENTRAL

## 2019-08-11 MED ORDER — HYDRALAZINE HCL 20 MG/ML IJ SOLN
10.0000 mg | Freq: Four times a day (QID) | INTRAMUSCULAR | Status: DC | PRN
  Administered 2019-08-11 – 2019-08-14 (×2): 10 mg via INTRAVENOUS
  Filled 2019-08-11 (×3): qty 1

## 2019-08-11 MED ORDER — "THROMBI-PAD 3""X3"" EX PADS"
1.0000 | MEDICATED_PAD | Freq: Once | CUTANEOUS | Status: DC
  Filled 2019-08-11: qty 1

## 2019-08-11 MED ORDER — ARIPIPRAZOLE 5 MG PO TABS
10.0000 mg | ORAL_TABLET | Freq: Two times a day (BID) | ORAL | Status: DC
  Administered 2019-08-11 – 2019-08-28 (×32): 10 mg
  Filled 2019-08-11 (×2): qty 2
  Filled 2019-08-11: qty 1
  Filled 2019-08-11: qty 2
  Filled 2019-08-11 (×3): qty 1
  Filled 2019-08-11 (×2): qty 2
  Filled 2019-08-11: qty 1
  Filled 2019-08-11: qty 2
  Filled 2019-08-11: qty 1
  Filled 2019-08-11: qty 2
  Filled 2019-08-11 (×2): qty 1
  Filled 2019-08-11: qty 2
  Filled 2019-08-11: qty 1
  Filled 2019-08-11: qty 2
  Filled 2019-08-11: qty 1
  Filled 2019-08-11 (×3): qty 2
  Filled 2019-08-11: qty 1
  Filled 2019-08-11: qty 2
  Filled 2019-08-11: qty 1
  Filled 2019-08-11 (×10): qty 2
  Filled 2019-08-11: qty 1

## 2019-08-11 MED ORDER — CEFAZOLIN SODIUM-DEXTROSE 2-4 GM/100ML-% IV SOLN
2.0000 g | Freq: Two times a day (BID) | INTRAVENOUS | Status: DC
  Administered 2019-08-11 – 2019-08-15 (×8): 2 g via INTRAVENOUS
  Filled 2019-08-11 (×8): qty 100

## 2019-08-11 NOTE — Progress Notes (Signed)
[facility]  Date of Admission:  08/03/2019      Total days of antibiotics 9   Cefazolin day 6           ASSESSMENT: Jaclyn Day is a 73 y.o. female female with cardiac arrest outside the hospital (unclear etiology). Found to have MSSA bacteremia and significantly depressed LVEF (20-25%) in the setting of AFib RVR following arrest.   TEE at bedside suspicious for filamentous aortic valve vegetation. She has had worsening leukocytosis and fever o/n for unclear reasons.   CXR yesterday more concerning for edema vs infection. Unclear as to the cause of her fever/upward trending counts. HD line was placed prior to official documentation of clearance of blood but looks OK overall. Hopefully can get off ventilator soon to assess for other potential foci of infection.  Continue Cefazolin for 6 weeks, tentatively 09/18/19.   Blood cultures on 10/29 indicate infection is clearing. Redrawn yesterday PM with resumed fever/leukocytosis so far preliminarily negative.     PLAN: 1. Continue Cefazolin  2. Continue to trend fever/WBC 3. Will at some point need tunneled PICC line but for now seems stable with current access.   Dr. Megan Salon is available over the weekend for ID questions. Will follow up again on Monday.     Principal Problem:   MSSA bacteremia Active Problems:   Cardiac arrest ([name])   Pressure injury of skin   Acute respiratory failure with hypoxia (HCC)   AKI (acute kidney injury) (HCC)   Elevated troponin   Persistent atrial fibrillation (HCC)   Acute on chronic combined systolic and diastolic CHF (congestive heart failure) (HCC)   Acute bacterial endocarditis   . amiodarone  200 mg Per Tube Daily  . ARIPiprazole  10 mg Per Tube BID  . atorvastatin  20 mg Per NG tube Daily  . chlorhexidine gluconate (MEDLINE KIT)  15 mL Mouth Rinse BID  . Chlorhexidine Gluconate Cloth  6 each Topical Daily  . insulin aspart  0-9 Units  Subcutaneous Q4H  . mouth rinse  15 mL Mouth Rinse 10 times per day  . pantoprazole sodium  40 mg Per Tube QHS  . sodium chloride flush  10-40 mL Intracatheter Q12H  . sodium chloride flush  3 mL Intravenous Q12H  . valproic acid  1,000 mg Per Tube QHS    SUBJECTIVE: Weaning on ventilator.   Interval Addition -  TMax 100.7 overnight. WBC stable but still elevated @ 18.3K Repeat blood cultures 10/26 no growth, repeated 10/29 with +fever and no growth.    Review of Systems: Review of Systems  Unable to perform ROS: Intubated    Allergies  Allergen Reactions  . Codeine Other (See Comments)    Reaction not recalled by family- was told to never take this    OBJECTIVE: Vitals:   08/11/19 1315 08/11/19 1345 08/11/19 1400 08/11/19 1415  BP: 134/71 (!) 152/89 (!) 164/75 (!) 156/86  Pulse: 60 67 72 74  Resp: 20 (!) 26 (!) 26 (!) 23  Temp:      TempSrc:      SpO2: 100% 100% 100% 100%  Weight:      Height:       Body mass index is 28.93 kg/m.  Physical Exam Constitutional:      Comments: Resting comfortably.   HENT:     Mouth/Throat:     Mouth: Mucous membranes are moist.     Pharynx: Oropharynx is  clear.  Eyes:     General: No scleral icterus.    Pupils: Pupils are equal, round, and reactive to light.  Cardiovascular:     Rate and Rhythm: Normal rate and regular rhythm.     Heart sounds: No murmur.     Comments: NSR on tele Pulmonary:     Effort: Pulmonary effort is normal.     Breath sounds: Normal breath sounds. No rhonchi.  Abdominal:     General: Bowel sounds are normal. There is no distension.  Skin:    General: Skin is warm and dry.     Capillary Refill: Capillary refill takes less than 2 seconds.  Neurological:     Mental Status: She is alert and oriented to person, place, and time.     Comments: Making good eye contact and nodding appropriately. MAE x 4     Lab Results Lab Results  Component Value Date   WBC 18.3 (H) 08/11/2019   HGB 8.4 (L)  08/11/2019   HCT 26.4 (L) 08/11/2019   MCV 89.8 08/11/2019   PLT 186 08/11/2019    Lab Results  Component Value Date   CREATININE 2.64 (H) 08/11/2019   BUN 49 (H) 08/11/2019   NA 134 (L) 08/11/2019   K 4.0 08/11/2019   CL 98 08/11/2019   CO2 23 08/11/2019    Lab Results  Component Value Date   ALT 20 08/03/2019   AST 26 08/03/2019   ALKPHOS 94 08/03/2019   BILITOT 1.2 08/03/2019     Microbiology: Recent Results (from the past 240 hour(s))  Blood culture (routine x 2)     Status: None   Collection Time: 08/03/19 10:33 AM   Specimen: BLOOD RIGHT HAND  Result Value Ref Range Status   Specimen Description BLOOD RIGHT HAND  Final   Special Requests   Final    BOTTLES DRAWN AEROBIC ONLY Blood Culture results may not be optimal due to an inadequate volume of blood received in culture bottles   Culture   Final    NO GROWTH 5 DAYS Performed at [facility], [place] [address] [place], [place] [address]    Report Status 08/08/2019 FINAL  Final  Blood culture (routine x 2)     Status: Abnormal   Collection Time: 08/03/19 10:48 AM   Specimen: BLOOD  Result Value Ref Range Status   Specimen Description BLOOD LEFT FEMORAL ARTERY  Final   Special Requests   Final    BOTTLES DRAWN AEROBIC AND ANAEROBIC Blood Culture adequate volume   Culture  Setup Time   Final    GRAM POSITIVE COCCI IN CLUSTERS AEROBIC BOTTLE ONLY CRITICAL RESULT CALLED TO, READ BACK BY AND VERIFIED WITH: J. LEDFORD,PHARMD 0207 08/04/2019 Mena Goes Performed at [facility], [place] [address] [place], [place] [address]    Culture STAPHYLOCOCCUS AUREUS (A)  Final   Report Status 08/06/2019 FINAL  Final   Organism ID, Bacteria STAPHYLOCOCCUS AUREUS  Final      Susceptibility   Staphylococcus aureus - MIC*    CIPROFLOXACIN <=0.5 SENSITIVE Sensitive     ERYTHROMYCIN <=0.25 SENSITIVE Sensitive     GENTAMICIN <=0.5 SENSITIVE Sensitive     OXACILLIN <=0.25 SENSITIVE Sensitive     TETRACYCLINE <=1 SENSITIVE  Sensitive     VANCOMYCIN <=0.5 SENSITIVE Sensitive     TRIMETH/SULFA <=10 SENSITIVE Sensitive     CLINDAMYCIN <=0.25 SENSITIVE Sensitive     RIFAMPIN <=0.5 SENSITIVE Sensitive     Inducible Clindamycin  NEGATIVE Sensitive     * STAPHYLOCOCCUS AUREUS  SARS Coronavirus 2 by RT PCR (hospital order, performed in [facility] hospital lab) Nasopharyngeal Nasopharyngeal Swab     Status: None   Collection Time: 08/03/19 11:35 AM   Specimen: Nasopharyngeal Swab  Result Value Ref Range Status   SARS Coronavirus 2 NEGATIVE NEGATIVE Final    Comment: (NOTE) If result is NEGATIVE SARS-CoV-2 target nucleic acids are NOT DETECTED. The SARS-CoV-2 RNA is generally detectable in upper and lower  respiratory specimens during the acute phase of infection. The lowest  concentration of SARS-CoV-2 viral copies this assay can detect is 250  copies / mL. A negative result does not preclude SARS-CoV-2 infection  and should not be used as the sole basis for treatment or other  patient management decisions.  A negative result may occur with  improper specimen collection / handling, submission of specimen other  than nasopharyngeal swab, presence of viral mutation(s) within the  areas targeted by this assay, and inadequate number of viral copies  (<250 copies / mL). A negative result must be combined with clinical  observations, patient history, and epidemiological information. If result is POSITIVE SARS-CoV-2 target nucleic acids are DETECTED. The SARS-CoV-2 RNA is generally detectable in upper and lower  respiratory specimens dur ing the acute phase of infection.  Positive  results are indicative of active infection with SARS-CoV-2.  Clinical  correlation with patient history and other diagnostic information is  necessary to determine patient infection status.  Positive results do  not rule out bacterial infection or co-infection with other viruses. If result is PRESUMPTIVE POSTIVE SARS-CoV-2 nucleic acids  MAY BE PRESENT.   A presumptive positive result was obtained on the submitted specimen  and confirmed on repeat testing.  While 2019 novel coronavirus  (SARS-CoV-2) nucleic acids may be present in the submitted sample  additional confirmatory testing may be necessary for epidemiological  and / or clinical management purposes  to differentiate between  SARS-CoV-2 and other Sarbecovirus currently known to infect humans.  If clinically indicated additional testing with an alternate test  methodology [phone]) is advised. The SARS-CoV-2 RNA is generally  detectable in upper and lower respiratory sp ecimens during the acute  phase of infection. The expected result is Negative. Fact Sheet for Patients:  StrictlyIdeas.no Fact Sheet for Healthcare Providers: BankingDealers.co.za This test is not yet approved or cleared by the [place] FDA and has been authorized for detection and/or diagnosis of SARS-CoV-2 by FDA under an Emergency Use Authorization (EUA).  This EUA will remain in effect (meaning this test can be used) for the duration of the COVID-19 declaration under Section 564(b)(1) of the Act, 21 U.S.C. section 360bbb-3(b)(1), unless the authorization is terminated or revoked sooner. Performed at [facility], [place] [address] [place], [place] [address]   Culture, Urine     Status: Abnormal   Collection Time: 08/03/19  1:13 PM   Specimen: Urine, Random  Result Value Ref Range Status   Specimen Description URINE, RANDOM  Final   Special Requests   Final    NONE Performed at [facility], [place] [address] [place], [place] [address]    Culture >=100,000 COLONIES/mL ESCHERICHIA COLI (A)  Final   Report Status 08/05/2019 FINAL  Final   Organism ID, Bacteria ESCHERICHIA COLI (A)  Final      Susceptibility   Escherichia coli - MIC*    AMPICILLIN >=32 RESISTANT Resistant     CEFAZOLIN 8 SENSITIVE Sensitive  CEFTRIAXONE <=1  SENSITIVE Sensitive     CIPROFLOXACIN >=4 RESISTANT Resistant     GENTAMICIN >=16 RESISTANT Resistant     IMIPENEM <=0.25 SENSITIVE Sensitive     NITROFURANTOIN <=16 SENSITIVE Sensitive     TRIMETH/SULFA >=320 RESISTANT Resistant     AMPICILLIN/SULBACTAM 16 INTERMEDIATE Intermediate     PIP/TAZO <=4 SENSITIVE Sensitive     Extended ESBL NEGATIVE Sensitive     * >=100,000 COLONIES/mL ESCHERICHIA COLI  MRSA PCR Screening     Status: None   Collection Time: 08/03/19  2:46 PM   Specimen: Nasopharyngeal  Result Value Ref Range Status   MRSA by PCR NEGATIVE NEGATIVE Final    Comment:        The GeneXpert MRSA Assay (FDA approved for NASAL specimens only), is one component of a comprehensive MRSA colonization surveillance program. It is not intended to diagnose MRSA infection nor to guide or monitor treatment for MRSA infections. Performed at [facility], [place] [address] [place], [place] [address]   Respiratory Panel by PCR     Status: None   Collection Time: 08/03/19  3:56 PM   Specimen: Nasopharyngeal Swab; Respiratory  Result Value Ref Range Status   Adenovirus NOT DETECTED NOT DETECTED Final   Coronavirus 229E NOT DETECTED NOT DETECTED Final    Comment: (NOTE) The Coronavirus on the Respiratory Panel, DOES NOT test for the novel  Coronavirus (2019 nCoV)    Coronavirus HKU1 NOT DETECTED NOT DETECTED Final   Coronavirus NL63 NOT DETECTED NOT DETECTED Final   Coronavirus OC43 NOT DETECTED NOT DETECTED Final   Metapneumovirus NOT DETECTED NOT DETECTED Final   Rhinovirus / Enterovirus NOT DETECTED NOT DETECTED Final   Influenza A NOT DETECTED NOT DETECTED Final   Influenza B NOT DETECTED NOT DETECTED Final   Parainfluenza Virus 1 NOT DETECTED NOT DETECTED Final   Parainfluenza Virus 2 NOT DETECTED NOT DETECTED Final   Parainfluenza Virus 3 NOT DETECTED NOT DETECTED Final   Parainfluenza Virus 4 NOT DETECTED NOT DETECTED Final   Respiratory Syncytial Virus NOT DETECTED  NOT DETECTED Final   Bordetella pertussis NOT DETECTED NOT DETECTED Final   Chlamydophila pneumoniae NOT DETECTED NOT DETECTED Final   Mycoplasma pneumoniae NOT DETECTED NOT DETECTED Final    Comment: Performed at [facility], [place]. [address], [place], [place] [address]  Culture, blood (routine x 2)     Status: None (Preliminary result)   Collection Time: 08/07/19  6:29 PM   Specimen: BLOOD RIGHT ARM  Result Value Ref Range Status   Specimen Description BLOOD RIGHT ARM  Final   Special Requests   Final    BOTTLES DRAWN AEROBIC ONLY Blood Culture results may not be optimal due to an inadequate volume of blood received in culture bottles   Culture   Final    NO GROWTH 4 DAYS Performed at [facility], [place] [address] [place], [place] [address]    Report Status PENDING  Incomplete  Culture, blood (routine x 2)     Status: None (Preliminary result)   Collection Time: 08/07/19  6:34 PM   Specimen: BLOOD RIGHT ARM  Result Value Ref Range Status   Specimen Description BLOOD RIGHT ARM  Final   Special Requests   Final    BOTTLES DRAWN AEROBIC ONLY Blood Culture results may not be optimal due to an inadequate volume of blood received in culture bottles   Culture   Final    NO GROWTH 4  DAYS Performed at [facility], [place] [address], [place], [place] [address]    Report Status PENDING  Incomplete  Culture, blood (routine x 2)     Status: None (Preliminary result)   Collection Time: 08/10/19 10:16 AM   Specimen: BLOOD RIGHT HAND  Result Value Ref Range Status   Specimen Description BLOOD RIGHT HAND  Final   Special Requests AEROBIC BOTTLE ONLY Blood Culture adequate volume  Final   Culture   Final    NO GROWTH < 24 HOURS Performed at [facility], [place] [address], [place], [place] [address]    Report Status PENDING  Incomplete  Culture, blood (routine x 2)     Status: None (Preliminary result)   Collection Time: 08/10/19 10:17 AM   Specimen: BLOOD RIGHT HAND   Result Value Ref Range Status   Specimen Description BLOOD RIGHT HAND  Final   Special Requests AEROBIC BOTTLE ONLY Blood Culture adequate volume  Final   Culture   Final    NO GROWTH < 24 HOURS Performed at [facility], [place] [address] [place], [place] [address]    Report Status PENDING  Incomplete     Janene Madeira, MSN, NP-C [facility] [email or web address] Pager: [phone] Office: [phone] [place]: [phone]

## 2019-08-11 NOTE — Progress Notes (Addendum)
ANTICOAGULATION CONSULT NOTE - Follow Up Consult  Pharmacy Consult for heparin Indication: atrial fibrillation   Labs: Recent Labs    08/08/19 0320  08/09/19 0327 08/10/19 0437 08/10/19 1311 08/10/19 2316  HGB 8.3*  --  8.0* 8.5*  --   --   HCT 26.7*  --  25.6* 27.2*  --   --   PLT 137*  --  140* 182  --   --   HEPARINUNFRC 0.25*   < > 0.35 0.23* 0.19* 0.38  CREATININE 2.90*  --  1.94* 2.37*  --   --    < > = values in this interval not displayed.    Assessment/Plan:  73yo female therapeutic on heparin after rate changes. Will continue gtt at current rate and confirm stable with am labs.   Wynona Neat, PharmD, BCPS  08/11/2019,12:29 AM   Addendum: AM heparin level now below goal of 0.26.  Will increase heparin gtt by 2 units/kg/hr to 1600 units/hr and check level in 8 hours.   VB 5:57 AM

## 2019-08-11 NOTE — Progress Notes (Addendum)
NAME:  Jaclyn Day, MRN:  [id number], DOB:  07-28-1946, LOS: 8 ADMISSION DATE:  08/03/2019, CONSULTATION DATE:  10/22 REFERRING MD:  Dr. Sherril Croon, CHIEF COMPLAINT:  Post arrest    Brief History   73yo female admitted after out of hospital cardiac arrest, unclear etiology CPR started by fire 10 min CPR with 1 Epi before ROSC. PCCM called for admission and need for targeted temperature management and ventilator management.   Of note, admitted at this facility 01/25/2019-01/31/2019 for new onset A-fib with development of diastolic congestive heart failure, during admission she underwent TEE and cardioversion with transition to SR.   Past Medical History  Congenital unilateral kidney  CKD stage IV (baseline creatinine 2.2-2.8) w/hx of nephrectomy  Hypertension  Persistent A-fib on anticoagulation/ Eliquis Chronic diastolic CHF Anemia  Cardioversion and TEE 01/31/2019 Mood disorder  Significant Hospital Events   10/22 Admitted post arrest, intubated, initiation of targeted temperature management 36 celsius   10/23 + 800 ml I/O, TTM, Weaned off Levophed overnight, ECHO: LVEF: 20-25%, global hypokinesis, RV midly reduced function with normal size, mild MR, normal pulmonary pressures.  Lactate cleared  10/24 - 40% fio2, Oliguric v Anuric. On amio gtt, heparin gtt, neo gtt . Being weaned off neo gtt. Rewarmed to 37. Non purposeful only (not on sedation). Growing e colii in urine from admission  10/25 - staph identified in blood and vanc started yesterday. Still with poor Ur OP. Foley leak suspected and foley stopped. No new foley . RT says patient followed commands and wants to do SBT. On amio gtt. Off leveophed an dneo. On Heparin gtt.  iHD w/ 1L off   10/26-  following simple commands, placed on precedex for agitation, febrile- re-cultured and femoral CVL discontinued   10/28- converted to NSR, TEE   Consults:  PCCM ID Cards  Procedures:  ETT 10/22 >> R radial Aline 10/22 >> 10/27  Right femoral CVC 10/22 >>10/26 R IJ trialysis 10/25 >>  Significant Diagnostic Tests:  10/22:  ECHO: LVEF: 20-25%, global hypokinesis, RV midly reduced function with normal size, mild MR, normal pulmonary pressures.  10/22: CTH without acute intracranial pathology  [place] 10/23 - IMPRESSION: 1. Status post left nephrectomy 2. Echogenic right kidney with cortical thinning consistent with medical renal disease. No hydronephrosis. Cysts, fewer than 10 in the right kidney. 3. Right pleural effusion  10/26 limited TTE >> EF 25-30%, mod reduced RV function  10/29 TEE - EF 55-60%, severely dilated left atrium, small filamentous structure on aortic valve, suggestive of vegetation  Micro Data:  Blood culture 10/22 >> staph nos 1/4 COVID 10/22 >> Negative RPP 10/22 >> Negative Urine 10/22 - E colii 10/26 BCx 2 >> pending  Antimicrobials:  Rocephin 10/22 >> 10/25 Vanc (staph in blood ) 10/24  Cefazolin 10/25 >>  Interim history/subjective:  Failed SBT this morning due to tachypnea  Objective   Blood pressure (!) 93/49, pulse 63, temperature 99.2 F (37.3 C), temperature source Oral, resp. rate (!) 0, height 5\' 6"  (1.676 m), weight 81.3 kg, SpO2 98 %.    Vent Mode: PRVC FiO2 (%):  [30 %] 30 % Set Rate:  [20 bmp] 20 bmp Vt Set:  [470 mL] 470 mL PEEP:  [5 cmH20] 5 cmH20 Pressure Support:  [15 cmH20] 15 cmH20 Plateau Pressure:  [18 cmH20-24 cmH20] 24 cmH20   Intake/Output Summary (Last 24 hours) at 08/11/2019 0755 Last data filed at 08/11/2019 0600 Gross per 24 hour  Intake 1810.17 ml  Output 1250 ml  Net 560.17 ml   Filed Weights   08/09/19 0500 08/10/19 0414 08/11/19 0422  Weight: 80.5 kg 79.3 kg 81.3 kg   Physical Exam: General: Elderly female laying in bed, chronically ill-appearing, no acute distress HENT: [place], AT, ETT in place Eyes: EOMI, no scleral icterus Respiratory:Diminished breath sounds bilaterally. No crackles, wheezing or rales Cardiovascular: RRR, -M/R/G, no  JVD GI: BS+, soft, nontender Extremities:-Edema,-tenderness Neuro: Awake, alert, follows commands GU: Purewick in place  Resolved Hospital Problem list    Assessment & Plan:  This is a 73 yo F being admitted to ICU for witnessed out of hospital cardiac arrest, unclear etiology, with field ROSC, resultant respiratory failure and encephalopathy s/p TTM at 36 degrees celsius found to have E. Coli UTI and 1/4+ stap aureus on blood cultures.  Acute Encephlopathy - concern for post arrest anoxia - s/p 36C TTM - rewarmed 08/05/2019 Mental status improving P:  Resumed low-dose Precedex for anxiety yesterday. Off precedex this morning Will restart home SSRI Prn fentanyl, avoid benzo's, RASS goal 0-/1  Acute hypoxic respiratory failure due to cardiac arrest, pulmonary edema  Concerned that her respiratory status is still limited by pulmonary edema P: Full vent support with PS as tolerated Diuresis/dialysis per Nephrology. Will discuss with consult team regarding considering volume removal to facilitate extubation   Staph aureus bacteremia secondary endocarditis  E colii UTI  Rising WBC curve and increase temp. Tmax 100.7  P: ID following Continue cefazolin. End date six weeks from last negative culture 08/07/19 - 09/18/19 Follow-up 10/29 cultures to ensure negative Trend fever / WBC curve  Out of hospital cardiac arrest s/p TTM Suspect this was precipitated by E Colii UTI +/- staph aureus bacteremia P:  Telemetry Goal MAP > 65 Goal K and Mg, 4 and 2 respectively  Acute systolic heart failure (baseline normal EF in April 2020) -ECHO 08/03/2019: LVEF: 20-25%, global hypokinesis, RV midly reduced function with normal size, mild MR, normal pulmonary pressures.  -TTE 10/26 shows EF 20-25% -TEE EF recovered 55-60% P:  [facility] input. Cath deferred due to AKI  No BB/ ACEi Volume removal per iHD  PAF  Currently in NSR P:  Cards following Amiodarone gtt per cards  Continue heparin gtt  Acute on chronic CKD IV, likely ATN component given cardiac arrest Congential Unilateral Kidney   - remains oliguric, renal US without evidence of obstruction  - started on iHD 10/25, 10/27 P:   Diuresis/Dialysis per Nephrology  Electrolyte replacement per HD  Ongoing purwick/ monitor strict I/Os  Trend BMP / phos/ mag  Hx of mood disorder P:  Restart home depakote and abilify Holding home cymbalta  Best practice:  Diet:  TF Pain/Anxiety/Delirium protocol (if indicated): prn fentanyl  VAP protocol (if indicated): Yes DVT prophylaxis: Heparin gtt  GI prophylaxis: PPI  Glucose control: SSI Q4H - remains controlled Mobility: Bed rest  Code Status: FULL  Family Communication: longterm boyfriend (states HPOA as well) updated 10/30. pts sister approves giving information and making decisions to him Disposition: ICU   The patient is critically ill and requires high complexity decision making for assessment and support, frequent evaluation and titration of therapies, application of advanced monitoring technologies and extensive interpretation of multiple databases.   Critical Care Time devoted to patient care services described in this note is 38 Minutes.  Rodman Pickle, M.D. [facility] Pulmonary/Critical Care Medicine 08/11/2019 7:55 AM  Pager: [phone] After hours pager: [phone]

## 2019-08-11 NOTE — Progress Notes (Addendum)
[facility] ROUNDING NOTE   Subjective:   This is a 73 year old lady with a history of an out-of-hospital cardiac arrest of unclear etiology CPR for 10 minutes epinephrine before return of spontaneous circulation.  She has a history of new onset atrial fibrillation in April 7564 and diastolic dysfunction.  She has a history of chronic kidney disease stage IV status post nephrectomy.   Most recent ejection fraction improved to 45 to 50% on repeat TEE 08/09/2019.  Appreciate assistance from cardiology Dr. Gilberto Better she has a baseline GFR of about 20 cc/min from April 2020.  She was initiated on dialysis 08/06/2019, her last dialysis treatment was 08/08/2019.  She has a right IJ dialysis cath.  Urine cultures were positive for E. coli 08/03/2019. TEE at bedside today is suspicious for vegetation (small filamentous strand on the left coronary cusp). EF has recovered back to 45-50% and now back in sinus rhythm. Will treat for 6 weeks native valve MSSA endocarditis appreciate assistance from [employer] infectious disease.  Appears to have encephalopathy that is not resolving.  Urine output 900 cc 08/10/2019  Blood pressure 130/61 pulse 81 temperature 99.7 O2 sats 96% FiO2 30%  Chest x-ray from 08/09/2019 showed probable small pleural effusions no significant change in cardiomegaly vascular congestion with bibasilar airspace disease.  Sodium 134 potassium 4.0 chloride 98 CO2 23 BUN 49 creatinine 2.64 glucose 141 calcium 8.4 WBC 18.9 hemoglobin 8.5 platelets 185  Amiodarone IV, atorvastatin 20 mg daily, Protonix 40 mg daily, Ancef 1 g every 24 hours IV heparin  Objective:  Vital signs in last 24 hours:  Temp:  [98.3 F (36.8 C)-99.7 F (37.6 C)] 99.7 F (37.6 C) (10/30 0806) Pulse Rate:  [59-91] 81 (10/30 0808) Resp:  [0-33] 26 (10/30 0808) BP: (81-184)/(43-119) 130/61 (10/30 0808) SpO2:  [97 %-100 %] 99 % (10/30 0828) Arterial Line BP: (84-161)/(33-68) 156/57 (10/29  1500) FiO2 (%):  [30 %] 30 % (10/30 0828) Weight:  [81.3 kg] 81.3 kg (10/30 0422)  Weight change: 2 kg Filed Weights   08/09/19 0500 08/10/19 0414 08/11/19 0422  Weight: 80.5 kg 79.3 kg 81.3 kg    Intake/Output: I/O last 3 completed shifts: In: 3031.8 [I.V.:832.5; NG/GT:2000; IV Piggyback:199.3] Out: 3329 [Urine:1550]   Intake/Output this shift:  No intake/output data recorded.  General:  Intermittently confused HEENT: MMM [place] AT anicteric sclera Neck:  No JVD, no adenopathy CV:  Heart RRR  Lungs:  L/S CTA bilaterally ventilator supported Abd:  abd SNT/ND with normal BS GU:  Bladder non-palpable Extremities: +1 bilateral LE edema. Skin:  No skin rash   Basic Metabolic Panel: Recent Labs  Lab 08/06/19 0203  08/07/19 0327 08/08/19 0320 08/09/19 0327 08/10/19 0437 08/11/19 0458  NA  --    < > 138 136 136 134* 134*  K  --    < > 3.2* 3.5 3.7 3.5 4.0  CL  --    < > 104 106 103 99 98  CO2  --    < > 20* 20* '25 24 23  ' GLUCOSE  --    < > 119* 123* 110* 139* 141*  BUN  --    < > 43* 61* 34* 41* 49*  CREATININE  --    < > 2.78* 2.90* 1.94* 2.37* 2.64*  CALCIUM  --    < > 7.9* 8.0* 8.0* 8.1* 8.4*  MG 2.3  --  2.1 1.9 1.7 1.9  --   PHOS 4.1  --  2.6 2.6 2.0*  3.2  --    < > = values in this interval not displayed.    Liver Function Tests: Recent Labs  Lab 08/08/19 0320 08/09/19 0327  ALBUMIN 1.7* 1.7*   No results for input(s): LIPASE, AMYLASE in the last 168 hours. No results for input(s): AMMONIA in the last 168 hours.  CBC: Recent Labs  Lab 08/06/19 1433 08/07/19 0327 08/08/19 0320 08/09/19 0327 08/10/19 0437  WBC 16.3* 12.5* 10.3 13.8* 18.9*  NEUTROABS  --  10.5* 7.5 10.4* 14.6*  HGB 8.5* 8.3* 8.3* 8.0* 8.5*  HCT 27.8* 26.5* 26.7* 25.6* 27.2*  MCV 96.2 93.6 93.4 92.1 92.5  PLT 148* 143* 137* 140* 182    Cardiac Enzymes: No results for input(s): CKTOTAL, CKMB, CKMBINDEX, TROPONINI in the last 168 hours.  BNP: Invalid input(s):  POCBNP  CBG: Recent Labs  Lab 08/10/19 1516 08/10/19 2112 08/10/19 2312 08/11/19 0421 08/11/19 0807  GLUCAP 108* 125* 128* 144* 20*    Microbiology: Results for orders placed or performed during the hospital encounter of 08/03/19  Blood culture (routine x 2)     Status: None   Collection Time: 08/03/19 10:33 AM   Specimen: BLOOD RIGHT HAND  Result Value Ref Range Status   Specimen Description BLOOD RIGHT HAND  Final   Special Requests   Final    BOTTLES DRAWN AEROBIC ONLY Blood Culture results may not be optimal due to an inadequate volume of blood received in culture bottles   Culture   Final    NO GROWTH 5 DAYS Performed at [facility], [place] [address], [place], [place] [address]    Report Status 08/08/2019 FINAL  Final  Blood culture (routine x 2)     Status: Abnormal   Collection Time: 08/03/19 10:48 AM   Specimen: BLOOD  Result Value Ref Range Status   Specimen Description BLOOD LEFT FEMORAL ARTERY  Final   Special Requests   Final    BOTTLES DRAWN AEROBIC AND ANAEROBIC Blood Culture adequate volume   Culture  Setup Time   Final    GRAM POSITIVE COCCI IN CLUSTERS AEROBIC BOTTLE ONLY CRITICAL RESULT CALLED TO, READ BACK BY AND VERIFIED WITH: J. LEDFORD,PHARMD 0207 08/04/2019 Mena Goes Performed at [facility], [place] [address] [place],  [address]    Culture STAPHYLOCOCCUS AUREUS (A)  Final   Report Status 08/06/2019 FINAL  Final   Organism ID, Bacteria STAPHYLOCOCCUS AUREUS  Final      Susceptibility   Staphylococcus aureus - MIC*    CIPROFLOXACIN <=0.5 SENSITIVE Sensitive     ERYTHROMYCIN <=0.25 SENSITIVE Sensitive     GENTAMICIN <=0.5 SENSITIVE Sensitive     OXACILLIN <=0.25 SENSITIVE Sensitive     TETRACYCLINE <=1 SENSITIVE Sensitive     VANCOMYCIN <=0.5 SENSITIVE Sensitive     TRIMETH/SULFA <=10 SENSITIVE Sensitive     CLINDAMYCIN <=0.25 SENSITIVE Sensitive     RIFAMPIN <=0.5 SENSITIVE Sensitive     Inducible Clindamycin NEGATIVE  Sensitive     * STAPHYLOCOCCUS AUREUS  SARS Coronavirus 2 by RT PCR (hospital order, performed in [place] hospital lab) Nasopharyngeal Nasopharyngeal Swab     Status: None   Collection Time: 08/03/19 11:35 AM   Specimen: Nasopharyngeal Swab  Result Value Ref Range Status   SARS Coronavirus 2 NEGATIVE NEGATIVE Final    Comment: (NOTE) If result is NEGATIVE SARS-CoV-2 target nucleic acids are NOT DETECTED. The SARS-CoV-2 RNA is generally detectable in upper and lower  respiratory specimens during the acute  phase of infection. The lowest  concentration of SARS-CoV-2 viral copies this assay can detect is 250  copies / mL. A negative result does not preclude SARS-CoV-2 infection  and should not be used as the sole basis for treatment or other  patient management decisions.  A negative result may occur with  improper specimen collection / handling, submission of specimen other  than nasopharyngeal swab, presence of viral mutation(s) within the  areas targeted by this assay, and inadequate number of viral copies  (<250 copies / mL). A negative result must be combined with clinical  observations, patient history, and epidemiological information. If result is POSITIVE SARS-CoV-2 target nucleic acids are DETECTED. The SARS-CoV-2 RNA is generally detectable in upper and lower  respiratory specimens dur ing the acute phase of infection.  Positive  results are indicative of active infection with SARS-CoV-2.  Clinical  correlation with patient history and other diagnostic information is  necessary to determine patient infection status.  Positive results do  not rule out bacterial infection or co-infection with other viruses. If result is PRESUMPTIVE POSTIVE SARS-CoV-2 nucleic acids MAY BE PRESENT.   A presumptive positive result was obtained on the submitted specimen  and confirmed on repeat testing.  While 2019 novel coronavirus  (SARS-CoV-2) nucleic acids may be present in the submitted  sample  additional confirmatory testing may be necessary for epidemiological  and / or clinical management purposes  to differentiate between  SARS-CoV-2 and other Sarbecovirus currently known to infect humans.  If clinically indicated additional testing with an alternate test  methodology [phone]) is advised. The SARS-CoV-2 RNA is generally  detectable in upper and lower respiratory sp ecimens during the acute  phase of infection. The expected result is Negative. Fact Sheet for Patients:  StrictlyIdeas.no Fact Sheet for Healthcare Providers: BankingDealers.co.za This test is not yet approved or cleared by the [place] FDA and has been authorized for detection and/or diagnosis of SARS-CoV-2 by FDA under an Emergency Use Authorization (EUA).  This EUA will remain in effect (meaning this test can be used) for the duration of the COVID-19 declaration under Section 564(b)(1) of the Act, 21 U.S.C. section 360bbb-3(b)(1), unless the authorization is terminated or revoked sooner. Performed at [facility], [place] [address], [place], [place] [address]   Culture, Urine     Status: Abnormal   Collection Time: 08/03/19  1:13 PM   Specimen: Urine, Random  Result Value Ref Range Status   Specimen Description URINE, RANDOM  Final   Special Requests   Final    NONE Performed at [facility], [place] [address], [place], [place] [address]    Culture >=100,000 COLONIES/mL ESCHERICHIA COLI (A)  Final   Report Status 08/05/2019 FINAL  Final   Organism ID, Bacteria ESCHERICHIA COLI (A)  Final      Susceptibility   Escherichia coli - MIC*    AMPICILLIN >=32 RESISTANT Resistant     CEFAZOLIN 8 SENSITIVE Sensitive     CEFTRIAXONE <=1 SENSITIVE Sensitive     CIPROFLOXACIN >=4 RESISTANT Resistant     GENTAMICIN >=16 RESISTANT Resistant     IMIPENEM <=0.25 SENSITIVE Sensitive     NITROFURANTOIN <=16 SENSITIVE Sensitive     TRIMETH/SULFA  >=320 RESISTANT Resistant     AMPICILLIN/SULBACTAM 16 INTERMEDIATE Intermediate     PIP/TAZO <=4 SENSITIVE Sensitive     Extended ESBL NEGATIVE Sensitive     * >=100,000 COLONIES/mL ESCHERICHIA COLI  MRSA PCR Screening     Status:  None   Collection Time: 08/03/19  2:46 PM   Specimen: Nasopharyngeal  Result Value Ref Range Status   MRSA by PCR NEGATIVE NEGATIVE Final    Comment:        The GeneXpert MRSA Assay (FDA approved for NASAL specimens only), is one component of a comprehensive MRSA colonization surveillance program. It is not intended to diagnose MRSA infection nor to guide or monitor treatment for MRSA infections. Performed at [facility], [place] [address], [place], [place] [address]   Respiratory Panel by PCR     Status: None   Collection Time: 08/03/19  3:56 PM   Specimen: Nasopharyngeal Swab; Respiratory  Result Value Ref Range Status   Adenovirus NOT DETECTED NOT DETECTED Final   Coronavirus 229E NOT DETECTED NOT DETECTED Final    Comment: (NOTE) The Coronavirus on the Respiratory Panel, DOES NOT test for the novel  Coronavirus (2019 nCoV)    Coronavirus HKU1 NOT DETECTED NOT DETECTED Final   Coronavirus NL63 NOT DETECTED NOT DETECTED Final   Coronavirus OC43 NOT DETECTED NOT DETECTED Final   Metapneumovirus NOT DETECTED NOT DETECTED Final   Rhinovirus / Enterovirus NOT DETECTED NOT DETECTED Final   Influenza A NOT DETECTED NOT DETECTED Final   Influenza B NOT DETECTED NOT DETECTED Final   Parainfluenza Virus 1 NOT DETECTED NOT DETECTED Final   Parainfluenza Virus 2 NOT DETECTED NOT DETECTED Final   Parainfluenza Virus 3 NOT DETECTED NOT DETECTED Final   Parainfluenza Virus 4 NOT DETECTED NOT DETECTED Final   Respiratory Syncytial Virus NOT DETECTED NOT DETECTED Final   Bordetella pertussis NOT DETECTED NOT DETECTED Final   Chlamydophila pneumoniae NOT DETECTED NOT DETECTED Final   Mycoplasma pneumoniae NOT DETECTED NOT DETECTED Final    Comment:  Performed at [facility], [place]. [address] [place], [place] [address]  Culture, blood (routine x 2)     Status: None (Preliminary result)   Collection Time: 08/07/19  6:29 PM   Specimen: BLOOD RIGHT ARM  Result Value Ref Range Status   Specimen Description BLOOD RIGHT ARM  Final   Special Requests   Final    BOTTLES DRAWN AEROBIC ONLY Blood Culture results may not be optimal due to an inadequate volume of blood received in culture bottles   Culture   Final    NO GROWTH 4 DAYS Performed at [facility], [place] [address], [place], [place] [address]    Report Status PENDING  Incomplete  Culture, blood (routine x 2)     Status: None (Preliminary result)   Collection Time: 08/07/19  6:34 PM   Specimen: BLOOD RIGHT ARM  Result Value Ref Range Status   Specimen Description BLOOD RIGHT ARM  Final   Special Requests   Final    BOTTLES DRAWN AEROBIC ONLY Blood Culture results may not be optimal due to an inadequate volume of blood received in culture bottles   Culture   Final    NO GROWTH 4 DAYS Performed at [facility], [place] [address], [place], [place] [address]    Report Status PENDING  Incomplete  Culture, blood (routine x 2)     Status: None (Preliminary result)   Collection Time: 08/10/19 10:16 AM   Specimen: BLOOD RIGHT HAND  Result Value Ref Range Status   Specimen Description BLOOD RIGHT HAND  Final   Special Requests AEROBIC BOTTLE ONLY Blood Culture adequate volume  Final   Culture   Final    NO  GROWTH < 24 HOURS Performed at [facility], [place] [address] [place], [place] [address]    Report Status PENDING  Incomplete  Culture, blood (routine x 2)     Status: None (Preliminary result)   Collection Time: 08/10/19 10:17 AM   Specimen: BLOOD RIGHT HAND  Result Value Ref Range Status   Specimen Description BLOOD RIGHT HAND  Final   Special Requests AEROBIC BOTTLE ONLY Blood Culture adequate volume  Final   Culture   Final    NO GROWTH < 24  HOURS Performed at [facility], [name] [address], [place], [place] [address]    Report Status PENDING  Incomplete    Coagulation Studies: No results for input(s): LABPROT, INR in the last 72 hours.  Urinalysis: No results for input(s): COLORURINE, LABSPEC, PHURINE, GLUCOSEU, HGBUR, BILIRUBINUR, KETONESUR, PROTEINUR, UROBILINOGEN, NITRITE, LEUKOCYTESUR in the last 72 hours.  Invalid input(s): APPERANCEUR    Imaging: Dg Chest Port 1 View  Result Date: 08/10/2019 CLINICAL DATA:  Endotracheal tube adjustment EXAM: PORTABLE CHEST 1 VIEW COMPARISON:  Earlier today at 0500 hours FINDINGS: 0800 hours. Endotracheal tube has been retracted, 2.5 cm above carina. Nasogastric tube extends beyond the inferior aspect of the film. Right internal jugular line tip at low SVC. Mild cardiomegaly. Small bilateral pleural effusions. No pneumothorax. Mild interstitial edema. Left greater than right base airspace disease. IMPRESSION: Retraction of endotracheal tube, now 2.5 cm above carina. Otherwise, similar appearance of congestive heart failure, bilateral pleural effusions, and bibasilar Airspace disease, likely atelectasis. Electronically Signed   By: Abigail Miyamoto M.D.   On: 08/10/2019 08:56   Dg Chest Port 1 View  Result Date: 08/10/2019 CLINICAL DATA:  Endotracheal tube EXAM: PORTABLE CHEST 1 VIEW COMPARISON:  Yesterday FINDINGS: The endotracheal tube tip is at the carina, consider retraction by 3 cm. The orogastric tube reaches the stomach at least. Right IJ line with tip at the SVC. Hazy opacity at both lung bases. There is likely small volume pleural fluid. Large lung volumes. Chronic cardiomegaly. No pneumothorax. IMPRESSION: 1. The endotracheal tube tip is at the carina, suggest retraction by approximately 3 cm. 2. Unchanged airspace opacity at the bases with pleural fluid. Electronically Signed   By: Monte Fantasia M.D.   On: 08/10/2019 06:33   Dg Chest Port 1 View  Result Date:  08/09/2019 CLINICAL DATA:  OG tube placement EXAM: PORTABLE CHEST 1 VIEW COMPARISON:  08/07/2019, 08/06/2019, 08/05/2019 FINDINGS: Endotracheal tube tip is less than a cm superior to the carina. Right IJ central venous catheter tip over the SVC. Esophageal tube tip below the diaphragm but non included. Small left-sided pleural effusion. No significant change in dense airspace disease at left lung base and hazy right lower lung airspace disease. Stable cardiomediastinal silhouette with mild vascular congestion. IMPRESSION: 1. Endotracheal tube tip less than a cm superior to the carina 2. Esophageal tube tip is below the diaphragm but incompletely visualized 3. Probable small pleural effusions. No significant change in cardiomegaly, vascular congestion and bibasilar airspace disease. Electronically Signed   By: Donavan Foil M.D.   On: 08/09/2019 16:09     Medications:   . sodium chloride 10 mL/hr at 08/08/19 2137  . sodium chloride 20 mL/hr at 08/07/19 0300  . sodium chloride    . sodium chloride    . sodium chloride    .  ceFAZolin (ANCEF) IV Stopped (08/11/19 2671)  . dexmedetomidine (PRECEDEX) IV infusion Stopped (08/11/19 0745)  . feeding supplement (VITAL AF 1.2  CAL) 55 mL/hr at 08/11/19 0300  . heparin 1,600 Units/hr (08/11/19 0600)   . amiodarone  200 mg Per Tube Daily  . atorvastatin  20 mg Per NG tube Daily  . chlorhexidine gluconate (MEDLINE KIT)  15 mL Mouth Rinse BID  . Chlorhexidine Gluconate Cloth  6 each Topical Daily  . DULoxetine  60 mg Oral Daily  . insulin aspart  0-9 Units Subcutaneous Q4H  . mouth rinse  15 mL Mouth Rinse 10 times per day  . pantoprazole sodium  40 mg Per Tube QHS  . sodium chloride flush  10-40 mL Intracatheter Q12H  . sodium chloride flush  3 mL Intravenous Q12H   sodium chloride, sodium chloride, alteplase, fentaNYL (SUBLIMAZE) injection, heparin, hydrALAZINE, sodium chloride flush, sodium chloride flush  Assessment/ Plan:  1. Chronic kidney  disease stage IV with a baseline GFR around 19-20. Chronic kidney disease likely on the basis of longstanding hypertension in the setting of a solitary kidney.  2. Acute kidney injury. Renal ultrasound with no evidence of obstruction. She is status post left nephrectomy. Likely ATN in the setting of shock, cardiac arrest, and E. coli sepsis. Minimal urine output. Tolerated dialysis 08/08/2019 with 3 L removed.  Still making some urine 550 cc 08/08/2019.  There is no urgent indication for dialysis.  We will continue to follow to see if she starts diuresing.  It appears the baseline GFR is between 19 and 20 cc/min so she may not be much change from baseline.  3. Status post cardiac arrest.  ROSC 10 minutes 1 dose epinephrine.  Ejection fraction 25 to 30%.  Holding on cardiac catheterization secondary to acute kidney injury.  Underwent bedside TEE 08/09/2019 revealing vegetations on aortic valve  4.Non-anion gap metabolic acidosis. Not issue at this time  5. Hypokalemia.     Repleted  6.  Atrial fibrillation continues on heparin and amiodarone monitoring electrolytes  7.  E. coli UTI/sepsis   continues Ancef 1 g every 24 hours  8. Hypophosphatemia appears replete  9.  Volume/hypertension  she did have some response to IV Lasix although with still minimal urine output will continue to follow today.  10. MSSA bacteremia with endocarditis 6 weeks treatment  11.  Encephalopathy slow to resolve.  Discussed with Dr. Ebony Hail.  Patient having difficulty weaning from ventilator would like [place] to consider CRRT for volume removal.     LOS: [place] '@TODAY' '@9' :18 AM

## 2019-08-11 NOTE — Progress Notes (Signed)
[place] for Heparin Indication: atrial fibrillation  Allergies  Allergen Reactions  . Codeine Other (See Comments)    Reaction not recalled by family- was told to never take this    Patient Measurements: Height: 5' 6" (167.6 cm) Weight: 179 lb 3.7 oz (81.3 kg) IBW/kg (Calculated) : 59.3 Heparin Dosing Weight: 78 kg  Vital Signs: Temp: 98.7 F (37.1 C) (10/30 1156) Temp Source: Oral (10/30 1156) BP: 164/75 (10/30 1400) Pulse Rate: 72 (10/30 1400)  Labs: Recent Labs    08/09/19 0327 08/10/19 0437  08/10/19 2316 08/11/19 0458 08/11/19 0730 08/11/19 1254  HGB 8.0* 8.5*  --   --   --  8.4*  --   HCT 25.6* 27.2*  --   --   --  26.4*  --   PLT 140* 182  --   --   --  186  --   HEPARINUNFRC 0.35 0.23*   < > 0.38 0.26*  --  0.44  CREATININE 1.94* 2.37*  --   --  2.64*  --   --    < > = values in this interval not displayed.    Estimated Creatinine Clearance: 20.7 mL/min (A) (by C-G formula based on SCr of 2.64 mg/dL (H)).   Medical History: Past Medical History:  Diagnosis Date  . Anemia   . Chronic diastolic CHF (congestive heart failure) ([place])   . CKD (chronic kidney disease), stage IV ([place])   . Hypertension   . Persistent atrial fibrillation ([place])    a. dx 01/2019 s/p TEE DCCV.  Marland Kitchen Renal disorder   . Unilateral congenital absence of kidney     Medications:  Scheduled:  . amiodarone  200 mg Per Tube Daily  . ARIPiprazole  10 mg Per Tube BID  . atorvastatin  20 mg Per NG tube Daily  . chlorhexidine gluconate (MEDLINE KIT)  15 mL Mouth Rinse BID  . Chlorhexidine Gluconate Cloth  6 each Topical Daily  . insulin aspart  0-9 Units Subcutaneous Q4H  . mouth rinse  15 mL Mouth Rinse 10 times per day  . pantoprazole sodium  40 mg Per Tube QHS  . sodium chloride flush  10-40 mL Intracatheter Q12H  . sodium chloride flush  3 mL Intravenous Q12H  . valproic acid  1,000 mg Per Tube QHS   Infusions:  .  prismasol BGK 4/2.5 500  mL/hr at 08/11/19 1256  .  prismasol BGK 4/2.5 500 mL/hr at 08/11/19 1256  . sodium chloride 10 mL/hr at 08/08/19 2137  . sodium chloride 20 mL/hr at 08/07/19 0300  . sodium chloride    . sodium chloride    . sodium chloride    .  ceFAZolin (ANCEF) IV    . dexmedetomidine (PRECEDEX) IV infusion Stopped (08/11/19 1140)  . feeding supplement (VITAL AF 1.2 CAL) 1,000 mL (08/11/19 1100)  . heparin 1,600 Units/hr (08/11/19 1400)  . norepinephrine (LEVOPHED) Adult infusion Stopped (08/11/19 1357)  . prismasol BGK 4/2.5 2,000 mL/hr at 08/11/19 1256    Assessment: Pt is a 73 y/o female admitted s/p out of hospital cardiac arrest. ROSC was achieved and targeted temperature management was initiated. Pt has a history of A-fib (taking eliquis PTA), diastolic heart failure, HTN, HDL, IDDM, CKD. Pharmacy has been consulted to dose heparin for A-fib.  Heparin level at goal this morning (0.44), hgb low but stable in the 8s, plt stable 180s. No bleeding issues noted.   Goal of Therapy:  Heparin  level 0.3-0.7 units/ml Monitor platelets by anticoagulation protocol: Yes   Plan:  Continue heparin at 1600 units/hr Daily heparin level, CBC  Erin Hearing PharmD., BCPS Clinical Pharmacist 08/11/2019 2:10 PM

## 2019-08-11 NOTE — Progress Notes (Signed)
Progress Note  Patient Name: Jaclyn Day Date of Encounter: 08/11/2019  Primary Cardiologist: Ena Dawley, MD   Subjective  O/N events: Started to make some urine. 600cc out yesterday.  Jaclyn Day was examined and evaluated at bedside this AM. She was observed sleeping comfortably. She woke to verbal stimuli and is able to follow directions appropriately.  Denies any chest pain or dyspnea.   Inpatient Medications    Scheduled Meds:  amiodarone  200 mg Per Tube Daily   atorvastatin  20 mg Per NG tube Daily   chlorhexidine gluconate (MEDLINE KIT)  15 mL Mouth Rinse BID   Chlorhexidine Gluconate Cloth  6 each Topical Daily   insulin aspart  0-9 Units Subcutaneous Q4H   mouth rinse  15 mL Mouth Rinse 10 times per day   pantoprazole sodium  40 mg Per Tube QHS   sodium chloride flush  10-40 mL Intracatheter Q12H   sodium chloride flush  3 mL Intravenous Q12H   Continuous Infusions:  sodium chloride 10 mL/hr at 08/08/19 2137   sodium chloride 20 mL/hr at 08/07/19 0300   sodium chloride     sodium chloride     sodium chloride      ceFAZolin (ANCEF) IV 1 g (08/11/19 0437)   dexmedetomidine (PRECEDEX) IV infusion 0.3 mcg/kg/hr (08/11/19 0303)   feeding supplement (VITAL AF 1.2 CAL) 55 mL/hr at 08/11/19 0300   heparin 1,450 Units/hr (08/11/19 0300)   PRN Meds: sodium chloride, sodium chloride, alteplase, fentaNYL (SUBLIMAZE) injection, heparin, hydrALAZINE, sodium chloride flush, sodium chloride flush   Vital Signs    Vitals:   08/11/19 0400 08/11/19 0422 08/11/19 0430 08/11/19 0500  BP: (!) 144/59  (!) 123/58 (!) 154/68  Pulse: 88  85 86  Resp: 13  (!) 29 15  Temp:      TempSrc:      SpO2: 99%  98% 98%  Weight:  81.3 kg    Height:        Intake/Output Summary (Last 24 hours) at 08/11/2019 0703 Last data filed at 08/11/2019 0300 Gross per 24 hour  Intake 1699.21 ml  Output 600 ml  Net 1099.21 ml   Filed Weights   08/09/19 0500  08/10/19 0414 08/11/19 0422  Weight: 80.5 kg 79.3 kg 81.3 kg    Telemetry   Normal sinus with PVCs - Personally Reviewed  ECG    No new tracings to review - Personally Reviewed  Physical Exam   Physical Exam  Constitutional: She is well-developed, well-nourished, and in no distress. No distress.  HENT:  Mouth/Throat: Oropharynx is clear and moist.  Intubated  Eyes: Conjunctivae are normal.  Neck: Normal range of motion. Neck supple. No JVD present.  Cardiovascular: Normal rate, regular rhythm, normal heart sounds and intact distal pulses.  No murmur heard. Pulmonary/Chest: Effort normal and breath sounds normal. She has no wheezes. She has no rales.  Abdominal: Soft. Bowel sounds are normal.  Musculoskeletal: Normal range of motion.        General: No edema.  Neurological: She is alert.  Skin: Skin is warm and dry. She is not diaphoretic.   Labs    Chemistry Recent Labs  Lab 08/08/19 0320 08/09/19 0327 08/10/19 0437 08/11/19 0458  NA 136 136 134* 134*  K 3.5 3.7 3.5 4.0  CL 106 103 99 98  CO2 20* '25 24 23  ' GLUCOSE 123* 110* 139* 141*  BUN 61* 34* 41* 49*  CREATININE 2.90* 1.94* 2.37* 2.64*  CALCIUM 8.0* 8.0*  8.1* PENDING  ALBUMIN 1.7* 1.7*  --   --   GFRNONAA 16* 25* 20* 17*  GFRAA 18* 29* 23* 20*  ANIONGAP '10 8 11 13     ' Hematology Recent Labs  Lab 08/08/19 0320 08/09/19 0327 08/10/19 0437  WBC 10.3 13.8* 18.9*  RBC 2.86* 2.78* 2.94*  HGB 8.3* 8.0* 8.5*  HCT 26.7* 25.6* 27.2*  MCV 93.4 92.1 92.5  MCH 29.0 28.8 28.9  MCHC 31.1 31.3 31.3  RDW 16.9* 17.0* 17.2*  PLT 137* 140* 182    Cardiac EnzymesNo results for input(s): TROPONINI in the last 168 hours. No results for input(s): TROPIPOC in the last 168 hours.   BNPNo results for input(s): BNP, PROBNP in the last 168 hours.   DDimer  No results for input(s): DDIMER in the last 168 hours.   Radiology    Dg Chest Port 1 View  Result Date: 08/10/2019 CLINICAL DATA:  Endotracheal tube  adjustment EXAM: PORTABLE CHEST 1 VIEW COMPARISON:  Earlier today at 0500 hours FINDINGS: 0800 hours. Endotracheal tube has been retracted, 2.5 cm above carina. Nasogastric tube extends beyond the inferior aspect of the film. Right internal jugular line tip at low SVC. Mild cardiomegaly. Small bilateral pleural effusions. No pneumothorax. Mild interstitial edema. Left greater than right base airspace disease. IMPRESSION: Retraction of endotracheal tube, now 2.5 cm above carina. Otherwise, similar appearance of congestive heart failure, bilateral pleural effusions, and bibasilar Airspace disease, likely atelectasis. Electronically Signed   By: Abigail Miyamoto M.D.   On: 08/10/2019 08:56   Dg Chest Port 1 View  Result Date: 08/10/2019 CLINICAL DATA:  Endotracheal tube EXAM: PORTABLE CHEST 1 VIEW COMPARISON:  Yesterday FINDINGS: The endotracheal tube tip is at the carina, consider retraction by 3 cm. The orogastric tube reaches the stomach at least. Right IJ line with tip at the SVC. Hazy opacity at both lung bases. There is likely small volume pleural fluid. Large lung volumes. Chronic cardiomegaly. No pneumothorax. IMPRESSION: 1. The endotracheal tube tip is at the carina, suggest retraction by approximately 3 cm. 2. Unchanged airspace opacity at the bases with pleural fluid. Electronically Signed   By: Monte Fantasia M.D.   On: 08/10/2019 06:33   Dg Chest Port 1 View  Result Date: 08/09/2019 CLINICAL DATA:  OG tube placement EXAM: PORTABLE CHEST 1 VIEW COMPARISON:  08/07/2019, 08/06/2019, 08/05/2019 FINDINGS: Endotracheal tube tip is less than a cm superior to the carina. Right IJ central venous catheter tip over the SVC. Esophageal tube tip below the diaphragm but non included. Small left-sided pleural effusion. No significant change in dense airspace disease at left lung base and hazy right lower lung airspace disease. Stable cardiomediastinal silhouette with mild vascular congestion. IMPRESSION: 1.  Endotracheal tube tip less than a cm superior to the carina 2. Esophageal tube tip is below the diaphragm but incompletely visualized 3. Probable small pleural effusions. No significant change in cardiomegaly, vascular congestion and bibasilar airspace disease. Electronically Signed   By: Donavan Foil M.D.   On: 08/09/2019 16:09    Cardiac Studies   TTE 08/03/19  1. Left ventricular ejection fraction, by visual estimation, is 20 to 25%. The left ventricle has severely decreased function. Normal left ventricular size. There is moderately increased left ventricular hypertrophy. Severe global hypokinesis.  2. Left ventricular diastolic function could not be evaluated pattern of LV diastolic filling.  3. Global right ventricle has mildly reduced systolic function.The right ventricular size is normal. No increase in right ventricular wall thickness.  4. Left atrial size was severely dilated.  5. Right atrial size was normal.  6. The mitral valve is abnormal. Mild mitral valve regurgitation.  7. The tricuspid valve is grossly normal. Tricuspid valve regurgitation is trivial.  8. The aortic valve is tricuspid Aortic valve regurgitation was not visualized by color flow Doppler.  9. The pulmonic valve was grossly normal. Pulmonic valve regurgitation is not visualized by color flow Doppler. 10. Normal pulmonary artery systolic pressure. 11. The inferior vena cava is normal in size with <50% respiratory variability, suggesting right atrial pressure of 8 mmHg. (on vent) 12. The interatrial septum was not well visualized.  Patient Profile     Jaclyn Day is a 73 y.o. female with a hx of A.fib on Eliquis, Diastolic heart failure, Htn, HLd, IDDM, CKD 4 w/ hx of nephrectomy admit post-arrest, found to be in A.fib w/ RVR and worsening systolic heart failure  Assessment & Plan  1.  Post Arrest-Care MSSA bacteremia w/ Aortic Valve vegetation E.Coli UTI Presented to [facility] after arrest. Blood culture  showing MSSA bacteremia. Repeat cultures NGTD. Echocardiogram x2 showing reduced EF (25-30%) compared to prior. Bedside TEE showing improved EF w/ aortic valve vegetation - C/w cefazolin - ID following - Holding on Cath due to AKI. Worsening w/ holding dialysis - EP eval in future  2. Atrial Fibrillation Prior hx of A.fib converted to sinus after cardioversion. CHADS-VASC score of 5 due to gender, age, chf, htn, dm. Spontaneous conversion to sinus 08/08/19 - C/w heparin gtt - C/w oral amiodarone 248m daily - C/w telemetry  3. Acute systolic heart failure Prior hx of diastolic heart failure now with new acute systolic heart failure. Echo showing global hypokinesis. Bedside TEE showing recovered EF. Need cath but waiting to see if kidney function will recover w/o dialysis - Awaiting renal recovery - Daily weights, I&Os - C/w telemetry  4. Acute on Chronic Kidney Disease stage 4 Hx of L nephrectomy Baseline creatinine 2.7. Admit creatinine 2.64 -> 3.82 -> 2.8->1.94->2.37->2.64. Improved urine output about 600cc overnight. Nephro planning intermittent HD.  - Trend renal fx - Avoid nephrotoxic meds when able    For questions or updates, please contact CHavanaPlease consult [email or web address] for contact info under Cardiology/STEMI.   Signed, JGilberto Better MD PGY-2, [facility] Pager: [phone] 606010/29/2020, 7:05 AM

## 2019-08-12 DIAGNOSIS — I330 Acute and subacute infective endocarditis: Secondary | ICD-10-CM | POA: Diagnosis not present

## 2019-08-12 DIAGNOSIS — B9561 Methicillin susceptible Staphylococcus aureus infection as the cause of diseases classified elsewhere: Secondary | ICD-10-CM | POA: Diagnosis not present

## 2019-08-12 DIAGNOSIS — I5043 Acute on chronic combined systolic (congestive) and diastolic (congestive) heart failure: Secondary | ICD-10-CM | POA: Diagnosis not present

## 2019-08-12 DIAGNOSIS — I469 Cardiac arrest, cause unspecified: Secondary | ICD-10-CM | POA: Diagnosis not present

## 2019-08-12 DIAGNOSIS — R7881 Bacteremia: Secondary | ICD-10-CM | POA: Diagnosis not present

## 2019-08-12 LAB — RENAL FUNCTION PANEL
Albumin: 1.9 g/dL — ABNORMAL LOW (ref 3.5–5.0)
Albumin: 1.8 g/dL — ABNORMAL LOW (ref 3.5–5.0)
Anion gap: 12 (ref 5–15)
Anion gap: 9 (ref 5–15)
BUN: 26 mg/dL — ABNORMAL HIGH (ref 8–23)
BUN: 15 mg/dL (ref 8–23)
CO2: 25 mmol/L (ref 22–32)
CO2: 27 mmol/L (ref 22–32)
Calcium: 8.5 mg/dL — ABNORMAL LOW (ref 8.9–10.3)
Calcium: 8.1 mg/dL — ABNORMAL LOW (ref 8.9–10.3)
Chloride: 101 mmol/L (ref 98–111)
Chloride: 101 mmol/L (ref 98–111)
Creatinine, Ser: 1.53 mg/dL — ABNORMAL HIGH (ref 0.44–1.00)
Creatinine, Ser: 1.15 mg/dL — ABNORMAL HIGH (ref 0.44–1.00)
GFR calc Af Amer: 39 mL/min — ABNORMAL LOW (ref >60)
GFR calc Af Amer: 55 mL/min — ABNORMAL LOW (ref >60)
GFR calc non Af Amer: 34 mL/min — ABNORMAL LOW (ref >60)
GFR calc non Af Amer: 47 mL/min — ABNORMAL LOW (ref >60)
Glucose, Bld: 120 mg/dL — ABNORMAL HIGH (ref 70–99)
Glucose, Bld: 131 mg/dL — ABNORMAL HIGH (ref 70–99)
Phosphorus: 1.7 mg/dL — ABNORMAL LOW (ref 2.5–4.6)
Phosphorus: 2.4 mg/dL — ABNORMAL LOW (ref 2.5–4.6)
Potassium: 4.2 mmol/L (ref 3.5–5.1)
Potassium: 4.4 mmol/L (ref 3.5–5.1)
Sodium: 138 mmol/L (ref 135–145)
Sodium: 137 mmol/L (ref 135–145)

## 2019-08-12 LAB — CBC
HCT: 28.6 % — ABNORMAL LOW (ref 36.0–46.0)
Hemoglobin: 9.1 g/dL — ABNORMAL LOW (ref 12.0–15.0)
MCH: 28.6 pg (ref 26.0–34.0)
MCHC: 31.8 g/dL (ref 30.0–36.0)
MCV: 89.9 fL (ref 80.0–100.0)
Platelets: 257 10*3/uL (ref 150–400)
RBC: 3.18 MIL/uL — ABNORMAL LOW (ref 3.87–5.11)
RDW: 17.2 % — ABNORMAL HIGH (ref 11.5–15.5)
WBC: 21.6 10*3/uL — ABNORMAL HIGH (ref 4.0–10.5)
nRBC: 0 % (ref 0.0–0.2)

## 2019-08-12 LAB — HEPARIN LEVEL (UNFRACTIONATED)
Heparin Unfractionated: 0.81 IU/mL — ABNORMAL HIGH (ref 0.30–0.70)
Heparin Unfractionated: 0.6 IU/mL (ref 0.30–0.70)
Heparin Unfractionated: 0.82 IU/mL — ABNORMAL HIGH (ref 0.30–0.70)

## 2019-08-12 LAB — GLUCOSE, CAPILLARY
Glucose-Capillary: 108 mg/dL — ABNORMAL HIGH (ref 70–99)
Glucose-Capillary: 128 mg/dL — ABNORMAL HIGH (ref 70–99)
Glucose-Capillary: 127 mg/dL — ABNORMAL HIGH (ref 70–99)
Glucose-Capillary: 88 mg/dL (ref 70–99)
Glucose-Capillary: 121 mg/dL — ABNORMAL HIGH (ref 70–99)
Glucose-Capillary: 133 mg/dL — ABNORMAL HIGH (ref 70–99)

## 2019-08-12 LAB — CULTURE, BLOOD (ROUTINE X 2)
Culture: NO GROWTH
Culture: NO GROWTH

## 2019-08-12 LAB — MAGNESIUM: Magnesium: 2.2 mg/dL (ref 1.7–2.4)

## 2019-08-12 MED ORDER — SODIUM PHOSPHATES 45 MMOLE/15ML IV SOLN: 20.0000 mmol | Freq: Once | INTRAVENOUS | Status: DC

## 2019-08-12 MED ORDER — SODIUM PHOSPHATES 45 MMOLE/15ML IV SOLN
20.0000 mmol | Freq: Once | INTRAVENOUS | Status: AC
  Administered 2019-08-12: 20 mmol via INTRAVENOUS
  Filled 2019-08-12: qty 6.67

## 2019-08-12 NOTE — Plan of Care (Signed)
  Problem: Clinical Measurements: Goal: Ability to maintain clinical measurements within normal limits will improve Outcome: Progressing Goal: Will remain free from infection Outcome: Progressing Goal: Diagnostic test results will improve Outcome: Progressing Goal: Respiratory complications will improve Outcome: Not Progressing Note: Unable to wean on vent today, then episode of sats dropping with activity this evening Goal: Cardiovascular complication will be avoided Outcome: Progressing Note: Remains in SR, occ PACs and PVCs   Problem: Activity: Goal: Risk for activity intolerance will decrease Outcome: Not Progressing Note: Bedrest with q2 turns   Problem: Nutrition: Goal: Adequate nutrition will be maintained Outcome: Progressing   Problem: Coping: Goal: Level of anxiety will decrease Outcome: Not Progressing Note: Pt very anxious at times, although some improvement overall   Problem: Pain Managment: Goal: General experience of comfort will improve Outcome: Progressing   Problem: Safety: Goal: Ability to remain free from injury will improve Outcome: Progressing   Problem: Skin Integrity: Goal: Risk for impaired skin integrity will decrease Outcome: Progressing   Problem: Education: Goal: Ability to manage disease process will improve Outcome: Not Progressing   Problem: Cardiac: Goal: Ability to achieve and maintain adequate cardiopulmonary perfusion will improve Outcome: Progressing   Problem: Neurologic: Goal: Promote progressive neurologic recovery Outcome: Progressing Note: Pt is following commands   Problem: Skin Integrity: Goal: Risk for impaired skin integrity will be minimized. Outcome: Progressing   Problem: Activity: Goal: Ability to tolerate increased activity will improve Outcome: Progressing   Problem: Respiratory: Goal: Ability to maintain a clear airway and adequate ventilation will improve Outcome: Not Progressing   Problem: Role  Relationship: Goal: Method of communication will improve Outcome: Progressing

## 2019-08-12 NOTE — Progress Notes (Signed)
[place] for Heparin Indication: atrial fibrillation  Allergies  Allergen Reactions  . Codeine Other (See Comments)    Reaction not recalled by family- was told to never take this    Patient Measurements: Height: _0  (167.6 cm) Weight: 174 lb 6.1 oz (79.1 kg) IBW/kg (Calculated) : 59.3 Heparin Dosing Weight: 78 kg  Vital Signs: Temp: 99 F (37.2 C) (10/31 1949) Temp Source: Oral (10/31 1949) BP: 143/98 (10/31 2045) Pulse Rate: 83 (10/31 2045)  Labs: Recent Labs    08/10/19 0437  08/11/19 0730  08/11/19 1627 08/12/19 0305 08/12/19 1207 08/12/19 1606 08/12/19 2054  HGB 8.5*  --  8.4*  --   --  9.1*  --   --   --   HCT 27.2*  --  26.4*  --   --  28.6*  --   --   --   PLT 182  --  186  --   --  257  --   --   --   HEPARINUNFRC 0.23*   < >  --    < >  --  0.81* 0.60  --  0.82*  CREATININE 2.37*   < >  --   --  2.13* 1.53*  --  1.15*  --    < > = values in this interval not displayed.    Estimated Creatinine Clearance: 46.9 mL/min (A) (by C-G formula based on SCr of 1.15 mg/dL (H)).   Medical History: Past Medical History:  Diagnosis Date  . Anemia   . Chronic diastolic CHF (congestive heart failure) ([place])   . CKD (chronic kidney disease), stage IV ([place])   . Hypertension   . Persistent atrial fibrillation ([place])    a. dx 01/2019 s/p TEE DCCV.  Marland Kitchen Renal disorder   . Unilateral congenital absence of kidney     Medications:  Scheduled:  . amiodarone  200 mg Per Tube Daily  . ARIPiprazole  10 mg Per Tube BID  . atorvastatin  20 mg Per NG tube Daily  . chlorhexidine gluconate (MEDLINE KIT)  15 mL Mouth Rinse BID  . Chlorhexidine Gluconate Cloth  6 each Topical Daily  . insulin aspart  0-9 Units Subcutaneous Q4H  . mouth rinse  15 mL Mouth Rinse 10 times per day  . pantoprazole sodium  40 mg Per Tube QHS  . sodium chloride flush  10-40 mL Intracatheter Q12H  . sodium chloride flush  3 mL Intravenous Q12H  . Thrombi-Pad  1  each Topical Once  . valproic acid  1,000 mg Per Tube QHS   Infusions:  .  prismasol BGK 4/2.5 500 mL/hr at 08/12/19 2019  .  prismasol BGK 4/2.5 500 mL/hr at 08/12/19 2022  . sodium chloride 10 mL/hr at 08/12/19 1601  . sodium chloride 20 mL/hr at 08/07/19 0300  . sodium chloride    . sodium chloride    . sodium chloride    .  ceFAZolin (ANCEF) IV Stopped (08/12/19 1632)  . dexmedetomidine (PRECEDEX) IV infusion 0.5 mcg/kg/hr (08/12/19 2000)  . feeding supplement (VITAL AF 1.2 CAL) 1,000 mL (08/12/19 0743)  . heparin 1,500 Units/hr (08/12/19 2000)  . norepinephrine (LEVOPHED) Adult infusion 4 mcg/min (08/12/19 2000)  . prismasol BGK 4/2.5 2,000 mL/hr at 08/12/19 2008    Assessment: Pt is a 73 y/o female admitted s/p out of hospital cardiac arrest. ROSC was achieved and targeted temperature management was initiated. Pt has a history of A-fib (taking eliquis  PTA), diastolic heart failure, HTN, HDL, IDDM, CKD. Pharmacy has been consulted to dose heparin for A-fib.  Heparin level this evening came back supratherapeutic at 0.82, on 1500 units/hr. Hgb improved to 9.1, plt stable wnls. No bleeding issues noted- running in L hand, drawn from opposite.   Goal of Therapy:  Heparin level 0.3-0.7 units/ml Monitor platelets by anticoagulation protocol: Yes   Plan:  Decrease heparin at 1350 units/hr Check confirmatory level in 8 hours Daily heparin level, CBC Monitor for bleeding  Antonietta Jewel, PharmD, BCCCP Clinical Pharmacist  Phone: [phone]  Please check AMION for all [place] phone numbers After 10:00 PM, call [place] [phone] 08/12/2019       9:19 PM

## 2019-08-12 NOTE — Progress Notes (Signed)
[facility] ROUNDING NOTE   Subjective:   This is a 73 year old lady with a history of an out-of-hospital cardiac arrest of unclear etiology CPR for 10 minutes epinephrine before return of spontaneous circulation.  She has a history of new onset atrial fibrillation in April 7048 and diastolic dysfunction.  She has a history of chronic kidney disease stage IV status post nephrectomy.   Most recent ejection fraction improved to 45 to 50% on repeat TEE 08/09/2019.  Appreciate assistance from cardiology Dr. Gilberto Better she has a baseline GFR of about 20 cc/min from April 2020.  She was initiated on dialysis 08/06/2019, her last dialysis treatment was 08/08/2019.  She has a right IJ dialysis cath.  Urine cultures were positive for E. coli 08/03/2019. TEE at bedside today is suspicious for vegetation (small filamentous strand on the left coronary cusp). EF has recovered back to 45-50% and now back in sinus rhythm. Will treat for 6 weeks native valve MSSA endocarditis appreciate assistance from [employer] infectious disease.   Appears a little bit more awake 08/12/2019.  CRRT initiated 08/11/2019 to improve volume removal and facilitate weaning.  She is tolerating 200 cc an hour removed.  Urine output 950 cc 08/11/2019  Blood pressure 93/47 pulse 69 temperature 97.8 O2 sats 100% FiO2 30    Sodium 138 potassium 4.2 chloride 101 CO2 25 BUN 26 creatinine 1.53 glucose 120 phosphorus 1.7 calcium 8.5 magnesium 2.2 WBC 21.6 hemoglobin 9.1 platelets 237  Chest x-ray from 08/09/2019 showed probable small pleural effusions no significant change in cardiomegaly vascular congestion with bibasilar airspace disease.   Amiodarone 200 mg daily atorvastatin 20 mg daily, Protonix 40 mg daily, Ancef 1 g every 24 hours IV heparin IV norepinephrine, Abilify 10 mg twice daily, valproic acid 1 g nightly  Objective:  Vital signs in last 24 hours:  Temp:  [97.3 F (36.3 C)-98.7 F (37.1 C)] 97.8 F (36.6  C) (10/31 0807) Pulse Rate:  [38-123] 72 (10/31 0900) Resp:  [13-33] 30 (10/31 0900) BP: (71-176)/(40-95) 148/72 (10/31 0900) SpO2:  [96 %-100 %] 100 % (10/31 0900) FiO2 (%):  [30 %] 30 % (10/31 0859) Weight:  [79.1 kg] 79.1 kg (10/31 0400)  Weight change: -2.2 kg Filed Weights   08/10/19 0414 08/11/19 0422 08/12/19 0400  Weight: 79.3 kg 81.3 kg 79.1 kg    Intake/Output: I/O last 3 completed shifts: In: 3047.7 [I.V.:980.6; Other:2; GQ/BV:6945; IV Piggyback:250.1] Out: 0388 [Urine:950; EKCMK:3491]   Intake/Output this shift:  Total I/O In: 188.6 [I.V.:78.6; NG/GT:110] Out: 278 [Other:278]  General:  Intermittently confused HEENT: MMM [place] AT anicteric sclera Neck:  No JVD, no adenopathy CV:  Heart RRR  Lungs:  L/S CTA bilaterally ventilator supported Abd:  abd SNT/ND with normal BS GU:  Bladder non-palpable Extremities: +1 bilateral LE edema. Skin:  No skin rash   Basic Metabolic Panel: Recent Labs  Lab 08/07/19 0327 08/08/19 0320 08/09/19 0327 08/10/19 0437 08/11/19 0458 08/11/19 1627 08/12/19 0305  NA 138 136 136 134* 134* 135 138  K 3.2* 3.5 3.7 3.5 4.0 3.7 4.2  CL 104 106 103 99 98 97* 101  CO2 20* 20* '25 24 23 24 25  ' GLUCOSE 119* 123* 110* 139* 141* 151* 120*  BUN 43* 61* 34* 41* 49* 40* 26*  CREATININE 2.78* 2.90* 1.94* 2.37* 2.64* 2.13* 1.53*  CALCIUM 7.9* 8.0* 8.0* 8.1* 8.4* 8.5* 8.5*  MG 2.1 1.9 1.7 1.9  --   --  2.2  PHOS 2.6 2.6 2.0* 3.2  --  2.4* 1.7*    Liver Function Tests: Recent Labs  Lab 08/08/19 0320 08/09/19 0327 08/11/19 1627 08/12/19 0305  ALBUMIN 1.7* 1.7* 1.9* 1.9*   No results for input(s): LIPASE, AMYLASE in the last 168 hours. No results for input(s): AMMONIA in the last 168 hours.  CBC: Recent Labs  Lab 08/07/19 0327 08/08/19 0320 08/09/19 0327 08/10/19 0437 08/11/19 0730 08/12/19 0305  WBC 12.5* 10.3 13.8* 18.9* 18.3* 21.6*  NEUTROABS 10.5* 7.5 10.4* 14.6*  --   --   HGB 8.3* 8.3* 8.0* 8.5* 8.4* 9.1*  HCT 26.5*  26.7* 25.6* 27.2* 26.4* 28.6*  MCV 93.6 93.4 92.1 92.5 89.8 89.9  PLT 143* 137* 140* 182 186 257    Cardiac Enzymes: No results for input(s): CKTOTAL, CKMB, CKMBINDEX, TROPONINI in the last 168 hours.  BNP: Invalid input(s): POCBNP  CBG: Recent Labs  Lab 08/11/19 1526 08/11/19 1948 08/11/19 2333 08/12/19 0359 08/12/19 0807  GLUCAP 131* 121* 134* 108* 128*    Microbiology: Results for orders placed or performed during the hospital encounter of 08/03/19  Blood culture (routine x 2)     Status: None   Collection Time: 08/03/19 10:33 AM   Specimen: BLOOD RIGHT HAND  Result Value Ref Range Status   Specimen Description BLOOD RIGHT HAND  Final   Special Requests   Final    BOTTLES DRAWN AEROBIC ONLY Blood Culture results may not be optimal due to an inadequate volume of blood received in culture bottles   Culture   Final    NO GROWTH 5 DAYS Performed at [facility], [place] [address] [place], [place] [address]    Report Status 08/08/2019 FINAL  Final  Blood culture (routine x 2)     Status: Abnormal   Collection Time: 08/03/19 10:48 AM   Specimen: BLOOD  Result Value Ref Range Status   Specimen Description BLOOD LEFT FEMORAL ARTERY  Final   Special Requests   Final    BOTTLES DRAWN AEROBIC AND ANAEROBIC Blood Culture adequate volume   Culture  Setup Time   Final    GRAM POSITIVE COCCI IN CLUSTERS AEROBIC BOTTLE ONLY CRITICAL RESULT CALLED TO, READ BACK BY AND VERIFIED WITH: J. LEDFORD,PHARMD 0207 08/04/2019 Mena Goes Performed at [facility], [place] [address], [place], [place] [address]    Culture STAPHYLOCOCCUS AUREUS (A)  Final   Report Status 08/06/2019 FINAL  Final   Organism ID, Bacteria STAPHYLOCOCCUS AUREUS  Final      Susceptibility   Staphylococcus aureus - MIC*    CIPROFLOXACIN <=0.5 SENSITIVE Sensitive     ERYTHROMYCIN <=0.25 SENSITIVE Sensitive     GENTAMICIN <=0.5 SENSITIVE Sensitive     OXACILLIN <=0.25 SENSITIVE Sensitive     TETRACYCLINE  <=1 SENSITIVE Sensitive     VANCOMYCIN <=0.5 SENSITIVE Sensitive     TRIMETH/SULFA <=10 SENSITIVE Sensitive     CLINDAMYCIN <=0.25 SENSITIVE Sensitive     RIFAMPIN <=0.5 SENSITIVE Sensitive     Inducible Clindamycin NEGATIVE Sensitive     * STAPHYLOCOCCUS AUREUS  SARS Coronavirus 2 by RT PCR (hospital order, performed in [facility] lab) Nasopharyngeal Nasopharyngeal Swab     Status: None   Collection Time: 08/03/19 11:35 AM   Specimen: Nasopharyngeal Swab  Result Value Ref Range Status   SARS Coronavirus 2 NEGATIVE NEGATIVE Final    Comment: (NOTE) If result is NEGATIVE SARS-CoV-2 target nucleic acids are NOT DETECTED. The SARS-CoV-2 RNA is generally detectable in upper and lower  respiratory specimens  during the acute phase of infection. The lowest  concentration of SARS-CoV-2 viral copies this assay can detect is 250  copies / mL. A negative result does not preclude SARS-CoV-2 infection  and should not be used as the sole basis for treatment or other  patient management decisions.  A negative result may occur with  improper specimen collection / handling, submission of specimen other  than nasopharyngeal swab, presence of viral mutation(s) within the  areas targeted by this assay, and inadequate number of viral copies  (<250 copies / mL). A negative result must be combined with clinical  observations, patient history, and epidemiological information. If result is POSITIVE SARS-CoV-2 target nucleic acids are DETECTED. The SARS-CoV-2 RNA is generally detectable in upper and lower  respiratory specimens dur ing the acute phase of infection.  Positive  results are indicative of active infection with SARS-CoV-2.  Clinical  correlation with patient history and other diagnostic information is  necessary to determine patient infection status.  Positive results do  not rule out bacterial infection or co-infection with other viruses. If result is PRESUMPTIVE POSTIVE SARS-CoV-2  nucleic acids MAY BE PRESENT.   A presumptive positive result was obtained on the submitted specimen  and confirmed on repeat testing.  While 2019 novel coronavirus  (SARS-CoV-2) nucleic acids may be present in the submitted sample  additional confirmatory testing may be necessary for epidemiological  and / or clinical management purposes  to differentiate between  SARS-CoV-2 and other Sarbecovirus currently known to infect humans.  If clinically indicated additional testing with an alternate test  methodology [phone]) is advised. The SARS-CoV-2 RNA is generally  detectable in upper and lower respiratory sp ecimens during the acute  phase of infection. The expected result is Negative. Fact Sheet for Patients:  StrictlyIdeas.no Fact Sheet for Healthcare Providers: BankingDealers.co.za This test is not yet approved or cleared by the [place] FDA and has been authorized for detection and/or diagnosis of SARS-CoV-2 by FDA under an Emergency Use Authorization (EUA).  This EUA will remain in effect (meaning this test can be used) for the duration of the COVID-19 declaration under Section 564(b)(1) of the Act, 21 U.S.C. section 360bbb-3(b)(1), unless the authorization is terminated or revoked sooner. Performed at [facility], [place] [address], [place], [place] [address]   Culture, Urine     Status: Abnormal   Collection Time: 08/03/19  1:13 PM   Specimen: Urine, Random  Result Value Ref Range Status   Specimen Description URINE, RANDOM  Final   Special Requests   Final    NONE Performed at [facility], [place] [address], [place], [place] [address]    Culture >=100,000 COLONIES/mL ESCHERICHIA COLI (A)  Final   Report Status 08/05/2019 FINAL  Final   Organism ID, Bacteria ESCHERICHIA COLI (A)  Final      Susceptibility   Escherichia coli - MIC*    AMPICILLIN >=32 RESISTANT Resistant     CEFAZOLIN 8 SENSITIVE Sensitive      CEFTRIAXONE <=1 SENSITIVE Sensitive     CIPROFLOXACIN >=4 RESISTANT Resistant     GENTAMICIN >=16 RESISTANT Resistant     IMIPENEM <=0.25 SENSITIVE Sensitive     NITROFURANTOIN <=16 SENSITIVE Sensitive     TRIMETH/SULFA >=320 RESISTANT Resistant     AMPICILLIN/SULBACTAM 16 INTERMEDIATE Intermediate     PIP/TAZO <=4 SENSITIVE Sensitive     Extended ESBL NEGATIVE Sensitive     * >=100,000 COLONIES/mL ESCHERICHIA COLI  MRSA PCR Screening  Status: None   Collection Time: 08/03/19  2:46 PM   Specimen: Nasopharyngeal  Result Value Ref Range Status   MRSA by PCR NEGATIVE NEGATIVE Final    Comment:        The GeneXpert MRSA Assay (FDA approved for NASAL specimens only), is one component of a comprehensive MRSA colonization surveillance program. It is not intended to diagnose MRSA infection nor to guide or monitor treatment for MRSA infections. Performed at [facility], [place] [address] [place], [place] [address]   Respiratory Panel by PCR     Status: None   Collection Time: 08/03/19  3:56 PM   Specimen: Nasopharyngeal Swab; Respiratory  Result Value Ref Range Status   Adenovirus NOT DETECTED NOT DETECTED Final   Coronavirus 229E NOT DETECTED NOT DETECTED Final    Comment: (NOTE) The Coronavirus on the Respiratory Panel, DOES NOT test for the novel  Coronavirus (2019 nCoV)    Coronavirus HKU1 NOT DETECTED NOT DETECTED Final   Coronavirus NL63 NOT DETECTED NOT DETECTED Final   Coronavirus OC43 NOT DETECTED NOT DETECTED Final   Metapneumovirus NOT DETECTED NOT DETECTED Final   Rhinovirus / Enterovirus NOT DETECTED NOT DETECTED Final   Influenza A NOT DETECTED NOT DETECTED Final   Influenza B NOT DETECTED NOT DETECTED Final   Parainfluenza Virus 1 NOT DETECTED NOT DETECTED Final   Parainfluenza Virus 2 NOT DETECTED NOT DETECTED Final   Parainfluenza Virus 3 NOT DETECTED NOT DETECTED Final   Parainfluenza Virus 4 NOT DETECTED NOT DETECTED Final   Respiratory Syncytial  Virus NOT DETECTED NOT DETECTED Final   Bordetella pertussis NOT DETECTED NOT DETECTED Final   Chlamydophila pneumoniae NOT DETECTED NOT DETECTED Final   Mycoplasma pneumoniae NOT DETECTED NOT DETECTED Final    Comment: Performed at [facility], [place]. [address], [place], [place] [address]  Culture, blood (routine x 2)     Status: None   Collection Time: 08/07/19  6:29 PM   Specimen: BLOOD RIGHT ARM  Result Value Ref Range Status   Specimen Description BLOOD RIGHT ARM  Final   Special Requests   Final    BOTTLES DRAWN AEROBIC ONLY Blood Culture results may not be optimal due to an inadequate volume of blood received in culture bottles   Culture   Final    NO GROWTH 5 DAYS Performed at [facility], [place] [address], [place], [place] [address]    Report Status 08/12/2019 FINAL  Final  Culture, blood (routine x 2)     Status: None   Collection Time: 08/07/19  6:34 PM   Specimen: BLOOD RIGHT ARM  Result Value Ref Range Status   Specimen Description BLOOD RIGHT ARM  Final   Special Requests   Final    BOTTLES DRAWN AEROBIC ONLY Blood Culture results may not be optimal due to an inadequate volume of blood received in culture bottles   Culture   Final    NO GROWTH 5 DAYS Performed at [facility], [place] [address], [place], [place] [address]    Report Status 08/12/2019 FINAL  Final  Culture, blood (routine x 2)     Status: None (Preliminary result)   Collection Time: 08/10/19 10:16 AM   Specimen: BLOOD RIGHT HAND  Result Value Ref Range Status   Specimen Description BLOOD RIGHT HAND  Final   Special Requests AEROBIC BOTTLE ONLY Blood Culture adequate volume  Final   Culture   Final    NO GROWTH  2 DAYS Performed at [facility], [place] [address], [place], [place] [address]    Report Status PENDING  Incomplete  Culture, blood (routine x 2)     Status: None (Preliminary result)   Collection Time: 08/10/19 10:17 AM   Specimen: BLOOD RIGHT HAND  Result Value Ref Range  Status   Specimen Description BLOOD RIGHT HAND  Final   Special Requests AEROBIC BOTTLE ONLY Blood Culture adequate volume  Final   Culture   Final    NO GROWTH 2 DAYS Performed at [facility], [place] [address] [place], [place] [address]    Report Status PENDING  Incomplete    Coagulation Studies: No results for input(s): LABPROT, INR in the last 72 hours.  Urinalysis: No results for input(s): COLORURINE, LABSPEC, PHURINE, GLUCOSEU, HGBUR, BILIRUBINUR, KETONESUR, PROTEINUR, UROBILINOGEN, NITRITE, LEUKOCYTESUR in the last 72 hours.  Invalid input(s): APPERANCEUR    Imaging: No results found.   Medications:   .  prismasol BGK 4/2.5 500 mL/hr at 08/11/19 2332  .  prismasol BGK 4/2.5 500 mL/hr at 08/11/19 2332  . sodium chloride 10 mL/hr at 08/08/19 2137  . sodium chloride 20 mL/hr at 08/07/19 0300  . sodium chloride    . sodium chloride    . sodium chloride    .  ceFAZolin (ANCEF) IV 2 g (08/12/19 0444)  . dexmedetomidine (PRECEDEX) IV infusion 0.5 mcg/kg/hr (08/12/19 0900)  . feeding supplement (VITAL AF 1.2 CAL) 1,000 mL (08/12/19 0743)  . heparin 1,500 Units/hr (08/12/19 0900)  . norepinephrine (LEVOPHED) Adult infusion 4 mcg/min (08/12/19 0900)  . prismasol BGK 4/2.5 2,000 mL/hr at 08/12/19 0713  . sodium phosphate  Dextrose 5% IVPB     . amiodarone  200 mg Per Tube Daily  . ARIPiprazole  10 mg Per Tube BID  . atorvastatin  20 mg Per NG tube Daily  . chlorhexidine gluconate (MEDLINE KIT)  15 mL Mouth Rinse BID  . Chlorhexidine Gluconate Cloth  6 each Topical Daily  . insulin aspart  0-9 Units Subcutaneous Q4H  . mouth rinse  15 mL Mouth Rinse 10 times per day  . pantoprazole sodium  40 mg Per Tube QHS  . sodium chloride flush  10-40 mL Intracatheter Q12H  . sodium chloride flush  3 mL Intravenous Q12H  . Thrombi-Pad  1 each Topical Once  . valproic acid  1,000 mg Per Tube QHS   sodium chloride, sodium chloride, acetaminophen (TYLENOL) oral liquid 160 mg/5  mL, alteplase, fentaNYL (SUBLIMAZE) injection, heparin, heparin, hydrALAZINE, sodium chloride, sodium chloride flush, sodium chloride flush  Assessment/ Plan:  1. Chronic kidney disease stage IV with a baseline GFR around 19-20. Chronic kidney disease likely on the basis of longstanding hypertension in the setting of a solitary kidney.  2. Acute kidney injury. Renal ultrasound with no evidence of obstruction. She is status post left nephrectomy. Likely ATN in the setting of shock, cardiac arrest, and E. coli sepsis. Minimal urine output. Tolerated dialysis 08/08/2019 with 3 L removed.  Still making some urine 550 cc 08/08/2019.  There is no urgent indication for dialysis.  We will continue to follow to see if she starts diuresing.  It appears the baseline GFR is between 19 and 20 cc/min so she may not be much change from baseline.  Restarted CRRT 08/11/2019 for volume removal.  Tolerating 200 cc ultrafiltration an hour.  3. Status post cardiac arrest.  ROSC 10 minutes 1 dose epinephrine.  Ejection fraction 25 to 30%.  Holding on cardiac catheterization secondary to acute kidney injury.  Underwent bedside TEE 08/09/2019 revealing vegetations on aortic valve  4.Non-anion gap metabolic acidosis. Not issue at this time  5. Hypokalemia.     Repleted  6.  Atrial fibrillation continues on heparin and amiodarone monitoring electrolytes  7.  E. coli UTI/sepsis   continues Ancef 1 g every 24 hours  8. Hypophosphatemia will replete  9.  Volume/hypertension patient now transition to CRRT for ultrafiltration better handle on fluid removal  10. MSSA bacteremia with endocarditis 6 weeks treatment  11.  Encephalopathy slow to resolve.      LOS: [place] '@TODAY' '@9' :21 AM

## 2019-08-12 NOTE — Plan of Care (Signed)
  Problem: Activity: Goal: Ability to tolerate increased activity will improve Outcome: Not Progressing   Problem: Respiratory: Goal: Ability to maintain a clear airway and adequate ventilation will improve Outcome: Progressing   

## 2019-08-12 NOTE — Progress Notes (Signed)
[facility] for Heparin Indication: atrial fibrillation  Allergies  Allergen Reactions  . Codeine Other (See Comments)    Reaction not recalled by family- was told to never take this    Patient Measurements: Height: '5\' 6"'  (167.6 cm) Weight: 174 lb 6.1 oz (79.1 kg) IBW/kg (Calculated) : 59.3 Heparin Dosing Weight: 78 kg  Vital Signs: Temp: 97.4 F (36.3 C) (10/31 1140) Temp Source: Axillary (10/31 1140) BP: 114/58 (10/31 1200) Pulse Rate: 54 (10/31 1222)  Labs: Recent Labs    08/10/19 0437  08/11/19 0458 08/11/19 0730 08/11/19 1254 08/11/19 1627 08/12/19 0305 08/12/19 1207  HGB 8.5*  --   --  8.4*  --   --  9.1*  --   HCT 27.2*  --   --  26.4*  --   --  28.6*  --   PLT 182  --   --  186  --   --  257  --   HEPARINUNFRC 0.23*   < > 0.26*  --  0.44  --  0.81* 0.60  CREATININE 2.37*  --  2.64*  --   --  2.13* 1.53*  --    < > = values in this interval not displayed.    Estimated Creatinine Clearance: 35.3 mL/min (A) (by C-G formula based on SCr of 1.53 mg/dL (H)).   Medical History: Past Medical History:  Diagnosis Date  . Anemia   . Chronic diastolic CHF (congestive heart failure) ([place])   . CKD (chronic kidney disease), stage IV ([place])   . Hypertension   . Persistent atrial fibrillation ([place])    a. dx 01/2019 s/p TEE DCCV.  [place] Kitchen Renal disorder   . Unilateral congenital absence of kidney     Medications:  Scheduled:  . amiodarone  200 mg Per Tube Daily  . ARIPiprazole  10 mg Per Tube BID  . atorvastatin  20 mg Per NG tube Daily  . chlorhexidine gluconate (MEDLINE KIT)  15 mL Mouth Rinse BID  . Chlorhexidine Gluconate Cloth  6 each Topical Daily  . insulin aspart  0-9 Units Subcutaneous Q4H  . mouth rinse  15 mL Mouth Rinse 10 times per day  . pantoprazole sodium  40 mg Per Tube QHS  . sodium chloride flush  10-40 mL Intracatheter Q12H  . sodium chloride flush  3 mL Intravenous Q12H  . Thrombi-Pad  1 each Topical Once  .  valproic acid  1,000 mg Per Tube QHS   Infusions:  .  prismasol BGK 4/2.5 500 mL/hr at 08/12/19 0953  .  prismasol BGK 4/2.5 500 mL/hr at 08/12/19 0956  . sodium chloride 10 mL/hr at 08/08/19 2137  . sodium chloride 20 mL/hr at 08/07/19 0300  . sodium chloride    . sodium chloride    . sodium chloride    .  ceFAZolin (ANCEF) IV 2 g (08/12/19 0444)  . dexmedetomidine (PRECEDEX) IV infusion 0.5 mcg/kg/hr (08/12/19 1200)  . feeding supplement (VITAL AF 1.2 CAL) 1,000 mL (08/12/19 0743)  . heparin 1,500 Units/hr (08/12/19 1200)  . norepinephrine (LEVOPHED) Adult infusion 4 mcg/min (08/12/19 1200)  . prismasol BGK 4/2.5 2,000 mL/hr at 08/12/19 1218  . sodium phosphate  Dextrose 5% IVPB 43 mL/hr at 08/12/19 1200    Assessment: Pt is a 73 y/o female admitted s/p out of hospital cardiac arrest. ROSC was achieved and targeted temperature management was initiated. Pt has a history of A-fib (taking eliquis PTA), diastolic heart failure, HTN, HDL,  IDDM, CKD. Pharmacy has been consulted to dose heparin for A-fib.  Heparin level at goal this morning (0.6), hgb improved to 9.1, plt stable wnls. No bleeding issues noted.   Goal of Therapy:  Heparin level 0.3-0.7 units/ml Monitor platelets by anticoagulation protocol: Yes   Plan:  Continue heparin at 1500 units/hr Check confirmatory level in 8 hours Daily heparin level, CBC Monitor for bleeding  Vertis Kelch, PharmD PGY2 Cardiology Pharmacy Resident Phone [phone] 08/12/2019       1:09 PM  Please check [email or web address] for unit-specific pharmacist phone numbers

## 2019-08-12 NOTE — Progress Notes (Signed)
ANTICOAGULATION CONSULT NOTE - Follow Up Consult  Pharmacy Consult for heparin Indication: atrial fibrillation  Labs: Recent Labs    08/10/19 0437  08/11/19 0458 08/11/19 0730 08/11/19 1254 08/11/19 1627 08/12/19 0305  HGB 8.5*  --   --  8.4*  --   --  9.1*  HCT 27.2*  --   --  26.4*  --   --  28.6*  PLT 182  --   --  186  --   --  257  HEPARINUNFRC 0.23*   < > 0.26*  --  0.44  --  0.81*  CREATININE 2.37*  --  2.64*  --   --  2.13*  --    < > = values in this interval not displayed.    Assessment: 73yo female supratherapeutic on heparin after one level at goal; no gtt issues or signs of bleeding per RN.  Goal of Therapy:  Heparin level 0.3-0.7 units/ml   Plan:  Will decrease heparin gtt by 1-22 units/kg/hr to 1500 units/hr and check level in 8 hours.    Wynona Neat, PharmD, BCPS  08/12/2019,4:13 AM

## 2019-08-12 NOTE — Progress Notes (Signed)
Pt began to desating and cyanotic with turning for assessment of Flexiseal and pericare, turned back and head raised but patient remained with low sats and cyanotic, pt ventilated with Ambubag and 100%, Resp called, elink RN and MD on camera; color and sats returned after 1-2 minutes of bagging, RT present and helping to bag/return patient to ventilator

## 2019-08-12 NOTE — Progress Notes (Signed)
NAME:  Jaclyn Day, MRN:  [id number], DOB:  08/13/1946, LOS: 9 ADMISSION DATE:  08/03/2019, CONSULTATION DATE:  10/22 REFERRING MD:  Dr. Sherril Croon, CHIEF COMPLAINT:  Post arrest    Brief History   73yo female admitted after out of hospital cardiac arrest, unclear etiology CPR started by fire 10 min CPR with 1 Epi before ROSC. PCCM called for admission and need for targeted temperature management and ventilator management.   Of note, admitted at this facility 01/25/2019-01/31/2019 for new onset A-fib with development of diastolic congestive heart failure, during admission she underwent TEE and cardioversion with transition to SR.   Past Medical History  Congenital unilateral kidney  CKD stage IV (baseline creatinine 2.2-2.8) w/hx of nephrectomy  Hypertension  Persistent A-fib on anticoagulation/ Eliquis Chronic diastolic CHF Anemia  Cardioversion and TEE 01/31/2019 Mood disorder  Significant Hospital Events   10/22 Admitted post arrest, intubated, initiation of targeted temperature management 36 celsius   10/23 + 800 ml I/O, TTM, Weaned off Levophed overnight, ECHO: LVEF: 20-25%, global hypokinesis, RV midly reduced function with normal size, mild MR, normal pulmonary pressures.  Lactate cleared  10/24 - 40% fio2, Oliguric v Anuric. On amio gtt, heparin gtt, neo gtt . Being weaned off neo gtt. Rewarmed to 37. Non purposeful only (not on sedation). Growing e colii in urine from admission  10/25 - staph identified in blood and vanc started yesterday. Still with poor Ur OP. Foley leak suspected and foley stopped. No new foley . RT says patient followed commands and wants to do SBT. On amio gtt. Off leveophed an dneo. On Heparin gtt.  iHD w/ 1L off   10/26-  following simple commands, placed on precedex for agitation, febrile- re-cultured and femoral CVL discontinued   10/28- converted to NSR, TEE   10/30 - CRRT for volume removal  Consults:  PCCM ID Cards  Procedures:  ETT 10/22  >> R radial Aline 10/22 >> 10/27 Right femoral CVC 10/22 >>10/26 R IJ trialysis 10/25 >>  Significant Diagnostic Tests:  10/22:  ECHO: LVEF: 20-25%, global hypokinesis, RV midly reduced function with normal size, mild MR, normal pulmonary pressures.  10/22: CTH without acute intracranial pathology  [place] 10/23 - IMPRESSION: 1. Status post left nephrectomy 2. Echogenic right kidney with cortical thinning consistent with medical renal disease. No hydronephrosis. Cysts, fewer than 10 in the right kidney. 3. Right pleural effusion  10/26 limited TTE >> EF 25-30%, mod reduced RV function  10/29 TEE - EF 55-60%, severely dilated left atrium, small filamentous structure on aortic valve, suggestive of vegetation  Micro Data:  Blood culture 10/22 >> staph nos 1/4 COVID 10/22 >> Negative RPP 10/22 >> Negative Urine 10/22 - E colii 10/26 BCx 2 >> pending  Antimicrobials:  Rocephin 10/22 >> 10/25 Vanc (staph in blood ) 10/24  Cefazolin 10/25 >>  Interim history/subjective:  On PSV 15/5. Will develop tachypnea with less support. Started on CRRT for volume removal   Objective   Blood pressure (!) 111/58, pulse (!) 58, temperature (!) 97.3 F (36.3 C), temperature source Oral, resp. rate (!) 24, height 5\' 6"  (1.676 m), weight 79.1 kg, SpO2 100 %.    Vent Mode: CPAP;PSV FiO2 (%):  [30 %] 30 % Set Rate:  [20 bmp] 20 bmp Vt Set:  [470 mL] 470 mL PEEP:  [5 cmH20] 5 cmH20 Pressure Support:  [15 cmH20] 15 cmH20 Plateau Pressure:  [16 cmH20-20 cmH20] 20 cmH20   Intake/Output Summary (Last 24 hours) at 08/12/2019 0815  Last data filed at 08/12/2019 0800 Gross per 24 hour  Intake 2227.21 ml  Output 5761 ml  Net -3533.79 ml   Filed Weights   08/10/19 0414 08/11/19 0422 08/12/19 0400  Weight: 79.3 kg 81.3 kg 79.1 kg   Physical Exam: General: Elderly female laying in bed, chronically-ill appearing, no acute distress HENT: [place], AT, ETT in place Eyes: EOMI, no scleral icterus Respiratory:  Diminished breath sounds bilaterally. No crackles, wheezing or rales Cardiovascular: RRR, -M/R/G, no JVD GI: BS+, soft, nontender Extremities:-Edema,-tenderness Neuro: Awake, alert, follows commands GU: [place] in place  Resolved Hospital Problem list    Assessment & Plan:  This is a 73 yo F being admitted to ICU for witnessed out of hospital cardiac arrest, unclear etiology, with field ROSC, resultant respiratory failure and encephalopathy s/p TTM at 36 degrees celsius found to have E. Coli UTI and 1/4+ stap aureus on blood cultures.  Agitation/anxiety requiring sedation P:  Precedex  Continue home depakote and abilify Prn fentanyl, avoid benzo's, RASS goal 0-/1  Acute hypoxic respiratory failure due to cardiac arrest, pulmonary edema  Concerned that her respiratory status is still limited by pulmonary edema P: Full vent support with PS as tolerated CRRT for volume removal. Appreciate Nephrology support. Consider trial extubation tomorrow or Monday    Staph aureus bacteremia secondary endocarditis  E colii UTI  Rising WBC curve, unclear etiology. Cultures remain negative  P: Appreciate ID input. Consider right foot XR when stable Continue cefazolin. End date six weeks from last negative culture 08/07/19 - 09/18/19 Follow-up 10/29 cultures to ensure negative Trend fever / WBC curve. Continue to monitor leukocytosis for now  Out of hospital cardiac arrest s/p TTM Suspect this was precipitated by E Colii UTI +/- staph aureus bacteremia P:  Telemetry Goal MAP > 65 Goal K and Mg, 4 and 2 respectively  Acute systolic heart failure (baseline normal EF in April 2020) -ECHO 08/03/2019: LVEF: 20-25%, global hypokinesis, RV midly reduced function with normal size, mild MR, normal pulmonary pressures.  -TTE 10/26 shows EF 20-25% -TEE EF recovered 55-60% P:  [facility] input. Cath deferred due to AKI  No BB/ ACEi Volume removal per iHD  PAF  Currently in NSR P:   Cards following Amiodarone gtt per cards Continue heparin gtt  Acute on chronic CKD IV, likely ATN component given cardiac arrest Congential Unilateral Kidney   - remains oliguric, renal US without evidence of obstruction  - started on iHD 10/25, 10/27 P:   Dialysis per Nephrology  Electrolyte replacement per HD  Insert foley catheter for strict I&Os  Trend BMP / phos/ mag  Hx of mood disorder P:  Continue home depakote and abilify Holding home cymbalta  Hypophosphatemia P: Replete  Best practice:  Diet:  TF Pain/Anxiety/Delirium protocol (if indicated): prn fentanyl  VAP protocol (if indicated): Yes DVT prophylaxis: Heparin gtt  GI prophylaxis: PPI  Glucose control: SSI Q4H - remains controlled Mobility: Bed rest  Code Status: FULL  Family Communication: longterm boyfriend (states HPOA as well) updated 10/31. pts sister approves giving information and making decisions to him Disposition: ICU   The patient is critically ill with multiple organ systems failure and requires high complexity decision making for assessment and support, frequent evaluation and titration of therapies, application of advanced monitoring technologies and extensive interpretation of multiple databases.   Critical Care Time devoted to patient care services described in this note is 33 Minutes.   Rodman Pickle, M.D. [facility] Pulmonary/Critical Care Medicine 08/12/2019 8:17  AM  Pager: [phone] After hours pager: [phone]

## 2019-08-12 NOTE — Progress Notes (Signed)
Progress Note  Patient Name: Jaclyn Day Date of Encounter: 08/12/2019  Primary Cardiologist: Ena Dawley, MD   Subjective   Remains on ventilator support. Some confusion.  Inpatient Medications    Scheduled Meds: . amiodarone  200 mg Per Tube Daily  . ARIPiprazole  10 mg Per Tube BID  . atorvastatin  20 mg Per NG tube Daily  . chlorhexidine gluconate (MEDLINE KIT)  15 mL Mouth Rinse BID  . Chlorhexidine Gluconate Cloth  6 each Topical Daily  . insulin aspart  0-9 Units Subcutaneous Q4H  . mouth rinse  15 mL Mouth Rinse 10 times per day  . pantoprazole sodium  40 mg Per Tube QHS  . sodium chloride flush  10-40 mL Intracatheter Q12H  . sodium chloride flush  3 mL Intravenous Q12H  . Thrombi-Pad  1 each Topical Once  . valproic acid  1,000 mg Per Tube QHS   Continuous Infusions: .  prismasol BGK 4/2.5 500 mL/hr at 08/11/19 2332  .  prismasol BGK 4/2.5 500 mL/hr at 08/11/19 2332  . sodium chloride 10 mL/hr at 08/08/19 2137  . sodium chloride 20 mL/hr at 08/07/19 0300  . sodium chloride    . sodium chloride    . sodium chloride    .  ceFAZolin (ANCEF) IV 2 g (08/12/19 0444)  . dexmedetomidine (PRECEDEX) IV infusion 0.4 mcg/kg/hr (08/12/19 0055)  . feeding supplement (VITAL AF 1.2 CAL) 55 mL/hr at 08/11/19 1600  . heparin 1,500 Units/hr (08/12/19 2919)  . norepinephrine (LEVOPHED) Adult infusion 5 mcg/min (08/12/19 0622)  . prismasol BGK 4/2.5 2,000 mL/hr at 08/12/19 0713   PRN Meds: sodium chloride, sodium chloride, acetaminophen (TYLENOL) oral liquid 160 mg/5 mL, alteplase, fentaNYL (SUBLIMAZE) injection, heparin, heparin, hydrALAZINE, sodium chloride, sodium chloride flush, sodium chloride flush   Vital Signs    Vitals:   08/12/19 0407 08/12/19 0500 08/12/19 0600 08/12/19 0700  BP: 126/65 116/62 124/61 (!) 109/58  Pulse: 73 (!) 52 (!) 51 (!) 52  Resp: (!) _0 Temp:      TempSrc:      SpO2:  100% 100% 100%  Weight:      Height:         Intake/Output Summary (Last 24 hours) at 08/12/2019 0720 Last data filed at 08/12/2019 0700 Gross per 24 hour  Intake 2202.12 ml  Output 5483 ml  Net -3280.88 ml   Last 3 Weights 08/12/2019 08/11/2019 08/10/2019  Weight (lbs) 174 lb 6.1 oz 179 lb 3.7 oz 174 lb 13.2 oz  Weight (kg) 79.1 kg 81.3 kg 79.3 kg      Telemetry    nsr/sb - Personally Reviewed  ECG    noen - Personally Reviewed  Physical Exam   GEN: No acute distress with some confusion.   Neck: No JVD Cardiac: RRR, no murmurs, rubs, or gallops.  Respiratory: Clear to auscultation bilaterally. GI: Soft, nontender, non-distended  MS: No edema; No deformity. Neuro:  Nonfocal  Psych: Normal affect   Labs    High Sensitivity Troponin:   Recent Labs  Lab 08/03/19 1013 08/03/19 1147 08/03/19 1550 08/04/19 0934  TROPONINIHS 48* 715* 2,793* 3,543*      Chemistry Recent Labs  Lab 08/09/19 0327  08/11/19 0458 08/11/19 1627 08/12/19 0305  NA 136   < > 134* 135 138  K 3.7   < > 4.0 3.7 4.2  CL 103   < > 98 97* 101  CO2 25   < > 23  24 25  GLUCOSE 110*   < > 141* 151* 120*  BUN 34*   < > 49* 40* 26*  CREATININE 1.94*   < > 2.64* 2.13* 1.53*  CALCIUM 8.0*   < > 8.4* 8.5* 8.5*  ALBUMIN 1.7*  --   --  1.9* 1.9*  GFRNONAA 25*   < > 17* 23* 34*  GFRAA 29*   < > 20* 26* 39*  ANIONGAP 8   < > _0 < > = values in this interval not displayed.     Hematology Recent Labs  Lab 08/10/19 0437 08/11/19 0730 08/12/19 0305  WBC 18.9* 18.3* 21.6*  RBC 2.94* 2.94* 3.18*  HGB 8.5* 8.4* 9.1*  HCT 27.2* 26.4* 28.6*  MCV 92.5 89.8 89.9  MCH 28.9 28.6 28.6  MCHC 31.3 31.8 31.8  RDW 17.2* 17.1* 17.2*  PLT 182 186 257    BNPNo results for input(s): BNP, PROBNP in the last 168 hours.   DDimer No results for input(s): DDIMER in the last 168 hours.   Radiology    Dg Chest Port 1 View  Result Date: 08/10/2019 CLINICAL DATA:  Endotracheal tube adjustment EXAM: PORTABLE CHEST 1 VIEW COMPARISON:  Earlier  today at 0500 hours FINDINGS: 0800 hours. Endotracheal tube has been retracted, 2.5 cm above carina. Nasogastric tube extends beyond the inferior aspect of the film. Right internal jugular line tip at low SVC. Mild cardiomegaly. Small bilateral pleural effusions. No pneumothorax. Mild interstitial edema. Left greater than right base airspace disease. IMPRESSION: Retraction of endotracheal tube, now 2.5 cm above carina. Otherwise, similar appearance of congestive heart failure, bilateral pleural effusions, and bibasilar Airspace disease, likely atelectasis. Electronically Signed   By: Abigail Miyamoto M.D.   On: 08/10/2019 08:56    Cardiac Studies   TTE 08/07/19 1. Left ventricular ejection fraction, by visual estimation, is 25 to 30%. The left ventricle has severely decreased function. Normal left ventricular size. There is no left ventricular hypertrophy. 2. Global right ventricle has moderately reduced systolic function.The right ventricular size is moderately enlarged. No increase in right ventricular wall thickness. 3. Left atrial size was normal. 4. Right atrial size was moderately dilated. 5. The mitral valve is normal in structure. Mild mitral valve regurgitation. 6. The tricuspid valve is normal in structure. Tricuspid valve regurgitation moderate. 7. The aortic valve is tricuspid Aortic valve regurgitation was not visualized by color flow Doppler. Mild aortic valve sclerosis without stenosis. 8. The pulmonic valve was not well visualized. Pulmonic valve regurgitation was not assessed by color flow Doppler. 9. Moderately elevated pulmonary artery systolic pressure. 10. The inferior vena cava is dilated in size with <50% respiratory variability, suggesting right atrial pressure of 15 mmHg.  TEE: 10/28  1. LEFT VENTRICLE: The left ventricular wall thickness is moderately increased.  The left ventricular cavity is normal in size. Wall motion is normal.  LVEF is 55-60%. Possible mild  anterior hypokinesis.  2. RIGHT VENTRICLE:  The right ventricle is normal in structure and function without any thrombus or masses.    3. LEFT ATRIUM:  The left atrium is severely dilated in size without any thrombus or masses.  There is not spontaneous echo contrast ("smoke") in the left atrium consistent with a low flow state.  4. LEFT ATRIAL APPENDAGE:  The large, chickenwing appearing left atrial appendage is free of any thrombus or masses. The appendage has single lobes. Pulse doppler indicates moderate flow in the appendage.  5. ATRIAL SEPTUM:  The  atrial septum appears intact and is free of thrombus and/or masses.  There is no evidence for interatrial shunting by color doppler and saline microbubble.  6. RIGHT ATRIUM:  The right atrium is normal in size and function without any thrombus or masses.  7. MITRAL VALVE:  The mitral valve is normal in structure and function with Mild regurgitation.  There were no vegetations or stenosis.  8. AORTIC VALVE:  The aortic valve is trileaflet. There appears to be a small, filamentous structure seen oscillating on the aortic side of the LCC, suggestive of vegetation. Lambl's excrescence (which are normal small mobile frons are typically on several leaflets, so this is less likely the cause), with trivial regurgitation.  There was no stenosis  9. TRICUSPID VALVE:  The tricuspid valve is normal in structure and function with Mild regurgitation.  There were no vegetations or stenosis  10.  PULMONIC VALVE:  The pulmonic valve is normal in structure and function with no regurgitation.  There were no vegetations or stenosis.   11. AORTIC ARCH, ASCENDING AND DESCENDING AORTA:  There was grade 1 Ron Parker et. Al, 1992) atherosclerosis of the ascending aorta, aortic arch, or proximal descending aorta.  12. PULMONARY VEINS: Anomalous pulmonary venous return was not noted.  13. PERICARDIUM: The pericardium appeared normal and non-thickened.  There is  no pericardial effusion. A left pleural effusion is noted.       Patient Profile     Elaina Cara Buntingis a 73 y.o.femalewith a hx of A.fib on Eliquis, Diastolic heart failure, Htn, HLd, IDDM, CKD4 w/ hx of nephrectomy admit post-arrest, found to be in A.fib w/ RVR and worsening systolic heart failure  Assessment & Plan   1 s/p Cardiac arrest - this appears to be a respiratory arrest. I see no evidence of VF  2  Acute on chronic systolic CHF  LVEF 20 to 95%    2  Atrial fibrillation - she is back in NSR and her rates are good. We will follow.  3  Endocarditis.  Probable small AV vegetation on TEE   On ABX for endocarditis    4  ESRD - her kidney function is improving.   For questions or updates, please contact [place] Please consult [email or web address] for contact info under   Signed, Cristopher Peru, MD  08/12/2019, 7:20 AM

## 2019-08-13 ENCOUNTER — Inpatient Hospital Stay (HOSPITAL_COMMUNITY): Payer: 59

## 2019-08-13 DIAGNOSIS — R7881 Bacteremia: Secondary | ICD-10-CM | POA: Diagnosis not present

## 2019-08-13 DIAGNOSIS — J9601 Acute respiratory failure with hypoxia: Secondary | ICD-10-CM | POA: Diagnosis not present

## 2019-08-13 DIAGNOSIS — N179 Acute kidney failure, unspecified: Secondary | ICD-10-CM | POA: Diagnosis not present

## 2019-08-13 DIAGNOSIS — B9561 Methicillin susceptible Staphylococcus aureus infection as the cause of diseases classified elsewhere: Secondary | ICD-10-CM | POA: Diagnosis not present

## 2019-08-13 DIAGNOSIS — I469 Cardiac arrest, cause unspecified: Secondary | ICD-10-CM | POA: Diagnosis not present

## 2019-08-13 DIAGNOSIS — Z452 Encounter for adjustment and management of vascular access device: Secondary | ICD-10-CM

## 2019-08-13 LAB — POCT I-STAT 7, (LYTES, BLD GAS, ICA,H+H)
Acid-Base Excess: 3 mmol/L — ABNORMAL HIGH (ref 0.0–2.0)
Bicarbonate: 26.1 mmol/L (ref 20.0–28.0)
Calcium, Ion: 1.21 mmol/L (ref 1.15–1.40)
HCT: 25 % — ABNORMAL LOW (ref 36.0–46.0)
Hemoglobin: 8.5 g/dL — ABNORMAL LOW (ref 12.0–15.0)
O2 Saturation: 94 %
Potassium: 4.7 mmol/L (ref 3.5–5.1)
Sodium: 135 mmol/L (ref 135–145)
TCO2: 27 mmol/L (ref 22–32)
pCO2 arterial: 35 mmHg (ref 32.0–48.0)
pH, Arterial: 7.48 — ABNORMAL HIGH (ref 7.350–7.450)
pO2, Arterial: 66 mmHg — ABNORMAL LOW (ref 83.0–108.0)

## 2019-08-13 LAB — GLUCOSE, CAPILLARY
Glucose-Capillary: 102 mg/dL — ABNORMAL HIGH (ref 70–99)
Glucose-Capillary: 107 mg/dL — ABNORMAL HIGH (ref 70–99)
Glucose-Capillary: 112 mg/dL — ABNORMAL HIGH (ref 70–99)
Glucose-Capillary: 115 mg/dL — ABNORMAL HIGH (ref 70–99)
Glucose-Capillary: 118 mg/dL — ABNORMAL HIGH (ref 70–99)
Glucose-Capillary: 125 mg/dL — ABNORMAL HIGH (ref 70–99)

## 2019-08-13 LAB — CBC
HCT: 29.9 % — ABNORMAL LOW (ref 36.0–46.0)
Hemoglobin: 9.1 g/dL — ABNORMAL LOW (ref 12.0–15.0)
MCH: 28.3 pg (ref 26.0–34.0)
MCHC: 30.4 g/dL (ref 30.0–36.0)
MCV: 93.1 fL (ref 80.0–100.0)
Platelets: 251 10*3/uL (ref 150–400)
RBC: 3.21 MIL/uL — ABNORMAL LOW (ref 3.87–5.11)
RDW: 17.4 % — ABNORMAL HIGH (ref 11.5–15.5)
WBC: 29 10*3/uL — ABNORMAL HIGH (ref 4.0–10.5)
nRBC: 0 % (ref 0.0–0.2)

## 2019-08-13 LAB — RENAL FUNCTION PANEL
Albumin: 2.1 g/dL — ABNORMAL LOW (ref 3.5–5.0)
Anion gap: 10 (ref 5–15)
BUN: 10 mg/dL (ref 8–23)
CO2: 26 mmol/L (ref 22–32)
Calcium: 8.7 mg/dL — ABNORMAL LOW (ref 8.9–10.3)
Chloride: 100 mmol/L (ref 98–111)
Creatinine, Ser: 0.9 mg/dL (ref 0.44–1.00)
GFR calc Af Amer: >60 mL/min (ref >60)
GFR calc non Af Amer: >60 mL/min (ref >60)
Glucose, Bld: 137 mg/dL — ABNORMAL HIGH (ref 70–99)
Phosphorus: 1.5 mg/dL — ABNORMAL LOW (ref 2.5–4.6)
Potassium: 4.3 mmol/L (ref 3.5–5.1)
Sodium: 136 mmol/L (ref 135–145)

## 2019-08-13 LAB — BASIC METABOLIC PANEL
Anion gap: 10 (ref 5–15)
BUN: 11 mg/dL (ref 8–23)
CO2: 27 mmol/L (ref 22–32)
Calcium: 8.6 mg/dL — ABNORMAL LOW (ref 8.9–10.3)
Chloride: 100 mmol/L (ref 98–111)
Creatinine, Ser: 0.79 mg/dL (ref 0.44–1.00)
GFR calc Af Amer: >60 mL/min (ref >60)
GFR calc non Af Amer: >60 mL/min (ref >60)
Glucose, Bld: 138 mg/dL — ABNORMAL HIGH (ref 70–99)
Potassium: 4.3 mmol/L (ref 3.5–5.1)
Sodium: 137 mmol/L (ref 135–145)

## 2019-08-13 LAB — MAGNESIUM: Magnesium: 2.3 mg/dL (ref 1.7–2.4)

## 2019-08-13 LAB — HEPARIN LEVEL (UNFRACTIONATED)
Heparin Unfractionated: 0.76 IU/mL — ABNORMAL HIGH (ref 0.30–0.70)
Heparin Unfractionated: 0.47 IU/mL (ref 0.30–0.70)

## 2019-08-13 MED ORDER — SODIUM PHOSPHATES 45 MMOLE/15ML IV SOLN
20.0000 mmol | Freq: Once | INTRAVENOUS | Status: DC
  Filled 2019-08-13: qty 6.67

## 2019-08-13 MED ORDER — SODIUM PHOSPHATES 45 MMOLE/15ML IV SOLN
20.0000 mmol | Freq: Once | INTRAVENOUS | Status: AC
  Administered 2019-08-13: 10:00:00 20 mmol via INTRAVENOUS
  Filled 2019-08-13: qty 6.67

## 2019-08-13 MED ORDER — CHLORHEXIDINE GLUCONATE CLOTH 2 % EX PADS
6.0000 | MEDICATED_PAD | Freq: Every day | CUTANEOUS | Status: DC
  Administered 2019-08-13 – 2019-08-16 (×4): 6 via TOPICAL

## 2019-08-13 MED ORDER — LORAZEPAM 2 MG/ML IJ SOLN
0.2500 mg | Freq: Once | INTRAMUSCULAR | Status: AC
  Administered 2019-08-13: 0.25 mg via INTRAVENOUS

## 2019-08-13 MED ORDER — LACTATED RINGERS IV BOLUS
500.0000 mL | Freq: Once | INTRAVENOUS | Status: AC
  Administered 2019-08-13: 500 mL via INTRAVENOUS

## 2019-08-13 MED ORDER — VALPROATE SODIUM 500 MG/5ML IV SOLN
250.0000 mg | Freq: Four times a day (QID) | INTRAVENOUS | Status: DC
  Administered 2019-08-14 – 2019-08-27 (×51): 250 mg via INTRAVENOUS
  Filled 2019-08-13 (×57): qty 2.5

## 2019-08-13 MED ORDER — LORAZEPAM 2 MG/ML IJ SOLN
INTRAMUSCULAR | Status: AC
  Filled 2019-08-13: qty 1

## 2019-08-13 MED ORDER — PANTOPRAZOLE SODIUM 40 MG IV SOLR
40.0000 mg | INTRAVENOUS | Status: DC
  Administered 2019-08-14 – 2019-08-27 (×14): 40 mg via INTRAVENOUS
  Filled 2019-08-13 (×14): qty 40

## 2019-08-13 NOTE — Progress Notes (Signed)
[place] for Heparin Indication: atrial fibrillation  Allergies  Allergen Reactions  . Codeine Other (See Comments)    Reaction not recalled by family- was told to never take this    Patient Measurements: Height: 5\' 6"  (167.6 cm) Weight: 165 lb 12.6 oz (75.2 kg) IBW/kg (Calculated) : 59.3 Heparin Dosing Weight: 78 kg  Vital Signs: Temp: 98.9 F (37.2 C) (11/01 1546) Temp Source: Oral (11/01 1546) BP: 134/58 (11/01 1715) Pulse Rate: 82 (11/01 1715)  Labs: Recent Labs    08/11/19 0730  08/12/19 0305  08/12/19 1606 08/12/19 2054 08/13/19 0531 08/13/19 1652  HGB 8.4*  --  9.1*  --   --   --  9.1*  --   HCT 26.4*  --  28.6*  --   --   --  29.9*  --   PLT 186  --  257  --   --   --  251  --   HEPARINUNFRC  --    < > 0.81*   < >  --  0.82* 0.76* 0.47  CREATININE  --    < > 1.53*  --  1.15*  --  0.79  0.90  --    < > = values in this interval not displayed.    Estimated Creatinine Clearance: 65.9 mL/min (by C-G formula based on SCr of 0.79 mg/dL).  Assessment: Pt is a 73 y/o female admitted s/p out of hospital cardiac arrest. ROSC was achieved and targeted temperature management was initiated. Pt has a history of A-fib (taking eliquis PTA), diastolic heart failure, HTN, HDL, IDDM, CKD. Pharmacy has been consulted to dose heparin for A-fib.  Heparin level is therapeutic; no bleeding reported.  Goal of Therapy:  Heparin level 0.3-0.7 units/ml Monitor platelets by anticoagulation protocol: Yes   Plan:  Continue heparin gtt at 1250 units/hr F/U AM labs  Lorin Hauck D. Mina Marble, PharmD, BCPS, [place] 08/13/2019, 5:50 PM

## 2019-08-13 NOTE — Progress Notes (Signed)
RT NOTE: MD asked RT to patient bedside to try patient on [place] rather than bipap due to anxiety and patient still having a high RR. Patient is currently on 40% and 15L. Patient is tolerating well and seems more comfortable on HHFNC. RT will continue to monitor.

## 2019-08-13 NOTE — Progress Notes (Signed)
RT NOTE: RT placed patient on bipap through servo per MD with RN, due to high RR and shallow breathing. Patient tolerating well, RR in the 40's and MD aware. RT will continue to monitor.

## 2019-08-13 NOTE — Progress Notes (Signed)
[facility] ROUNDING NOTE   Subjective:   This is a 73 year old lady with a history of an out-of-hospital cardiac arrest of unclear etiology CPR for 10 minutes epinephrine before return of spontaneous circulation.  She has a history of new onset atrial fibrillation in April 0102 and diastolic dysfunction.  She has a history of chronic kidney disease stage IV status post nephrectomy.   Most recent ejection fraction improved to 45 to 50% on repeat TEE 08/09/2019.  Appreciate assistance from cardiology Dr. Gilberto Better she has a baseline GFR of about 20 cc/min from April 2020.  She was initiated on dialysis 08/06/2019, her last dialysis treatment was 08/08/2019.  She has a right IJ dialysis cath.  Urine cultures were positive for E. coli 08/03/2019. TEE at bedside today is suspicious for vegetation (small filamentous strand on the left coronary cusp). EF has recovered back to 45-50% and now back in sinus rhythm. Will treat for 6 weeks native valve MSSA endocarditis appreciate assistance from [employer] infectious disease.   Appears a little bit more awake 08/12/2019.  CRRT initiated 08/11/2019 to improve volume removal and facilitate weaning.  She is tolerating 300 cc an hour removed.  Net ultrafiltration 5 L 08/12/2019.  Dwindling urine output.  Patient is much more awake and alert  Blood pressure 140/76 pulse 88 temperature 97.4 O2 sats 100% FiO2 30%  IV Levophed IV heparin IV Ancef  Sodium 137 potassium 4.3 chloride 100 CO2 27 glucose 137 calcium 8.6 phosphorus 1.5 magnesium 2.3 creatinine 0.9 WBC 29 hemoglobin 9.1 platelets 251  Chest x-ray from 08/09/2019 showed probable small pleural effusions no significant change in cardiomegaly vascular congestion with bibasilar airspace disease.   Amiodarone 200 mg daily atorvastatin 20 mg daily, Protonix 40 mg daily,   Abilify 10 mg twice daily, valproic acid 1 g nightly  Objective:  Vital signs in last 24 hours:  Temp:  [97.4 F  (36.3 C)-99 F (37.2 C)] 97.4 F (36.3 C) (11/01 0746) Pulse Rate:  [40-101] 82 (11/01 0800) Resp:  [8-41] 25 (11/01 0800) BP: (52-179)/(28-104) 129/80 (11/01 0800) SpO2:  [21 %-100 %] 100 % (11/01 0800) FiO2 (%):  [30 %] 30 % (11/01 0313) Weight:  [75.2 kg] 75.2 kg (11/01 0615)  Weight change: -3.9 kg Filed Weights   08/11/19 0422 08/12/19 0400 08/13/19 0615  Weight: 81.3 kg 79.1 kg 75.2 kg    Intake/Output: I/O last 3 completed shifts: In: 4374.1 [I.V.:1610.6; NG/GT:2210; IV Piggyback:553.5] Out: 72536 [Urine:180; UYQIH:47425]   Intake/Output this shift:  Total I/O In: 82.4 [I.V.:27.4; NG/GT:55] Out: 410 [Other:410]  General:  Awake and alert HEENT: MMM [place] AT anicteric sclera Neck:  No JVD, no adenopathy CV:  Heart RRR  Lungs:  L/S CTA bilaterally ventilator supported Abd:  abd SNT/ND with normal BS GU:  Bladder non-palpable Extremities: +1 bilateral LE edema. Skin:  No skin rash   Basic Metabolic Panel: Recent Labs  Lab 08/08/19 0320 08/09/19 0327 08/10/19 0437 08/11/19 0458 08/11/19 1627 08/12/19 0305 08/12/19 1606 08/13/19 0531  NA 136 136 134* 134* 135 138 137 137  136  K 3.5 3.7 3.5 4.0 3.7 4.2 4.4 4.3  4.3  CL 106 103 99 98 97* 101 101 100  100  CO2 20* '25 24 23 24 25 27 27  26  ' GLUCOSE 123* 110* 139* 141* 151* 120* 131* 138*  137*  BUN 61* 34* 41* 49* 40* 26* '15 11  10  ' CREATININE 2.90* 1.94* 2.37* 2.64* 2.13* 1.53* 1.15* 0.79  0.90  CALCIUM 8.0* 8.0* 8.1* 8.4* 8.5* 8.5* 8.1* 8.6*  8.7*  MG 1.9 1.7 1.9  --   --  2.2  --  2.3  PHOS 2.6 2.0* 3.2  --  2.4* 1.7* 2.4* 1.5*    Liver Function Tests: Recent Labs  Lab 08/09/19 0327 08/11/19 1627 08/12/19 0305 08/12/19 1606 08/13/19 0531  ALBUMIN 1.7* 1.9* 1.9* 1.8* 2.1*   No results for input(s): LIPASE, AMYLASE in the last 168 hours. No results for input(s): AMMONIA in the last 168 hours.  CBC: Recent Labs  Lab 08/07/19 0327 08/08/19 0320 08/09/19 0327 08/10/19 0437  08/11/19 0730 08/12/19 0305 08/13/19 0531  WBC 12.5* 10.3 13.8* 18.9* 18.3* 21.6* 29.0*  NEUTROABS 10.5* 7.5 10.4* 14.6*  --   --   --   HGB 8.3* 8.3* 8.0* 8.5* 8.4* 9.1* 9.1*  HCT 26.5* 26.7* 25.6* 27.2* 26.4* 28.6* 29.9*  MCV 93.6 93.4 92.1 92.5 89.8 89.9 93.1  PLT 143* 137* 140* 182 186 257 251    Cardiac Enzymes: No results for input(s): CKTOTAL, CKMB, CKMBINDEX, TROPONINI in the last 168 hours.  BNP: Invalid input(s): POCBNP  CBG: Recent Labs  Lab 08/12/19 1555 08/12/19 1947 08/12/19 2320 08/13/19 0317 08/13/19 0743  GLUCAP 88 121* 133* 118* 125*    Microbiology: Results for orders placed or performed during the hospital encounter of 08/03/19  Blood culture (routine x 2)     Status: None   Collection Time: 08/03/19 10:33 AM   Specimen: BLOOD RIGHT HAND  Result Value Ref Range Status   Specimen Description BLOOD RIGHT HAND  Final   Special Requests   Final    BOTTLES DRAWN AEROBIC ONLY Blood Culture results may not be optimal due to an inadequate volume of blood received in culture bottles   Culture   Final    NO GROWTH 5 DAYS Performed at [facility], [place] [address] [place], [place] [address]    Report Status 08/08/2019 FINAL  Final  Blood culture (routine x 2)     Status: Abnormal   Collection Time: 08/03/19 10:48 AM   Specimen: BLOOD  Result Value Ref Range Status   Specimen Description BLOOD LEFT FEMORAL ARTERY  Final   Special Requests   Final    BOTTLES DRAWN AEROBIC AND ANAEROBIC Blood Culture adequate volume   Culture  Setup Time   Final    GRAM POSITIVE COCCI IN CLUSTERS AEROBIC BOTTLE ONLY CRITICAL RESULT CALLED TO, READ BACK BY AND VERIFIED WITH: J. LEDFORD,PHARMD 0207 08/04/2019 Mena Goes Performed at [facility], [place] [address], [place], [place] [address]    Culture STAPHYLOCOCCUS AUREUS (A)  Final   Report Status 08/06/2019 FINAL  Final   Organism ID, Bacteria STAPHYLOCOCCUS AUREUS  Final      Susceptibility   Staphylococcus  aureus - MIC*    CIPROFLOXACIN <=0.5 SENSITIVE Sensitive     ERYTHROMYCIN <=0.25 SENSITIVE Sensitive     GENTAMICIN <=0.5 SENSITIVE Sensitive     OXACILLIN <=0.25 SENSITIVE Sensitive     TETRACYCLINE <=1 SENSITIVE Sensitive     VANCOMYCIN <=0.5 SENSITIVE Sensitive     TRIMETH/SULFA <=10 SENSITIVE Sensitive     CLINDAMYCIN <=0.25 SENSITIVE Sensitive     RIFAMPIN <=0.5 SENSITIVE Sensitive     Inducible Clindamycin NEGATIVE Sensitive     * STAPHYLOCOCCUS AUREUS  SARS Coronavirus 2 by RT PCR (hospital order, performed in [place] hospital lab) Nasopharyngeal Nasopharyngeal Swab     Status: None   Collection  Time: 08/03/19 11:35 AM   Specimen: Nasopharyngeal Swab  Result Value Ref Range Status   SARS Coronavirus 2 NEGATIVE NEGATIVE Final    Comment: (NOTE) If result is NEGATIVE SARS-CoV-2 target nucleic acids are NOT DETECTED. The SARS-CoV-2 RNA is generally detectable in upper and lower  respiratory specimens during the acute phase of infection. The lowest  concentration of SARS-CoV-2 viral copies this assay can detect is 250  copies / mL. A negative result does not preclude SARS-CoV-2 infection  and should not be used as the sole basis for treatment or other  patient management decisions.  A negative result may occur with  improper specimen collection / handling, submission of specimen other  than nasopharyngeal swab, presence of viral mutation(s) within the  areas targeted by this assay, and inadequate number of viral copies  (<250 copies / mL). A negative result must be combined with clinical  observations, patient history, and epidemiological information. If result is POSITIVE SARS-CoV-2 target nucleic acids are DETECTED. The SARS-CoV-2 RNA is generally detectable in upper and lower  respiratory specimens dur ing the acute phase of infection.  Positive  results are indicative of active infection with SARS-CoV-2.  Clinical  correlation with patient history and other diagnostic  information is  necessary to determine patient infection status.  Positive results do  not rule out bacterial infection or co-infection with other viruses. If result is PRESUMPTIVE POSTIVE SARS-CoV-2 nucleic acids MAY BE PRESENT.   A presumptive positive result was obtained on the submitted specimen  and confirmed on repeat testing.  While 2019 novel coronavirus  (SARS-CoV-2) nucleic acids may be present in the submitted sample  additional confirmatory testing may be necessary for epidemiological  and / or clinical management purposes  to differentiate between  SARS-CoV-2 and other Sarbecovirus currently known to infect humans.  If clinically indicated additional testing with an alternate test  methodology [phone]) is advised. The SARS-CoV-2 RNA is generally  detectable in upper and lower respiratory sp ecimens during the acute  phase of infection. The expected result is Negative. Fact Sheet for Patients:  StrictlyIdeas.no Fact Sheet for Healthcare Providers: BankingDealers.co.za This test is not yet approved or cleared by the [place] FDA and has been authorized for detection and/or diagnosis of SARS-CoV-2 by FDA under an Emergency Use Authorization (EUA).  This EUA will remain in effect (meaning this test can be used) for the duration of the COVID-19 declaration under Section 564(b)(1) of the Act, 21 U.S.C. section 360bbb-3(b)(1), unless the authorization is terminated or revoked sooner. Performed at [facility], [place] [address] [place], [place] [address]   Culture, Urine     Status: Abnormal   Collection Time: 08/03/19  1:13 PM   Specimen: Urine, Random  Result Value Ref Range Status   Specimen Description URINE, RANDOM  Final   Special Requests   Final    NONE Performed at [facility], [place] [address] [place], [place] [address]    Culture >=100,000 COLONIES/mL ESCHERICHIA COLI (A)  Final   Report Status  08/05/2019 FINAL  Final   Organism ID, Bacteria ESCHERICHIA COLI (A)  Final      Susceptibility   Escherichia coli - MIC*    AMPICILLIN >=32 RESISTANT Resistant     CEFAZOLIN 8 SENSITIVE Sensitive     CEFTRIAXONE <=1 SENSITIVE Sensitive     CIPROFLOXACIN >=4 RESISTANT Resistant     GENTAMICIN >=16 RESISTANT Resistant     IMIPENEM <=0.25 SENSITIVE Sensitive  NITROFURANTOIN <=16 SENSITIVE Sensitive     TRIMETH/SULFA >=320 RESISTANT Resistant     AMPICILLIN/SULBACTAM 16 INTERMEDIATE Intermediate     PIP/TAZO <=4 SENSITIVE Sensitive     Extended ESBL NEGATIVE Sensitive     * >=100,000 COLONIES/mL ESCHERICHIA COLI  MRSA PCR Screening     Status: None   Collection Time: 08/03/19  2:46 PM   Specimen: Nasopharyngeal  Result Value Ref Range Status   MRSA by PCR NEGATIVE NEGATIVE Final    Comment:        The GeneXpert MRSA Assay (FDA approved for NASAL specimens only), is one component of a comprehensive MRSA colonization surveillance program. It is not intended to diagnose MRSA infection nor to guide or monitor treatment for MRSA infections. Performed at [facility], [place] [address], [place], [place] [address]   Respiratory Panel by PCR     Status: None   Collection Time: 08/03/19  3:56 PM   Specimen: Nasopharyngeal Swab; Respiratory  Result Value Ref Range Status   Adenovirus NOT DETECTED NOT DETECTED Final   Coronavirus 229E NOT DETECTED NOT DETECTED Final    Comment: (NOTE) The Coronavirus on the Respiratory Panel, DOES NOT test for the novel  Coronavirus (2019 nCoV)    Coronavirus HKU1 NOT DETECTED NOT DETECTED Final   Coronavirus NL63 NOT DETECTED NOT DETECTED Final   Coronavirus OC43 NOT DETECTED NOT DETECTED Final   Metapneumovirus NOT DETECTED NOT DETECTED Final   Rhinovirus / Enterovirus NOT DETECTED NOT DETECTED Final   Influenza A NOT DETECTED NOT DETECTED Final   Influenza B NOT DETECTED NOT DETECTED Final   Parainfluenza Virus 1 NOT DETECTED NOT DETECTED  Final   Parainfluenza Virus 2 NOT DETECTED NOT DETECTED Final   Parainfluenza Virus 3 NOT DETECTED NOT DETECTED Final   Parainfluenza Virus 4 NOT DETECTED NOT DETECTED Final   Respiratory Syncytial Virus NOT DETECTED NOT DETECTED Final   Bordetella pertussis NOT DETECTED NOT DETECTED Final   Chlamydophila pneumoniae NOT DETECTED NOT DETECTED Final   Mycoplasma pneumoniae NOT DETECTED NOT DETECTED Final    Comment: Performed at [facility], [place]. [address], [place], [place] [address]  Culture, blood (routine x 2)     Status: None   Collection Time: 08/07/19  6:29 PM   Specimen: BLOOD RIGHT ARM  Result Value Ref Range Status   Specimen Description BLOOD RIGHT ARM  Final   Special Requests   Final    BOTTLES DRAWN AEROBIC ONLY Blood Culture results may not be optimal due to an inadequate volume of blood received in culture bottles   Culture   Final    NO GROWTH 5 DAYS Performed at [facility], [place] [address] [place], [place] [address]    Report Status 08/12/2019 FINAL  Final  Culture, blood (routine x 2)     Status: None   Collection Time: 08/07/19  6:34 PM   Specimen: BLOOD RIGHT ARM  Result Value Ref Range Status   Specimen Description BLOOD RIGHT ARM  Final   Special Requests   Final    BOTTLES DRAWN AEROBIC ONLY Blood Culture results may not be optimal due to an inadequate volume of blood received in culture bottles   Culture   Final    NO GROWTH 5 DAYS Performed at [facility], [place] [address], [place], [place] [address]    Report Status 08/12/2019 FINAL  Final  Culture, blood (routine x 2)     Status: None (Preliminary  result)   Collection Time: 08/10/19 10:16 AM   Specimen: BLOOD RIGHT HAND  Result Value Ref Range Status   Specimen Description BLOOD RIGHT HAND  Final   Special Requests AEROBIC BOTTLE ONLY Blood Culture adequate volume  Final   Culture   Final    NO GROWTH 3 DAYS Performed at [facility], [address], [place], [place]  [address]    Report Status PENDING  Incomplete  Culture, blood (routine x 2)     Status: None (Preliminary result)   Collection Time: 08/10/19 10:17 AM   Specimen: BLOOD RIGHT HAND  Result Value Ref Range Status   Specimen Description BLOOD RIGHT HAND  Final   Special Requests AEROBIC BOTTLE ONLY Blood Culture adequate volume  Final   Culture   Final    NO GROWTH 3 DAYS Performed at [facility], [place] [address] [place], [place] [address]    Report Status PENDING  Incomplete    Coagulation Studies: No results for input(s): LABPROT, INR in the last 72 hours.  Urinalysis: No results for input(s): COLORURINE, LABSPEC, PHURINE, GLUCOSEU, HGBUR, BILIRUBINUR, KETONESUR, PROTEINUR, UROBILINOGEN, NITRITE, LEUKOCYTESUR in the last 72 hours.  Invalid input(s): APPERANCEUR    Imaging: No results found.   Medications:   .  prismasol BGK 4/2.5 500 mL/hr at 08/13/19 0546  .  prismasol BGK 4/2.5 500 mL/hr at 08/13/19 0549  . sodium chloride 10 mL/hr at 08/13/19 0800  .  ceFAZolin (ANCEF) IV Stopped (08/13/19 0408)  . dexmedetomidine (PRECEDEX) IV infusion 0.2 mcg/kg/hr (08/13/19 0800)  . feeding supplement (VITAL AF 1.2 CAL) 1,000 mL (08/12/19 0743)  . heparin 1,250 Units/hr (08/13/19 0805)  . norepinephrine (LEVOPHED) Adult infusion Stopped (08/13/19 0402)  . prismasol BGK 4/2.5 2,000 mL/hr at 08/13/19 0811   . amiodarone  200 mg Per Tube Daily  . ARIPiprazole  10 mg Per Tube BID  . atorvastatin  20 mg Per NG tube Daily  . chlorhexidine gluconate (MEDLINE KIT)  15 mL Mouth Rinse BID  . Chlorhexidine Gluconate Cloth  6 each Topical Daily  . insulin aspart  0-9 Units Subcutaneous Q4H  . mouth rinse  15 mL Mouth Rinse 10 times per day  . pantoprazole sodium  40 mg Per Tube QHS  . sodium chloride flush  10-40 mL Intracatheter Q12H  . Thrombi-Pad  1 each Topical Once  . valproic acid  1,000 mg Per Tube QHS   acetaminophen (TYLENOL) oral liquid 160 mg/5 mL, fentaNYL (SUBLIMAZE)  injection, heparin, hydrALAZINE, sodium chloride, sodium chloride flush  Assessment/ Plan:  1. Chronic kidney disease stage IV with a baseline GFR around 19-20. Chronic kidney disease likely on the basis of longstanding hypertension in the setting of a solitary kidney.  2. Acute kidney injury. Renal ultrasound with no evidence of obstruction. She is status post left nephrectomy. Likely ATN in the setting of shock, cardiac arrest, and E. coli urinary tract expection and MSSA bacteremia. Minimal urine output.   It appears the baseline GFR is between 19 and 20 cc/min so she may not be much change from baseline.  Started CRRT 08/11/2019 for volume removal.  Tolerating greater than 300 ultrafiltration an hour.  3. Status post cardiac arrest.  ROSC 10 minutes 1 dose epinephrine.  Ejection fraction 25 to 30%.  Holding on cardiac catheterization secondary to acute kidney injury.  Underwent bedside TEE 08/09/2019 revealing vegetations on aortic valve  4.Non-anion gap metabolic acidosis. Not issue at this time  5. Hypokalemia.  Repleted  6.  Atrial fibrillation continues on heparin and amiodarone monitoring electrolytes  7.  E. coli UTI continues Ancef 1 g every 24 hours  8. Hypophosphatemia will replete  9.  Volume/hypertension patient now transition to CRRT for ultrafiltration better handle on fluid removal  10. MSSA bacteremia with endocarditis 6 weeks treatment  11.  Encephalopathy slow to resolve.      LOS: [place] '@TODAY' '@8' :40 AM

## 2019-08-13 NOTE — Progress Notes (Signed)
ABG collected  

## 2019-08-13 NOTE — Progress Notes (Signed)
eLink Physician-Brief Progress Note Patient Name: Jaclyn Day DOB: [birth year] MRN: [id number]   Date of Service  08/13/2019  HPI/Events of Note  Multiple issues: 1. Patient on PO meds. NGT/OGT removed. Patient NPO and 2. RR = 30.  eICU Interventions  Will order: 1. Drop NGT, check post placement abdominal film.  2. ABG STAT.      Intervention Category Major Interventions: Other:  Lysle Dingwall 08/13/2019, 9:43 PM

## 2019-08-13 NOTE — Progress Notes (Signed)
Progress Note  Patient Name: ARYIANNA EARWOOD Date of Encounter: 08/13/2019  Primary Cardiologist: Ena Dawley, MD   Subjective   Remains intubated  Inpatient Medications    Scheduled Meds: . amiodarone  200 mg Per Tube Daily  . ARIPiprazole  10 mg Per Tube BID  . atorvastatin  20 mg Per NG tube Daily  . chlorhexidine gluconate (MEDLINE KIT)  15 mL Mouth Rinse BID  . Chlorhexidine Gluconate Cloth  6 each Topical Daily  . insulin aspart  0-9 Units Subcutaneous Q4H  . mouth rinse  15 mL Mouth Rinse 10 times per day  . pantoprazole sodium  40 mg Per Tube QHS  . sodium chloride flush  10-40 mL Intracatheter Q12H  . Thrombi-Pad  1 each Topical Once  . valproic acid  1,000 mg Per Tube QHS   Continuous Infusions: .  prismasol BGK 4/2.5 500 mL/hr at 08/13/19 0546  .  prismasol BGK 4/2.5 500 mL/hr at 08/13/19 0549  . sodium chloride 10 mL/hr at 08/13/19 0800  .  ceFAZolin (ANCEF) IV Stopped (08/13/19 0408)  . dexmedetomidine (PRECEDEX) IV infusion 0.2 mcg/kg/hr (08/13/19 0800)  . feeding supplement (VITAL AF 1.2 CAL) 1,000 mL (08/12/19 0743)  . heparin 1,250 Units/hr (08/13/19 0805)  . norepinephrine (LEVOPHED) Adult infusion Stopped (08/13/19 0402)  . prismasol BGK 4/2.5 2,000 mL/hr at 08/13/19 0811   PRN Meds: acetaminophen (TYLENOL) oral liquid 160 mg/5 mL, fentaNYL (SUBLIMAZE) injection, heparin, hydrALAZINE, sodium chloride, sodium chloride flush   Vital Signs    Vitals:   08/13/19 0715 08/13/19 0746 08/13/19 0800 08/13/19 0838  BP: 118/74 (!) 150/86 129/80   Pulse: 79 79 82 83  Resp: (!) 31 (!) 33 (!) 25 (!) 36  Temp:  (!) 97.4 F (36.3 C)    TempSrc:  Oral    SpO2: 100%  100% 100%  Weight:      Height:        Intake/Output Summary (Last 24 hours) at 08/13/2019 0848 Last data filed at 08/13/2019 0800 Gross per 24 hour  Intake 3107.03 ml  Output 9116 ml  Net -6008.97 ml   Filed Weights   08/11/19 0422 08/12/19 0400 08/13/19 0615  Weight: 81.3 kg 79.1  kg 75.2 kg    Telemetry    nsr - Personally Reviewed  ECG    none - Personally Reviewed  Physical Exam   GEN: intubated on CRRT.   Neck: No JVD Cardiac: RRR, no murmurs, rubs, or gallops.  Respiratory: Clear to auscultation bilaterally. GI: Soft, nontender, non-distended  MS: No edema; No deformity. Neuro:  Nonfocal    Labs    Chemistry Recent Labs  Lab 08/12/19 0305 08/12/19 1606 08/13/19 0531  NA 138 137 137  136  K 4.2 4.4 4.3  4.3  CL 101 101 100  100  CO2 '25 27 27  26  ' GLUCOSE 120* 131* 138*  137*  BUN 26* '15 11  10  ' CREATININE 1.53* 1.15* 0.79  0.90  CALCIUM 8.5* 8.1* 8.6*  8.7*  ALBUMIN 1.9* 1.8* 2.1*  GFRNONAA 34* 47* >60  >60  GFRAA 39* 55* >60  >60  ANIONGAP '12 9 10  10     ' Hematology Recent Labs  Lab 08/11/19 0730 08/12/19 0305 08/13/19 0531  WBC 18.3* 21.6* 29.0*  RBC 2.94* 3.18* 3.21*  HGB 8.4* 9.1* 9.1*  HCT 26.4* 28.6* 29.9*  MCV 89.8 89.9 93.1  MCH 28.6 28.6 28.3  MCHC 31.8 31.8 30.4  RDW 17.1* 17.2* 17.4*  PLT 186 257 251    Cardiac EnzymesNo results for input(s): TROPONINI in the last 168 hours. No results for input(s): TROPIPOC in the last 168 hours.   BNPNo results for input(s): BNP, PROBNP in the last 168 hours.   DDimer No results for input(s): DDIMER in the last 168 hours.   Radiology    No results found.  Cardiac Studies   Echo, TEE reviewed  Patient Profile     73 y.o. female admitted with staph bacteremia/endocarditis/respiratory failure/arrest, renal failure, and atrial fib  Assessment & Plan    1. Atrial fib - she is maintaining NSR on oral amiodarone. 2. VDRF - she will undergo weaning trials either today/tomorrow 3. Acute on chronic systolic heart failure - her EF is 20% by echo. Hopefully her volume status will continue to respond to CRRT 4. SBE - note plans for a prolonged course of anti-biotics  For questions or updates, please contact [facility] Please consult [email or web address] for  contact info under Cardiology/STEMI.    Signed, Cristopher Peru, MD  08/13/2019, 8:48 AM  Patient ID: Karena Addison, female   DOB: 11-10-1945, 73 y.o.   MRN: [id number]

## 2019-08-13 NOTE — Progress Notes (Signed)
ABG values:  Ph: 7.48 Co2: 35 Po2: 66 HCO3: 26.1 SaO2: 94%

## 2019-08-13 NOTE — Procedures (Signed)
Extubation Procedure Note  Patient Details:   Name: Jaclyn Day DOB: 08-Dec-1945 MRN: [id number]   Airway Documentation:    Vent end date: 08/13/19 Vent end time: 1405   Evaluation  O2 sats: stable throughout Complications: No apparent complications Patient did tolerate procedure well. Bilateral Breath Sounds: Rhonchi, Diminished   Yes   RT extubated patient to 4L [place] per MD order with RN and MD at bedside. Positive cuff leak noted. Patient can speak. No stridor noted. Patient currently breathing 30+ times a minute, MD aware and wanting to give patient time to relax her breathing. Bipap PRN ordered. RT and RN will continue to monitor patient.   Vernona Rieger 08/13/2019, 2:25 PM

## 2019-08-13 NOTE — Progress Notes (Signed)
eLink Physician-Brief Progress Note Patient Name: ANGALENA SANOR DOB: [birth year] MRN: [id number]   Date of Service  08/13/2019  HPI/Events of Note  Nurse unable to place NGT.   eICU Interventions  Will change Protonix and Valproic Acid from per tube to IV.      Intervention Category Major Interventions: Other:  Lysle Dingwall 08/13/2019, 11:50 PM

## 2019-08-13 NOTE — Progress Notes (Signed)
[place] for Heparin Indication: atrial fibrillation  Allergies  Allergen Reactions  . Codeine Other (See Comments)    Reaction not recalled by family- was told to never take this    Patient Measurements: Height: '5\' 6"'  (167.6 cm) Weight: 165 lb 12.6 oz (75.2 kg) IBW/kg (Calculated) : 59.3 Heparin Dosing Weight: 78 kg  Vital Signs: Temp: 97.4 F (36.3 C) (11/01 0746) Temp Source: Oral (11/01 0746) BP: 150/86 (11/01 0746) Pulse Rate: 79 (11/01 0746)  Labs: Recent Labs    08/11/19 0730  08/12/19 0305 08/12/19 1207 08/12/19 1606 08/12/19 2054 08/13/19 0531  HGB 8.4*  --  9.1*  --   --   --  9.1*  HCT 26.4*  --  28.6*  --   --   --  29.9*  PLT 186  --  257  --   --   --  251  HEPARINUNFRC  --    < > 0.81* 0.60  --  0.82* 0.76*  CREATININE  --    < > 1.53*  --  1.15*  --  0.79  0.90   < > = values in this interval not displayed.    Estimated Creatinine Clearance: 65.9 mL/min (by C-G formula based on SCr of 0.79 mg/dL).   Medical History: Past Medical History:  Diagnosis Date  . Anemia   . Chronic diastolic CHF (congestive heart failure) ([place])   . CKD (chronic kidney disease), stage IV ([place]   . Hypertension   . Persistent atrial fibrillation ([place])    a. dx 01/2019 s/p TEE DCCV.  [place] Kitchen Renal disorder   . Unilateral congenital absence of kidney     Medications:  Scheduled:  . amiodarone  200 mg Per Tube Daily  . ARIPiprazole  10 mg Per Tube BID  . atorvastatin  20 mg Per NG tube Daily  . chlorhexidine gluconate (MEDLINE KIT)  15 mL Mouth Rinse BID  . Chlorhexidine Gluconate Cloth  6 each Topical Daily  . insulin aspart  0-9 Units Subcutaneous Q4H  . mouth rinse  15 mL Mouth Rinse 10 times per day  . pantoprazole sodium  40 mg Per Tube QHS  . sodium chloride flush  10-40 mL Intracatheter Q12H  . Thrombi-Pad  1 each Topical Once  . valproic acid  1,000 mg Per Tube QHS   Infusions:  .  prismasol BGK 4/2.5 500 mL/hr at  08/13/19 0546  .  prismasol BGK 4/2.5 500 mL/hr at 08/13/19 0549  . sodium chloride 10 mL/hr at 08/13/19 0700  .  ceFAZolin (ANCEF) IV Stopped (08/13/19 0408)  . dexmedetomidine (PRECEDEX) IV infusion 0.2 mcg/kg/hr (08/13/19 0700)  . feeding supplement (VITAL AF 1.2 CAL) 1,000 mL (08/12/19 0743)  . heparin 1,350 Units/hr (08/13/19 0700)  . norepinephrine (LEVOPHED) Adult infusion Stopped (08/13/19 0402)  . prismasol BGK 4/2.5 2,000 mL/hr at 08/13/19 0536    Assessment: Pt is a 73 y/o female admitted s/p out of hospital cardiac arrest. ROSC was achieved and targeted temperature management was initiated. Pt has a history of A-fib (taking eliquis PTA), diastolic heart failure, HTN, HDL, IDDM, CKD. Pharmacy has been consulted to dose heparin for A-fib.  Heparin level above goal this morning at 0.76 despite rate decrease overnight. Confirmed appropriate collection of lab with RN. Hgb stable 9.1, plt wnls. No bleeding issues noted.   Goal of Therapy:  Heparin level 0.3-0.7 units/ml Monitor platelets by anticoagulation protocol: Yes   Plan:  Decrease heparin to 1250  units/hr Check heparin level in 8 hours Daily heparin level, CBC Monitor for bleeding  Vertis Kelch, PharmD PGY2 Cardiology Pharmacy Resident Phone [phone] 08/13/2019       7:57 AM  Please check [email or web address] for unit-specific pharmacist phone numbers

## 2019-08-13 NOTE — Progress Notes (Signed)
NAME:  Jaclyn Day, MRN:  [id number], DOB:  January 21, 1946, LOS: 61 ADMISSION DATE:  08/03/2019, CONSULTATION DATE:  10/22 REFERRING MD:  Dr. Sherril Croon, CHIEF COMPLAINT:  Post arrest    Brief History   73yo female admitted after out of hospital cardiac arrest, unclear etiology CPR started by fire 10 min CPR with 1 Epi before ROSC. PCCM called for admission and need for targeted temperature management and ventilator management.   Of note, admitted at this facility 01/25/2019-01/31/2019 for new onset A-fib with development of diastolic congestive heart failure, during admission she underwent TEE and cardioversion with transition to SR.   Past Medical History  Congenital unilateral kidney  CKD stage IV (baseline creatinine 2.2-2.8) w/hx of nephrectomy  Hypertension  Persistent A-fib on anticoagulation/ Eliquis Chronic diastolic CHF Anemia  Cardioversion and TEE 01/31/2019 Mood disorder  Significant Hospital Events   10/22 Admitted post arrest, intubated, initiation of targeted temperature management 36 celsius   10/23 + 800 ml I/O, TTM, Weaned off Levophed overnight, ECHO: LVEF: 20-25%, global hypokinesis, RV midly reduced function with normal size, mild MR, normal pulmonary pressures.  Lactate cleared  10/24 - 40% fio2, Oliguric v Anuric. On amio gtt, heparin gtt, neo gtt . Being weaned off neo gtt. Rewarmed to 37. Non purposeful only (not on sedation). Growing e colii in urine from admission  10/25 - staph identified in blood and vanc started yesterday. Still with poor Ur OP. Foley leak suspected and foley stopped. No new foley . RT says patient followed commands and wants to do SBT. On amio gtt. Off leveophed an dneo. On Heparin gtt.  iHD w/ 1L off   10/26-  following simple commands, placed on precedex for agitation, febrile- re-cultured and femoral CVL discontinued   10/28- converted to NSR, TEE   10/30 - CRRT for volume removal  Consults:  PCCM ID Cards  Procedures:  ETT 10/22  >> R radial Aline 10/22 >> 10/27 Right femoral CVC 10/22 >>10/26 R IJ trialysis 10/25 >>  Significant Diagnostic Tests:  10/22:  ECHO: LVEF: 20-25%, global hypokinesis, RV midly reduced function with normal size, mild MR, normal pulmonary pressures.  10/22: CTH without acute intracranial pathology  [place] 10/23 - IMPRESSION: 1. Status post left nephrectomy 2. Echogenic right kidney with cortical thinning consistent with medical renal disease. No hydronephrosis. Cysts, fewer than 10 in the right kidney. 3. Right pleural effusion  10/26 limited TTE >> EF 25-30%, mod reduced RV function  10/29 TEE - EF 55-60%, severely dilated left atrium, small filamentous structure on aortic valve, suggestive of vegetation  Micro Data:  Blood culture 10/22 >> staph nos 1/4 COVID 10/22 >> Negative RPP 10/22 >> Negative Urine 10/22 - E colii 10/26 BCx 2 >> pending  Antimicrobials:  Rocephin 10/22 >> 10/25 Vanc (staph in blood ) 10/24  Cefazolin 10/25 >>  Interim history/subjective:  Overnight had episode where patient was lying flat for Flexiseal care and developed hypoxemia and cyanosis. RT was called and she was bagged with improved saturations after 1-2 minutes. Good O2 sat this morning on minimal vent settings.  Net negative 1.8L since admission with CRRT. Hypotensive this morning. Currently receiving LR bolus.  Objective   Blood pressure (!) 55/37, pulse (!) 56, temperature (!) 97.4 F (36.3 C), temperature source Oral, resp. rate (!) 34, height 5\' 6"  (1.676 m), weight 75.2 kg, SpO2 100 %.    Vent Mode: PRVC FiO2 (%):  [30 %] 30 % Set Rate:  [20 bmp] 20 bmp Vt  Set:  [470 mL] 470 mL PEEP:  [5 cmH20] 5 cmH20 Plateau Pressure:  [15 cmH20-24 cmH20] 24 cmH20   Intake/Output Summary (Last 24 hours) at 08/13/2019 1023 Last data filed at 08/13/2019 1000 Gross per 24 hour  Intake 3050.21 ml  Output 9221 ml  Net -6170.79 ml   Filed Weights   08/11/19 0422 08/12/19 0400 08/13/19 0615    Weight: 81.3 kg 79.1 kg 75.2 kg   Physical Exam: General: Elderly female laying in bed, chronically ill-appearing, lethargic HENT: [place], AT, ETT in place Eyes: EOMI, no scleral icterus Respiratory: Clear to auscultation bilaterally.  No crackles, wheezing or rales Cardiovascular: RRR, -M/R/G, no JVD GI: BS+, soft, nontender Extremities:-Edema,-tenderness Neuro: Lethargic, opens eyes to voice, does not follow commands Skin: Intact, no rashes or bruising GU: Foley in place  Resolved Hospital Problem list    Assessment & Plan:  This is a 73 yo F being admitted to ICU for witnessed out of hospital cardiac arrest, unclear etiology, with field ROSC, resultant respiratory failure and encephalopathy s/p TTM at 36 degrees celsius found to have E. Coli UTI and 1/4+ stap aureus on blood cultures.  Hypotension Aggressive volume removal vs sedation vs sepsis P: Discontinue Precedex LR bolus 500cc now Levophed for MAP >65 If hypotension persistent, consider re-culture and empiric antibiotics  Agitation/anxiety requiring sedation P:  Discontinue precedex due to hypotension Continue home depakote and abilify Prn fentanyl, avoid benzo's, RASS goal 0-/1  Acute hypoxic respiratory failure due to cardiac arrest, pulmonary edema  Episode of ?mucous plugging last night P: Full vent support Hold volume removal per CRRT for now due to hypotension.  Appreciate Nephrology support. CXR today   Staph aureus bacteremia secondary endocarditis  E colii UTI  Worsening leukocytosis, unclear etiology. Cultures remain negative. Will  review lines  P: Appreciate ID input. Consider right foot XR Continue cefazolin. End date six weeks from last negative culture 08/07/19 - 09/18/19 Follow-up 10/29 cultures to ensure negative Trend fever / WBC curve. Continue to monitor leukocytosis for now  Out of hospital cardiac arrest s/p TTM Suspect this was precipitated by E Colii UTI +/- staph aureus bacteremia P:   Telemetry Goal MAP > 65 Goal K and Mg, 4 and 2 respectively  Acute systolic heart failure (baseline normal EF in April 2020) -ECHO 08/03/2019: LVEF: 20-25%, global hypokinesis, RV midly reduced function with normal size, mild MR, normal pulmonary pressures.  -TTE 10/26 shows EF 20-25% -TEE EF recovered 55-60% P:  [facility] input. Cath deferred due to AKI  No BB/ ACEi Volume removal per iHD  PAF  Currently in NSR P:  Cards following PO amio Continue heparin gtt  Acute on chronic CKD IV, likely ATN component given cardiac arrest Congential Unilateral Kidney   - remains oliguric, renal US without evidence of obstruction  - started on iHD 10/25, 10/27, 10/30-11/1 P:   Dialysis per Nephrology  Electrolyte replacement per HD  Foley catheter for strict I&Os  Trend BMP / phos/ mag  Hx of mood disorder P:  Continue home depakote and abilify Holding home cymbalta  Hypophosphatemia P: Replete  Best practice:  Diet:  TF Pain/Anxiety/Delirium protocol (if indicated): prn fentanyl  VAP protocol (if indicated): Yes DVT prophylaxis: Heparin gtt  GI prophylaxis: PPI  Glucose control: SSI Q4H - remains controlled Mobility: Bed rest  Code Status: FULL  Family Communication: longterm boyfriend (states HPOA as well) updated 10/31. pts sister approves giving information and making decisions to him Disposition: ICU   The  patient is critically ill with multiple organ systems failure and requires high complexity decision making for assessment and support, frequent evaluation and titration of therapies, application of advanced monitoring technologies and extensive interpretation of multiple databases.   Critical Care Time devoted to patient care services described in this note is 40 Minutes.   Rodman Pickle, M.D. [facility] Pulmonary/Critical Care Medicine 08/13/2019 10:23 AM  Pager: [phone] After hours pager: [phone]

## 2019-08-14 ENCOUNTER — Inpatient Hospital Stay (HOSPITAL_COMMUNITY): Payer: 59

## 2019-08-14 DIAGNOSIS — I4891 Unspecified atrial fibrillation: Secondary | ICD-10-CM | POA: Diagnosis not present

## 2019-08-14 DIAGNOSIS — I330 Acute and subacute infective endocarditis: Secondary | ICD-10-CM | POA: Diagnosis not present

## 2019-08-14 DIAGNOSIS — R7881 Bacteremia: Secondary | ICD-10-CM | POA: Diagnosis not present

## 2019-08-14 DIAGNOSIS — I469 Cardiac arrest, cause unspecified: Secondary | ICD-10-CM | POA: Diagnosis not present

## 2019-08-14 DIAGNOSIS — J9601 Acute respiratory failure with hypoxia: Secondary | ICD-10-CM | POA: Diagnosis not present

## 2019-08-14 DIAGNOSIS — R0682 Tachypnea, not elsewhere classified: Secondary | ICD-10-CM

## 2019-08-14 DIAGNOSIS — Z885 Allergy status to narcotic agent status: Secondary | ICD-10-CM

## 2019-08-14 DIAGNOSIS — R58 Hemorrhage, not elsewhere classified: Secondary | ICD-10-CM

## 2019-08-14 DIAGNOSIS — Z8674 Personal history of sudden cardiac arrest: Secondary | ICD-10-CM

## 2019-08-14 DIAGNOSIS — I5043 Acute on chronic combined systolic (congestive) and diastolic (congestive) heart failure: Secondary | ICD-10-CM | POA: Diagnosis not present

## 2019-08-14 DIAGNOSIS — B9561 Methicillin susceptible Staphylococcus aureus infection as the cause of diseases classified elsewhere: Secondary | ICD-10-CM | POA: Diagnosis not present

## 2019-08-14 LAB — BASIC METABOLIC PANEL
Anion gap: 11 (ref 5–15)
BUN: 21 mg/dL (ref 8–23)
CO2: 24 mmol/L (ref 22–32)
Calcium: 8.7 mg/dL — ABNORMAL LOW (ref 8.9–10.3)
Chloride: 101 mmol/L (ref 98–111)
Creatinine, Ser: 1.84 mg/dL — ABNORMAL HIGH (ref 0.44–1.00)
GFR calc Af Amer: 31 mL/min — ABNORMAL LOW (ref >60)
GFR calc non Af Amer: 27 mL/min — ABNORMAL LOW (ref >60)
Glucose, Bld: 116 mg/dL — ABNORMAL HIGH (ref 70–99)
Potassium: 4.6 mmol/L (ref 3.5–5.1)
Sodium: 136 mmol/L (ref 135–145)

## 2019-08-14 LAB — CBC
HCT: 25.9 % — ABNORMAL LOW (ref 36.0–46.0)
Hemoglobin: 8 g/dL — ABNORMAL LOW (ref 12.0–15.0)
MCH: 28.9 pg (ref 26.0–34.0)
MCHC: 30.9 g/dL (ref 30.0–36.0)
MCV: 93.5 fL (ref 80.0–100.0)
Platelets: 253 10*3/uL (ref 150–400)
RBC: 2.77 MIL/uL — ABNORMAL LOW (ref 3.87–5.11)
RDW: 17.5 % — ABNORMAL HIGH (ref 11.5–15.5)
WBC: 22.5 10*3/uL — ABNORMAL HIGH (ref 4.0–10.5)
nRBC: 0 % (ref 0.0–0.2)

## 2019-08-14 LAB — HEPARIN LEVEL (UNFRACTIONATED): Heparin Unfractionated: 0.47 IU/mL (ref 0.30–0.70)

## 2019-08-14 LAB — PHOSPHORUS: Phosphorus: 4 mg/dL (ref 2.5–4.6)

## 2019-08-14 LAB — GLUCOSE, CAPILLARY
Glucose-Capillary: 102 mg/dL — ABNORMAL HIGH (ref 70–99)
Glucose-Capillary: 111 mg/dL — ABNORMAL HIGH (ref 70–99)
Glucose-Capillary: 111 mg/dL — ABNORMAL HIGH (ref 70–99)
Glucose-Capillary: 106 mg/dL — ABNORMAL HIGH (ref 70–99)
Glucose-Capillary: 103 mg/dL — ABNORMAL HIGH (ref 70–99)

## 2019-08-14 MED ORDER — ORAL CARE MOUTH RINSE
15.0000 mL | Freq: Two times a day (BID) | OROMUCOSAL | Status: DC
  Administered 2019-08-14 – 2019-08-15 (×4): 15 mL via OROMUCOSAL

## 2019-08-14 MED ORDER — CHLORHEXIDINE GLUCONATE 0.12 % MT SOLN
15.0000 mL | Freq: Two times a day (BID) | OROMUCOSAL | Status: DC
  Administered 2019-08-14 – 2019-09-18 (×72): 15 mL via OROMUCOSAL
  Filled 2019-08-14 (×69): qty 15

## 2019-08-14 NOTE — Progress Notes (Signed)
Chaplain provided follow-up visit with Ms. Jaclyn Day and her boyfriend, Jaclyn Day. [name] expressed his happiness over [place] no longer being on a ventilator and that she has been getting better day by day.    Chaplain will continue to follow-up.

## 2019-08-14 NOTE — Progress Notes (Addendum)
NAME:  Jaclyn Day, MRN:  [id number], DOB:  1946-07-12, LOS: 16 ADMISSION DATE:  08/03/2019, CONSULTATION DATE:  10/22 REFERRING MD:  Dr. Sherril Croon, CHIEF COMPLAINT:  Post arrest    Brief History   73yo female admitted after out of hospital cardiac arrest, unclear etiology CPR started by fire 10 min CPR with 1 Epi before ROSC. PCCM called for admission and need for targeted temperature management and ventilator management.   Of note, admitted at this facility 01/25/2019-01/31/2019 for new onset A-fib with development of diastolic congestive heart failure, during admission she underwent TEE and cardioversion with transition to SR.   Past Medical History  Congenital unilateral kidney  CKD stage IV (baseline creatinine 2.2-2.8) w/hx of nephrectomy  Hypertension  Persistent A-fib on anticoagulation/ Eliquis Chronic diastolic CHF Anemia  Cardioversion and TEE 01/31/2019 Mood disorder  Significant Hospital Events   10/22 Admitted post arrest, intubated, initiation of targeted temperature management 36 celsius   10/23 + 800 ml I/O, TTM, Weaned off Levophed overnight, ECHO: LVEF: 20-25%, global hypokinesis, RV midly reduced function with normal size, mild MR, normal pulmonary pressures.  Lactate cleared  10/24 - 40% fio2, Oliguric v Anuric. On amio gtt, heparin gtt, neo gtt . Being weaned off neo gtt. Rewarmed to 37. Non purposeful only (not on sedation). Growing e colii in urine from admission  10/25 - staph identified in blood and vanc started yesterday. Still with poor Ur OP. Foley leak suspected and foley stopped. No new foley . RT says patient followed commands and wants to do SBT. On amio gtt. Off leveophed an dneo. On Heparin gtt.  iHD w/ 1L off   10/26-  following simple commands, placed on precedex for agitation, febrile- re-cultured and femoral CVL discontinued   10/28- converted to NSR, TEE   10/30 - CRRT for volume removal  Consults:  PCCM ID Cards  Procedures:  ETT 10/22  >>11/1 R radial Aline 10/22 >> 10/27 Right femoral CVC 10/22 >>10/26 R IJ trialysis 10/25 >>  Significant Diagnostic Tests:  10/22:  ECHO: LVEF: 20-25%, global hypokinesis, RV midly reduced function with normal size, mild MR, normal pulmonary pressures.  10/22: CTH without acute intracranial pathology  [place] 10/23 - IMPRESSION: 1. Status post left nephrectomy 2. Echogenic right kidney with cortical thinning consistent with medical renal disease. No hydronephrosis. Cysts, fewer than 10 in the right kidney. 3. Right pleural effusion  10/26 limited TTE >> EF 25-30%, mod reduced RV function  10/29 TEE - EF 55-60%, severely dilated left atrium, small filamentous structure on aortic valve, suggestive of vegetation  Micro Data:  Blood culture 10/22 >> staph nos 1/4 COVID 10/22 >> Negative RPP 10/22 >> Negative Urine 10/22 - E colii 10/26 BCx 2 >> pending  Antimicrobials:  Rocephin 10/22 >> 10/25 Vanc (staph in blood ) 10/24  Cefazolin 10/25 >>  Interim history/subjective:  Extubated yesterday. Currently on heated HFNC. Did not tolerate BiPAP due to high TV. She has tachypnea however able to maintain appropriate oxygenation.  Objective   Blood pressure (!) 125/57, pulse 88, temperature 98.6 F (37 C), temperature source Oral, resp. rate (!) 47, height 5\' 6"  (1.676 m), weight 74 kg, SpO2 93 %.    Vent Mode: BIPAP;PCV FiO2 (%):  [30 %-40 %] 30 % Set Rate:  [10 bmp-20 bmp] 10 bmp Vt Set:  [470 mL] 470 mL PEEP:  [5 cmH20-6 cmH20] 6 cmH20 Plateau Pressure:  [16 cmH20] 16 cmH20   Intake/Output Summary (Last 24 hours) at 08/14/2019 [phone]  Last data filed at 08/14/2019 0700 Gross per 24 hour  Intake 1931.56 ml  Output 960 ml  Net 971.56 ml   Filed Weights   08/12/19 0400 08/13/19 0615 08/14/19 0500  Weight: 79.1 kg 75.2 kg 74 kg   Physical Exam: General: Elderly chronically ill-appearing, no acute distress HENT: [place], AT, OP clear, MMM Eyes: EOMI, no scleral icterus Respiratory:  Clear to auscultation bilaterally.  No crackles, wheezing or rales Cardiovascular: RRR, -M/R/G, no JVD GI: BS+, soft, nontender Extremities:-Edema,-tenderness Neuro: Awake, alert, intermittently follows commands Psych: Anxious mood GU: Foley in place  CXR 11/2 - small bilateral pleural effusions R>L  Resolved Hospital Problem list   Hypotension secondary to CRRT  Assessment & Plan:  This is a 73 yo F being admitted to ICU for witnessed out of hospital cardiac arrest, unclear etiology, with field ROSC, resultant respiratory failure and encephalopathy s/p TTM at 36 degrees celsius found to have E. Coli UTI and 1/4+ stap aureus on blood cultures.  Agitation/anxiety requiring sedation P:  Off Precedex. Restart as needed until we can obtain enteral access  Consider low-dose scheduled benzos Continue home depakote and abilify Prn fentanyl, minimize benzo's  Acute hypoxic respiratory failure due to cardiac arrest, pulmonary edema  Extubated 11/1.  P: On heated HFNC   Staph aureus bacteremia secondary endocarditis  E colii UTI  Improving leukocytosis. Cultures remain negative. Will  review lines  P: Appreciate ID input.  Continue cefazolin. End date six weeks from last negative culture 08/07/19 - 09/18/19 Follow-up 10/29 cultures to ensure negative Trend fever / WBC curve. Continue to monitor leukocytosis for now  Out of hospital cardiac arrest s/p TTM Suspect this was precipitated by E Colii UTI +/- staph aureus bacteremia P:  Telemetry Goal MAP > 65 Goal K and Mg, 4 and 2 respectively  Acute systolic heart failure (baseline normal EF in April 2020) -ECHO 08/03/2019: LVEF: 20-25%, global hypokinesis, RV midly reduced function with normal size, mild MR, normal pulmonary pressures.  -TTE 10/26 shows EF 20-25% -TEE EF recovered 55-60% P:  [place] [facility] input. Cath deferred due to AKI  No BB/ ACEi  PAF  Currently in NSR P:  Cards following PO amio Continue heparin  gtt  Acute on chronic CKD IV, likely ATN component given cardiac arrest Congential Unilateral Kidney   - remains oliguric, renal US without evidence of obstruction  - started on iHD 10/25, 10/27, 10/30-11/1 P:   Dialysis per Nephrology. Currently holding due to recent hypotension  Electrolyte replacement per HD  Foley catheter for strict I&Os  Monitor UOP off dialysis for renal recovery  Trend BMP  Hx of mood disorder P:  Continue home depakote and abilify Holding home cymbalta  TMJ  Consult oral surgery   Best practice:  Diet:  Restart TF when enteral access obtained Pain/Anxiety/Delirium protocol (if indicated): prn fentanyl  VAP protocol (if indicated): Yes DVT prophylaxis: Heparin gtt  GI prophylaxis: PPI  Glucose control: SSI Q4H - remains controlled Mobility: Bed rest  Code Status: FULL  Family Communication: longterm boyfriend (states HPOA as well) updated 11/2. pts sister approves giving information and making decisions to him Disposition: ICU   The patient is critically ill with multiple organ systems failure and requires high complexity decision making for assessment and support, frequent evaluation and titration of therapies, application of advanced monitoring technologies and extensive interpretation of multiple databases.   Critical Care Time devoted to patient care services described in this note is 56 Minutes. This time reflects  time of care of this signee Dr. Rodman Pickle. This critical care time does not reflect procedure time, or teaching time or supervisory time of PA/NP/Med student/Med Resident etc but could involve care discussion time.  Rodman Pickle, M.D. [facility] Pulmonary/Critical Care Medicine 08/14/2019 8:57 AM  Pager: [phone] After hours pager: [phone]

## 2019-08-14 NOTE — Progress Notes (Signed)
[facility]  Date of Admission:  08/03/2019      Total days of antibiotics 12  Cefazolin day 9           ASSESSMENT: MIRSA FATE is a 73 y.o. female female with cardiac arrest outside the hospital (unclear etiology). Found to have MSSA bacteremia and significantly depressed LVEF (20-25%) in the setting of AFib RVR following arrest. 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   Endocarditis of native Aortic Valve. Blood cultures on 10/29 no growth, final. Continue Cefazolin for 6 weeks, tentatively 09/18/19. Would ask TCTS to see her regarding left sided endocarditis due to staphylococcus.    Leukocytosis improving this morning. No new signs of infection at this time. Continue to follow/trend.       PLAN: 1. Continue Cefazolin x 6 weeks through Dec 7 2. Continue to trend fever/WBC - improving 3. Would ask TCTS to formally see her.     Principal Problem:   MSSA bacteremia Active Problems:   Cardiac arrest ([place])   Pressure injury of skin   Acute respiratory failure with hypoxia (HCC)   AKI (acute kidney injury) (HCC)   Elevated troponin   Persistent atrial fibrillation (HCC)   Acute on chronic combined systolic and diastolic CHF (congestive heart failure) (HCC)   Acute bacterial endocarditis   . amiodarone  200 mg Per Tube Daily  . ARIPiprazole  10 mg Per Tube BID  . atorvastatin  20 mg Per NG tube Daily  . chlorhexidine  15 mL Mouth Rinse BID  . Chlorhexidine Gluconate Cloth  6 each Topical Daily  . insulin aspart  0-9 Units Subcutaneous Q4H  . mouth rinse  15 mL Mouth Rinse q12n4p  . pantoprazole (PROTONIX) IV  40 mg Intravenous Q24H  . sodium chloride flush  10-40 mL Intracatheter Q12H  . Thrombi-Pad  1 each Topical Once    SUBJECTIVE: "How can I get out of here." Generally in pain all over.   Interval Addition -  Stopped CVVHD and following for renal recovery/UOP off for now.  Extubated and on 15 L  HFNC   Review of Systems: Review of Systems  Unable to perform ROS: Critical illness  Unable to get meaningful ROS   Allergies  Allergen Reactions  . Codeine Other (See Comments)    Reaction not recalled by family- was told to never take this    OBJECTIVE: Vitals:   08/14/19 0753 08/14/19 0800 08/14/19 0900 08/14/19 0903  BP:  (!) 160/76 (!) 166/90 (!) 166/90  Pulse: 88 88 91   Resp: (!) 47 (!) 49 (!) 38   Temp:      TempSrc:      SpO2: 93% 93% 94%   Weight:      Height:       Body mass index is 26.33 kg/m.  Physical Exam Constitutional:      Comments: Upright in bed. Short quick breaths. Alert.   HENT:     Mouth/Throat:     Mouth: Mucous membranes are dry.  Eyes:     General: No scleral icterus.    Pupils: Pupils are equal, round, and reactive to light.  Neck:     Comments: R IJ HD cath clean and dry  Cardiovascular:     Rate and Rhythm: Normal rate and regular rhythm.     Heart sounds: No murmur.     Comments: NSR on tele Pulmonary:     Effort:  Pulmonary effort is normal.     Breath sounds: No rhonchi.     Comments: Shallow inspiratory effort. POx 94% on 15LPM HFNC. Tachypneic.  Abdominal:     General: Bowel sounds are normal. There is no distension.  Musculoskeletal:        General: No swelling.  Skin:    General: Skin is warm and dry.     Capillary Refill: Capillary refill takes less than 2 seconds.     Comments: Scattered areas of ecchymosis   Neurological:     Mental Status: She is alert.     Comments: Unable to meaningfully assess. Did follow commands and MAE x 4.      Lab Results Lab Results  Component Value Date   WBC 22.5 (H) 08/14/2019   HGB 8.0 (L) 08/14/2019   HCT 25.9 (L) 08/14/2019   MCV 93.5 08/14/2019   PLT 253 08/14/2019    Lab Results  Component Value Date   CREATININE 1.84 (H) 08/14/2019   BUN 21 08/14/2019   NA 136 08/14/2019   K 4.6 08/14/2019   CL 101 08/14/2019   CO2 24 08/14/2019    Lab Results  Component Value  Date   ALT 20 08/03/2019   AST 26 08/03/2019   ALKPHOS 94 08/03/2019   BILITOT 1.2 08/03/2019     Microbiology: Recent Results (from the past 240 hour(s))  Culture, blood (routine x 2)     Status: None   Collection Time: 08/07/19  6:29 PM   Specimen: BLOOD RIGHT ARM  Result Value Ref Range Status   Specimen Description BLOOD RIGHT ARM  Final   Special Requests   Final    BOTTLES DRAWN AEROBIC ONLY Blood Culture results may not be optimal due to an inadequate volume of blood received in culture bottles   Culture   Final    NO GROWTH 5 DAYS Performed at [facility], [place] [address] [place], [place] [address]    Report Status 08/12/2019 FINAL  Final  Culture, blood (routine x 2)     Status: None   Collection Time: 08/07/19  6:34 PM   Specimen: BLOOD RIGHT ARM  Result Value Ref Range Status   Specimen Description BLOOD RIGHT ARM  Final   Special Requests   Final    BOTTLES DRAWN AEROBIC ONLY Blood Culture results may not be optimal due to an inadequate volume of blood received in culture bottles   Culture   Final    NO GROWTH 5 DAYS Performed at [facility], [place] [address], [place], [place] [address]    Report Status 08/12/2019 FINAL  Final  Culture, blood (routine x 2)     Status: None (Preliminary result)   Collection Time: 08/10/19 10:16 AM   Specimen: BLOOD RIGHT HAND  Result Value Ref Range Status   Specimen Description BLOOD RIGHT HAND  Final   Special Requests AEROBIC BOTTLE ONLY Blood Culture adequate volume  Final   Culture   Final    NO GROWTH 4 DAYS Performed at [facility], [place] [address] [place], [place] [address]    Report Status PENDING  Incomplete  Culture, blood (routine x 2)     Status: None (Preliminary result)   Collection Time: 08/10/19 10:17 AM   Specimen: BLOOD RIGHT HAND  Result Value Ref Range Status   Specimen Description BLOOD RIGHT HAND  Final   Special Requests AEROBIC BOTTLE ONLY Blood Culture adequate volume  Final  Culture   Final    NO GROWTH 4 DAYS Performed at [facility], [place] [address], [place], [place] [address]    Report Status PENDING  Incomplete     Janene Madeira, MSN, NP-C [facility] [email or web address] Pager: [phone] Office: [place]: [phone]

## 2019-08-14 NOTE — Consult Note (Signed)
Reason for Consult: jaw locked open   ABIGEAL POERTNER is an 73 y.o. female admitted 08/03/19 post cardiac arrest, extubated yesterday. Now with suspected open locking of jaw.     Past Medical History:  Diagnosis Date  . Anemia   . Chronic diastolic CHF (congestive heart failure) ([place])   . CKD (chronic kidney disease), stage IV ([place])   . Hypertension   . Persistent atrial fibrillation ([place])    a. dx 01/2019 s/p TEE DCCV.  Marland Kitchen Renal disorder   . Unilateral congenital absence of kidney     Past Surgical History:  Procedure Laterality Date  . CARDIOVERSION N/A 01/31/2019   Procedure: CARDIOVERSION;  Surgeon: Lelon Perla, MD;  Location: [facility] ENDOSCOPY;  Service: Cardiovascular;  Laterality: N/A;  . TEE WITHOUT CARDIOVERSION N/A 01/31/2019   Procedure: TRANSESOPHAGEAL ECHOCARDIOGRAM (TEE);  Surgeon: Lelon Perla, MD;  Location: [facility] ENDOSCOPY;  Service: Cardiovascular;  Laterality: N/A;    Family History  Problem Relation Age of Onset  . Atrial fibrillation Father   . Atrial fibrillation Brother     Social History:  reports that she has never smoked. She has never used smokeless tobacco. She reports previous alcohol use. She reports previous drug use.  Allergies:  Allergies  Allergen Reactions  . Codeine Other (See Comments)    Reaction not recalled by family- was told to never take this    Medications: I have reviewed the patient's current medications.  Results for orders placed or performed during the hospital encounter of 08/03/19 (from the past 48 hour(s))  Glucose, capillary     Status: None   Collection Time: 08/12/19  3:55 PM  Result Value Ref Range   Glucose-Capillary 88 70 - 99 mg/dL  Renal function panel (daily at 1600)     Status: Abnormal   Collection Time: 08/12/19  4:06 PM  Result Value Ref Range   Sodium 137 135 - 145 mmol/L   Potassium 4.4 3.5 - 5.1 mmol/L   Chloride 101 98 - 111 mmol/L   CO2 27 22 - 32 mmol/L   Glucose, Bld 131 (H) 70 - 99 mg/dL    BUN 15 8 - 23 mg/dL   Creatinine, Ser 1.15 (H) 0.44 - 1.00 mg/dL   Calcium 8.1 (L) 8.9 - 10.3 mg/dL   Phosphorus 2.4 (L) 2.5 - 4.6 mg/dL   Albumin 1.8 (L) 3.5 - 5.0 g/dL   GFR calc non Af Amer 47 (L) >60 mL/min   GFR calc Af Amer 55 (L) >60 mL/min   Anion gap 9 5 - 15    Comment: Performed at [facility], [address], [place], [place] [address]  Glucose, capillary     Status: Abnormal   Collection Time: 08/12/19  7:47 PM  Result Value Ref Range   Glucose-Capillary 121 (H) 70 - 99 mg/dL  Heparin level (unfractionated)     Status: Abnormal   Collection Time: 08/12/19  8:54 PM  Result Value Ref Range   Heparin Unfractionated 0.82 (H) 0.30 - 0.70 IU/mL    Comment: (NOTE) If heparin results are below expected values, and patient dosage has  been confirmed, suggest follow up testing of antithrombin III levels. Performed at [facility], [place] [address] [place], [place] [address]   Glucose, capillary     Status: Abnormal   Collection Time: 08/12/19 11:20 PM  Result Value Ref Range   Glucose-Capillary 133 (H) 70 - 99 mg/dL  Glucose, capillary     Status:  Abnormal   Collection Time: 08/13/19  3:17 AM  Result Value Ref Range   Glucose-Capillary 118 (H) 70 - 99 mg/dL  Basic metabolic panel     Status: Abnormal   Collection Time: 08/13/19  5:31 AM  Result Value Ref Range   Sodium 137 135 - 145 mmol/L   Potassium 4.3 3.5 - 5.1 mmol/L   Chloride 100 98 - 111 mmol/L   CO2 27 22 - 32 mmol/L   Glucose, Bld 138 (H) 70 - 99 mg/dL   BUN 11 8 - 23 mg/dL   Creatinine, Ser 0.79 0.44 - 1.00 mg/dL   Calcium 8.6 (L) 8.9 - 10.3 mg/dL   GFR calc non Af Amer >60 >60 mL/min   GFR calc Af Amer >60 >60 mL/min   Anion gap 10 5 - 15    Comment: Performed at [facility], [place] [address] [place], [place] [address]  CBC     Status: Abnormal   Collection Time: 08/13/19  5:31 AM  Result Value Ref Range   WBC 29.0 (H) 4.0 - 10.5 K/uL   RBC 3.21 (L) 3.87 - 5.11 MIL/uL   Hemoglobin  9.1 (L) 12.0 - 15.0 g/dL   HCT 29.9 (L) 36.0 - 46.0 %   MCV 93.1 80.0 - 100.0 fL   MCH 28.3 26.0 - 34.0 pg   MCHC 30.4 30.0 - 36.0 g/dL   RDW 17.4 (H) 11.5 - 15.5 %   Platelets 251 150 - 400 K/uL   nRBC 0.0 0.0 - 0.2 %    Comment: Performed at [facility], [place] [address], [place], [place] [address]  Renal function panel (daily at 0500)     Status: Abnormal   Collection Time: 08/13/19  5:31 AM  Result Value Ref Range   Sodium 136 135 - 145 mmol/L   Potassium 4.3 3.5 - 5.1 mmol/L   Chloride 100 98 - 111 mmol/L   CO2 26 22 - 32 mmol/L   Glucose, Bld 137 (H) 70 - 99 mg/dL   BUN 10 8 - 23 mg/dL   Creatinine, Ser 0.90 0.44 - 1.00 mg/dL   Calcium 8.7 (L) 8.9 - 10.3 mg/dL   Phosphorus 1.5 (L) 2.5 - 4.6 mg/dL   Albumin 2.1 (L) 3.5 - 5.0 g/dL   GFR calc non Af Amer >60 >60 mL/min   GFR calc Af Amer >60 >60 mL/min   Anion gap 10 5 - 15    Comment: Performed at [facility] [address], [place], [place] [address]  Magnesium     Status: None   Collection Time: 08/13/19  5:31 AM  Result Value Ref Range   Magnesium 2.3 1.7 - 2.4 mg/dL    Comment: Performed at [place] [address], [place], [place] [address]  Heparin level (unfractionated)     Status: Abnormal   Collection Time: 08/13/19  5:31 AM  Result Value Ref Range   Heparin Unfractionated 0.76 (H) 0.30 - 0.70 IU/mL    Comment: (NOTE) If heparin results are below expected values, and patient dosage has  been confirmed, suggest follow up testing of antithrombin III levels. Performed at [facility], [place] [address], [place], [place] [address]   Glucose, capillary     Status: Abnormal   Collection Time: 08/13/19  7:43 AM  Result Value Ref Range   Glucose-Capillary 125 (H) 70 - 99 mg/dL  Glucose, capillary     Status: Abnormal  Collection Time: 08/13/19 12:04 PM  Result Value Ref Range   Glucose-Capillary 115 (H) 70 - 99 mg/dL  Glucose, capillary     Status: Abnormal   Collection Time: 08/13/19   3:45 PM  Result Value Ref Range   Glucose-Capillary 112 (H) 70 - 99 mg/dL  Heparin level (unfractionated)     Status: None   Collection Time: 08/13/19  4:52 PM  Result Value Ref Range   Heparin Unfractionated 0.47 0.30 - 0.70 IU/mL    Comment: (NOTE) If heparin results are below expected values, and patient dosage has  been confirmed, suggest follow up testing of antithrombin III levels. Performed at [facility], [place] [address] [place], [place] [address]   Glucose, capillary     Status: Abnormal   Collection Time: 08/13/19  8:32 PM  Result Value Ref Range   Glucose-Capillary 102 (H) 70 - 99 mg/dL  I-STAT 7, (LYTES, BLD GAS, ICA, H+H)     Status: Abnormal   Collection Time: 08/13/19 10:48 PM  Result Value Ref Range   pH, Arterial 7.480 (H) 7.350 - 7.450   pCO2 arterial 35.0 32.0 - 48.0 mmHg   pO2, Arterial 66.0 (L) 83.0 - 108.0 mmHg   Bicarbonate 26.1 20.0 - 28.0 mmol/L   TCO2 27 22 - 32 mmol/L   O2 Saturation 94.0 %   Acid-Base Excess 3.0 (H) 0.0 - 2.0 mmol/L   Sodium 135 135 - 145 mmol/L   Potassium 4.7 3.5 - 5.1 mmol/L   Calcium, Ion 1.21 1.15 - 1.40 mmol/L   HCT 25.0 (L) 36.0 - 46.0 %   Hemoglobin 8.5 (L) 12.0 - 15.0 g/dL   Patient temperature HIDE    Sample type ARTERIAL   Glucose, capillary     Status: Abnormal   Collection Time: 08/13/19 11:40 PM  Result Value Ref Range   Glucose-Capillary 107 (H) 70 - 99 mg/dL  Glucose, capillary     Status: Abnormal   Collection Time: 08/14/19  3:53 AM  Result Value Ref Range   Glucose-Capillary 102 (H) 70 - 99 mg/dL  CBC     Status: Abnormal   Collection Time: 08/14/19  4:58 AM  Result Value Ref Range   WBC 22.5 (H) 4.0 - 10.5 K/uL   RBC 2.77 (L) 3.87 - 5.11 MIL/uL   Hemoglobin 8.0 (L) 12.0 - 15.0 g/dL   HCT 25.9 (L) 36.0 - 46.0 %   MCV 93.5 80.0 - 100.0 fL   MCH 28.9 26.0 - 34.0 pg   MCHC 30.9 30.0 - 36.0 g/dL   RDW 17.5 (H) 11.5 - 15.5 %   Platelets 253 150 - 400 K/uL   nRBC 0.0 0.0 - 0.2 %    Comment:  Performed at [facility], [place] [address] [place], [place] [address]  Heparin level (unfractionated)     Status: None   Collection Time: 08/14/19  4:58 AM  Result Value Ref Range   Heparin Unfractionated 0.47 0.30 - 0.70 IU/mL    Comment: (NOTE) If heparin results are below expected values, and patient dosage has  been confirmed, suggest follow up testing of antithrombin III levels. Performed at [facility], [place] [address], [place], [place] [id number]   Basic metabolic panel     Status: Abnormal   Collection Time: 08/14/19  4:58 AM  Result Value Ref Range   Sodium 136 135 - 145 mmol/L   Potassium 4.6 3.5 - 5.1 mmol/L   Chloride 101 98 - 111  mmol/L   CO2 24 22 - 32 mmol/L   Glucose, Bld 116 (H) 70 - 99 mg/dL   BUN 21 8 - 23 mg/dL   Creatinine, Ser 1.84 (H) 0.44 - 1.00 mg/dL   Calcium 8.7 (L) 8.9 - 10.3 mg/dL   GFR calc non Af Amer 27 (L) >60 mL/min   GFR calc Af Amer 31 (L) >60 mL/min   Anion gap 11 5 - 15    Comment: Performed at [facility] [address], [place], [place] [address]  Glucose, capillary     Status: Abnormal   Collection Time: 08/14/19  7:43 AM  Result Value Ref Range   Glucose-Capillary 111 (H) 70 - 99 mg/dL  Phosphorus     Status: None   Collection Time: 08/14/19 10:42 AM  Result Value Ref Range   Phosphorus 4.0 2.5 - 4.6 mg/dL    Comment: Performed at [facility], [place] [address], [place], [place] [address]  Glucose, capillary     Status: Abnormal   Collection Time: 08/14/19 11:30 AM  Result Value Ref Range   Glucose-Capillary 111 (H) 70 - 99 mg/dL    Dg Chest 1 View  Result Date: 08/13/2019 CLINICAL DATA:  Cardiac arrest.  O2 desaturation. EXAM: CHEST  1 VIEW COMPARISON:  August 10, 2019 FINDINGS: The ETT terminates in good position. The right central line is in good position. The NG tube terminates below today's film. No pneumothorax. Probable tiny effusions with atelectasis. No suspicious infiltrates, nodules, or masses.  IMPRESSION: 1. Support apparatus as above. 2. Tiny pleural effusions with underlying atelectasis. Electronically Signed   By: Dorise Bullion III M.D   On: 08/13/2019 11:04   Dg Abd 1 View  Result Date: 08/14/2019 CLINICAL DATA:  NG tube placement EXAM: ABDOMEN - 1 VIEW COMPARISON:  Chest x-ray earlier today FINDINGS: NG tube is in the right lower chest, likely within the right lung. Recommend complete removal and replacement. Airspace disease in the right lower lobe. IMPRESSION: NG tube tip in the right lower lung. Recommend complete removal and replacement. Airspace disease in the right lower lobe.  Cannot exclude aspiration These results were called by telephone at the time of interpretation on 08/14/2019 at 12:29 am to patient's nurse, who verbally acknowledged these results. Electronically Signed   By: Rolm Baptise M.D.   On: 08/14/2019 00:29   Dg Chest Port 1 View  Result Date: 08/14/2019 CLINICAL DATA:  Respiratory failure EXAM: PORTABLE CHEST 1 VIEW COMPARISON:  August 13, 2019 FINDINGS: The right-sided central venous catheter is stable in positioning. The enteric tube has been removed. There are persistent small bilateral pleural effusions, right greater than left. The heart size is stable from prior study. There is generalized volume overload with mild pulmonary edema. Bibasilar airspace opacities are again noted and are favored to represent atelectasis. There are end-stage degenerative changes of the right glenohumeral joint. There are multiple displaced left-sided rib fractures. There are multiple displaced right-sided rib fractures. IMPRESSION: 1. Lines and tubes as above. 2. Small bilateral pleural effusions, right greater than left. There are adjacent airspace opacities favored to represent atelectasis. 3. Mild pulmonary edema. 4. Again noted are multiple displaced bilateral rib fractures without evidence for pneumothorax. Electronically Signed   By: Constance Holster M.D.   On: 08/14/2019  05:52   Dg Foot 2 Views Right  Result Date: 08/13/2019 CLINICAL DATA:  Leukocytosis.  Evaluate for site of infection. EXAM: RIGHT FOOT - 2 VIEW COMPARISON:  None.  FINDINGS: There is no evidence of fracture or dislocation. There is no evidence of arthropathy or other focal bone abnormality. Soft tissues are unremarkable. IMPRESSION: No osteomyelitis identified.  No infection noted. Electronically Signed   By: Dorise Bullion III M.D   On: 08/13/2019 11:06    ROS Blood pressure (!) 117/56, pulse 75, temperature 98.6 F (37 C), temperature source Oral, resp. rate (!) 25, height 5\' 6"  (1.676 m), weight 74 kg, SpO2 99 %. General appearance: alert, appears stated age and follows commands Head: Normocephalic, without obvious abnormality, atraumatic Eyes: negative Nose: Nares normal. Septum midline. Mucosa normal. No drainage or sinus tenderness. Throat: Edentulous maxilla, not wearing denture. Closes mandible on command, full range of motion.Marland Kitchen Pharynx clear. Neck: no adenopathy  Assessment/Plan: Normal jaw opening and closing. No further treatment needed.   Jaclyn Day 08/14/2019, 2:47 PM

## 2019-08-14 NOTE — Progress Notes (Signed)
pts order is for BIPAP PRN at this time. Pts respiratory status is stable at this time on HHFNC 15lpm 40% w/sats of 99%. RT will continue to monitor.

## 2019-08-14 NOTE — Progress Notes (Signed)
[place] for Heparin Indication: atrial fibrillation  Allergies  Allergen Reactions  . Codeine Other (See Comments)    Reaction not recalled by family- was told to never take this    Patient Measurements: Height: 5\' 6"  (167.6 cm) Weight: 163 lb 2.3 oz (74 kg) IBW/kg (Calculated) : 59.3 Heparin Dosing Weight: 78 kg  Vital Signs: Temp: 98.6 F (37 C) (11/02 0700) Temp Source: Oral (11/02 0700) BP: 125/57 (11/02 0700) Pulse Rate: 79 (11/02 0700)  Labs: Recent Labs    08/12/19 0305  08/12/19 1606  08/13/19 0531 08/13/19 1652 08/13/19 2248 08/14/19 0458  HGB 9.1*  --   --   --  9.1*  --  8.5* 8.0*  HCT 28.6*  --   --   --  29.9*  --  25.0* 25.9*  PLT 257  --   --   --  251  --   --  253  HEPARINUNFRC 0.81*   < >  --    < > 0.76* 0.47  --  0.47  CREATININE 1.53*  --  1.15*  --  0.79  0.90  --   --  1.84*   < > = values in this interval not displayed.    Estimated Creatinine Clearance: 28.4 mL/min (A) (by C-G formula based on SCr of 1.84 mg/dL (H)).  Assessment: Pt is a 73 y/o female admitted s/p out of hospital cardiac arrest. ROSC was achieved and targeted temperature management was initiated. Pt has a history of A-fib (taking eliquis PTA), diastolic heart failure, HTN, HDL, IDDM, CKD. Pharmacy has been consulted to dose heparin for A-fib.  Heparin level this morning remains therapeutic; no overt bleeding or complications noted although Hgb slightly down this AM.  Platelet count stable.  Goal of Therapy:  Heparin level 0.3-0.7 units/ml Monitor platelets by anticoagulation protocol: Yes   Plan:  Continue heparin gtt at 1250 units/hr Daily heparin level and CBC.  Marguerite Olea, [facility] Clinical Pharmacist Phone [phone]  08/14/2019 7:52 AM
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
## 2019-08-14 NOTE — Progress Notes (Addendum)
Progress Note  Patient Name: Jaclyn Day Date of Encounter: 08/14/2019 Primary Cardiologist: Ena Dawley, MD   Subjective   O/N events: Patient was extubated yesterday, currently on 15L HFNC.   Mrs. Lowy was seen at bedside, she stated that she wanted to go home. She denied any pain or other issues today.   Inpatient Medications    Scheduled Meds:  amiodarone  200 mg Per Tube Daily   ARIPiprazole  10 mg Per Tube BID   atorvastatin  20 mg Per NG tube Daily   chlorhexidine  15 mL Mouth Rinse BID   Chlorhexidine Gluconate Cloth  6 each Topical Daily   insulin aspart  0-9 Units Subcutaneous Q4H   mouth rinse  15 mL Mouth Rinse q12n4p   pantoprazole (PROTONIX) IV  40 mg Intravenous Q24H   sodium chloride flush  10-40 mL Intracatheter Q12H   Thrombi-Pad  1 each Topical Once   Continuous Infusions:  sodium chloride Stopped (08/14/19 0635)    ceFAZolin (ANCEF) IV Stopped (08/14/19 0439)   dexmedetomidine (PRECEDEX) IV infusion Stopped (08/14/19 0223)   heparin 1,250 Units/hr (08/14/19 0700)   norepinephrine (LEVOPHED) Adult infusion Stopped (08/13/19 1411)   valproate sodium 52.5 mL/hr at 08/14/19 0700   PRN Meds: acetaminophen (TYLENOL) oral liquid 160 mg/5 mL, fentaNYL (SUBLIMAZE) injection, hydrALAZINE, sodium chloride flush   Vital Signs    Vitals:   08/14/19 0500 08/14/19 0600 08/14/19 0700 08/14/19 0753  BP: 111/60 (!) 120/58 (!) 125/57   Pulse: 77 79 79 88  Resp: (!) 29 (!) 35 (!) 37 (!) 47  Temp:   98.6 F (37 C)   TempSrc:   Oral   SpO2: 97% 93% 96% 93%  Weight: 74 kg     Height:        Intake/Output Summary (Last 24 hours) at 08/14/2019 0829 Last data filed at 08/14/2019 0700 Gross per 24 hour  Intake 1931.56 ml  Output 960 ml  Net 971.56 ml   Filed Weights   08/12/19 0400 08/13/19 0615 08/14/19 0500  Weight: 79.1 kg 75.2 kg 74 kg    Telemetry    NSR - Personally Reviewed  ECG    None today   Physical Exam    General: alert, cooperative and no distress HEENT: sclera clear, anicteric Heart: S1, S2 normal, no murmur, rub or gallop, regular rate and rhythm Lungs: rhonchi and increased work of breathing Abdomen: abdomen is soft without significant tenderness, masses, organomegaly or guarding Extremities: extremities normal, atraumatic, no cyanosis or edema Neurology: normal without focal findings Psychiatry: Normal mood and affect  Labs    Chemistry Recent Labs  Lab 08/12/19 0305 08/12/19 1606 08/13/19 0531 08/13/19 2248 08/14/19 0458  NA 138 137 137   136 135 136  K 4.2 4.4 4.3   4.3 4.7 4.6  CL 101 101 100   100  --  101  CO2 25 27 27   26   --  24  GLUCOSE 120* 131* 138*   137*  --  116*  BUN 26* 15 11   10   --  21  CREATININE 1.53* 1.15* 0.79   0.90  --  1.84*  CALCIUM 8.5* 8.1* 8.6*   8.7*  --  8.7*  ALBUMIN 1.9* 1.8* 2.1*  --   --   GFRNONAA 34* 47* >60   >60  --  27*  GFRAA 39* 55* >60   >60  --  31*  ANIONGAP 12 9 10   10   --  11     Hematology Recent Labs  Lab 08/12/19 0305 08/13/19 0531 08/13/19 2248 08/14/19 0458  WBC 21.6* 29.0*  --  22.5*  RBC 3.18* 3.21*  --  2.77*  HGB 9.1* 9.1* 8.5* 8.0*  HCT 28.6* 29.9* 25.0* 25.9*  MCV 89.9 93.1  --  93.5  MCH 28.6 28.3  --  28.9  MCHC 31.8 30.4  --  30.9  RDW 17.2* 17.4*  --  17.5*  PLT 257 251  --  253    Cardiac EnzymesNo results for input(s): TROPONINI in the last 168 hours. No results for input(s): TROPIPOC in the last 168 hours.   BNPNo results for input(s): BNP, PROBNP in the last 168 hours.   DDimer No results for input(s): DDIMER in the last 168 hours.   Radiology    Dg Chest 1 View  Result Date: 08/13/2019 CLINICAL DATA:  Cardiac arrest.  O2 desaturation. EXAM: CHEST  1 VIEW COMPARISON:  August 10, 2019 FINDINGS: The ETT terminates in good position. The right central line is in good position. The NG tube terminates below today's film. No pneumothorax. Probable tiny effusions with atelectasis. No  suspicious infiltrates, nodules, or masses. IMPRESSION: 1. Support apparatus as above. 2. Tiny pleural effusions with underlying atelectasis. Electronically Signed   By: Dorise Bullion III M.D   On: 08/13/2019 11:04   Dg Abd 1 View  Result Date: 08/14/2019 CLINICAL DATA:  NG tube placement EXAM: ABDOMEN - 1 VIEW COMPARISON:  Chest x-ray earlier today FINDINGS: NG tube is in the right lower chest, likely within the right lung. Recommend complete removal and replacement. Airspace disease in the right lower lobe. IMPRESSION: NG tube tip in the right lower lung. Recommend complete removal and replacement. Airspace disease in the right lower lobe.  Cannot exclude aspiration These results were called by telephone at the time of interpretation on 08/14/2019 at 12:29 am to patient's nurse, who verbally acknowledged these results. Electronically Signed   By: Rolm Baptise M.D.   On: 08/14/2019 00:29   Dg Chest Port 1 View  Result Date: 08/14/2019 CLINICAL DATA:  Respiratory failure EXAM: PORTABLE CHEST 1 VIEW COMPARISON:  August 13, 2019 FINDINGS: The right-sided central venous catheter is stable in positioning. The enteric tube has been removed. There are persistent small bilateral pleural effusions, right greater than left. The heart size is stable from prior study. There is generalized volume overload with mild pulmonary edema. Bibasilar airspace opacities are again noted and are favored to represent atelectasis. There are end-stage degenerative changes of the right glenohumeral joint. There are multiple displaced left-sided rib fractures. There are multiple displaced right-sided rib fractures. IMPRESSION: 1. Lines and tubes as above. 2. Small bilateral pleural effusions, right greater than left. There are adjacent airspace opacities favored to represent atelectasis. 3. Mild pulmonary edema. 4. Again noted are multiple displaced bilateral rib fractures without evidence for pneumothorax. Electronically Signed   By:  Constance Holster M.D.   On: 08/14/2019 05:52   Dg Foot 2 Views Right  Result Date: 08/13/2019 CLINICAL DATA:  Leukocytosis.  Evaluate for site of infection. EXAM: RIGHT FOOT - 2 VIEW COMPARISON:  None. FINDINGS: There is no evidence of fracture or dislocation. There is no evidence of arthropathy or other focal bone abnormality. Soft tissues are unremarkable. IMPRESSION: No osteomyelitis identified.  No infection noted. Electronically Signed   By: Dorise Bullion III M.D   On: 08/13/2019 11:06    Cardiac Studies   TTE 08/03/19 1. Left ventricular  ejection fraction, by visual estimation, is 20 to 25%. The left ventricle has severely decreased function. Normal left ventricular size. There is moderately increased left ventricular hypertrophy. Severe global hypokinesis. 2. Left ventricular diastolic function could not be evaluated pattern of LV diastolic filling. 3. Global right ventricle has mildly reduced systolic function.The right ventricular size is normal. No increase in right ventricular wall thickness. 4. Left atrial size was severely dilated. 5. Right atrial size was normal. 6. The mitral valve is abnormal. Mild mitral valve regurgitation. 7. The tricuspid valve is grossly normal. Tricuspid valve regurgitation is trivial. 8. The aortic valve is tricuspid Aortic valve regurgitation was not visualized by color flow Doppler. 9. The pulmonic valve was grossly normal. Pulmonic valve regurgitation is not visualized by color flow Doppler. 10. Normal pulmonary artery systolic pressure. 11. The inferior vena cava is normal in size with <50% respiratory variability, suggesting right atrial pressure of 8 mmHg. (on vent) 12. The interatrial septum was not well visualized.  TEE 08/09/19 IMPRESSION:   1. Small filamentous mass on the aortic side of the left coronary cusp, most consistent with vegetation given the clinical picture of staph bacteremia. 2. No LAA thrombus 3. Negative for  PFO by color doppler 4. LVEF improved to 55-60% - possible mild anterior hypokinesis.   Patient Profile     73 y.o. female with a history of HFrEF, HTN, paroxysmal A fib, IDDM, CKD stage 4 s/p nephrectomy who presented after a out of hospital arrest, found to have staph bacteremia/endocarditis, respiratory failure, renal failure, and atrial fibrillation.   Assessment & Plan    1. Atrial fibrillation: Currently rate controlled in NSR, is on amiodarone 200 mg daily. Continue this.  2. VDRF: S/p extubation 07/14/19. Currently on 15 L HF [place]. Oxygenating well.  3. Acute on chronic systolic heart failure: TTE on 10/22 showed decreased EF of 20-25%. TEE on 10/28 showed EF improved to 55-60% with anterior hypokinesis.   4. SBE: Currently on a prolonged course of antibiotics, cefazolin. On review of TEE it's unclear if there is a vegetation vs a lambls excrescences. End date of antibiotics 6 weeks from last negative culture.  5. AoCKD: Still oliguric, currently on iHD. Nephro following.   For questions or updates, please contact [place] Please consult [email or web address] for contact info under Cardiology/STEMI.   Signed, Asencion Noble, MD  08/14/2019, 8:29 AM     I have seen and examined the patient along with Asencion Noble, MD.   I have reviewed the chart, notes and new data.  I agree with her note.  Key new complaints: initial arrest seems to have been respiratory (preceded by several days of dyspnea at rest, PEA on initial evaluation, never was defibrillated). Key examination changes: Still has some respiratory difficulty, but appears comfortable at rest. Key new findings / data: I have reviewed the TEE.  In any other circumstances I would assume that the abnormalities on the aortic valve just represent Lambl's excrescences (normal variant).  They do not appear consistent with the usual vegetation.  There is no evidence of aortic insufficiency.  PLAN: The TEE is definitely not  convincing for vegetations.  However in the current clinical scenario, with staph aureus bacteremia, I think it is most reasonable to treat as if she has endocarditis. Continue intravenous antibiotics for a total of 6 weeks.  Needs to undergo an evaluation for coronary artery disease, but this is not possible at this time due to acute renal failure.  I do  not think a nuclear stress test would suffice.  Whenever renal function returns to baseline consider coronary CT angiography or conventional angiography with limited contrast use.  With improved left ventricular systolic function and in the absence of ventricular arrhythmia, LifeVest is not indicated.  On amiodarone and intravenous heparin for paroxysmal atrial fibrillation.  Depending on renal function recovery, transition back to direct oral anticoagulant  Sanda Klein, MD, [facility] [phone] 08/14/2019, 9:48 AM

## 2019-08-14 NOTE — Progress Notes (Addendum)
  This RN attempting to insert NG tube via right and left nares. Each time met resistance about 10 cm into insertion and attempts caused mild epistaxis.   Dr. Loanne Drilling aware, instructed to hold heparin gtt for now d/t bleeding
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
## 2019-08-14 NOTE — Progress Notes (Signed)
[facility]PHROLOG[facility] PROGRESS NOTE  Assessment/ Plan: Pt is a 73 y.o. yo female with out of hospital cardiac arrest CPR for 10 minutes, history of CKD stage IV, CHF.  She was a started dialysis on 10/25 via right IJ catheter.  Urine culture with E. coli, CRRT initiated on 10/30/120.  #AKI on CKD stage IV: CKD due to hypertension.  AKI likely ATN in the setting of shock, cardiac arrest and bacteremia.  She was on CRRT since 10/30-11/1.  CRRT held yesterday because of hypotension.  Monitor urine output and blood pressure.  Daily evaluation for dialysis need.  Hold off on CRRT today. -Kidney ultrasound showed solitary right kidney with cortical thinning and cysts.  #Cardiac arrest, acute on chronic systolic CHF: EF [id number] by echo, cardiology is following.  EF improved in TEE.  #MSSA bacteremia: Continue cefazolin. End date six weeks from last negative culture 08/07/19 - 09/18/19.  #Hypotension: Holding CRRT.  Pressors as needed.  #Encephalopathy: On Precedex for agitation.  #Acute respiratory failure with hypoxia: Pulmonary is following.  #Paroxysmal atrial fibrillation: Cardiology following.  On amiodarone and heparin drip.  Subjective: Seen and examined ICU.  On high flow oxygen.  Patient is quite sedated.  Of CRRT since yesterday because of hypotension. Objective Vital signs in last 24 hours: Vitals:   08/14/19 0500 08/14/19 0600 08/14/19 0700 08/14/19 0753  BP: 111/60 (!) 120/58 (!) 125/57   Pulse: 77 79 79 88  Resp: (!) 29 (!) 35 (!) 37 (!) 47  Temp:   98.6 F (37 C)   TempSrc:   Oral   SpO2: 97% 93% 96% 93%  Weight: 74 kg     Height:       Weight change: -1.2 kg  Intake/Output Summary (Last 24 hours) at 08/14/2019 0759 Last data filed at 08/14/2019 0700 Gross per 24 hour  Intake 2013.94 ml  Output 1370 ml  Net 643.94 ml       Labs: Basic Metabolic Panel: Recent Labs  Lab 08/12/19 0305 08/12/19 1606 08/13/19 0531 08/13/19 2248 08/14/19 0458  NA  138 137 137  136 135 136  K 4.2 4.4 4.3  4.3 4.7 4.6  CL 101 101 100  100  --  101  CO2 25 27 27  26   --  24  GLUCOSE 120* 131* 138*  137*  --  116*  BUN 26* 15 11  10   --  21  CREATININE 1.53* 1.15* 0.79  0.90  --  1.84*  CALCIUM 8.5* 8.1* 8.6*  8.7*  --  8.7*  PHOS 1.7* 2.4* 1.5*  --   --    Liver Function Tests: Recent Labs  Lab 08/12/19 0305 08/12/19 1606 08/13/19 0531  ALBUMIN 1.9* 1.8* 2.1*   No results for input(s): LIPASE, AMYLASE in the last 168 hours. No results for input(s): AMMONIA in the last 168 hours. CBC: Recent Labs  Lab 08/08/19 0320 08/09/19 0327 08/10/19 0437 08/11/19 0730 08/12/19 0305 08/13/19 0531 08/13/19 2248 08/14/19 0458  WBC 10.3 13.8* 18.9* 18.3* 21.6* 29.0*  --  22.5*  NEUTROABS 7.5 10.4* 14.6*  --   --   --   --   --   HGB 8.3* 8.0* 8.5* 8.4* 9.1* 9.1* 8.5* 8.0*  HCT 26.7* 25.6* 27.2* 26.4* 28.6* 29.9* 25.0* 25.9*  MCV 93.4 92.1 92.5 89.8 89.9 93.1  --  93.5  PLT 137* 140* 182 186 257 251  --  253   Cardiac Enzymes: No results for input(s): CKTOTAL, CKMB, CKMBINDEX,  TROPONINI in the last 168 hours. CBG: Recent Labs  Lab 08/13/19 1545 08/13/19 2032 08/13/19 2340 08/14/19 0353 08/14/19 0743  GLUCAP 112* 102* 107* 102* 111*    Iron Studies: No results for input(s): IRON, TIBC, TRANSFERRIN, FERRITIN in the last 72 hours. Studies/Results: Dg Chest 1 View  Result Date: 08/13/2019 CLINICAL DATA:  Cardiac arrest.  O2 desaturation. EXAM: CHEST  1 VIEW COMPARISON:  August 10, 2019 FINDINGS: The ETT terminates in good position. The right central line is in good position. The NG tube terminates below today's film. No pneumothorax. Probable tiny effusions with atelectasis. No suspicious infiltrates, nodules, or masses. IMPRESSION: 1. Support apparatus as above. 2. Tiny pleural effusions with underlying atelectasis. Electronically Signed   By: Dorise Bullion III M.D   On: 08/13/2019 11:04   Dg Abd 1 View  Result Date:  08/14/2019 CLINICAL DATA:  NG tube placement EXAM: ABDOMEN - 1 VIEW COMPARISON:  Chest x-ray earlier today FINDINGS: NG tube is in the right lower chest, likely within the right lung. Recommend complete removal and replacement. Airspace disease in the right lower lobe. IMPRESSION: NG tube tip in the right lower lung. Recommend complete removal and replacement. Airspace disease in the right lower lobe.  Cannot exclude aspiration These results were called by telephone at the time of interpretation on 08/14/2019 at 12:29 am to patient's nurse, who verbally acknowledged these results. Electronically Signed   By: Rolm Baptise M.D.   On: 08/14/2019 00:29   Dg Chest Port 1 View  Result Date: 08/14/2019 CLINICAL DATA:  Respiratory failure EXAM: PORTABLE CHEST 1 VIEW COMPARISON:  August 13, 2019 FINDINGS: The right-sided central venous catheter is stable in positioning. The enteric tube has been removed. There are persistent small bilateral pleural effusions, right greater than left. The heart size is stable from prior study. There is generalized volume overload with mild pulmonary edema. Bibasilar airspace opacities are again noted and are favored to represent atelectasis. There are end-stage degenerative changes of the right glenohumeral joint. There are multiple displaced left-sided rib fractures. There are multiple displaced right-sided rib fractures. IMPRESSION: 1. Lines and tubes as above. 2. Small bilateral pleural effusions, right greater than left. There are adjacent airspace opacities favored to represent atelectasis. 3. Mild pulmonary edema. 4. Again noted are multiple displaced bilateral rib fractures without evidence for pneumothorax. Electronically Signed   By: Constance Holster M.D.   On: 08/14/2019 05:52   Dg Foot 2 Views Right  Result Date: 08/13/2019 CLINICAL DATA:  Leukocytosis.  Evaluate for site of infection. EXAM: RIGHT FOOT - 2 VIEW COMPARISON:  None. FINDINGS: There is no evidence of fracture  or dislocation. There is no evidence of arthropathy or other focal bone abnormality. Soft tissues are unremarkable. IMPRESSION: No osteomyelitis identified.  No infection noted. Electronically Signed   By: Dorise Bullion III M.D   On: 08/13/2019 11:06    Medications: Infusions: . sodium chloride Stopped (08/14/19 [id number])  .  ceFAZolin (ANCEF) IV Stopped (08/14/19 0439)  . dexmedetomidine (PRECEDEX) IV infusion Stopped (08/14/19 0223)  . heparin 1,250 Units/hr (08/14/19 0700)  . norepinephrine (LEVOPHED) Adult infusion Stopped (08/13/19 1411)  . valproate sodium 52.5 mL/hr at 08/14/19 0700    Scheduled Medications: . amiodarone  200 mg Per Tube Daily  . ARIPiprazole  10 mg Per Tube BID  . atorvastatin  20 mg Per NG tube Daily  . chlorhexidine  15 mL Mouth Rinse BID  . Chlorhexidine Gluconate Cloth  6 each Topical Daily  .  insulin aspart  0-9 Units Subcutaneous Q4H  . mouth rinse  15 mL Mouth Rinse q12n4p  . pantoprazole (PROTONIX) IV  40 mg Intravenous Q24H  . sodium chloride flush  10-40 mL Intracatheter Q12H  . Thrombi-Pad  1 each Topical Once    have reviewed scheduled and prn medications.  Physical Exam: General: Lethargic female on oxygen, Heart:RRR, s1s2 nl Lungs: Basal crackles, no wheezing Abdomen:soft, Non-tender, non-distended Extremities:No edema Dialysis Access: Right IJ temporary catheter  Dron Prasad Bhandari 08/14/2019,7:59 AM  LOS: 11 days  Pager: ID:[phone]

## 2019-08-14 NOTE — Evaluation (Signed)
Clinical/Bedside Swallow Evaluation Patient Details  Name: Jaclyn Day MRN: [id number] Date of Birth: 1946-06-14  Today's Date: 08/14/2019 Time: SLP Start Time (ACUTE ONLY): [id number] SLP Stop Time (ACUTE ONLY): 0724 SLP Time Calculation (min) (ACUTE ONLY): 11 min  Past Medical History:  Past Medical History:  Diagnosis Date  . Anemia   . Chronic diastolic CHF (congestive heart failure) ([place])   . CKD (chronic kidney disease), stage IV ([place])   . Hypertension   . Persistent atrial fibrillation ([place])    a. dx 01/2019 s/p TEE DCCV.  Marland Kitchen Renal disorder   . Unilateral congenital absence of kidney    Past Surgical History:  Past Surgical History:  Procedure Laterality Date  . CARDIOVERSION N/A 01/31/2019   Procedure: CARDIOVERSION;  Surgeon: Lelon Perla, MD;  Location: [facility] ENDOSCOPY;  Service: Cardiovascular;  Laterality: N/A;  . TEE WITHOUT CARDIOVERSION N/A 01/31/2019   Procedure: TRANSESOPHAGEAL ECHOCARDIOGRAM (TEE);  Surgeon: Lelon Perla, MD;  Location: [facility] ENDOSCOPY;  Service: Cardiovascular;  Laterality: N/A;   HPI:  Pt is a 73 yo F admitted for witnessed out of hospital cardiac arrest, unclear etiology, with field ROSC, resultant respiratory failure and encephalopathy s/p TTM; ETT 10/22-11/1. CRRT initiated 10/30. PMH: unilateral congenital absence of kidney, Afib, HTN, CKD, CHF, anemia, mood disorder   Assessment / Plan / Recommendation Clinical Impression  Pt is at a high risk for aspiration given prolonged intubation, respiratory status, and altered mentation. Today she keeps her mandible in an open position - resisting closure when given verbal/tactile cues to attempt. No vocalizations were heard by SLP despite cueing, but RN reports that she has been very hoarse. RR is in the mid-high 30s at rest. Oral acceptance is completely passive and pt makes no attempts to close her lips or move her tongue, with SLP removing all POs via yankauer from her anterior sulcus. Pt is not  ready for POs at this point - would consider alternative source for meds. SLP will f/u for potential to advance diet with additional time post-extubation and pending improvements in mentation.  SLP Visit Diagnosis: Dysphagia, unspecified (R13.10)    Aspiration Risk  Severe aspiration risk    Diet Recommendation NPO;Alternative means - temporary   Medication Administration: Via alternative means    Other  Recommendations Oral Care Recommendations: Oral care QID Other Recommendations: Have oral suction available   Follow up Recommendations (tba)      Frequency and Duration min 2x/week  2 weeks       Prognosis Prognosis for Safe Diet Advancement: Good Barriers to Reach Goals: Cognitive deficits      Swallow Study   General HPI: Pt is a 73 yo F admitted for witnessed out of hospital cardiac arrest, unclear etiology, with field ROSC, resultant respiratory failure and encephalopathy s/p TTM; ETT 10/22-11/1. CRRT initiated 10/30. PMH: unilateral congenital absence of kidney, Afib, HTN, CKD, CHF, anemia, mood disorder Type of Study: Bedside Swallow Evaluation Previous Swallow Assessment: none in chart Diet Prior to this Study: NPO Temperature Spikes Noted: No Respiratory Status: Nasal cannula History of Recent Intubation: Yes Length of Intubations (days): 11 days Date extubated: 08/13/19 Behavior/Cognition: Alert;Requires cueing Oral Cavity Assessment: Dry Oral Care Completed by SLP: Yes Oral Cavity - Dentition: Dentures, not available;Other (Comment)(has some lower dentition) Self-Feeding Abilities: Total assist Patient Positioning: Upright in bed Baseline Vocal Quality: Not observed Volitional Swallow: Unable to elicit    Oral/Motor/Sensory Function Overall Oral Motor/Sensory Function: (difficult to assess - appears to have generalized weakness)  Ice Chips Ice chips: Not tested   Thin Liquid Thin Liquid: Impaired Presentation: Straw(swab) Oral Phase Impairments: Poor  awareness of bolus;Reduced labial seal;Reduced lingual movement/coordination Oral Phase Functional Implications: Other (comment)(no manipulation or transit)    Nectar Thick Nectar Thick Liquid: Not tested   Honey Thick Honey Thick Liquid: Not tested   Puree Puree: Impaired Presentation: Spoon Oral Phase Impairments: Reduced labial seal;Reduced lingual movement/coordination;Poor awareness of bolus Oral Phase Functional Implications: Other (comment)(no manipulation or transit)   Solid     Solid: Not tested      Venita Sheffield Lamyra Malcolm 08/14/2019,7:28 AM   [name], M.A. [place] Acute Environmental education officer [phone] Office [phone]

## 2019-08-15 ENCOUNTER — Inpatient Hospital Stay (HOSPITAL_COMMUNITY): Payer: 59

## 2019-08-15 DIAGNOSIS — D72829 Elevated white blood cell count, unspecified: Secondary | ICD-10-CM

## 2019-08-15 DIAGNOSIS — I469 Cardiac arrest, cause unspecified: Secondary | ICD-10-CM | POA: Diagnosis not present

## 2019-08-15 DIAGNOSIS — I358 Other nonrheumatic aortic valve disorders: Secondary | ICD-10-CM | POA: Diagnosis not present

## 2019-08-15 DIAGNOSIS — B9561 Methicillin susceptible Staphylococcus aureus infection as the cause of diseases classified elsewhere: Secondary | ICD-10-CM | POA: Diagnosis not present

## 2019-08-15 DIAGNOSIS — R5383 Other fatigue: Secondary | ICD-10-CM

## 2019-08-15 DIAGNOSIS — R7881 Bacteremia: Secondary | ICD-10-CM | POA: Diagnosis not present

## 2019-08-15 DIAGNOSIS — I4891 Unspecified atrial fibrillation: Secondary | ICD-10-CM | POA: Diagnosis not present

## 2019-08-15 DIAGNOSIS — J9601 Acute respiratory failure with hypoxia: Secondary | ICD-10-CM | POA: Diagnosis not present

## 2019-08-15 LAB — HEPATIC FUNCTION PANEL
ALT: <5 U/L (ref 0–44)
AST: 16 U/L (ref 15–41)
Albumin: 1.9 g/dL — ABNORMAL LOW (ref 3.5–5.0)
Alkaline Phosphatase: 97 U/L (ref 38–126)
Bilirubin, Direct: <0.1 mg/dL (ref 0.0–0.2)
Total Bilirubin: 0.7 mg/dL (ref 0.3–1.2)
Total Protein: 5.5 g/dL — ABNORMAL LOW (ref 6.5–8.1)

## 2019-08-15 LAB — CULTURE, BLOOD (ROUTINE X 2)
Culture: NO GROWTH
Culture: NO GROWTH
Special Requests: ADEQUATE
Special Requests: ADEQUATE

## 2019-08-15 LAB — POCT I-STAT 7, (LYTES, BLD GAS, ICA,H+H)
Acid-Base Excess: 3 mmol/L — ABNORMAL HIGH (ref 0.0–2.0)
Bicarbonate: 27.3 mmol/L (ref 20.0–28.0)
Calcium, Ion: 1.16 mmol/L (ref 1.15–1.40)
HCT: 25 % — ABNORMAL LOW (ref 36.0–46.0)
Hemoglobin: 8.5 g/dL — ABNORMAL LOW (ref 12.0–15.0)
O2 Saturation: 99 %
Patient temperature: 98.1
Potassium: 3.8 mmol/L (ref 3.5–5.1)
Sodium: 134 mmol/L — ABNORMAL LOW (ref 135–145)
TCO2: 28 mmol/L (ref 22–32)
pCO2 arterial: 37.4 mmHg (ref 32.0–48.0)
pH, Arterial: 7.471 — ABNORMAL HIGH (ref 7.350–7.450)
pO2, Arterial: 113 mmHg — ABNORMAL HIGH (ref 83.0–108.0)

## 2019-08-15 LAB — BASIC METABOLIC PANEL
Anion gap: 11 (ref 5–15)
BUN: 33 mg/dL — ABNORMAL HIGH (ref 8–23)
CO2: 24 mmol/L (ref 22–32)
Calcium: 8.8 mg/dL — ABNORMAL LOW (ref 8.9–10.3)
Chloride: 104 mmol/L (ref 98–111)
Creatinine, Ser: 2.72 mg/dL — ABNORMAL HIGH (ref 0.44–1.00)
GFR calc Af Amer: 19 mL/min — ABNORMAL LOW (ref >60)
GFR calc non Af Amer: 17 mL/min — ABNORMAL LOW (ref >60)
Glucose, Bld: 102 mg/dL — ABNORMAL HIGH (ref 70–99)
Potassium: 4.7 mmol/L (ref 3.5–5.1)
Sodium: 139 mmol/L (ref 135–145)

## 2019-08-15 LAB — CBC
HCT: 26.1 % — ABNORMAL LOW (ref 36.0–46.0)
Hemoglobin: 7.8 g/dL — ABNORMAL LOW (ref 12.0–15.0)
MCH: 28.6 pg (ref 26.0–34.0)
MCHC: 29.9 g/dL — ABNORMAL LOW (ref 30.0–36.0)
MCV: 95.6 fL (ref 80.0–100.0)
Platelets: 287 10*3/uL (ref 150–400)
RBC: 2.73 MIL/uL — ABNORMAL LOW (ref 3.87–5.11)
RDW: 17.7 % — ABNORMAL HIGH (ref 11.5–15.5)
WBC: 25.7 10*3/uL — ABNORMAL HIGH (ref 4.0–10.5)
nRBC: 0 % (ref 0.0–0.2)

## 2019-08-15 LAB — GLUCOSE, CAPILLARY
Glucose-Capillary: 101 mg/dL — ABNORMAL HIGH (ref 70–99)
Glucose-Capillary: 101 mg/dL — ABNORMAL HIGH (ref 70–99)
Glucose-Capillary: 111 mg/dL — ABNORMAL HIGH (ref 70–99)
Glucose-Capillary: 112 mg/dL — ABNORMAL HIGH (ref 70–99)
Glucose-Capillary: 82 mg/dL (ref 70–99)
Glucose-Capillary: 99 mg/dL (ref 70–99)

## 2019-08-15 LAB — PHOSPHORUS
Phosphorus: 5.2 mg/dL — ABNORMAL HIGH (ref 2.5–4.6)
Phosphorus: 3 mg/dL (ref 2.5–4.6)

## 2019-08-15 LAB — MAGNESIUM
Magnesium: 2.5 mg/dL — ABNORMAL HIGH (ref 1.7–2.4)
Magnesium: 1.9 mg/dL (ref 1.7–2.4)

## 2019-08-15 LAB — HEPARIN LEVEL (UNFRACTIONATED)
Heparin Unfractionated: <0.1 IU/mL — ABNORMAL LOW (ref 0.30–0.70)
Heparin Unfractionated: 0.61 IU/mL (ref 0.30–0.70)

## 2019-08-15 LAB — TSH: TSH: 3.554 u[IU]/mL (ref 0.350–4.500)

## 2019-08-15 LAB — VALPROIC ACID LEVEL: Valproic Acid Lvl: 29 ug/mL — ABNORMAL LOW (ref 50.0–100.0)

## 2019-08-15 MED ORDER — PENTAFLUOROPROP-TETRAFLUOROETH EX AERO: 1.0000 "application " | INHALATION_SPRAY | CUTANEOUS | Status: DC | PRN

## 2019-08-15 MED ORDER — LIDOCAINE HCL (PF) 1 % IJ SOLN: 5.0000 mL | INTRAMUSCULAR | Status: DC | PRN

## 2019-08-15 MED ORDER — NOREPINEPHRINE 4 MG/250ML-% IV SOLN
0.0000 ug/min | INTRAVENOUS | Status: DC
  Administered 2019-08-15: 2 ug/min via INTRAVENOUS

## 2019-08-15 MED ORDER — NOREPINEPHRINE 4 MG/250ML-% IV SOLN
INTRAVENOUS | Status: AC
  Filled 2019-08-15: qty 250

## 2019-08-15 MED ORDER — HEPARIN SODIUM (PORCINE) 1000 UNIT/ML IJ SOLN
INTRAMUSCULAR | Status: AC
  Administered 2019-08-15: 1000 [IU] via INTRAVENOUS_CENTRAL
  Filled 2019-08-15: qty 4

## 2019-08-15 MED ORDER — SODIUM CHLORIDE 0.9 % IV SOLN: 100.0000 mL | INTRAVENOUS | Status: DC | PRN

## 2019-08-15 MED ORDER — CHLORHEXIDINE GLUCONATE CLOTH 2 % EX PADS
6.0000 | MEDICATED_PAD | Freq: Every day | CUTANEOUS | Status: DC
  Administered 2019-08-17 – 2019-09-05 (×7): 6 via TOPICAL

## 2019-08-15 MED ORDER — DILTIAZEM HCL-DEXTROSE 125-5 MG/125ML-% IV SOLN (PREMIX)
5.0000 mg/h | INTRAVENOUS | Status: DC
  Administered 2019-08-15: 5 mg/h via INTRAVENOUS
  Filled 2019-08-15: qty 125

## 2019-08-15 MED ORDER — ALTEPLASE 2 MG IJ SOLR: 2.0000 mg | Freq: Once | INTRAMUSCULAR | Status: DC | PRN

## 2019-08-15 MED ORDER — OSMOLITE 1.5 CAL PO LIQD
1000.0000 mL | ORAL | Status: DC
  Administered 2019-08-15 – 2019-08-24 (×10): 1000 mL
  Filled 2019-08-15 (×14): qty 1000

## 2019-08-15 MED ORDER — LIDOCAINE-PRILOCAINE 2.5-2.5 % EX CREA
1.0000 "application " | TOPICAL_CREAM | CUTANEOUS | Status: DC | PRN
  Filled 2019-08-15: qty 5

## 2019-08-15 MED ORDER — ORAL CARE MOUTH RINSE
15.0000 mL | Freq: Two times a day (BID) | OROMUCOSAL | Status: DC
  Administered 2019-08-16 – 2019-09-24 (×49): 15 mL via OROMUCOSAL

## 2019-08-15 MED ORDER — PRO-STAT SUGAR FREE PO LIQD
30.0000 mL | Freq: Every day | ORAL | Status: DC
  Administered 2019-08-15 – 2019-08-25 (×11): 30 mL
  Filled 2019-08-15 (×11): qty 30

## 2019-08-15 MED ORDER — CEFAZOLIN SODIUM-DEXTROSE 2-4 GM/100ML-% IV SOLN
2.0000 g | Freq: Every day | INTRAVENOUS | Status: DC
  Administered 2019-08-15 – 2019-08-19 (×5): 2 g via INTRAVENOUS
  Filled 2019-08-15 (×6): qty 100

## 2019-08-15 MED ORDER — DILTIAZEM LOAD VIA INFUSION
10.0000 mg | Freq: Once | INTRAVENOUS | Status: AC
  Administered 2019-08-15: 10 mg via INTRAVENOUS
  Filled 2019-08-15: qty 10

## 2019-08-15 MED ORDER — HEPARIN SODIUM (PORCINE) 1000 UNIT/ML DIALYSIS
1000.0000 [IU] | INTRAMUSCULAR | Status: DC | PRN
  Administered 2019-08-15: 14:00:00 1000 [IU] via INTRAVENOUS_CENTRAL
  Administered 2019-08-24 – 2019-09-19 (×5): 3200 [IU] via INTRAVENOUS_CENTRAL
  Filled 2019-08-15 (×7): qty 1

## 2019-08-15 NOTE — Progress Notes (Signed)
PHARMACY NOTE -  ANTIBIOTIC RENAL DOSE ADJUSTMENT    Patient has been initiated on cefazolin for MSSA bacteremia, endocarditis.  CKD IV - now receiving intermittent HD.  Also still making some urine (~ 290 ml last 24 hr)  Currently receiving cefazolin 2g q 12 hrs.  Since patient now receiving iHD, but also making a little urine, will change dose to cefazolin 2g q 24 hrs.  Please contact if questions.  Thank you!  Marguerite Olea, [facility] Clinical Pharmacist Phone [phone]  08/15/2019 11:32 AM

## 2019-08-15 NOTE — Progress Notes (Signed)
[facility] for Infectious Disease  Date of Admission:  08/03/2019      Total days of antibiotics 13  Cefazolin day 10           ASSESSMENT: Jaclyn Day is a 73 y.o. female female with cardiac arrest outside the hospital (unclear etiology). Found to have MSSA bacteremia and significantly depressed LVEF (20-25%) in the setting of AFib RVR following arrest. 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   Endocarditis of native Aortic Valve - Blood cultures on 10/29 no growth, final. Continue Cefazolin for 6 weeks, tentatively 09/18/19. She needs TCTS consult. No findings c/w heart failure on exam presently.   Lethargy - not as awake this morning compared to yesterday. Not following commands. ?anoxic component with out of hospital arrest (uncertain if witnessed event) vs uremia vs CNS sequelae of possible aortic valve endocarditis. If no improvement would consider head imaging to evaluate. HD planned today.   Leukocytosis worse today. No new signs of infection identified. CXR w/o obvious pneumonia and more c/w fluid overload/atx. Continue to follow/trend.       PLAN: 1. Continue Cefazolin x 6 weeks through Dec 7 2. Continue to trend fever/WBC  3. Consider head imaging if she is unchanged after HD    Principal Problem:   MSSA bacteremia Active Problems:   Cardiac arrest ([place])   Pressure injury of skin   Acute respiratory failure with hypoxia (HCC)   AKI (acute kidney injury) (HCC)   Elevated troponin   Persistent atrial fibrillation (H[facility])   Acute on chronic combined systolic and diastolic CHF (congestive heart failure) ([facility])   Acute bacterial endocarditis   . amiodarone  200 mg Per Tube Daily  . ARIPiprazole  10 mg Per Tube BID  . atorvastatin  20 mg Per NG tube Daily  . chlorhexidine  15 mL Mouth Rinse BID  . Chlorhexidine Gluconate Cloth  6 each Topical Daily  . Chlorhexidine Gluconate Cloth  6 each Topical Q0600  . insulin aspart  0-9  Units Subcutaneous Q4H  . mouth rinse  15 mL Mouth Rinse q12n4p  . pantoprazole (PROTONIX) IV  40 mg Intravenous Q24H  . sodium chloride flush  10-40 mL Intracatheter Q12H  . Thrombi-Pad  1 each Topical Once    SUBJECTIVE: Non-verbal.   Interval Addition -  Planning intermittent HD WBC up today Extubated and on 15 L HFNC   Review of Systems: Review of Systems  Unable to perform ROS: Critical illness  Unable to get meaningful ROS   Allergies  Allergen Reactions  . Codeine Other (See Comments)    Reaction not recalled by family- was told to never take this    OBJECTIVE: Vitals:   08/15/19 0400 08/15/19 0500 08/15/19 0712 08/15/19 0747  BP: (!) 146/59 (!) 122/56 134/65   Pulse: 71 71 75   Resp: 12 18 (!) 25   Temp:    98.7 F (37.1 C)  TempSrc:    Oral  SpO2: 96% 96% 97%   Weight:      Height:       Body mass index is 26.15 kg/m.  Physical Exam Constitutional:      Comments: Resting quietly in bed.   HENT:     Mouth/Throat:     Mouth: Mucous membranes are dry.  Eyes:     General: No scleral icterus.    Pupils: Pupils are equal, round, and reactive to light.  Neck:  Comments: R IJ HD cath clean and dry  Cardiovascular:     Rate and Rhythm: Normal rate and regular rhythm.     Heart sounds: No murmur.     Comments: NSR on tele Pulmonary:     Effort: Pulmonary effort is normal.     Breath sounds: No rhonchi.     Comments: Shallow inspiratory effort. POx 95% on 15LPM HFNC. Breathing comfortably  Abdominal:     General: Bowel sounds are normal. There is no distension.  Musculoskeletal:        General: No swelling.  Skin:    General: Skin is warm and dry.     Capillary Refill: Capillary refill takes less than 2 seconds.     Comments: Scattered areas of ecchymosis   Neurological:     Mental Status: She is alert.     Comments: Unable to meaningfully assess. Did follow commands and MAE x 4.      Lab Results Lab Results  Component Value Date   WBC  25.7 (H) 08/15/2019   HGB 7.8 (L) 08/15/2019   HCT 26.1 (L) 08/15/2019   MCV 95.6 08/15/2019   PLT 287 08/15/2019    Lab Results  Component Value Date   CREATININE 2.72 (H) 08/15/2019   BUN 33 (H) 08/15/2019   NA 139 08/15/2019   K 4.7 08/15/2019   CL 104 08/15/2019   CO2 24 08/15/2019    Lab Results  Component Value Date   ALT 20 08/03/2019   AST 26 08/03/2019   ALKPHOS 94 08/03/2019   BILITOT 1.2 08/03/2019     Microbiology: Recent Results (from the past 240 hour(s))  Culture, blood (routine x 2)     Status: None   Collection Time: 08/07/19  6:29 PM   Specimen: BLOOD RIGHT ARM  Result Value Ref Range Status   Specimen Description BLOOD RIGHT ARM  Final   Special Requests   Final    BOTTLES DRAWN AEROBIC ONLY Blood Culture results may not be optimal due to an inadequate volume of blood received in culture bottles   Culture   Final    NO GROWTH 5 DAYS Performed at [facility], [place] [address] [place], [place] [address]    Report Status 08/12/2019 FINAL  Final  Culture, blood (routine x 2)     Status: None   Collection Time: 08/07/19  6:34 PM   Specimen: BLOOD RIGHT ARM  Result Value Ref Range Status   Specimen Description BLOOD RIGHT ARM  Final   Special Requests   Final    BOTTLES DRAWN AEROBIC ONLY Blood Culture results may not be optimal due to an inadequate volume of blood received in culture bottles   Culture   Final    NO GROWTH 5 DAYS Performed at [facility], [place] [address], [place], [place] [address]    Report Status 08/12/2019 FINAL  Final  Culture, blood (routine x 2)     Status: None   Collection Time: 08/10/19 10:16 AM   Specimen: BLOOD RIGHT HAND  Result Value Ref Range Status   Specimen Description BLOOD RIGHT HAND  Final   Special Requests AEROBIC BOTTLE ONLY Blood Culture adequate volume  Final   Culture   Final    NO GROWTH 5 DAYS Performed at [facility], [place] [address] [place], [place] [address]    Report Status  08/15/2019 FINAL  Final  Culture, blood (routine x 2)     Status: None  Collection Time: 08/10/19 10:17 AM   Specimen: BLOOD RIGHT HAND  Result Value Ref Range Status   Specimen Description BLOOD RIGHT HAND  Final   Special Requests AEROBIC BOTTLE ONLY Blood Culture adequate volume  Final   Culture   Final    NO GROWTH 5 DAYS Performed at [facility], [address] [place], [place] [address]    Report Status 08/15/2019 FINAL  Final     Janene Madeira, MSN, NP-C [place] for Infectious Disease [email or web address] Pager: [phone] Office: [phone] [place]: [phone]

## 2019-08-15 NOTE — Progress Notes (Signed)
  Speech Language Pathology Treatment: Dysphagia  Patient Details Name: MARCO MOLOCK MRN: [id number] DOB: 08-16-1946 Today's Date: 08/15/2019 Time: [id number] SLP Time Calculation (min) (ACUTE ONLY): 9 min  Assessment / Plan / Recommendation Clinical Impression  Pt now with Cortrak. Though pts eyes wer open during session, her gaze was not focused, she did not follow commands or attempt to communicate. She made no meaningful response to ice chip trial or any other tactile or verbal cueing. Minimal moisture applied was suctioned from her mouth. Pt to remain NPO pending improvement in mental status.   HPI HPI: Pt is a 73 yo F admitted for witnessed out of hospital cardiac arrest, unclear etiology, with field ROSC, resultant respiratory failure and encephalopathy s/p TTM; ETT 10/22-11/1. CRRT initiated 10/30. PMH: unilateral congenital absence of kidney, Afib, HTN, CKD, CHF, anemia, mood disorder      SLP Plan  Continue with current plan of care       Recommendations  Diet recommendations: NPO                Follow up Recommendations: Skilled Nursing facility Plan: Continue with current plan of care       GO               Herbie [place], [place] Pager [phone] Office [phone]  Lynann Beaver 08/15/2019, 11:30 AM

## 2019-08-15 NOTE — Progress Notes (Signed)
Pt was taken off of [place] and placed on HFNC salter at 10L. Pt saturations 98% on new oxygen device. Pt tolerating well. RN made aware of changes. RT will continue to monitor.

## 2019-08-15 NOTE — Progress Notes (Signed)
Stopped Cardizem drip. BP in 60/40s and MAPs 40s-50s. Pt is still in Afib 90s to low 100s. Paged Cardiology. Will stop Cardizem drip and page again if patient goes back to rapid Afib in >130s. Waiting on NP to find whether or not Levophed can be restarted.    Jaki Hammerschmidt 08/15/2019 1640

## 2019-08-15 NOTE — Progress Notes (Signed)
NAME:  Jaclyn Day, MRN:  [id number], DOB:  06/22/1946, LOS: 32 ADMISSION DATE:  08/03/2019, CONSULTATION DATE:  10/22 REFERRING MD:  Dr. Sherril Croon, CHIEF COMPLAINT:  Post arrest    Brief History   73yo female admitted after out of hospital cardiac arrest, unclear etiology CPR started by fire 10 min CPR with 1 Epi before ROSC. PCCM called for admission and need for targeted temperature management and ventilator management.   Of note, admitted at this facility 01/25/2019-01/31/2019 for new onset A-fib with development of diastolic congestive heart failure, during admission she underwent TEE and cardioversion with transition to SR.   Past Medical History  Congenital unilateral kidney  CKD stage IV (baseline creatinine 2.2-2.8) w/hx of nephrectomy  Hypertension  Persistent A-fib on anticoagulation/ Eliquis Chronic diastolic CHF Anemia  Cardioversion and TEE 01/31/2019 Mood disorder  Significant Hospital Events   10/22 Admitted post arrest, intubated, initiation of targeted temperature management 36 celsius   10/23 + 800 ml I/O, TTM, Weaned off Levophed overnight, ECHO: LVEF: 20-25%, global hypokinesis, RV midly reduced function with normal size, mild MR, normal pulmonary pressures.  Lactate cleared  10/24 - 40% fio2, Oliguric v Anuric. On amio gtt, heparin gtt, neo gtt . Being weaned off neo gtt. Rewarmed to 37. Non purposeful only (not on sedation). Growing e colii in urine from admission  10/25 - staph identified in blood and vanc started yesterday. Still with poor Ur OP. Foley leak suspected and foley stopped. No new foley . RT says patient followed commands and wants to do SBT. On amio gtt. Off leveophed an dneo. On Heparin gtt.  iHD w/ 1L off   10/26-  following simple commands, placed on precedex for agitation, febrile- re-cultured and femoral CVL discontinued   10/28- converted to NSR, TEE   10/30 - CRRT for volume removal  Consults:  PCCM ID Cards  Procedures:  ETT 10/22  >>11/1 R radial Aline 10/22 >> 10/27 Right femoral CVC 10/22 >>10/26 R IJ trialysis 10/25 >>  Significant Diagnostic Tests:  10/22:  ECHO: LVEF: 20-25%, global hypokinesis, RV midly reduced function with normal size, mild MR, normal pulmonary pressures.  10/22: CTH: without acute intracranial pathology  [place] 10/23 - IMPRESSION: 1. Status post left nephrectomy 2. Echogenic right kidney with cortical thinning consistent with medical renal disease. No hydronephrosis. Cysts, fewer than 10 in the right kidney. 3. Right pleural effusion  10/26 limited TTE >> EF 25-30%, mod reduced RV function  10/29 TEE - EF 55-60%, severely dilated left atrium, small filamentous structure on aortic valve, suggestive of vegetation  Micro Data:  Blood culture 10/22 >> staph nos 1/4 COVID 10/22 >> Negative RVP 10/22 >> Negative Urine 10/22 - E colii 10/26 BCx 2 >> neg  Antimicrobials:  Rocephin 10/22 >> 10/25 Vanc (staph in blood ) 10/24  Cefazolin 10/25 >>  Interim history/subjective:  11/3: remains on HHFNC, hgb relatively stable but wbc increasing, afebrile. Pt is somnolent this am but protecting airway.   Objective   Blood pressure 134/65, pulse 75, temperature 98.2 F (36.8 C), temperature source Axillary, resp. rate (!) 25, height 5\' 6"  (1.676 m), weight 73.5 kg, SpO2 97 %.    FiO2 (%):  [30 %-40 %] 40 %   Intake/Output Summary (Last 24 hours) at 08/15/2019 0749 Last data filed at 08/15/2019 0600 Gross per 24 hour  Intake 663.56 ml  Output 290 ml  Net 373.56 ml   Filed Weights   08/13/19 0615 08/14/19 0500 08/15/19 0357  Weight:  75.2 kg 74 kg 73.5 kg   Physical Exam: General: Elderly chronically ill-appearing, no acute distress HENT: NCAT, OP clear, MMM Eyes: EOMI, no scleral icterus Respiratory: Clear to auscultation bilaterally.  No crackles, wheezing or rales Cardiovascular: RRR, -M/R/G, no JVD GI: BS+, soft, nontender Extremities:-Edema,-tenderness Neuro: drowsy, but  protecting airway, intermittently follows commands per reports GU: Foley in place   Resolved Hospital Problem list   Hypotension secondary to CRRT  Assessment & Plan:  This is a 73 yo F being admitted to ICU for witnessed out of hospital cardiac arrest, unclear etiology, with field ROSC, resultant respiratory failure and encephalopathy s/p TTM at 36 degrees celsius found to have E. Coli UTI and 1/4+ stap aureus on blood cultures.  Agitation/anxiety requiring sedation P:  Off Precedex. Restart as needed until we can obtain enteral access  Continue home depakote and abilify Prn fentanyl, minimize benzo's  Acute hypoxic respiratory failure due to cardiac arrest, pulmonary edema  Extubated 11/1.  P: On HHFNC, wean as able for sats >92%   Staph aureus bacteremia secondary endocarditis  E colii UTI  P: Appreciate ID input.  Continue cefazolin. End date six weeks from last negative culture 08/07/19 - 09/18/19 10/29 cx remain negative 11/3 Trend wbc: worsening but afebrile  Out of hospital cardiac arrest s/p TTM Suspect this was precipitated by E Colii UTI +/- staph aureus bacteremia P:  Telemetry Goal MAP > 65 Goal K and Mg, 4 and 2 respectively  Acute systolic heart failure (baseline normal EF in April 2020) -ECHO 08/03/2019: LVEF: 20-25%, global hypokinesis, RV midly reduced function with normal size, mild MR, normal pulmonary pressures.  -TTE 10/26 shows EF 20-25% -TEE EF recovered 55-60% P:  [facility] input. Cath deferred due to AKI  No BB/ ACEi  PAF  Currently in NSR P:  Cards following PO amio Continue heparin gtt  Acute on chronic CKD IV, likely ATN component given cardiac arrest Congential Unilateral Kidney   - remains oliguric, renal US without evidence of obstruction  - started on iHD 10/25, 10/27, 10/30-11/1 P:   Dialysis per Nephrology. Currently holding due to recent hypotension  Electrolyte replacement per HD  Foley catheter for strict I&Os   Monitor UOP off dialysis for renal recovery  Trend BMP  Hx of mood disorder P:  Continue home depakote and abilify Holding home cymbalta  TMJ resolved  Best practice:  Diet:  Restart TF when enteral access obtained Pain/Anxiety/Delirium protocol (if indicated): prn fentanyl  VAP protocol (if indicated): Yes DVT prophylaxis: Heparin gtt  GI prophylaxis: PPI  Glucose control: SSI Q4H - remains controlled Mobility: Bed rest  Code Status: FULL  Family Communication:11/2 Disposition: ICU.. can move to progressive later today if more awake on HHFNC If able to transfer will ask [facility] to assume care.      [place] DO After hours pager: [phone]  [facility] 08/15/2019, 7:50 AM

## 2019-08-15 NOTE — Progress Notes (Signed)
eLink Physician-Brief Progress Note Patient Name: CANESHA HIPPS DOB: April 05, 1946 MRN: [id number]   Date of Service  08/15/2019  HPI/Events of Note  ALOC - Patient is on a Heparin IV infusion. Last blood glucose = 111.   eICU Interventions  Will order: 1. ABG STAT. 2. Head CT Scan w/o contrast STAT.     Intervention Category Major Interventions: Change in mental status - evaluation and management  Sommer,Steven Eugene 08/15/2019, 8:29 PM

## 2019-08-15 NOTE — Progress Notes (Addendum)
Pt in rapid Afib 130s-150s. BP 119/74 (89). Paged Cardiology, Dr. Sallyanne Kuster. Order for Cardizem drip. Pt currently undergoing dialysis. Will continue to monitor.  Jaclyn Day 08/15/2019 13:20h

## 2019-08-15 NOTE — Procedures (Signed)
Cortrak  Person Inserting Tube:  Jaclyn Day, RD Tube Type:  Cortrak - 43 inches Tube Location:  Right nare Initial Placement:  Stomach Secured by: Bridle Technique Used to Measure Tube Placement:  Documented cm marking at nare/ corner of mouth Cortrak Secured At:  64 cm Procedure Comments:  Cortrak Tube Team Note:  Consult received to place a Cortrak feeding tube.   No x-ray is required. RN may begin using tube.   If the tube becomes dislodged please keep the tube and contact the Cortrak team at [email or web address] (password [email or web address]) for replacement.  If after hours and replacement cannot be delayed, place a NG tube and confirm placement with an abdominal x-ray.      Jaclyn Matin, MS, RD, LDN, [facility] Inpatient Clinical Dietitian Pager # [phone] After hours/weekend pager # [phone]'

## 2019-08-15 NOTE — Progress Notes (Signed)
ABG collected  

## 2019-08-15 NOTE — Progress Notes (Signed)
[facility]PHROLOGY PROGRESS NOTE  Assessment/ Plan: Pt is a 73 y.o. yo female with out of hospital cardiac arrest CPR for 10 minutes, history of CKD stage IV, CHF.  She was a started dialysis on 10/25 via right IJ catheter.  Urine culture with E. coli, CRRT initiated on 10/30/120.  #AKI on CKD stage IV: CKD due to hypertension.  AKI likely ATN in the setting of shock, cardiac arrest and bacteremia.  She was on CRRT since 10/30-11/1.   -Kidney ultrasound showed solitary right kidney with cortical thinning and cysts. -Remains oliguric, on high flow oxygen.  Blood pressure now is better.  Attempt intermittent hemodialysis today.  #Cardiac arrest, acute on chronic systolic CHF: EF [id number] by echo, cardiology is following.  EF improved in TEE.  #MSSA bacteremia: Continue cefazolin. End date six weeks from last negative culture 08/07/19 - 09/18/19.  #Hypotension: Off pressor, BP improved.  #Encephalopathy: On Precedex for agitation.  Quite lethargic this morning  #Acute respiratory failure with hypoxia: Pulmonary is following.    #Paroxysmal atrial fibrillation: Cardiology following.  On amiodarone and heparin drip.  Subjective: Seen and examined ICU.  She is quite lethargic.  Maintaining oxygen saturation on nasal cannula.  Off pressors. Objective Vital signs in last 24 hours: Vitals:   08/15/19 0357 08/15/19 0400 08/15/19 0500 08/15/19 0712  BP:  (!) 146/59 (!) 122/56 134/65  Pulse:  71 71 75  Resp:  12 18 (!) 25  Temp:      TempSrc:      SpO2:  96% 96% 97%  Weight: 73.5 kg     Height:       Weight change: -0.5 kg  Intake/Output Summary (Last 24 hours) at 08/15/2019 0746 Last data filed at 08/15/2019 0600 Gross per 24 hour  Intake 663.56 ml  Output 290 ml  Net 373.56 ml       Labs: Basic Metabolic Panel: Recent Labs  Lab 08/13/19 0531 08/13/19 2248 08/14/19 0458 08/14/19 1042 08/15/19 0331  NA 137  136 135 136  --  139  K 4.3  4.3 4.7 4.6  --  4.7   CL 100  100  --  101  --  104  CO2 27  26  --  24  --  24  GLUCOSE 138*  137*  --  116*  --  102*  BUN 11  10  --  21  --  33*  CREATININE 0.79  0.90  --  1.84*  --  2.72*  CALCIUM 8.6*  8.7*  --  8.7*  --  8.8*  PHOS 1.5*  --   --  4.0 5.2*   Liver Function Tests: Recent Labs  Lab 08/12/19 0305 08/12/19 1606 08/13/19 0531  ALBUMIN 1.9* 1.8* 2.1*   No results for input(s): LIPASE, AMYLASE in the last 168 hours. No results for input(s): AMMONIA in the last 168 hours. CBC: Recent Labs  Lab 08/09/19 0327 08/10/19 0437 08/11/19 0730 08/12/19 0305 08/13/19 0531 08/13/19 2248 08/14/19 0458 08/15/19 0331  WBC 13.8* 18.9* 18.3* 21.6* 29.0*  --  22.5* 25.7*  NEUTROABS 10.4* 14.6*  --   --   --   --   --   --   HGB 8.0* 8.5* 8.4* 9.1* 9.1* 8.5* 8.0* 7.8*  HCT 25.6* 27.2* 26.4* 28.6* 29.9* 25.0* 25.9* 26.1*  MCV 92.1 92.5 89.8 89.9 93.1  --  93.5 95.6  PLT 140* 182 186 257 251  --  253 287   Cardiac Enzymes:  No results for input(s): CKTOTAL, CKMB, CKMBINDEX, TROPONINI in the last 168 hours. CBG: Recent Labs  Lab 08/14/19 1130 08/14/19 1508 08/14/19 2024 08/15/19 0003 08/15/19 0338  GLUCAP 111* 106* 103* 112* 99    Iron Studies: No results for input(s): IRON, TIBC, TRANSFERRIN, FERRITIN in the last 72 hours. Studies/Results: Dg Chest 1 View  Result Date: 08/13/2019 CLINICAL DATA:  Cardiac arrest.  O2 desaturation. EXAM: CHEST  1 VIEW COMPARISON:  August 10, 2019 FINDINGS: The ETT terminates in good position. The right central line is in good position. The NG tube terminates below today's film. No pneumothorax. Probable tiny effusions with atelectasis. No suspicious infiltrates, nodules, or masses. IMPRESSION: 1. Support apparatus as above. 2. Tiny pleural effusions with underlying atelectasis. Electronically Signed   By: Dorise Bullion III M.D   On: 08/13/2019 11:04   Dg Abd 1 View  Result Date: 08/14/2019 CLINICAL DATA:  NG tube placement EXAM: ABDOMEN - 1 VIEW  COMPARISON:  Chest x-ray earlier today FINDINGS: NG tube is in the right lower chest, likely within the right lung. Recommend complete removal and replacement. Airspace disease in the right lower lobe. IMPRESSION: NG tube tip in the right lower lung. Recommend complete removal and replacement. Airspace disease in the right lower lobe.  Cannot exclude aspiration These results were called by telephone at the time of interpretation on 08/14/2019 at 12:29 am to patient's nurse, who verbally acknowledged these results. Electronically Signed   By: Rolm Baptise M.D.   On: 08/14/2019 00:29   Dg Chest Port 1 View  Result Date: 08/14/2019 CLINICAL DATA:  Respiratory failure EXAM: PORTABLE CHEST 1 VIEW COMPARISON:  August 13, 2019 FINDINGS: The right-sided central venous catheter is stable in positioning. The enteric tube has been removed. There are persistent small bilateral pleural effusions, right greater than left. The heart size is stable from prior study. There is generalized volume overload with mild pulmonary edema. Bibasilar airspace opacities are again noted and are favored to represent atelectasis. There are end-stage degenerative changes of the right glenohumeral joint. There are multiple displaced left-sided rib fractures. There are multiple displaced right-sided rib fractures. IMPRESSION: 1. Lines and tubes as above. 2. Small bilateral pleural effusions, right greater than left. There are adjacent airspace opacities favored to represent atelectasis. 3. Mild pulmonary edema. 4. Again noted are multiple displaced bilateral rib fractures without evidence for pneumothorax. Electronically Signed   By: Constance Holster M.D.   On: 08/14/2019 05:52   Dg Foot 2 Views Right  Result Date: 08/13/2019 CLINICAL DATA:  Leukocytosis.  Evaluate for site of infection. EXAM: RIGHT FOOT - 2 VIEW COMPARISON:  None. FINDINGS: There is no evidence of fracture or dislocation. There is no evidence of arthropathy or other focal  bone abnormality. Soft tissues are unremarkable. IMPRESSION: No osteomyelitis identified.  No infection noted. Electronically Signed   By: Dorise Bullion III M.D   On: 08/13/2019 11:06    Medications: Infusions: . sodium chloride 10 mL/hr at 08/15/19 0400  .  ceFAZolin (ANCEF) IV Stopped (08/15/19 0359)  . dexmedetomidine (PRECEDEX) IV infusion Stopped (08/14/19 0223)  . heparin Stopped (08/14/19 1026)  . norepinephrine (LEVOPHED) Adult infusion Stopped (08/14/19 2338)  . valproate sodium 250 mg (08/15/19 0529)    Scheduled Medications: . amiodarone  200 mg Per Tube Daily  . ARIPiprazole  10 mg Per Tube BID  . atorvastatin  20 mg Per NG tube Daily  . chlorhexidine  15 mL Mouth Rinse BID  . Chlorhexidine Gluconate Cloth  6 each Topical Daily  . insulin aspart  0-9 Units Subcutaneous Q4H  . mouth rinse  15 mL Mouth Rinse q12n4p  . pantoprazole (PROTONIX) IV  40 mg Intravenous Q24H  . sodium chloride flush  10-40 mL Intracatheter Q12H  . Thrombi-Pad  1 each Topical Once    have reviewed scheduled and prn medications.  Physical Exam: General: Lethargic female on oxygen, not in distress Heart:RRR, s1s2 nl Lungs: Basal crackles, no wheezing Abdomen:soft, Non-tender, non-distended Extremities:No edema Dialysis Access: Right IJ temporary catheter, site clean  Nichael Ehly Prasad Benedetta Sundstrom 08/15/2019,7:46 AM  LOS: 12 days  Pager: ID:[phone]

## 2019-08-15 NOTE — Progress Notes (Signed)
Pt. HR 140-160's UF off Dr. Carolin Sicks paged and notified. Order to keep uf off remainder of HD tx, continue with treatment. Primary nurse Joette RN aware

## 2019-08-15 NOTE — Progress Notes (Signed)
[place] for Heparin > resume Indication: atrial fibrillation  Allergies  Allergen Reactions  . Codeine Other (See Comments)    Reaction not recalled by family- was told to never take this    Patient Measurements: Height: 5\' 6"  (167.6 cm) Weight: 162 lb 0.6 oz (73.5 kg) IBW/kg (Calculated) : 59.3 Heparin Dosing Weight: 78 kg  Vital Signs: Temp: 98.7 F (37.1 C) (11/03 0747) Temp Source: Oral (11/03 0747) BP: 133/75 (11/03 0945) Pulse Rate: 73 (11/03 0945)  Labs: Recent Labs    08/13/19 0531 08/13/19 1652 08/13/19 2248 08/14/19 0458 08/15/19 0331  HGB 9.1*  --  8.5* 8.0* 7.8*  HCT 29.9*  --  25.0* 25.9* 26.1*  PLT 251  --   --  253 287  HEPARINUNFRC 0.76* 0.47  --  0.47 <0.10*  CREATININE 0.79  0.90  --   --  1.84* 2.72*    Estimated Creatinine Clearance: 19.2 mL/min (A) (by C-G formula based on SCr of 2.72 mg/dL (H)).  Assessment: Pt is a 73 y/o female admitted s/p out of hospital cardiac arrest. ROSC was achieved and targeted temperature management was initiated. Pt has a history of A-fib (taking eliquis PTA), diastolic heart failure, HTN, HDL, IDDM, CKD. Pharmacy has been consulted to dose heparin for A-fib.  Heparin level undetectable this AM.  Heparin drip was turned off yesterday for epistaxis s/p attempt at NG tube.  Heparin left off until cortrak placed, which was placed this AM.  Goal of Therapy:  Heparin level 0.3-0.7 units/ml Monitor platelets by anticoagulation protocol: Yes   Plan:  Restart heparin gtt at 1250 units/hr Check heparin level in 8 hrs. Daily heparin level and CBC.  Marguerite Olea, [facility] Clinical Pharmacist Phone [phone]  08/15/2019 11:25 AM

## 2019-08-15 NOTE — Progress Notes (Signed)
[facility] for Heparin > resume Indication: atrial fibrillation  Allergies  Allergen Reactions  . Codeine Other (See Comments)    Reaction not recalled by family- was told to never take this    Patient Measurements: Height: 5\' 6"  (167.6 cm) Weight: 165 lb 12.6 oz (75.2 kg) IBW/kg (Calculated) : 59.3 Heparin Dosing Weight: 78 kg  Vital Signs: Temp: 98.1 F (36.7 C) (11/03 1952) Temp Source: Axillary (11/03 1952) BP: 119/79 (11/03 2000) Pulse Rate: 105 (11/03 2000)  Labs: Recent Labs    08/13/19 0531  08/13/19 2248 08/14/19 0458 08/15/19 0331 08/15/19 2000  HGB 9.1*  --  8.5* 8.0* 7.8*  --   HCT 29.9*  --  25.0* 25.9* 26.1*  --   PLT 251  --   --  253 287  --   HEPARINUNFRC 0.76*   < >  --  0.47 <0.10* 0.61  CREATININE 0.79  0.90  --   --  1.84* 2.72*  --    < > = values in this interval not displayed.    Estimated Creatinine Clearance: 19.4 mL/min (A) (by C-G formula based on SCr of 2.72 mg/dL (H)).  Assessment: Pt is a 73 y/o female admitted s/p out of hospital cardiac arrest. ROSC was achieved and targeted temperature management was initiated. Pt has a history of A-fib (taking eliquis PTA), diastolic heart failure, HTN, HDL, IDDM, CKD.   Initial hep lvl therapeutic after restart  Goal of Therapy:  Heparin level 0.3-0.7 units/ml Monitor platelets by anticoagulation protocol: Yes   Plan:  heparin gtt at 1250 units/hr Daily heparin level and CBC.  Barth Kirks, PharmD, BCPS, BCCCP Clinical Pharmacist [phone]  Please check AMION for all [facility] numbers  08/15/2019 8:42 PM

## 2019-08-15 NOTE — Progress Notes (Addendum)
Progress Note  Patient Name: Jaclyn Day Date of Encounter: 08/15/2019 Primary Cardiologist: Ena Dawley, MD   Subjective   O/N events: Remains on HFNC. Started having nasal bleeding when attempting to insert NGT, held heparin gtt.   Mrs. Moschetto was seen at bedside. She was less interactive today, did open eyes to command and did not appear to be in pain.    Inpatient Medications    Scheduled Meds:  amiodarone  200 mg Per Tube Daily   ARIPiprazole  10 mg Per Tube BID   atorvastatin  20 mg Per NG tube Daily   chlorhexidine  15 mL Mouth Rinse BID   Chlorhexidine Gluconate Cloth  6 each Topical Daily   insulin aspart  0-9 Units Subcutaneous Q4H   mouth rinse  15 mL Mouth Rinse q12n4p   pantoprazole (PROTONIX) IV  40 mg Intravenous Q24H   sodium chloride flush  10-40 mL Intracatheter Q12H   Thrombi-Pad  1 each Topical Once   Continuous Infusions:  sodium chloride 10 mL/hr at 08/15/19 0400    ceFAZolin (ANCEF) IV Stopped (08/15/19 0359)   dexmedetomidine (PRECEDEX) IV infusion Stopped (08/14/19 0223)   heparin Stopped (08/14/19 1026)   norepinephrine (LEVOPHED) Adult infusion Stopped (08/14/19 2338)   valproate sodium 250 mg (08/15/19 0529)   PRN Meds: acetaminophen (TYLENOL) oral liquid 160 mg/5 mL, fentaNYL (SUBLIMAZE) injection, hydrALAZINE, sodium chloride flush   Vital Signs    Vitals:   08/15/19 0346 08/15/19 0357 08/15/19 0400 08/15/19 0500  BP:   (!) 146/59 (!) 122/56  Pulse:   71 71  Resp:   12 18  Temp: 98.2 F (36.8 C)     TempSrc: Axillary     SpO2:   96% 96%  Weight:  73.5 kg    Height:        Intake/Output Summary (Last 24 hours) at 08/15/2019 0618 Last data filed at 08/15/2019 0600 Gross per 24 hour  Intake 703.48 ml  Output 290 ml  Net 413.48 ml   Filed Weights   08/13/19 0615 08/14/19 0500 08/15/19 0357  Weight: 75.2 kg 74 kg 73.5 kg    Telemetry    NSR- Personally Reviewed  ECG    None today   Physical  Exam   General: cooperative, fatigued and no distress HEENT: sclera clear, anicteric Heart: S1, S2 normal, no murmur, rub or gallop, regular rate and rhythm Lungs: rhonchi and increased work of breathing Abdomen: abdomen is soft without significant tenderness, masses, organomegaly or guarding Extremities: extremities normal, atraumatic, no cyanosis or edema Neurology: normal without focal findings Psychiatry: Normal mood and affect  Labs    Chemistry Recent Labs  Lab 08/12/19 0305 08/12/19 1606 08/13/19 0531 08/13/19 2248 08/14/19 0458 08/15/19 0331  NA 138 137 137   136 135 136 139  K 4.2 4.4 4.3   4.3 4.7 4.6 4.7  CL 101 101 100   100  --  101 104  CO2 25 27 27   26   --  24 24  GLUCOSE 120* 131* 138*   137*  --  116* 102*  BUN 26* 15 11   10   --  21 33*  CREATININE 1.53* 1.15* 0.79   0.90  --  1.84* 2.72*  CALCIUM 8.5* 8.1* 8.6*   8.7*  --  8.7* 8.8*  ALBUMIN 1.9* 1.8* 2.1*  --   --   --   GFRNONAA 34* 47* >60   >60  --  27* 17*  GFRAA 39*  55* >60   >60  --  31* 19*  ANIONGAP 12 9 10   10   --  11 11     Hematology Recent Labs  Lab 08/13/19 0531 08/13/19 2248 08/14/19 0458 08/15/19 0331  WBC 29.0*  --  22.5* 25.7*  RBC 3.21*  --  2.77* 2.73*  HGB 9.1* 8.5* 8.0* 7.8*  HCT 29.9* 25.0* 25.9* 26.1*  MCV 93.1  --  93.5 95.6  MCH 28.3  --  28.9 28.6  MCHC 30.4  --  30.9 29.9*  RDW 17.4*  --  17.5* 17.7*  PLT 251  --  253 287    Cardiac EnzymesNo results for input(s): TROPONINI in the last 168 hours. No results for input(s): TROPIPOC in the last 168 hours.   BNPNo results for input(s): BNP, PROBNP in the last 168 hours.   DDimer No results for input(s): DDIMER in the last 168 hours.   Radiology    Dg Chest 1 View  Result Date: 08/13/2019 CLINICAL DATA:  Cardiac arrest.  O2 desaturation. EXAM: CHEST  1 VIEW COMPARISON:  August 10, 2019 FINDINGS: The ETT terminates in good position. The right central line is in good position. The NG tube terminates below  today's film. No pneumothorax. Probable tiny effusions with atelectasis. No suspicious infiltrates, nodules, or masses. IMPRESSION: 1. Support apparatus as above. 2. Tiny pleural effusions with underlying atelectasis. Electronically Signed   By: Dorise Bullion III M.D   On: 08/13/2019 11:04   Dg Abd 1 View  Result Date: 08/14/2019 CLINICAL DATA:  NG tube placement EXAM: ABDOMEN - 1 VIEW COMPARISON:  Chest x-ray earlier today FINDINGS: NG tube is in the right lower chest, likely within the right lung. Recommend complete removal and replacement. Airspace disease in the right lower lobe. IMPRESSION: NG tube tip in the right lower lung. Recommend complete removal and replacement. Airspace disease in the right lower lobe.  Cannot exclude aspiration These results were called by telephone at the time of interpretation on 08/14/2019 at 12:29 am to patient's nurse, who verbally acknowledged these results. Electronically Signed   By: Rolm Baptise M.D.   On: 08/14/2019 00:29   Dg Chest Port 1 View  Result Date: 08/14/2019 CLINICAL DATA:  Respiratory failure EXAM: PORTABLE CHEST 1 VIEW COMPARISON:  August 13, 2019 FINDINGS: The right-sided central venous catheter is stable in positioning. The enteric tube has been removed. There are persistent small bilateral pleural effusions, right greater than left. The heart size is stable from prior study. There is generalized volume overload with mild pulmonary edema. Bibasilar airspace opacities are again noted and are favored to represent atelectasis. There are end-stage degenerative changes of the right glenohumeral joint. There are multiple displaced left-sided rib fractures. There are multiple displaced right-sided rib fractures. IMPRESSION: 1. Lines and tubes as above. 2. Small bilateral pleural effusions, right greater than left. There are adjacent airspace opacities favored to represent atelectasis. 3. Mild pulmonary edema. 4. Again noted are multiple displaced bilateral  rib fractures without evidence for pneumothorax. Electronically Signed   By: Constance Holster M.D.   On: 08/14/2019 05:52   Dg Foot 2 Views Right  Result Date: 08/13/2019 CLINICAL DATA:  Leukocytosis.  Evaluate for site of infection. EXAM: RIGHT FOOT - 2 VIEW COMPARISON:  None. FINDINGS: There is no evidence of fracture or dislocation. There is no evidence of arthropathy or other focal bone abnormality. Soft tissues are unremarkable. IMPRESSION: No osteomyelitis identified.  No infection noted. Electronically Signed   By:  Dorise Bullion III M.D   On: 08/13/2019 11:06    Cardiac Studies   TTE 08/03/19 1. Left ventricular ejection fraction, by visual estimation, is 20 to 25%. The left ventricle has severely decreased function. Normal left ventricular size. There is moderately increased left ventricular hypertrophy. Severe global hypokinesis. 2. Left ventricular diastolic function could not be evaluated pattern of LV diastolic filling. 3. Global right ventricle has mildly reduced systolic function.The right ventricular size is normal. No increase in right ventricular wall thickness. 4. Left atrial size was severely dilated. 5. Right atrial size was normal. 6. The mitral valve is abnormal. Mild mitral valve regurgitation. 7. The tricuspid valve is grossly normal. Tricuspid valve regurgitation is trivial. 8. The aortic valve is tricuspid Aortic valve regurgitation was not visualized by color flow Doppler. 9. The pulmonic valve was grossly normal. Pulmonic valve regurgitation is not visualized by color flow Doppler. 10. Normal pulmonary artery systolic pressure. 11. The inferior vena cava is normal in size with <50% respiratory variability, suggesting right atrial pressure of 8 mmHg. (on vent) 12. The interatrial septum was not well visualized.  TEE 08/09/19 IMPRESSION:   1. Small filamentous mass on the aortic side of the left coronary cusp, most consistent with vegetation given the  clinical picture of staph bacteremia. 2. No LAA thrombus 3. Negative for PFO by color doppler 4. LVEF improved to 55-60% - possible mild anterior hypokinesis.   Patient Profile     73 y.o. female with a history of HFrEF, HTN, paroxysmal A fib, IDDM, CKD stage 4 s/p nephrectomy who presented after a out of hospital arrest, found to have staph bacteremia/endocarditis, respiratory failure, renal failure, and atrial fibrillation.   Assessment & Plan    1. Paroxysmal atrial fibrillation: Remains rate controlled in NSR, is on amiodarone 200 mg daily. Heparin was held in setting of epistaxis. Can transition to oral anticoagulant once her mental status improves and she is able to tolerate PO medications.  -Continue amiodarone -Can transition to oral anticoagulant from heparin once tolerating PO 2. Acute respiratory failure: S/p extubation 07/14/19. Remains on 15 L HF [place]. Oxygenating well. Wean as tolerated.  3. Acute on chronic systolic heart failure: TTE on 10/22 showed decreased EF of 20-25%. TEE on 10/28 showed EF improved to 55-60% with anterior hypokinesis. Will need to have further evaluation for CAD however will hold off in setting of AKI.   4. SBE: Currently on a prolonged course of antibiotics, cefazolin. On review of TEE it's unclear if there is a vegetation vs a lambls excrescences. End date of antibiotics 6 weeks from last negative culture.  5. AoCKD: Remains oliguric, currently on iHD. Cr increased to 2.72 today. Nephro following.   For questions or updates, please contact [place] Please consult [email or web address] for contact info under Cardiology/STEMI.   Signed, Asencion Noble, MD  08/15/2019, 6:18 AM     I have seen and examined the patient along with Asencion Noble, MD .  I have reviewed the chart, notes and new data.  I agree with PA/NP's note.  Key new complaints: Much less interactive today.  Eyes flicker open to her name but otherwise not communicating.  Does not  appear to be in distress, received fentanyl around midnight for agitation, no sedation since Key examination changes: Mildly tachypneic, clear lungs, no jugular venous distention, mild extremity edema.  Otherwise normal cardiovascular exam. Key new findings / data: Creatinine has increased to 2.7 without dialysis, this is probably her baseline.  Electrolytes are normal except for mildly elevated magnesium and phosphorus.  Hemoglobin 7.8 not much change from yesterday.  White blood cell count remains markedly elevated at 25K.  PLAN: She appears to be encephalopathic. She is not hypoxic by oxygen saturation.  ABG a couple of days ago did not show any evidence of hypercapnia, to the contrary. We will continue intravenous heparin since I do not think we can rely on oral intake going forward. Check liver function tests and TSH.  Check valproic acid level.  Sanda Klein, MD, [place] [phone] 08/15/2019, 9:34 AM

## 2019-08-15 NOTE — Progress Notes (Signed)
 Nutrition Follow-up  DOCUMENTATION CODES:   Non-severe (moderate) malnutrition in context of acute illness/injury  INTERVENTION:   Tube Feeding:  Osmolite 1.5 at 50 ml/hr Pro-Stat 30 mL daily Provides 96 g of protein, 1900 kcals and 912 mL of free water   NUTRITION DIAGNOSIS:   Moderate Malnutrition related to acute illness(acute respiratory failure, AKI required CRRT, endocarditis) as evidenced by mild fat depletion, mild muscle depletion, moderate muscle depletion.  Being addressed via TF   GOAL:   Patient will meet greater than or equal to 90% of their needs  Progressing  MONITOR:   TF tolerance, Diet advancement, Labs, Weight trends, Skin  REASON FOR ASSESSMENT:   Ventilator, Consult Enteral/tube feeding initiation and management  ASSESSMENT:   73 year old female who presented to the ED on 10/22 after cardiac arrest. PMH anemia, CHF, CKD stage IV, HTN, IDDM. Pt required intubation in the ED. TTM initiated.  10/22 -Admit, Intubated, normothermia TTM 10/24 - rewarmed, TF initiated 10/25 - HD catheter placed, first HD 10/27 - HD 10/28 - s/p TEE suspicious for vegetation 10/30 - CRRT initiated 11/01 - CRRT discontinued, extubated   Pt on HFNC; somnolent but arouseable to voice.  SLP following but mentation not appropriate for diet advancement, remains NPO Cortrak placed today, TF to be initiated  Current wt 75.2 kg; lowest weight 73.5 kg. Admission weight 87.5 kg but unsure if stated or measured. Weight of 79.3 kg on 10/24  Labs: phosphorus 5.2 (H), CBGs 82-112, Creatinine 2.72, BUN 33 Meds: ss novolog  NUTRITION - FOCUSED PHYSICAL EXAM:    Most Recent Value  Orbital Region  Mild depletion  Upper Arm Region  No depletion  Thoracic and Lumbar Region  Mild depletion  Buccal Region  Mild depletion  Temple Region  Moderate depletion  Clavicle Bone Region  Mild depletion  Clavicle and Acromion Bone Region  Mild depletion  Scapular Bone Region  Mild  depletion  Dorsal Hand  Unable to assess  Patellar Region  Moderate depletion  Anterior Thigh Region  Moderate depletion  Posterior Calf Region  Severe depletion  Edema (RD Assessment)  Mild  Hair  Reviewed  Eyes  Unable to assess  Mouth  Unable to assess  Skin  Reviewed  Nails  Reviewed       Diet Order:   Diet Order            Diet NPO time specified  Diet effective midnight              EDUCATION NEEDS:   Not appropriate for education at this time  Skin:  Skin Assessment: Reviewed RN Assessment Skin Integrity Issues:: DTI DTI: sacrum  Last BM:  10/31  Height:   Ht Readings from Last 1 Encounters:  08/03/19 5\' 6"  (1.676 m)    Weight:   Wt Readings from Last 1 Encounters:  08/15/19 75.2 kg    Ideal Body Weight:  59.1 kg  BMI:  Body mass index is 26.76 kg/m.  Estimated Nutritional Needs:   Kcal:  [id number]  Protein:  89-105  Fluid:  >/= 1.5 L     Decklan Mau MS, RDN, LDN, CNSC [phone] Pager  [phone] Weekend/On-Call Pager

## 2019-08-15 NOTE — Progress Notes (Signed)
Pt. sbp 88 on cardiazem drip for elevated HR. Pt. Has 15 minutes remaining tx time Dr. Carolin Sicks paged and aware ok to stop tx.

## 2019-08-16 DIAGNOSIS — R7881 Bacteremia: Secondary | ICD-10-CM | POA: Diagnosis not present

## 2019-08-16 DIAGNOSIS — I469 Cardiac arrest, cause unspecified: Secondary | ICD-10-CM | POA: Diagnosis not present

## 2019-08-16 DIAGNOSIS — J9601 Acute respiratory failure with hypoxia: Secondary | ICD-10-CM | POA: Diagnosis not present

## 2019-08-16 DIAGNOSIS — I330 Acute and subacute infective endocarditis: Secondary | ICD-10-CM | POA: Diagnosis not present

## 2019-08-16 DIAGNOSIS — E440 Moderate protein-calorie malnutrition: Secondary | ICD-10-CM | POA: Insufficient documentation

## 2019-08-16 LAB — BASIC METABOLIC PANEL
Anion gap: 10 (ref 5–15)
BUN: 25 mg/dL — ABNORMAL HIGH (ref 8–23)
CO2: 26 mmol/L (ref 22–32)
Calcium: 8.3 mg/dL — ABNORMAL LOW (ref 8.9–10.3)
Chloride: 99 mmol/L (ref 98–111)
Creatinine, Ser: 2.09 mg/dL — ABNORMAL HIGH (ref 0.44–1.00)
GFR calc Af Amer: 27 mL/min — ABNORMAL LOW (ref >60)
GFR calc non Af Amer: 23 mL/min — ABNORMAL LOW (ref >60)
Glucose, Bld: 122 mg/dL — ABNORMAL HIGH (ref 70–99)
Potassium: 4.1 mmol/L (ref 3.5–5.1)
Sodium: 135 mmol/L (ref 135–145)

## 2019-08-16 LAB — GLUCOSE, CAPILLARY
Glucose-Capillary: 101 mg/dL — ABNORMAL HIGH (ref 70–99)
Glucose-Capillary: 102 mg/dL — ABNORMAL HIGH (ref 70–99)
Glucose-Capillary: 117 mg/dL — ABNORMAL HIGH (ref 70–99)
Glucose-Capillary: 137 mg/dL — ABNORMAL HIGH (ref 70–99)
Glucose-Capillary: 140 mg/dL — ABNORMAL HIGH (ref 70–99)
Glucose-Capillary: 93 mg/dL (ref 70–99)

## 2019-08-16 LAB — CBC
HCT: 27.8 % — ABNORMAL LOW (ref 36.0–46.0)
Hemoglobin: 8.2 g/dL — ABNORMAL LOW (ref 12.0–15.0)
MCH: 28.3 pg (ref 26.0–34.0)
MCHC: 29.5 g/dL — ABNORMAL LOW (ref 30.0–36.0)
MCV: 95.9 fL (ref 80.0–100.0)
Platelets: 371 10*3/uL (ref 150–400)
RBC: 2.9 MIL/uL — ABNORMAL LOW (ref 3.87–5.11)
RDW: 17.5 % — ABNORMAL HIGH (ref 11.5–15.5)
WBC: 21.6 10*3/uL — ABNORMAL HIGH (ref 4.0–10.5)
nRBC: 0 % (ref 0.0–0.2)

## 2019-08-16 LAB — PHOSPHORUS
Phosphorus: 4.8 mg/dL — ABNORMAL HIGH (ref 2.5–4.6)
Phosphorus: 4.5 mg/dL (ref 2.5–4.6)

## 2019-08-16 LAB — MAGNESIUM
Magnesium: 2.2 mg/dL (ref 1.7–2.4)
Magnesium: 2.1 mg/dL (ref 1.7–2.4)

## 2019-08-16 LAB — HEPARIN LEVEL (UNFRACTIONATED): Heparin Unfractionated: 0.73 IU/mL — ABNORMAL HIGH (ref 0.30–0.70)

## 2019-08-16 MED ORDER — MIDODRINE HCL 5 MG PO TABS
10.0000 mg | ORAL_TABLET | Freq: Three times a day (TID) | ORAL | Status: DC
  Administered 2019-08-16 – 2019-08-17 (×2): 10 mg via ORAL
  Filled 2019-08-16 (×3): qty 2

## 2019-08-16 MED ORDER — METOPROLOL TARTRATE 5 MG/5ML IV SOLN
2.5000 mg | Freq: Once | INTRAVENOUS | Status: AC
  Administered 2019-08-16: 2.5 mg via INTRAVENOUS
  Filled 2019-08-16: qty 5

## 2019-08-16 NOTE — Progress Notes (Signed)
NAME:  Jaclyn Day, MRN:  [id number], DOB:  1946-01-14, LOS: 17 ADMISSION DATE:  08/03/2019, CONSULTATION DATE:  10/22 REFERRING MD:  Dr. Sherril Croon, CHIEF COMPLAINT:  Post arrest    Brief History   73yo female admitted after out of hospital cardiac arrest, unclear etiology CPR started by fire 10 min CPR with 1 Epi before ROSC. PCCM called for admission and need for targeted temperature management and ventilator management.   Of note, admitted at this facility 01/25/2019-01/31/2019 for new onset A-fib with development of diastolic congestive heart failure, during admission she underwent TEE and cardioversion with transition to SR.   Past Medical History  Congenital unilateral kidney  CKD stage IV (baseline creatinine 2.2-2.8) w/hx of nephrectomy  Hypertension  Persistent A-fib on anticoagulation/ Eliquis Chronic diastolic CHF Anemia  Cardioversion and TEE 01/31/2019 Mood disorder  Significant Hospital Events   10/22 Admitted post arrest, intubated, initiation of targeted temperature management 36 celsius   10/23 + 800 ml I/O, TTM, Weaned off Levophed overnight, ECHO: LVEF: 20-25%, global hypokinesis, RV midly reduced function with normal size, mild MR, normal pulmonary pressures.  Lactate cleared  10/24 - 40% fio2, Oliguric v Anuric. On amio gtt, heparin gtt, neo gtt . Being weaned off neo gtt. Rewarmed to 37. Non purposeful only (not on sedation). Growing e colii in urine from admission  10/25 - staph identified in blood and vanc started yesterday. Still with poor Ur OP. Foley leak suspected and foley stopped. No new foley . RT says patient followed commands and wants to do SBT. On amio gtt. Off leveophed an dneo. On Heparin gtt.  iHD w/ 1L off   10/26-  following simple commands, placed on precedex for agitation, febrile- re-cultured and femoral CVL discontinued   10/28- converted to NSR, TEE   10/30 - CRRT for volume removal  Consults:  PCCM ID Cards  Procedures:  ETT 10/22  >>11/1 R radial Aline 10/22 >> 10/27 Right femoral CVC 10/22 >>10/26 R IJ trialysis 10/25 >>  Significant Diagnostic Tests:  10/22:  ECHO: LVEF: 20-25%, global hypokinesis, RV midly reduced function with normal size, mild MR, normal pulmonary pressures.  10/22: CTH: without acute intracranial pathology  [place] 10/23 - IMPRESSION: 1. Status post left nephrectomy 2. Echogenic right kidney with cortical thinning consistent with medical renal disease. No hydronephrosis. Cysts, fewer than 10 in the right kidney. 3. Right pleural effusion  10/26 limited TTE >> EF 25-30%, mod reduced RV function  10/29 TEE - EF 55-60%, severely dilated left atrium, small filamentous structure on aortic valve, suggestive of vegetation  Micro Data:  Blood culture 10/22 >> staph nos 1/4 COVID 10/22 >> Negative RVP 10/22 >> Negative Urine 10/22 - E colii 10/26 BCx 2 >> neg  Antimicrobials:  Rocephin 10/22 >> 10/25 Vanc (staph in blood ) 10/24  Cefazolin 10/25 >>  Interim history/subjective:  11/3: remains on HHFNC, hgb relatively stable but wbc increasing, afebrile. Pt is somnolent this am but protecting airway.  11/4: acute mental status change last pm warranting cth which was not with acute issues. Weaned to 6L [place] and this am is alert and following commands. Attempting to speak but with hypophonation.   Objective   Blood pressure 113/74, pulse 85, temperature 97.8 F (36.6 C), temperature source Axillary, resp. rate (!) 31, height 5\' 6"  (1.676 m), weight 73.9 kg, SpO2 100 %.        Intake/Output Summary (Last 24 hours) at 08/16/2019 0806 Last data filed at 08/16/2019 0800 Gross per  24 hour  Intake 1935.12 ml  Output 1240 ml  Net 695.12 ml   Filed Weights   08/15/19 0357 08/15/19 1130 08/16/19 0500  Weight: 73.5 kg 75.2 kg 73.9 kg   Physical Exam: General: Elderly chronically ill-appearing, no acute distress HENT: NCAT, OP clear, MMM Eyes: EOMI, no scleral icterus Respiratory: Clear to  auscultation bilaterally.  No crackles, wheezing or rales Cardiovascular: RRR, -M/R/G, no JVD GI: BS+, soft, nontender Extremities:-Edema,-tenderness Neuro: awake and following commands.  GU: Foley in place   Resolved Hospital Problem list   Hypotension secondary to CRRT  Assessment & Plan:  This is a 73 yo F being admitted to ICU for witnessed out of hospital cardiac arrest, unclear etiology, with field ROSC, resultant respiratory failure and encephalopathy s/p TTM at 36 degrees celsius found to have E. Coli UTI and 1/4+ stap aureus on blood cultures.  Agitation/anxiety requiring sedation Acute encephalopathy P:  Continue home depakote and abilify Prn fentanyl, minimize benzo's  Acute hypoxic respiratory failure due to cardiac arrest, pulmonary edema  Extubated 11/1.  P: On HHFNC, wean as able for sats >92% -down to 6L HFNC   Staph aureus bacteremia secondary endocarditis  E colii UTI  P: Appreciate ID input.  Continue cefazolin. End date six weeks from last negative culture 08/07/19 - 09/18/19 10/29 cx remain negative 11/3 Remains afebrile and wbc cont to improve  Out of hospital cardiac arrest s/p TTM Suspect this was precipitated by E Colii UTI +/- staph aureus bacteremia P:  Telemetry Goal MAP > 65 Goal K and Mg, 4 and 2 respectively  Acute systolic heart failure (baseline normal EF in April 2020) -ECHO 08/03/2019: LVEF: 20-25%, global hypokinesis, RV midly reduced function with normal size, mild MR, normal pulmonary pressures.  -TTE 10/26 shows EF 20-25% -TEE EF recovered 55-60% P:  [facility] input. Cath deferred due to AKI  No BB/ ACEi  PAF  Currently in NSR P:  Cards following PO amio Continue heparin gtt  Acute on chronic CKD IV, likely ATN component given cardiac arrest Congential Unilateral Kidney   - remains oliguric, renal US without evidence of obstruction  - started on iHD 10/25, 10/27, 10/30-11/1 P:   Dialysis per Nephrology.  Currently holding due to recent hypotension  Electrolyte replacement per HD  Foley catheter for strict I&Os  Monitor UOP (poor) off dialysis for renal recovery  Trend BMP  Hx of mood disorder P:  Continue home depakote and abilify Holding home cymbalta  Chronic normocytic anemia:  Stable hgb Transfuse <7  Best practice:  Diet:  tf Pain/Anxiety/Delirium protocol (if indicated): prn fentanyl  VAP protocol (if indicated): Yes DVT prophylaxis: Heparin gtt  GI prophylaxis: PPI  Glucose control: SSI Q4H - remains controlled Mobility: Bed rest  Code Status: FULL  Family Communication:11/4 Disposition: will transfer to progressive later today and have TRH to pick up. CCM will sign off.     [place] DO After hours pager: [phone]  [facility] 08/16/2019, 8:06 AM

## 2019-08-16 NOTE — Progress Notes (Signed)
Physical Therapy Treatment Patient Details Name: Jaclyn Day MRN: [id number] DOB: [birth year] Today's Date: 08/16/2019    History of Present Illness 73yo female admitted after out of hospital cardiac arrest, unclear etiology CPR started by fire 10 min CPR with 1 Epi before ROSC. PMH includes a-fib, diastolic CHF, Mood disorder, CKD. CRRT initiated on 10/31 for volume removal.    PT Comments    Pt limited by reports of dizziness after sitting edge of bed, although BP stable. Pt with elevated RR again this session with mobility, peaking at 51 breaths per minute. Pt requires significant physical assistance to perform all mobility, OOB activity not attempted 2/2 dizziness and safety concerns. Pt will continue to benefit from PT POC to reduce falls risk and caregiver burden.   Follow Up Recommendations  SNF;Supervision/Assistance - 24 hour     Equipment Recommendations  (defer to post-acute setting)    Recommendations for Other Services       Precautions / Restrictions Precautions Precautions: Fall Restrictions Weight Bearing Restrictions: No    Mobility  Bed Mobility Overal bed mobility: Needs Assistance Bed Mobility: Rolling;Sidelying to Sit;Sit to Supine Rolling: Total assist Sidelying to sit: Max assist   Sit to supine: Total assist   General bed mobility comments: pt able to initiate some mobility with LE intermittently as well as some initiation of core to sit up  Transfers                    Ambulation/Gait                 Stairs             Wheelchair Mobility    Modified Rankin (Stroke Patients Only)       Balance Overall balance assessment: Needs assistance Sitting-balance support: Bilateral upper extremity supported;Feet unsupported Sitting balance-Leahy Scale: Poor Sitting balance - Comments: minA Postural control: Left lateral lean                                  Cognition Arousal/Alertness:  Awake/alert Behavior During Therapy: Flat affect Overall Cognitive Status: No family/caregiver present to determine baseline cognitive functioning                                        Exercises      General Comments General comments (skin integrity, edema, etc.): pt VSS during session although pt seems to be reporting she is dizzy when briefly sitting at edge of bed when asked by PT. Pt BP increasing from 97/72 to 121/67 after transitioning from supine to sitting. Pt on 4L [place].      Pertinent Vitals/Pain Pain Assessment: Faces Faces Pain Scale: No hurt    Home Living                      Prior Function            PT Goals (current goals can now be found in the care plan section) Acute Rehab PT Goals Patient Stated Goal: To go home Progress towards PT goals: Progressing toward goals    Frequency    Min 2X/week      PT Plan Current plan remains appropriate    Co-evaluation              AM-PAC PT "6  Clicks" Mobility   Outcome Measure  Help needed turning from your back to your side while in a flat bed without using bedrails?: Total Help needed moving from lying on your back to sitting on the side of a flat bed without using bedrails?: Total Help needed moving to and from a bed to a chair (including a wheelchair)?: Total Help needed standing up from a chair using your arms (e.g., wheelchair or bedside chair)?: Total Help needed to walk in hospital room?: Total Help needed climbing 3-5 steps with a railing? : Total 6 Click Score: 6    End of Session Equipment Utilized During Treatment: Oxygen Activity Tolerance: Treatment limited secondary to medical complications (Comment)(reports of dizziness) Patient left: in bed;with call bell/phone within reach;with bed alarm set Nurse Communication: Mobility status PT Visit Diagnosis: Muscle weakness (generalized) (M62.81)     Time: [id number] PT Time Calculation (min) (ACUTE ONLY): 23  min  Charges:  $Therapeutic Activity: 23-37 mins                     Zenaida Niece, PT, DPT Acute Rehabilitation Pager: [phone]    Zenaida Niece 08/16/2019, 10:43 AM

## 2019-08-16 NOTE — Plan of Care (Signed)
[facility] to assume care from [facility] on 11/5.  See TRH communication for further details.

## 2019-08-16 NOTE — Progress Notes (Signed)
[place] for Heparin  Indication: atrial fibrillation  Allergies  Allergen Reactions  . Codeine Other (See Comments)    Reaction not recalled by family- was told to never take this    Patient Measurements: Height: 5\' 6"  (167.6 cm) Weight: 162 lb 14.7 oz (73.9 kg) IBW/kg (Calculated) : 59.3 Heparin Dosing Weight: 78 kg  Vital Signs: Temp: 97.8 F (36.6 C) (11/04 0337) Temp Source: Axillary (11/04 0337) BP: 138/66 (11/04 0700) Pulse Rate: 73 (11/04 0700)  Labs: Recent Labs    08/14/19 0458 08/15/19 0331 08/15/19 2000 08/15/19 2040 08/16/19 0500  HGB 8.0* 7.8*  --  8.5* 8.2*  HCT 25.9* 26.1*  --  25.0* 27.8*  PLT 253 287  --   --  371  HEPARINUNFRC 0.47 <0.10* 0.61  --  0.73*  CREATININE 1.84* 2.72*  --   --  2.09*    Estimated Creatinine Clearance: 25 mL/min (A) (by C-G formula based on SCr of 2.09 mg/dL (H)).  Assessment: Pt is a 73 y/o female admitted s/p out of hospital cardiac arrest. ROSC was achieved and targeted temperature management was initiated. Pt has a history of A-fib (taking eliquis PTA), diastolic heart failure, HTN, HDL, IDDM, CKD.   Heparin level this morning is slightly above goal at 0.73, hgb low but stable. No bleeding noted, will reduce rate slightly.   Goal of Therapy:  Heparin level 0.3-0.7 units/ml Monitor platelets by anticoagulation protocol: Yes   Plan:  Reduce heparin to 1150 units/hr Daily heparin level and CBC.  Erin Hearing PharmD., BCPS Clinical Pharmacist 08/16/2019 7:54 AM

## 2019-08-16 NOTE — Progress Notes (Signed)
  Progress Note   Date: 08/15/2019  Patient Name: Jaclyn Day        MRN#: [id number]  Clarification of diagnosis:  moderate malnutrition Based on dieticians notes

## 2019-08-16 NOTE — TOC Progression Note (Signed)
Transition of Care [facility]) - Progression Note    Patient Details  Name: Jaclyn Day MRN: [id number] Date of Birth: 08-06-1946  Transition of Care [facility]) CM/SW Contact  Graves-Bigelow, Ocie Cornfield, RN Phone Number: 08/16/2019, 11:48 AM  Clinical Narrative: Post cardiac arrest-Continues on oxygen at 6 Liters. Nephrology following-intermittent HD Pt has Cortrak-placed 08-15-19. CM did speak with patient and boyfriend- list of Skilled Nursing Facilities provided to boyfriend. CM will continue to follow for transition of care needs.       Expected Discharge Plan: [place] Barriers to Discharge: (Pt had cortrak placed 08-15-19)  Expected Discharge Plan and Services Expected Discharge Plan: [place] In-house Referral: NA Discharge Planning Services: CM Consult Post Acute Care Choice: [place] Living arrangements for the past 2 months: Single Family Home                   Social Determinants of Health (SDOH) Interventions    Readmission Risk Interventions Readmission Risk Prevention Plan 08/08/2019  Transportation Screening Complete  PCP or Specialist Appt within 3-5 Days Complete  HRI or Home Care Consult Complete  Medication Review (RN Care Manager) Complete  Some recent data might be hidden

## 2019-08-16 NOTE — Progress Notes (Signed)
Progress Note  Patient Name: Jaclyn Day Date of Encounter: 08/16/2019 Primary Cardiologist: Jaclyn Dawley, MD   Subjective   O/N events: Cortrac placed. Started on Cardizem drip for A fib in RVR, BP dropped to 60/40 so Cardizem drip was stopped and started on levophed. Continued to have worsening mental status changes. CT head obtained that showed no acute findings. ABG unremarkable.   Jaclyn Day was seen and evaluated at bedside. Continued to have decreased responsiveness however occasional followed commands. Not answering questions.   Inpatient Medications    Scheduled Meds: . amiodarone  200 mg Per Tube Daily  . ARIPiprazole  10 mg Per Tube BID  . atorvastatin  20 mg Per NG tube Daily  . chlorhexidine  15 mL Mouth Rinse BID  . Chlorhexidine Gluconate Cloth  6 each Topical Daily  . Chlorhexidine Gluconate Cloth  6 each Topical Q0600  . feeding supplement (PRO-STAT SUGAR FREE 64)  30 mL Per Tube Daily  . insulin aspart  0-9 Units Subcutaneous Q4H  . mouth rinse  15 mL Mouth Rinse q12n4p  . mouth rinse  15 mL Mouth Rinse BID  . pantoprazole (PROTONIX) IV  40 mg Intravenous Q24H  . sodium chloride flush  10-40 mL Intracatheter Q12H  . Thrombi-Pad  1 each Topical Once   Continuous Infusions: . sodium chloride Stopped (08/15/19 1325)  . sodium chloride    . sodium chloride    .  ceFAZolin (ANCEF) IV Stopped (08/15/19 2333)  . diltiazem (CARDIZEM) infusion Stopped (08/15/19 1724)  . feeding supplement (OSMOLITE 1.5 CAL) 50 mL/hr at 08/16/19 0621  . heparin 1,250 Units/hr (08/16/19 0055)  . norepinephrine (LEVOPHED) Adult infusion 3 mcg/min (08/15/19 2306)  . valproate sodium 250 mg (08/16/19 0501)   PRN Meds: sodium chloride, sodium chloride, acetaminophen (TYLENOL) oral liquid 160 mg/5 mL, alteplase, fentaNYL (SUBLIMAZE) injection, heparin, hydrALAZINE, lidocaine (PF), lidocaine-prilocaine, pentafluoroprop-tetrafluoroeth, sodium chloride flush   Vital Signs     Vitals:   08/16/19 0400 08/16/19 0500 08/16/19 0600 08/16/19 0700  BP: 112/61 110/67 111/65 138/66  Pulse: 88 79 81 73  Resp: 18 (!) 21 (!) 21 20  Temp:      TempSrc:      SpO2: 97% 98% 100% 100%  Weight:  73.9 kg    Height:        Intake/Output Summary (Last 24 hours) at 08/16/2019 0716 Last data filed at 08/16/2019 0600 Gross per 24 hour  Intake 1707.62 ml  Output 1240 ml  Net 467.62 ml   Filed Weights   08/15/19 0357 08/15/19 1130 08/16/19 0500  Weight: 73.5 kg 75.2 kg 73.9 kg    Telemetry    NSR- Personally Reviewed  ECG    None today   Physical Exam   General: Awake, fatigued and no distress HEENT: sclera clear, anicteric Heart: S1, S2 normal, no murmur, rub or gallop, irregular rate and rhythm Lungs: Bilateral rhonchi, mildly increased work of breathing Abdomen: abdomen is soft without significant tenderness, masses, organomegaly or guarding Extremities: extremities normal, atraumatic, no cyanosis or edema Neurology: Awake, not oriented, no verbal response, wiggles toes when asked but not following any other commands Psychiatry: Normal mood and affect  Labs    Chemistry Recent Labs  Lab 08/12/19 1606 08/13/19 0531  08/14/19 0458 08/15/19 0331 08/15/19 1130 08/15/19 2040 08/16/19 0500  NA 137 137  136   < > 136 139  --  134* 135  K 4.4 4.3  4.3   < > 4.6 4.7  --  3.8 4.1  CL 101 100  100  --  101 104  --   --  99  CO2 27 27  26   --  24 24  --   --  26  GLUCOSE 131* 138*  137*  --  116* 102*  --   --  122*  BUN 15 11  10   --  21 33*  --   --  25*  CREATININE 1.15* 0.79  0.90  --  1.84* 2.72*  --   --  2.09*  CALCIUM 8.1* 8.6*  8.7*  --  8.7* 8.8*  --   --  8.3*  PROT  --   --   --   --   --  5.5*  --   --   ALBUMIN 1.8* 2.1*  --   --   --  1.9*  --   --   AST  --   --   --   --   --  16  --   --   ALT  --   --   --   --   --  <5  --   --   ALKPHOS  --   --   --   --   --  97  --   --   BILITOT  --   --   --   --   --  0.7  --   --    GFRNONAA 47* >60  >60  --  27* 17*  --   --  23*  GFRAA 55* >60  >60  --  31* 19*  --   --  27*  ANIONGAP 9 10  10   --  11 11  --   --  10   < > = values in this interval not displayed.     Hematology Recent Labs  Lab 08/14/19 0458 08/15/19 0331 08/15/19 2040 08/16/19 0500  WBC 22.5* 25.7*  --  21.6*  RBC 2.77* 2.73*  --  2.90*  HGB 8.0* 7.8* 8.5* 8.2*  HCT 25.9* 26.1* 25.0* 27.8*  MCV 93.5 95.6  --  95.9  MCH 28.9 28.6  --  28.3  MCHC 30.9 29.9*  --  29.5*  RDW 17.5* 17.7*  --  17.5*  PLT 253 287  --  371    Cardiac EnzymesNo results for input(s): TROPONINI in the last 168 hours. No results for input(s): TROPIPOC in the last 168 hours.   BNPNo results for input(s): BNP, PROBNP in the last 168 hours.   DDimer No results for input(s): DDIMER in the last 168 hours.   Radiology    Ct Head Wo Contrast  Result Date: 08/15/2019 CLINICAL DATA:  Acute change in mental status. EXAM: CT HEAD WITHOUT CONTRAST TECHNIQUE: Contiguous axial images were obtained from the base of the skull through the vertex without intravenous contrast. COMPARISON:  08/03/2019 FINDINGS: Brain: No evidence of acute infarction, hemorrhage, hydrocephalus, extra-axial collection or mass lesion/mass effect. Age related atrophy and chronic microvascular ischemic changes are noted. Vascular: No hyperdense vessel or unexpected calcification. Skull: Normal. Negative for fracture or focal lesion. Sinuses/Orbits: There is pansinus mucosal thickening. The mastoid air cells are clear. Other: None. IMPRESSION: 1. No acute intracranial abnormality. 2. Age related atrophy and chronic microvascular ischemic changes. 3. Pansinusitis. Electronically Signed   By: Jaclyn Day M.D.   On: 08/15/2019 23:53    Cardiac Studies   TTE 08/03/19 1. Left ventricular ejection fraction, by visual  estimation, is 20 to 25%. The left ventricle has severely decreased function. Normal left ventricular size. There is moderately  increased left ventricular hypertrophy. Severe global hypokinesis. 2. Left ventricular diastolic function could not be evaluated pattern of LV diastolic filling. 3. Global right ventricle has mildly reduced systolic function.The right ventricular size is normal. No increase in right ventricular wall thickness. 4. Left atrial size was severely dilated. 5. Right atrial size was normal. 6. The mitral valve is abnormal. Mild mitral valve regurgitation. 7. The tricuspid valve is grossly normal. Tricuspid valve regurgitation is trivial. 8. The aortic valve is tricuspid Aortic valve regurgitation was not visualized by color flow Doppler. 9. The pulmonic valve was grossly normal. Pulmonic valve regurgitation is not visualized by color flow Doppler. 10. Normal pulmonary artery systolic pressure. 11. The inferior vena cava is normal in size with <50% respiratory variability, suggesting right atrial pressure of 8 mmHg. (on vent) 12. The interatrial septum was not well visualized.  TEE 08/09/19 IMPRESSION:   1. Small filamentous mass on the aortic side of the left coronary cusp, most consistent with vegetation given the clinical picture of staph bacteremia. 2. No LAA thrombus 3. Negative for PFO by color doppler 4. LVEF improved to 55-60% - possible mild anterior hypokinesis.   Patient Profile     73 y.o. female with a history of HFrEF, HTN, paroxysmal A fib, IDDM, CKD stage 4 s/p nephrectomy who presented after a out of hospital arrest, found to have staph bacteremia/endocarditis, respiratory failure, renal failure, and atrial fibrillation.   Assessment & Plan    1. Paroxysmal atrial fibrillation: Went back into A fib with RVR yesterday requiring diltiazem drip to be started, however started having hypotension so this was discontinued. Started on norepinephrine. Currently rate controlled on amiodarone 200 mg daily. Resumed heparin (had been held due to mild epistaxis). Once mental status  improves can transition to oral anticoagulant such as eliquis.  -Continue amiodarone -Continue heparin drip -Continue levophed as needed, wean as tolerated -Can transition to oral anticoagulant from heparin once tolerating PO 2. Acute respiratory failure: S/p extubation 07/14/19. Remains on 15 L HF [place]. Oxygenating well. Wean as tolerated.  3. Acute encephalopathy: Worsening mental status changes yesterday, was not following commands. Small improvement today, was wiggling toes when asked. CT head showed no acute intracranial abnormalities. ABG was unremarkable. TSH 3.5. Valproic acid level 29. Unclear why she is on valproic acid, no note of seizure disorder, could be for mood disorder.  4. Acute on chronic systolic heart failure: TTE on 10/22 showed decreased EF of 20-25%. TEE on 10/28 showed EF improved to 55-60% with anterior hypokinesis.  -Will need to have further evaluation for CAD however will hold off in setting of AKI.  5. SBE: Currently on a prolonged course of antibiotics, cefazolin. On review of TEE it's unclear if there is a vegetation vs a lambls excrescences. End date of antibiotics 6 weeks from last negative culture.  -ID following, appreciate recommendations -Continue antibiotics 6. AoCKD: Remain oliguric, currently on iHD. Cr today improved to 2.09 from 2.72 yesterday. Nephro following.   For questions or updates, please contact [place] Please consult [email or web address] for contact info under Cardiology/STEMI.   Signed, Asencion Noble, MD  08/16/2019, 7:16 AM     I have seen and examined the patient along with Asencion Noble, MD .  I have reviewed the chart, notes and new data.  I agree with her note.  Key new complaints: Remains encephalopathic, noncommunicative, but  more interactive.  Clearly responds to her name and tracks with her eyes.  Follows limited commands. Key examination changes: Atrial fibrillation with controlled ventricular rate.  No longer hypotensive.   Norepinephrine weaned off. Key new findings / data: Creatinine a little lower today after undergoing brief hemodialysis yesterday.   PLAN: Had recurrent atrial fibrillation yesterday afternoon and became hypotensive after starting intravenous diltiazem.  Required relatively brief use of inotropes, improved this morning, still in atrial fibrillation but rate controlled (p.o. amiodarone), off pressors. Exact cause of her encephalopathy is unclear.  No evidence of hypercapnia, labs do not suggest significant liver disease, not valproate toxic, no abnormalities on CT head.  Normal TSH. Consider checking ammonia level. Once she has reliable enteral intake would switch from intravenous heparin to Eliquis.  Continue enteral route amiodarone. Plan work-up for coronary insufficiency only after recovery/stabilization of renal function.  Sanda Klein, MD, [place] [phone] 08/16/2019, 8:59 AM

## 2019-08-16 NOTE — Progress Notes (Signed)
eLink Physician-Brief Progress Note Patient Name: Jaclyn Day DOB: Sep 24, 1946 MRN: [id number]   Date of Service  08/16/2019  HPI/Events of Note  ALOC - Head CT Scan w/o contrast reveals: 1. No acute intracranial abnormality. 2. Age related atrophy and chronic microvascular ischemic changes. 3. Pansinusitis. ABG on  O2 = 7.471/37.4/113./27.3.  eICU Interventions  Continue present management.      Intervention Category Major Interventions: Change in mental status - evaluation and management  Sommer,Steven Eugene 08/16/2019, 12:38 AM

## 2019-08-16 NOTE — Progress Notes (Signed)
[facility] NEPHROLOGY PROGRESS NOTE  Assessment/ Plan: Pt is a 73 y.o. yo female with out of hospital cardiac arrest CPR for 10 minutes, history of CKD stage IV, CHF.  She was a started dialysis on 10/25 via right IJ catheter.  Urine culture with E. coli, CRRT initiated on 10/30/120.  #AKI on CKD stage IV: CKD due to hypertension.  AKI likely ATN in the setting of shock, cardiac arrest and bacteremia.  She was on CRRT since 10/30-11/1.   -Kidney ultrasound showed solitary right kidney with cortical thinning and cysts. -Remains oliguric, on high flow oxygen.  -Status post intermittent hemodialysis yesterday with around 1000 cc UF.  Patient had hypotension and tachycardia during the treatment.  No plan for HD today.  Will assess in the morning for HD need.  #Cardiac arrest, acute on chronic systolic CHF: EF [id number] by echo, cardiology is following.  EF improved in TEE.  #MSSA bacteremia: Continue cefazolin. End date six weeks from last negative culture 08/07/19 - 09/18/19.  #Hypotension: Required Levophed last night, currently off , BP improved.  #Encephalopathy: On Precedex for agitation.  Quite lethargic this morning  #Acute respiratory failure with hypoxia: Pulmonary is following.    #Paroxysmal atrial fibrillation: Cardiology following.  On amiodarone and heparin drip.  Subjective: Seen and examined ICU.  Opening eyes and looks more alert today than yesterday.  Review of system is limited.  Had HD yesterday.  Requiring around 6 L of oxygen.  Objective Vital signs in last 24 hours: Vitals:   08/16/19 0600 08/16/19 0700 08/16/19 0800 08/16/19 0808  BP: 111/65 138/66 113/74   Pulse: 81 73 85   Resp: (!) 21 20 (!) 31   Temp:    98 F (36.7 C)  TempSrc:    Oral  SpO2: 100% 100% 100%   Weight:      Height:       Weight change: 1.7 kg  Intake/Output Summary (Last 24 hours) at 08/16/2019 0819 Last data filed at 08/16/2019 0800 Gross per 24 hour  Intake 1935.12 ml   Output 1255 ml  Net 680.12 ml       Labs: Basic Metabolic Panel: Recent Labs  Lab 08/14/19 0458  08/15/19 0331 08/15/19 1713 08/15/19 2040 08/16/19 0500  NA 136  --  139  --  134* 135  K 4.6  --  4.7  --  3.8 4.1  CL 101  --  104  --   --  99  CO2 24  --  24  --   --  26  GLUCOSE 116*  --  102*  --   --  122*  BUN 21  --  33*  --   --  25*  CREATININE 1.84*  --  2.72*  --   --  2.09*  CALCIUM 8.7*  --  8.8*  --   --  8.3*  PHOS  --    < > 5.2* 3.0  --  4.8*   < > = values in this interval not displayed.   Liver Function Tests: Recent Labs  Lab 08/12/19 1606 08/13/19 0531 08/15/19 1130  AST  --   --  16  ALT  --   --  <5  ALKPHOS  --   --  97  BILITOT  --   --  0.7  PROT  --   --  5.5*  ALBUMIN 1.8* 2.1* 1.9*   No results for input(s): LIPASE, AMYLASE in the last 168 hours. No  results for input(s): AMMONIA in the last 168 hours. CBC: Recent Labs  Lab 08/10/19 0437  08/12/19 0305 08/13/19 0531  08/14/19 0458 08/15/19 0331 08/15/19 2040 08/16/19 0500  WBC 18.9*   < > 21.6* 29.0*  --  22.5* 25.7*  --  21.6*  NEUTROABS 14.6*  --   --   --   --   --   --   --   --   HGB 8.5*   < > 9.1* 9.1*   < > 8.0* 7.8* 8.5* 8.2*  HCT 27.2*   < > 28.6* 29.9*   < > 25.9* 26.1* 25.0* 27.8*  MCV 92.5   < > 89.9 93.1  --  93.5 95.6  --  95.9  PLT 182   < > 257 251  --  253 287  --  371   < > = values in this interval not displayed.   Cardiac Enzymes: No results for input(s): CKTOTAL, CKMB, CKMBINDEX, TROPONINI in the last 168 hours. CBG: Recent Labs  Lab 08/15/19 1520 08/15/19 1950 08/16/19 0049 08/16/19 0335 08/16/19 0805  GLUCAP 101* 111* 140* 93 101*    Iron Studies: No results for input(s): IRON, TIBC, TRANSFERRIN, FERRITIN in the last 72 hours. Studies/Results: Ct Head Wo Contrast  Result Date: 08/15/2019 CLINICAL DATA:  Acute change in mental status. EXAM: CT HEAD WITHOUT CONTRAST TECHNIQUE: Contiguous axial images were obtained from the base of the skull  through the vertex without intravenous contrast. COMPARISON:  08/03/2019 FINDINGS: Brain: No evidence of acute infarction, hemorrhage, hydrocephalus, extra-axial collection or mass lesion/mass effect. Age related atrophy and chronic microvascular ischemic changes are noted. Vascular: No hyperdense vessel or unexpected calcification. Skull: Normal. Negative for fracture or focal lesion. Sinuses/Orbits: There is pansinus mucosal thickening. The mastoid air cells are clear. Other: None. IMPRESSION: 1. No acute intracranial abnormality. 2. Age related atrophy and chronic microvascular ischemic changes. 3. Pansinusitis. Electronically Signed   By: Constance Holster M.D.   On: 08/15/2019 23:53    Medications: Infusions: . sodium chloride Stopped (08/15/19 1325)  . sodium chloride    . sodium chloride    .  ceFAZolin (ANCEF) IV Stopped (08/15/19 2333)  . feeding supplement (OSMOLITE 1.5 CAL) 50 mL/hr at 08/16/19 0621  . heparin 1,250 Units/hr (08/16/19 0055)  . norepinephrine (LEVOPHED) Adult infusion 3 mcg/min (08/15/19 2306)  . valproate sodium 250 mg (08/16/19 0501)    Scheduled Medications: . amiodarone  200 mg Per Tube Daily  . ARIPiprazole  10 mg Per Tube BID  . atorvastatin  20 mg Per NG tube Daily  . chlorhexidine  15 mL Mouth Rinse BID  . Chlorhexidine Gluconate Cloth  6 each Topical Daily  . Chlorhexidine Gluconate Cloth  6 each Topical Q0600  . feeding supplement (PRO-STAT SUGAR FREE 64)  30 mL Per Tube Daily  . insulin aspart  0-9 Units Subcutaneous Q4H  . mouth rinse  15 mL Mouth Rinse BID  . pantoprazole (PROTONIX) IV  40 mg Intravenous Q24H  . sodium chloride flush  10-40 mL Intracatheter Q12H  . Thrombi-Pad  1 each Topical Once    have reviewed scheduled and prn medications.  Physical Exam: General: Opening eyes with the name, not in distress Heart:RRR, s1s2 nl Lungs: Basal crackles, no wheezing Abdomen:soft, Non-tender, non-distended Extremities:No edema Dialysis  Access: Right IJ temporary catheter, site clean  Jaclyn Day Prasad Kaylub Detienne 08/16/2019,8:19 AM  LOS: 13 days  Pager: [phone]

## 2019-08-17 DIAGNOSIS — N189 Chronic kidney disease, unspecified: Secondary | ICD-10-CM

## 2019-08-17 DIAGNOSIS — R34 Anuria and oliguria: Secondary | ICD-10-CM

## 2019-08-17 DIAGNOSIS — I469 Cardiac arrest, cause unspecified: Secondary | ICD-10-CM | POA: Diagnosis not present

## 2019-08-17 DIAGNOSIS — J9601 Acute respiratory failure with hypoxia: Secondary | ICD-10-CM | POA: Diagnosis not present

## 2019-08-17 DIAGNOSIS — I330 Acute and subacute infective endocarditis: Secondary | ICD-10-CM | POA: Diagnosis not present

## 2019-08-17 DIAGNOSIS — R7881 Bacteremia: Secondary | ICD-10-CM | POA: Diagnosis not present

## 2019-08-17 DIAGNOSIS — Z992 Dependence on renal dialysis: Secondary | ICD-10-CM

## 2019-08-17 DIAGNOSIS — B9561 Methicillin susceptible Staphylococcus aureus infection as the cause of diseases classified elsewhere: Secondary | ICD-10-CM | POA: Diagnosis not present

## 2019-08-17 DIAGNOSIS — I4891 Unspecified atrial fibrillation: Secondary | ICD-10-CM | POA: Diagnosis not present

## 2019-08-17 LAB — BASIC METABOLIC PANEL
Anion gap: 13 (ref 5–15)
BUN: 47 mg/dL — ABNORMAL HIGH (ref 8–23)
CO2: 24 mmol/L (ref 22–32)
Calcium: 8.4 mg/dL — ABNORMAL LOW (ref 8.9–10.3)
Chloride: 98 mmol/L (ref 98–111)
Creatinine, Ser: 2.99 mg/dL — ABNORMAL HIGH (ref 0.44–1.00)
GFR calc Af Amer: 17 mL/min — ABNORMAL LOW (ref >60)
GFR calc non Af Amer: 15 mL/min — ABNORMAL LOW (ref >60)
Glucose, Bld: 115 mg/dL — ABNORMAL HIGH (ref 70–99)
Potassium: 4.6 mmol/L (ref 3.5–5.1)
Sodium: 135 mmol/L (ref 135–145)

## 2019-08-17 LAB — HEPARIN LEVEL (UNFRACTIONATED): Heparin Unfractionated: 0.59 IU/mL (ref 0.30–0.70)

## 2019-08-17 LAB — GLUCOSE, CAPILLARY
Glucose-Capillary: 115 mg/dL — ABNORMAL HIGH (ref 70–99)
Glucose-Capillary: 120 mg/dL — ABNORMAL HIGH (ref 70–99)
Glucose-Capillary: 120 mg/dL — ABNORMAL HIGH (ref 70–99)
Glucose-Capillary: 123 mg/dL — ABNORMAL HIGH (ref 70–99)
Glucose-Capillary: 126 mg/dL — ABNORMAL HIGH (ref 70–99)
Glucose-Capillary: 140 mg/dL — ABNORMAL HIGH (ref 70–99)

## 2019-08-17 LAB — PHOSPHORUS: Phosphorus: 4.6 mg/dL (ref 2.5–4.6)

## 2019-08-17 LAB — MAGNESIUM: Magnesium: 2.2 mg/dL (ref 1.7–2.4)

## 2019-08-17 MED ORDER — HEPARIN (PORCINE) 25000 UT/250ML-% IV SOLN
1300.0000 [IU]/h | INTRAVENOUS | Status: DC
  Administered 2019-08-17 – 2019-08-19 (×3): 1150 [IU]/h via INTRAVENOUS
  Administered 2019-08-20 – 2019-08-21 (×2): 950 [IU]/h via INTRAVENOUS
  Administered 2019-08-23: 1000 [IU]/h via INTRAVENOUS
  Administered 2019-08-24: 1050 [IU]/h via INTRAVENOUS
  Administered 2019-08-25 – 2019-08-26 (×2): 1100 [IU]/h via INTRAVENOUS
  Administered 2019-08-27: 1200 [IU]/h via INTRAVENOUS
  Filled 2019-08-17 (×10): qty 250

## 2019-08-17 MED ORDER — MIDODRINE HCL 5 MG PO TABS
10.0000 mg | ORAL_TABLET | Freq: Three times a day (TID) | ORAL | Status: DC
  Administered 2019-08-17 – 2019-09-05 (×50): 10 mg
  Filled 2019-08-17 (×51): qty 2

## 2019-08-17 MED ORDER — AMIODARONE HCL 200 MG PO TABS
400.0000 mg | ORAL_TABLET | Freq: Every day | ORAL | Status: DC
  Administered 2019-08-17 – 2019-08-23 (×7): 400 mg
  Filled 2019-08-17 (×7): qty 2

## 2019-08-17 NOTE — Progress Notes (Signed)
[place] for Heparin  Indication: atrial fibrillation  Allergies  Allergen Reactions  . Codeine Other (See Comments)    Reaction not recalled by family- was told to never take this    Patient Measurements: Height: 5\' 6"  (167.6 cm) Weight: 166 lb 3.6 oz (75.4 kg) IBW/kg (Calculated) : 59.3 Heparin Dosing Weight: 78 kg  Vital Signs: Temp: 98.6 F (37 C) (11/05 0745) Temp Source: Oral (11/05 0745) BP: 124/72 (11/05 0600) Pulse Rate: 94 (11/05 0600)  Labs: Recent Labs    08/15/19 0331 08/15/19 2000 08/15/19 2040 08/16/19 0500 08/17/19 0222  HGB 7.8*  --  8.5* 8.2*  --   HCT 26.1*  --  25.0* 27.8*  --   PLT 287  --   --  371  --   HEPARINUNFRC <0.10* 0.61  --  0.73* 0.59  CREATININE 2.72*  --   --  2.09* 2.99*    Estimated Creatinine Clearance: 17.6 mL/min (A) (by C-G formula based on SCr of 2.99 mg/dL (H)).  Assessment: Pt is a 73 y/o female admitted s/p out of hospital cardiac arrest. ROSC was achieved and targeted temperature management was initiated. Pt has a history of A-fib (taking eliquis PTA), diastolic heart failure, HTN, HDL, IDDM, CKD.   Heparin level this morning is at goal at 0.59, cbc not checked this morning will order for tomorrow. No bleeding noted.   Goal of Therapy:  Heparin level 0.3-0.7 units/ml Monitor platelets by anticoagulation protocol: Yes   Plan:  Continue heparin at 1150 units/hr Daily heparin level and CBC.  Erin Hearing PharmD., BCPS Clinical Pharmacist 08/17/2019 8:46 AM

## 2019-08-17 NOTE — Progress Notes (Signed)
  Speech Language Pathology Treatment: Dysphagia  Patient Details Name: Jaclyn Day MRN: [id number] DOB: 1946-09-06 Today's Date: 08/17/2019 Time: [id number] SLP Time Calculation (min) (ACUTE ONLY): 15 min  Assessment / Plan / Recommendation Clinical Impression  Notes indicated pt more alert today, but at the time of my arrival she was sleeping soundly and did not attempt to talk very much upon awakening.  Her partner was at the bedside. She was repositioned to optimize success/participation. Pt demonstrated definite improvements from prior sessions per chart review - she was more participatory, followed commands intermittently, and demonstrated anticipation/readiness for approaching spoon as well as active manipulation of ice.  Limited attempts to speak were notable for weak/aphonic voice.  Provided with trials of ice chips - 100% of trials led to immediate coughing.  RR reaching mid-30s consistently with mildly increased work of breathing.  Pt continues to demonstrate consistent s/s of aspiration.  Recommend continuing NPO - anticipate improvements in swallow with concomitant improvement in MS and time post-extubation.    HPI HPI: Pt is a 73 yo F admitted for witnessed out of hospital cardiac arrest, unclear etiology, with field ROSC, resultant respiratory failure and encephalopathy s/p TTM; ETT 10/22-11/1. CRRT initiated 10/30. PMH: unilateral congenital absence of kidney, Afib, HTN, CKD, CHF, anemia, mood disorder      SLP Plan  Continue with current plan of care       Recommendations  Diet recommendations: NPO Medication Administration: Via alternative means                Oral Care Recommendations: Oral care QID SLP Visit Diagnosis: Dysphagia, unspecified (R13.10) Plan: Continue with current plan of care       GO                Juan Quam Laurice 08/17/2019, 2:52 PM  Randye Treichler L. Tivis Ringer, [place] Office number  [phone] Pager [phone]

## 2019-08-17 NOTE — Progress Notes (Addendum)
Progress Note  Patient Name: KEYANDA HETMAN Date of Encounter: 08/17/2019 Primary Cardiologist: Ena Dawley, MD   Subjective   O/N events: Levophed stopped.   Mrs. Nieder was seen and evaluated at bedside. She was more alert today, was stating that she wanted to go home. Was able to tell me where she was.  She denied any chest pain, shortness of breath, headaches, or dizziness.  Inpatient Medications    Scheduled Meds: . amiodarone  200 mg Per Tube Daily  . ARIPiprazole  10 mg Per Tube BID  . atorvastatin  20 mg Per NG tube Daily  . chlorhexidine  15 mL Mouth Rinse BID  . Chlorhexidine Gluconate Cloth  6 each Topical Daily  . Chlorhexidine Gluconate Cloth  6 each Topical Q0600  . feeding supplement (PRO-STAT SUGAR FREE 64)  30 mL Per Tube Daily  . insulin aspart  0-9 Units Subcutaneous Q4H  . mouth rinse  15 mL Mouth Rinse BID  . midodrine  10 mg Oral TID WC  . pantoprazole (PROTONIX) IV  40 mg Intravenous Q24H  . sodium chloride flush  10-40 mL Intracatheter Q12H  . Thrombi-Pad  1 each Topical Once   Continuous Infusions: . sodium chloride Stopped (08/15/19 1325)  . sodium chloride    . sodium chloride    .  ceFAZolin (ANCEF) IV Stopped (08/16/19 2241)  . feeding supplement (OSMOLITE 1.5 CAL) 50 mL/hr at 08/17/19 0600  . heparin 1,150 Units/hr (08/17/19 0600)  . norepinephrine (LEVOPHED) Adult infusion Stopped (08/16/19 0830)  . valproate sodium 250 mg (08/17/19 0544)   PRN Meds: sodium chloride, sodium chloride, acetaminophen (TYLENOL) oral liquid 160 mg/5 mL, alteplase, fentaNYL (SUBLIMAZE) injection, heparin, hydrALAZINE, lidocaine (PF), lidocaine-prilocaine, pentafluoroprop-tetrafluoroeth, sodium chloride flush   Vital Signs    Vitals:   08/17/19 0331 08/17/19 0353 08/17/19 0500 08/17/19 0600  BP:   121/67 124/72  Pulse:   91 94  Resp:   (!) 24 (!) 27  Temp: 98.7 F (37.1 C)     TempSrc: Axillary     SpO2:   100% 99%  Weight:  75.4 kg    Height:         Intake/Output Summary (Last 24 hours) at 08/17/2019 0719 Last data filed at 08/17/2019 0600 Gross per 24 hour  Intake 1948.58 ml  Output 160 ml  Net 1788.58 ml   Filed Weights   08/15/19 1130 08/16/19 0500 08/17/19 0353  Weight: 75.2 kg 73.9 kg 75.4 kg    Telemetry    Atrial fibrillation, occasional PVCs- Personally Reviewed  ECG    None today   Physical Exam   General: Awake, fatigued and no distress HEENT: sclera clear, anicteric Heart: S1, S2 normal, no murmur, rub or gallop, irregular rate and rhythm Lungs: Bilateral rhonchi, mildly increased work of breathing Abdomen: abdomen is soft without significant tenderness, masses, organomegaly or guarding Extremities: extremities normal, atraumatic, no cyanosis or edema Neurology: Awake, not oriented, no verbal response, wiggles toes when asked but not following any other commands Psychiatry: Normal mood and affect  Labs    Chemistry Recent Labs  Lab 08/12/19 1606 08/13/19 0531  08/15/19 0331 08/15/19 1130 08/15/19 2040 08/16/19 0500 08/17/19 0222  NA 137 137  136   < > 139  --  134* 135 135  K 4.4 4.3  4.3   < > 4.7  --  3.8 4.1 4.6  CL 101 100  100   < > 104  --   --  99  98  CO2 27 27  26    < > 24  --   --  26 24  GLUCOSE 131* 138*  137*   < > 102*  --   --  122* 115*  BUN 15 11  10    < > 33*  --   --  25* 47*  CREATININE 1.15* 0.79  0.90   < > 2.72*  --   --  2.09* 2.99*  CALCIUM 8.1* 8.6*  8.7*   < > 8.8*  --   --  8.3* 8.4*  PROT  --   --   --   --  5.5*  --   --   --   ALBUMIN 1.8* 2.1*  --   --  1.9*  --   --   --   AST  --   --   --   --  16  --   --   --   ALT  --   --   --   --  <5  --   --   --   ALKPHOS  --   --   --   --  97  --   --   --   BILITOT  --   --   --   --  0.7  --   --   --   GFRNONAA 47* >60  >60   < > 17*  --   --  23* 15*  GFRAA 55* >60  >60   < > 19*  --   --  27* 17*  ANIONGAP 9 10  10    < > 11  --   --  10 13   < > = values in this interval not displayed.      Hematology Recent Labs  Lab 08/14/19 0458 08/15/19 0331 08/15/19 2040 08/16/19 0500  WBC 22.5* 25.7*  --  21.6*  RBC 2.77* 2.73*  --  2.90*  HGB 8.0* 7.8* 8.5* 8.2*  HCT 25.9* 26.1* 25.0* 27.8*  MCV 93.5 95.6  --  95.9  MCH 28.9 28.6  --  28.3  MCHC 30.9 29.9*  --  29.5*  RDW 17.5* 17.7*  --  17.5*  PLT 253 287  --  371    Cardiac EnzymesNo results for input(s): TROPONINI in the last 168 hours. No results for input(s): TROPIPOC in the last 168 hours.   BNPNo results for input(s): BNP, PROBNP in the last 168 hours.   DDimer No results for input(s): DDIMER in the last 168 hours.   Radiology    Ct Head Wo Contrast  Result Date: 08/15/2019 CLINICAL DATA:  Acute change in mental status. EXAM: CT HEAD WITHOUT CONTRAST TECHNIQUE: Contiguous axial images were obtained from the base of the skull through the vertex without intravenous contrast. COMPARISON:  08/03/2019 FINDINGS: Brain: No evidence of acute infarction, hemorrhage, hydrocephalus, extra-axial collection or mass lesion/mass effect. Age related atrophy and chronic microvascular ischemic changes are noted. Vascular: No hyperdense vessel or unexpected calcification. Skull: Normal. Negative for fracture or focal lesion. Sinuses/Orbits: There is pansinus mucosal thickening. The mastoid air cells are clear. Other: None. IMPRESSION: 1. No acute intracranial abnormality. 2. Age related atrophy and chronic microvascular ischemic changes. 3. Pansinusitis. Electronically Signed   By: Constance Holster M.D.   On: 08/15/2019 23:53    Cardiac Studies   TTE 08/03/19 1. Left ventricular ejection fraction, by visual estimation, is 20 to 25%. The left ventricle has severely decreased  function. Normal left ventricular size. There is moderately increased left ventricular hypertrophy. Severe global hypokinesis. 2. Left ventricular diastolic function could not be evaluated pattern of LV diastolic filling. 3. Global right ventricle has mildly  reduced systolic function.The right ventricular size is normal. No increase in right ventricular wall thickness. 4. Left atrial size was severely dilated. 5. Right atrial size was normal. 6. The mitral valve is abnormal. Mild mitral valve regurgitation. 7. The tricuspid valve is grossly normal. Tricuspid valve regurgitation is trivial. 8. The aortic valve is tricuspid Aortic valve regurgitation was not visualized by color flow Doppler. 9. The pulmonic valve was grossly normal. Pulmonic valve regurgitation is not visualized by color flow Doppler. 10. Normal pulmonary artery systolic pressure. 11. The inferior vena cava is normal in size with <50% respiratory variability, suggesting right atrial pressure of 8 mmHg. (on vent) 12. The interatrial septum was not well visualized.  TEE 08/09/19 IMPRESSION:   1. Small filamentous mass on the aortic side of the left coronary cusp, most consistent with vegetation given the clinical picture of staph bacteremia. 2. No LAA thrombus 3. Negative for PFO by color doppler 4. LVEF improved to 55-60% - possible mild anterior hypokinesis.   Patient Profile     73 y.o. female with a history of HFrEF, HTN, paroxysmal A fib, IDDM, CKD stage 4 s/p nephrectomy who presented after a out of hospital arrest, found to have staph bacteremia/endocarditis, respiratory failure, renal failure, and atrial fibrillation.   Assessment & Plan    1. Paroxysmal atrial fibrillation: Had gone into A fib with RVR that required cardizem drip, this was discontinued due to hypotension. She had been on levophed however this has been weaned off. Mildly tachycardic on oral medications. Still on heparin drip. Cortrac in place.  She is more awake and alert today.  Can transition to oral anticoagulant such as Eliquis.  -Increase amiodarone to 400 mg daily -Switch heparin drip to eliquis  2. Acute respiratory failure: S/p extubation 07/14/19. Currently on 2L [place]. Oxygenating well.  Wean as tolerated.  3. Acute encephalopathy:  CT head showed no acute intracranial abnormalities. ABG was unremarkable. TSH 3.5. Valproic acid level 29. Mental status has improved today, able to follow commands. Unclear etiology.  4. Acute on chronic systolic heart failure: TTE on 10/22 showed decreased EF of 20-25%. TEE on 10/28 showed EF improved to 55-60% with anterior hypokinesis.  -Will need to have further evaluation for CAD however will hold off in setting of AKI.  5. SBE: Currently on a prolonged course of antibiotics, cefazolin. On review of TEE it's unclear if there is a vegetation vs a lambls excrescences. End date of antibiotics 6 weeks from last negative culture.  -ID following, appreciate recommendations -Continue antibiotics per ID 6. AoCKD: Remain oliguric, currently on iHD. Cr today 2.99 from 2.09. No HD yesterday. Nephro following.   For questions or updates, please contact [place] Please consult [email or web address] for contact info under Cardiology/STEMI.   Signed, Asencion Noble, MD  08/17/2019, 7:19 AM   I have seen and examined the patient along with Asencion Noble, MD   I have reviewed the chart, notes and new data.  I agree with PA/NP's note.  Key new complaints: Much more alert today, keeps repeating that she wants to go home, but clearly remains disoriented Key examination changes: Irregular rhythm with mild tachycardia Key new findings / data: Atrial fibrillation with mild RVR on telemetry  PLAN: Increase amiodarone to 400 mg once daily. She  developed hypotension with diltiazem and may do the same on beta-blockers. Switch to oral anticoagulant (after plans for HD access are complete.).  Sanda Klein, MD, [place] [phone] 08/17/2019, 9:30 AM

## 2019-08-17 NOTE — Progress Notes (Signed)
Jaclyn Day  Assessment/ Plan: Pt is a 73 y.o. yo female with out of hospital cardiac arrest CPR for 10 minutes, history of CKD stage IV, CHF.  She was a started dialysis on 10/25 via right IJ catheter.  Urine culture with E. coli, CRRT initiated on 10/30/120.  #AKI on CKD stage IV: CKD due to hypertension.  AKI likely ATN in the setting of shock, cardiac arrest and bacteremia.  She was on CRRT since 10/30-11/1.   -Kidney ultrasound showed solitary right kidney with cortical thinning and cysts. -Remains oliguric urine output only 110 cc.  Last HD on 11/3.  Hold off on dialysis today and watch for renal recovery and monitor urine output.  If remains oliguric by tomorrow we will probably need to place permanent HD catheter and continue dialysis.  #Cardiac arrest, acute on chronic systolic CHF: EF [id number] by echo, cardiology is following.  EF improved in TEE.  #MSSA bacteremia: Continue cefazolin. End date six weeks from last negative culture 08/07/19 - 09/18/19.  #Hypotension: Currently off pressor.  Blood pressure is better.  Continue midodrine.  #Encephalopathy: He was alert awake and following simple commands this morning.  #Acute respiratory failure with hypoxia: Pulmonary is following.  On nasal cannula.  #Paroxysmal atrial fibrillation: Cardiology following.  On amiodarone and heparin drip.  Subjective: Seen and examined ICU.  She opens eyes and follows simple command.  Denies nausea vomiting chest pain.  Review of system is limited.  Off pressors.  Urine output only 110 cc. Objective Vital signs in last 24 hours: Vitals:   08/17/19 0353 08/17/19 0500 08/17/19 0600 08/17/19 0745  BP:  121/67 124/72   Pulse:  91 94   Resp:  (!) 24 (!) 27   Temp:    98.6 F (37 C)  TempSrc:    Oral  SpO2:  100% 99%   Weight: 75.4 kg     Height:       Weight change: 0.2 kg  Intake/Output Summary (Last 24 hours) at 08/17/2019 0845 Last data filed at 08/17/2019  0600 Gross per 24 hour  Intake 1721.08 ml  Output 160 ml  Net 1561.08 ml       Labs: Basic Metabolic Panel: Recent Labs  Lab 08/15/19 0331  08/15/19 2040 08/16/19 0500 08/16/19 1630 08/17/19 0222  NA 139  --  134* 135  --  135  K 4.7  --  3.8 4.1  --  4.6  CL 104  --   --  99  --  98  CO2 24  --   --  26  --  24  GLUCOSE 102*  --   --  122*  --  115*  BUN 33*  --   --  25*  --  47*  CREATININE 2.72*  --   --  2.09*  --  2.99*  CALCIUM 8.8*  --   --  8.3*  --  8.4*  PHOS 5.2*   < >  --  4.8* 4.5 4.6   < > = values in this interval not displayed.   Liver Function Tests: Recent Labs  Lab 08/12/19 1606 08/13/19 0531 08/15/19 1130  AST  --   --  16  ALT  --   --  <5  ALKPHOS  --   --  97  BILITOT  --   --  0.7  PROT  --   --  5.5*  ALBUMIN 1.8* 2.1* 1.9*   No results for  input(s): LIPASE, AMYLASE in the last 168 hours. No results for input(s): AMMONIA in the last 168 hours. CBC: Recent Labs  Lab 08/12/19 0305 08/13/19 0531  08/14/19 0458 08/15/19 0331 08/15/19 2040 08/16/19 0500  WBC 21.6* 29.0*  --  22.5* 25.7*  --  21.6*  HGB 9.1* 9.1*   < > 8.0* 7.8* 8.5* 8.2*  HCT 28.6* 29.9*   < > 25.9* 26.1* 25.0* 27.8*  MCV 89.9 93.1  --  93.5 95.6  --  95.9  PLT 257 251  --  253 287  --  371   < > = values in this interval not displayed.   Cardiac Enzymes: No results for input(s): CKTOTAL, CKMB, CKMBINDEX, TROPONINI in the last 168 hours. CBG: Recent Labs  Lab 08/16/19 1523 08/16/19 2028 08/17/19 0021 08/17/19 0329 08/17/19 0741  GLUCAP 117* 137* 123* 120* 126*    Iron Studies: No results for input(s): IRON, TIBC, TRANSFERRIN, FERRITIN in the last 72 hours. Studies/Results: Ct Head Wo Contrast  Result Date: 08/15/2019 CLINICAL DATA:  Acute change in mental status. EXAM: CT HEAD WITHOUT CONTRAST TECHNIQUE: Contiguous axial images were obtained from the base of the skull through the vertex without intravenous contrast. COMPARISON:  08/03/2019 FINDINGS:  Brain: No evidence of acute infarction, hemorrhage, hydrocephalus, extra-axial collection or mass lesion/mass effect. Age related atrophy and chronic microvascular ischemic changes are noted. Vascular: No hyperdense vessel or unexpected calcification. Skull: Normal. Negative for fracture or focal lesion. Sinuses/Orbits: There is pansinus mucosal thickening. The mastoid air cells are clear. Other: None. IMPRESSION: 1. No acute intracranial abnormality. 2. Age related atrophy and chronic microvascular ischemic changes. 3. Pansinusitis. Electronically Signed   By: Constance Holster M.D.   On: 08/15/2019 23:53    Medications: Infusions: . sodium chloride Stopped (08/15/19 1325)  . sodium chloride    . sodium chloride    .  ceFAZolin (ANCEF) IV Stopped (08/16/19 2241)  . feeding supplement (OSMOLITE 1.5 CAL) 50 mL/hr at 08/17/19 0600  . heparin 1,150 Units/hr (08/17/19 0600)  . norepinephrine (LEVOPHED) Adult infusion Stopped (08/16/19 0830)  . valproate sodium 250 mg (08/17/19 0544)    Scheduled Medications: . amiodarone  400 mg Per Tube Daily  . ARIPiprazole  10 mg Per Tube BID  . atorvastatin  20 mg Per NG tube Daily  . chlorhexidine  15 mL Mouth Rinse BID  . Chlorhexidine Gluconate Cloth  6 each Topical Daily  . Chlorhexidine Gluconate Cloth  6 each Topical Q0600  . feeding supplement (PRO-STAT SUGAR FREE 64)  30 mL Per Tube Daily  . insulin aspart  0-9 Units Subcutaneous Q4H  . mouth rinse  15 mL Mouth Rinse BID  . midodrine  10 mg Oral TID WC  . pantoprazole (PROTONIX) IV  40 mg Intravenous Q24H  . sodium chloride flush  10-40 mL Intracatheter Q12H  . Thrombi-Pad  1 each Topical Once    have reviewed scheduled and prn medications.  Physical Exam: General: Alert awake, not in distress Heart:RRR, s1s2 nl Lungs: Basal some crackles, no wheezing, no increased work of breathing Abdomen:soft, Non-tender, non-distended Extremities:No edema Dialysis Access: Right IJ temporary  catheter, site clean  Dron Prasad Bhandari 08/17/2019,8:45 AM  LOS: 14 days  Pager: [phone]

## 2019-08-17 NOTE — Progress Notes (Signed)
[facility] for Infectious Disease  Date of Admission:  08/03/2019      Total days of antibiotics 15  Cefazolin day 12           ASSESSMENT: Jaclyn Day is a 73 y.o. female female with cardiac arrest outside the hospital (unclear etiology). Found to have MSSA bacteremia and significantly depressed LVEF (20-25%) in the setting of AFib RVR following arrest. 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   Possible Endocarditis vs Lambls Excrescences of native Aortic Valve - Blood cultures on 10/29 no growth, final. Continue Cefazolin for 6 weeks 09/18/19. Cardiology is following. Likely will need repeat echo at end of therapy to re-evaluate. Will schedule a follow up in ID clinic after she completes antibiotics.   Lethargy - improved. Verbal today and able to answer questions.   Leukocytosis improving as well as overall clinical picture.   Acute on Chronic Kidney Disease - looks as if she will have long term HD needs. Continue cefazolin daily for now until this is more clear; then can switch to 3x/week dosing after HD. She has HD line only for now.   Dispo - would suspect she needs SNF at discharge with deconditioning and medical needs.      PLAN: 1. Continue Cefazolin x 6 weeks 2. OPAT as outlined below.   OPAT ORDERS:  Diagnosis: Bacteremia, possible native AV endocarditis   Culture Result: MSSA   Allergies  Allergen Reactions   Codeine Other (See Comments)    Reaction not recalled by family- was told to never take this    Discharge antibiotics: Cefazolin (daily for now vs after HD if she is dependent)   Duration: 6 weeks   End Date: December 7th   [place] and Maintenance Per Protocol **HD line to be maintained by dialysis team   Labs weekly while on IV antibiotics: _x_ CBC with differential __ BMP __ BMP TWICE WEEKLY** _x_ CMP __ CRP __ ESR __ Vancomycin trough  Fax weekly labs to [phone]  Clinic Follow Up  Appt: December 15th @ 10:30 a.m. with Dr. Linus Salmons     Principal Problem:   MSSA bacteremia Active Problems:   Cardiac arrest ([place])   Pressure injury of skin   Acute respiratory failure with hypoxia (HCC)   AKI (acute kidney injury) ([place])   Elevated troponin   Persistent atrial fibrillation (HCC)   Acute on chronic combined systolic and diastolic CHF (congestive heart failure) (HCC)   Acute bacterial endocarditis   Malnutrition of moderate degree    amiodarone  400 mg Per Tube Daily   ARIPiprazole  10 mg Per Tube BID   atorvastatin  20 mg Per NG tube Daily   chlorhexidine  15 mL Mouth Rinse BID   Chlorhexidine Gluconate Cloth  6 each Topical Daily   Chlorhexidine Gluconate Cloth  6 each Topical Q0600   feeding supplement (PRO-STAT SUGAR FREE 64)  30 mL Per Tube Daily   insulin aspart  0-9 Units Subcutaneous Q4H   mouth rinse  15 mL Mouth Rinse BID   midodrine  10 mg Per Tube TID WC   pantoprazole (PROTONIX) IV  40 mg Intravenous Q24H   sodium chloride flush  10-40 mL Intracatheter Q12H   Thrombi-Pad  1 each Topical Once    SUBJECTIVE: Answering some questions appropriately. Quiet voice.   Interval Addition -  O2 down to 2LPM Improving; orders to transfer to [facility] today  Likely will need  chronic HD as she continues to be oliguric   Review of Systems: Review of Systems  Constitutional: Negative for chills, fever and malaise/fatigue.  Respiratory: Negative for cough, sputum production and shortness of breath.   Cardiovascular: Negative for chest pain and leg swelling.  Gastrointestinal: Negative for abdominal pain and vomiting.  Genitourinary:       Oliguric   Musculoskeletal: Negative for back pain.  Skin: Negative for rash.     Allergies  Allergen Reactions   Codeine Other (See Comments)    Reaction not recalled by family- was told to never take this    OBJECTIVE: Vitals:   08/17/19 0353 08/17/19 0500 08/17/19 0600 08/17/19 0745  BP:  121/67  124/72   Pulse:  91 94   Resp:  (!) 24 (!) 27   Temp:    98.6 F (37 C)  TempSrc:    Oral  SpO2:  100% 99%   Weight: 75.4 kg     Height:       Body mass index is 26.83 kg/m.  Physical Exam Constitutional:      Comments: Resting upright in bed. More alert today. Able to communicate.   HENT:     Mouth/Throat:     Mouth: Mucous membranes are dry.  Eyes:     General: No scleral icterus.    Pupils: Pupils are equal, round, and reactive to light.  Neck:     Comments: R IJ HD cath clean and dry  Cardiovascular:     Rate and Rhythm: Normal rate and regular rhythm.     Heart sounds: No murmur.     Comments: NSR on tele Pulmonary:     Effort: Pulmonary effort is normal.     Breath sounds: No rhonchi.     Comments: Shallow inspiratory effort. POx 94% 2 LPM [place] Abdominal:     General: Bowel sounds are normal. There is no distension.  Musculoskeletal:        General: No swelling.  Skin:    General: Skin is warm and dry.     Capillary Refill: Capillary refill takes less than 2 seconds.     Comments: Scattered areas of ecchymosis   Neurological:     Mental Status: She is alert.     Comments: Asking about her boyfriend [place]. Knows she is in the hospital.      Lab Results Lab Results  Component Value Date   WBC 21.6 (H) 08/16/2019   HGB 8.2 (L) 08/16/2019   HCT 27.8 (L) 08/16/2019   MCV 95.9 08/16/2019   PLT 371 08/16/2019    Lab Results  Component Value Date   CREATININE 2.99 (H) 08/17/2019   BUN 47 (H) 08/17/2019   NA 135 08/17/2019   K 4.6 08/17/2019   CL 98 08/17/2019   CO2 24 08/17/2019    Lab Results  Component Value Date   ALT <5 08/15/2019   AST 16 08/15/2019   ALKPHOS 97 08/15/2019   BILITOT 0.7 08/15/2019     Microbiology: Recent Results (from the past 240 hour(s))  Culture, blood (routine x 2)     Status: None   Collection Time: 08/07/19  6:29 PM   Specimen: BLOOD RIGHT ARM  Result Value Ref Range Status   Specimen Description BLOOD RIGHT ARM   Final   Special Requests   Final    BOTTLES DRAWN AEROBIC ONLY Blood Culture results may not be optimal due to an inadequate volume of blood received in culture bottles  Culture   Final    NO GROWTH 5 DAYS Performed at [facility], [place] [address], [place], [place] [address]    Report Status 08/12/2019 FINAL  Final  Culture, blood (routine x 2)     Status: None   Collection Time: 08/07/19  6:34 PM   Specimen: BLOOD RIGHT ARM  Result Value Ref Range Status   Specimen Description BLOOD RIGHT ARM  Final   Special Requests   Final    BOTTLES DRAWN AEROBIC ONLY Blood Culture results may not be optimal due to an inadequate volume of blood received in culture bottles   Culture   Final    NO GROWTH 5 DAYS Performed at [facility], [place] [address], [place], [place] [address]    Report Status 08/12/2019 FINAL  Final  Culture, blood (routine x 2)     Status: None   Collection Time: 08/10/19 10:16 AM   Specimen: BLOOD RIGHT HAND  Result Value Ref Range Status   Specimen Description BLOOD RIGHT HAND  Final   Special Requests AEROBIC BOTTLE ONLY Blood Culture adequate volume  Final   Culture   Final    NO GROWTH 5 DAYS Performed at [facility], [place] [address] [place], [place] [address]    Report Status 08/15/2019 FINAL  Final  Culture, blood (routine x 2)     Status: None   Collection Time: 08/10/19 10:17 AM   Specimen: BLOOD RIGHT HAND  Result Value Ref Range Status   Specimen Description BLOOD RIGHT HAND  Final   Special Requests AEROBIC BOTTLE ONLY Blood Culture adequate volume  Final   Culture   Final    NO GROWTH 5 DAYS Performed at [facility], [place] [address], [place], [place] [address]    Report Status 08/15/2019 FINAL  Final     Janene Madeira, MSN, NP-C [facility] [email or web address] Pager: [phone] Office: [phone] [place]: [phone]

## 2019-08-17 NOTE — Progress Notes (Signed)
PROGRESS NOTE    Jaclyn Day  [id number] DOB: 1945-12-07 DOA: 08/03/2019 PCP: Cyndi Bender, PA-C    Brief Narrative:  Patient is 73 year old with history of congenital unilateral kidney, stage IV chronic kidney disease with baseline creatinine about 2.2-2.8, hypertension, persistent A. fib on anticoagulation with Eliquis, chronic diastolic heart failure, history of cardioversion for A. fib admitted to ICU with cardiac arrest.  Patient from home. 10/22 Admitted post arrest, intubated, initiation of targeted temperature management 36 celsius.   10/23 + 800 ml I/O, TTM, Weaned off Levophed overnight, ECHO: LVEF: 20-25%, global hypokinesis, RV midly reduced function with normal size, mild MR, normal pulmonary pressures.  Lactate cleared 10/24 - 40% fio2, Oliguric v Anuric. On amio gtt, heparin gtt, neo gtt . Being weaned off neo gtt. Rewarmed to 37. Non purposeful only (not on sedation). Growing e. colii in urine from admission. 10/25 - staph identified in blood and vanc started. Still with poor Ur OP. On amio gtt. Off leveophed an dneo. On Heparin gtt.  iHD w/ 1L off 10/26-  following simple commands, placed on precedex for agitation, febrile- re-cultured and femoral CVL discontinued  10/28- converted to NSR, TEE  10/30 - CRRT for volume removal Blood culture 10/22 >> MSSA  1/4 COVID 10/22 >> Negative RVP 10/22 >> Negative Urine 10/22 - E colii 10/26 BCx 2 >> neg  Assessment & Plan:   Principal Problem:   MSSA bacteremia Active Problems:   Cardiac arrest ([place])   Pressure injury of skin   Acute respiratory failure with hypoxia (HCC)   AKI (acute kidney injury) ([place])   Elevated troponin   Persistent atrial fibrillation (HCC)   Acute on chronic combined systolic and diastolic CHF (congestive heart failure) (HCC)   Acute bacterial endocarditis   Malnutrition of moderate degree  Acute hypoxic respiratory failure due to cardiac arrest and pulmonary edema: Extubated to nasal  cannula oxygen.  Keep on oxygen to keep saturation more than 90%.  Chest physiotherapy and incentive spirometry as patient able to tolerate.  Witnessed cardiac arrest in the setting of history of chronic atrial fibrillation and coronary artery disease: Patient treated with hypothermia protocol.  Followed by cardiology.  Developed intermittent rapid A. fib.  Blood pressures low to tolerate multiple medications.  Initially treated with Cardizem drip, needed Levophed for hypotension and now weaned off. Currently on amiodarone 400 mg daily. Remains on heparin infusion, continue heparin infusion until catheter plan finalized.  Switch to Eliquis after final plans.  Acute metabolic encephalopathy: In the setting of cardiac arrest hypothermia and prolonged hospitalization.  CT head 08/16/2019 stable.  Some clinical improvement today.  Acute on chronic systolic heart failure: TTE on 10/22 with EF 20%.  Repeat TEE with improved ejection fraction to 60%.  CAD evaluation pending renal improvement.  MSSA bacteremia with suspected aortic valve vegetation: Repeat cultures negative.  Seen by infectious disease.  Recommended cefazolin until 12/7.  AKI on CKD stage IV: Suspected AKI likely ATN in the setting of shock cardiac arrest and bacteremia.  Started on CRRT.  Kidney ultrasound with no obstruction. Urine output 110 cc last 24 hours. Followed by nephrology. Patient will probably go on intermittent hemodialysis.  Diet: Currently on tube feeding via core track tube.  Continue.  Speech to see as patient is more awake today.   DVT prophylaxis: Heparin infusion Code Status: Full code Family Communication: None Disposition Plan: Transfer to telemetry   Consultants:   Nephrology  Cardiology  PCCM  Procedures:   TEE  CRRT  Antimicrobials:  Rocephin 10/22 >> 10/25 Vanc (staph in blood ) 10/24  Cefazolin 10/25 >>   Subjective: Patient seen and examined.  Today, patient is more alert.  She has  difficult phonation.  However she tells me that she wants to go home.  I discussed with her ongoing issues and she nodded her head that she understands it.   Objective: Vitals:   08/17/19 0900 08/17/19 1000 08/17/19 1100 08/17/19 1137  BP: 112/68 112/61 112/63 107/65  Pulse: (!) 104 (!) 110 89 93  Resp: (!) 38 19 (!) 33   Temp:    98.8 F (37.1 C)  TempSrc:    Oral  SpO2: 98% 100% 94% 95%  Weight:      Height:        Intake/Output Summary (Last 24 hours) at 08/17/2019 1304 Last data filed at 08/17/2019 1000 Gross per 24 hour  Intake 1395.43 ml  Output 135 ml  Net 1260.43 ml   Filed Weights   08/15/19 1130 08/16/19 0500 08/17/19 0353  Weight: 75.2 kg 73.9 kg 75.4 kg    Examination:  General exam: Appears calm and comfortable , chronically sick looking.  Not in any distress.  On 2 to 3 L of oxygen.  Core track with tube feeding infusing. Respiratory system: Clear to auscultation. Respiratory effort normal.  Mostly conducted airway sounds.  Some basal crackles. Cardiovascular system: S1 & S2 heard, RRR. No JVD, murmurs, rubs, gallops or clicks. No pedal edema. Gastrointestinal system: Abdomen is nondistended, soft and nontender. No organomegaly or masses felt. Normal bowel sounds heard. Central nervous system: Alert and oriented.  Follows commands.  Due to difficult phonation difficult to understand everything.  Generalized weakness of all extremities. Extremities: Symmetric but weak. Skin: No rashes, lesions or ulcers Psychiatry: Judgement and insight appear normal. Mood & affect anxious and flat.     Data Reviewed: I have personally reviewed following labs and imaging studies  CBC: Recent Labs  Lab 08/12/19 0305 08/13/19 0531 08/13/19 2248 08/14/19 0458 08/15/19 0331 08/15/19 2040 08/16/19 0500  WBC 21.6* 29.0*  --  22.5* 25.7*  --  21.6*  HGB 9.1* 9.1* 8.5* 8.0* 7.8* 8.5* 8.2*  HCT 28.6* 29.9* 25.0* 25.9* 26.1* 25.0* 27.8*  MCV 89.9 93.1  --  93.5 95.6  --  95.9   PLT 257 251  --  253 287  --  123456   Basic Metabolic Panel: Recent Labs  Lab 08/13/19 0531  08/14/19 0458  08/15/19 0331 08/15/19 1713 08/15/19 2040 08/16/19 0500 08/16/19 1630 08/17/19 0222  NA 137  136   < > 136  --  139  --  134* 135  --  135  K 4.3  4.3   < > 4.6  --  4.7  --  3.8 4.1  --  4.6  CL 100  100  --  101  --  104  --   --  99  --  98  CO2 27  26  --  24  --  24  --   --  26  --  24  GLUCOSE 138*  137*  --  116*  --  102*  --   --  122*  --  115*  BUN 11  10  --  21  --  33*  --   --  25*  --  47*  CREATININE 0.79  0.90  --  1.84*  --  2.72*  --   --  2.09*  --  2.99*  CALCIUM 8.6*  8.7*  --  8.7*  --  8.8*  --   --  8.3*  --  8.4*  MG 2.3  --   --   --  2.5* 1.9  --  2.2 2.1 2.2  PHOS 1.5*  --   --    < > 5.2* 3.0  --  4.8* 4.5 4.6   < > = values in this interval not displayed.   GFR: Estimated Creatinine Clearance: 17.6 mL/min (A) (by C-G formula based on SCr of 2.99 mg/dL (H)). Liver Function Tests: Recent Labs  Lab 08/11/19 1627 08/12/19 0305 08/12/19 1606 08/13/19 0531 08/15/19 1130  AST  --   --   --   --  16  ALT  --   --   --   --  <5  ALKPHOS  --   --   --   --  97  BILITOT  --   --   --   --  0.7  PROT  --   --   --   --  5.5*  ALBUMIN 1.9* 1.9* 1.8* 2.1* 1.9*   No results for input(s): LIPASE, AMYLASE in the last 168 hours. No results for input(s): AMMONIA in the last 168 hours. Coagulation Profile: No results for input(s): INR, PROTIME in the last 168 hours. Cardiac Enzymes: No results for input(s): CKTOTAL, CKMB, CKMBINDEX, TROPONINI in the last 168 hours. BNP (last 3 results) No results for input(s): PROBNP in the last 8760 hours. HbA1C: No results for input(s): HGBA1C in the last 72 hours. CBG: Recent Labs  Lab 08/16/19 2028 08/17/19 0021 08/17/19 0329 08/17/19 0741 08/17/19 1140  GLUCAP 137* 123* 120* 126* 115*   Lipid Profile: No results for input(s): CHOL, HDL, LDLCALC, TRIG, CHOLHDL, LDLDIRECT in the last 72  hours. Thyroid Function Tests: Recent Labs    08/15/19 1130  TSH 3.554   Anemia Panel: No results for input(s): VITAMINB12, FOLATE, FERRITIN, TIBC, IRON, RETICCTPCT in the last 72 hours. Sepsis Labs: No results for input(s): PROCALCITON, LATICACIDVEN in the last 168 hours.  Recent Results (from the past 240 hour(s))  Culture, blood (routine x 2)     Status: None   Collection Time: 08/07/19  6:29 PM   Specimen: BLOOD RIGHT ARM  Result Value Ref Range Status   Specimen Description BLOOD RIGHT ARM  Final   Special Requests   Final    BOTTLES DRAWN AEROBIC ONLY Blood Culture results may not be optimal due to an inadequate volume of blood received in culture bottles   Culture   Final    NO GROWTH 5 DAYS Performed at [facility], [place] [address] [place], [place] [address]    Report Status 08/12/2019 FINAL  Final  Culture, blood (routine x 2)     Status: None   Collection Time: 08/07/19  6:34 PM   Specimen: BLOOD RIGHT ARM  Result Value Ref Range Status   Specimen Description BLOOD RIGHT ARM  Final   Special Requests   Final    BOTTLES DRAWN AEROBIC ONLY Blood Culture results may not be optimal due to an inadequate volume of blood received in culture bottles   Culture   Final    NO GROWTH 5 DAYS Performed at [facility], [place] [address], [place], [place] [address]    Report Status 08/12/2019 FINAL  Final  Culture, blood (routine x 2)     Status: None   Collection Time: 08/10/19 10:16  AM   Specimen: BLOOD RIGHT HAND  Result Value Ref Range Status   Specimen Description BLOOD RIGHT HAND  Final   Special Requests AEROBIC BOTTLE ONLY Blood Culture adequate volume  Final   Culture   Final    NO GROWTH 5 DAYS Performed at [facility], [address] [place], [place] [address]    Report Status 08/15/2019 FINAL  Final  Culture, blood (routine x 2)     Status: None   Collection Time: 08/10/19 10:17 AM   Specimen: BLOOD RIGHT HAND  Result Value Ref Range Status    Specimen Description BLOOD RIGHT HAND  Final   Special Requests AEROBIC BOTTLE ONLY Blood Culture adequate volume  Final   Culture   Final    NO GROWTH 5 DAYS Performed at [facility], [place] [address] [place], [place] [address]    Report Status 08/15/2019 FINAL  Final         Radiology Studies: Ct Head Wo Contrast  Result Date: 08/15/2019 CLINICAL DATA:  Acute change in mental status. EXAM: CT HEAD WITHOUT CONTRAST TECHNIQUE: Contiguous axial images were obtained from the base of the skull through the vertex without intravenous contrast. COMPARISON:  08/03/2019 FINDINGS: Brain: No evidence of acute infarction, hemorrhage, hydrocephalus, extra-axial collection or mass lesion/mass effect. Age related atrophy and chronic microvascular ischemic changes are noted. Vascular: No hyperdense vessel or unexpected calcification. Skull: Normal. Negative for fracture or focal lesion. Sinuses/Orbits: There is pansinus mucosal thickening. The mastoid air cells are clear. Other: None. IMPRESSION: 1. No acute intracranial abnormality. 2. Age related atrophy and chronic microvascular ischemic changes. 3. Pansinusitis. Electronically Signed   By: Constance Holster M.D.   On: 08/15/2019 23:53        Scheduled Meds: . amiodarone  400 mg Per Tube Daily  . ARIPiprazole  10 mg Per Tube BID  . atorvastatin  20 mg Per NG tube Daily  . chlorhexidine  15 mL Mouth Rinse BID  . Chlorhexidine Gluconate Cloth  6 each Topical Daily  . Chlorhexidine Gluconate Cloth  6 each Topical Q0600  . feeding supplement (PRO-STAT SUGAR FREE 64)  30 mL Per Tube Daily  . insulin aspart  0-9 Units Subcutaneous Q4H  . mouth rinse  15 mL Mouth Rinse BID  . midodrine  10 mg Per Tube TID WC  . pantoprazole (PROTONIX) IV  40 mg Intravenous Q24H  . sodium chloride flush  10-40 mL Intracatheter Q12H  . Thrombi-Pad  1 each Topical Once   Continuous Infusions: . sodium chloride Stopped (08/15/19 1325)  . sodium chloride     . sodium chloride    .  ceFAZolin (ANCEF) IV Stopped (08/16/19 2241)  . feeding supplement (OSMOLITE 1.5 CAL) 50 mL/hr at 08/17/19 0600  . heparin 1,150 Units/hr (08/17/19 1051)  . norepinephrine (LEVOPHED) Adult infusion Stopped (08/16/19 0830)  . valproate sodium 250 mg (08/17/19 0544)     LOS: 14 days    Time spent: 40 minutes    Barb Merino, MD [facility] Pager [phone]

## 2019-08-18 ENCOUNTER — Inpatient Hospital Stay (HOSPITAL_COMMUNITY): Payer: 59

## 2019-08-18 DIAGNOSIS — D72829 Elevated white blood cell count, unspecified: Secondary | ICD-10-CM | POA: Diagnosis not present

## 2019-08-18 DIAGNOSIS — N186 End stage renal disease: Secondary | ICD-10-CM | POA: Diagnosis not present

## 2019-08-18 DIAGNOSIS — B9561 Methicillin susceptible Staphylococcus aureus infection as the cause of diseases classified elsewhere: Secondary | ICD-10-CM | POA: Diagnosis not present

## 2019-08-18 DIAGNOSIS — R7881 Bacteremia: Secondary | ICD-10-CM | POA: Diagnosis not present

## 2019-08-18 DIAGNOSIS — I4819 Other persistent atrial fibrillation: Secondary | ICD-10-CM | POA: Diagnosis not present

## 2019-08-18 LAB — GLUCOSE, CAPILLARY
Glucose-Capillary: 120 mg/dL — ABNORMAL HIGH (ref 70–99)
Glucose-Capillary: 124 mg/dL — ABNORMAL HIGH (ref 70–99)
Glucose-Capillary: 127 mg/dL — ABNORMAL HIGH (ref 70–99)
Glucose-Capillary: 134 mg/dL — ABNORMAL HIGH (ref 70–99)
Glucose-Capillary: 135 mg/dL — ABNORMAL HIGH (ref 70–99)
Glucose-Capillary: 145 mg/dL — ABNORMAL HIGH (ref 70–99)
Glucose-Capillary: 145 mg/dL — ABNORMAL HIGH (ref 70–99)
Glucose-Capillary: 98 mg/dL (ref 70–99)

## 2019-08-18 LAB — CBC
HCT: 24 % — ABNORMAL LOW (ref 36.0–46.0)
HCT: 24.3 % — ABNORMAL LOW (ref 36.0–46.0)
Hemoglobin: 7.5 g/dL — ABNORMAL LOW (ref 12.0–15.0)
Hemoglobin: 7.5 g/dL — ABNORMAL LOW (ref 12.0–15.0)
MCH: 29.1 pg (ref 26.0–34.0)
MCH: 28.7 pg (ref 26.0–34.0)
MCHC: 31.3 g/dL (ref 30.0–36.0)
MCHC: 30.9 g/dL (ref 30.0–36.0)
MCV: 93 fL (ref 80.0–100.0)
MCV: 93.1 fL (ref 80.0–100.0)
Platelets: 384 10*3/uL (ref 150–400)
Platelets: 393 10*3/uL (ref 150–400)
RBC: 2.58 MIL/uL — ABNORMAL LOW (ref 3.87–5.11)
RBC: 2.61 MIL/uL — ABNORMAL LOW (ref 3.87–5.11)
RDW: 17.8 % — ABNORMAL HIGH (ref 11.5–15.5)
RDW: 17.6 % — ABNORMAL HIGH (ref 11.5–15.5)
WBC: 18.7 10*3/uL — ABNORMAL HIGH (ref 4.0–10.5)
WBC: 18.2 10*3/uL — ABNORMAL HIGH (ref 4.0–10.5)
nRBC: 0 % (ref 0.0–0.2)
nRBC: 0 % (ref 0.0–0.2)

## 2019-08-18 LAB — BLOOD GAS, ARTERIAL
Acid-Base Excess: 0.5 mmol/L (ref 0.0–2.0)
Bicarbonate: 24.1 mmol/L (ref 20.0–28.0)
Drawn by: 330991
FIO2: 28
O2 Saturation: 96.9 %
Patient temperature: 39.8
pCO2 arterial: 40.3 mmHg (ref 32.0–48.0)
pH, Arterial: 7.408 (ref 7.350–7.450)
pO2, Arterial: 105 mmHg (ref 83.0–108.0)

## 2019-08-18 LAB — COMPREHENSIVE METABOLIC PANEL
ALT: <5 U/L (ref 0–44)
AST: 10 U/L — ABNORMAL LOW (ref 15–41)
Albumin: 1.7 g/dL — ABNORMAL LOW (ref 3.5–5.0)
Alkaline Phosphatase: 91 U/L (ref 38–126)
Anion gap: 12 (ref 5–15)
BUN: 80 mg/dL — ABNORMAL HIGH (ref 8–23)
CO2: 23 mmol/L (ref 22–32)
Calcium: 8.4 mg/dL — ABNORMAL LOW (ref 8.9–10.3)
Chloride: 98 mmol/L (ref 98–111)
Creatinine, Ser: 3.99 mg/dL — ABNORMAL HIGH (ref 0.44–1.00)
GFR calc Af Amer: 12 mL/min — ABNORMAL LOW (ref >60)
GFR calc non Af Amer: 11 mL/min — ABNORMAL LOW (ref >60)
Glucose, Bld: 151 mg/dL — ABNORMAL HIGH (ref 70–99)
Potassium: 5.9 mmol/L — ABNORMAL HIGH (ref 3.5–5.1)
Sodium: 133 mmol/L — ABNORMAL LOW (ref 135–145)
Total Bilirubin: 0.6 mg/dL (ref 0.3–1.2)
Total Protein: 5.1 g/dL — ABNORMAL LOW (ref 6.5–8.1)

## 2019-08-18 LAB — BASIC METABOLIC PANEL
Anion gap: 11 (ref 5–15)
BUN: 67 mg/dL — ABNORMAL HIGH (ref 8–23)
CO2: 24 mmol/L (ref 22–32)
Calcium: 8.3 mg/dL — ABNORMAL LOW (ref 8.9–10.3)
Chloride: 99 mmol/L (ref 98–111)
Creatinine, Ser: 3.7 mg/dL — ABNORMAL HIGH (ref 0.44–1.00)
GFR calc Af Amer: 13 mL/min — ABNORMAL LOW (ref >60)
GFR calc non Af Amer: 12 mL/min — ABNORMAL LOW (ref >60)
Glucose, Bld: 119 mg/dL — ABNORMAL HIGH (ref 70–99)
Potassium: 5.5 mmol/L — ABNORMAL HIGH (ref 3.5–5.1)
Sodium: 134 mmol/L — ABNORMAL LOW (ref 135–145)

## 2019-08-18 LAB — MAGNESIUM: Magnesium: 2.2 mg/dL (ref 1.7–2.4)

## 2019-08-18 LAB — PHOSPHORUS: Phosphorus: 4.8 mg/dL — ABNORMAL HIGH (ref 2.5–4.6)

## 2019-08-18 LAB — HEPARIN LEVEL (UNFRACTIONATED): Heparin Unfractionated: 0.65 IU/mL (ref 0.30–0.70)

## 2019-08-18 MED ORDER — CHLORHEXIDINE GLUCONATE CLOTH 2 % EX PADS
6.0000 | MEDICATED_PAD | Freq: Every day | CUTANEOUS | Status: DC
  Administered 2019-08-18 – 2019-08-20 (×3): 6 via TOPICAL

## 2019-08-18 MED ORDER — ACETAMINOPHEN 650 MG RE SUPP
650.0000 mg | Freq: Once | RECTAL | Status: AC
  Administered 2019-08-18: 20:00:00 650 mg via RECTAL
  Filled 2019-08-18: qty 1

## 2019-08-18 NOTE — Progress Notes (Signed)
Pt's sister reported that she has a significant decline in her mental status compared to yesterday.  Noted some altered mental status change since earlier this shift. Pt was unable to respond verbally.  Spoke with Dr. Sloan Leiter who already left hospital.  Received stat ABG order and have her go to HD asap, and to call rapid response and have them call on call physician. On call physician was paged.  HD was notified.  Idolina Primer, RN

## 2019-08-18 NOTE — Progress Notes (Signed)
    [facility] for [facility]ious Disease   Reason for visit: Follow up on bacteremia  Interval History: repeat blood cultures remain negative. WBC 18.7 and trending down, remains afebrile.  She does not verbalize to me this am.    Physical Exam: Constitutional:  Vitals:   08/18/19 0625 08/18/19 0742  BP: 109/65 126/74  Pulse: 90   Resp:  (!) 34  Temp: 98.4 F (36.9 C) 98.2 F (36.8 C)  SpO2: 99% 99%   patient appears in NAD Respiratory: Normal respiratory effort; CTA B Cardiovascular: RRR GI: soft, nt, nd Neuro: alert, eyes open, does not respond to my questions.  Skin: no rashes  Review of Systems: Unable to be assessed due to mental status  Lab Results  Component Value Date   WBC 18.7 (H) 08/18/2019   HGB 7.5 (L) 08/18/2019   HCT 24.0 (L) 08/18/2019   MCV 93.0 08/18/2019   PLT 384 08/18/2019    Lab Results  Component Value Date   CREATININE 3.70 (H) 08/18/2019   BUN 67 (H) 08/18/2019   NA 134 (L) 08/18/2019   K 5.5 (H) 08/18/2019   CL 99 08/18/2019   CO2 24 08/18/2019    Lab Results  Component Value Date   ALT <5 08/15/2019   AST 16 08/15/2019   ALKPHOS 97 08/15/2019     Microbiology: Recent Results (from the past 240 hour(s))  Culture, blood (routine x 2)     Status: None   Collection Time: 08/10/19 10:16 AM   Specimen: BLOOD RIGHT HAND  Result Value Ref Range Status   Specimen Description BLOOD RIGHT HAND  Final   Special Requests AEROBIC BOTTLE ONLY Blood Culture adequate volume  Final   Culture   Final    NO GROWTH 5 DAYS Performed at [facility], [place] [address] [place], [place] [address]    Report Status 08/15/2019 FINAL  Final  Culture, blood (routine x 2)     Status: None   Collection Time: 08/10/19 10:17 AM   Specimen: BLOOD RIGHT HAND  Result Value Ref Range Status   Specimen Description BLOOD RIGHT HAND  Final   Special Requests AEROBIC BOTTLE ONLY Blood Culture adequate volume  Final   Culture   Final    NO GROWTH 5 DAYS  Performed at [facility], [place] [address], [place], [place] [address]    Report Status 08/15/2019 FINAL  Final    Impression/Plan:  1. Possible endocarditis - MSSA in blood culture.   May be fibrous strands, Lambls Excrescences but would treat for 6 weeks regardless through 12/7.    2.  ESRD - appears she will remain on dialysis so can continue with cefazolin after dialysis for the duration above.    3.  Leukocytosis - improving overall.    4.  Lethargy - intremittent and today is less interactive.  I suspect a lot related to dialysis.    I will sign off, call with any questions

## 2019-08-18 NOTE — Progress Notes (Signed)
  Speech Language Pathology Treatment: Dysphagia  Patient Details Name: ODEAN QUILLEN MRN: [id number] DOB: [birth year] Today's Date: 08/18/2019 Time: [id number] SLP Time Calculation (min) (ACUTE ONLY): 10 min  Assessment / Plan / Recommendation Clinical Impression  Pt was seen for skilled ST targeting dysphagia goals.  Pt lethargic upon therapist's arrival but awake and agreeable to participating in PO trials.  Pt was unable to coordinate acceptance of boluses orally.  Therapist presented both spoon and cup to pt's mouth and provided max cues to close mouth and swallow; however, pt was unable to do so.  Question whether this is related to current mental status versus acute respiratory decompensation.  Pt was able to swallow small amounts of thin liquids that passively flowed into her mouth but this lead to delayed coughing.  For now, recommend that pt remain NPO with alternative means of nutrition.  Pt was left in bed with bed alarm set and call bell within reach.  Continue per current plan of care.    HPI HPI: Pt is a 73 yo F admitted for witnessed out of hospital cardiac arrest, unclear etiology, with field ROSC, resultant respiratory failure and encephalopathy s/p TTM; ETT 10/22-11/1. CRRT initiated 10/30. PMH: unilateral congenital absence of kidney, Afib, HTN, CKD, CHF, anemia, mood disorder      SLP Plan  Continue with current plan of care       Recommendations  Diet recommendations: NPO Medication Administration: Via alternative means                Oral Care Recommendations: Oral care QID SLP Visit Diagnosis: Dysphagia, unspecified (R13.10) Plan: Continue with current plan of care       GO                Davelyn Gwinn, Selinda Orion 08/18/2019, 10:18 AM

## 2019-08-18 NOTE — Significant Event (Signed)
Rapid Response Event Note  Overview:Called d/t decreased neuro status since 1600. Chart says neuro status waxes and wanes however, sister at bedside and says this is drastically difference than previously. Time Called: 1902 Arrival Time: 1920 Event Type: Neurologic  Initial Focused Assessment: Pt laying in bed with eyes closed, will open eyes to pain and move all extremities to pain. Pt will turn head towards you if you speak but will not track or focus on RN. Pt will open mouth to speak when asked a question  but then stops without answering. Pupils 2 and slugglish. Skin hot and diaphoretic. T-103(Ax) PTA and 100.0(R), HR-83(A-fib), BP-134/62, RR-23, SpO2-99% on 2L [place].    Interventions: ABG-7.40/40.3/105/24.1 CBC-hgb-7.5(no drastic change) BMP-consistent with needing HD (K-5.9, creat-3.99) CT head STAT-no acute changes Plan of Care (if not transferred): Nothing focal on exam to suggest stroke. ?? Metabolic cause??  Pt is currently stable but needs HD. Per HD dept, pt can go in a few hours. Monitor mental status/VS closely. Call RRT if further assistance needed. Event Summary: Name of Physician Notified: Kennon Holter, NP at (PTA RRT)  Name of Consulting Physician Notified: Carolin Sicks, MD(Renal) at (PTA RRT)  Outcome: Stayed in room and stabalized  Event End Time: 2020  Dillard Essex

## 2019-08-18 NOTE — Progress Notes (Signed)
[facility] NEPHROLOGY PROGRESS NOTE  Assessment/ Plan: Pt is a 73 y.o. yo female with out of hospital cardiac arrest CPR for 10 minutes, history of CKD stage IV, CHF.  She was a started dialysis on 10/25 via right IJ catheter.  Urine culture with E. coli, CRRT initiated on 10/30/120.  #AKI on CKD stage IV: CKD due to hypertension.  AKI likely ATN in the setting of shock, cardiac arrest and bacteremia.  She was on CRRT since 10/30-11/1.   -Kidney ultrasound showed solitary right kidney with cortical thinning and cysts. -Urine output is very marginal associated with mild hyperkalemia and worsening renal failure.  Increased work of breathing concerning for fluid overload.  Plan for HD today. -Primary team is discussing goals of care with the family today.  If desired to continue dialysis then she will need tunneled catheter placement and arrangement for outpatient dialysis.  Continue to follow.  #Cardiac arrest, acute on chronic systolic CHF: EF [id number] by echo, cardiology is following.  EF improved in TEE.  #MSSA bacteremia: Continue cefazolin. End date six weeks from last negative culture 08/07/19 - 09/18/19.  #Hypotension: Currently off pressor.  Blood pressure is better.  Continue midodrine.  #Encephalopathy: Alert awake this morning.  No significant change.  #Acute respiratory failure with hypoxia: Mildly worsening respiratory status today.  On oxygen.  Plan for ultrafiltration during HD.  #Paroxysmal atrial fibrillation: Cardiology following.  On amiodarone and heparin drip.  Subjective: Seen and examined.  She was moved out of ICU.  Urine output only 425 cc.  Increased work of breathing and on oxygen.  Review of system limited. Objective Vital signs in last 24 hours: Vitals:   08/18/19 0015 08/18/19 0343 08/18/19 0625 08/18/19 0742  BP:   109/65 126/74  Pulse:   90   Resp: (!) 25 (!) 25  (!) 34  Temp:   98.4 F (36.9 C) 98.2 F (36.8 C)  TempSrc:   Oral Oral  SpO2:    99% 99%  Weight:   77 kg   Height:       Weight change: 1.6 kg  Intake/Output Summary (Last 24 hours) at 08/18/2019 1038 Last data filed at 08/18/2019 0800 Gross per 24 hour  Intake 743 ml  Output 425 ml  Net 318 ml       Labs: Basic Metabolic Panel: Recent Labs  Lab 08/16/19 0500 08/16/19 1630 08/17/19 0222 08/18/19 0249  NA 135  --  135 134*  K 4.1  --  4.6 5.5*  CL 99  --  98 99  CO2 26  --  24 24  GLUCOSE 122*  --  115* 119*  BUN 25*  --  47* 67*  CREATININE 2.09*  --  2.99* 3.70*  CALCIUM 8.3*  --  8.4* 8.3*  PHOS 4.8* 4.5 4.6 4.8*   Liver Function Tests: Recent Labs  Lab 08/12/19 1606 08/13/19 0531 08/15/19 1130  AST  --   --  16  ALT  --   --  <5  ALKPHOS  --   --  97  BILITOT  --   --  0.7  PROT  --   --  5.5*  ALBUMIN 1.8* 2.1* 1.9*   No results for input(s): LIPASE, AMYLASE in the last 168 hours. No results for input(s): AMMONIA in the last 168 hours. CBC: Recent Labs  Lab 08/13/19 0531  08/14/19 0458 08/15/19 0331 08/15/19 2040 08/16/19 0500 08/18/19 0249  WBC 29.0*  --  22.5* 25.7*  --  21.6* 18.7*  HGB 9.1*   < > 8.0* 7.8* 8.5* 8.2* 7.5*  HCT 29.9*   < > 25.9* 26.1* 25.0* 27.8* 24.0*  MCV 93.1  --  93.5 95.6  --  95.9 93.0  PLT 251  --  253 287  --  371 384   < > = values in this interval not displayed.   Cardiac Enzymes: No results for input(s): CKTOTAL, CKMB, CKMBINDEX, TROPONINI in the last 168 hours. CBG: Recent Labs  Lab 08/17/19 2052 08/18/19 0008 08/18/19 0015 08/18/19 0332 08/18/19 0745  GLUCAP 120* 135* 127* 134* 124*    Iron Studies: No results for input(s): IRON, TIBC, TRANSFERRIN, FERRITIN in the last 72 hours. Studies/Results: No results found.  Medications: Infusions: . sodium chloride Stopped (08/15/19 1325)  . sodium chloride    . sodium chloride    .  ceFAZolin (ANCEF) IV 2 g (08/17/19 2201)  . feeding supplement (OSMOLITE 1.5 CAL) 1,000 mL (08/17/19 2146)  . heparin 1,150 Units/hr (08/17/19 2100)   . valproate sodium 250 mg (08/18/19 0548)    Scheduled Medications: . amiodarone  400 mg Per Tube Daily  . ARIPiprazole  10 mg Per Tube BID  . atorvastatin  20 mg Per NG tube Daily  . chlorhexidine  15 mL Mouth Rinse BID  . Chlorhexidine Gluconate Cloth  6 each Topical Q0600  . Chlorhexidine Gluconate Cloth  6 each Topical Q0600  . feeding supplement (PRO-STAT SUGAR FREE 64)  30 mL Per Tube Daily  . insulin aspart  0-9 Units Subcutaneous Q4H  . mouth rinse  15 mL Mouth Rinse BID  . midodrine  10 mg Per Tube TID WC  . pantoprazole (PROTONIX) IV  40 mg Intravenous Q24H  . sodium chloride flush  10-40 mL Intracatheter Q12H  . Thrombi-Pad  1 each Topical Once    have reviewed scheduled and prn medications.  Physical Exam: General: Alert awake, mild increased work of breathing Heart:RRR, s1s2 nl Lungs: Bibasal crackles, no wheezing,  Abdomen:soft, Non-tender, non-distended Extremities: Trace edema Dialysis Access: Right IJ temporary catheter, site clean  Sabri Teal Prasad Rhandi Despain 08/18/2019,10:38 AM  LOS: 15 days  Pager: [phone]

## 2019-08-18 NOTE — Care Management Important Message (Signed)
Important Message  Patient Details  Name: Jaclyn Day MRN: [id number] Date of Birth: 1945-10-29   Medicare Important Message Given:  Yes     Shelda Altes 08/18/2019, 2:56 PM

## 2019-08-18 NOTE — Progress Notes (Signed)
PROGRESS NOTE    Jaclyn Day  [id number] DOB: [birth year] DOA: 08/03/2019 PCP: Cyndi Bender, PA-C    Brief Narrative:  Patient is 73 year old with history of congenital unilateral kidney, stage IV chronic kidney disease with baseline creatinine about 2.2-2.8, hypertension, persistent A. fib on anticoagulation with Eliquis, chronic diastolic heart failure, history of cardioversion for A. fib admitted to ICU with cardiac arrest.  Patient from home. 10/22 Admitted post arrest, intubated, initiation of targeted temperature management 36 celsius.   10/23 + 800 ml I/O, TTM, Weaned off Levophed overnight, ECHO: LVEF: 20-25%, global hypokinesis, RV midly reduced function with normal size, mild MR, normal pulmonary pressures.  Lactate cleared 10/24 - 40% fio2, Oliguric v Anuric. On amio gtt, heparin gtt, neo gtt . Being weaned off neo gtt. Rewarmed to 37. Non purposeful only (not on sedation). Growing e. colii in urine from admission. 10/25 - staph identified in blood and vanc started. Still with poor Ur OP. On amio gtt. Off leveophed an dneo. On Heparin gtt.  iHD w/ 1L off 10/26-  following simple commands, placed on precedex for agitation, febrile- re-cultured and femoral CVL discontinued  10/28- converted to NSR, TEE  10/30 - CRRT for volume removal Blood culture 10/22 >> MSSA  1/4 COVID 10/22 >> Negative RVP 10/22 >> Negative Urine 10/22 - E colii 10/26 BCx 2 >> neg  Assessment & Plan:   Principal Problem:   MSSA bacteremia Active Problems:   Cardiac arrest ([place])   Pressure injury of skin   Acute respiratory failure with hypoxia (HCC)   AKI (acute kidney injury) ([place])   Elevated troponin   Persistent atrial fibrillation (HCC)   Acute on chronic combined systolic and diastolic CHF (congestive heart failure) (HCC)   Acute bacterial endocarditis   Malnutrition of moderate degree  Acute hypoxic respiratory failure due to cardiac arrest and pulmonary edema: Extubated to nasal  cannula oxygen.  Keep on oxygen to keep saturation more than 90%.  Chest physiotherapy and incentive spirometry as patient able to tolerate. She is not much participating.  Witnessed cardiac arrest in the setting of history of chronic atrial fibrillation and coronary artery disease: Patient treated with hypothermia protocol.  Followed by cardiology.  Developed intermittent rapid A. fib.  Blood pressures low to tolerate multiple medications.  Initially treated with Cardizem drip, needed Levophed for hypotension and now weaned off. Currently on amiodarone 400 mg daily with prolonged taper and maintenance planned. Remains on heparin infusion, continue heparin infusion until catheter plan finalized.  Switch to Eliquis after final plans.  Acute metabolic encephalopathy: In the setting of cardiac arrest hypothermia and prolonged hospitalization.  CT head 08/16/2019 stable. Remains waxing and waning mental status.  Acute on chronic systolic heart failure: TTE on 10/22 with EF 20%.  Repeat TEE with improved ejection fraction to 60%.  CAD evaluation pending renal improvement.  MSSA bacteremia with suspected aortic valve vegetation: Repeat cultures negative.  Seen by infectious disease.  Recommended cefazolin until 12/7.  AKI on CKD stage IV: Suspected AKI likely ATN in the setting of shock ,cardiac arrest and bacteremia.  Started on CRRT.  Kidney ultrasound with no obstruction. Urine output 450 cc last 24 hours.  With more lethargic and shortness of breath getting CRRT today. Followed by nephrology.  Diet: Currently on tube feeding via core track tube.  Continue.  Speech to see as patient is more awake today.   DVT prophylaxis: Heparin infusion Code Status: Full code Family Communication: Patient's significant other on the  phone. Disposition Plan: Continue hospitalization pending improvement.  May need a skilled nursing facility on discharge.   Consultants:   Nephrology  Cardiology  PCCM   Procedures:   TEE  CRRT  Antimicrobials:  Rocephin 10/22 >> 10/25 Vanc (staph in blood ) 10/24  Cefazolin 10/25 >>   Subjective:  Patient seen and examined.  No overnight events.  She was more lethargic today, was unable to talk.  Afebrile.   Objective: Vitals:   08/18/19 0343 08/18/19 0625 08/18/19 0742 08/18/19 1000  BP:  109/65 126/74   Pulse:  90    Resp: (!) 25  (!) 34 17  Temp:  98.4 F (36.9 C) 98.2 F (36.8 C)   TempSrc:  Oral Oral   SpO2:  99% 99%   Weight:  77 kg    Height:        Intake/Output Summary (Last 24 hours) at 08/18/2019 1357 Last data filed at 08/18/2019 0800 Gross per 24 hour  Intake 743 ml  Output 425 ml  Net 318 ml   Filed Weights   08/16/19 0500 08/17/19 0353 08/18/19 0625  Weight: 73.9 kg 75.4 kg 77 kg    Examination:  General exam: Appears sick, on 3 L oxygen.   Respiratory system: She does have bilateral expiratory crackles and conducted airway sounds.  No wheezing. rdiovascular system: S1 & S2 heard, irregularly irregular.  No JVD, murmurs, rubs, gallops or clicks. No pedal edema. Gastrointestinal system: Abdomen is nondistended, soft and nontender. No organomegaly or masses felt. Normal bowel sounds heard. Central nervous system: Sleepy and lethargic.  Follows very simple commands.  Unable to talk.  Generalized weakness of all extremities. Extremities: Symmetric but weak. Skin: No rashes, lesions or ulcers Psychiatry: Judgement and insight appear normal. Mood & affect anxious and flat.     Data Reviewed: I have personally reviewed following labs and imaging studies  CBC: Recent Labs  Lab 08/13/19 0531  08/14/19 0458 08/15/19 0331 08/15/19 2040 08/16/19 0500 08/18/19 0249  WBC 29.0*  --  22.5* 25.7*  --  21.6* 18.7*  HGB 9.1*   < > 8.0* 7.8* 8.5* 8.2* 7.5*  HCT 29.9*   < > 25.9* 26.1* 25.0* 27.8* 24.0*  MCV 93.1  --  93.5 95.6  --  95.9 93.0  PLT 251  --  253 287  --  371 384   < > = values in this interval not  displayed.   Basic Metabolic Panel: Recent Labs  Lab 08/14/19 0458  08/15/19 0331 08/15/19 1713 08/15/19 2040 08/16/19 0500 08/16/19 1630 08/17/19 0222 08/18/19 0249  NA 136  --  139  --  134* 135  --  135 134*  K 4.6  --  4.7  --  3.8 4.1  --  4.6 5.5*  CL 101  --  104  --   --  99  --  98 99  CO2 24  --  24  --   --  26  --  24 24  GLUCOSE 116*  --  102*  --   --  122*  --  115* 119*  BUN 21  --  33*  --   --  25*  --  47* 67*  CREATININE 1.84*  --  2.72*  --   --  2.09*  --  2.99* 3.70*  CALCIUM 8.7*  --  8.8*  --   --  8.3*  --  8.4* 8.3*  MG  --   --  2.5*  1.9  --  2.2 2.1 2.2 2.2  PHOS  --    < > 5.2* 3.0  --  4.8* 4.5 4.6 4.8*   < > = values in this interval not displayed.   GFR: Estimated Creatinine Clearance: 14.4 mL/min (A) (by C-G formula based on SCr of 3.7 mg/dL (H)). Liver Function Tests: Recent Labs  Lab 08/11/19 1627 08/12/19 0305 08/12/19 1606 08/13/19 0531 08/15/19 1130  AST  --   --   --   --  16  ALT  --   --   --   --  <5  ALKPHOS  --   --   --   --  97  BILITOT  --   --   --   --  0.7  PROT  --   --   --   --  5.5*  ALBUMIN 1.9* 1.9* 1.8* 2.1* 1.9*   No results for input(s): LIPASE, AMYLASE in the last 168 hours. No results for input(s): AMMONIA in the last 168 hours. Coagulation Profile: No results for input(s): INR, PROTIME in the last 168 hours. Cardiac Enzymes: No results for input(s): CKTOTAL, CKMB, CKMBINDEX, TROPONINI in the last 168 hours. BNP (last 3 results) No results for input(s): PROBNP in the last 8760 hours. HbA1C: No results for input(s): HGBA1C in the last 72 hours. CBG: Recent Labs  Lab 08/18/19 0008 08/18/19 0015 08/18/19 0332 08/18/19 0745 08/18/19 1129  GLUCAP 135* 127* 134* 124* 145*   Lipid Profile: No results for input(s): CHOL, HDL, LDLCALC, TRIG, CHOLHDL, LDLDIRECT in the last 72 hours. Thyroid Function Tests: No results for input(s): TSH, T4TOTAL, FREET4, T3FREE, THYROIDAB in the last 72 hours. Anemia  Panel: No results for input(s): VITAMINB12, FOLATE, FERRITIN, TIBC, IRON, RETICCTPCT in the last 72 hours. Sepsis Labs: No results for input(s): PROCALCITON, LATICACIDVEN in the last 168 hours.  Recent Results (from the past 240 hour(s))  Culture, blood (routine x 2)     Status: None   Collection Time: 08/10/19 10:16 AM   Specimen: BLOOD RIGHT HAND  Result Value Ref Range Status   Specimen Description BLOOD RIGHT HAND  Final   Special Requests AEROBIC BOTTLE ONLY Blood Culture adequate volume  Final   Culture   Final    NO GROWTH 5 DAYS Performed at [facility], [address] [place], [place] [address]    Report Status 08/15/2019 FINAL  Final  Culture, blood (routine x 2)     Status: None   Collection Time: 08/10/19 10:17 AM   Specimen: BLOOD RIGHT HAND  Result Value Ref Range Status   Specimen Description BLOOD RIGHT HAND  Final   Special Requests AEROBIC BOTTLE ONLY Blood Culture adequate volume  Final   Culture   Final    NO GROWTH 5 DAYS Performed at [facility], [place] [address] [place],  [address]    Report Status 08/15/2019 FINAL  Final         Radiology Studies: No results found.      Scheduled Meds: . amiodarone  400 mg Per Tube Daily  . ARIPiprazole  10 mg Per Tube BID  . atorvastatin  20 mg Per NG tube Daily  . chlorhexidine  15 mL Mouth Rinse BID  . Chlorhexidine Gluconate Cloth  6 each Topical Q0600  . Chlorhexidine Gluconate Cloth  6 each Topical Q0600  . feeding supplement (PRO-STAT SUGAR FREE 64)  30 mL Per Tube Daily  . insulin aspart  0-9  Units Subcutaneous Q4H  . mouth rinse  15 mL Mouth Rinse BID  . midodrine  10 mg Per Tube TID WC  . pantoprazole (PROTONIX) IV  40 mg Intravenous Q24H  . sodium chloride flush  10-40 mL Intracatheter Q12H  . Thrombi-Pad  1 each Topical Once   Continuous Infusions: . sodium chloride Stopped (08/15/19 1325)  . sodium chloride    . sodium chloride    .  ceFAZolin (ANCEF) IV 2 g  (08/17/19 2201)  . feeding supplement (OSMOLITE 1.5 CAL) 1,000 mL (08/17/19 2146)  . heparin 1,150 Units/hr (08/17/19 2100)  . valproate sodium 250 mg (08/18/19 1249)     LOS: 15 days    Time spent: 30 minutes    Barb Merino, MD [facility] Pager [phone]

## 2019-08-18 NOTE — Progress Notes (Signed)
ABG results given to Anselm Pancoast, RN (Rapid Response)

## 2019-08-18 NOTE — Progress Notes (Signed)
[place] for Heparin  Indication: atrial fibrillation  Allergies  Allergen Reactions  . Codeine Other (See Comments)    Reaction not recalled by family- was told to never take this    Patient Measurements: Height: 5\' 6"  (167.6 cm) Weight: 169 lb 12.1 oz (77 kg) IBW/kg (Calculated) : 59.3 Heparin Dosing Weight: 78 kg  Vital Signs: Temp: 98.2 F (36.8 C) (11/06 0742) Temp Source: Oral (11/06 0742) BP: 126/74 (11/06 0742) Pulse Rate: 90 (11/06 0625)  Labs: Recent Labs    08/15/19 2040 08/16/19 0500 08/17/19 0222 08/18/19 0249  HGB 8.5* 8.2*  --  7.5*  HCT 25.0* 27.8*  --  24.0*  PLT  --  371  --  384  HEPARINUNFRC  --  0.73* 0.59 0.65  CREATININE  --  2.09* 2.99* 3.70*    Estimated Creatinine Clearance: 14.4 mL/min (A) (by C-G formula based on SCr of 3.7 mg/dL (H)).  Assessment: Pt is a 73 y/o female admitted s/p out of hospital cardiac arrest. ROSC was achieved and targeted temperature management was initiated. Pt has a history of A-fib (taking eliquis PTA), diastolic heart failure, HTN, HDL, IDDM, CKD.  -heparin level at goal. Plans noted for possible HD cath placement  Goal of Therapy:  Heparin level 0.3-0.7 units/ml Monitor platelets by anticoagulation protocol: Yes   Plan:  Continue heparin at 1150 units/hr Daily heparin level and CBC.  Hildred Laser, PharmD Clinical Pharmacist **Pharmacist phone directory can now be found on [email or web address] ([email or web address]).  Listed under [place].

## 2019-08-18 NOTE — Progress Notes (Signed)
Progress Note  Patient Name: Jaclyn Day Date of Encounter: 08/18/2019 Primary Cardiologist: Ena Dawley, MD   Subjective   O/N events: Transferred out of ICU. Jaclyn Day seen and evaluated at bedside, she was awake and opening her eyes however did not respond verbally.  Only wiggled her toes to command.  Inpatient Medications    Scheduled Meds: . amiodarone  400 mg Per Tube Daily  . ARIPiprazole  10 mg Per Tube BID  . atorvastatin  20 mg Per NG tube Daily  . chlorhexidine  15 mL Mouth Rinse BID  . Chlorhexidine Gluconate Cloth  6 each Topical Q0600  . Chlorhexidine Gluconate Cloth  6 each Topical Q0600  . feeding supplement (PRO-STAT SUGAR FREE 64)  30 mL Per Tube Daily  . insulin aspart  0-9 Units Subcutaneous Q4H  . mouth rinse  15 mL Mouth Rinse BID  . midodrine  10 mg Per Tube TID WC  . pantoprazole (PROTONIX) IV  40 mg Intravenous Q24H  . sodium chloride flush  10-40 mL Intracatheter Q12H  . Thrombi-Pad  1 each Topical Once   Continuous Infusions: . sodium chloride Stopped (08/15/19 1325)  . sodium chloride    . sodium chloride    .  ceFAZolin (ANCEF) IV 2 g (08/17/19 2201)  . feeding supplement (OSMOLITE 1.5 CAL) 1,000 mL (08/17/19 2146)  . heparin 1,150 Units/hr (08/17/19 2100)  . valproate sodium 250 mg (08/18/19 0548)   PRN Meds: sodium chloride, sodium chloride, acetaminophen (TYLENOL) oral liquid 160 mg/5 mL, alteplase, fentaNYL (SUBLIMAZE) injection, heparin, hydrALAZINE, lidocaine (PF), lidocaine-prilocaine, pentafluoroprop-tetrafluoroeth, sodium chloride flush   Vital Signs    Vitals:   08/18/19 0015 08/18/19 0343 08/18/19 0625 08/18/19 0742  BP:   109/65 126/74  Pulse:   90   Resp: (!) 25 (!) 25  (!) 34  Temp:   98.4 F (36.9 C) 98.2 F (36.8 C)  TempSrc:   Oral Oral  SpO2:   99% 99%  Weight:   77 kg   Height:        Intake/Output Summary (Last 24 hours) at 08/18/2019 0851 Last data filed at 08/18/2019 0800 Gross per 24 hour   Intake 765.92 ml  Output 425 ml  Net 340.92 ml   Filed Weights   08/16/19 0500 08/17/19 0353 08/18/19 0625  Weight: 73.9 kg 75.4 kg 77 kg    Telemetry    Atrial fibrillation, occasional PVCs, 3 second pause- Personally Reviewed  ECG    None today   Physical Exam   General: Awake, fatigued and no distress HEENT: sclera clear, anicteric Heart: S1, S2 normal, no murmur, rub or gallop, irregular rate and rhythm Lungs: Bilateral rhonchi, mildly increased work of breathing Abdomen: abdomen is soft without significant tenderness, masses, organomegaly or guarding Extremities: extremities normal, atraumatic, no cyanosis or edema Neurology: Awake, not oriented, no verbal response, wiggles toes when asked but not following any other commands Psychiatry: Normal mood and affect  Labs    Chemistry Recent Labs  Lab 08/12/19 1606 08/13/19 0531  08/15/19 1130  08/16/19 0500 08/17/19 0222 08/18/19 0249  NA 137 137  136   < >  --    < > 135 135 134*  K 4.4 4.3  4.3   < >  --    < > 4.1 4.6 5.5*  CL 101 100  100   < >  --   --  99 98 99  CO2 27 27  26    < >  --   --  26 24 24   GLUCOSE 131* 138*  137*   < >  --   --  122* 115* 119*  BUN 15 11  10    < >  --   --  25* 47* 67*  CREATININE 1.15* 0.79  0.90   < >  --   --  2.09* 2.99* 3.70*  CALCIUM 8.1* 8.6*  8.7*   < >  --   --  8.3* 8.4* 8.3*  PROT  --   --   --  5.5*  --   --   --   --   ALBUMIN 1.8* 2.1*  --  1.9*  --   --   --   --   AST  --   --   --  16  --   --   --   --   ALT  --   --   --  <5  --   --   --   --   ALKPHOS  --   --   --  97  --   --   --   --   BILITOT  --   --   --  0.7  --   --   --   --   GFRNONAA 47* >60  >60   < >  --   --  23* 15* 12*  GFRAA 55* >60  >60   < >  --   --  27* 17* 13*  ANIONGAP 9 10  10    < >  --   --  10 13 11    < > = values in this interval not displayed.     Hematology Recent Labs  Lab 08/15/19 0331 08/15/19 2040 08/16/19 0500 08/18/19 0249  WBC 25.7*  --  21.6*  18.7*  RBC 2.73*  --  2.90* 2.58*  HGB 7.8* 8.5* 8.2* 7.5*  HCT 26.1* 25.0* 27.8* 24.0*  MCV 95.6  --  95.9 93.0  MCH 28.6  --  28.3 29.1  MCHC 29.9*  --  29.5* 31.3  RDW 17.7*  --  17.5* 17.8*  PLT 287  --  371 384    Cardiac EnzymesNo results for input(s): TROPONINI in the last 168 hours. No results for input(s): TROPIPOC in the last 168 hours.   BNPNo results for input(s): BNP, PROBNP in the last 168 hours.   DDimer No results for input(s): DDIMER in the last 168 hours.   Radiology    No results found.  Cardiac Studies   TTE 08/03/19 1. Left ventricular ejection fraction, by visual estimation, is 20 to 25%. The left ventricle has severely decreased function. Normal left ventricular size. There is moderately increased left ventricular hypertrophy. Severe global hypokinesis. 2. Left ventricular diastolic function could not be evaluated pattern of LV diastolic filling. 3. Global right ventricle has mildly reduced systolic function.The right ventricular size is normal. No increase in right ventricular wall thickness. 4. Left atrial size was severely dilated. 5. Right atrial size was normal. 6. The mitral valve is abnormal. Mild mitral valve regurgitation. 7. The tricuspid valve is grossly normal. Tricuspid valve regurgitation is trivial. 8. The aortic valve is tricuspid Aortic valve regurgitation was not visualized by color flow Doppler. 9. The pulmonic valve was grossly normal. Pulmonic valve regurgitation is not visualized by color flow Doppler. 10. Normal pulmonary artery systolic pressure. 11. The inferior vena cava is normal in size with <50% respiratory variability, suggesting right atrial pressure of 8  mmHg. (on vent) 12. The interatrial septum was not well visualized.  TEE 08/09/19 IMPRESSION:   1. Small filamentous mass on the aortic side of the left coronary cusp, most consistent with vegetation given the clinical picture of staph bacteremia. 2. No LAA  thrombus 3. Negative for PFO by color doppler 4. LVEF improved to 55-60% - possible mild anterior hypokinesis.   Patient Profile     73 y.o. female with a history of HFrEF, HTN, paroxysmal A fib, IDDM, CKD stage 4 s/p nephrectomy who presented after a out of hospital arrest, found to have staph bacteremia/endocarditis, respiratory failure, renal failure, and atrial fibrillation.   Assessment & Plan    1. Paroxysmal atrial fibrillation: Currently rate controlled in atrial fibrillation, on increased dose of amiodarone but given the prior hypotension will need to monitor closely.  Currently on Eliquis.  .  -Continue amiodarone 400 mg daily -Continue eliquis  2. Acute respiratory failure: S/p extubation 07/14/19. Currently on 2L [place]. Oxygenating well. Wean as tolerated.  3. Acute encephalopathy:  CT head showed no acute intracranial abnormalities. ABG was unremarkable. TSH 3.5. Valproic acid level 29.  Mental status slightly decreased today compared to yesterday, only moving her toes to command and not responding verbally 4. Acute on chronic systolic heart failure: TTE on 10/22 showed decreased EF of 20-25%. TEE on 10/28 showed EF improved to 55-60% with anterior hypokinesis.  -Will need to have further evaluation for CAD however will hold off in setting of AKI.  5. SBE: Currently on a prolonged course of antibiotics, cefazolin. On review of TEE it's unclear if there is a vegetation vs a lambls excrescences. End date of antibiotics 6 weeks from last negative culture.  -ID following, appreciate recommendations -Continue antibiotics per ID 6. AoCKD: Remain oliguric, currently on iHD. Cr today 3.7 from 2.9. Nephro following.   For questions or updates, please contact [facility] Please consult [email or web address] for contact info under Cardiology/STEMI.   Signed, Asencion Noble, MD  08/18/2019, 8:51 AM   I have seen and examined the patient along with Asencion Noble, MD .  I have reviewed  the chart, notes and new data.  I agree with her note.  Key new complaints: The status continues to wax and wane.  She is definitely less interactive today.  Opens eyes to her name, but does not follow commands or speak. Key examination changes: Appears clinically euvolemic.  Irregular rhythm with adequate rate control. Key new findings / data: Atrial fibrillation controlled ventricular response on telemetry.  Creatinine increasing in the absence of hemodialysis.  PLAN: [facility] will sign off.   Medication Recommendations: Continue amiodarone 400 mg daily for a week, then 200 mg daily indefinitely. Once no invasive procedures are planned, switch from intravenous heparin to apixaban 5 mg twice daily Other recommendations (labs, testing, etc): If she remains on amiodarone long-term will require liver function tests and thyroid function tests every 6 months Follow up as an outpatient: Please call us when the patient is ready for discharge so we can make arrangements for follow-up   Sanda Klein, MD, [place] [phone] 08/18/2019, 10:10 AM

## 2019-08-18 NOTE — Progress Notes (Signed)
Called to the bedside by RN with concerns for AMS. Review of notes show previous diagnosis of encephalopathy that waxes and wanes. The bedside RN and patient's sister state that is worse now than she has been in the past. On assessment, pt responds to physical stimuli. She is lethargic and can not follow commands. VSS are stable.   Encephalopathy - CBC and CMP ordered -ABG ordered - CT of head ordered - Pt is in need of dialysis. Has not had her scheduled dialysis and this is probably contributing to current encephalopathy. She has been scheduled for later tonight. Will continue to monitor.   Lovey Newcomer, NP [facility] 7p-7a [phone]

## 2019-08-19 ENCOUNTER — Inpatient Hospital Stay (HOSPITAL_COMMUNITY): Payer: 59

## 2019-08-19 DIAGNOSIS — S2249XA Multiple fractures of ribs, unspecified side, initial encounter for closed fracture: Secondary | ICD-10-CM | POA: Diagnosis not present

## 2019-08-19 DIAGNOSIS — Z7189 Other specified counseling: Secondary | ICD-10-CM

## 2019-08-19 DIAGNOSIS — Z515 Encounter for palliative care: Secondary | ICD-10-CM | POA: Diagnosis not present

## 2019-08-19 DIAGNOSIS — I469 Cardiac arrest, cause unspecified: Secondary | ICD-10-CM | POA: Diagnosis not present

## 2019-08-19 DIAGNOSIS — I639 Cerebral infarction, unspecified: Secondary | ICD-10-CM

## 2019-08-19 DIAGNOSIS — B9561 Methicillin susceptible Staphylococcus aureus infection as the cause of diseases classified elsewhere: Secondary | ICD-10-CM | POA: Diagnosis not present

## 2019-08-19 DIAGNOSIS — I4819 Other persistent atrial fibrillation: Secondary | ICD-10-CM | POA: Diagnosis not present

## 2019-08-19 DIAGNOSIS — R7881 Bacteremia: Secondary | ICD-10-CM | POA: Diagnosis not present

## 2019-08-19 LAB — BASIC METABOLIC PANEL
Anion gap: 12 (ref 5–15)
BUN: 66 mg/dL — ABNORMAL HIGH (ref 8–23)
CO2: 25 mmol/L (ref 22–32)
Calcium: 8.6 mg/dL — ABNORMAL LOW (ref 8.9–10.3)
Chloride: 97 mmol/L — ABNORMAL LOW (ref 98–111)
Creatinine, Ser: 3.46 mg/dL — ABNORMAL HIGH (ref 0.44–1.00)
GFR calc Af Amer: 15 mL/min — ABNORMAL LOW (ref >60)
GFR calc non Af Amer: 13 mL/min — ABNORMAL LOW (ref >60)
Glucose, Bld: 100 mg/dL — ABNORMAL HIGH (ref 70–99)
Potassium: 5.4 mmol/L — ABNORMAL HIGH (ref 3.5–5.1)
Sodium: 134 mmol/L — ABNORMAL LOW (ref 135–145)

## 2019-08-19 LAB — CBC
HCT: 25.9 % — ABNORMAL LOW (ref 36.0–46.0)
Hemoglobin: 7.7 g/dL — ABNORMAL LOW (ref 12.0–15.0)
MCH: 28.2 pg (ref 26.0–34.0)
MCHC: 29.7 g/dL — ABNORMAL LOW (ref 30.0–36.0)
MCV: 94.9 fL (ref 80.0–100.0)
Platelets: 409 10*3/uL — ABNORMAL HIGH (ref 150–400)
RBC: 2.73 MIL/uL — ABNORMAL LOW (ref 3.87–5.11)
RDW: 17.8 % — ABNORMAL HIGH (ref 11.5–15.5)
WBC: 17.6 10*3/uL — ABNORMAL HIGH (ref 4.0–10.5)
nRBC: 0 % (ref 0.0–0.2)

## 2019-08-19 LAB — GLUCOSE, CAPILLARY
Glucose-Capillary: 95 mg/dL (ref 70–99)
Glucose-Capillary: 126 mg/dL — ABNORMAL HIGH (ref 70–99)
Glucose-Capillary: 122 mg/dL — ABNORMAL HIGH (ref 70–99)
Glucose-Capillary: 135 mg/dL — ABNORMAL HIGH (ref 70–99)
Glucose-Capillary: 125 mg/dL — ABNORMAL HIGH (ref 70–99)

## 2019-08-19 LAB — HEPARIN LEVEL (UNFRACTIONATED)
Heparin Unfractionated: 1.28 IU/mL — ABNORMAL HIGH (ref 0.30–0.70)
Heparin Unfractionated: 0.62 IU/mL (ref 0.30–0.70)
Heparin Unfractionated: 0.63 IU/mL (ref 0.30–0.70)

## 2019-08-19 LAB — PHOSPHORUS: Phosphorus: 5.2 mg/dL — ABNORMAL HIGH (ref 2.5–4.6)

## 2019-08-19 MED ORDER — STROKE: EARLY STAGES OF RECOVERY BOOK
Freq: Once | Status: DC
  Filled 2019-08-19: qty 1

## 2019-08-19 MED ORDER — HEPARIN SODIUM (PORCINE) 1000 UNIT/ML IJ SOLN
2.4000 mL | Freq: Once | INTRAMUSCULAR | Status: AC
  Administered 2019-08-19: 2400 [IU] via INTRAVENOUS

## 2019-08-19 MED ORDER — HYDROMORPHONE HCL 1 MG/ML IJ SOLN
0.2500 mg | INTRAMUSCULAR | Status: DC | PRN
  Administered 2019-08-19 – 2019-08-22 (×6): 0.25 mg via INTRAVENOUS
  Filled 2019-08-19 (×7): qty 1

## 2019-08-19 MED ORDER — CHLORHEXIDINE GLUCONATE CLOTH 2 % EX PADS
6.0000 | MEDICATED_PAD | Freq: Every day | CUTANEOUS | Status: DC
  Administered 2019-08-21: 06:00:00 6 via TOPICAL

## 2019-08-19 MED ORDER — HEPARIN SODIUM (PORCINE) 1000 UNIT/ML IJ SOLN
INTRAMUSCULAR | Status: AC
  Filled 2019-08-19: qty 3

## 2019-08-19 NOTE — Progress Notes (Signed)
Stroke swallow screen order; however, pt NPO prior to this order d/t NG tube. Charted in flowsheet.

## 2019-08-19 NOTE — Progress Notes (Signed)
[place] for Heparin  Indication: atrial fibrillation  Allergies  Allergen Reactions  . Codeine Other (See Comments)    Reaction not recalled by family- was told to never take this    Patient Measurements: Height: 5\' 6"  (167.6 cm) Weight: 170 lb 3.1 oz (77.2 kg) IBW/kg (Calculated) : 59.3 Heparin Dosing Weight: 78 kg  Vital Signs: Temp: 98.2 F (36.8 C) (11/07 1655) Temp Source: Oral (11/07 1655) BP: 119/64 (11/07 1655) Pulse Rate: 76 (11/07 1725)  Labs: Recent Labs    08/18/19 0249 08/18/19 1931 08/19/19 0420 08/19/19 1214 08/19/19 2057  HGB 7.5* 7.5* 7.7*  --   --   HCT 24.0* 24.3* 25.9*  --   --   PLT 384 393 409*  --   --   HEPARINUNFRC 0.65  --  1.28* 0.62 0.63  CREATININE 3.70* 3.99* 3.46*  --   --     Estimated Creatinine Clearance: 15.4 mL/min (A) (by C-G formula based on SCr of 3.46 mg/dL (H)).  Assessment: Pt is a 73 y/o female admitted s/p out of hospital cardiac arrest. ROSC was achieved and targeted temperature management was initiated. This admission has been complicated by continued encephalopathy, A on CKD which has advanced to dialysis, and hypotension.  MRI today showing several small acute infarcts.  No hemorrhage.  Pt has a history of A-fib (taking eliquis PTA), diastolic heart failure, HTN, HDL, IDDM, CKD. Pt continues on heparin for Afib.   Given new information regarding acute infarcts, will decrease heparin level rate and goal.  Goal of Therapy:  Heparin level 0.3-0.5 units/ml Monitor platelets by anticoagulation protocol: Yes   Plan:  Reduce heparin to 1000 units/hr Next heparin level with AM labs.  Monitor heparin level, CBC, and S/S of bleeding daily   [employer], Pharm.D., BCPS Clinical Pharmacist  **Pharmacist phone directory can now be found on [email or web address] ([email or web address]).  Listed under [place].  08/19/2019 10:23 PM

## 2019-08-19 NOTE — Progress Notes (Signed)
EEG complete - results pending 

## 2019-08-19 NOTE — Progress Notes (Signed)
PROGRESS NOTE    FEBE Day  [id number] DOB: 04-01-1946 DOA: 08/03/2019 PCP: Cyndi Bender, PA-C    Brief Narrative:  Patient is 73 year old with history of congenital unilateral kidney, stage IV chronic kidney disease with baseline creatinine about 2.2-2.8, hypertension, persistent A. fib on anticoagulation with Eliquis, chronic diastolic heart failure, history of cardioversion for A. fib admitted to ICU after witnessed cardiac arrest.  Patient from home. 10/22 Admitted post arrest, intubated, initiation of targeted temperature management 36 celsius.   ECHO: LVEF: 20-25%, global hypokinesis, RV midly reduced function with normal size, mild MR, normal pulmonary pressures.  Lactate cleared 10/25 - staph identified in blood and vanc started. Still with poor Ur OP. On amio gtt. Off leveophed and neo On Heparin gtt.  iHD w/ 1L off 10/26-  following simple commands, placed on precedex for agitation, febrile- re-cultured and femoral CVL discontinued  10/28- converted to NSR, TEE  10/30 - CRRT for volume removal Blood culture 10/22 >> MSSA  1/4 COVID 10/22 >> Negative RVP 10/22 >> Negative Urine 10/22 - E colii 10/26 BCx 2 >> neg  Remains in very poor mentation.  Assessment & Plan:   Principal Problem:   MSSA bacteremia Active Problems:   Cardiac arrest ([place])   Pressure injury of skin   Acute respiratory failure with hypoxia (HCC)   AKI (acute kidney injury) (HCC)   Elevated troponin   Persistent atrial fibrillation (HCC)   Acute on chronic combined systolic and diastolic CHF (congestive heart failure) (HCC)   Acute bacterial endocarditis   Malnutrition of moderate degree  Acute hypoxic respiratory failure due to cardiac arrest and pulmonary edema: Extubated to nasal cannula oxygen.  Keep on oxygen to keep saturation more than 90%.  Chest physiotherapy and incentive spirometry if patient can tolerate. She is not much participating.  Witnessed cardiac arrest in the  setting of history of chronic atrial fibrillation and coronary artery disease: Patient treated with hypothermia protocol.  Followed by cardiology.  Developed intermittent rapid A. fib.  Blood pressures low to tolerate multiple medications.  Initially treated with Cardizem drip, needed Levophed for hypotension and now weaned off. Currently on amiodarone 400 mg daily with prolonged taper and maintenance planned. Remains on heparin infusion, continue heparin infusion until catheter plan finalized.  Switch to Eliquis after final plans. Patient is a still in A. fib but rate controlled.  Acute metabolic encephalopathy: In the setting of cardiac arrest hypothermia and prolonged hospitalization.  CT head 08/16/2019 stable. Remains waxing and waning mental status. Patient had worsening mental status since last night.  Rapid response was called. ABG was stable.  CT head stable.  Patient is poorly responding.  Not on any sedation.  On valproic acid as longstanding medications. Probably due to metabolic cause, severe debility. We will check EEG, if abnormal, may need to rule out subclinical seizure, will discuss with neurology. Also consulted palliative care.  Acute on chronic systolic heart failure: TTE on 10/22 with EF 20%.  Repeat TEE with improved ejection fraction to 60%.  CAD evaluation pending renal improvement.  MSSA bacteremia with suspected aortic valve vegetation: Repeat cultures negative.  Seen by infectious disease.  Recommended cefazolin until 12/7.  AKI on CKD stage IV: Suspected AKI likely ATN in the setting of shock ,cardiac arrest and bacteremia.  Started on CRRT.  Now on intermittent hemodialysis.  Last dialysis 08/18/2019.  Kidney ultrasound with no obstruction.  Followed by nephrology.  Diet: Currently on tube feeding via core track tube.  Continue.  Unable to have swallow evaluation, lethargic.  DVT prophylaxis: Heparin infusion Code Status: Full code Family Communication: Significant  others. Disposition Plan: Continue hospitalization pending improvement.  May need a skilled nursing facility on discharge. Also involving palliative care.   Consultants:   Nephrology  Cardiology  PCCM   Procedures:   TEE  CRRT  Antimicrobials:  Rocephin 10/22 >> 10/25 Vanc (staph in blood ) 10/24  Cefazolin 10/25 >>   Subjective:  Patient seen and examined. Difficult to open eyes. Remains poorly participating and lethargic. Rapid response last night. Remains same as last night.  Patient may have anoxic brain injury after cardiac arrest. Discussed with neurology, will order a stat EEG and MRI.  Objective: Vitals:   08/19/19 0408 08/19/19 0638 08/19/19 0759 08/19/19 0851  BP: 128/74  128/61 (!) 120/57  Pulse: 90  84 89  Resp: 18  (!) 23 20  Temp: 98.6 F (37 C)  98.2 F (36.8 C) 98.1 F (36.7 C)  TempSrc: Axillary  Axillary   SpO2: 100%  100% 98%  Weight:  77.2 kg    Height:        Intake/Output Summary (Last 24 hours) at 08/19/2019 1035 Last data filed at 08/19/2019 [id number] Gross per 24 hour  Intake -  Output 2475 ml  Net -2475 ml   Filed Weights   08/18/19 0625 08/18/19 2330 08/19/19 0638  Weight: 77 kg 77.4 kg 77.2 kg    Examination:  General exam: Appears sick, on 3 L oxygen.  Lethargic and not well responding. She is maintaining her airway. She has exophthalmos right Respiratory system: She does have bilateral expiratory crackles and conducted airway sounds.  No wheezing. rdiovascular system: S1 & S2 heard, irregularly irregular.  No JVD, murmurs, rubs, gallops or clicks. No pedal edema. Gastrointestinal system: Abdomen is nondistended, soft and nontender. No organomegaly or masses felt. Normal bowel sounds heard. Central nervous system: Sleepy and lethargic.  Today hardly following any commands.  Unable to talk.  Generalized weakness of all extremities. Extremities: Symmetric but weak. Extremely debilitated. Skin: No rashes, lesions or ulcers  Psychiatry: Mood & affect anxious and flat.     Data Reviewed: I have personally reviewed following labs and imaging studies  CBC: Recent Labs  Lab 08/15/19 0331 08/15/19 2040 08/16/19 0500 08/18/19 0249 08/18/19 1931 08/19/19 0420  WBC 25.7*  --  21.6* 18.7* 18.2* 17.6*  HGB 7.8* 8.5* 8.2* 7.5* 7.5* 7.7*  HCT 26.1* 25.0* 27.8* 24.0* 24.3* 25.9*  MCV 95.6  --  95.9 93.0 93.1 94.9  PLT 287  --  371 384 393 [id number]   Basic Metabolic Panel: Recent Labs  Lab 08/15/19 1713  08/16/19 0500 08/16/19 1630 08/17/19 0222 08/18/19 0249 08/18/19 1931 08/19/19 0420  NA  --    < > 135  --  135 134* 133* 134*  K  --    < > 4.1  --  4.6 5.5* 5.9* 5.4*  CL  --   --  99  --  98 99 98 97*  CO2  --   --  26  --  24 24 23 25   GLUCOSE  --   --  122*  --  115* 119* 151* 100*  BUN  --   --  25*  --  47* 67* 80* 66*  CREATININE  --   --  2.09*  --  2.99* 3.70* 3.99* 3.46*  CALCIUM  --   --  8.3*  --  8.4* 8.3* 8.4* 8.6*  MG 1.9  --  2.2 2.1 2.2 2.2  --   --   PHOS 3.0  --  4.8* 4.5 4.6 4.8*  --  5.2*   < > = values in this interval not displayed.   GFR: Estimated Creatinine Clearance: 15.4 mL/min (A) (by C-G formula based on SCr of 3.46 mg/dL (H)). Liver Function Tests: Recent Labs  Lab 08/12/19 1606 08/13/19 0531 08/15/19 1130 08/18/19 1931  AST  --   --  16 10*  ALT  --   --  <5 <5  ALKPHOS  --   --  97 91  BILITOT  --   --  0.7 0.6  PROT  --   --  5.5* 5.1*  ALBUMIN 1.8* 2.1* 1.9* 1.7*   No results for input(s): LIPASE, AMYLASE in the last 168 hours. No results for input(s): AMMONIA in the last 168 hours. Coagulation Profile: No results for input(s): INR, PROTIME in the last 168 hours. Cardiac Enzymes: No results for input(s): CKTOTAL, CKMB, CKMBINDEX, TROPONINI in the last 168 hours. BNP (last 3 results) No results for input(s): PROBNP in the last 8760 hours. HbA1C: No results for input(s): HGBA1C in the last 72 hours. CBG: Recent Labs  Lab 08/18/19 1632 08/18/19 1901  08/18/19 2229 08/19/19 0413 08/19/19 0759  GLUCAP 120* 145* 98 95 126*   Lipid Profile: No results for input(s): CHOL, HDL, LDLCALC, TRIG, CHOLHDL, LDLDIRECT in the last 72 hours. Thyroid Function Tests: No results for input(s): TSH, T4TOTAL, FREET4, T3FREE, THYROIDAB in the last 72 hours. Anemia Panel: No results for input(s): VITAMINB12, FOLATE, FERRITIN, TIBC, IRON, RETICCTPCT in the last 72 hours. Sepsis Labs: No results for input(s): PROCALCITON, LATICACIDVEN in the last 168 hours.  Recent Results (from the past 240 hour(s))  Culture, blood (routine x 2)     Status: None   Collection Time: 08/10/19 10:16 AM   Specimen: BLOOD RIGHT HAND  Result Value Ref Range Status   Specimen Description BLOOD RIGHT HAND  Final   Special Requests AEROBIC BOTTLE ONLY Blood Culture adequate volume  Final   Culture   Final    NO GROWTH 5 DAYS Performed at [facility], [address], [place], [place] [address]    Report Status 08/15/2019 FINAL  Final  Culture, blood (routine x 2)     Status: None   Collection Time: 08/10/19 10:17 AM   Specimen: BLOOD RIGHT HAND  Result Value Ref Range Status   Specimen Description BLOOD RIGHT HAND  Final   Special Requests AEROBIC BOTTLE ONLY Blood Culture adequate volume  Final   Culture   Final    NO GROWTH 5 DAYS Performed at [facility], [place] [address], [place], [place] [address]    Report Status 08/15/2019 FINAL  Final         Radiology Studies: Ct Head Wo Contrast  Result Date: 08/18/2019 CLINICAL DATA:  Encephalopathy EXAM: CT HEAD WITHOUT CONTRAST TECHNIQUE: Contiguous axial images were obtained from the base of the skull through the vertex without intravenous contrast. COMPARISON:  August 15, 2019 FINDINGS: Brain: No evidence of acute infarction, hemorrhage, hydrocephalus, extra-axial collection or mass lesion/mass effect. Age related atrophy and chronic microvascular ischemic changes are again noted. Vascular: No hyperdense  vessel or unexpected calcification. Skull: Normal. Negative for fracture or focal lesion. Sinuses/Orbits: Pansinusitis is again noted. The mastoid air cells are essentially clear. Other: None. IMPRESSION: No acute abnormality.  No significant interval change. Electronically Signed   By: Harrell Gave  Green M.D.   On: 08/18/2019 20:20        Scheduled Meds: . amiodarone  400 mg Per Tube Daily  . ARIPiprazole  10 mg Per Tube BID  . atorvastatin  20 mg Per NG tube Daily  . chlorhexidine  15 mL Mouth Rinse BID  . Chlorhexidine Gluconate Cloth  6 each Topical Q0600  . Chlorhexidine Gluconate Cloth  6 each Topical Q0600  . feeding supplement (PRO-STAT SUGAR FREE 64)  30 mL Per Tube Daily  . heparin      . insulin aspart  0-9 Units Subcutaneous Q4H  . mouth rinse  15 mL Mouth Rinse BID  . midodrine  10 mg Per Tube TID WC  . pantoprazole (PROTONIX) IV  40 mg Intravenous Q24H  . sodium chloride flush  10-40 mL Intracatheter Q12H  . Thrombi-Pad  1 each Topical Once   Continuous Infusions: . sodium chloride Stopped (08/15/19 1325)  . sodium chloride    . sodium chloride    .  ceFAZolin (ANCEF) IV 2 g (08/18/19 2121)  . feeding supplement (OSMOLITE 1.5 CAL) 1,000 mL (08/19/19 0354)  . heparin 1,150 Units/hr (08/18/19 2027)  . valproate sodium 250 mg (08/19/19 0621)     LOS: 16 days    Time spent: 30 minutes    Barb Merino, MD [facility] Pager [phone]

## 2019-08-19 NOTE — Progress Notes (Deleted)
Patient was noted with more shortness of breath in the later afternoon.  Heart rate was more than 110.  She was given a dose of digoxin.  Patient was feeling short of breath on minimal movement in the bed. On examination fairly stable, transiently drop in blood pressure after digoxin is spontaneously improved. Received 40 mg of Lasix in the morning.  No obvious fluid overload except pedal edema. A chest x-ray was done and read, shows persistent right middle lobe consolidation and small effusion which is mostly chronic.  Her white blood cell count is still elevated.  Plan: Continue to monitor.  Chest physiotherapy and incentive spirometry. Had previously planned to discontinue antibiotic therapy, however given her persistent leukocytosis we will continue Unasyn when in the hospital and changed to Augmentin if she gets discharged to finish total 4 weeks of antibiotic therapy as suggested by PCCM until 09/01/2019.

## 2019-08-19 NOTE — Progress Notes (Signed)
Patient RR increasing. MEWS score transitioned from green to yellow. Pt asymptomatic and able to answer yes and no questions.  Rapid response notified about respirations and stated they would come assess patient shortly. Will continue to monitor.

## 2019-08-19 NOTE — Significant Event (Signed)
Rapid Response Event Note  Overview: Tachypnea - MEWS   Initial Focused Assessment: Nurse called with concerns of ongoing, perhaps worsening tachypnea. Upon arrival, patient was being placed on EEG, she was awake, able to follow simple commands, RUE was weaker than left, able to wiggle toes on BLE. She was able to tell her birth-date and she also endorses being scared/terrified, she appeared uncomfortable but denied having any pain. RR low 30s, shallow, overall good air movement, oxygen saturations > 94% on 2L [place], other VS were stable, no fever per nurse. Mild tremoring of LUE noted, pupils reactive and equal. Mildly anxious.   Interventions: -- None--  Plan of Care:  -- RN to page MD and update MD. I suggested a CXR perhaps.  -- Treat patient's pain if possible, she appears quite uncomfortable and her overall emotional state does not seem well, she appears mildly anxious as well.  -- Monitor VS -- Aspiration Precautions  -- Delirium Precautions   Event Summary: Call Time 1511  Arrival Time 1525  End Time 1610  Jaclyn Day R

## 2019-08-19 NOTE — Progress Notes (Signed)
[place] for Heparin  Indication: atrial fibrillation  Allergies  Allergen Reactions  . Codeine Other (See Comments)    Reaction not recalled by family- was told to never take this    Patient Measurements: Height: 5\' 6"  (167.6 cm) Weight: 170 lb 3.1 oz (77.2 kg) IBW/kg (Calculated) : 59.3 Heparin Dosing Weight: 78 kg  Vital Signs: Temp: 98.2 F (36.8 C) (11/07 1107) Temp Source: Axillary (11/07 1107) BP: 103/58 (11/07 1151) Pulse Rate: 81 (11/07 1200)  Labs: Recent Labs    08/18/19 0249 08/18/19 1931 08/19/19 0420 08/19/19 1214  HGB 7.5* 7.5* 7.7*  --   HCT 24.0* 24.3* 25.9*  --   PLT 384 393 409*  --   HEPARINUNFRC 0.65  --  1.28* 0.62  CREATININE 3.70* 3.99* 3.46*  --     Estimated Creatinine Clearance: 15.4 mL/min (A) (by C-G formula based on SCr of 3.46 mg/dL (H)).  Assessment: Pt is a 73 y/o female admitted s/p out of hospital cardiac arrest. ROSC was achieved and targeted temperature management was initiated. Pt has a history of A-fib (taking eliquis PTA), diastolic heart failure, HTN, HDL, IDDM, CKD. Pharmacy consulted to heparin for Afib.   Heparin level of 1.28 this morning was likely falsely elevated due to hemodialysis. Most recent heparin level of 0.62 is therapeutic on heparin 1150 units/hr. Hgb 7.7. Plt 409. No reported bleeding.    Goal of Therapy:  Heparin level 0.3-0.7 units/ml Monitor platelets by anticoagulation protocol: Yes   Plan:  Continue heparin at 1150 units/hr Check confirmatory heparin level at 2000 today  Monitor heparin level, CBC, and S/S of bleeding daily Nursing called to ask if heparin could be stopped during MRI, advised to not stop without discussing with physician and to let pharmacy know if heparin is stopped to retime heparin level  Cristela Felt, PharmD PGY1 Pharmacy Resident [employer]: [phone]

## 2019-08-19 NOTE — Progress Notes (Signed)
[facility] NEPHROLOGY PROGRESS NOTE  Assessment/ Plan: Pt is a 73 y.o. yo female with out of hospital cardiac arrest CPR for 10 minutes, history of CKD stage IV, CHF.  She was a started dialysis on 10/25 via right IJ catheter.  Urine culture with E. coli, CRRT initiated on 10/30/120.  #AKI on CKD stage IV: CKD due to hypertension.  AKI likely ATN in the setting of shock, cardiac arrest and bacteremia.  She was on CRRT since 10/30-11/1.   -Kidney ultrasound showed solitary right kidney with cortical thinning and cysts. -Status post HD yesterday with around 2 L UF.  Patient is still remains confused, has mild hyperkalemia and examination consistent with mild fluid overload.  Plan for another HD today.  Recommend palliative care consult.  She will need tunneled catheter placement and arrangement for outpatient dialysis.  #Acute encephalopathy: No improvement in mental status.  HD today again to bring down BUN.  Primary team is ordering MRI.  #Cardiac arrest, acute on chronic systolic CHF: EF [id number] by echo, cardiology is following.  EF improved in TEE.  #MSSA bacteremia: Continue cefazolin. End date six weeks from last negative culture 08/07/19 - 09/18/19.  #Hypotension: Currently off pressor.  Blood pressure is better.  Continue midodrine.  #Acute respiratory failure with hypoxia: HD today with UF.  On 2 L oxygen.  #Paroxysmal atrial fibrillation: Cardiology following.  On amiodarone and heparin drip.  Subjective: Seen and examined.  Overnight patient had respiratory stressed and worsening mental status.  CT head negative.  Had dialysis with 2 L UF.  Urine output only 475 cc.  No evidence of renal recovery.  Objective Vital signs in last 24 hours: Vitals:   08/19/19 0759 08/19/19 0851 08/19/19 1107 08/19/19 1151  BP: 128/61 (!) 120/57 109/71 (!) 103/58  Pulse: 84 89 75 81  Resp: (!) 23 20 (!) 24 (!) 25  Temp: 98.2 F (36.8 C) 98.1 F (36.7 C) 98.2 F (36.8 C)   TempSrc:  Axillary  Axillary   SpO2: 100% 98% 98% 100%  Weight:      Height:       Weight change: 0.4 kg  Intake/Output Summary (Last 24 hours) at 08/19/2019 1159 Last data filed at 08/19/2019 [id number] Gross per 24 hour  Intake -  Output 2475 ml  Net -2475 ml       Labs: Basic Metabolic Panel: Recent Labs  Lab 08/17/19 0222 08/18/19 0249 08/18/19 1931 08/19/19 0420  NA 135 134* 133* 134*  K 4.6 5.5* 5.9* 5.4*  CL 98 99 98 97*  CO2 24 24 23 25   GLUCOSE 115* 119* 151* 100*  BUN 47* 67* 80* 66*  CREATININE 2.99* 3.70* 3.99* 3.46*  CALCIUM 8.4* 8.3* 8.4* 8.6*  PHOS 4.6 4.8*  --  5.2*   Liver Function Tests: Recent Labs  Lab 08/13/19 0531 08/15/19 1130 08/18/19 1931  AST  --  16 10*  ALT  --  <5 <5  ALKPHOS  --  97 91  BILITOT  --  0.7 0.6  PROT  --  5.5* 5.1*  ALBUMIN 2.1* 1.9* 1.7*   No results for input(s): LIPASE, AMYLASE in the last 168 hours. No results for input(s): AMMONIA in the last 168 hours. CBC: Recent Labs  Lab 08/15/19 0331  08/16/19 0500 08/18/19 0249 08/18/19 1931 08/19/19 0420  WBC 25.7*  --  21.6* 18.7* 18.2* 17.6*  HGB 7.8*   < > 8.2* 7.5* 7.5* 7.7*  HCT 26.1*   < > 27.8*  24.0* 24.3* 25.9*  MCV 95.6  --  95.9 93.0 93.1 94.9  PLT 287  --  371 384 393 409*   < > = values in this interval not displayed.   Cardiac Enzymes: No results for input(s): CKTOTAL, CKMB, CKMBINDEX, TROPONINI in the last 168 hours. CBG: Recent Labs  Lab 08/18/19 1901 08/18/19 2229 08/19/19 0413 08/19/19 0759 08/19/19 1106  GLUCAP 145* 98 95 126* 122*    Iron Studies: No results for input(s): IRON, TIBC, TRANSFERRIN, FERRITIN in the last 72 hours. Studies/Results: Ct Head Wo Contrast  Result Date: 08/18/2019 CLINICAL DATA:  Encephalopathy EXAM: CT HEAD WITHOUT CONTRAST TECHNIQUE: Contiguous axial images were obtained from the base of the skull through the vertex without intravenous contrast. COMPARISON:  August 15, 2019 FINDINGS: Brain: No evidence of acute  infarction, hemorrhage, hydrocephalus, extra-axial collection or mass lesion/mass effect. Age related atrophy and chronic microvascular ischemic changes are again noted. Vascular: No hyperdense vessel or unexpected calcification. Skull: Normal. Negative for fracture or focal lesion. Sinuses/Orbits: Pansinusitis is again noted. The mastoid air cells are essentially clear. Other: None. IMPRESSION: No acute abnormality.  No significant interval change. Electronically Signed   By: Constance Holster M.D.   On: 08/18/2019 20:20    Medications: Infusions: . sodium chloride Stopped (08/15/19 1325)  . sodium chloride    . sodium chloride    .  ceFAZolin (ANCEF) IV 2 g (08/18/19 2121)  . feeding supplement (OSMOLITE 1.5 CAL) 1,000 mL (08/19/19 0354)  . heparin 1,150 Units/hr (08/18/19 2027)  . valproate sodium 250 mg (08/19/19 1152)    Scheduled Medications: . amiodarone  400 mg Per Tube Daily  . ARIPiprazole  10 mg Per Tube BID  . atorvastatin  20 mg Per NG tube Daily  . chlorhexidine  15 mL Mouth Rinse BID  . Chlorhexidine Gluconate Cloth  6 each Topical Q0600  . Chlorhexidine Gluconate Cloth  6 each Topical Q0600  . Chlorhexidine Gluconate Cloth  6 each Topical Q0600  . feeding supplement (PRO-STAT SUGAR FREE 64)  30 mL Per Tube Daily  . heparin      . insulin aspart  0-9 Units Subcutaneous Q4H  . mouth rinse  15 mL Mouth Rinse BID  . midodrine  10 mg Per Tube TID WC  . pantoprazole (PROTONIX) IV  40 mg Intravenous Q24H  . sodium chloride flush  10-40 mL Intracatheter Q12H  . Thrombi-Pad  1 each Topical Once    have reviewed scheduled and prn medications.  Physical Exam: General: Alert awake but remains encephalopathic Heart:RRR, s1s2 nl, no rubs Lungs: Bibasal crackles, no increased work of breathing Abdomen:soft, Non-tender, non-distended Extremities: Trace edema Dialysis Access: Right IJ temporary catheter, site clean  Nayah Lukens Prasad Miryah Ralls 08/19/2019,11:59 AM  LOS: 16 days   Pager: ID:[phone]

## 2019-08-19 NOTE — Consult Note (Addendum)
NEURO HOSPITALIST CONSULT NOTE   Requesting physician: Dr. Sloan Leiter  Reason for Consult:AMS, Strokes on MRI  History obtained from:  Chart review  HPI:                                                                                                                                          Jaclyn Day is an 73 y.o. female  With PMH persistent a. Fib ( eliquis), HTN, CKD, CHF who presented to [facility] after cardiac arrest. Neurology consulted for continued AMS.  08/03/2019 patient was admitted s/p cardiac arrest. She received 10 mins of CPR and 1 Epi with ROSC.  She received TTM36 celsius. Urine cx: E. Coli, bacteremia. Started on antibiotics.  10/30 CRRT, 11/7 neurology consulted for continued persistent AMS. Recommended EEG and MRI.  MRI: several small acute infarcts in white matter of the superior frontal lobes and left occipital lobe. No hemorrhage. Several small subacute Bilateral cerebellar infarcts EEG: pending   08/03/2019 EEG: IMPRESSION: This study is suggestive of severe diffuse encephalopathy, non specific to etiology. No seizures or epileptiform discharges were seen throughout the recording.  The excessive beta activity seen in the background is most likely due to the effect of benzodiazepine. Past Medical History:  Diagnosis Date  . Anemia   . Chronic diastolic CHF (congestive heart failure) ([place])   . CKD (chronic kidney disease), stage IV ([place])   . Hypertension   . Persistent atrial fibrillation ([place])    a. dx 01/2019 s/p TEE DCCV.  Marland Kitchen Renal disorder   . Unilateral congenital absence of kidney     Past Surgical History:  Procedure Laterality Date  . CARDIOVERSION N/A 01/31/2019   Procedure: CARDIOVERSION;  Surgeon: Lelon Perla, MD;  Location: [facility] ENDOSCOPY;  Service: Cardiovascular;  Laterality: N/A;  . TEE WITHOUT CARDIOVERSION N/A 01/31/2019   Procedure: TRANSESOPHAGEAL ECHOCARDIOGRAM (TEE);  Surgeon: Lelon Perla, MD;  Location: [facility]  ENDOSCOPY;  Service: Cardiovascular;  Laterality: N/A;    Family History  Problem Relation Age of Onset  . Atrial fibrillation Father   . Atrial fibrillation Brother         Social History:  reports that she has never smoked. She has never used smokeless tobacco. She reports previous alcohol use. She reports previous drug use.  Allergies  Allergen Reactions  . Codeine Other (See Comments)    Reaction not recalled by family- was told to never take this    MEDICATIONS:  Scheduled: . amiodarone  400 mg Per Tube Daily  . ARIPiprazole  10 mg Per Tube BID  . atorvastatin  20 mg Per NG tube Daily  . chlorhexidine  15 mL Mouth Rinse BID  . Chlorhexidine Gluconate Cloth  6 each Topical Q0600  . Chlorhexidine Gluconate Cloth  6 each Topical Q0600  . Chlorhexidine Gluconate Cloth  6 each Topical Q0600  . feeding supplement (PRO-STAT SUGAR FREE 64)  30 mL Per Tube Daily  . insulin aspart  0-9 Units Subcutaneous Q4H  . mouth rinse  15 mL Mouth Rinse BID  . midodrine  10 mg Per Tube TID WC  . pantoprazole (PROTONIX) IV  40 mg Intravenous Q24H  . sodium chloride flush  10-40 mL Intracatheter Q12H  . Thrombi-Pad  1 each Topical Once   Continuous: . sodium chloride Stopped (08/15/19 1325)  . sodium chloride    . sodium chloride    .  ceFAZolin (ANCEF) IV 2 g (08/18/19 2121)  . feeding supplement (OSMOLITE 1.5 CAL) 1,000 mL (08/19/19 0354)  . heparin 1,150 Units/hr (08/18/19 2027)  . valproate sodium 250 mg (08/19/19 1152)   [id number] chloride, sodium chloride, acetaminophen (TYLENOL) oral liquid 160 mg/5 mL, alteplase, fentaNYL (SUBLIMAZE) injection, heparin, hydrALAZINE, lidocaine (PF), lidocaine-prilocaine, pentafluoroprop-tetrafluoroeth, sodium chloride flush   ROS:                                                                                                                                         unobtainable from patient due to mental status  Blood pressure 119/64, pulse 99, temperature 98.2 F (36.8 C), temperature source Oral, resp. rate (!) 34, height 5\' 6"  (1.676 m), weight 77.2 kg, SpO2 97 %.   Last known well-unclear tPA given-no-unclear last known normal. Modified Rankin-5  General Examination:                                                                                                       Physical Exam  Constitutional: Appears well-developed and well-nourished.  Psych: Affect appropriate to situation Eyes: Normal external eye and conjunctiva. HENT: Normocephalic, no lesions, without obvious abnormality.   Musculoskeletal-no joint tenderness, deformity generalized edema Cardiovascular: Normal rate and regular rhythm.  Respiratory: Effort normal, non-labored breathing saturations WNL on 2L 02 GI: Soft.  No distension. There is no tenderness.  Skin: WDI  Neurological Examination Mental Status: Alert, oriented to name. Speech fluent without evidence of aphasia.  Able to follow some simple commands. Tremors  noted in left arm. And right foot.  Cranial Nerves: II: blinks inconsistently to threat III,IV, VI: ptosis present on left eye,  Patient seems to stare/ fixate, but eyes do cross midline. pupils equal, round, reactive to light V,VII: smile symmetric,  VIII: hearing normal bilaterally  Motor: Right upper and lower extremity are 2/5.  Left upper and lower extremity at least 3/5.   Right side is slightlyweaker than the left Tone and bulk:normal tone throughout; no atrophy noted Gait: deferred NIHSS 1a Level of Conscious.: 0 1b LOC Questions: 2 1c LOC Commands: 0 2 Best Gaze: 0 3 Visual: 0 4 Facial Palsy: 0 5a Motor Arm - left: 1 5b Motor Arm - Right: 1 6a Motor Leg - Left: 2 6b Motor Leg - Right: 2 7 Limb Ataxia: 0 8 Sensory: 0 9 Best Language: 1 10 Dysarthria: 2 11 Extinct. and Inatten.: 0 TOTAL: 11   Lab  Results: Basic Metabolic Panel: Recent Labs  Lab 08/15/19 1713  08/16/19 0500 08/16/19 1630 08/17/19 0222 08/18/19 0249 08/18/19 1931 08/19/19 0420  NA  --    < > 135  --  135 134* 133* 134*  K  --    < > 4.1  --  4.6 5.5* 5.9* 5.4*  CL  --   --  99  --  98 99 98 97*  CO2  --   --  26  --  24 24 23 25   GLUCOSE  --   --  122*  --  115* 119* 151* 100*  BUN  --   --  25*  --  47* 67* 80* 66*  CREATININE  --   --  2.09*  --  2.99* 3.70* 3.99* 3.46*  CALCIUM  --   --  8.3*  --  8.4* 8.3* 8.4* 8.6*  MG 1.9  --  2.2 2.1 2.2 2.2  --   --   PHOS 3.0  --  4.8* 4.5 4.6 4.8*  --  5.2*   < > = values in this interval not displayed.    CBC: Recent Labs  Lab 08/15/19 0331 08/15/19 2040 08/16/19 0500 08/18/19 0249 08/18/19 1931 08/19/19 0420  WBC 25.7*  --  21.6* 18.7* 18.2* 17.6*  HGB 7.8* 8.5* 8.2* 7.5* 7.5* 7.7*  HCT 26.1* 25.0* 27.8* 24.0* 24.3* 25.9*  MCV 95.6  --  95.9 93.0 93.1 94.9  PLT 287  --  371 384 393 409*    Imaging: Ct Head Wo Contrast  Result Date: 08/18/2019 CLINICAL DATA:  Encephalopathy EXAM: CT HEAD WITHOUT CONTRAST TECHNIQUE: Contiguous axial images were obtained from the base of the skull through the vertex without intravenous contrast. COMPARISON:  August 15, 2019 FINDINGS: Brain: No evidence of acute infarction, hemorrhage, hydrocephalus, extra-axial collection or mass lesion/mass effect. Age related atrophy and chronic microvascular ischemic changes are again noted. Vascular: No hyperdense vessel or unexpected calcification. Skull: Normal. Negative for fracture or focal lesion. Sinuses/Orbits: Pansinusitis is again noted. The mastoid air cells are essentially clear. Other: None. IMPRESSION: No acute abnormality.  No significant interval change. Electronically Signed   By: Constance Holster M.D.   On: 08/18/2019 20:20   [name] Contrast  Result Date: 08/19/2019 CLINICAL DATA:  73 year old female with altered mental status. Cardiac arrest, query anoxic  injury. EXAM: MRI HEAD WITHOUT CONTRAST TECHNIQUE: Multiplanar, multiecho pulse sequences of the brain and surrounding structures were obtained without intravenous contrast. COMPARISON:  Head CTs 08/18/2019 and earlier. FINDINGS: Brain: There are multiple small scattered  cerebral white matter foci of abnormal diffusion in both superior frontal lobes (series 5, image 88). These appear mildly restricted on ADC. The largest is in the left posterior centrum semiovale (series 5, image 87 and series 7, image 55) which is restricted. There is also a small focus in the left periatrial white matter. No other convincing restricted diffusion. There appear to be small subacute or chronic infarcts in the bilateral cerebellum (series 15, image 7). No definite anoxic injury in the bilateral deep gray nuclei. No acute intracranial hemorrhage or mass effect. No midline shift, mass effect, evidence of mass lesion, ventriculomegaly, extra-axial collection or acute intracranial hemorrhage. Cervicomedullary junction and pituitary are within normal limits. No chronic cortical encephalomalacia identified. Vascular: Major intracranial vascular flow voids are preserved. Skull and upper cervical spine: Negative visible cervical spine. Visualized bone marrow signal is within normal limits. Sinuses/Orbits: No acute orbit soft tissue findings. Mild to moderate paranasal sinus mucosal thickening. Trace left mastoid fluid. Other: Small volume retained secretions in the nasopharynx. Right mastoids are clear. Visible internal auditory structures appear normal. IMPRESSION: 1. Several scattered small acute infarcts in the white matter of the superior frontal lobes, left occipital lobe. No associated hemorrhage or mass effect. 2. Several small subacute or perhaps chronic infarcts in the bilateral cerebellum. 3. No definite anoxic injury to the deep gray nuclei, and no other acute intracranial abnormality identified. Electronically Signed   By: Genevie Ann  M.D.   On: 08/19/2019 14:18    Assessment:  Jaclyn Day is an 73 y.o. female With PMH persistent a. Fib ( eliquis), HTN, CKD, CHF who presented to [facility] after cardiac arrest. 10 mins CPR 1 epi and had ROSC. Neurology consulted for continued AMS.the [facility] on 11/3 and 11/6 both showed nothing acute. MRI showed several acute infarcts frontal lobes and left occipital lobe. And bilateral subacute infarcts in cerebellum. Patient is uremic (66), creatinine 3.46. patient has mostly been afebrile in the past 24 hours but did spike to 100.3 about 1900 yesterday. she is on cefazolin for bacteremia, and receives dialysis.   Multiple strokes on the MRI appear to be either cardioembolic or watershed in appearance.   Multiple etiologies can be entertained-cardiac arrest leading to hypoperfusion leading to watershed infarcts, atrial fibrillation leading to cardioembolic infarcts, cardiac arrest leading to embolic infarcts, carotid stenosis leading to atheroembolic versus watershed infarcts. Other etiologies could include strokes from septic emboli from infective endocarditis given bacteremia.  Vascular imaging as well as echocardiogram should be pursued.  Recommendations: -Do not think she needs permissive hypertension -Continue telemetry -2D echo, might need TEE -I would continue anticoagulation since the strokes are not too big in size and less likely to be amenable to hemorrhagic transformation, with the thought process that it probably has been a few days since she has had the strokes and they have not bled till now. -PT, OT, speech therapy -A1c, lipid panel -Carotid dopplers and MRA head (unable to tolerate CTA due to deranged renal function) -Stroke team to follow  -- Laurey Morale, MSN, NP-C [facility] [phone]  Attending Neurohospitalist Addendum Patient seen and examined with APP/Resident. Agree with the history and physical as documented above. Agree with the plan as  documented, which I helped formulate. I have independently reviewed the chart, obtained history, review of systems and examined the patient.I have personally reviewed pertinent head/neck/spine imaging (CT/MRI). Please feel free to call with any questions. --- Amie Portland, MD [facility] Pager: [phone]  If 7pm to 7am,  please call on call as listed on AMION.

## 2019-08-19 NOTE — Progress Notes (Signed)
Alerted floor RN Urban Gibson patient will be done on Sunday

## 2019-08-19 NOTE — Consult Note (Signed)
Consultation Note Date: 08/19/2019   Patient Name: Jaclyn Day  DOB: 05/07/1946  MRN: [id number]  Age / Sex: 73 y.o., female  PCP: Cyndi Bender, PA-C Referring Physician: Barb Merino, MD  Reason for Consultation: Establishing goals of care  HPI/Patient Profile: 73 y.o. female  with past medical history of CKD IV with unilateral kidney, CHF, a fib, HTN admitted on 08/03/2019 with cardiac arrest requiring intubation. Workup revealed urosepsis, MSSA bacteremia. Initial ECHO noted EF 20-25% TEE repeated and EF improved to 60%. Patient remains dysphagic s/p intubation, NPO. Has AKI superimposed on CKD, now requiring intermittent dialysis. Mental status has waxed and waned since extubation. MRI head today showing several small infarcts in frontal and occipital lobe- per neuro embolic vs watershed. Palliative medicine consulted for [place].   Clinical Assessment and Goals of Care:  I have reviewed medical records including EPIC notes, labs and imaging, received report from [place], Therapist, sports, assessed the patient and then met at the bedside along with [name]- patient's HCPOA and her sister, Eustaquio Maize  to discuss diagnosis prognosis, GOC, EOL wishes, disposition and options.  I introduced Palliative Medicine as specialized medical care for people living with serious illness. It focuses on providing relief from the symptoms and stress of a serious illness. The goal is to improve quality of life for both the patient and the family.  We discussed a brief life review of the patient. She has had significant cognitive deficits through her whole lifetime. Was cared for by her mother until her mother died- then sister took over care. Beth gave HCPOA to [place] after several years due to his evident love and commitment to her. In my discussion with Konrad Dolores is was evident he also has cognitive deficit and needs support with decision making- Eustaquio Maize  is in agreement and they are making decisions together.   As far as functional and nutritional status- prior to this admission- [name] notes that patient had been falling frequently and had "sprained her ankle". He states she has not been able to walk for 6 weeks. She has also lost significant amount of weight.    We discussed her current illness and what it means in the larger context of her on-going co-morbidities.  Natural disease trajectory and expectations at EOL were discussed.   I attempted to elicit values and goals of care important to the patient. Both [name] and Beth note patient would not want to suffer, would not want to have permanent disability. They also do not want this for her.    The difference between aggressive medical intervention and comfort care was considered in light of the patient's goals of care.   A great amount of time was spent reviewing patient's previous health, and explaining patient's medical problems and issues she has had during this admission to help aide [name] and Betty's understanding of her current status.   GOC was discussed- Inez Catalina and Konrad Dolores stated that patient had told them she was dying.   Advanced directives, concepts specific to code status, artifical feeding and hydration,  and rehospitalization were considered and discussed.  Patient has NG tube in place- but will likely require PEG if mental status does not improve. Discussed role of artificial feeding and hydration in EOL care- Inez Catalina concerned that patient would be starving- we reviewed comfort measures and process of dying. We discussed if comfort care path was chosen- patient would likely die from kidney failure and heart failure- she would not be dying from not eating or drinking. However, we did discuss that losing the ability to eat and drink in the setting of chronic life- limiting illness is usually a marker of a dying process.   Questions and concerns were addressed.  Hard Choices booklet left  for review. The family was encouraged to call with questions or concerns.   Primary Decision Maker HCPOA- [name] Stutts   SUMMARY OF RECOMMENDATIONS -Continue current scope of care -[name] and Inez Catalina considering DNR status and further aggressive medical care vs transition to comfort- if transitioned to comfort- they would consider taking her home with [place] notes he would prefer if Ahmiyah could make these decisions herself- discussed with them that her current mental status was such that she would not be able to, encouraged him to consider what she would tell us, if she could be in the room during our discussion.  -PMT contact information given- plan for Inez Catalina and [name] to followup either with primary team regarding [place] or they will call PMT  -Patient with increased RR during examination- rapid response RN evaluated- other vitals WNL- she is denying pain- but noted per chest xray she has multiple displaced rib fractures- this was discussed with family and they agree to low dose pain medication- hydromorphone .44m ordered q4hr prn for pain      Code Status/Advance Care Planning:  Full code  Palliative Prophylaxis:   Aspiration and Frequent Pain Assessment  Additional Recommendations (Limitations, Scope, Preferences):  Full Scope Treatment  Prognosis:    Unable to determine  Discharge Planning: To Be Determined  Primary Diagnoses: Present on Admission: . Cardiac arrest ([place]   I have reviewed the medical record, interviewed the patient and family, and examined the patient. The following aspects are pertinent.  Past Medical History:  Diagnosis Date  . Anemia   . Chronic diastolic CHF (congestive heart failure) ([place]   . CKD (chronic kidney disease), stage IV ([place]   . Hypertension   . Persistent atrial fibrillation ([place]    a. dx 01/2019 s/p TEE DCCV.  .Marland KitchenRenal disorder   . Unilateral congenital absence of kidney    Social History   Socioeconomic History  .  Marital status: Single    Spouse name: Not on file  . Number of children: Not on file  . Years of education: Not on file  . Highest education level: Not on file  Occupational History  . Not on file  Social Needs  . Financial resource strain: Not on file  . Food insecurity    Worry: Not on file    Inability: Not on file  . Transportation needs    Medical: Not on file    Non-medical: Not on file  Tobacco Use  . Smoking status: Never Smoker  . Smokeless tobacco: Never Used  Substance and Sexual Activity  . Alcohol use: Not Currently  . Drug use: Not Currently  . Sexual activity: Not on file  Lifestyle  . Physical activity    Days per week: Not on file    Minutes per session: Not on file  .  Stress: Not on file  Relationships  . Social Herbalist on phone: Not on file    Gets together: Not on file    Attends religious service: Not on file    Active member of club or organization: Not on file    Attends meetings of clubs or organizations: Not on file    Relationship status: Not on file  Other Topics Concern  . Not on file  Social History Narrative   Lives with her boyfriend in [place]. He is her POA.    Family History  Problem Relation Age of Onset  . Atrial fibrillation Father   . Atrial fibrillation Brother    Scheduled Meds: .  stroke: mapping our early stages of recovery book   Does not apply Once  . amiodarone  400 mg Per Tube Daily  . ARIPiprazole  10 mg Per Tube BID  . atorvastatin  20 mg Per NG tube Daily  . chlorhexidine  15 mL Mouth Rinse BID  . Chlorhexidine Gluconate Cloth  6 each Topical Q0600  . Chlorhexidine Gluconate Cloth  6 each Topical Q0600  . Chlorhexidine Gluconate Cloth  6 each Topical Q0600  . feeding supplement (PRO-STAT SUGAR FREE 64)  30 mL Per Tube Daily  . insulin aspart  0-9 Units Subcutaneous Q4H  . mouth rinse  15 mL Mouth Rinse BID  . midodrine  10 mg Per Tube TID WC  . pantoprazole (PROTONIX) IV  40 mg Intravenous Q24H   . sodium chloride flush  10-40 mL Intracatheter Q12H  . Thrombi-Pad  1 each Topical Once   Continuous Infusions: . sodium chloride Stopped (08/15/19 1325)  . sodium chloride    . sodium chloride    .  ceFAZolin (ANCEF) IV 2 g (08/18/19 2121)  . feeding supplement (OSMOLITE 1.5 CAL) 1,000 mL (08/19/19 0354)  . heparin 1,150 Units/hr (08/19/19 1706)  . valproate sodium 250 mg (08/19/19 1708)   PRN Meds:.sodium chloride, sodium chloride, acetaminophen (TYLENOL) oral liquid 160 mg/5 mL, alteplase, fentaNYL (SUBLIMAZE) injection, heparin, hydrALAZINE, HYDROmorphone (DILAUDID) injection, lidocaine (PF), lidocaine-prilocaine, pentafluoroprop-tetrafluoroeth, sodium chloride flush Medications Prior to Admission:  Prior to Admission medications   Medication Sig Start Date End Date Taking? Authorizing Provider  acetaminophen (TYLENOL) 500 MG tablet Take 500-1,000 mg by mouth every 8 (eight) hours as needed for mild pain or headache.    Yes [provider]  amLODipine (NORVASC) 10 MG tablet Take 10 mg by mouth daily. 12/30/18  Yes [provider]  apixaban (ELIQUIS) 5 MG TABS tablet Take 1 tablet (5 mg total) by mouth 2 (two) times daily. 01/31/19  Yes Wilber Oliphant, MD  ARIPiprazole (ABILIFY) 10 MG tablet Take 10 mg by mouth 2 (two) times daily.  12/27/18  Yes [provider]  atorvastatin (LIPITOR) 20 MG tablet Take 20 mg by mouth daily.   Yes [provider]  brimonidine (ALPHAGAN) 0.15 % ophthalmic solution Place 1 drop into the left eye 2 (two) times daily.    Yes [provider]  divalproex (DEPAKOTE) 500 MG DR tablet Take 1,000 mg by mouth at bedtime. 12/11/18  Yes [provider]  dorzolamide-timolol (COSOPT) 22.3-6.8 MG/ML ophthalmic solution Place 1 drop into both eyes 2 (two) times daily. 01/24/19  Yes [provider]  DULoxetine (CYMBALTA) 60 MG capsule Take 60 mg by mouth daily. 01/01/19  Yes [provider]  furosemide  (LASIX) 40 MG tablet Take 1 tablet (40 mg total) by mouth daily. 02/01/19  Yes Wilber Oliphant, MD  hydrALAZINE (APRESOLINE) 25 MG tablet Take 25 mg by mouth 2 (two) times daily. 05/09/19  Yes [provider]  metoprolol succinate (TOPROL-XL) 50 MG 24 hr tablet Take 100 mg by mouth daily. 01/23/19  Yes [provider]  traMADol (ULTRAM) 50 MG tablet Take 50 mg by mouth 2 (two) times daily as needed (for severe knee pain).  07/17/19  Yes [provider]   Allergies  Allergen Reactions  . Codeine Other (See Comments)    Reaction not recalled by family- was told to never take this   Physical Exam Vitals signs and nursing note reviewed.  Constitutional:      Comments: Frail, thin  Cardiovascular:     Rate and Rhythm: Normal rate and regular rhythm.  Pulmonary:     Comments: Increased rate Skin:    Coloration: Skin is pale.  Neurological:     Mental Status: She is disoriented.     Comments: Not following commands, answers simple yes/no questions- answers not consistent     Vital Signs: BP 119/64 (BP Location: Left Arm)   Pulse 76   Temp 98.2 F (36.8 C) (Oral)   Resp 20   Ht '5\' 6"'  (1.676 m)   Wt 77.2 kg   SpO2 97%   BMI 27.47 kg/m  Pain Scale: 0-10 POSS *See Group Information*: 1-Acceptable,Awake and alert Pain Score: 0-No pain   SpO2: SpO2: 97 % O2 Device:SpO2: 97 % O2 Flow Rate: .O2 Flow Rate (L/min): 2 L/min  IO: Intake/output summary:   Intake/Output Summary (Last 24 hours) at 08/19/2019 1742 Last data filed at 08/19/2019 1740 Gross per 24 hour  Intake -  Output 2475 ml  Net -2475 ml    LBM: Last BM Date: 08/18/19 Baseline Weight: Weight: 87.5 kg Most recent weight: Weight: 77.2 kg     Palliative Assessment/Data: PPS: 10%     Thank you for this consult. Palliative medicine will continue to follow and assist as needed.   Time In: 1500 Time Out: 1630 Time Total: 90 mins Prolonged services- yes Greater than 50%  of this time was  spent counseling and coordinating care related to the above assessment and plan.  Signed by: Mariana Kaufman, AGNP-C Palliative Medicine    Please contact Palliative Medicine Team phone at [phone] for questions and concerns.  For individual provider: See Shea Evans

## 2019-08-20 ENCOUNTER — Inpatient Hospital Stay (HOSPITAL_COMMUNITY): Payer: 59

## 2019-08-20 ENCOUNTER — Other Ambulatory Visit (HOSPITAL_COMMUNITY): Payer: 59

## 2019-08-20 DIAGNOSIS — S2249XA Multiple fractures of ribs, unspecified side, initial encounter for closed fracture: Secondary | ICD-10-CM | POA: Diagnosis not present

## 2019-08-20 DIAGNOSIS — R4182 Altered mental status, unspecified: Secondary | ICD-10-CM | POA: Diagnosis not present

## 2019-08-20 DIAGNOSIS — B9561 Methicillin susceptible Staphylococcus aureus infection as the cause of diseases classified elsewhere: Secondary | ICD-10-CM | POA: Diagnosis not present

## 2019-08-20 DIAGNOSIS — R7881 Bacteremia: Secondary | ICD-10-CM | POA: Diagnosis not present

## 2019-08-20 LAB — CBC
HCT: 24.3 % — ABNORMAL LOW (ref 36.0–46.0)
Hemoglobin: 7.3 g/dL — ABNORMAL LOW (ref 12.0–15.0)
MCH: 28.4 pg (ref 26.0–34.0)
MCHC: 30 g/dL (ref 30.0–36.0)
MCV: 94.6 fL (ref 80.0–100.0)
Platelets: 424 10*3/uL — ABNORMAL HIGH (ref 150–400)
RBC: 2.57 MIL/uL — ABNORMAL LOW (ref 3.87–5.11)
RDW: 17.7 % — ABNORMAL HIGH (ref 11.5–15.5)
WBC: 18 10*3/uL — ABNORMAL HIGH (ref 4.0–10.5)
nRBC: 0 % (ref 0.0–0.2)

## 2019-08-20 LAB — BASIC METABOLIC PANEL
Anion gap: 13 (ref 5–15)
BUN: 79 mg/dL — ABNORMAL HIGH (ref 8–23)
CO2: 24 mmol/L (ref 22–32)
Calcium: 8.6 mg/dL — ABNORMAL LOW (ref 8.9–10.3)
Chloride: 98 mmol/L (ref 98–111)
Creatinine, Ser: 3.84 mg/dL — ABNORMAL HIGH (ref 0.44–1.00)
GFR calc Af Amer: 13 mL/min — ABNORMAL LOW (ref >60)
GFR calc non Af Amer: 11 mL/min — ABNORMAL LOW (ref >60)
Glucose, Bld: 135 mg/dL — ABNORMAL HIGH (ref 70–99)
Potassium: 6 mmol/L — ABNORMAL HIGH (ref 3.5–5.1)
Sodium: 135 mmol/L (ref 135–145)

## 2019-08-20 LAB — LIPID PANEL
Cholesterol: 92 mg/dL (ref 0–200)
HDL: 26 mg/dL — ABNORMAL LOW (ref >40)
LDL Cholesterol: 50 mg/dL (ref 0–99)
Total CHOL/HDL Ratio: 3.5 RATIO
Triglycerides: 79 mg/dL (ref <150)
VLDL: 16 mg/dL (ref 0–40)

## 2019-08-20 LAB — HEMOGLOBIN A1C
Hgb A1c MFr Bld: 5.9 % — ABNORMAL HIGH (ref 4.8–5.6)
Mean Plasma Glucose: 122.63 mg/dL

## 2019-08-20 LAB — GLUCOSE, CAPILLARY
Glucose-Capillary: 110 mg/dL — ABNORMAL HIGH (ref 70–99)
Glucose-Capillary: 110 mg/dL — ABNORMAL HIGH (ref 70–99)
Glucose-Capillary: 122 mg/dL — ABNORMAL HIGH (ref 70–99)
Glucose-Capillary: 125 mg/dL — ABNORMAL HIGH (ref 70–99)
Glucose-Capillary: 126 mg/dL — ABNORMAL HIGH (ref 70–99)
Glucose-Capillary: 143 mg/dL — ABNORMAL HIGH (ref 70–99)

## 2019-08-20 LAB — PHOSPHORUS: Phosphorus: 5.1 mg/dL — ABNORMAL HIGH (ref 2.5–4.6)

## 2019-08-20 LAB — HEPARIN LEVEL (UNFRACTIONATED)
Heparin Unfractionated: 0.47 IU/mL (ref 0.30–0.70)
Heparin Unfractionated: 0.52 IU/mL (ref 0.30–0.70)

## 2019-08-20 LAB — AMMONIA: Ammonia: <9 umol/L — ABNORMAL LOW (ref 9–35)

## 2019-08-20 MED ORDER — HEPARIN SODIUM (PORCINE) 1000 UNIT/ML DIALYSIS: 1000.0000 [IU] | INTRAMUSCULAR | Status: DC | PRN

## 2019-08-20 MED ORDER — PENTAFLUOROPROP-TETRAFLUOROETH EX AERO: 1.0000 "application " | INHALATION_SPRAY | CUTANEOUS | Status: DC | PRN

## 2019-08-20 MED ORDER — ALTEPLASE 2 MG IJ SOLR: 2.0000 mg | Freq: Once | INTRAMUSCULAR | Status: DC | PRN

## 2019-08-20 MED ORDER — SODIUM CHLORIDE 0.9 % IV SOLN: 100.0000 mL | INTRAVENOUS | Status: DC | PRN

## 2019-08-20 MED ORDER — HEPARIN SODIUM (PORCINE) 1000 UNIT/ML IJ SOLN
INTRAMUSCULAR | Status: AC
  Filled 2019-08-20: qty 4

## 2019-08-20 MED ORDER — CEFAZOLIN SODIUM-DEXTROSE 1-4 GM/50ML-% IV SOLN
1.0000 g | Freq: Every day | INTRAVENOUS | Status: DC
  Administered 2019-08-20 – 2019-08-22 (×3): 1 g via INTRAVENOUS
  Filled 2019-08-20 (×3): qty 50

## 2019-08-20 MED ORDER — LIDOCAINE HCL (PF) 1 % IJ SOLN: 5.0000 mL | INTRAMUSCULAR | Status: DC | PRN

## 2019-08-20 MED ORDER — HEPARIN SODIUM (PORCINE) 1000 UNIT/ML DIALYSIS: 20.0000 [IU]/kg | INTRAMUSCULAR | Status: DC | PRN

## 2019-08-20 MED ORDER — LIDOCAINE-PRILOCAINE 2.5-2.5 % EX CREA: 1.0000 "application " | TOPICAL_CREAM | CUTANEOUS | Status: DC | PRN

## 2019-08-20 NOTE — Progress Notes (Signed)
PT Cancellation Note  Patient Details Name: Jaclyn Day MRN: [id number] DOB: Aug 25, 1946   Cancelled Treatment:    Reason Eval/Treat Not Completed: Patient at procedure or test/unavailable. Pt currently off unit for HD. Chart reviewed and noted pt with new CVA on MRI and will check back as schedule allows to reassess functional status.   Thelma Comp 08/20/2019, 9:14 AM   Rolinda Roan, PT, DPT Acute Rehabilitation Services Pager: [phone] Office: [phone]

## 2019-08-20 NOTE — Progress Notes (Signed)
White for Heparin  Indication: atrial fibrillation  Allergies  Allergen Reactions  . Codeine Other (See Comments)    Reaction not recalled by family- was told to never take this    Patient Measurements: Height: 5\' 6"  (167.6 cm) Weight: 169 lb 12.1 oz (77 kg) IBW/kg (Calculated) : 59.3 Heparin Dosing Weight: 78 kg  Vital Signs: Temp: 97.9 F (36.6 C) (11/08 0456) Temp Source: Oral (11/08 0456) BP: 132/73 (11/08 0735) Pulse Rate: 120 (11/08 0735)  Labs: Recent Labs    08/18/19 1931 08/19/19 0420 08/19/19 1214 08/19/19 2057 08/20/19 0523  HGB 7.5* 7.7*  --   --  7.3*  HCT 24.3* 25.9*  --   --  24.3*  PLT 393 409*  --   --  424*  HEPARINUNFRC  --  1.28* 0.62 0.63 0.47  CREATININE 3.99* 3.46*  --   --  3.84*    Estimated Creatinine Clearance: 13.9 mL/min (A) (by C-G formula based on SCr of 3.84 mg/dL (H)).  Assessment: Pt is a 73 y/o female admitted s/p out of hospital cardiac arrest. ROSC was achieved and targeted temperature management was initiated. This admission has been complicated by continued encephalopathy, AKI on CKD which has advanced to dialysis, and hypotension.    Pt has a history of A-fib (taking eliquis PTA), diastolic heart failure, HTN, HDL, IDDM, CKD. Pt continues on heparin for Afib.   MRI on 11/7 found several small acute infarcts though no hemorrhage. Heparin goal was adjusted and heparin rate decreased. Heparin level of 0.47 this morning was therapeutic but at the higher end of the range (goal 0.3 - 0.5) on heparin 1000 units/hr and drawn 1 hour early. STAT heparin level recheck was ordered and was unable to be obtained until the patient returned from hemodialysis. Heparin level this afternoon of 0.52 on heparin 1000 units/hr was supratherapeutic. Hgb 7.3. Plt wnl. No reported bleeding.   Goal of Therapy:  Heparin level 0.3-0.5 units/ml Monitor platelets by anticoagulation protocol: Yes   Plan:  Decrease  heparin to 950 units/hr  Check heparin level at 2330 Monitor heparin level, CBC, and S/S of bleeding daily  Cristela Felt, PharmD PGY1 Pharmacy Resident [employer]: [phone]   08/20/2019 8:45 AM

## 2019-08-20 NOTE — Progress Notes (Signed)
PROGRESS NOTE  Jaclyn Day [id number] DOB: 03/21/1946 DOA: 08/03/2019 PCP: Cyndi Bender, PA-C  HPI/Recap of past 24 hours: Patient is 73 year old with history of congenital unilateral kidney, stage IV chronic kidney disease with baseline creatinine about 2.2-2.8, hypertension, persistent A. fib on anticoagulation with Eliquis, chronic diastolic heart failure, history of cardioversion for A. fib admitted to ICU after witnessed cardiac arrest.  Patient from home. 10/22 Admitted post arrest, intubated, initiation of targeted temperature management 36 celsius.   ECHO: LVEF: 20-25%, global hypokinesis, RV midly reduced function with normal size, mild MR, normal pulmonary pressures. Lactate cleared 10/25 - staph identified in blood and vanc started. Still with poor Ur OP. On amio gtt. Off leveophed and neo On Heparin gtt. iHD w/ 1L off 10/26- following simple commands, placed on precedex for agitation, febrile- re-cultured and femoral CVL discontinued  10/28- converted to NSR, TEE  10/30 - CRRT for volume removal Blood culture 10/22 >> MSSA  1/4 COVID 10/22 >> Negative RVP 10/22 >> Negative Urine 10/22 - E colii 10/26 BCx 2 >> neg  Remains in very poor mentation.  08/20/19: Patient was seen and examined at her bedside this morning.  Sister present at bedside.  Her sister reports intermittent, sudden right upper extremity tremors, captured on EEG with no evidence of seizure activity, therefore non epileptic per neurology/seizure specialist.  On [place].  HD planned today.  Assessment/Plan: Principal Problem:   MSSA bacteremia Active Problems:   Cardiac arrest ([place])   Pressure injury of skin   Acute respiratory failure with hypoxia (HCC)   AKI (acute kidney injury) (HCC)   Elevated troponin   Persistent atrial fibrillation (HCC)   Acute on chronic combined systolic and diastolic CHF (congestive heart failure) (HCC)   Acute bacterial endocarditis   Malnutrition of  moderate degree   Advanced care planning/counseling discussion   Goals of care, counseling/discussion   Palliative care by specialist   Closed fracture of multiple ribs   Acute hypoxic respiratory failure due to cardiac arrest and pulmonary edema: Extubated to nasal cannula oxygen.  Keep on oxygen to keep saturation above 92%.  Chest physiotherapy and incentive spirometry if patient can tolerate. She is not much participating.  Acute metabolic encephalopathy suspect multifactorial with recent multiple acute CVA: In the setting of cardiac arrest hypothermia, MSSA bacteremia, prolonged hospitalization, versus others.  CT head 08/16/2019 stable. Remains waxing and waning mental status. Patient had worsening mental status since 08/18/19 evening. ABG was stable.  CT head stable.   On valproic acid as longstanding medications.  Obtain ammonia level. EEG with no evidence of seizure activity. Palliative care team consulted and following. MRI done on 08/19/2019 showed several scattered small acute infarcts in the white matter of the superior frontal lobes, left occipital lobe.  No associated hemorrhage or mass-effect.  Also several small subacute or perhaps chronic infarcts in the bilateral cerebellum. Neurology following.  Appreciate recommendations. TTE pending.  Multiple acute/subacute CVA involving white matter of the superior frontal lobes and left occipital lobe Concern for septic emboli in the setting of MSSA bacteremia; may need TEE to rule out endocarditis.  TTE is pending No hemorrhage, on heparin drip, neurology following.  Okay to continue anticoagulation per neuro.  Continue to closely monitor with neuro checks.  Witnessed cardiac arrest in the setting of history of chronic atrial fibrillation and coronary artery disease: Patient treated with hypothermia protocol.  Followed by cardiology.  Developed intermittent rapid A. fib.  Blood pressures low to tolerate multiple medications.  Initially  treated with Cardizem drip, needed Levophed fo frequent r hypotension and now weaned off. Currently on amiodarone 400 mg daily with prolonged taper and maintenance planned. Remains on heparin infusion, continue heparin infusion until no procedures planned.  Switch to Eliquis after final plans. Patient is a still in A. fib but rate is better controlled.  Acute on chronic systolic heart failure: TTE on 10/22 with EF 20%.  Repeat TEE with improved ejection fraction to 60%.  CAD evaluation pending renal improvement.  MSSA bacteremia with suspected aortic valve vegetation: Repeat cultures negative.  Seen by infectious disease.  Recommended cefazolin until 12/7. TTE pending Continue cefazolin  AKI on CKD stage IV: Suspected AKI likely ATN in the setting of shock ,cardiac arrest and bacteremia.  Started on CRRT.  Now on intermittent hemodialysis.  Last dialysis 08/18/2019.  Kidney ultrasound with no obstruction.  Followed by nephrology.  Plan hemodialysis on 08/20/2019.  Diet: Currently on tube feeding via core track tube.  Continue.  Unable to have swallow evaluation, lethargic.  DVT prophylaxis: Heparin drip. Code Status: Full code Family Communication:  Updated her sister bedside. Disposition Plan: Continue hospitalization pending improvement.  May need a skilled nursing facility on discharge.    Consultants:   Nephrology  Cardiology  PCCM  Palliative care team.   Procedures:   CRRT  HD   Antimicrobials:  Rocephin 10/22 >> 10/25 Vanc (staph in blood ) 10/24  Cefazolin 10/25 >>    Objective: Vitals:   08/20/19 0930 08/20/19 1000 08/20/19 1030 08/20/19 1100  BP: (!) 144/91 (!) 147/79 102/71 (!) 131/91  Pulse: 89 (!) 101 (!) 101 (!) 117  Resp:      Temp:      TempSrc:      SpO2:      Weight:      Height:        Intake/Output Summary (Last 24 hours) at 08/20/2019 1128 Last data filed at 08/19/2019 2137 Gross per 24 hour  Intake --  Output 450 ml  Net -450  ml   Filed Weights   08/19/19 0638 08/20/19 0456 08/20/19 0800  Weight: 77.2 kg 77 kg 77 kg    Exam:   General: 73 y.o. year-old female well developed well nourished in no acute distress.  Alert, minimally interactive and nonverbal.  Cardiovascular: Regular rate and rhythm with no rubs or gallops.  No thyromegaly or JVD noted.    Respiratory: Clear to auscultation with no wheezes or rales. Good inspiratory effort.  Abdomen: Soft nontender nondistended with normal bowel sounds x4 quadrants.  Musculoskeletal: Diffuse dependent edema all throughout.   Psychiatry: Unable to assess mood due to altered mental status.   Data Reviewed: CBC: Recent Labs  Lab 08/16/19 0500 08/18/19 0249 08/18/19 1931 08/19/19 0420 08/20/19 0523  WBC 21.6* 18.7* 18.2* 17.6* 18.0*  HGB 8.2* 7.5* 7.5* 7.7* 7.3*  HCT 27.8* 24.0* 24.3* 25.9* 24.3*  MCV 95.9 93.0 93.1 94.9 94.6  PLT 371 384 393 409* [id number]*   Basic Metabolic Panel: Recent Labs  Lab 08/15/19 1713  08/16/19 0500 08/16/19 1630 08/17/19 0222 08/18/19 0249 08/18/19 1931 08/19/19 0420 08/20/19 0523  NA  --    < > 135  --  135 134* 133* 134* 135  K  --    < > 4.1  --  4.6 5.5* 5.9* 5.4* 6.0*  CL  --   --  99  --  98 99 98 97* 98  CO2  --   --  26  --  24 24 23 25 24   GLUCOSE  --   --  122*  --  115* 119* 151* 100* 135*  BUN  --   --  25*  --  47* 67* 80* 66* 79*  CREATININE  --   --  2.09*  --  2.99* 3.70* 3.99* 3.46* 3.84*  CALCIUM  --   --  8.3*  --  8.4* 8.3* 8.4* 8.6* 8.6*  MG 1.9  --  2.2 2.1 2.2 2.2  --   --   --   PHOS 3.0  --  4.8* 4.5 4.6 4.8*  --  5.2* 5.1*   < > = values in this interval not displayed.   GFR: Estimated Creatinine Clearance: 13.9 mL/min (A) (by C-G formula based on SCr of 3.84 mg/dL (H)). Liver Function Tests: Recent Labs  Lab 08/15/19 1130 08/18/19 1931  AST 16 10*  ALT <5 <5  ALKPHOS 97 91  BILITOT 0.7 0.6  PROT 5.5* 5.1*  ALBUMIN 1.9* 1.7*   No results for input(s): LIPASE, AMYLASE in the  last 168 hours. No results for input(s): AMMONIA in the last 168 hours. Coagulation Profile: No results for input(s): INR, PROTIME in the last 168 hours. Cardiac Enzymes: No results for input(s): CKTOTAL, CKMB, CKMBINDEX, TROPONINI in the last 168 hours. BNP (last 3 results) No results for input(s): PROBNP in the last 8760 hours. HbA1C: Recent Labs    08/20/19 0523  HGBA1C 5.9*   CBG: Recent Labs  Lab 08/19/19 1654 08/19/19 2056 08/20/19 0039 08/20/19 0507 08/20/19 0732  GLUCAP 135* 125* 110* 122* 126*   Lipid Profile: Recent Labs    08/20/19 0523  CHOL 92  HDL 26*  LDLCALC 50  TRIG 79  CHOLHDL 3.5   Thyroid Function Tests: No results for input(s): TSH, T4TOTAL, FREET4, T3FREE, THYROIDAB in the last 72 hours. Anemia Panel: No results for input(s): VITAMINB12, FOLATE, FERRITIN, TIBC, IRON, RETICCTPCT in the last 72 hours. Urine analysis:    Component Value Date/Time   COLORURINE BROWN (A) 08/05/2019 2018   APPEARANCEUR TURBID (A) 08/05/2019 2018   LABSPEC RESULTS UNAVAILABLE DUE TO INTERFERING SUBSTANCE 08/05/2019 2018   PHURINE RESULTS UNAVAILABLE DUE TO INTERFERING SUBSTANCE 08/05/2019 2018   GLUCOSEU RESULTS UNAVAILABLE DUE TO INTERFERING SUBSTANCE (A) 08/05/2019 2018   HGBUR RESULTS UNAVAILABLE DUE TO INTERFERING SUBSTANCE (A) 08/05/2019 2018   BILIRUBINUR RESULTS UNAVAILABLE DUE TO INTERFERING SUBSTANCE (A) 08/05/2019 2018   KETONESUR RESULTS UNAVAILABLE DUE TO INTERFERING SUBSTANCE (A) 08/05/2019 2018   PROTEINUR RESULTS UNAVAILABLE DUE TO INTERFERING SUBSTANCE (A) 08/05/2019 2018   UROBILINOGEN 0.2 05/07/2009 1520   NITRITE RESULTS UNAVAILABLE DUE TO INTERFERING SUBSTANCE (A) 08/05/2019 2018   LEUKOCYTESUR RESULTS UNAVAILABLE DUE TO INTERFERING SUBSTANCE (A) 08/05/2019 2018   Sepsis Labs: @LABRCNTIP (procalcitonin:4,lacticidven:4)  )No results found for this or any previous visit (from the past 240 hour(s)).    Studies: Mr Brain 33 Contrast  Result  Date: 08/19/2019 CLINICAL DATA:  73 year old female with altered mental status. Cardiac arrest, query anoxic injury. EXAM: MRI HEAD WITHOUT CONTRAST TECHNIQUE: Multiplanar, multiecho pulse sequences of the brain and surrounding structures were obtained without intravenous contrast. COMPARISON:  Head CTs 08/18/2019 and earlier. FINDINGS: Brain: There are multiple small scattered cerebral white matter foci of abnormal diffusion in both superior frontal lobes (series 5, image 88). These appear mildly restricted on ADC. The largest is in the left posterior centrum semiovale (series 5, image 87 and series 7, image 55) which is restricted. There is also a small focus  in the left periatrial white matter. No other convincing restricted diffusion. There appear to be small subacute or chronic infarcts in the bilateral cerebellum (series 15, image 7). No definite anoxic injury in the bilateral deep gray nuclei. No acute intracranial hemorrhage or mass effect. No midline shift, mass effect, evidence of mass lesion, ventriculomegaly, extra-axial collection or acute intracranial hemorrhage. Cervicomedullary junction and pituitary are within normal limits. No chronic cortical encephalomalacia identified. Vascular: Major intracranial vascular flow voids are preserved. Skull and upper cervical spine: Negative visible cervical spine. Visualized bone marrow signal is within normal limits. Sinuses/Orbits: No acute orbit soft tissue findings. Mild to moderate paranasal sinus mucosal thickening. Trace left mastoid fluid. Other: Small volume retained secretions in the nasopharynx. Right mastoids are clear. Visible internal auditory structures appear normal. IMPRESSION: 1. Several scattered small acute infarcts in the white matter of the superior frontal lobes, left occipital lobe. No associated hemorrhage or mass effect. 2. Several small subacute or perhaps chronic infarcts in the bilateral cerebellum. 3. No definite anoxic injury to the  deep gray nuclei, and no other acute intracranial abnormality identified. Electronically Signed   By: Genevie Ann M.D.   On: 08/19/2019 14:18    Scheduled Meds:   stroke: mapping our early stages of recovery book   Does not apply Once   amiodarone  400 mg Per Tube Daily   ARIPiprazole  10 mg Per Tube BID   atorvastatin  20 mg Per NG tube Daily   chlorhexidine  15 mL Mouth Rinse BID   Chlorhexidine Gluconate Cloth  6 each Topical Q0600   Chlorhexidine Gluconate Cloth  6 each Topical Q0600   Chlorhexidine Gluconate Cloth  6 each Topical Q0600   feeding supplement (PRO-STAT SUGAR FREE 64)  30 mL Per Tube Daily   heparin       insulin aspart  0-9 Units Subcutaneous Q4H   mouth rinse  15 mL Mouth Rinse BID   midodrine  10 mg Per Tube TID WC   pantoprazole (PROTONIX) IV  40 mg Intravenous Q24H   sodium chloride flush  10-40 mL Intracatheter Q12H   Thrombi-Pad  1 each Topical Once    Continuous Infusions:  sodium chloride Stopped (08/15/19 1325)   sodium chloride     sodium chloride     sodium chloride     sodium chloride      ceFAZolin (ANCEF) IV 2 g (08/19/19 2247)   feeding supplement (OSMOLITE 1.5 CAL) 1,000 mL (08/20/19 0250)   heparin 1,000 Units/hr (08/19/19 2243)   valproate sodium 250 mg (08/20/19 0528)     LOS: 17 days     Kayleen Memos, MD [facility] Pager [phone]  If 7PM-7AM, please contact night-coverage [email or web address] Password [facility] 08/20/2019, 11:28 AM

## 2019-08-20 NOTE — Progress Notes (Signed)
Carotid artery duplex attempted, however there is a line in the right neck with bandaging, prohibiting examination. Please advise.  08/20/2019 2:53 PM Maudry Mayhew, MHA, RVT, RDCS, RDMS

## 2019-08-20 NOTE — Progress Notes (Signed)
Patient came back from HD and with vital signs taken, She went into the red MEWS. I notified Dr. [place] Crane and Puja with RR. Patient has been tachypneic for the past few shifts so that isn't much of a change. Her heart rate is int he 110s to 130s. MD felt like I needed to give PO amio and daily medications and with her coming right from HD, she may need a little time to re adjust. Puja saw patient yesterday and agreed with watching patient and readressing. Vital signs to be checked per protocol. [name], patients significant other at bedside and updated. Will closely monitor

## 2019-08-20 NOTE — Progress Notes (Signed)
PT Cancellation Note  Patient Details Name: Jaclyn Day MRN: [id number] DOB: 1946-05-06   Cancelled Treatment:    Reason Eval/Treat Not Completed: Medical issues which prohibited therapy. Pt back from HD and noted RN's progress note regarding continued tachypnea and tachycardia with HR up into 130's. Will hold mobility with PT this afternoon and assess medical readiness to participate at the next available time.   Thelma Comp 08/20/2019, 12:53 PM   Rolinda Roan, PT, DPT Acute Rehabilitation Services Pager: [phone] Office: [phone]

## 2019-08-20 NOTE — Progress Notes (Addendum)
[facility] NEPHROLOGY PROGRESS NOTE  Assessment/ Plan: Pt is a 73 y.o. yo female with out of hospital cardiac arrest CPR for 10 minutes, history of CKD stage IV, CHF.  She was a started dialysis on 10/25 via right IJ catheter.  Urine culture with E. coli, CRRT initiated on 10/30/120.  #AKI on CKD stage IV: CKD due to hypertension.  AKI likely ATN in the setting of shock, cardiac arrest and bacteremia.  She was on CRRT since 10/30-11/1.   -Kidney ultrasound showed solitary right kidney with cortical thinning and cysts. -Receiving dialysis today.  Patient is still encephalopathic and has acute stroke.  She has poor functional status.  She probably cannot tolerate outpatient HD.  She needs to be able to sit on chair or recliner for HD.  Palliative care is involved. I will hold off on placing [facility] until decision from family about goals of care.  #Hyperkalemia: Dialysis today.  #Acute encephalopathy: No improvement in mental status.  MRI with a stroke.  #Cardiac arrest, acute on chronic systolic CHF: EF [id number] by echo, cardiology is following.  EF improved in TEE.  #MSSA bacteremia: Continue cefazolin. End date six weeks from last negative culture 08/07/19 - 09/18/19.  #Hypotension: Currently off pressor.  Blood pressure is better.  Continue midodrine.  #Acute respiratory failure with hypoxia: HD today with UF.  On 2 L oxygen.  #Paroxysmal atrial fibrillation: Cardiology following.  On amiodarone and heparin drip.  Subjective: Seen and examined.  Receiving dialysis.  Patient is confused.  Review of system limited.  Tolerating HD so far. Objective Vital signs in last 24 hours: Vitals:   08/20/19 0900 08/20/19 0930 08/20/19 1000 08/20/19 1030  BP: (!) 162/87 (!) 144/91 (!) 147/79 102/71  Pulse: (!) 101 89 (!) 101 (!) 101  Resp:      Temp:      TempSrc:      SpO2:      Weight:      Height:       Weight change: -0.4 kg  Intake/Output Summary (Last 24 hours) at 08/20/2019  1054 Last data filed at 08/19/2019 2137 Gross per 24 hour  Intake -  Output 450 ml  Net -450 ml       Labs: Basic Metabolic Panel: Recent Labs  Lab 08/18/19 0249 08/18/19 1931 08/19/19 0420 08/20/19 0523  NA 134* 133* 134* 135  K 5.5* 5.9* 5.4* 6.0*  CL 99 98 97* 98  CO2 24 23 25 24   GLUCOSE 119* 151* 100* 135*  BUN 67* 80* 66* 79*  CREATININE 3.70* 3.99* 3.46* 3.84*  CALCIUM 8.3* 8.4* 8.6* 8.6*  PHOS 4.8*  --  5.2* 5.1*   Liver Function Tests: Recent Labs  Lab 08/15/19 1130 08/18/19 1931  AST 16 10*  ALT <5 <5  ALKPHOS 97 91  BILITOT 0.7 0.6  PROT 5.5* 5.1*  ALBUMIN 1.9* 1.7*   No results for input(s): LIPASE, AMYLASE in the last 168 hours. No results for input(s): AMMONIA in the last 168 hours. CBC: Recent Labs  Lab 08/16/19 0500 08/18/19 0249 08/18/19 1931 08/19/19 0420 08/20/19 0523  WBC 21.6* 18.7* 18.2* 17.6* 18.0*  HGB 8.2* 7.5* 7.5* 7.7* 7.3*  HCT 27.8* 24.0* 24.3* 25.9* 24.3*  MCV 95.9 93.0 93.1 94.9 94.6  PLT 371 384 393 409* 424*   Cardiac Enzymes: No results for input(s): CKTOTAL, CKMB, CKMBINDEX, TROPONINI in the last 168 hours. CBG: Recent Labs  Lab 08/19/19 1654 08/19/19 2056 08/20/19 0039 08/20/19 0507 08/20/19 0732  GLUCAP 135* 125* 110* 122* 126*    Iron Studies: No results for input(s): IRON, TIBC, TRANSFERRIN, FERRITIN in the last 72 hours. Studies/Results: Ct Head Wo Contrast  Result Date: 08/18/2019 CLINICAL DATA:  Encephalopathy EXAM: CT HEAD WITHOUT CONTRAST TECHNIQUE: Contiguous axial images were obtained from the base of the skull through the vertex without intravenous contrast. COMPARISON:  August 15, 2019 FINDINGS: Brain: No evidence of acute infarction, hemorrhage, hydrocephalus, extra-axial collection or mass lesion/mass effect. Age related atrophy and chronic microvascular ischemic changes are again noted. Vascular: No hyperdense vessel or unexpected calcification. Skull: Normal. Negative for fracture or focal  lesion. Sinuses/Orbits: Pansinusitis is again noted. The mastoid air cells are essentially clear. Other: None. IMPRESSION: No acute abnormality.  No significant interval change. Electronically Signed   By: Constance Holster M.D.   On: 08/18/2019 20:20   [name] Contrast  Result Date: 08/19/2019 CLINICAL DATA:  73 year old female with altered mental status. Cardiac arrest, query anoxic injury. EXAM: MRI HEAD WITHOUT CONTRAST TECHNIQUE: Multiplanar, multiecho pulse sequences of the brain and surrounding structures were obtained without intravenous contrast. COMPARISON:  Head CTs 08/18/2019 and earlier. FINDINGS: Brain: There are multiple small scattered cerebral white matter foci of abnormal diffusion in both superior frontal lobes (series 5, image 88). These appear mildly restricted on ADC. The largest is in the left posterior centrum semiovale (series 5, image 87 and series 7, image 55) which is restricted. There is also a small focus in the left periatrial white matter. No other convincing restricted diffusion. There appear to be small subacute or chronic infarcts in the bilateral cerebellum (series 15, image 7). No definite anoxic injury in the bilateral deep gray nuclei. No acute intracranial hemorrhage or mass effect. No midline shift, mass effect, evidence of mass lesion, ventriculomegaly, extra-axial collection or acute intracranial hemorrhage. Cervicomedullary junction and pituitary are within normal limits. No chronic cortical encephalomalacia identified. Vascular: Major intracranial vascular flow voids are preserved. Skull and upper cervical spine: Negative visible cervical spine. Visualized bone marrow signal is within normal limits. Sinuses/Orbits: No acute orbit soft tissue findings. Mild to moderate paranasal sinus mucosal thickening. Trace left mastoid fluid. Other: Small volume retained secretions in the nasopharynx. Right mastoids are clear. Visible internal auditory structures appear  normal. IMPRESSION: 1. Several scattered small acute infarcts in the white matter of the superior frontal lobes, left occipital lobe. No associated hemorrhage or mass effect. 2. Several small subacute or perhaps chronic infarcts in the bilateral cerebellum. 3. No definite anoxic injury to the deep gray nuclei, and no other acute intracranial abnormality identified. Electronically Signed   By: Genevie Ann M.D.   On: 08/19/2019 14:18    Medications: Infusions: . sodium chloride Stopped (08/15/19 1325)  . sodium chloride    . sodium chloride    . sodium chloride    . sodium chloride    .  ceFAZolin (ANCEF) IV 2 g (08/19/19 2247)  . feeding supplement (OSMOLITE 1.5 CAL) 1,000 mL (08/20/19 0250)  . heparin 1,000 Units/hr (08/19/19 2243)  . valproate sodium 250 mg (08/20/19 0528)    Scheduled Medications: .  stroke: mapping our early stages of recovery book   Does not apply Once  . amiodarone  400 mg Per Tube Daily  . ARIPiprazole  10 mg Per Tube BID  . atorvastatin  20 mg Per NG tube Daily  . chlorhexidine  15 mL Mouth Rinse BID  . Chlorhexidine Gluconate Cloth  6 each Topical Q0600  . Chlorhexidine Gluconate Cloth  6 each Topical Q0600  . Chlorhexidine Gluconate Cloth  6 each Topical Q0600  . feeding supplement (PRO-STAT SUGAR FREE 64)  30 mL Per Tube Daily  . heparin      . insulin aspart  0-9 Units Subcutaneous Q4H  . mouth rinse  15 mL Mouth Rinse BID  . midodrine  10 mg Per Tube TID WC  . pantoprazole (PROTONIX) IV  40 mg Intravenous Q24H  . sodium chloride flush  10-40 mL Intracatheter Q12H  . Thrombi-Pad  1 each Topical Once    have reviewed scheduled and prn medications.  Physical Exam: General: Alert awake but remains encephalopathic, unchanged Heart:RRR, s1s2 nl, no rubs Lungs: Bibasal crackles, no increased work of breathing Abdomen:soft, Non-tender, non-distended Extremities: Trace edema Dialysis Access: Right IJ temporary catheter, site clean  Jaclyn Day  Jaclyn Day 08/20/2019,10:54 AM  LOS: 17 days  Pager: [phone]

## 2019-08-20 NOTE — Procedures (Addendum)
Patient Name: MAZARINE PORRITT  MRN: [id number]  Epilepsy Attending: Lora Havens  Referring Physician/Provider: Dr Barb Merino Date: 08/19/2019 Duration: 24.33 mins  Patient history: 73yo F with ams. EEG to evaluate for seizure  Level of alertness: awake, asleep  AEDs during EEG study: VPA  Technical aspects: This EEG study was done with scalp electrodes positioned according to the 10-20 International system of electrode placement. Electrical activity was acquired at a sampling rate of 500Hz  and reviewed with a high frequency filter of 70Hz  and a low frequency filter of 1Hz . EEG data were recorded continuously and digitally stored.   DESCRIPTION: EEG showed continuous generalized 3-6hz  theta-delta slowing. During awake state, no clear posterior dominant rhythm was seen. Sleep was characterized by vertex waves. Hyperventilation and photic stimulation were not performed.  One episode of left hand shaking/tremor was noted. Concomitant eeg before, during and after the event didn't show any EEG change to suggest seizure.   ABNORMALITY - Continuous slow, generalized  IMPRESSION: This study is suggestive of mild to moderate diffuse encephalopathy, non specific to etiology.  No seizures or epileptiform discharges were seen throughout the recording.  One episode of left hand shaking/tremor was noted without EEG change and was therefore likely non epileptic.   Starla Deller Barbra Sarks

## 2019-08-20 NOTE — Progress Notes (Signed)
STROKE TEAM PROGRESS NOTE   HISTORY OF PRESENT ILLNESS (per record) Jaclyn Day is an 73 y.o. female  With PMH persistent a. Fib ( eliquis), HTN, CKD, CHF who presented to [facility] after cardiac arrest. Neurology consulted for continued AMS.  08/03/2019 patient was admitted s/p cardiac arrest. She received 10 mins of CPR and 1 Epi with ROSC.  She received TTM36 celsius. Urine cx: E. Coli, bacteremia. Started on antibiotics.  10/30 CRRT, 11/7 neurology consulted for continued persistent AMS. Recommended EEG and MRI.  MRI: several small acute infarcts in white matter of the superior frontal lobes and left occipital lobe. No hemorrhage. Several small subacute Bilateral cerebellar infarcts EEG: pending   08/03/2019 EEG: IMPRESSION: This study issuggestive of severe diffuse encephalopathy, non specific to etiology.No seizures or epileptiform discharges were seen throughout the recording.   INTERVAL HISTORY Her husband is at bedside, answered all questions.    OBJECTIVE Vitals:   08/20/19 1143 08/20/19 1220 08/20/19 1225 08/20/19 1250  BP: (!) 129/55 (!) 107/51  117/60  Pulse: 98  (!) 115 (!) 104  Resp: (!) 28 (!) 40  (!) 24  Temp: 99 F (37.2 C) 99.4 F (37.4 C) 99.4 F (37.4 C)   TempSrc: Axillary Axillary Oral   SpO2: 98% 91% 93% 95%  Weight: 75.4 kg     Height:        CBC:  Recent Labs  Lab 08/19/19 0420 08/20/19 0523  WBC 17.6* 18.0*  HGB 7.7* 7.3*  HCT 25.9* 24.3*  MCV 94.9 94.6  PLT 409* 424*    Basic Metabolic Panel:  Recent Labs  Lab 08/17/19 0222 08/18/19 0249  08/19/19 0420 08/20/19 0523  NA 135 134*   < > 134* 135  K 4.6 5.5*   < > 5.4* 6.0*  CL 98 99   < > 97* 98  CO2 24 24   < > 25 24  GLUCOSE 115* 119*   < > 100* 135*  BUN 47* 67*   < > 66* 79*  CREATININE 2.99* 3.70*   < > 3.46* 3.84*  CALCIUM 8.4* 8.3*   < > 8.6* 8.6*  MG 2.2 2.2  --   --   --   PHOS 4.6 4.8*  --  5.2* 5.1*   < > = values in this interval not displayed.    Lipid  Panel:     Component Value Date/Time   CHOL 92 08/20/2019 0523   TRIG 79 08/20/2019 0523   HDL 26 (L) 08/20/2019 0523   CHOLHDL 3.5 08/20/2019 0523   VLDL 16 08/20/2019 0523   LDLCALC 50 08/20/2019 0523   HgbA1c:  Lab Results  Component Value Date   HGBA1C 5.9 (H) 08/20/2019   Urine Drug Screen: No results found for: LABOPIA, COCAINSCRNUR, LABBENZ, AMPHETMU, THCU, LABBARB  Alcohol Level No results found for: ETH  IMAGING   Ct Head Wo Contrast  Result Date: 08/18/2019 CLINICAL DATA:  Encephalopathy EXAM: CT HEAD WITHOUT CONTRAST TECHNIQUE: Contiguous axial images were obtained from the base of the skull through the vertex without intravenous contrast. COMPARISON:  August 15, 2019 FINDINGS: Brain: No evidence of acute infarction, hemorrhage, hydrocephalus, extra-axial collection or mass lesion/mass effect. Age related atrophy and chronic microvascular ischemic changes are again noted. Vascular: No hyperdense vessel or unexpected calcification. Skull: Normal. Negative for fracture or focal lesion. Sinuses/Orbits: Pansinusitis is again noted. The mastoid air cells are essentially clear. Other: None. IMPRESSION: No acute abnormality.  No significant interval change. Electronically Signed  By: Constance Holster M.D.   On: 08/18/2019 20:20   [name] Contrast  Result Date: 08/19/2019 CLINICAL DATA:  73 year old female with altered mental status. Cardiac arrest, query anoxic injury. EXAM: MRI HEAD WITHOUT CONTRAST TECHNIQUE: Multiplanar, multiecho pulse sequences of the brain and surrounding structures were obtained without intravenous contrast. COMPARISON:  Head CTs 08/18/2019 and earlier. FINDINGS: Brain: There are multiple small scattered cerebral white matter foci of abnormal diffusion in both superior frontal lobes (series 5, image 88). These appear mildly restricted on ADC. The largest is in the left posterior centrum semiovale (series 5, image 87 and series 7, image 55) which is  restricted. There is also a small focus in the left periatrial white matter. No other convincing restricted diffusion. There appear to be small subacute or chronic infarcts in the bilateral cerebellum (series 15, image 7). No definite anoxic injury in the bilateral deep gray nuclei. No acute intracranial hemorrhage or mass effect. No midline shift, mass effect, evidence of mass lesion, ventriculomegaly, extra-axial collection or acute intracranial hemorrhage. Cervicomedullary junction and pituitary are within normal limits. No chronic cortical encephalomalacia identified. Vascular: Major intracranial vascular flow voids are preserved. Skull and upper cervical spine: Negative visible cervical spine. Visualized bone marrow signal is within normal limits. Sinuses/Orbits: No acute orbit soft tissue findings. Mild to moderate paranasal sinus mucosal thickening. Trace left mastoid fluid. Other: Small volume retained secretions in the nasopharynx. Right mastoids are clear. Visible internal auditory structures appear normal. IMPRESSION: 1. Several scattered small acute infarcts in the white matter of the superior frontal lobes, left occipital lobe. No associated hemorrhage or mass effect. 2. Several small subacute or perhaps chronic infarcts in the bilateral cerebellum. 3. No definite anoxic injury to the deep gray nuclei, and no other acute intracranial abnormality identified. Electronically Signed   By: Genevie Ann M.D.   On: 08/19/2019 14:18   Transthoracic Echocardiogram  08/03/2019 IMPRESSIONS  1. Left ventricular ejection fraction, by visual estimation, is 20 to 25%. The left ventricle has severely decreased function. Normal left ventricular size. There is moderately increased left ventricular hypertrophy. Severe global hypokinesis.  2. Left ventricular diastolic function could not be evaluated pattern of LV diastolic filling.  3. Global right ventricle has mildly reduced systolic function.The right ventricular  size is normal. No increase in right ventricular wall thickness.  4. Left atrial size was severely dilated.  5. Right atrial size was normal.  6. The mitral valve is abnormal. Mild mitral valve regurgitation.  7. The tricuspid valve is grossly normal. Tricuspid valve regurgitation is trivial.  8. The aortic valve is tricuspid Aortic valve regurgitation was not visualized by color flow Doppler.  9. The pulmonic valve was grossly normal. Pulmonic valve regurgitation is not visualized by color flow Doppler. 10. Normal pulmonary artery systolic pressure. 11. The inferior vena cava is normal in size with <50% respiratory variability, suggesting right atrial pressure of 8 mmHg. (on vent) 12. The interatrial septum was not well visualized.   Repeat Transthoracic Echocardiogram - pending   Bilateral Carotid Dopplers  00/00/2020 Pending   ECG - atrial fibrillation - VR - 102 BPM. (See cardiology reading for complete details)   EEG  08/03/2019 IMPRESSION: This study issuggestive of severe diffuse encephalopathy, non specific to etiology.No seizures or epileptiform discharges were seen throughout the recording. EEG 08/18/2028 IMPRESSION: This study is suggestive of mild to moderate diffuse encephalopathy, non specific to etiology.  No seizures or epileptiform discharges were seen throughout the recording. One episode of  left hand shaking/tremor was noted without EEG change and was therefore likely non epileptic.   PHYSICAL EXAM Blood pressure 117/60, pulse (!) 104, temperature 99.4 F (37.4 C), temperature source Oral, resp. rate (!) 24, height 5\' 6"  (1.676 m), weight 75.4 kg, SpO2 95 %.  Exam: NAD, pleasant                  Speech:    Not aphasic, dysarthria Cognition:    The patient is oriented to person only, follows simple commands      Cranial Nerves:    The pupils are equal, round, and reactive to light. Cannot assess sensation. The face is symmetric. The palate elevates in  the midline. Hearing intact to voice. Voice is normal. Shoulder shrug is normal. The tongue has normal motion without fasciculations.   Motor Observation:    Unilateral tremor noted.    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 female with history of persistent afib ( eliquis), HTN, CKD, CHF, and Cardiomyopathy who presented to [facility] after cardiac arrest. Neurology consulted for continued AMS.. She did not receive IV t-PA due to late presentation (>4.5 hours from time of onset).   Stroke:  Several scattered small acute infarcts in the white matter of the superior frontal lobes, left occipital lobe - embolic - afib vs watershed due to cardiac arrest   Code Stroke CT Head - not ordered  CT head  - no acute findings.  MRI head - Several scattered small acute infarcts in the white matter of the superior frontal lobes, left occipital lobe. No associated hemorrhage or mass effect. Several small subacute or perhaps chronic infarcts in the bilateral cerebellum.  MRA head - not ordered  CTA H&N - not ordered  CT Perfusion - not ordered  Carotid Doppler - pending  2D Echo - 08/03/19 - EF 20 - 25%. No cardiac source of emboli identified.   2D Echo - pending  Lacey Jensen Virus 2 - negative  LDL - 50  HgbA1c - 5.9  UDS - not ordered  VTE prophylaxis - IV Heparin Diet  Diet Order            Diet NPO time specified  Diet effective now              Eliquis (apixaban) daily prior to admission, now on heparin IV  Patient counseled to be compliant with her antithrombotic medications  Ongoing aggressive stroke risk factor management  Therapy recommendations:  pending  Disposition:  Pending  Hypertension  Home BP meds: norvasc ; metoprolol  Current BP meds: none   Stable - mildly low at  times . Permissive hypertension (BP parameters per cardiology) but gradually normalize in 5-7 days . Long-term BP goal normotensive  Hyperlipidemia  Home Lipid lowering medication: Lipitor 20 mg daily  LDL 50, goal < 70  Current lipid lowering medication: Lipitor 20 mg daily   Continue statin at discharge  Other Stroke Risk Factors  Advanced age  Previous ETOH use  Hx stroke/TIA - by imaging  Coronary artery disease  Congestive Heart Failure  Atrial fibrillation  Cardiomyopathy  Other Active Problems  CKD - solitary kidney - creatinine - 3.84  Palliative care following  Leukocytosis - 18 (afebrile / temp - 99.4 )  Anemia - Hb -  7.3  Hyperkalemia - 6.0    Hospital day # 73  Personally examined patient and images, and have participated in and made any corrections needed to history, physical, neuro exam,assessment and plan as stated above.  I have personally obtained the history, evaluated lab date, reviewed imaging studies and agree with radiology interpretations.    Sarina Ill, MD Stroke Neurology   A total of 25 minutes was spent for the care of this patient, spent on counseling patient and family on different diagnostic and therapeutic options, counseling and coordination of care, riskd ans benefits of management, compliance, or risk factor reduction and education.   To contact Stroke Continuity provider, please refer to [email or web address] After hours, contact General Neurology

## 2019-08-20 NOTE — Significant Event (Signed)
Rapid Response Event Note  Overview: MEWS - RED   RR has been elevated since yesterday, I saw the patient yesterday. HR is elevated now, patient did not get AMIO this morning because patient was in HD. MD made aware. Patient is more awake today than yesterday, she seems better today - more responsive than yesterday.   NO RRT INTERVENTIONS   Jaclyn Day, [place]

## 2019-08-20 NOTE — Progress Notes (Signed)
I updated Dr. [place] Crane that patient continued to be tachycardic and tachypneic. Vital signs q15 minutes and are documented. Patient is not in any distress. She denies any pain. New orders for EKG. Will follow through and continue to monitor vital signs accordingly

## 2019-08-20 NOTE — Progress Notes (Signed)
  Speech Language Pathology Treatment: Dysphagia  Patient Details Name: Jaclyn Day MRN: [id number] DOB: 03-01-1946 Today's Date: 08/20/2019 Time: [id number] SLP Time Calculation (min) (ACUTE ONLY): 8 min  Assessment / Plan / Recommendation Clinical Impression  Pt was seen for skilled ST targeting dysphagia in the setting of acute infarcts.  Pt was observed with minimal trials of ice chips and thin liquid via tsp.  Pt exhibited decreased attention to bolus and no labial seal was attempted despite max verbal and tactile cues.  Additionally, no lingual manipulation, attempt at AP transport, or swallow initiation were observed with any trials.  Trials were suctioned from the pt's oral cavity secondary to aspiration risk.  Recommend continuation of NPO with alternative means of nutrition and frequent oral care.  SLP will continue to f/u per POC.     HPI HPI: Pt is a 73 yo F admitted for witnessed out of hospital cardiac arrest, unclear etiology, with field ROSC, resultant respiratory failure and encephalopathy s/p TTM; ETT 10/22-11/1. CRRT initiated 10/30. PMH: unilateral congenital absence of kidney, Afib, HTN, CKD, CHF, anemia, mood disorder.  MRI on 11/7 revealed "Several scattered small acute infarcts in the white matter of the superior frontal lobes, left occipital lobe. No associated hemorrhage or mass effect. 2. Several small subacute or perhaps chronic infarcts in the bilateral cerebellum."      SLP Plan  Continue with current plan of care  Patient needs continued Speech Lanaguage Pathology Services    Recommendations  Diet recommendations: NPO Medication Administration: Via alternative means                Oral Care Recommendations: Oral care QID;Staff/trained caregiver to provide oral care Follow up Recommendations: Skilled Nursing facility SLP Visit Diagnosis: Dysphagia, unspecified (R13.10) Plan: Continue with current plan of care       GO               Colin Mulders  M.S., [place] Office: [phone]  [place] 08/20/2019, 8:21 AM

## 2019-08-20 NOTE — Progress Notes (Signed)
Unable to see patient. She was at HD.
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
## 2019-08-20 NOTE — Plan of Care (Signed)
  Problem: Clinical Measurements: Goal: Will remain free from infection Outcome: Progressing Goal: Diagnostic test results will improve Outcome: Progressing   Problem: Nutrition: Goal: Adequate nutrition will be maintained Outcome: Progressing   Problem: Coping: Goal: Level of anxiety will decrease Outcome: Progressing   Elesa Hacker, RN

## 2019-08-21 ENCOUNTER — Inpatient Hospital Stay (HOSPITAL_COMMUNITY): Payer: 59

## 2019-08-21 ENCOUNTER — Other Ambulatory Visit (HOSPITAL_COMMUNITY): Payer: 59

## 2019-08-21 DIAGNOSIS — R7881 Bacteremia: Secondary | ICD-10-CM | POA: Diagnosis not present

## 2019-08-21 DIAGNOSIS — I361 Nonrheumatic tricuspid (valve) insufficiency: Secondary | ICD-10-CM | POA: Diagnosis not present

## 2019-08-21 DIAGNOSIS — Z7189 Other specified counseling: Secondary | ICD-10-CM | POA: Diagnosis not present

## 2019-08-21 DIAGNOSIS — I5043 Acute on chronic combined systolic (congestive) and diastolic (congestive) heart failure: Secondary | ICD-10-CM | POA: Diagnosis not present

## 2019-08-21 DIAGNOSIS — J9601 Acute respiratory failure with hypoxia: Secondary | ICD-10-CM | POA: Diagnosis not present

## 2019-08-21 DIAGNOSIS — B9561 Methicillin susceptible Staphylococcus aureus infection as the cause of diseases classified elsewhere: Secondary | ICD-10-CM | POA: Diagnosis not present

## 2019-08-21 DIAGNOSIS — I469 Cardiac arrest, cause unspecified: Secondary | ICD-10-CM | POA: Diagnosis not present

## 2019-08-21 DIAGNOSIS — E440 Moderate protein-calorie malnutrition: Secondary | ICD-10-CM

## 2019-08-21 LAB — HEPARIN LEVEL (UNFRACTIONATED)
Heparin Unfractionated: 0.38 IU/mL (ref 0.30–0.70)
Heparin Unfractionated: 0.39 IU/mL (ref 0.30–0.70)

## 2019-08-21 LAB — BASIC METABOLIC PANEL
Anion gap: 10 (ref 5–15)
BUN: 37 mg/dL — ABNORMAL HIGH (ref 8–23)
CO2: 28 mmol/L (ref 22–32)
Calcium: 8.8 mg/dL — ABNORMAL LOW (ref 8.9–10.3)
Chloride: 99 mmol/L (ref 98–111)
Creatinine, Ser: 2.62 mg/dL — ABNORMAL HIGH (ref 0.44–1.00)
GFR calc Af Amer: 20 mL/min — ABNORMAL LOW (ref >60)
GFR calc non Af Amer: 18 mL/min — ABNORMAL LOW (ref >60)
Glucose, Bld: 115 mg/dL — ABNORMAL HIGH (ref 70–99)
Potassium: 4.9 mmol/L (ref 3.5–5.1)
Sodium: 137 mmol/L (ref 135–145)

## 2019-08-21 LAB — ECHOCARDIOGRAM COMPLETE
Height: 66 in
Weight: 2634.94 oz

## 2019-08-21 LAB — GLUCOSE, CAPILLARY
Glucose-Capillary: 106 mg/dL — ABNORMAL HIGH (ref 70–99)
Glucose-Capillary: 110 mg/dL — ABNORMAL HIGH (ref 70–99)
Glucose-Capillary: 117 mg/dL — ABNORMAL HIGH (ref 70–99)
Glucose-Capillary: 125 mg/dL — ABNORMAL HIGH (ref 70–99)
Glucose-Capillary: 134 mg/dL — ABNORMAL HIGH (ref 70–99)
Glucose-Capillary: 94 mg/dL (ref 70–99)

## 2019-08-21 LAB — PREPARE RBC (CROSSMATCH)

## 2019-08-21 LAB — IRON AND TIBC
Iron: 12 ug/dL — ABNORMAL LOW (ref 28–170)
Saturation Ratios: 6 % — ABNORMAL LOW (ref 10.4–31.8)
TIBC: 189 ug/dL — ABNORMAL LOW (ref 250–450)
UIBC: 177 ug/dL

## 2019-08-21 LAB — CBC
HCT: 23.4 % — ABNORMAL LOW (ref 36.0–46.0)
Hemoglobin: 7 g/dL — ABNORMAL LOW (ref 12.0–15.0)
MCH: 28.6 pg (ref 26.0–34.0)
MCHC: 29.9 g/dL — ABNORMAL LOW (ref 30.0–36.0)
MCV: 95.5 fL (ref 80.0–100.0)
Platelets: 429 10*3/uL — ABNORMAL HIGH (ref 150–400)
RBC: 2.45 MIL/uL — ABNORMAL LOW (ref 3.87–5.11)
RDW: 17.4 % — ABNORMAL HIGH (ref 11.5–15.5)
WBC: 16.6 10*3/uL — ABNORMAL HIGH (ref 4.0–10.5)
nRBC: 0 % (ref 0.0–0.2)

## 2019-08-21 LAB — FERRITIN: Ferritin: 320 ng/mL — ABNORMAL HIGH (ref 11–307)

## 2019-08-21 LAB — PHOSPHORUS: Phosphorus: 4.1 mg/dL (ref 2.5–4.6)

## 2019-08-21 LAB — ABO/RH: ABO/RH(D): O POS

## 2019-08-21 LAB — VITAMIN B12: Vitamin B-12: 393 pg/mL (ref 180–914)

## 2019-08-21 MED ORDER — FUROSEMIDE 10 MG/ML IJ SOLN
20.0000 mg | Freq: Once | INTRAMUSCULAR | Status: AC
  Administered 2019-08-21: 20:00:00 20 mg via INTRAVENOUS
  Filled 2019-08-21: qty 2

## 2019-08-21 MED ORDER — SODIUM CHLORIDE 0.9% IV SOLUTION
Freq: Once | INTRAVENOUS | Status: AC
  Administered 2019-08-30: 11:00:00 via INTRAVENOUS

## 2019-08-21 MED ORDER — ACETAMINOPHEN 160 MG/5ML PO SOLN
650.0000 mg | Freq: Four times a day (QID) | ORAL | Status: DC | PRN
  Administered 2019-08-21 – 2019-09-01 (×5): 650 mg
  Filled 2019-08-21 (×4): qty 20.3

## 2019-08-21 MED ORDER — LEVALBUTEROL HCL 0.63 MG/3ML IN NEBU
0.6300 mg | INHALATION_SOLUTION | Freq: Four times a day (QID) | RESPIRATORY_TRACT | Status: DC | PRN
  Administered 2019-08-21: 22:00:00 0.63 mg via RESPIRATORY_TRACT
  Filled 2019-08-21: qty 3
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
## 2019-08-21 NOTE — Progress Notes (Signed)
Bell for Heparin  Indication: atrial fibrillation  Allergies  Allergen Reactions  . Codeine Other (See Comments)    Reaction not recalled by family- was told to never take this    Patient Measurements: Height: 5\' 6"  (167.6 cm) Weight: 166 lb 3.6 oz (75.4 kg) IBW/kg (Calculated) : 59.3 Heparin Dosing Weight: 78 kg  Vital Signs: Temp: 99.8 F (37.7 C) (11/08 2352) Temp Source: Axillary (11/08 2352) BP: 113/72 (11/08 2352) Pulse Rate: 101 (11/08 1905)  Labs: Recent Labs    08/18/19 1931 08/19/19 0420  08/20/19 0523 08/20/19 1427 08/20/19 2340  HGB 7.5* 7.7*  --  7.3*  --   --   HCT 24.3* 25.9*  --  24.3*  --   --   PLT 393 409*  --  424*  --   --   HEPARINUNFRC  --  1.28*   < > 0.47 0.52 0.38  CREATININE 3.99* 3.46*  --  3.84*  --   --    < > = values in this interval not displayed.    Estimated Creatinine Clearance: 13.7 mL/min (A) (by C-G formula based on SCr of 3.84 mg/dL (H)).  Assessment: Pt is a 73 y/o female admitted s/p out of hospital cardiac arrest. ROSC was achieved and targeted temperature management was initiated. This admission has been complicated by continued encephalopathy, AKI on CKD which has advanced to dialysis, and hypotension.    Pt has a history of A-fib (taking eliquis PTA), diastolic heart failure, HTN, HDL, IDDM, CKD. Pt continues on heparin for Afib.   MRI on 11/7 found several small acute infarcts though no hemorrhage. Heparin goal was adjusted and heparin rate decreased. Heparin level of 0.47 this morning was therapeutic but at the higher end of the range (goal 0.3 - 0.5) on heparin 1000 units/hr and drawn 1 hour early. STAT heparin level recheck was ordered and was unable to be obtained until the patient returned from hemodialysis. Heparin level this afternoon of 0.52 on heparin 1000 units/hr was supratherapeutic. Hgb 7.3. Plt wnl. No reported bleeding.   11/9 AM update:  Heparin level therapeutic  x 1 after rate decrease  Goal of Therapy:  Heparin level 0.3-0.5 units/ml Monitor platelets by anticoagulation protocol: Yes   Plan:  Cont heparin at 950 units/hr Confirmatory heparin level with AM labs  Narda Bonds, PharmD, [place] Pharmacist Phone: [phone]
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
## 2019-08-21 NOTE — Progress Notes (Signed)
[place] for Heparin  Indication: atrial fibrillation  Allergies  Allergen Reactions  . Codeine Other (See Comments)    Reaction not recalled by family- was told to never take this    Patient Measurements: Height: 5\' 6"  (167.6 cm) Weight: 164 lb 10.9 oz (74.7 kg) IBW/kg (Calculated) : 59.3 Heparin Dosing Weight: 78 kg  Vital Signs: Temp: 98.6 F (37 C) (11/09 0458) Temp Source: Axillary (11/09 0458) BP: 109/53 (11/09 0458) Pulse Rate: 96 (11/09 0930)  Labs: Recent Labs    08/18/19 1931 08/19/19 0420  08/20/19 0523 08/20/19 1427 08/20/19 2340  HGB 7.5* 7.7*  --  7.3*  --   --   HCT 24.3* 25.9*  --  24.3*  --   --   PLT 393 409*  --  424*  --   --   HEPARINUNFRC  --  1.28*   < > 0.47 0.52 0.38  CREATININE 3.99* 3.46*  --  3.84*  --   --    < > = values in this interval not displayed.    Estimated Creatinine Clearance: 13.7 mL/min (A) (by C-G formula based on SCr of 3.84 mg/dL (H)).  Assessment: Pt is a 73 y/o female admitted s/p out of hospital cardiac arrest. ROSC was achieved and targeted temperature management was initiated. This admission has been complicated by continued encephalopathy, AKI on CKD which has advanced to dialysis, and hypotension.    MRI on 11/7 found several small acute infarcts though no hemorrhage.. Pt continues on heparin for Afib. Plans to change back to apixaban when no further procedures planned -heparin level at goal  -Hg = 7.3 (low/stable)    Goal of Therapy:  Heparin level 0.3-0.5 units/ml Monitor platelets by anticoagulation protocol: Yes   Plan:  Continue heparin at 950 units/hr Monitor heparin level, CBC  Hildred Laser, PharmD Clinical Pharmacist **Pharmacist phone directory can now be found on [email or web address] ([email or web address]).  Listed under [place].

## 2019-08-21 NOTE — Progress Notes (Signed)
Patient stated having trouble breathing. Upon assessment RR 20-30s, 97% spo2 on 2L, clear lung sounds, and HR 100-140s. Paged [facility], got orders for xopenex. Patient had received lasix around 2000.   Reassessed, patient resting, eye closed. HR 100s, RR 26, BP 122/63. Will reassess pt closely

## 2019-08-21 NOTE — Progress Notes (Signed)
Pt has had intermittent tachcardia, tachpnea, low grade temp over 24 hours, now course BS and congested cough noted.  Also ? Transient  Left facial droop, eyes gazed at ceiling, patient redirects eyes with direction.  Pt alert to self, place and year.  Dysarthria unchanged.  Bodenheimer, NP notified with findings.  Awaiting further orders.

## 2019-08-21 NOTE — TOC Progression Note (Signed)
Transition of Care [facility]) - Progression Note    Patient Details  Name: SHALIMAR CHIARAMONTE MRN: [id number] Date of Birth: 1946/09/08  Transition [facility]) CM/SW Contact  Graves-Bigelow, Ocie Cornfield, RN Phone Number: 08/21/2019, 11:35 AM  Clinical Narrative:   Cortrak in place-speech will need to continue to follow. If plan continues to be SNF- patient will benefit from peg tube. Palliative continues to follow the patient. Neuro to consult. CM continues to follow for transition of care needs.    Expected Discharge Plan: [place] Barriers to Discharge: (Pt had cortrak placed 08-15-19)  Expected Discharge Plan and Services Expected Discharge Plan: [place] In-house Referral: NA Discharge Planning Services: CM Consult Post Acute Care Choice: [place] Living arrangements for the past 2 months: Single Family Home                   Social Determinants of Health (SDOH) Interventions    Readmission Risk Interventions Readmission Risk Prevention Plan 08/08/2019  Transportation Screening Complete  PCP or Specialist Appt within 3-5 Days Complete  HRI or Home Care Consult Complete  Medication Review (RN Care Manager) Complete  Some recent data might be hidden

## 2019-08-21 NOTE — Progress Notes (Signed)
  Echocardiogram 2D Echocardiogram has been performed.  Jaclyn Day 08/21/2019, 12:17 PM

## 2019-08-21 NOTE — Progress Notes (Signed)
Jaclyn Day NEPHROLOGY PROGRESS NOTE  Assessment/ Plan: Pt is a 73 y.o. yo female with out of hospital cardiac arrest CPR for 10 minutes, history of CKD stage IV, CHF.  She was a started dialysis on 10/25 via right IJ catheter.  Urine culture with E. coli, CRRT initiated on 10/30/120.  #AKI on CKD stage IV: CKD due to hypertension.  AKI likely ATN in the setting of shock, cardiac arrest and bacteremia.  She was on CRRT since 10/30-11/1.   -Kidney ultrasound showed solitary right kidney with cortical thinning and cysts. -Patient received dialysis yesterday for hyperkalemia, labs improved today.  Discussed with boyfriend, who identifies as healthcare power of attorney, that she is a marginal candidate at the current time for long-term outpatient dialysis given her severe comorbidities; without significant further functional improvement.  He was receptive to this.  Palliative care to see patient.  Currently using temp HD catheter.  Reassess status and dialysis needs tomorrow.  #Hyperkalemia: Stable after dialysis yesterday  #Acute encephalopathy: Appears encephalopathic today.  11/7 MRI with scattered small acute infarcts and chronic infarcts noted.  #Cardiac arrest, acute on chronic systolic CHF: EF [id number] by echo, cardiology is following.  EF improved in TEE.  #MSSA bacteremia: Continue cefazolin. End date six weeks from last negative culture 08/07/19 - 09/18/19.  #Hypotension: Currently off pressor.  Blood pressure is better.  Continue midodrine.  #Acute respiratory failure with hypoxia: HD today with UF.  On 2 L oxygen.  #Paroxysmal atrial fibrillation: Cardiology following.  On amiodarone and heparin drip.  Subjective: HD yesterday.  Today stable, boyfriend/HC POA at bedside, updated.   Objective Vital signs in last 24 hours: Vitals:   08/21/19 0458 08/21/19 0500 08/21/19 0930 08/21/19 1003  BP: (!) 109/53   (!) 151/82  Pulse:   96 90  Resp: (!) 22  (!) 21 (!) 29  Temp:  98.6 F (37 C)     TempSrc: Axillary     SpO2: 95%  99% (!) 88%  Weight:  74.7 kg    Height:       Weight change: 0 kg  Intake/Output Summary (Last 24 hours) at 08/21/2019 1252 Last data filed at 08/21/2019 0500 Gross per 24 hour  Intake 119.5 ml  Output 661 ml  Net -541.5 ml       Labs: Basic Metabolic Panel: Recent Labs  Lab 08/19/19 0420 08/20/19 0523 08/21/19 1040  NA 134* 135 137  K 5.4* 6.0* 4.9  CL 97* 98 99  CO2 25 24 28   GLUCOSE 100* 135* 115*  BUN 66* 79* 37*  CREATININE 3.46* 3.84* 2.62*  CALCIUM 8.6* 8.6* 8.8*  PHOS 5.2* 5.1* 4.1   Liver Function Tests: Recent Labs  Lab 08/15/19 1130 08/18/19 1931  AST 16 10*  ALT <5 <5  ALKPHOS 97 91  BILITOT 0.7 0.6  PROT 5.5* 5.1*  ALBUMIN 1.9* 1.7*   No results for input(s): LIPASE, AMYLASE in the last 168 hours. Recent Labs  Lab 08/20/19 1427  AMMONIA <9*   CBC: Recent Labs  Lab 08/18/19 0249 08/18/19 1931 08/19/19 0420 08/20/19 0523 08/21/19 1040  WBC 18.7* 18.2* 17.6* 18.0* 16.6*  HGB 7.5* 7.5* 7.7* 7.3* 7.0*  HCT 24.0* 24.3* 25.9* 24.3* 23.4*  MCV 93.0 93.1 94.9 94.6 95.5  PLT 384 393 409* 424* 429*   Cardiac Enzymes: No results for input(s): CKTOTAL, CKMB, CKMBINDEX, TROPONINI in the last 168 hours. CBG: Recent Labs  Lab 08/20/19 2005 08/20/19 2352 08/21/19 0454 08/21/19 0758 08/21/19  1138  GLUCAP 125* 125* 106* 117* 110*    Iron Studies: No results for input(s): IRON, TIBC, TRANSFERRIN, FERRITIN in the last 72 hours. Studies/Results: Ct Head Wo Contrast  Result Date: 08/21/2019 CLINICAL DATA:  Left-sided facial droop. EXAM: CT HEAD WITHOUT CONTRAST TECHNIQUE: Contiguous axial images were obtained from the base of the skull through the vertex without intravenous contrast. COMPARISON:  August 18, 2019. FINDINGS: Brain: Mild chronic ischemic white matter disease is noted. No mass effect or midline shift is noted. Ventricular size is within normal limits. There is no evidence of  mass lesion, hemorrhage or acute infarction. Vascular: No hyperdense vessel or unexpected calcification. Skull: Normal. Negative for fracture or focal lesion. Sinuses/Orbits: Bilateral ethmoid and sphenoid sinusitis is noted. Other: None. IMPRESSION: Mild chronic ischemic white matter disease. No acute intracranial abnormality seen. Electronically Signed   By: Marijo Conception M.D.   On: 08/21/2019 07:40   [name] Contrast  Result Date: 08/19/2019 CLINICAL DATA:  73 year old female with altered mental status. Cardiac arrest, query anoxic injury. EXAM: MRI HEAD WITHOUT CONTRAST TECHNIQUE: Multiplanar, multiecho pulse sequences of the brain and surrounding structures were obtained without intravenous contrast. COMPARISON:  Head CTs 08/18/2019 and earlier. FINDINGS: Brain: There are multiple small scattered cerebral white matter foci of abnormal diffusion in both superior frontal lobes (series 5, image 88). These appear mildly restricted on ADC. The largest is in the left posterior centrum semiovale (series 5, image 87 and series 7, image 55) which is restricted. There is also a small focus in the left periatrial white matter. No other convincing restricted diffusion. There appear to be small subacute or chronic infarcts in the bilateral cerebellum (series 15, image 7). No definite anoxic injury in the bilateral deep gray nuclei. No acute intracranial hemorrhage or mass effect. No midline shift, mass effect, evidence of mass lesion, ventriculomegaly, extra-axial collection or acute intracranial hemorrhage. Cervicomedullary junction and pituitary are within normal limits. No chronic cortical encephalomalacia identified. Vascular: Major intracranial vascular flow voids are preserved. Skull and upper cervical spine: Negative visible cervical spine. Visualized bone marrow signal is within normal limits. Sinuses/Orbits: No acute orbit soft tissue findings. Mild to moderate paranasal sinus mucosal thickening. Trace  left mastoid fluid. Other: Small volume retained secretions in the nasopharynx. Right mastoids are clear. Visible internal auditory structures appear normal. IMPRESSION: 1. Several scattered small acute infarcts in the white matter of the superior frontal lobes, left occipital lobe. No associated hemorrhage or mass effect. 2. Several small subacute or perhaps chronic infarcts in the bilateral cerebellum. 3. No definite anoxic injury to the deep gray nuclei, and no other acute intracranial abnormality identified. Electronically Signed   By: Genevie Ann M.D.   On: 08/19/2019 14:18   Dg Chest Port 1 View  Result Date: 08/21/2019 CLINICAL DATA:  Shortness of breath. EXAM: PORTABLE CHEST 1 VIEW COMPARISON:  One-view chest x-ray 08/14/2019 FINDINGS: The heart is enlarged. Atherosclerotic calcifications are present at the aortic arch. Feeding tube courses off the inferior border the film. A right IJ line terminates in the mid SVC. Increased pulmonary vascular congestion is present. Progressive bilateral pleural effusions and basilar airspace disease is noted. Upper lung fields are clear. IMPRESSION: 1. Cardiomegaly with increasing pulmonary vascular congestion and bilateral effusions consistent with congestive heart failure. 2. Bibasilar airspace disease likely reflects atelectasis. Infection is not excluded. 3. Stable right IJ catheter. Electronically Signed   By: San Morelle M.D.   On: 08/21/2019 06:33    Medications: Infusions: .  sodium chloride 10 mL (08/20/19 2025)  . sodium chloride    . sodium chloride    .  ceFAZolin (ANCEF) IV 1 g (08/20/19 2331)  . feeding supplement (OSMOLITE 1.5 CAL) 1,000 mL (08/20/19 2337)  . heparin Stopped (08/21/19 0659)  . valproate sodium 250 mg (08/21/19 1241)    Scheduled Medications: .  stroke: mapping our early stages of recovery book   Does not apply Once  . sodium chloride   Intravenous Once  . amiodarone  400 mg Per Tube Daily  . ARIPiprazole  10 mg Per  Tube BID  . atorvastatin  20 mg Per NG tube Daily  . chlorhexidine  15 mL Mouth Rinse BID  . Chlorhexidine Gluconate Cloth  6 each Topical Q0600  . feeding supplement (PRO-STAT SUGAR FREE 64)  30 mL Per Tube Daily  . insulin aspart  0-9 Units Subcutaneous Q4H  . mouth rinse  15 mL Mouth Rinse BID  . midodrine  10 mg Per Tube TID WC  . pantoprazole (PROTONIX) IV  40 mg Intravenous Q24H  . sodium chloride flush  10-40 mL Intracatheter Q12H  . Thrombi-Pad  1 each Topical Once    have reviewed scheduled and prn medications.  Physical Exam: General: Alert awake but remains encephalopathic, unchanged Heart:RRR, s1s2 nl, no rubs Lungs: Bibasal crackles, no increased work of breathing Abdomen:soft, Non-tender, non-distended Extremities: Trace edema Dialysis Access: Right IJ temporary catheter, site clean  Rexene Agent 08/21/2019,12:52 PM  LOS: 18 days  Pager: ID:[phone]

## 2019-08-21 NOTE — Progress Notes (Signed)
STROKE TEAM PROGRESS NOTE       INTERVAL HISTORY I personally reviewed history of presenting illness and imaging studies in PACS.  Patient's nurse is at the bedside.  She is drowsy but has been awake and interactive earlier today.  No witnessed seizure activity.  She was unable to pass swallow eval and has a feeding tube.    OBJECTIVE Vitals:   08/21/19 0500 08/21/19 0930 08/21/19 1003 08/21/19 1254  BP:   (!) 151/82 131/71  Pulse:  96 90 91  Resp:  (!) 21 (!) 29 (!) 22  Temp:    98.2 F (36.8 C)  TempSrc:    Axillary  SpO2:  99% (!) 88% 100%  Weight: 74.7 kg     Height:        CBC:  Recent Labs  Lab 08/20/19 0523 08/21/19 1040  WBC 18.0* 16.6*  HGB 7.3* 7.0*  HCT 24.3* 23.4*  MCV 94.6 95.5  PLT 424* 429*    Basic Metabolic Panel:  Recent Labs  Lab 08/17/19 0222 08/18/19 0249  08/20/19 0523 08/21/19 1040  NA 135 134*   < > 135 137  K 4.6 5.5*   < > 6.0* 4.9  CL 98 99   < > 98 99  CO2 24 24   < > 24 28  GLUCOSE 115* 119*   < > 135* 115*  BUN 47* 67*   < > 79* 37*  CREATININE 2.99* 3.70*   < > 3.84* 2.62*  CALCIUM 8.4* 8.3*   < > 8.6* 8.8*  MG 2.2 2.2  --   --   --   PHOS 4.6 4.8*   < > 5.1* 4.1   < > = values in this interval not displayed.    Lipid Panel:     Component Value Date/Time   CHOL 92 08/20/2019 0523   TRIG 79 08/20/2019 0523   HDL 26 (L) 08/20/2019 0523   CHOLHDL 3.5 08/20/2019 0523   VLDL 16 08/20/2019 0523   LDLCALC 50 08/20/2019 0523   HgbA1c:  Lab Results  Component Value Date   HGBA1C 5.9 (H) 08/20/2019   Urine Drug Screen: No results found for: LABOPIA, COCAINSCRNUR, LABBENZ, AMPHETMU, THCU, LABBARB  Alcohol Level No results found for: [facility]  IMAGING   Ct Head Wo Contrast  Result Date: 08/21/2019 CLINICAL DATA:  Left-sided facial droop. EXAM: CT HEAD WITHOUT CONTRAST TECHNIQUE: Contiguous axial images were obtained from the base of the skull through the vertex without intravenous contrast. COMPARISON:  August 18, 2019.  FINDINGS: Brain: Mild chronic ischemic white matter disease is noted. No mass effect or midline shift is noted. Ventricular size is within normal limits. There is no evidence of mass lesion, hemorrhage or acute infarction. Vascular: No hyperdense vessel or unexpected calcification. Skull: Normal. Negative for fracture or focal lesion. Sinuses/Orbits: Bilateral ethmoid and sphenoid sinusitis is noted. Other: None. IMPRESSION: Mild chronic ischemic white matter disease. No acute intracranial abnormality seen. Electronically Signed   By: Marijo Conception M.D.   On: 08/21/2019 07:40   Dg Chest Port 1 View  Result Date: 08/21/2019 CLINICAL DATA:  Shortness of breath. EXAM: PORTABLE CHEST 1 VIEW COMPARISON:  One-view chest x-ray 08/14/2019 FINDINGS: The heart is enlarged. Atherosclerotic calcifications are present at the aortic arch. Feeding tube courses off the inferior border the film. A right IJ line terminates in the mid SVC. Increased pulmonary vascular congestion is present. Progressive bilateral pleural effusions and basilar airspace disease is noted. Upper lung fields  are clear. IMPRESSION: 1. Cardiomegaly with increasing pulmonary vascular congestion and bilateral effusions consistent with congestive heart failure. 2. Bibasilar airspace disease likely reflects atelectasis. Infection is not excluded. 3. Stable right IJ catheter. Electronically Signed   By: San Morelle M.D.   On: 08/21/2019 06:33   Transthoracic Echocardiogram  08/03/2019 IMPRESSIONS  1. Left ventricular ejection fraction, by visual estimation, is 20 to 25%. The left ventricle has severely decreased function. Normal left ventricular size. There is moderately increased left ventricular hypertrophy. Severe global hypokinesis.  2. Left ventricular diastolic function could not be evaluated pattern of LV diastolic filling.  3. Global right ventricle has mildly reduced systolic function.The right ventricular size is normal. No increase  in right ventricular wall thickness.  4. Left atrial size was severely dilated.  5. Right atrial size was normal.  6. The mitral valve is abnormal. Mild mitral valve regurgitation.  7. The tricuspid valve is grossly normal. Tricuspid valve regurgitation is trivial.  8. The aortic valve is tricuspid Aortic valve regurgitation was not visualized by color flow Doppler.  9. The pulmonic valve was grossly normal. Pulmonic valve regurgitation is not visualized by color flow Doppler. 10. Normal pulmonary artery systolic pressure. 11. The inferior vena cava is normal in size with <50% respiratory variability, suggesting right atrial pressure of 8 mmHg. (on vent) 12. The interatrial septum was not well visualized.   Repeat Transthoracic Echocardiogram - pending   Bilateral Carotid Dopplers  00/00/2020 Pending   ECG - atrial fibrillation - VR - 102 BPM. (See cardiology reading for complete details)   EEG  08/03/2019 IMPRESSION: This study issuggestive of severe diffuse encephalopathy, non specific to etiology.No seizures or epileptiform discharges were seen throughout the recording. EEG 08/18/2028 IMPRESSION: This study is suggestive of mild to moderate diffuse encephalopathy, non specific to etiology.  No seizures or epileptiform discharges were seen throughout the recording. One episode of left hand shaking/tremor was noted without EEG change and was therefore likely non epileptic.   PHYSICAL EXAM Blood pressure 131/71, pulse 91, temperature 98.2 F (36.8 C), temperature source Axillary, resp. rate (!) 22, height 5\' 6"  (1.676 m), weight 74.7 kg, SpO2 100 %. Elderly Caucasian lady not in distress. . Afebrile. Head is nontraumatic. Neck is supple without bruit.    Cardiac exam no murmur or gallop. Lungs are clear to auscultation. Distal pulses are well felt. Neurological Exam ; Patient is drowsy but can be aroused.  Follows only occasional midline commands and speaks a few words.  Eyes  are closed.  Left gaze preference with moved to the right till midline.  Pupils equal reactive.  Fundi not visualized.  Able to move all 4 extremities against gravity but will not cooperate for detailed muscle testing.  No focal weakness.  Deep tendon reflexes symmetric.  Plantars downgoing.  Gait not tested. ASSESSMENT/PLAN Jaclyn Day is a 73 y.o. female with history of persistent afib ( eliquis), HTN, CKD, CHF, and Cardiomyopathy who presented to [facility] after cardiac arrest. Neurology consulted for continued AMS.. She did not receive IV t-PA due to late presentation (>4.5 hours from time of onset).   Stroke:  Several scattered small acute infarcts in the white matter of the superior frontal lobes, left occipital lobe - embolic - afib vs watershed due to cardiac arrest   Code Stroke CT Head - not ordered  CT head  - no acute findings.  MRI head - Several scattered small acute infarcts in the white matter of the superior frontal  lobes, left occipital lobe. No associated hemorrhage or mass effect. Several small subacute or perhaps chronic infarcts in the bilateral cerebellum.  MRA head - not ordered  CTA H&N - not ordered  CT Perfusion - not ordered  Carotid Doppler - pending  2D Echo - 08/03/19 - EF 20 - 25%. No cardiac source of emboli identified.   2D Echo - pending  Lacey Jensen Virus 2 - negative  LDL - 50  HgbA1c - 5.9  UDS - not ordered  VTE prophylaxis - IV Heparin Diet  Diet Order            Diet NPO time specified  Diet effective now              Eliquis (apixaban) daily prior to admission, now on heparin IV  Patient counseled to be compliant with her antithrombotic medications  Ongoing aggressive stroke risk factor management  Therapy recommendations:  pending  Disposition:  Pending  Hypertension  Home BP meds: norvasc ; metoprolol  Current BP meds: none   Stable - mildly low at times . Permissive hypertension (BP parameters per  cardiology) but gradually normalize in 5-7 days . Long-term BP goal normotensive  Hyperlipidemia  Home Lipid lowering medication: Lipitor 20 mg daily  LDL 50, goal < 70  Current lipid lowering medication: Lipitor 20 mg daily   Continue statin at discharge  Other Stroke Risk Factors  Advanced age  Previous ETOH use  Hx stroke/TIA - by imaging  Coronary artery disease  Congestive Heart Failure  Atrial fibrillation  Cardiomyopathy  Other Active Problems  CKD - solitary kidney - creatinine - 3.84  Palliative care following  Leukocytosis - 18 (afebrile / temp - 99.4 )  Anemia - Hb - 7.3  Hyperkalemia - 6.0    Hospital day # 18 Continue IV heparin for now and subsequently changed to Eliquis when she is able to swallow.  Continue ongoing therapies.  She will likely need transfer to skilled nursing facility for rehabilitation when she is able to swallow.Await repeat echo and carotid dopplers.  Greater than 50% time during this 25-minute visit was spent on counseling and coordination of care about her strokes and discussion with care team and answering questions.  Discussed with Dr. Francia Greaves I Antony Contras, MD Medical Director [place] Pager: [phone] 08/21/2019 4:22 PM     Sarina Ill, MD Stroke Neurology   A total of 25 minutes was spent for the care of this patient, spent on counseling patient and family on different diagnostic and therapeutic options, counseling and coordination of care, riskd ans benefits of management, compliance, or risk factor reduction and education.   To contact Stroke Continuity provider, please refer to [email or web address] After hours, contact General Neurology

## 2019-08-21 NOTE — Progress Notes (Signed)
Renal Navigator asked by Nephrologist/Dr. Carolin Sicks to refer patient for OP HD treatment for AKI. Renal Navigator reviewed records and spoke with CM/B. Graves-Bigelow. PT is recommending SNF, which, per CM, patient's boyfriend is open to. CM states she is awaiting decision regarding PEG tube in order to refer to SNFs. Renal Navigator notes that patient lives in [place]. Renal Navigator will await SNF placement before making referral to OP HD clinic, as patient will need to be accepted to a SNF that takes HD patients and we will need to ensure transportation to HD. CM to update Renal Navigator as plans progress.  Alphonzo Cruise, [place] Renal Navigator [phone]

## 2019-08-21 NOTE — Progress Notes (Signed)
PROGRESS NOTE  Jaclyn Day [id number] DOB: 10/06/1946 DOA: 08/03/2019 PCP: Cyndi Bender, PA-C  HPI/Recap of past 24 hours: Patient is 73 year old with history of congenital unilateral kidney, stage IV chronic kidney disease with baseline creatinine about 2.2-2.8, hypertension, persistent A. fib on anticoagulation with Eliquis, chronic diastolic heart failure, history of cardioversion for A. fib admitted to ICU after witnessed cardiac arrest.  Patient from home. 10/22 Admitted post arrest, intubated, initiation of targeted temperature management 36 celsius.   ECHO: LVEF: 20-25%, global hypokinesis, RV midly reduced function with normal size, mild MR, normal pulmonary pressures. Lactate cleared 10/25 - staph identified in blood and vanc started. Still with poor Ur OP. On amio gtt. Off leveophed and neo On Heparin gtt. iHD w/ 1L off 10/26- following simple commands, placed on precedex for agitation, febrile- re-cultured and femoral CVL discontinued  10/28- converted to NSR, TEE  10/30 - CRRT for volume removal Blood culture 10/22 >> MSSA  1/4 COVID 10/22 >> Negative RVP 10/22 >> Negative Urine 10/22 - E colii 10/26 BCx 2 >> neg  Remains in very poor mentation.  08/21/19: Patient seen and examined at her bedside this morning.  She is lethargic.  CT head showed no evidence of acute intracranial hemorrhage or acute ischemia.  TTE completed: Results pending.   Assessment/Plan: Principal Problem:   MSSA bacteremia Active Problems:   Cardiac arrest ([place])   Pressure injury of skin   Acute respiratory failure with hypoxia (HCC)   AKI (acute kidney injury) (HCC)   Elevated troponin   Persistent atrial fibrillation (HCC)   Acute on chronic combined systolic and diastolic CHF (congestive heart failure) (HCC)   Acute bacterial endocarditis   Malnutrition of moderate degree   Advanced care planning/counseling discussion   Goals of care, counseling/discussion   Palliative care  by specialist   Closed fracture of multiple ribs   Acute hypoxic respiratory failure due to cardiac arrest and pulmonary edema/bilateral pleural effusions:  Presented after out of hospital cardiac arrest.   Was intubated and extubated to nasal cannula oxygen.   Continue to maintain O2 saturation greater than 92%.  Chest physiotherapy and incentive spirometry if patient can tolerate. She is not much participating.  Poor prognosis. Independently reviewed chest x-ray done on 08/21/2019 which showed cardiomegaly with increase in pulmonary vascularity and pulmonary infiltrate versus atelectasis right mid to right lower lobes.  Bilateral pleural effusions.  Persistent acute metabolic encephalopathy suspect multifactorial with recent multiple acute CVA: In the setting of cardiac arrest hypothermia, MSSA bacteremia, prolonged hospitalization, versus others.  CT head 08/16/2019 stable. Remains waxing and waning mental status. Patient had worsening mental status since 08/18/19 evening. ABG was stable.  CT head stable.   On valproic acid as longstanding medications.  Obtain ammonia level. EEG with no evidence of seizure activity. Palliative care team consulted and following. MRI done on 08/19/2019 showed several scattered small acute infarcts in the white matter of the superior frontal lobes, left occipital lobe.  No associated hemorrhage or mass-effect.  Also several small subacute or perhaps chronic infarcts in the bilateral cerebellum. Neurology following.  Appreciate recommendations. TTE completed, results pending. CT head without contrast on 08/21/2019 no sign of intracranial acute hemorrhage or acute ischemia.  Multiple acute/subacute CVA involving white matter of the superior frontal lobes and left occipital lobe Concern for septic emboli in the setting of MSSA bacteremia; may need TEE to rule out endocarditis.  TTE results are pending. No hemorrhage, on heparin drip, neurology following.  Okay  to  continue anticoagulation per neuro.  Continue to closely monitor with neuro checks.  Witnessed cardiac arrest in the setting of history of chronic atrial fibrillation and coronary artery disease: Patient treated with hypothermia protocol.  Followed by cardiology.  Developed intermittent rapid A. fib.  Blood pressures low to tolerate multiple medications.  Initially treated with Cardizem drip, needed Levophed fo frequent r hypotension and now weaned off. Currently on amiodarone 400 mg daily with prolonged taper and maintenance planned. Remains on heparin infusion, continue heparin infusion until no procedures planned.  Switch to Eliquis after final plans. Patient is a still in A. fib but rate is better controlled.  Acute on chronic systolic heart failure: TTE on 10/22 with EF 20%.  Repeat TEE with improved ejection fraction to 60%.  CAD evaluation pending renal improvement.  MSSA bacteremia with suspected aortic valve vegetation: Repeat cultures negative.  Seen by infectious disease.  Recommended cefazolin until 12/7. TTE pending Continue cefazolin  Acute blood loss anemia No sign of overt bleeding Hemoglobin 7.0 on 08/21/2019 Transfuse 1 unit PRBC Repeat CBC in the morning  AKI on CKD stage IV: Suspected AKI likely ATN in the setting of shock ,cardiac arrest and bacteremia.  Started on CRRT.  Now on intermittent hemodialysis.  Last dialysis 08/18/2019.  Kidney ultrasound with no obstruction.  Followed by nephrology.  Plan hemodialysis on 08/20/2019.  Diet: Currently on tube feeding via core track tube.  Continue.  Unable to have swallow evaluation, lethargic. Continue aspiration precautions  Physical debility/ambulatory dysfunction PT OT assessment recommended SNF CSW consulted for assistance with SNF placement.  DVT prophylaxis: Heparin drip. Code Status: Full code Family Communication:  Updated her sister bedside. Disposition Plan: Continue hospitalization pending improvement.  May  need a skilled nursing facility on discharge.    Consultants:   Nephrology  Cardiology  PCCM  Palliative care team.   Procedures:   CRRT  HD   Antimicrobials:  Rocephin 10/22 >> 10/25 Vanc (staph in blood ) 10/24  Cefazolin 10/25 >>    Objective: Vitals:   08/20/19 2352 08/21/19 0458 08/21/19 0500 08/21/19 0930  BP: 113/72 (!) 109/53    Pulse:    96  Resp:  (!) 22  (!) 21  Temp: 99.8 F (37.7 C) 98.6 F (37 C)    TempSrc: Axillary Axillary    SpO2:  95%  99%  Weight:   74.7 kg   Height:        Intake/Output Summary (Last 24 hours) at 08/21/2019 1237 Last data filed at 08/21/2019 0500 Gross per 24 hour  Intake 119.5 ml  Output 661 ml  Net -541.5 ml   Filed Weights   08/20/19 0800 08/20/19 1143 08/21/19 0500  Weight: 77 kg 75.4 kg 74.7 kg    Exam:   General: 73 y.o. year-old female well-developed well-nourished no acute distress.  Lethargic.    Cardiovascular: Regular rate and rhythm no rubs or gallops no JVD or thyromegaly noted.    Respiratory: Clear to auscultation with no wheezes or rales.  Poor inspiratory effort.  Abdomen: Soft nontender nondistended with bowel sounds present.    Musculoskeletal: Diffuse dependent edema all throughout.  Psychiatry: Unable to assess mood due to lethargy.   Data Reviewed: CBC: Recent Labs  Lab 08/18/19 0249 08/18/19 1931 08/19/19 0420 08/20/19 0523 08/21/19 1040  WBC 18.7* 18.2* 17.6* 18.0* 16.6*  HGB 7.5* 7.5* 7.7* 7.3* 7.0*  HCT 24.0* 24.3* 25.9* 24.3* 23.4*  MCV 93.0 93.1 94.9 94.6 95.5  PLT 384  393 409* 424* [id number]*   Basic Metabolic Panel: Recent Labs  Lab 08/15/19 1713  08/16/19 0500 08/16/19 1630 08/17/19 0222 08/18/19 0249 08/18/19 1931 08/19/19 0420 08/20/19 0523 08/21/19 1040  NA  --    < > 135  --  135 134* 133* 134* 135 137  K  --    < > 4.1  --  4.6 5.5* 5.9* 5.4* 6.0* 4.9  CL  --   --  99  --  98 99 98 97* 98 99  CO2  --   --  26  --  24 24 23 25 24 28   GLUCOSE  --    --  122*  --  115* 119* 151* 100* 135* 115*  BUN  --   --  25*  --  47* 67* 80* 66* 79* 37*  CREATININE  --   --  2.09*  --  2.99* 3.70* 3.99* 3.46* 3.84* 2.62*  CALCIUM  --   --  8.3*  --  8.4* 8.3* 8.4* 8.6* 8.6* 8.8*  MG 1.9  --  2.2 2.1 2.2 2.2  --   --   --   --   PHOS 3.0  --  4.8* 4.5 4.6 4.8*  --  5.2* 5.1* 4.1   < > = values in this interval not displayed.   GFR: Estimated Creatinine Clearance: 20.1 mL/min (A) (by C-G formula based on SCr of 2.62 mg/dL (H)). Liver Function Tests: Recent Labs  Lab 08/15/19 1130 08/18/19 1931  AST 16 10*  ALT <5 <5  ALKPHOS 97 91  BILITOT 0.7 0.6  PROT 5.5* 5.1*  ALBUMIN 1.9* 1.7*   No results for input(s): LIPASE, AMYLASE in the last 168 hours. Recent Labs  Lab 08/20/19 1427  AMMONIA <9*   Coagulation Profile: No results for input(s): INR, PROTIME in the last 168 hours. Cardiac Enzymes: No results for input(s): CKTOTAL, CKMB, CKMBINDEX, TROPONINI in the last 168 hours. BNP (last 3 results) No results for input(s): PROBNP in the last 8760 hours. HbA1C: Recent Labs    08/20/19 0523  HGBA1C 5.9*   CBG: Recent Labs  Lab 08/20/19 2005 08/20/19 2352 08/21/19 0454 08/21/19 0758 08/21/19 1138  GLUCAP 125* 125* 106* 117* 110*   Lipid Profile: Recent Labs    08/20/19 0523  CHOL 92  HDL 26*  LDLCALC 50  TRIG 79  CHOLHDL 3.5   Thyroid Function Tests: No results for input(s): TSH, T4TOTAL, FREET4, T3FREE, THYROIDAB in the last 72 hours. Anemia Panel: No results for input(s): VITAMINB12, FOLATE, FERRITIN, TIBC, IRON, RETICCTPCT in the last 72 hours. Urine analysis:    Component Value Date/Time   COLORURINE BROWN (A) 08/05/2019 2018   APPEARANCEUR TURBID (A) 08/05/2019 2018   LABSPEC RESULTS UNAVAILABLE DUE TO INTERFERING SUBSTANCE 08/05/2019 2018   PHURINE RESULTS UNAVAILABLE DUE TO INTERFERING SUBSTANCE 08/05/2019 2018   GLUCOSEU RESULTS UNAVAILABLE DUE TO INTERFERING SUBSTANCE (A) 08/05/2019 2018   HGBUR RESULTS  UNAVAILABLE DUE TO INTERFERING SUBSTANCE (A) 08/05/2019 2018   BILIRUBINUR RESULTS UNAVAILABLE DUE TO INTERFERING SUBSTANCE (A) 08/05/2019 2018   KETONESUR RESULTS UNAVAILABLE DUE TO INTERFERING SUBSTANCE (A) 08/05/2019 2018   PROTEINUR RESULTS UNAVAILABLE DUE TO INTERFERING SUBSTANCE (A) 08/05/2019 2018   UROBILINOGEN 0.2 05/07/2009 1520   NITRITE RESULTS UNAVAILABLE DUE TO INTERFERING SUBSTANCE (A) 08/05/2019 2018   LEUKOCYTESUR RESULTS UNAVAILABLE DUE TO INTERFERING SUBSTANCE (A) 08/05/2019 2018   Sepsis Labs: @LABRCNTIP (procalcitonin:4,lacticidven:4)  )No results found for this or any previous visit (from the past 240 hour(s)).  Studies: Ct Head Wo Contrast  Result Date: 08/21/2019 CLINICAL DATA:  Left-sided facial droop. EXAM: CT HEAD WITHOUT CONTRAST TECHNIQUE: Contiguous axial images were obtained from the base of the skull through the vertex without intravenous contrast. COMPARISON:  August 18, 2019. FINDINGS: Brain: Mild chronic ischemic white matter disease is noted. No mass effect or midline shift is noted. Ventricular size is within normal limits. There is no evidence of mass lesion, hemorrhage or acute infarction. Vascular: No hyperdense vessel or unexpected calcification. Skull: Normal. Negative for fracture or focal lesion. Sinuses/Orbits: Bilateral ethmoid and sphenoid sinusitis is noted. Other: None. IMPRESSION: Mild chronic ischemic white matter disease. No acute intracranial abnormality seen. Electronically Signed   By: Marijo Conception M.D.   On: 08/21/2019 07:40   Dg Chest Port 1 View  Result Date: 08/21/2019 CLINICAL DATA:  Shortness of breath. EXAM: PORTABLE CHEST 1 VIEW COMPARISON:  One-view chest x-ray 08/14/2019 FINDINGS: The heart is enlarged. Atherosclerotic calcifications are present at the aortic arch. Feeding tube courses off the inferior border the film. A right IJ line terminates in the mid SVC. Increased pulmonary vascular congestion is present. Progressive  bilateral pleural effusions and basilar airspace disease is noted. Upper lung fields are clear. IMPRESSION: 1. Cardiomegaly with increasing pulmonary vascular congestion and bilateral effusions consistent with congestive heart failure. 2. Bibasilar airspace disease likely reflects atelectasis. Infection is not excluded. 3. Stable right IJ catheter. Electronically Signed   By: San Morelle M.D.   On: 08/21/2019 06:33    Scheduled Meds:   stroke: mapping our early stages of recovery book   Does not apply Once   sodium chloride   Intravenous Once   amiodarone  400 mg Per Tube Daily   ARIPiprazole  10 mg Per Tube BID   atorvastatin  20 mg Per NG tube Daily   chlorhexidine  15 mL Mouth Rinse BID   Chlorhexidine Gluconate Cloth  6 each Topical Q0600   feeding supplement (PRO-STAT SUGAR FREE 64)  30 mL Per Tube Daily   insulin aspart  0-9 Units Subcutaneous Q4H   mouth rinse  15 mL Mouth Rinse BID   midodrine  10 mg Per Tube TID WC   pantoprazole (PROTONIX) IV  40 mg Intravenous Q24H   sodium chloride flush  10-40 mL Intracatheter Q12H   Thrombi-Pad  1 each Topical Once    Continuous Infusions:  sodium chloride 10 mL (08/20/19 2025)   sodium chloride     sodium chloride      ceFAZolin (ANCEF) IV 1 g (08/20/19 2331)   feeding supplement (OSMOLITE 1.5 CAL) 1,000 mL (08/20/19 2337)   heparin Stopped (08/21/19 0659)   valproate sodium 250 mg (08/21/19 0603)     LOS: 18 days     Kayleen Memos, MD [facility] Pager [phone]  If 7PM-7AM, please contact night-coverage [email or web address] Password [email or web address] 08/21/2019, 12:37 PM

## 2019-08-21 NOTE — Progress Notes (Addendum)
Paged Dr. [place] Crane to make aware pt's resp rate 27-32. Breath sounds becoming more coarse and increasing cough. Made aware mews turned red d/t resp rate. Pt awake and alert. Pt has waxed and waned today. Unable to open left eye and left sided droop at times as well as unable to move right arm at times. Pt states nothing hurts, she just wants to be left alone. Dr. [place] Crane stated to hold blood and would reassess tomorrow and give in dialysis if needed, cont to monitor pt for now. Had cxray this am without anything acute. She stated not to repeat at this time. Cont to monitor. Carroll Kinds RN

## 2019-08-21 NOTE — Progress Notes (Signed)
Called patient's partner of 27 years, Mr. Konrad Dolores, to give updates. He was mainly interested in finding out about her ability to swallow and speaking. Didn't really want to know more at this time. Will give more updates tomorrow.

## 2019-08-21 NOTE — Significant Event (Signed)
Rapid Response Event Note  MEWS 4 - Red  Reported by nurse, no acute changes, increased RR and HR are ongoing issues. RN notified MD   NO RRT INTERVENTIONS   Johonna Binette, [place]

## 2019-08-21 NOTE — Progress Notes (Signed)
  Speech Language Pathology Treatment: Dysphagia  Patient Details Name: Jaclyn Day MRN: [id number] DOB: 1946-01-17 Today's Date: 08/21/2019 Time: 1555-1610 SLP Time Calculation (min) (ACUTE ONLY): 15 min  Assessment / Plan / Recommendation Clinical Impression  Pt was encountered asleep in bed and she remained lethargic throughout this tx session, requiring max verbal and tactile cues to open her eyes.  Pt was observed to have dried secretions in her oral cavity upon arrival, therefore oral care was completed with swab and suction.  Pt exhibited an immediate cough x1 following oral care, suspect secondary to spillage of liquid from swab to the pharynx.  She was then seen with minimal trials of ice chips and 1/4 tsp of thin liquid.  She exhibited poor bolus acceptance c/b decreased labial closure around the spoon when boluses were presented.  She passively accepted boluses to oral cavity; however she presented with minimal, repetitive lingual movements without attempts at AP transport.  Pt exhibited a suspected delayed swallow initiation x1 with thin liquid trial followed by a delayed, prolonged cough.  No further po trials were attempted on this date.  Recommend continuation of NPO with alternative means of nutrition and frequent oral care.  SLP relayed all recommendations to RN.  SLP will continue to f/u for dysphagia and cognitive-linguistic treatment per POC.     HPI HPI: Pt is a 73 yo F admitted for witnessed out of hospital cardiac arrest, unclear etiology, with field ROSC, resultant respiratory failure and encephalopathy s/p TTM; ETT 10/22-11/1. CRRT initiated 10/30. PMH: unilateral congenital absence of kidney, Afib, HTN, CKD, CHF, anemia, mood disorder.  MRI on 11/7 revealed "Several scattered small acute infarcts in the white matter of the superior frontal lobes, left occipital lobe. No associated hemorrhage or mass effect. 2. Several small subacute or perhaps chronic infarcts in the  bilateral cerebellum."      SLP Plan  Continue with current plan of care       Recommendations  Diet recommendations: NPO Medication Administration: Via alternative means                Oral Care Recommendations: Oral care QID;Staff/trained caregiver to provide oral care Follow up Recommendations: Skilled Nursing facility;24 hour supervision/assistance SLP Visit Diagnosis: Dysphagia, unspecified (R13.10) Plan: Continue with current plan of care       [place]., M.S., [place] Office: [phone]  [place] 08/21/2019, 4:19 PM

## 2019-08-21 NOTE — Significant Event (Addendum)
Rapid Response Event Note  Overview: Follow Up - Neurologic   Initial Focused Assessment: I came by to see the patient, per nursing staff, around 6:30 they noticed that patient was not moving her RUE as much. HEAD CT was done at 0722 and it was negative for acute intracranial process. Patient was sleeping, arouse to voice, appears quite lethargic once I woke her up. She was able to move BLE to command (wiggles toes, briefly move legs to commands) but she was not able to move the RUE. Localizes to pain in RUE but not able to squeeze my hand or push against my hand. VSS. Pupils reactive. RT side has been weaker than the left. She was much more awake yesterday when I saw her around 1245. Heparin drip was infusing overnight, stopped briefly this morning when patient went for Head CT but now has been restarted.  Interventions: -- No RRT Interventions   Plan of Care: -- RN to page [facility] MD and update MD -- Monitor VS and mental status.  -- Rest per medical team  Event Summary:  Start Time 0915 End Time [id number]

## 2019-08-22 DIAGNOSIS — R7881 Bacteremia: Secondary | ICD-10-CM | POA: Diagnosis not present

## 2019-08-22 DIAGNOSIS — B9561 Methicillin susceptible Staphylococcus aureus infection as the cause of diseases classified elsewhere: Secondary | ICD-10-CM | POA: Diagnosis not present

## 2019-08-22 DIAGNOSIS — I6340 Cerebral infarction due to embolism of unspecified cerebral artery: Secondary | ICD-10-CM | POA: Insufficient documentation

## 2019-08-22 LAB — BASIC METABOLIC PANEL
Anion gap: 10 (ref 5–15)
BUN: 49 mg/dL — ABNORMAL HIGH (ref 8–23)
CO2: 26 mmol/L (ref 22–32)
Calcium: 9 mg/dL (ref 8.9–10.3)
Chloride: 99 mmol/L (ref 98–111)
Creatinine, Ser: 3.4 mg/dL — ABNORMAL HIGH (ref 0.44–1.00)
GFR calc Af Amer: 15 mL/min — ABNORMAL LOW (ref >60)
GFR calc non Af Amer: 13 mL/min — ABNORMAL LOW (ref >60)
Glucose, Bld: 126 mg/dL — ABNORMAL HIGH (ref 70–99)
Potassium: 5.5 mmol/L — ABNORMAL HIGH (ref 3.5–5.1)
Sodium: 135 mmol/L (ref 135–145)

## 2019-08-22 LAB — FOLATE RBC
Folate, Hemolysate: 310 ng/mL
Folate, RBC: 1449 ng/mL (ref >498)
Hematocrit: 21.4 % — ABNORMAL LOW (ref 34.0–46.6)

## 2019-08-22 LAB — GLUCOSE, CAPILLARY
Glucose-Capillary: 116 mg/dL — ABNORMAL HIGH (ref 70–99)
Glucose-Capillary: 124 mg/dL — ABNORMAL HIGH (ref 70–99)
Glucose-Capillary: 127 mg/dL — ABNORMAL HIGH (ref 70–99)
Glucose-Capillary: 133 mg/dL — ABNORMAL HIGH (ref 70–99)
Glucose-Capillary: 143 mg/dL — ABNORMAL HIGH (ref 70–99)
Glucose-Capillary: 144 mg/dL — ABNORMAL HIGH (ref 70–99)
Glucose-Capillary: 156 mg/dL — ABNORMAL HIGH (ref 70–99)

## 2019-08-22 LAB — CBC
HCT: 23.2 % — ABNORMAL LOW (ref 36.0–46.0)
Hemoglobin: 6.9 g/dL — CL (ref 12.0–15.0)
MCH: 28.8 pg (ref 26.0–34.0)
MCHC: 29.7 g/dL — ABNORMAL LOW (ref 30.0–36.0)
MCV: 96.7 fL (ref 80.0–100.0)
Platelets: 457 10*3/uL — ABNORMAL HIGH (ref 150–400)
RBC: 2.4 MIL/uL — ABNORMAL LOW (ref 3.87–5.11)
RDW: 17.2 % — ABNORMAL HIGH (ref 11.5–15.5)
WBC: 15.6 10*3/uL — ABNORMAL HIGH (ref 4.0–10.5)
nRBC: 0 % (ref 0.0–0.2)

## 2019-08-22 LAB — HEPARIN LEVEL (UNFRACTIONATED)
Heparin Unfractionated: 0.18 IU/mL — ABNORMAL LOW (ref 0.30–0.70)
Heparin Unfractionated: 0.31 IU/mL (ref 0.30–0.70)

## 2019-08-22 LAB — PHOSPHORUS: Phosphorus: 4.6 mg/dL (ref 2.5–4.6)

## 2019-08-22 MED ORDER — FERROUS SULFATE 300 (60 FE) MG/5ML PO SYRP
300.0000 mg | ORAL_SOLUTION | Freq: Every day | ORAL | Status: DC
  Administered 2019-08-22 – 2019-09-05 (×15): 300 mg
  Filled 2019-08-22 (×16): qty 5

## 2019-08-22 MED ORDER — HEPARIN SODIUM (PORCINE) 1000 UNIT/ML IJ SOLN
INTRAMUSCULAR | Status: AC
  Administered 2019-08-22: 2400 [IU] via INTRAVENOUS
  Filled 2019-08-22: qty 3

## 2019-08-22 MED ORDER — CHLORHEXIDINE GLUCONATE CLOTH 2 % EX PADS
6.0000 | MEDICATED_PAD | Freq: Every day | CUTANEOUS | Status: DC
  Administered 2019-08-23 – 2019-09-04 (×11): 6 via TOPICAL

## 2019-08-22 NOTE — Progress Notes (Signed)
[facility] NEPHROLOGY PROGRESS NOTE  Assessment/ Plan: Pt is a 73 y.o. yo female with out of hospital cardiac arrest CPR for 10 minutes, history of CKD stage IV, CHF.  She was a started dialysis on 10/25 via right IJ catheter.  Urine culture with E. coli, CRRT initiated on 10/30/120.  #AKI on CKD stage IV: CKD due to hypertension.  AKI likely ATN in the setting of shock, cardiac arrest and bacteremia.  She was on CRRT since 10/30-11/1.   -Kidney ultrasound showed solitary right kidney with cortical thinning and cysts. -Seen by palliative, full scope of cr - Cont iHD THS Schedule: today 2L, 3.5h, no hep, 2K bath, Temp HD. Will need to move to [facility] consider AVF/AVG.   #Hyperkalemia: Cont HD, 5.5 today  #Acute encephalopathy: Appears encephalopathic today.  11/7 MRI with scattered small acute infarcts and chronic infarcts noted.  #Cardiac arrest, acute on chronic systolic CHF: EF [id number] by echo, cardiology is following.  EF improved in TEE.  #MSSA bacteremia: Continue cefazolin. End date six weeks from last negative culture 08/07/19 - 09/18/19.  #Hypotension: Currently off pressor.  Blood pressure is better.  Continue midodrine.  #Acute respiratory failure with hypoxia: HD today with UF.  On 2 L oxygen.  #Paroxysmal atrial fibrillation: Cardiology following.  On amiodarone and heparin drip.  Subjective: HD yesterday.  Today stable, boyfriend/HC POA at bedside, updated.   Objective Vital signs in last 24 hours: Vitals:   08/21/19 2348 08/22/19 0505 08/22/19 0748 08/22/19 0905  BP: 126/71 129/83 (!) 116/55   Pulse:  91 92 99  Resp:  (!) 32 (!) 33   Temp: 98 F (36.7 C) 98.7 F (37.1 C)  98.1 F (36.7 C)  TempSrc: Oral Axillary  Oral  SpO2:  98% 99% 99%  Weight:      Height:       Weight change:   Intake/Output Summary (Last 24 hours) at 08/22/2019 1327 Last data filed at 08/22/2019 0000 Gross per 24 hour  Intake -  Output 425 ml  Net -425 ml        Labs: Basic Metabolic Panel: Recent Labs  Lab 08/20/19 0523 08/21/19 1040 08/22/19 0448  NA 135 137 135  K 6.0* 4.9 5.5*  CL 98 99 99  CO2 24 28 26   GLUCOSE 135* 115* 126*  BUN 79* 37* 49*  CREATININE 3.84* 2.62* 3.40*  CALCIUM 8.6* 8.8* 9.0  PHOS 5.1* 4.1 4.6   Liver Function Tests: Recent Labs  Lab 08/18/19 1931  AST 10*  ALT <5  ALKPHOS 91  BILITOT 0.6  PROT 5.1*  ALBUMIN 1.7*   No results for input(s): LIPASE, AMYLASE in the last 168 hours. Recent Labs  Lab 08/20/19 1427  AMMONIA <9*   CBC: Recent Labs  Lab 08/18/19 1931 08/19/19 0420 08/20/19 0523 08/21/19 1040 08/22/19 0448  WBC 18.2* 17.6* 18.0* 16.6* 15.6*  HGB 7.5* 7.7* 7.3* 7.0* 6.9*  HCT 24.3* 25.9* 24.3* 23.4* 23.2*  MCV 93.1 94.9 94.6 95.5 96.7  PLT 393 409* 424* 429* 457*   Cardiac Enzymes: No results for input(s): CKTOTAL, CKMB, CKMBINDEX, TROPONINI in the last 168 hours. CBG: Recent Labs  Lab 08/21/19 2021 08/22/19 0026 08/22/19 0502 08/22/19 0747 08/22/19 1214  GLUCAP 134* 133* 116* 143* 144*    Iron Studies:  Recent Labs    08/21/19 1040  IRON 12*  TIBC 189*  FERRITIN 320*   Studies/Results: Ct Head Wo Contrast  Result Date: 08/21/2019 CLINICAL DATA:  Left-sided facial  droop. EXAM: CT HEAD WITHOUT CONTRAST TECHNIQUE: Contiguous axial images were obtained from the base of the skull through the vertex without intravenous contrast. COMPARISON:  August 18, 2019. FINDINGS: Brain: Mild chronic ischemic white matter disease is noted. No mass effect or midline shift is noted. Ventricular size is within normal limits. There is no evidence of mass lesion, hemorrhage or acute infarction. Vascular: No hyperdense vessel or unexpected calcification. Skull: Normal. Negative for fracture or focal lesion. Sinuses/Orbits: Bilateral ethmoid and sphenoid sinusitis is noted. Other: None. IMPRESSION: Mild chronic ischemic white matter disease. No acute intracranial abnormality seen.  Electronically Signed   By: Marijo Conception M.D.   On: 08/21/2019 07:40   Dg Chest Port 1 View  Result Date: 08/21/2019 CLINICAL DATA:  Shortness of breath. EXAM: PORTABLE CHEST 1 VIEW COMPARISON:  One-view chest x-ray 08/14/2019 FINDINGS: The heart is enlarged. Atherosclerotic calcifications are present at the aortic arch. Feeding tube courses off the inferior border the film. A right IJ line terminates in the mid SVC. Increased pulmonary vascular congestion is present. Progressive bilateral pleural effusions and basilar airspace disease is noted. Upper lung fields are clear. IMPRESSION: 1. Cardiomegaly with increasing pulmonary vascular congestion and bilateral effusions consistent with congestive heart failure. 2. Bibasilar airspace disease likely reflects atelectasis. Infection is not excluded. 3. Stable right IJ catheter. Electronically Signed   By: San Morelle M.D.   On: 08/21/2019 06:33    Medications: Infusions: . sodium chloride 10 mL (08/20/19 2025)  . sodium chloride    . sodium chloride    .  ceFAZolin (ANCEF) IV 1 g (08/21/19 2149)  . feeding supplement (OSMOLITE 1.5 CAL) 1,000 mL (08/22/19 0038)  . heparin 1,000 Units/hr (08/22/19 1023)  . valproate sodium 250 mg (08/22/19 1212)    Scheduled Medications: .  stroke: mapping our early stages of recovery book   Does not apply Once  . sodium chloride   Intravenous Once  . amiodarone  400 mg Per Tube Daily  . ARIPiprazole  10 mg Per Tube BID  . atorvastatin  20 mg Per NG tube Daily  . chlorhexidine  15 mL Mouth Rinse BID  . Chlorhexidine Gluconate Cloth  6 each Topical Q0600  . feeding supplement (PRO-STAT SUGAR FREE 64)  30 mL Per Tube Daily  . ferrous sulfate  300 mg Per Tube Q breakfast  . insulin aspart  0-9 Units Subcutaneous Q4H  . mouth rinse  15 mL Mouth Rinse BID  . midodrine  10 mg Per Tube TID WC  . pantoprazole (PROTONIX) IV  40 mg Intravenous Q24H  . sodium chloride flush  10-40 mL Intracatheter Q12H  .  Thrombi-Pad  1 each Topical Once    have reviewed scheduled and prn medications.  Physical Exam: General: Alert awake but remains encephalopathic, unchanged Heart:RRR, s1s2 nl, no rubs Lungs: Bibasal crackles, no increased work of breathing Abdomen:soft, Non-tender, non-distended Extremities: Trace edema Dialysis Access: Right IJ temporary catheter, site clean  Rexene Agent 08/22/2019,1:27 PM  LOS: 19 days  Pager: ID:[phone]

## 2019-08-22 NOTE — Progress Notes (Signed)
[place] for Heparin  Indication: atrial fibrillation  Allergies  Allergen Reactions  . Codeine Other (See Comments)    Reaction not recalled by family- was told to never take this    Patient Measurements: Height: 5\' 6"  (167.6 cm) Weight: 164 lb 10.9 oz (74.7 kg) IBW/kg (Calculated) : 59.3 Heparin Dosing Weight: 78 kg  Vital Signs: Temp: 98.7 F (37.1 C) (11/10 0505) Temp Source: Axillary (11/10 0505) BP: 129/83 (11/10 0505) Pulse Rate: 91 (11/10 0505)  Labs: Recent Labs    08/20/19 0523  08/20/19 2340 08/21/19 1040 08/22/19 0448  HGB 7.3*  --   --  7.0* 6.9*  HCT 24.3*  --   --  23.4* 23.2*  PLT 424*  --   --  429* 457*  HEPARINUNFRC 0.47   < > 0.38 0.39 0.18*  CREATININE 3.84*  --   --  2.62* 3.40*   < > = values in this interval not displayed.    Estimated Creatinine Clearance: 15.5 mL/min (A) (by C-G formula based on SCr of 3.4 mg/dL (H)).  Assessment: Pt is a 73 y/o female admitted s/p out of hospital cardiac arrest. ROSC was achieved and targeted temperature management was initiated. This admission has been complicated by continued encephalopathy, AKI on CKD which has advanced to dialysis, and hypotension.    MRI on 11/7 found several small acute infarcts though no hemorrhage.. Pt continues on heparin for Afib. Plans to change back to apixaban when no further procedures planned -heparin level = 0.18 -Hg = 6.9 (trend down)   Goal of Therapy:  Heparin level 0.3-0.5 units/ml Monitor platelets by anticoagulation protocol: Yes   Plan:  -Increase heparin to 1000 units/hr small change since patient has previously been at goal on 950 units) -Heparin level in 8 hours and daily wth CBC daily   Hildred Laser, PharmD Clinical Pharmacist **Pharmacist phone directory can now be found on [email or web address] ([email or web address]).  Listed under [place].

## 2019-08-22 NOTE — Progress Notes (Signed)
Nutrition Follow-up  DOCUMENTATION CODES:   Non-severe (moderate) malnutrition in context of acute illness/injury  INTERVENTION:   Tube Feeding:  Osmolite 1.5 at 50 ml/hr Pro-Stat 30 mL daily Provides 96 g of protein, 1900 kcals and 912 mL of free water  NUTRITION DIAGNOSIS:   Moderate Malnutrition related to acute illness(acute respiratory failure, AKI required CRRT, endocarditis) as evidenced by mild fat depletion, mild muscle depletion, moderate muscle depletion.  Ongoing.  GOAL:   Patient will meet greater than or equal to 90% of their needs  Meeting with TF.  MONITOR:   TF tolerance, Diet advancement, Labs, Weight trends, Skin  ASSESSMENT:   73 year old female who presented to the ED on 10/22 after cardiac arrest. PMH anemia, CHF, CKD stage IV, HTN, IDDM. Pt required intubation in the ED. TTM initiated.  10/22 -Admit, Intubated, normothermia TTM 10/24 - rewarmed, TF initiated 10/25 - HD catheter placed, first HD 10/27 - HD 10/28 - s/p TEE suspicious for vegetation 10/30 - CRRT initiated 11/01 - CRRT discontinued, extubated 11/03 -Cortrak tube placed, TF initiated  **RD working remotely**  Patient continues to be lethargic. Per SLP note 11/9, recommend continuation of NPO status and nutrition via alternative means. Per palliative care note, pt's POA desires full code.  Pt is currently tolerating Osmolite 1.5 @ 50 ml/hr via Cortrak.  Last HD 11/8.  Admission weight: 192 lbs. Current weight: 164 lbs.  I/Os: -10.2L since 10/27 UOP: 425 ml x 24 hrs  Medications: Ferrous sulfate Labs reviewed: CBGs: 116-143 Elevated K Phos WNL  Diet Order:   Diet Order            Diet NPO time specified  Diet effective now              EDUCATION NEEDS:   Not appropriate for education at this time  Skin:  Skin Assessment: Reviewed RN Assessment Skin Integrity Issues:: DTI DTI: sacrum  Last BM:  11/10 -type 6  Height:   Ht Readings from Last 1 Encounters:   08/03/19 5\' 6"  (1.676 m)    Weight:   Wt Readings from Last 1 Encounters:  08/21/19 74.7 kg    Ideal Body Weight:  59.1 kg  BMI:  Body mass index is 26.58 kg/m.  Estimated Nutritional Needs:   Kcal:  [id number]  Protein:  89-105  Fluid:  >/= 1.5 L  Clayton Bibles, MS, RD, LDN Inpatient Clinical Dietitian Pager: [phone] After Hours Pager: [phone]

## 2019-08-22 NOTE — Progress Notes (Addendum)
PROGRESS NOTE  Jaclyn Day [id number] DOB: 1946-09-13 DOA: 08/03/2019 PCP: Cyndi Bender, PA-C  HPI/Recap of past 24 hours: Patient is 73 year old with history of congenital unilateral kidney, stage IV chronic kidney disease with baseline creatinine about 2.2-2.8, hypertension, persistent A. fib on anticoagulation with Eliquis, chronic diastolic heart failure, history of cardioversion for A. fib admitted to ICU after witnessed cardiac arrest.  Patient from home. 10/22 Admitted post arrest, intubated, initiation of targeted temperature management 36 celsius.   ECHO: LVEF: 20-25%, global hypokinesis, RV midly reduced function with normal size, mild MR, normal pulmonary pressures. Lactate cleared 10/25 - staph identified in blood and vanc started. Still with poor Ur OP. On amio gtt. Off leveophed and neo On Heparin gtt. iHD w/ 1L off 10/26- following simple commands, placed on precedex for agitation, febrile- re-cultured and femoral CVL discontinued  10/28- converted to NSR, TEE  10/30 - CRRT for volume removal Blood culture 10/22 >> MSSA  1/4 COVID 10/22 >> Negative RVP 10/22 >> Negative Urine 10/22 - E colii 10/26 BCx 2 >> neg  Remains in very poor mentation.  08/22/19: Patient was seen and examined at her bedside this morning.  Her sister in the room.  Patient is more alert and interactive this morning.  Able to pick her left and right arm off the bed.  Answers simple questions.    Assessment/Plan: Principal Problem:   MSSA bacteremia Active Problems:   Cardiac arrest ([place])   Pressure injury of skin   Acute respiratory failure with hypoxia (HCC)   AKI (acute kidney injury) (HCC)   Elevated troponin   Persistent atrial fibrillation (HCC)   Acute on chronic combined systolic and diastolic CHF (congestive heart failure) (HCC)   Acute bacterial endocarditis   Malnutrition of moderate degree   Advanced care planning/counseling discussion   Goals of care,  counseling/discussion   Palliative care by specialist   Closed fracture of multiple ribs   Cerebral embolism with cerebral infarction   Acute hypoxic respiratory failure due to cardiac arrest and pulmonary edema/bilateral pleural effusions:  Presented after out of hospital cardiac arrest.   Was intubated and extubated to nasal cannula oxygen.   Continue to maintain O2 saturation greater than 92%.  Chest physiotherapy and incentive spirometry if patient can tolerate.  Independently reviewed chest x-ray done on 08/21/2019 which showed cardiomegaly with increase in pulmonary vascularity and pulmonary infiltrate versus atelectasis right mid to right lower lobes.  Bilateral pleural effusions. Volume status management by nephrology through hemodialysis  Improving acute metabolic encephalopathy suspect multifactorial with recent multiple acute CVA: In the setting of cardiac arrest hypothermia, MSSA bacteremia, prolonged hospitalization, versus others.  CT head 08/16/2019 stable.  Mental status improved 08/22/2019: Alert and interactive and following commands.  Answers to simple questions. On valproic acid as longstanding medications.   EEG with no evidence of seizure activity. Palliative care team following. MRI done on 08/19/2019 showed several scattered small acute infarcts in the white matter of the superior frontal lobes, left occipital lobe.  No associated hemorrhage or mass-effect.  Also several small subacute or perhaps chronic infarcts in the bilateral cerebellum. Neurology following.  Appreciate recommendations. TTE completed no evidence of vegetation or thrombus. CT head without contrast on 08/21/2019 no sign of intracranial acute hemorrhage or acute ischemia.  Multiple acute/subacute CVA involving white matter of the superior frontal lobes and left occipital lobe No hemorrhage, on heparin drip, neurology following.  Okay to continue anticoagulation per neuro.  Continue to closely monitor with  neuro checks.  Witnessed cardiac arrest in the setting of history of chronic atrial fibrillation and coronary artery disease: Patient treated with hypothermia protocol.  Followed by cardiology.  Developed intermittent rapid A. fib.  Currently on amiodarone 400 mg daily with prolonged taper and maintenance planned. Remains on heparin infusion, continue heparin infusion until no procedures planned.  Switch to Eliquis after final plans. Patient is a still in A. fib but rate is better controlled.  Acute on chronic systolic heart failure: TTE on 10/22 with EF 20%.  Repeat TEE with improved ejection fraction to 60%.  CAD evaluation pending renal improvement.  MSSA bacteremia  Repeat cultures negative.   Seen by infectious disease.   Recommended cefazolin until 12/7. TTE no evidence of endocarditis or thrombus.  Acute blood loss anemia No sign of overt bleeding Hemoglobin 7.0 on 08/21/2019 Transfuse 1 unit PRBC at hemodialysis Repeat CBC in the morning  AKI on CKD stage IV: Suspected AKI likely ATN in the setting of shock ,cardiac arrest and bacteremia.  Started on CRRT.  Now on intermittent hemodialysis.  dialysis 08/18/2019, 08/20/2019.  Nephrology following. Will need to move to [facility]  Hyperkalemia Management per nephrology with HD  Diet: Currently on tube feeding via core track tube.   Seen by speech therapist Continue aspiration precautions  Physical debility/ambulatory dysfunction PT OT assessment recommended SNF CSW consulted for assistance with SNF placement.  DVT prophylaxis: Heparin drip due to possible procedure for hemodialysis. Code Status: Full code Family Communication:  Updated her sister at bedside 08/22/2019. Disposition Plan:  Possible discharge to SNF in the next 48 to 72 hours once can tolerate oral intake.   Consultants:   Nephrology  Cardiology  PCCM  Palliative care team.   Procedures:   CRRT  HD   Antimicrobials:  Rocephin 10/22 >>  10/25 Vanc (staph in blood ) 10/24  Cefazolin 10/25 >>    Objective: Vitals:   08/21/19 2348 08/22/19 0505 08/22/19 0748 08/22/19 0905  BP: 126/71 129/83 (!) 116/55   Pulse:  91 92 99  Resp:  (!) 32 (!) 33   Temp: 98 F (36.7 C) 98.7 F (37.1 C)  98.1 F (36.7 C)  TempSrc: Oral Axillary  Oral  SpO2:  98% 99% 99%  Weight:      Height:        Intake/Output Summary (Last 24 hours) at 08/22/2019 1424 Last data filed at 08/22/2019 0000 Gross per 24 hour  Intake --  Output 225 ml  Net -225 ml   Filed Weights   08/20/19 0800 08/20/19 1143 08/21/19 0500  Weight: 77 kg 75.4 kg 74.7 kg    Exam:   General: 73 y.o. year-old female well-developed well-nourished in no acute distress.  Alert and interactive.    Cardiovascular: Regular rate and rhythm no rubs or gallops.   Respiratory: Mild rales at bases no wheezing noted.  Poor inspiratory effort.    Abdomen: Soft nontender nondistended no bowel sounds present.   Musculoskeletal: Diffuse dependent edema all throughout.  Psychiatry: Mood is appropriate for condition and setting.   Data Reviewed: CBC: Recent Labs  Lab 08/18/19 1931 08/19/19 0420 08/20/19 0523 08/21/19 1040 08/22/19 0448  WBC 18.2* 17.6* 18.0* 16.6* 15.6*  HGB 7.5* 7.7* 7.3* 7.0* 6.9*  HCT 24.3* 25.9* 24.3* 23.4* 23.2*  MCV 93.1 94.9 94.6 95.5 96.7  PLT 393 409* 424* 429* [id number]*   Basic Metabolic Panel: Recent Labs  Lab 08/15/19 1713  08/16/19 0500 08/16/19 1630 08/17/19 0222 08/18/19 0249 08/18/19  1931 08/19/19 0420 08/20/19 0523 08/21/19 1040 08/22/19 0448  NA  --    < > 135  --  135 134* 133* 134* 135 137 135  K  --    < > 4.1  --  4.6 5.5* 5.9* 5.4* 6.0* 4.9 5.5*  CL  --    < > 99  --  98 99 98 97* 98 99 99  CO2  --    < > 26  --  24 24 23 25 24 28 26   GLUCOSE  --    < > 122*  --  115* 119* 151* 100* 135* 115* 126*  BUN  --    < > 25*  --  47* 67* 80* 66* 79* 37* 49*  CREATININE  --    < > 2.09*  --  2.99* 3.70* 3.99* 3.46* 3.84*  2.62* 3.40*  CALCIUM  --    < > 8.3*  --  8.4* 8.3* 8.4* 8.6* 8.6* 8.8* 9.0  MG 1.9  --  2.2 2.1 2.2 2.2  --   --   --   --   --   PHOS 3.0  --  4.8* 4.5 4.6 4.8*  --  5.2* 5.1* 4.1 4.6   < > = values in this interval not displayed.   GFR: Estimated Creatinine Clearance: 15.5 mL/min (A) (by C-G formula based on SCr of 3.4 mg/dL (H)). Liver Function Tests: Recent Labs  Lab 08/18/19 1931  AST 10*  ALT <5  ALKPHOS 91  BILITOT 0.6  PROT 5.1*  ALBUMIN 1.7*   No results for input(s): LIPASE, AMYLASE in the last 168 hours. Recent Labs  Lab 08/20/19 1427  AMMONIA <9*   Coagulation Profile: No results for input(s): INR, PROTIME in the last 168 hours. Cardiac Enzymes: No results for input(s): CKTOTAL, CKMB, CKMBINDEX, TROPONINI in the last 168 hours. BNP (last 3 results) No results for input(s): PROBNP in the last 8760 hours. HbA1C: Recent Labs    08/20/19 0523  HGBA1C 5.9*   CBG: Recent Labs  Lab 08/21/19 2021 08/22/19 0026 08/22/19 0502 08/22/19 0747 08/22/19 1214  GLUCAP 134* 133* 116* 143* 144*   Lipid Profile: Recent Labs    08/20/19 0523  CHOL 92  HDL 26*  LDLCALC 50  TRIG 79  CHOLHDL 3.5   Thyroid Function Tests: No results for input(s): TSH, T4TOTAL, FREET4, T3FREE, THYROIDAB in the last 72 hours. Anemia Panel: Recent Labs    08/21/19 1040  VITAMINB12 393  FERRITIN 320*  TIBC 189*  IRON 12*   Urine analysis:    Component Value Date/Time   COLORURINE BROWN (A) 08/05/2019 2018   APPEARANCEUR TURBID (A) 08/05/2019 2018   LABSPEC RESULTS UNAVAILABLE DUE TO INTERFERING SUBSTANCE 08/05/2019 2018   PHURINE RESULTS UNAVAILABLE DUE TO INTERFERING SUBSTANCE 08/05/2019 2018   GLUCOSEU RESULTS UNAVAILABLE DUE TO INTERFERING SUBSTANCE (A) 08/05/2019 2018   HGBUR RESULTS UNAVAILABLE DUE TO INTERFERING SUBSTANCE (A) 08/05/2019 2018   BILIRUBINUR RESULTS UNAVAILABLE DUE TO INTERFERING SUBSTANCE (A) 08/05/2019 2018   KETONESUR RESULTS UNAVAILABLE DUE TO  INTERFERING SUBSTANCE (A) 08/05/2019 2018   PROTEINUR RESULTS UNAVAILABLE DUE TO INTERFERING SUBSTANCE (A) 08/05/2019 2018   UROBILINOGEN 0.2 05/07/2009 1520   NITRITE RESULTS UNAVAILABLE DUE TO INTERFERING SUBSTANCE (A) 08/05/2019 2018   LEUKOCYTESUR RESULTS UNAVAILABLE DUE TO INTERFERING SUBSTANCE (A) 08/05/2019 2018   Sepsis Labs: @LABRCNTIP (procalcitonin:4,lacticidven:4)  )No results found for this or any previous visit (from the past 240 hour(s)).    Studies: No results found.  Scheduled Meds:   stroke: mapping our early stages of recovery book   Does not apply Once   sodium chloride   Intravenous Once   amiodarone  400 mg Per Tube Daily   ARIPiprazole  10 mg Per Tube BID   atorvastatin  20 mg Per NG tube Daily   chlorhexidine  15 mL Mouth Rinse BID   Chlorhexidine Gluconate Cloth  6 each Topical Q0600   [START ON 08/23/2019] Chlorhexidine Gluconate Cloth  6 each Topical Q0600   feeding supplement (PRO-STAT SUGAR FREE 64)  30 mL Per Tube Daily   ferrous sulfate  300 mg Per Tube Q breakfast   insulin aspart  0-9 Units Subcutaneous Q4H   mouth rinse  15 mL Mouth Rinse BID   midodrine  10 mg Per Tube TID WC   pantoprazole (PROTONIX) IV  40 mg Intravenous Q24H   sodium chloride flush  10-40 mL Intracatheter Q12H   Thrombi-Pad  1 each Topical Once    Continuous Infusions:  sodium chloride 10 mL (08/20/19 2025)   sodium chloride     sodium chloride      ceFAZolin (ANCEF) IV 1 g (08/21/19 2149)   feeding supplement (OSMOLITE 1.5 CAL) 1,000 mL (08/22/19 0038)   heparin 1,000 Units/hr (08/22/19 1023)   valproate sodium 250 mg (08/22/19 1212)     LOS: 19 days     Kayleen Memos, MD [facility] Pager [phone]  If 7PM-7AM, please contact night-coverage [email or web address] Password [email or web address] 08/22/2019, 2:24 PM

## 2019-08-22 NOTE — Progress Notes (Signed)
CRITICAL VALUE ALERT Hemoglobin Critical Value:  6.9  Date & Time Notied:  11/10  0615 Provider Notified: [facility]  Orders Received/Actions taken: awaiting orders

## 2019-08-22 NOTE — Progress Notes (Signed)
  Speech Language Pathology Treatment: Dysphagia  Patient Details Name: JAYELYN BANTHER MRN: [id number] DOB: Oct 10, 1946 Today's Date: 08/22/2019 Time: 1338-1400 SLP Time Calculation (min) (ACUTE ONLY): 22 min  Assessment / Plan / Recommendation Clinical Impression  Pt was encountered lethargic in bed with boyfriend present at bedside.  Oral care was completed with swab and suction prior to po trials.  Pt was then seen with trials of ice chips and thin liquid via 1/2 tsp.  She exhibited poor labial closure despite max verbal and tactile cues.  It appeared that she was attempting labial closure but that she was having difficulty coordinating the movement, possibly suggestive of oral apraxia.  Pt accepted passive trials to her oral cavity and she presented with decreased lingual movement; however, a swallow initiation was observed in 3/3 thin liquid trials.  No swallow initiation was observed with ice chips trials and they were subsequently suctioned from the pt's oral cavity.  She exhibited a cough following 1/3 thin liquid trials; otherwise no clinical s/sx of aspiration were observed.  Pt demonstrated improvement in today's tx session; however, she remains at an elevated risk for aspiration, therefore continue to recommend NPO with alternative means of nutrition and frequent oral care.  SLP will continue to f/u per POC.     HPI HPI: Pt is a 73 yo F admitted for witnessed out of hospital cardiac arrest, unclear etiology, with field ROSC, resultant respiratory failure and encephalopathy s/p TTM; ETT 10/22-11/1. CRRT initiated 10/30. PMH: unilateral congenital absence of kidney, Afib, HTN, CKD, CHF, anemia, mood disorder.  MRI on 11/7 revealed "Several scattered small acute infarcts in the white matter of the superior frontal lobes, left occipital lobe. No associated hemorrhage or mass effect. 2. Several small subacute or perhaps chronic infarcts in the bilateral cerebellum."      SLP Plan  Continue  with current plan of care       Recommendations  Diet recommendations: NPO Medication Administration: Via alternative means                Oral Care Recommendations: Oral care QID;Staff/trained caregiver to provide oral care Follow up Recommendations: Skilled Nursing facility;24 hour supervision/assistance SLP Visit Diagnosis: Dysphagia, unspecified (R13.10) Plan: Continue with current plan of care       Colin Mulders M.S., [place] Office: [phone]               [place] 08/22/2019, 3:39 PM

## 2019-08-22 NOTE — Progress Notes (Signed)
STROKE TEAM PROGRESS NOTE       INTERVAL HISTORY Her nurse and friend are at the bedside.  She is more alert and interactive today.  She has had no seizures.  She has not yet passed her swallow eval and has a core track tube.    OBJECTIVE Vitals:   08/21/19 2348 08/22/19 0505 08/22/19 0748 08/22/19 0905  BP: 126/71 129/83 (!) 116/55   Pulse:  91 92 99  Resp:  (!) 32 (!) 33   Temp: 98 F (36.7 C) 98.7 F (37.1 C)  98.1 F (36.7 C)  TempSrc: Oral Axillary  Oral  SpO2:  98% 99% 99%  Weight:      Height:        CBC:  Recent Labs  Lab 08/21/19 1040 08/22/19 0448  WBC 16.6* 15.6*  HGB 7.0* 6.9*  HCT 23.4* 23.2*  MCV 95.5 96.7  PLT 429* 457*    Basic Metabolic Panel:  Recent Labs  Lab 08/17/19 0222 08/18/19 0249  08/21/19 1040 08/22/19 0448  NA 135 134*   < > 137 135  K 4.6 5.5*   < > 4.9 5.5*  CL 98 99   < > 99 99  CO2 24 24   < > 28 26  GLUCOSE 115* 119*   < > 115* 126*  BUN 47* 67*   < > 37* 49*  CREATININE 2.99* 3.70*   < > 2.62* 3.40*  CALCIUM 8.4* 8.3*   < > 8.8* 9.0  MG 2.2 2.2  --   --   --   PHOS 4.6 4.8*   < > 4.1 4.6   < > = values in this interval not displayed.    Lipid Panel:     Component Value Date/Time   CHOL 92 08/20/2019 0523   TRIG 79 08/20/2019 0523   HDL 26 (L) 08/20/2019 0523   CHOLHDL 3.5 08/20/2019 0523   VLDL 16 08/20/2019 0523   LDLCALC 50 08/20/2019 0523   HgbA1c:  Lab Results  Component Value Date   HGBA1C 5.9 (H) 08/20/2019   Urine Drug Screen: No results found for: LABOPIA, COCAINSCRNUR, LABBENZ, AMPHETMU, THCU, LABBARB  Alcohol Level No results found for: [facility]  IMAGING   Ct Head Wo Contrast  Result Date: 08/21/2019 CLINICAL DATA:  Left-sided facial droop. EXAM: CT HEAD WITHOUT CONTRAST TECHNIQUE: Contiguous axial images were obtained from the base of the skull through the vertex without intravenous contrast. COMPARISON:  August 18, 2019. FINDINGS: Brain: Mild chronic ischemic white matter disease is noted. No  mass effect or midline shift is noted. Ventricular size is within normal limits. There is no evidence of mass lesion, hemorrhage or acute infarction. Vascular: No hyperdense vessel or unexpected calcification. Skull: Normal. Negative for fracture or focal lesion. Sinuses/Orbits: Bilateral ethmoid and sphenoid sinusitis is noted. Other: None. IMPRESSION: Mild chronic ischemic white matter disease. No acute intracranial abnormality seen. Electronically Signed   By: Marijo Conception M.D.   On: 08/21/2019 07:40   Dg Chest Port 1 View  Result Date: 08/21/2019 CLINICAL DATA:  Shortness of breath. EXAM: PORTABLE CHEST 1 VIEW COMPARISON:  One-view chest x-ray 08/14/2019 FINDINGS: The heart is enlarged. Atherosclerotic calcifications are present at the aortic arch. Feeding tube courses off the inferior border the film. A right IJ line terminates in the mid SVC. Increased pulmonary vascular congestion is present. Progressive bilateral pleural effusions and basilar airspace disease is noted. Upper lung fields are clear. IMPRESSION: 1. Cardiomegaly with increasing pulmonary vascular  congestion and bilateral effusions consistent with congestive heart failure. 2. Bibasilar airspace disease likely reflects atelectasis. Infection is not excluded. 3. Stable right IJ catheter. Electronically Signed   By: San Morelle M.D.   On: 08/21/2019 06:33   Transthoracic Echocardiogram  08/03/2019 IMPRESSIONS  1. Left ventricular ejection fraction, by visual estimation, is 20 to 25%. The left ventricle has severely decreased function. Normal left ventricular size. There is moderately increased left ventricular hypertrophy. Severe global hypokinesis.  2. Left ventricular diastolic function could not be evaluated pattern of LV diastolic filling.  3. Global right ventricle has mildly reduced systolic function.The right ventricular size is normal. No increase in right ventricular wall thickness.  4. Left atrial size was severely  dilated.  5. Right atrial size was normal.  6. The mitral valve is abnormal. Mild mitral valve regurgitation.  7. The tricuspid valve is grossly normal. Tricuspid valve regurgitation is trivial.  8. The aortic valve is tricuspid Aortic valve regurgitation was not visualized by color flow Doppler.  9. The pulmonic valve was grossly normal. Pulmonic valve regurgitation is not visualized by color flow Doppler. 10. Normal pulmonary artery systolic pressure. 11. The inferior vena cava is normal in size with <50% respiratory variability, suggesting right atrial pressure of 8 mmHg. (on vent) 12. The interatrial septum was not well visualized.   Repeat Transthoracic Echocardiogram -normal ejection fraction.  No cardiac source of embolism.   Bilateral Carotid Dopplers  00/00/2020 Pending   ECG - atrial fibrillation - VR - 102 BPM. (See cardiology reading for complete details)   EEG  08/03/2019 IMPRESSION: This study issuggestive of severe diffuse encephalopathy, non specific to etiology.No seizures or epileptiform discharges were seen throughout the recording. EEG 08/18/2028 IMPRESSION: This study is suggestive of mild to moderate diffuse encephalopathy, non specific to etiology.  No seizures or epileptiform discharges were seen throughout the recording. One episode of left hand shaking/tremor was noted without EEG change and was therefore likely non epileptic.   PHYSICAL EXAM Blood pressure (!) 116/55, pulse 99, temperature 98.1 F (36.7 C), temperature source Oral, resp. rate (!) 33, height 5\' 6"  (1.676 m), weight 74.7 kg, SpO2 99 %. Elderly Caucasian lady not in distress. . Afebrile. Head is nontraumatic. Neck is supple without bruit.    Cardiac exam no murmur or gallop. Lungs are clear to auscultation. Distal pulses are well felt. Neurological Exam ; Patient is awake and interactive follows  commands and speaks short sentences s.  Eyes are closed.  Left gaze preference with moved to  the right till midline.  Pupils equal reactive.  Fundi not visualized.  Able to move all 4 extremities against gravity but will not cooperate for detailed muscle testing.  No focal weakness.  Deep tendon reflexes symmetric.  Plantars downgoing.  Gait not tested. ASSESSMENT/PLAN Ms. Jaclyn Day is a 73 y.o. female with history of persistent afib ( eliquis), HTN, CKD, CHF, and Cardiomyopathy who presented to [facility] after cardiac arrest. Neurology consulted for continued AMS.. She did not receive IV t-PA due to late presentation (>4.5 hours from time of onset).   Stroke:  Several scattered small acute infarcts in the white matter of the superior frontal lobes, left occipital lobe - embolic - afib vs watershed due to cardiac arrest   Code Stroke CT Head - not ordered  CT head  - no acute findings.  MRI head - Several scattered small acute infarcts in the white matter of the superior frontal lobes, left occipital lobe. No associated  hemorrhage or mass effect. Several small subacute or perhaps chronic infarcts in the bilateral cerebellum.  MRA head - not ordered  CTA H&N - not ordered  CT Perfusion - not ordered  Carotid Doppler - pending  2D Echo - 08/03/19 - EF 20 - 25%. No cardiac source of emboli identified.   2D Echo -08/20/2009 normal ejection fraction  [employer] Virus 2 - negative  LDL - 50  HgbA1c - 5.9  UDS - not ordered  VTE prophylaxis - IV Heparin Diet  Diet Order            Diet NPO time specified  Diet effective now              Eliquis (apixaban) daily prior to admission, now on heparin IV  Patient counseled to be compliant with her antithrombotic medications  Ongoing aggressive stroke risk factor management  Therapy recommendations:  pending  Disposition:  Pending  Hypertension  Home BP meds: norvasc ; metoprolol  Current BP meds: none   Stable - mildly low at times . Permissive hypertension (BP parameters per cardiology) but gradually  normalize in 5-7 days . Long-term BP goal normotensive  Hyperlipidemia  Home Lipid lowering medication: Lipitor 20 mg daily.  Continue current dose and patient does not need high intensity statin since LDL is at goal on 20 mg dose  LDL 50, goal < 70  Current lipid lowering medication: Lipitor 20 mg daily   Continue statin at discharge  Other Stroke Risk Factors  Advanced age  Previous ETOH use  Hx stroke/TIA - by imaging  Coronary artery disease  Congestive Heart Failure  Atrial fibrillation  Cardiomyopathy  Other Active Problems  CKD - solitary kidney - creatinine - 3.84  Palliative care following  Leukocytosis - 18 (afebrile / temp - 99.4 )  Anemia - Hb - 7.3  Hyperkalemia - 6.0    Hospital day # 19 Continue IV heparin for now and subsequently changed to Eliquis when she is able to swallow.  Continue ongoing therapies.  She will likely need transfer to skilled nursing facility for rehabilitation when she is able to swallow.Await  carotid dopplers.  Continue present dose of statin as LDL is optimal she does not need high intensity statin greater than 50% time during this 25-minute visit was spent on counseling and coordination of care about her strokes and discussion with care team and answering questions.  Discussed with Dr. Francia Greaves Stroke team will sign off.  Kindly call for questions. Antony Contras, MD Medical Director [facility] Pager: [phone] 08/22/2019 12:50 PM        To contact Stroke Continuity provider, please refer to [email or web address] After hours, contact General Neurology

## 2019-08-23 ENCOUNTER — Encounter (HOSPITAL_COMMUNITY): Payer: Self-pay | Admitting: Interventional Radiology

## 2019-08-23 ENCOUNTER — Inpatient Hospital Stay (HOSPITAL_COMMUNITY): Payer: 59

## 2019-08-23 DIAGNOSIS — Z515 Encounter for palliative care: Secondary | ICD-10-CM | POA: Diagnosis not present

## 2019-08-23 DIAGNOSIS — B9561 Methicillin susceptible Staphylococcus aureus infection as the cause of diseases classified elsewhere: Secondary | ICD-10-CM | POA: Diagnosis not present

## 2019-08-23 DIAGNOSIS — R7881 Bacteremia: Secondary | ICD-10-CM | POA: Diagnosis not present

## 2019-08-23 DIAGNOSIS — J9601 Acute respiratory failure with hypoxia: Secondary | ICD-10-CM | POA: Diagnosis not present

## 2019-08-23 DIAGNOSIS — I5043 Acute on chronic combined systolic (congestive) and diastolic (congestive) heart failure: Secondary | ICD-10-CM | POA: Diagnosis not present

## 2019-08-23 DIAGNOSIS — Z7189 Other specified counseling: Secondary | ICD-10-CM | POA: Diagnosis not present

## 2019-08-23 DIAGNOSIS — I4891 Unspecified atrial fibrillation: Secondary | ICD-10-CM | POA: Diagnosis not present

## 2019-08-23 HISTORY — PX: IR FLUORO GUIDE CV LINE RIGHT: IMG2283

## 2019-08-23 HISTORY — PX: IR US GUIDE VASC ACCESS RIGHT: IMG2390

## 2019-08-23 LAB — BASIC METABOLIC PANEL
Anion gap: 12 (ref 5–15)
BUN: 30 mg/dL — ABNORMAL HIGH (ref 8–23)
CO2: 28 mmol/L (ref 22–32)
Calcium: 8.9 mg/dL (ref 8.9–10.3)
Chloride: 95 mmol/L — ABNORMAL LOW (ref 98–111)
Creatinine, Ser: 2.3 mg/dL — ABNORMAL HIGH (ref 0.44–1.00)
GFR calc Af Amer: 24 mL/min — ABNORMAL LOW (ref >60)
GFR calc non Af Amer: 21 mL/min — ABNORMAL LOW (ref >60)
Glucose, Bld: 148 mg/dL — ABNORMAL HIGH (ref 70–99)
Potassium: 4.6 mmol/L (ref 3.5–5.1)
Sodium: 135 mmol/L (ref 135–145)

## 2019-08-23 LAB — CBC
HCT: 25.1 % — ABNORMAL LOW (ref 36.0–46.0)
Hemoglobin: 7.8 g/dL — ABNORMAL LOW (ref 12.0–15.0)
MCH: 29.2 pg (ref 26.0–34.0)
MCHC: 31.1 g/dL (ref 30.0–36.0)
MCV: 94 fL (ref 80.0–100.0)
Platelets: 386 10*3/uL (ref 150–400)
RBC: 2.67 MIL/uL — ABNORMAL LOW (ref 3.87–5.11)
RDW: 17.2 % — ABNORMAL HIGH (ref 11.5–15.5)
WBC: 15.6 10*3/uL — ABNORMAL HIGH (ref 4.0–10.5)
nRBC: 0 % (ref 0.0–0.2)

## 2019-08-23 LAB — HEPARIN LEVEL (UNFRACTIONATED): Heparin Unfractionated: <0.1 IU/mL — ABNORMAL LOW (ref 0.30–0.70)

## 2019-08-23 LAB — TYPE AND SCREEN
ABO/RH(D): O POS
Antibody Screen: NEGATIVE
Unit division: 0

## 2019-08-23 LAB — BPAM RBC
Blood Product Expiration Date: 202011162359
ISSUE DATE / TIME: 202011101635
Unit Type and Rh: 5100

## 2019-08-23 LAB — GLUCOSE, CAPILLARY
Glucose-Capillary: 131 mg/dL — ABNORMAL HIGH (ref 70–99)
Glucose-Capillary: 136 mg/dL — ABNORMAL HIGH (ref 70–99)
Glucose-Capillary: 127 mg/dL — ABNORMAL HIGH (ref 70–99)
Glucose-Capillary: 148 mg/dL — ABNORMAL HIGH (ref 70–99)

## 2019-08-23 LAB — PHOSPHORUS: Phosphorus: 3 mg/dL (ref 2.5–4.6)

## 2019-08-23 LAB — MAGNESIUM: Magnesium: 2 mg/dL (ref 1.7–2.4)

## 2019-08-23 MED ORDER — GELATIN ABSORBABLE 12-7 MM EX MISC
CUTANEOUS | Status: AC
  Filled 2019-08-23: qty 1

## 2019-08-23 MED ORDER — AMIODARONE HCL IN DEXTROSE 360-4.14 MG/200ML-% IV SOLN
60.0000 mg/h | INTRAVENOUS | Status: AC
  Administered 2019-08-23 (×2): 60 mg/h via INTRAVENOUS

## 2019-08-23 MED ORDER — LIDOCAINE-EPINEPHRINE (PF) 1 %-1:200000 IJ SOLN
INTRAMUSCULAR | Status: AC | PRN
  Administered 2019-08-23: 10 mL via INTRADERMAL

## 2019-08-23 MED ORDER — FENTANYL CITRATE (PF) 100 MCG/2ML IJ SOLN
INTRAMUSCULAR | Status: AC | PRN
  Administered 2019-08-23: 25 ug via INTRAVENOUS

## 2019-08-23 MED ORDER — LIDOCAINE-EPINEPHRINE (PF) 1 %-1:200000 IJ SOLN
INTRAMUSCULAR | Status: AC
  Filled 2019-08-23: qty 30

## 2019-08-23 MED ORDER — AMIODARONE HCL IN DEXTROSE 360-4.14 MG/200ML-% IV SOLN
30.0000 mg/h | INTRAVENOUS | Status: DC
  Administered 2019-08-23 – 2019-08-31 (×14): 30 mg/h via INTRAVENOUS
  Filled 2019-08-23 (×16): qty 200

## 2019-08-23 MED ORDER — FENTANYL CITRATE (PF) 100 MCG/2ML IJ SOLN
INTRAMUSCULAR | Status: AC
  Filled 2019-08-23: qty 2

## 2019-08-23 MED ORDER — AMIODARONE IV BOLUS ONLY 150 MG/100ML
150.0000 mg | Freq: Once | INTRAVENOUS | Status: AC
  Administered 2019-08-23: 150 mg via INTRAVENOUS

## 2019-08-23 MED ORDER — HEPARIN SODIUM (PORCINE) 1000 UNIT/ML IJ SOLN
INTRAMUSCULAR | Status: AC
  Filled 2019-08-23: qty 1

## 2019-08-23 MED ORDER — HEPARIN SODIUM (PORCINE) 1000 UNIT/ML IJ SOLN
INTRAMUSCULAR | Status: AC | PRN
  Administered 2019-08-23: 3200 [IU] via INTRAVENOUS

## 2019-08-23 MED ORDER — CHLORHEXIDINE GLUCONATE 4 % EX LIQD
CUTANEOUS | Status: AC
  Administered 2019-08-23: 18:00:00
  Filled 2019-08-23: qty 15

## 2019-08-23 MED ORDER — AMIODARONE HCL IN DEXTROSE 360-4.14 MG/200ML-% IV SOLN
INTRAVENOUS | Status: AC
  Filled 2019-08-23: qty 200

## 2019-08-23 MED ORDER — CEFAZOLIN SODIUM-DEXTROSE 2-4 GM/100ML-% IV SOLN
2.0000 g | INTRAVENOUS | Status: DC
  Administered 2019-08-23 – 2019-08-26 (×2): 2 g via INTRAVENOUS
  Filled 2019-08-23 (×6): qty 100

## 2019-08-23 MED ORDER — FUROSEMIDE 10 MG/ML IJ SOLN
80.0000 mg | Freq: Two times a day (BID) | INTRAMUSCULAR | Status: DC
  Administered 2019-08-23: 80 mg via INTRAVENOUS
  Filled 2019-08-23: qty 8

## 2019-08-23 NOTE — Progress Notes (Signed)
PHARMACY NOTE:  ANTIMICROBIAL RENAL DOSAGE ADJUSTMENT  Current antimicrobial regimen includes a mismatch between antimicrobial dosage and estimated renal function.  As per policy approved by the Pharmacy & Therapeutics and Medical Executive Committees, the antimicrobial dosage will be adjusted accordingly.  Current antimicrobial dosage:  Ancef 1gm IV q24h  Indication: MSSA bacteremia/possible endocarditis  Renal Function:  Estimated Creatinine Clearance: 15.8 mL/min (A) (by C-G formula based on SCr of 3.4 mg/dL (H)). [x]      On intermittent HD, scheduled: []      On CRRT    Antimicrobial dosage has been changed to:  Ancef 2gm IV TTS with HD  Additional comments:   Thank you for allowing pharmacy to be a part of this patient's care.  Hildred Laser, PharmD Clinical Pharmacist **Pharmacist phone directory can now be found on [email or web address] ([email or web address]).  Listed under [place].

## 2019-08-23 NOTE — Progress Notes (Signed)
Placed in IR

## 2019-08-23 NOTE — Progress Notes (Signed)
Daily Progress Note   Patient Name: Jaclyn Day       Date: 08/23/2019 DOB: 10-15-1945  Age: 73 y.o. MRN#: [id number] Attending Physician: Nolberto Hanlon, MD Primary Care Physician: Cyndi Bender, PA-C Admit Date: 08/03/2019  Reason for Consultation/Follow-up: Establishing goals of care  Subjective: Patient sitting up in chair- asleep. She awakens briefly when [name]- her HCPOA speaks to her and asks her if she prefers the bed or the chair- to which she replies- "chair".  Konrad Dolores continues to desire full scope care, dialysis. He is hopeful patient can be discharged to a SNF.  We discussed that patient still not eating, drinking safely per SLP- [name] says patient "wants to eat"- but he would consider PEG if needed.   ROS  Length of Stay: 20  Current Medications: Scheduled Meds:  . gelatin adsorbable      . heparin      .  stroke: mapping our early stages of recovery book   Does not apply Once  . sodium chloride   Intravenous Once  . ARIPiprazole  10 mg Per Tube BID  . atorvastatin  20 mg Per NG tube Daily  . chlorhexidine  15 mL Mouth Rinse BID  . Chlorhexidine Gluconate Cloth  6 each Topical Q0600  . Chlorhexidine Gluconate Cloth  6 each Topical Q0600  . feeding supplement (PRO-STAT SUGAR FREE 64)  30 mL Per Tube Daily  . ferrous sulfate  300 mg Per Tube Q breakfast  . furosemide  80 mg Intravenous BID  . insulin aspart  0-9 Units Subcutaneous Q4H  . mouth rinse  15 mL Mouth Rinse BID  . midodrine  10 mg Per Tube TID WC  . pantoprazole (PROTONIX) IV  40 mg Intravenous Q24H  . sodium chloride flush  10-40 mL Intracatheter Q12H  . Thrombi-Pad  1 each Topical Once    Continuous Infusions: . sodium chloride 10 mL (08/20/19 2025)  . sodium chloride    . sodium chloride    .  amiodarone 60 mg/hr (08/23/19 1513)  . amiodarone    . [START ON 08/24/2019]  ceFAZolin (ANCEF) IV    . feeding supplement (OSMOLITE 1.5 CAL) 1,000 mL (08/22/19 2231)  . heparin 1,050 Units/hr (08/23/19 0829)  . valproate sodium 250 mg (08/23/19 1148)    PRN Meds: sodium chloride, sodium  chloride, acetaminophen (TYLENOL) oral liquid 160 mg/5 mL, alteplase, heparin, hydrALAZINE, HYDROmorphone (DILAUDID) injection, levalbuterol, lidocaine (PF), lidocaine-prilocaine, pentafluoroprop-tetrafluoroeth, sodium chloride flush  Physical Exam          Vital Signs: BP 121/80   Pulse 98   Temp 98.4 F (36.9 C) (Oral)   Resp (!) 22   Ht 5\' 6"  (1.676 m)   Wt 78.2 kg   SpO2 97%   BMI 27.83 kg/m  SpO2: SpO2: 97 % O2 Device: O2 Device: Room Air O2 Flow Rate: O2 Flow Rate (L/min): 2 L/min  Intake/output summary:   Intake/Output Summary (Last 24 hours) at 08/23/2019 1548 Last data filed at 08/23/2019 0900 Gross per 24 hour  Intake -  Output 2098 ml  Net -2098 ml   LBM: Last BM Date: 08/22/19 Baseline Weight: Weight: 87.5 kg Most recent weight: Weight: 78.2 kg       Palliative Assessment/Data: PPS: 10%      Patient Active Problem List   Diagnosis Date Noted  . Cerebral embolism with cerebral infarction 08/22/2019  . Advanced care planning/counseling discussion   . Goals of care, counseling/discussion   . Palliative care by specialist   . Closed fracture of multiple ribs   . Malnutrition of moderate degree 08/16/2019  . Acute bacterial endocarditis   . Acute on chronic combined systolic and diastolic CHF (congestive heart failure) ([place])   . MSSA bacteremia 08/07/2019  . AKI (acute kidney injury) ([place])   . Elevated troponin   . Persistent atrial fibrillation ([place])   . Acute respiratory failure with hypoxia ([place])   . Cardiac arrest ([place]) 08/03/2019  . Pressure injury of skin 08/03/2019  . Atrial fibrillation with RVR ([place]) 01/25/2019  . CHF (congestive heart failure) ([place])  01/25/2019  . Hypoxia   . Renal insufficiency   . Renal disorder   . Hypertension     Palliative Care Assessment & Plan   Patient Profile: 73 y.o. female  with past medical history of CKD IV with unilateral kidney, CHF, a fib, HTN admitted on 08/03/2019 with cardiac arrest requiring intubation. Workup revealed urosepsis, MSSA bacteremia. Initial ECHO noted EF 20-25% TEE repeated and EF improved to 60%. Patient remains dysphagic s/p intubation, NPO. Has AKI superimposed on CKD, now requiring intermittent dialysis. Mental status has waxed and waned since extubation. MRI head today showing several small infarcts in frontal and occipital lobe- per neuro embolic vs watershed. Palliative medicine consulted for [place].   Assessment/Recommendations/Plan   Continue full scope, full code- dialysis  Code Status:  Full code  Prognosis:   Unable to determine  Discharge Planning:  To Be Determined  Care plan was discussed with patient's HCPOA- [name].  Thank you for allowing the Palliative Medicine Team to assist in the care of this patient.   Time In: 1400 Time Out: 1435 Total Time 35 mins Prolonged Time Billed no      Greater than 50%  of this time was spent counseling and coordinating care related to the above assessment and plan.  Mariana Kaufman, AGNP-C Palliative Medicine   Please contact Palliative Medicine Team phone at [phone] for questions and concerns.

## 2019-08-23 NOTE — Progress Notes (Signed)
Pt has not worn Bipap in several days.  No distress noted.  Sp02 97% on 2L [place]

## 2019-08-23 NOTE — Progress Notes (Signed)
PHARMACY CONSULT NOTE FOR:  OUTPATIENT  PARENTERAL ANTIBIOTIC THERAPY (OPAT)  Indication: MSSA bactermia and possible endocarditis Regimen: ancef 2gm IV with HD TTS End date: 09/18/2019  IV antibiotic discharge orders are pended. To discharging provider:  please sign these orders via discharge navigator,  Select New Orders & click on the button choice - Manage This Unsigned Work.     Thank you for allowing pharmacy to be a part of this patient's care.  Hildred Laser, PharmD Clinical Pharmacist **Pharmacist phone directory can now be found on [email or web address] ([email or web address]).  Listed under [place].

## 2019-08-23 NOTE — Progress Notes (Signed)
[place] for Heparin  Indication: atrial fibrillation  Allergies  Allergen Reactions  . Codeine Other (See Comments)    Reaction not recalled by family- was told to never take this    Patient Measurements: Height: 5\' 6"  (167.6 cm) Weight: 172 lb 6.4 oz (78.2 kg) IBW/kg (Calculated) : 59.3 Heparin Dosing Weight: 78 kg  Vital Signs: Temp: 98.8 F (37.1 C) (11/11 0511) Temp Source: Oral (11/11 0511) BP: 128/65 (11/11 0415) Pulse Rate: 118 (11/11 0415)  Labs: Recent Labs    08/21/19 1040 08/22/19 0448 08/22/19 1530 08/23/19 0500  HGB 7.0* 6.9*  --  7.8*  HCT 23.4*  21.4* 23.2*  --  25.1*  PLT 429* 457*  --  386  HEPARINUNFRC 0.39 0.18* 0.31 <0.10*  CREATININE 2.62* 3.40*  --   --     Estimated Creatinine Clearance: 15.8 mL/min (A) (by C-G formula based on SCr of 3.4 mg/dL (H)).  Assessment: Pt is a 73 y/o female admitted s/p out of hospital cardiac arrest. ROSC was achieved and targeted temperature management was initiated. This admission has been complicated by continued encephalopathy, AKI on CKD which has advanced to dialysis, and hypotension.    MRI on 11/7 found several small acute infarcts though no hemorrhage.. Pt continues on heparin for Afib. Plans to change back to apixaban when taking po (needs to pass swallow evaluation) -heparin level < 0.1 (was 0.31 on 11/11), RN reports the infusion was leaking -Hg =7.8   Goal of Therapy:  Heparin level 0.3-0.5 units/ml Monitor platelets by anticoagulation protocol: Yes   Plan:  -Increase heparin to 1050 units/hr small change since patient has previously been at goal on [phone] units/hr) -Heparin level in 8 hours and daily wth CBC daily   Hildred Laser, PharmD Clinical Pharmacist **Pharmacist phone directory can now be found on [email or web address] ([email or web address]).  Listed under [place].

## 2019-08-23 NOTE — TOC Progression Note (Signed)
Transition of Care [facility]) - Progression Note    Patient Details  Name: Jaclyn Day MRN: [id number] Date of Birth: [birth year]  [facility] CM/SW Contact  Graves-Bigelow, Ocie Cornfield, RN Phone Number: 08/23/2019, 4:40 PM  Clinical Narrative:  CM contacted MD to see if we can explore [facility]- MD/ NP agreeable. CM did call [facility] POA [name] - undecided at this time- we will discuss in am regarding transition of care needs. CM will have Renal Navigator to assist in conversation as well.   Expected Discharge Plan: Long Term Acute Care (LTAC) Barriers to Discharge: Continued Medical Work up  Expected Discharge Plan and Services Expected Discharge Plan: [place] (LTAC) In-house Referral: NA Discharge Planning Services: CM Consult Post Acute Care Choice: [place] Living arrangements for the past 2 months: Single Family Home                   Social Determinants of Health (SDOH) Interventions    Readmission Risk Interventions Readmission Risk Prevention Plan 08/08/2019  Transportation Screening Complete  PCP or Specialist Appt within 3-5 Days Complete  HRI or Home Care Consult Complete  Medication Review (RN Care Manager) Complete  Some recent data might be hidden

## 2019-08-23 NOTE — Progress Notes (Signed)
[facility] NEPHROLOGY PROGRESS NOTE  Assessment/ Plan: Pt is a 73 y.o. yo female with out of hospital cardiac arrest CPR for 10 minutes, history of CKD stage IV, CHF.  She was a started dialysis on 10/25 via right IJ catheter.  Urine culture with E. coli, CRRT initiated on 10/30/120.  #AKI on CKD stage IV: CKD due to hypertension.  AKI likely ATN in the setting of shock, cardiac arrest and bacteremia.  She was on CRRT since 10/30-11/1.   -Kidney ultrasound showed solitary right kidney with cortical thinning and cysts. -Seen by palliative, full scope of care - Cont iHD THS Schedule: 2L, 3.5h, no hep, 2K bath, Temp HD.  Given prolonged dialysis dependence, likely to be ESRD, will consult IR for tunneled catheter and likely will need permanent access prior to discharge.  #Hyperkalemia: Cont HD  #Acute encephalopathy: Appears encephalopathic today.  11/7 MRI with scattered small acute infarcts and chronic infarcts noted.  #Cardiac arrest, acute on chronic systolic CHF: EF [id number] by echo, cardiology is following.  EF improved in TEE.  #MSSA bacteremia: Continue cefazolin. End date six weeks from last negative culture 08/07/19 - 09/18/19.  #Hypotension: Currently off pressor.  Blood pressure is better.  Continue midodrine.  #Acute respiratory failure with hypoxia: HD today with UF.  On 2 L oxygen.  #Paroxysmal atrial fibrillation: Cardiology following.  On amiodarone and heparin drip.  Subjective:   HD yesterday, 1.9 L ultrafiltration  In chair this morning, remains confused  No recorded UOP  Still using temporary HD catheter  Blood culture 10/29 x 2, no growth final  Objective Vital signs in last 24 hours: Vitals:   08/23/19 0345 08/23/19 0415 08/23/19 0511 08/23/19 0817  BP: 119/89 128/65  130/72  Pulse: (!) 104 (!) 118  (!) 119  Resp: 16 (!) 23  (!) 31  Temp:   98.8 F (37.1 C)   TempSrc:   Oral   SpO2: 99% 98%  98%  Weight:   78.2 kg   Height:        Weight change:   Intake/Output Summary (Last 24 hours) at 08/23/2019 1314 Last data filed at 08/23/2019 0900 Gross per 24 hour  Intake -  Output 2098 ml  Net -2098 ml       Labs: Basic Metabolic Panel: Recent Labs  Lab 08/21/19 1040 08/22/19 0448 08/23/19 0500 08/23/19 1154  NA 137 135  --  135  K 4.9 5.5*  --  4.6  CL 99 99  --  95*  CO2 28 26  --  28  GLUCOSE 115* 126*  --  148*  BUN 37* 49*  --  30*  CREATININE 2.62* 3.40*  --  2.30*  CALCIUM 8.8* 9.0  --  8.9  PHOS 4.1 4.6 3.0  --    Liver Function Tests: Recent Labs  Lab 08/18/19 1931  AST 10*  ALT <5  ALKPHOS 91  BILITOT 0.6  PROT 5.1*  ALBUMIN 1.7*   No results for input(s): LIPASE, AMYLASE in the last 168 hours. Recent Labs  Lab 08/20/19 1427  AMMONIA <9*   CBC: Recent Labs  Lab 08/19/19 0420 08/20/19 0523 08/21/19 1040 08/22/19 0448 08/23/19 0500  WBC 17.6* 18.0* 16.6* 15.6* 15.6*  HGB 7.7* 7.3* 7.0* 6.9* 7.8*  HCT 25.9* 24.3* 23.4*  21.4* 23.2* 25.1*  MCV 94.9 94.6 95.5 96.7 94.0  PLT 409* 424* 429* 457* 386   Cardiac Enzymes: No results for input(s): CKTOTAL, CKMB, CKMBINDEX, TROPONINI in the last  168 hours. CBG: Recent Labs  Lab 08/22/19 2044 08/22/19 2352 08/23/19 0513 08/23/19 0731 08/23/19 1118  GLUCAP 156* 124* 131* 136* 127*    Iron Studies:  Recent Labs    08/21/19 1040  IRON 12*  TIBC 189*  FERRITIN 320*   Studies/Results: No results found.  Medications: Infusions: . sodium chloride 10 mL (08/20/19 2025)  . sodium chloride    . sodium chloride    . amiodarone    . amiodarone    . [START ON 08/24/2019]  ceFAZolin (ANCEF) IV    . feeding supplement (OSMOLITE 1.5 CAL) 1,000 mL (08/22/19 2231)  . heparin 1,050 Units/hr (08/23/19 0829)  . valproate sodium 250 mg (08/23/19 1148)    Scheduled Medications: .  stroke: mapping our early stages of recovery book   Does not apply Once  . sodium chloride   Intravenous Once  . ARIPiprazole  10 mg Per Tube  BID  . atorvastatin  20 mg Per NG tube Daily  . chlorhexidine  15 mL Mouth Rinse BID  . Chlorhexidine Gluconate Cloth  6 each Topical Q0600  . Chlorhexidine Gluconate Cloth  6 each Topical Q0600  . feeding supplement (PRO-STAT SUGAR FREE 64)  30 mL Per Tube Daily  . ferrous sulfate  300 mg Per Tube Q breakfast  . furosemide  80 mg Intravenous BID  . insulin aspart  0-9 Units Subcutaneous Q4H  . mouth rinse  15 mL Mouth Rinse BID  . midodrine  10 mg Per Tube TID WC  . pantoprazole (PROTONIX) IV  40 mg Intravenous Q24H  . sodium chloride flush  10-40 mL Intracatheter Q12H  . Thrombi-Pad  1 each Topical Once    have reviewed scheduled and prn medications.  Physical Exam: General: Alert awake but remains encephalopathic, unchanged Heart:RRR, s1s2 nl, no rubs Lungs: Bibasal crackles, no increased work of breathing Abdomen:soft, Non-tender, non-distended Extremities: Trace edema Dialysis Access: Right IJ temporary catheter, site clean  Rexene Agent 08/23/2019,1:14 PM  LOS: 20 days  Pager: ID:[phone]

## 2019-08-23 NOTE — Procedures (Signed)
Pre-procedure Diagnosis: ESRD Post-procedure Diagnosis: Same  Successful placement of tunneled HD catheter with tips terminating within the superior aspect of the right atrium.    Complications: None Immediate  EBL: Minimal   The catheter is ready for immediate use.   Jay Samanta Gal, MD Pager #: [phone]   

## 2019-08-23 NOTE — Progress Notes (Signed)
PROGRESS NOTE    Jaclyn Day  BTC:[id number] DOB: 1946/06/18 DOA: 08/03/2019 PCP: Cyndi Bender, PA-C    Brief Narrative:  Patient is 73 year old with history of congenital unilateral kidney, stage IV chronic kidney disease with baseline creatinine about 2.2-2.8, hypertension, persistent A. fib on anticoagulation with Eliquis, chronic diastolic heart failure, history of cardioversion for A. fib admitted to [facility] witnessedcardiac arrest. Patient from home. 10/22 Admitted post arrest, intubated, initiation of targeted temperature management 36 celsius.  ECHO: LVEF: 20-25%, global hypokinesis, RV midly reduced function with normal size, mild MR, normal pulmonary pressures. Lactate cleared 10/25 - staph identified in blood and vanc started. Still with poor Ur OP. On amio gtt. Off leveophed and neoOn Heparin gtt. iHD w/ 1L off 10/26- following simple commands, placed on precedex for agitation, febrile- re-cultured and femoral CVL discontinued  10/28- converted to NSR, TEE  10/30 - CRRT for volume removal Blood culture 10/22 >>MSSA 1/4 COVID 10/22 >> Negative RVP 10/22 >> Negative Urine 10/22 - E colii 10/26 BCx 2 >> neg Remains in very poor mentation.      Consultants:   Nephrology, interventional radiology, neurology, palliative care  Procedures:placement of tunneled HD catheter, CRRT, HD Echo 08/21/19 IMPRESSIONS  1. Left ventricular ejection fraction, by visual estimation, is 55 to 60%. The left ventricle has normal function. There is moderately increased left ventricular hypertrophy. 2. Left ventricular diastolic parameters are indeterminate. 3. Global right ventricle has normal systolic function.The right ventricular size is normal. No increase in right ventricular wall thickness. 4. Left atrial size was moderately dilated. 5. Right atrial size was mildly dilated. 6. Trivial pericardial effusion is present. 7. The mitral valve is normal in  structure. Trace mitral valve regurgitation. 8. The tricuspid valve is normal in structure. Tricuspid valve regurgitation is mild. 9. The aortic valve is tricuspid. Aortic valve regurgitation is not visualized. 10. The pulmonic valve was not well visualized. Pulmonic valve regurgitation is not visualized. 11. The tricuspid regurgitant velocity is 2.79 m/s, and with an assumed right atrial pressure of 8 mmHg, the estimated right ventricular systolic pressure is mildly elevated at 39.1 mmHg. 12. The inferior vena cava is normal in size with <50% respiratory variability, suggesting right atrial pressure of 8 mmHg. 13. The interatrial septum was not well visualized.  Antimicrobials:  Rocephin 10/22 >> 10/25 Vanc (staph in blood ) 10/24  Cefazolin 10/25 >>   Subjective: Patient seen and examined today.  She has no complaints.  Denies shortness of breath or chest pain.  Monitor afib rvr  Objective: Vitals:   08/23/19 1323 08/23/19 1503 08/23/19 1559 08/23/19 1619  BP: 130/77 121/80 111/87 (!) 141/87  Pulse: (!) 103 98 (!) 109 (!) 113  Resp: (!) '22  16 15  ' Temp: 98 F (36.7 C) 98.4 F (36.9 C)    TempSrc:  Oral    SpO2: 97% 97% 100% 99%  Weight:      Height:        Intake/Output Summary (Last 24 hours) at 08/23/2019 1816 Last data filed at 08/23/2019 0900 Gross per 24 hour  Intake --  Output 150 ml  Net -150 ml   Filed Weights   08/21/19 0500 08/22/19 1410 08/23/19 0511  Weight: 74.7 kg 88.9 kg 78.2 kg    Examination:  General exam: Appears calm and comfortable  Respiratory system: Clear to auscultation. Respiratory effort normal. Cardiovascular system: S1 & S2 heard, RRR. No JVD, murmurs, rubs, gallops or clicks. No pedal edema. Gastrointestinal system: Abdomen is nondistended,  soft and nontender. No organomegaly or masses felt. Normal bowel sounds heard. Central nervous system: Alert and oriented. No focal neurological deficits. Extremities: Symmetric 5 x 5  power. Skin: No rashes, lesions or ulcers Psychiatry: Judgement and insight appear normal. Mood & affect appropriate.     Data Reviewed: I have personally reviewed following labs and imaging studies  CBC: Recent Labs  Lab 08/19/19 0420 08/20/19 0523 08/21/19 1040 08/22/19 0448 08/23/19 0500  WBC 17.6* 18.0* 16.6* 15.6* 15.6*  HGB 7.7* 7.3* 7.0* 6.9* 7.8*  HCT 25.9* 24.3* 23.4*   21.4* 23.2* 25.1*  MCV 94.9 94.6 95.5 96.7 94.0  PLT 409* 424* 429* 457* 366   Basic Metabolic Panel: Recent Labs  Lab 08/17/19 0222 08/18/19 0249  08/19/19 0420 08/20/19 0523 08/21/19 1040 08/22/19 0448 08/23/19 0500 08/23/19 1154  NA 135 134*   < > 134* 135 137 135  --  135  K 4.6 5.5*   < > 5.4* 6.0* 4.9 5.5*  --  4.6  CL 98 99   < > 97* 98 99 99  --  95*  CO2 24 24   < > '25 24 28 26  ' --  28  GLUCOSE 115* 119*   < > 100* 135* 115* 126*  --  148*  BUN 47* 67*   < > 66* 79* 37* 49*  --  30*  CREATININE 2.99* 3.70*   < > 3.46* 3.84* 2.62* 3.40*  --  2.30*  CALCIUM 8.4* 8.3*   < > 8.6* 8.6* 8.8* 9.0  --  8.9  MG 2.2 2.2  --   --   --   --   --   --  2.0  PHOS 4.6 4.8*  --  5.2* 5.1* 4.1 4.6 3.0  --    < > = values in this interval not displayed.   GFR: Estimated Creatinine Clearance: 23.4 mL/min (A) (by C-G formula based on SCr of 2.3 mg/dL (H)). Liver Function Tests: Recent Labs  Lab 08/18/19 1931  AST 10*  ALT <5  ALKPHOS 91  BILITOT 0.6  PROT 5.1*  ALBUMIN 1.7*   No results for input(s): LIPASE, AMYLASE in the last 168 hours. Recent Labs  Lab 08/20/19 1427  AMMONIA <9*   Coagulation Profile: No results for input(s): INR, PROTIME in the last 168 hours. Cardiac Enzymes: No results for input(s): CKTOTAL, CKMB, CKMBINDEX, TROPONINI in the last 168 hours. BNP (last 3 results) No results for input(s): PROBNP in the last 8760 hours. HbA1C: No results for input(s): HGBA1C in the last 72 hours. CBG: Recent Labs  Lab 08/22/19 2044 08/22/19 2352 08/23/19 0513 08/23/19 0731  08/23/19 1118  GLUCAP 156* 124* 131* 136* 127*   Lipid Profile: No results for input(s): CHOL, HDL, LDLCALC, TRIG, CHOLHDL, LDLDIRECT in the last 72 hours. Thyroid Function Tests: No results for input(s): TSH, T4TOTAL, FREET4, T3FREE, THYROIDAB in the last 72 hours. Anemia Panel: Recent Labs    08/21/19 1040  VITAMINB12 393  FERRITIN 320*  TIBC 189*  IRON 12*   Sepsis Labs: No results for input(s): PROCALCITON, LATICACIDVEN in the last 168 hours.  No results found for this or any previous visit (from the past 240 hour(s)).       Radiology Studies: Ir Fluoro Guide Cv Line Right  Result Date: 08/23/2019 INDICATION: End-stage renal disease. In need of durable intravenous access for the continuation of dialysis. EXAM: TUNNELED CENTRAL VENOUS HEMODIALYSIS CATHETER PLACEMENT WITH ULTRASOUND AND FLUOROSCOPIC GUIDANCE MEDICATIONS: Ancef 2 gm IV .  The antibiotic was given in an appropriate time interval prior to skin puncture. ANESTHESIA/SEDATION: Fentanyl 25 mcg IV; Moderate Sedation Time:  10 minutes The patient was continuously monitored during the procedure by the interventional radiology nurse under my direct supervision. FLUOROSCOPY TIME:  12 seconds (2 mGy) COMPLICATIONS: None immediate. PROCEDURE: Informed written consent was obtained from the the patient's husband after a discussion of the risks, benefits, and alternatives to treatment. Questions regarding the procedure were encouraged and answered. The existing right internal jugular approach temporary dialysis catheter (previously placed at the patient's bedside, was removed and superficial hemostasis was achieved with manual compression. Next, the right neck and chest were prepped with chlorhexidine in a sterile fashion, and a sterile drape was applied covering the operative field. Maximum barrier sterile technique with sterile gowns and gloves were used for the procedure. A timeout was performed prior to the initiation of the  procedure. After creating a small venotomy incision, a micropuncture kit was utilized to access the internal jugular vein. Real-time ultrasound guidance was utilized for vascular access including the acquisition of a permanent ultrasound image documenting patency of the accessed vessel. The microwire was utilized to measure appropriate catheter length. A stiff Glidewire was advanced to the level of the IVC and the micropuncture sheath was exchanged for a peel-away sheath. A palindrome tunneled hemodialysis catheter measuring 19 cm from tip to cuff was tunneled in a retrograde fashion from the anterior chest wall to the venotomy incision. The catheter was then placed through the peel-away sheath with tips ultimately positioned within the superior aspect of the right atrium. Final catheter positioning was confirmed and documented with a spot radiographic image. The catheter aspirates and flushes normally. The catheter was flushed with appropriate volume heparin dwells. The catheter exit site was secured with a 0-Prolene retention suture. The venotomy incision was closed with Dermabond and Steri-strips. Dressings were applied. The patient tolerated the procedure well without immediate post procedural complication. IMPRESSION: Successful removal of existing non tunneled right jugular approach temporary dialysis catheter and placement of new 19 cm tip to cuff tunneled hemodialysis catheter via the right internal jugular vein with tips terminating within the superior aspect of the right atrium. The catheter is ready for immediate use. Electronically Signed   By: Sandi Mariscal M.D.   On: 08/23/2019 16:42   Ir US Guide Vasc Access Right  Result Date: 08/23/2019 INDICATION: End-stage renal disease. In need of durable intravenous access for the continuation of dialysis. EXAM: TUNNELED CENTRAL VENOUS HEMODIALYSIS CATHETER PLACEMENT WITH ULTRASOUND AND FLUOROSCOPIC GUIDANCE MEDICATIONS: Ancef 2 gm IV . The antibiotic was  given in an appropriate time interval prior to skin puncture. ANESTHESIA/SEDATION: Fentanyl 25 mcg IV; Moderate Sedation Time:  10 minutes The patient was continuously monitored during the procedure by the interventional radiology nurse under my direct supervision. FLUOROSCOPY TIME:  12 seconds (2 mGy) COMPLICATIONS: None immediate. PROCEDURE: Informed written consent was obtained from the the patient's husband after a discussion of the risks, benefits, and alternatives to treatment. Questions regarding the procedure were encouraged and answered. The existing right internal jugular approach temporary dialysis catheter (previously placed at the patient's bedside, was removed and superficial hemostasis was achieved with manual compression. Next, the right neck and chest were prepped with chlorhexidine in a sterile fashion, and a sterile drape was applied covering the operative field. Maximum barrier sterile technique with sterile gowns and gloves were used for the procedure. A timeout was performed prior to the initiation of the procedure. After creating  a small venotomy incision, a micropuncture kit was utilized to access the internal jugular vein. Real-time ultrasound guidance was utilized for vascular access including the acquisition of a permanent ultrasound image documenting patency of the accessed vessel. The microwire was utilized to measure appropriate catheter length. A stiff Glidewire was advanced to the level of the IVC and the micropuncture sheath was exchanged for a peel-away sheath. A palindrome tunneled hemodialysis catheter measuring 19 cm from tip to cuff was tunneled in a retrograde fashion from the anterior chest wall to the venotomy incision. The catheter was then placed through the peel-away sheath with tips ultimately positioned within the superior aspect of the right atrium. Final catheter positioning was confirmed and documented with a spot radiographic image. The catheter aspirates and flushes  normally. The catheter was flushed with appropriate volume heparin dwells. The catheter exit site was secured with a 0-Prolene retention suture. The venotomy incision was closed with Dermabond and Steri-strips. Dressings were applied. The patient tolerated the procedure well without immediate post procedural complication. IMPRESSION: Successful removal of existing non tunneled right jugular approach temporary dialysis catheter and placement of new 19 cm tip to cuff tunneled hemodialysis catheter via the right internal jugular vein with tips terminating within the superior aspect of the right atrium. The catheter is ready for immediate use. Electronically Signed   By: Sandi Mariscal M.D.   On: 08/23/2019 16:42        Scheduled Meds:   stroke: mapping our early stages of recovery book   Does not apply Once   sodium chloride   Intravenous Once   ARIPiprazole  10 mg Per Tube BID   atorvastatin  20 mg Per NG tube Daily   chlorhexidine  15 mL Mouth Rinse BID   Chlorhexidine Gluconate Cloth  6 each Topical Q0600   Chlorhexidine Gluconate Cloth  6 each Topical Q0600   feeding supplement (PRO-STAT SUGAR FREE 64)  30 mL Per Tube Daily   fentaNYL       ferrous sulfate  300 mg Per Tube Q breakfast   furosemide  80 mg Intravenous BID   heparin       insulin aspart  0-9 Units Subcutaneous Q4H   lidocaine-EPINEPHrine       mouth rinse  15 mL Mouth Rinse BID   midodrine  10 mg Per Tube TID WC   pantoprazole (PROTONIX) IV  40 mg Intravenous Q24H   sodium chloride flush  10-40 mL Intracatheter Q12H   Thrombi-Pad  1 each Topical Once   Continuous Infusions:  sodium chloride 10 mL (08/20/19 2025)   sodium chloride     sodium chloride     amiodarone 60 mg/hr (08/23/19 1513)   amiodarone 30 mg/hr (08/23/19 1752)   [START ON 08/24/2019]  ceFAZolin (ANCEF) IV 2 g (08/23/19 1551)   feeding supplement (OSMOLITE 1.5 CAL) 1,000 mL (08/22/19 2231)   heparin 1,050 Units/hr (08/23/19  0829)   valproate sodium 250 mg (08/23/19 1750)    Assessment & Plan:   Principal Problem:   MSSA bacteremia Active Problems:   Cardiac arrest ([place])   Pressure injury of skin   Acute respiratory failure with hypoxia (HCC)   AKI (acute kidney injury) ([place])   Elevated troponin   Persistent atrial fibrillation (HCC)   Acute on chronic combined systolic and diastolic CHF (congestive heart failure) (HCC)   Acute bacterial endocarditis   Malnutrition of moderate degree   Advanced care planning/counseling discussion   Goals of care, counseling/discussion  Palliative care by specialist   Closed fracture of multiple ribs   Cerebral embolism with cerebral infarction   Acute hypoxic respiratory failure due to cardiac arrest and pulmonary edema/bilateral pleural effusions:  Presented after out of hospital cardiac arrest.   -Was intubated ands/p extubated, on nasal cannula O2 -Continue chest physiotherapy and incentive spirometry if patient can tolerate. -Volume status management by nephrology through hemodialysis She is status post tunneled HD catheter placement  Improving acute metabolic encephalopathy suspect multifactorial with recent multiple acute CVA:In the setting of cardiac arrest hypothermia, MSSA bacteremia, prolonged hospitalization, versus others. CT head 08/16/2019 stable.  On 08/22/2019 mental status was improved . On valproic acid as longstanding medications.   EEG with no evidence of seizure activity. Palliative care team following. MRI done on 08/19/2019 showed several scattered small acute infarcts in the white matter of the superior frontal lobes, left occipital lobe.  No associated hemorrhage or mass-effect.  Also several small subacute or perhaps chronic infarcts in the bilateral cerebellum. Neurology following -TTE -no evidence of vegetation or thrombus. -CT head without contrast on 08/21/2019 no sign of intracranial acute hemorrhage or acute  ischemia.   Multiple acute/subacute CVA involving white matter of the superior frontal lobes and left occipital lobe No hemorrhage, on heparin drip, neurology following.  Okay to continue anticoagulation per neuro. -Continue with neuro checks  Witnessed cardiac arrest in the setting of history of chronic atrial fibrillation and coronary artery disease: Patient treated with hypothermia protocol.  Had TEE/DCCV earlier this year.  Overnight has gone back into atrial fibrillation with RVR.  Cardiology was reconsulted-recommend amnio bolus and may need higher dose of amiodarone.  Will need to transition from heparin to oral A/C due to CHA2DS2-VASc score of 6 (CHF, HTN, age, stroke, female)followed by cardiology.  Switch to Eliquis after final plans.   Acute on chronic systolic heart failure: TTE on 10/22 with EF 20%. Repeat TEE with improved ejection fraction to 60%. CAD evaluation pending renal improvement.  MSSA bacteremia Repeat cultures negative.  Seen by infectious disease.  Recommended cefazolin until 12/7. TTE no evidence of endocarditis or thrombus.  Acute blood loss anemia No sign of overt bleeding Hemoglobin 7.0 on 08/21/2019 Transfuse 1 unit PRBC at hemodialysis Repeat CBC in the morning  AKI on CKD stage IV: Suspected AKI likely ATN in the setting of shock ,cardiac arrest and bacteremia. Started on CRRT initially.Now on hemodialysis.  Nephrology following. Will need to move to [facility] Status post tunneled hemodialysis catheter placed.  Hyperkalemia Management per nephrology with HD  Diet: Currently on tube feeding via core track tube.  speech therapist following Continue aspiration precautions  Physical debility/ambulatory dysfunction PT OT -recommended SNF CSW consulted for assistance with SNF placement.  DVT prophylaxis:Heparin drip  Code Status:Full code Family Communication:  No one at bedside Disposition Plan: Possible discharge to SNF in  the next 48 to 72 hours once can tolerate oral intake.           LOS: 20 days   Time spent: 45 minutes with more than 50% spent on [place], MD [facility] Pager [phone]  If 7PM-7AM, please contact night-coverage [email or web address] Password [facility] 08/23/2019, 6:16 PM

## 2019-08-23 NOTE — H&P (Signed)
Patient Status: [facility] - In-pt  Assessment and Plan:  73 y/o F s/p cardiac arrest with AKI on CKD IV currently on hemodialysis via right IJ temporary catheter - request to IR for tunneled HD catheter placement for long term dialysis access. Discussed with patient's significant other/POA ([name] Stutts) at bedside today who agrees to tunneled HD catheter placement.   Will tentatively plan for [facility] placement this afternoon in IR, schedule permitting. If unable to place today will plan for [facility] placement tomorrow (11/12).   Risks and benefits discussed with the patient's POA including, but not limited to bleeding, infection, vascular injury, pneumothorax which may require chest tube placement, air embolism or even death.  All of the patient's POA's questions were answered, patient is agreeable to proceed.  Consent signed and in chart. ______________________________________________________________________   History of Present Illness: Jaclyn Day is a 73 y.o. female with past medical history significant for cardiac arrest s/p CPR, acute encephalopathy, paroxysmal a.fib, acute on chronic CHF, acute respiratory failure and AKI on CKD IV currently receiving hemodialysis via temporary right IJ catheter followed by nephrology - request has been made to IR for tunneled HD cathter placement for long term dialysis access.   Patient seen sitting up in chair, leaned over to the right side. Significant other [name] at bedside today who states that he discussed the tunneled HD catheter placement today with Dr. Joelyn Oms and he would like for [place] to proceed with placement.   Allergies and medications reviewed.   Review of Systems: A 12 point ROS discussed and pertinent positives are indicated in the HPI above.  All other systems are negative.  Review of Systems  Unable to perform ROS: Mental status change    Vital Signs: BP 130/77   Pulse (!) 103   Temp 98 F (36.7 C)   Resp (!) 22   Ht 5\' 6"   (1.676 m)   Wt 172 lb 6.4 oz (78.2 kg)   SpO2 97%   BMI 27.83 kg/m   Physical Exam Vitals signs and nursing note reviewed.  Constitutional:      Appearance: She is ill-appearing.  Cardiovascular:     Rate and Rhythm: Normal rate.     Comments: (+) temp right IJ catheter  Pulmonary:     Effort: Pulmonary effort is normal.  Skin:    General: Skin is warm and dry.  Neurological:     Mental Status: She is alert. She is disoriented.      Imaging reviewed.   Labs:  COAGS: Recent Labs    08/03/19 1147 08/03/19 2000  08/04/19 0900 08/05/19 0802 08/06/19 0203 08/07/19 0327  INR 1.4* 1.5*  --   --   --   --   --   APTT 23*  --    < > 89* 66* 92* 69*   < > = values in this interval not displayed.    BMP: Recent Labs    08/20/19 0523 08/21/19 1040 08/22/19 0448 08/23/19 1154  NA 135 137 135 135  K 6.0* 4.9 5.5* 4.6  CL 98 99 99 95*  CO2 24 28 26 28   GLUCOSE 135* 115* 126* 148*  BUN 79* 37* 49* 30*  CALCIUM 8.6* 8.8* 9.0 8.9  CREATININE 3.84* 2.62* 3.40* 2.30*  GFRNONAA 11* 18* 13* 21*  GFRAA 13* 20* 15* 24*     Electronically Signed: Joaquim Nam, PA-C 08/23/2019, 2:33 PM   I spent a total of 15 minutes in face to face in  clinical consultation, greater than 50% of which was counseling/coordinating care for venous access.
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
## 2019-08-23 NOTE — Progress Notes (Addendum)
Progress Note  Patient Name: Jaclyn Day Date of Encounter: 08/23/2019  Primary Cardiologist: Ena Dawley, MD   Subjective   Patient is awake and alert and sitting up in a chair. Not very talkative with me, but nurse reports some normal conversation. Denies chest pain.   Inpatient Medications    Scheduled Meds: .  stroke: mapping our early stages of recovery book   Does not apply Once  . sodium chloride   Intravenous Once  . amiodarone  400 mg Per Tube Daily  . ARIPiprazole  10 mg Per Tube BID  . atorvastatin  20 mg Per NG tube Daily  . chlorhexidine  15 mL Mouth Rinse BID  . Chlorhexidine Gluconate Cloth  6 each Topical Q0600  . Chlorhexidine Gluconate Cloth  6 each Topical Q0600  . feeding supplement (PRO-STAT SUGAR FREE 64)  30 mL Per Tube Daily  . ferrous sulfate  300 mg Per Tube Q breakfast  . insulin aspart  0-9 Units Subcutaneous Q4H  . mouth rinse  15 mL Mouth Rinse BID  . midodrine  10 mg Per Tube TID WC  . pantoprazole (PROTONIX) IV  40 mg Intravenous Q24H  . sodium chloride flush  10-40 mL Intracatheter Q12H  . Thrombi-Pad  1 each Topical Once   Continuous Infusions: . sodium chloride 10 mL (08/20/19 2025)  . sodium chloride    . sodium chloride    . [START ON 08/24/2019]  ceFAZolin (ANCEF) IV    . feeding supplement (OSMOLITE 1.5 CAL) 1,000 mL (08/22/19 2231)  . heparin 1,050 Units/hr (08/23/19 0829)  . valproate sodium 250 mg (08/23/19 1148)   PRN Meds: sodium chloride, sodium chloride, acetaminophen (TYLENOL) oral liquid 160 mg/5 mL, alteplase, heparin, hydrALAZINE, HYDROmorphone (DILAUDID) injection, levalbuterol, lidocaine (PF), lidocaine-prilocaine, pentafluoroprop-tetrafluoroeth, sodium chloride flush   Vital Signs    Vitals:   08/23/19 0225 08/23/19 0345 08/23/19 0415 08/23/19 0511  BP:  119/89 128/65   Pulse: 94 (!) 104 (!) 118   Resp: (!) 25 16 (!) 23   Temp:    98.8 F (37.1 C)  TempSrc:    Oral  SpO2: 99% 99% 98%   Weight:     78.2 kg  Height:        Intake/Output Summary (Last 24 hours) at 08/23/2019 1220 Last data filed at 08/23/2019 0900 Gross per 24 hour  Intake -  Output 2099 ml  Net -2099 ml   Last 3 Weights 08/23/2019 08/22/2019 08/21/2019  Weight (lbs) 172 lb 6.4 oz 195 lb 15.8 oz 164 lb 10.9 oz  Weight (kg) 78.2 kg 88.9 kg 74.7 kg      Telemetry    Afib, HR 100-120s, peak 140s, occasional PVCs - Personally Reviewed  ECG    pending - Personally Reviewed  Physical Exam   GEN: No acute distress.   Neck: No JVD Cardiac: Irreg Irreg, no murmurs, rubs, or gallops.  Respiratory: Clear to auscultation bilaterally. GI: Soft, nontender, non-distended  MS: No edema; No deformity. Neuro:  Nonfocal  Psych: Normal affect   Labs    High Sensitivity Troponin:   Recent Labs  Lab 08/03/19 1013 08/03/19 1147 08/03/19 1550 08/04/19 0934  TROPONINIHS 48* 715* 2,793* 3,543*      Chemistry Recent Labs  Lab 08/18/19 1931  08/20/19 0523 08/21/19 1040 08/22/19 0448  NA 133*   < > 135 137 135  K 5.9*   < > 6.0* 4.9 5.5*  CL 98   < > 98 99 99  CO2 23   < > 24 28 26   GLUCOSE 151*   < > 135* 115* 126*  BUN 80*   < > 79* 37* 49*  CREATININE 3.99*   < > 3.84* 2.62* 3.40*  CALCIUM 8.4*   < > 8.6* 8.8* 9.0  PROT 5.1*  --   --   --   --   ALBUMIN 1.7*  --   --   --   --   AST 10*  --   --   --   --   ALT <5  --   --   --   --   ALKPHOS 91  --   --   --   --   BILITOT 0.6  --   --   --   --   GFRNONAA 11*   < > 11* 18* 13*  GFRAA 12*   < > 13* 20* 15*  ANIONGAP 12   < > 13 10 10    < > = values in this interval not displayed.     Hematology Recent Labs  Lab 08/21/19 1040 08/22/19 0448 08/23/19 0500  WBC 16.6* 15.6* 15.6*  RBC 2.45* 2.40* 2.67*  HGB 7.0* 6.9* 7.8*  HCT 23.4*  21.4* 23.2* 25.1*  MCV 95.5 96.7 94.0  MCH 28.6 28.8 29.2  MCHC 29.9* 29.7* 31.1  RDW 17.4* 17.2* 17.2*  PLT 429* 457* 386    BNPNo results for input(s): BNP, PROBNP in the last 168 hours.   DDimer No  results for input(s): DDIMER in the last 168 hours.   Radiology    No results found.  Cardiac Studies   Echo 08/21/19 IMPRESSIONS   1. Left ventricular ejection fraction, by visual estimation, is 55 to 60%. The left ventricle has normal function. There is moderately increased left ventricular hypertrophy.  2. Left ventricular diastolic parameters are indeterminate.  3. Global right ventricle has normal systolic function.The right ventricular size is normal. No increase in right ventricular wall thickness.  4. Left atrial size was moderately dilated.  5. Right atrial size was mildly dilated.  6. Trivial pericardial effusion is present.  7. The mitral valve is normal in structure. Trace mitral valve regurgitation.  8. The tricuspid valve is normal in structure. Tricuspid valve regurgitation is mild.  9. The aortic valve is tricuspid. Aortic valve regurgitation is not visualized. 10. The pulmonic valve was not well visualized. Pulmonic valve regurgitation is not visualized. 11. The tricuspid regurgitant velocity is 2.79 m/s, and with an assumed right atrial pressure of 8 mmHg, the estimated right ventricular systolic pressure is mildly elevated at 39.1 mmHg. 12. The inferior vena cava is normal in size with <50% respiratory variability, suggesting right atrial pressure of 8 mmHg. 13. The interatrial septum was not well visualized.   Patient Profile     73 y.o. female with a pmh of one kidney, CKD stage 5, HTN, chronic afib on Eliquis, Chronic diastolic HF who was admitted to ICU after witness cardiac arrest requiring intubation. Was found to be in afib RVR placed on amio and IV heparin. Was subsequently extubated. Found to have MSSA bacteremia and multiple acute infarcts. Cardiology previously following. Now noted to be Afib RVR and cardiology once again seeing patient.   Assessment & Plan    Afib RVR/Paroxysmal afib Patient has h/o of afib with TEE/DCCV earlier this year. She was  admitted to ICU for cardiac arrest noted to be in afib RVR started on IV heparin and IV amio  drip. Patient subsequently converted and was transitioned to 400mg  daily. Overnight patient was noted to be back in afib RVr, rates 120s.  - Will start amio bolus - Potassium 5.5 (being treated with HD), TSH 3.55, check magnesium - continue heparin with plan to transition to Eliquis when able - Echo this admission showed EF 55-60%, RA and LA moderately dilated - CHADSVASC = 6 (CHF, HTN, age, stroke, female) - Monitor on telemetry - EKG pending - Patient might need higher maintenance dose of amiodarone.   Metabolic encephalopathy Multifactorial: post cardiac arrest, MSSA bacteremia, acute CVA - Mental status improving - EEG with no seizure activity - Neuro and palliative care following  Multiple acute infarcts in superior frontal lobes, left occipital lobe - MRI 11/7/ showed scattered small acute infarcts - Etiology possibly embolic in nature - neurology following  MSSA bacteremia - TEE showed possible vegetation/endocarditis - IV abx per ID recommendations  Acute on chronic systolic HF - TTE showed EF 20% - Repeat TEE with improved EF 60% - Planned for CAD evaluation at a later date - CXR 11/9 showed increasing pulmonary congestion and bilateral effusions consistent with CHF - On [place] O2 - volume status per HD  AKI/CKD stage 4 - started on CRRT and now on intermittent HD. Had HD yesterday - Nephrology following  HTN - norvasc and metoprolol home meds. On hold for hypotension needing vasopressor support - permissive hypertension per neurology  Hyperlipidemia - Lipitor 20 mg daily  For questions or updates, please contact [place] Please consult [email or web address] for contact info under     Signed, Ena Dawley, MD  08/23/2019, 12:20 PM    The patient was seen, examined and discussed with Cadence Ninfa Meeker, PA-C   and I agree with the above.   The patients HR is elevated, we  will bolus amiodarone 150 mg iv x1, continued by IV amiodarone drip, start lasix 80 mg IV BID x 2 doses, she appears fluid overloaded and needs to have more fluid removed during HD.   Ena Dawley, MD 08/23/2019

## 2019-08-24 DIAGNOSIS — B9561 Methicillin susceptible Staphylococcus aureus infection as the cause of diseases classified elsewhere: Secondary | ICD-10-CM | POA: Diagnosis not present

## 2019-08-24 DIAGNOSIS — R7881 Bacteremia: Secondary | ICD-10-CM | POA: Diagnosis not present

## 2019-08-24 DIAGNOSIS — I5043 Acute on chronic combined systolic (congestive) and diastolic (congestive) heart failure: Secondary | ICD-10-CM | POA: Diagnosis not present

## 2019-08-24 DIAGNOSIS — J9601 Acute respiratory failure with hypoxia: Secondary | ICD-10-CM | POA: Diagnosis not present

## 2019-08-24 LAB — GLUCOSE, CAPILLARY
Glucose-Capillary: 101 mg/dL — ABNORMAL HIGH (ref 70–99)
Glucose-Capillary: 120 mg/dL — ABNORMAL HIGH (ref 70–99)
Glucose-Capillary: 124 mg/dL — ABNORMAL HIGH (ref 70–99)
Glucose-Capillary: 131 mg/dL — ABNORMAL HIGH (ref 70–99)
Glucose-Capillary: 136 mg/dL — ABNORMAL HIGH (ref 70–99)
Glucose-Capillary: 146 mg/dL — ABNORMAL HIGH (ref 70–99)

## 2019-08-24 LAB — BASIC METABOLIC PANEL
Anion gap: 15 (ref 5–15)
BUN: 43 mg/dL — ABNORMAL HIGH (ref 8–23)
CO2: 24 mmol/L (ref 22–32)
Calcium: 9 mg/dL (ref 8.9–10.3)
Chloride: 91 mmol/L — ABNORMAL LOW (ref 98–111)
Creatinine, Ser: 3.13 mg/dL — ABNORMAL HIGH (ref 0.44–1.00)
GFR calc Af Amer: 16 mL/min — ABNORMAL LOW (ref >60)
GFR calc non Af Amer: 14 mL/min — ABNORMAL LOW (ref >60)
Glucose, Bld: 157 mg/dL — ABNORMAL HIGH (ref 70–99)
Potassium: 5.3 mmol/L — ABNORMAL HIGH (ref 3.5–5.1)
Sodium: 130 mmol/L — ABNORMAL LOW (ref 135–145)

## 2019-08-24 LAB — CBC
HCT: 25.8 % — ABNORMAL LOW (ref 36.0–46.0)
Hemoglobin: 8.2 g/dL — ABNORMAL LOW (ref 12.0–15.0)
MCH: 28.5 pg (ref 26.0–34.0)
MCHC: 31.8 g/dL (ref 30.0–36.0)
MCV: 89.6 fL (ref 80.0–100.0)
Platelets: 410 10*3/uL — ABNORMAL HIGH (ref 150–400)
RBC: 2.88 MIL/uL — ABNORMAL LOW (ref 3.87–5.11)
RDW: 16.7 % — ABNORMAL HIGH (ref 11.5–15.5)
WBC: 15.4 10*3/uL — ABNORMAL HIGH (ref 4.0–10.5)
nRBC: 0 % (ref 0.0–0.2)

## 2019-08-24 LAB — HEPARIN LEVEL (UNFRACTIONATED)
Heparin Unfractionated: 0.2 IU/mL — ABNORMAL LOW (ref 0.30–0.70)
Heparin Unfractionated: 0.47 IU/mL (ref 0.30–0.70)

## 2019-08-24 LAB — PHOSPHORUS: Phosphorus: 4.2 mg/dL (ref 2.5–4.6)

## 2019-08-24 MED ORDER — HEPARIN SODIUM (PORCINE) 1000 UNIT/ML IJ SOLN
INTRAMUSCULAR | Status: AC
  Filled 2019-08-24: qty 4

## 2019-08-24 NOTE — Progress Notes (Signed)
ANTICOAGULATION CONSULT NOTE - Follow Up Consult  Pharmacy Consult for heparin Indication: atrial fibrillation  Labs: Recent Labs    08/21/19 1040 08/22/19 0448 08/22/19 1530 08/23/19 0500 08/23/19 1154 08/24/19 0413  HGB 7.0* 6.9*  --  7.8*  --  8.2*  HCT 23.4*  21.4* 23.2*  --  25.1*  --  25.8*  PLT 429* 457*  --  386  --  410*  HEPARINUNFRC 0.39 0.18* 0.31 <0.10*  --  0.20*  CREATININE 2.62* 3.40*  --   --  2.30*  --     Assessment: 73yo female subtherapeutic on heparin after resumed post-IR; no gtt issues or signs of bleeding per RN.  Goal of Therapy:  Heparin level 0.3-0.7 units/ml   Plan:  Will increase heparin gtt slightly to 1100 units/hr and check level in 8 hours.    Wynona Neat, PharmD, BCPS  08/24/2019,5:28 AM

## 2019-08-24 NOTE — Progress Notes (Signed)
[facility] for Heparin  Indication: atrial fibrillation  Allergies  Allergen Reactions  . Codeine Other (See Comments)    Reaction not recalled by family- was told to never take this    Patient Measurements: Height: 5\' 6"  (167.6 cm) Weight: 172 lb 6.4 oz (78.2 kg) IBW/kg (Calculated) : 59.3 Heparin Dosing Weight: 78 kg  Vital Signs: Temp: 98.5 F (36.9 C) (11/12 1200) Temp Source: Axillary (11/12 1200) BP: 127/62 (11/12 1200)  Labs: Recent Labs    08/22/19 0448  08/23/19 0500 08/23/19 1154 08/24/19 0413 08/24/19 1119 08/24/19 1500  HGB 6.9*  --  7.8*  --  8.2*  --   --   HCT 23.2*  --  25.1*  --  25.8*  --   --   PLT 457*  --  386  --  410*  --   --   HEPARINUNFRC 0.18*   < > <0.10*  --  0.20*  --  0.47  CREATININE 3.40*  --   --  2.30*  --  3.13*  --    < > = values in this interval not displayed.    Estimated Creatinine Clearance: 17.2 mL/min (A) (by C-G formula based on SCr of 3.13 mg/dL (H)).  Assessment: Pt is a 73 yr old female admitted S/P out of hospital cardiac arrest. ROSC was achieved and targeted temperature management was initiated. This admission has been complicated by continued encephalopathy, AKI on CKD which has advanced to dialysis, and hypotension.    MRI on 11/7 found several small acute infarcts (possibly embolic in nature), though no hemorrhage. Pt continues on heparin for Afib. Plans to change back to apixaban when taking po (needs to pass swallow evaluation)  Heparin level drawn ~9 hrs after increasing heparin infusion rate to 1100 units/hr was 0.47 units/ml, which is within the goal range for this patient. H/H, platelets stable. Per RN, no issues with IV or bleeding observed.  Goal of Therapy:  Heparin level 0.3-0.5 units/ml Monitor platelets by anticoagulation protocol: Yes   Plan:  Continue heparin infusion at 1100 units/hr Check confirmatory heparin level in 8 hrs Monitor daily heparin level,  CBC Monitor for signs/symptoms of bleeding  Gillermina Hu, PharmD, BCPS, [facility] Clinical Pharmacist 08/24/19, 16:23 PM

## 2019-08-24 NOTE — Progress Notes (Signed)
PROGRESS NOTE    Jaclyn Day  VOH:[id number] DOB: 08/11/1946 DOA: 08/03/2019 PCP: Cyndi Bender, PA-C    Brief Narrative:  Patient is 73 year old with history of congenital unilateral kidney, stage IV chronic kidney disease with baseline creatinine about 2.2-2.8, hypertension, persistent A. fib on anticoagulation with Eliquis, chronic diastolic heart failure, history of cardioversion for A. fib admitted to [facility] witnessedcardiac arrest. Patient from home. 10/22 Admitted post arrest, intubated, initiation of targeted temperature management 36 celsius.  ECHO: LVEF: 20-25%, global hypokinesis, RV midly reduced function with normal size, mild MR, normal pulmonary pressures. Lactate cleared 10/25 - staph identified in blood and vanc started. Still with poor Ur OP. On amio gtt. Off leveophed and neoOn Heparin gtt. iHD w/ 1L off 10/26- following simple commands, placed on precedex for agitation, febrile- re-cultured and femoral CVL discontinued  10/28- converted to NSR, TEE  10/30 - CRRT for volume removal Blood culture 10/22 >>MSSA 1/4 COVID 10/22 >> Negative RVP 10/22 >> Negative Urine 10/22 - E colii 10/26 BCx 2 >> neg Remains in very poor mentation.     Consultants:   Nephrology, interventional radiology, neurology, palliative care  Procedures:  -placement of tunneled HD catheter, CRRT, HD  Echo 08/21/19 1. Left ventricular ejection fraction, by visual estimation, is 55 to 60%. The left ventricle has normal function. There is moderately increased left ventricular hypertrophy. 2. Left ventricular diastolic parameters are indeterminate. 3. Global right ventricle has normal systolic function.The right ventricular size is normal. No increase in right ventricular wall thickness. 4. Left atrial size was moderately dilated. 5. Right atrial size was mildly dilated. 6. Trivial pericardial effusion is present. 7. The mitral valve is normal in structure. Trace  mitral valve regurgitation. 8. The tricuspid valve is normal in structure. Tricuspid valve regurgitation is mild. 9. The aortic valve is tricuspid. Aortic valve regurgitation is not visualized. 10. The pulmonic valve was not well visualized. Pulmonic valve regurgitation is not visualized. 11. The tricuspid regurgitant velocity is 2.79 m/s, and with an assumed right atrial pressure of 8 mmHg, the estimated right ventricular systolic pressure is mildly elevated at 39.1 mmHg. 12. The inferior vena cava is normal in size with <50% respiratory variability, suggesting right atrial pressure of 8 mmHg. 13. The interatrial septum was not well visualized.  Antimicrobials:  Rocephin 10/22 >> 10/25 Vanc (staph in blood ) 10/24  Cefazolin 10/25 >>   Subjective: Patient seen and examined.  Tries to speak but unable to understand.  Does deny by moving her head but she does not have shortness of breath or chest pain. Monitor A. fib  Objective: Vitals:   08/24/19 0406 08/24/19 0408 08/24/19 0800 08/24/19 1200  BP: 121/69   127/62  Pulse: 88 91    Resp: (!) 38 (!) 29    Temp:   98.9 F (37.2 C) 98.5 F (36.9 C)  TempSrc:   Axillary Axillary  SpO2: 98% 97%    Weight: 78.2 kg     Height:        Intake/Output Summary (Last 24 hours) at 08/24/2019 1652 Last data filed at 08/24/2019 0407 Gross per 24 hour  Intake 456.01 ml  Output 150 ml  Net 306.01 ml   Filed Weights   08/22/19 1410 08/23/19 0511 08/24/19 0406  Weight: 88.9 kg 78.2 kg 78.2 kg    Examination:  General exam: Appears calm and comfortable  Respiratory system: Clear to auscultation. Respiratory effort normal. Cardiovascular system: S1 & S2 heard, RRR. No JVD, murmurs, rubs, gallops  or clicks. No pedal edema. Gastrointestinal system: Abdomen is nondistended, soft and nontender. No organomegaly or masses felt. Normal bowel sounds heard. Central nervous system: Alert and oriented. No focal neurological deficits. Extremities:  Symmetric 5 x 5 power. Skin: No rashes, lesions or ulcers Psychiatry: Judgement and insight appear normal. Mood & affect appropriate.     Data Reviewed: I have personally reviewed following labs and imaging studies  CBC: Recent Labs  Lab 08/20/19 0523 08/21/19 1040 08/22/19 0448 08/23/19 0500 08/24/19 0413  WBC 18.0* 16.6* 15.6* 15.6* 15.4*  HGB 7.3* 7.0* 6.9* 7.8* 8.2*  HCT 24.3* 23.4*   21.4* 23.2* 25.1* 25.8*  MCV 94.6 95.5 96.7 94.0 89.6  PLT 424* 429* 457* 386 115*   Basic Metabolic Panel: Recent Labs  Lab 08/18/19 0249  08/20/19 0523 08/21/19 1040 08/22/19 0448 08/23/19 0500 08/23/19 1154 08/24/19 0413 08/24/19 1119  NA 134*   < > 135 137 135  --  135  --  130*  K 5.5*   < > 6.0* 4.9 5.5*  --  4.6  --  5.3*  CL 99   < > 98 99 99  --  95*  --  91*  CO2 24   < > _0 --  28  --  24  GLUCOSE 119*   < > 135* 115* 126*  --  148*  --  157*  BUN 67*   < > 79* 37* 49*  --  30*  --  43*  CREATININE 3.70*   < > 3.84* 2.62* 3.40*  --  2.30*  --  3.13*  CALCIUM 8.3*   < > 8.6* 8.8* 9.0  --  8.9  --  9.0  MG 2.2  --   --   --   --   --  2.0  --   --   PHOS 4.8*   < > 5.1* 4.1 4.6 3.0  --  4.2  --    < > = values in this interval not displayed.   GFR: Estimated Creatinine Clearance: 17.2 mL/min (A) (by C-G formula based on SCr of 3.13 mg/dL (H)). Liver Function Tests: Recent Labs  Lab 08/18/19 1931  AST 10*  ALT <5  ALKPHOS 91  BILITOT 0.6  PROT 5.1*  ALBUMIN 1.7*   No results for input(s): LIPASE, AMYLASE in the last 168 hours. Recent Labs  Lab 08/20/19 1427  AMMONIA <9*   Coagulation Profile: No results for input(s): INR, PROTIME in the last 168 hours. Cardiac Enzymes: No results for input(s): CKTOTAL, CKMB, CKMBINDEX, TROPONINI in the last 168 hours. BNP (last 3 results) No results for input(s): PROBNP in the last 8760 hours. HbA1C: No results for input(s): HGBA1C in the last 72 hours. CBG: Recent Labs  Lab 08/23/19 2017 08/23/19 2359  08/24/19 0414 08/24/19 0810 08/24/19 1118  GLUCAP 148* 101* 131* 120* 146*   Lipid Profile: No results for input(s): CHOL, HDL, LDLCALC, TRIG, CHOLHDL, LDLDIRECT in the last 72 hours. Thyroid Function Tests: No results for input(s): TSH, T4TOTAL, FREET4, T3FREE, THYROIDAB in the last 72 hours. Anemia Panel: No results for input(s): VITAMINB12, FOLATE, FERRITIN, TIBC, IRON, RETICCTPCT in the last 72 hours. Sepsis Labs: No results for input(s): PROCALCITON, LATICACIDVEN in the last 168 hours.  No results found for this or any previous visit (from the past 240 hour(s)).       Radiology Studies: Ir Fluoro Guide Cv Line Right  Result Date: 08/23/2019 INDICATION: End-stage renal disease. In need of  durable intravenous access for the continuation of dialysis. EXAM: TUNNELED CENTRAL VENOUS HEMODIALYSIS CATHETER PLACEMENT WITH ULTRASOUND AND FLUOROSCOPIC GUIDANCE MEDICATIONS: Ancef 2 gm IV . The antibiotic was given in an appropriate time interval prior to skin puncture. ANESTHESIA/SEDATION: Fentanyl 25 mcg IV; Moderate Sedation Time:  10 minutes The patient was continuously monitored during the procedure by the interventional radiology nurse under my direct supervision. FLUOROSCOPY TIME:  12 seconds (2 mGy) COMPLICATIONS: None immediate. PROCEDURE: Informed written consent was obtained from the the patient's husband after a discussion of the risks, benefits, and alternatives to treatment. Questions regarding the procedure were encouraged and answered. The existing right internal jugular approach temporary dialysis catheter (previously placed at the patient's bedside, was removed and superficial hemostasis was achieved with manual compression. Next, the right neck and chest were prepped with chlorhexidine in a sterile fashion, and a sterile drape was applied covering the operative field. Maximum barrier sterile technique with sterile gowns and gloves were used for the procedure. A timeout was  performed prior to the initiation of the procedure. After creating a small venotomy incision, a micropuncture kit was utilized to access the internal jugular vein. Real-time ultrasound guidance was utilized for vascular access including the acquisition of a permanent ultrasound image documenting patency of the accessed vessel. The microwire was utilized to measure appropriate catheter length. A stiff Glidewire was advanced to the level of the IVC and the micropuncture sheath was exchanged for a peel-away sheath. A palindrome tunneled hemodialysis catheter measuring 19 cm from tip to cuff was tunneled in a retrograde fashion from the anterior chest wall to the venotomy incision. The catheter was then placed through the peel-away sheath with tips ultimately positioned within the superior aspect of the right atrium. Final catheter positioning was confirmed and documented with a spot radiographic image. The catheter aspirates and flushes normally. The catheter was flushed with appropriate volume heparin dwells. The catheter exit site was secured with a 0-Prolene retention suture. The venotomy incision was closed with Dermabond and Steri-strips. Dressings were applied. The patient tolerated the procedure well without immediate post procedural complication. IMPRESSION: Successful removal of existing non tunneled right jugular approach temporary dialysis catheter and placement of new 19 cm tip to cuff tunneled hemodialysis catheter via the right internal jugular vein with tips terminating within the superior aspect of the right atrium. The catheter is ready for immediate use. Electronically Signed   By: Sandi Mariscal M.D.   On: 08/23/2019 16:42   Ir US Guide Vasc Access Right  Result Date: 08/23/2019 INDICATION: End-stage renal disease. In need of durable intravenous access for the continuation of dialysis. EXAM: TUNNELED CENTRAL VENOUS HEMODIALYSIS CATHETER PLACEMENT WITH ULTRASOUND AND FLUOROSCOPIC GUIDANCE  MEDICATIONS: Ancef 2 gm IV . The antibiotic was given in an appropriate time interval prior to skin puncture. ANESTHESIA/SEDATION: Fentanyl 25 mcg IV; Moderate Sedation Time:  10 minutes The patient was continuously monitored during the procedure by the interventional radiology nurse under my direct supervision. FLUOROSCOPY TIME:  12 seconds (2 mGy) COMPLICATIONS: None immediate. PROCEDURE: Informed written consent was obtained from the the patient's husband after a discussion of the risks, benefits, and alternatives to treatment. Questions regarding the procedure were encouraged and answered. The existing right internal jugular approach temporary dialysis catheter (previously placed at the patient's bedside, was removed and superficial hemostasis was achieved with manual compression. Next, the right neck and chest were prepped with chlorhexidine in a sterile fashion, and a sterile drape was applied covering the operative field. Maximum  barrier sterile technique with sterile gowns and gloves were used for the procedure. A timeout was performed prior to the initiation of the procedure. After creating a small venotomy incision, a micropuncture kit was utilized to access the internal jugular vein. Real-time ultrasound guidance was utilized for vascular access including the acquisition of a permanent ultrasound image documenting patency of the accessed vessel. The microwire was utilized to measure appropriate catheter length. A stiff Glidewire was advanced to the level of the IVC and the micropuncture sheath was exchanged for a peel-away sheath. A palindrome tunneled hemodialysis catheter measuring 19 cm from tip to cuff was tunneled in a retrograde fashion from the anterior chest wall to the venotomy incision. The catheter was then placed through the peel-away sheath with tips ultimately positioned within the superior aspect of the right atrium. Final catheter positioning was confirmed and documented with a spot  radiographic image. The catheter aspirates and flushes normally. The catheter was flushed with appropriate volume heparin dwells. The catheter exit site was secured with a 0-Prolene retention suture. The venotomy incision was closed with Dermabond and Steri-strips. Dressings were applied. The patient tolerated the procedure well without immediate post procedural complication. IMPRESSION: Successful removal of existing non tunneled right jugular approach temporary dialysis catheter and placement of new 19 cm tip to cuff tunneled hemodialysis catheter via the right internal jugular vein with tips terminating within the superior aspect of the right atrium. The catheter is ready for immediate use. Electronically Signed   By: Sandi Mariscal M.D.   On: 08/23/2019 16:42        Scheduled Meds:   stroke: mapping our early stages of recovery book   Does not apply Once   sodium chloride   Intravenous Once   ARIPiprazole  10 mg Per Tube BID   atorvastatin  20 mg Per NG tube Daily   chlorhexidine  15 mL Mouth Rinse BID   Chlorhexidine Gluconate Cloth  6 each Topical Q0600   Chlorhexidine Gluconate Cloth  6 each Topical Q0600   feeding supplement (PRO-STAT SUGAR FREE 64)  30 mL Per Tube Daily   ferrous sulfate  300 mg Per Tube Q breakfast   heparin       insulin aspart  0-9 Units Subcutaneous Q4H   mouth rinse  15 mL Mouth Rinse BID   midodrine  10 mg Per Tube TID WC   pantoprazole (PROTONIX) IV  40 mg Intravenous Q24H   sodium chloride flush  10-40 mL Intracatheter Q12H   Thrombi-Pad  1 each Topical Once   Continuous Infusions:  sodium chloride 10 mL (08/20/19 2025)   sodium chloride     sodium chloride     amiodarone 30 mg/hr (08/24/19 0054)    ceFAZolin (ANCEF) IV 2 g (08/23/19 1551)   feeding supplement (OSMOLITE 1.5 CAL) 1,000 mL (08/24/19 0213)   heparin 1,100 Units/hr (08/24/19 0554)   valproate sodium 250 mg (08/24/19 0556)    Assessment & Plan:   Principal  Problem:   MSSA bacteremia Active Problems:   Cardiac arrest ([place])   Pressure injury of skin   Acute respiratory failure with hypoxia (HCC)   AKI (acute kidney injury) ([place])   Elevated troponin   Persistent atrial fibrillation (HCC)   Acute on chronic combined systolic and diastolic CHF (congestive heart failure) (HCC)   Acute bacterial endocarditis   Malnutrition of moderate degree   Advanced care planning/counseling discussion   Goals of care, counseling/discussion   Palliative care by specialist  Closed fracture of multiple ribs   Cerebral embolism with cerebral infarction   Acute hypoxic respiratory failure due to cardiac arrest and pulmonary edema/bilateral pleural effusions:  Presented after out of hospital cardiac arrest. -Was intubated ands/p extubated, on nasal cannula O2 -Continue chest physiotherapy and incentive spirometry if patient can tolerate. -Volume status management by nephrology through hemodialysis She is status post tunneled HD catheter placement  Improvingacute metabolic encephalopathy suspect multifactorial with recent multiple acute CVA:In the setting of cardiac arrest hypothermia, MSSA bacteremia, prolonged hospitalization, versus others. CT head 08/16/2019 stable. On 08/22/2019 mental status was improved . On valproic acid as longstanding medications.  EEG with no evidence of seizure activity. Palliative care team following. MRI done on 08/19/2019 showed several scattered small acute infarcts in the white matter of the superior frontal lobes, left occipital lobe. No associated hemorrhage or mass-effect. Also several small subacute or perhaps chronic infarcts in the bilateral cerebellum. Neurology following -TTE -no evidence of vegetation or thrombus. -CT head without contrast on 08/21/2019 no sign of intracranial acute hemorrhage or acute ischemia.   Multiple acute/subacute CVA involving white matter of the superior frontal lobes and left  occipital lobe No hemorrhage, on heparin drip, neurology following. Okay to continue anticoagulation per neuro. -Continue with neuro checks  Witnessed cardiac arrest in the setting of history of chronic atrial fibrillation and coronary artery disease: Patient treated with hypothermia protocol.  Had TEE/DCCV earlier this year.  Overnight has gone back into atrial fibrillation with RVR.  Cardiology was reconsulted-recommend amnio bolus and may need higher dose of amiodarone.  Will need to transition from heparin to oral A/C due to CHA2DS2-VASc score of 6 (CHF, HTN, age, stroke, female)followed by cardiology.  Switch to Eliquis after final plans.   Acute on chronic systolic heart failure: TTE on 10/22 with EF 20%. Repeat TEE with improved ejection fraction to 60%. CAD evaluation pending renal improvement.  MSSA bacteremia Repeat cultures negative.  Seen by infectious disease recommended cefazolin until 12/7. TTEno evidence of endocarditis or thrombus.  Acute blood loss anemia No sign of overt bleeding Transfuse 1 unit PRBCat hemodialysis Currently H&H stable  AKI on CKD stage IV: Suspected AKI likely ATN in the setting of shock ,cardiac arrest and bacteremia.  Started on CRRT initially.Now on hemodialysis.  Nephrology following. Will need to move to [facility] Status post tunneled hemodialysis catheter placed Foley in place making urine, 300Ml last 24hrs., will see if neurology will like to DC Foley or still keep it for I's and O's  Hyperkalemia Management per nephrology with HD  Nutrition;on tube feeding via core track tube. speech therapist following- may need reevluation. Continue aspiration precautions  Physical debility/ambulatory dysfunction PT OT -recommended SNF CSW consulted for assistance with SNF placement. However I spoke to [facility] her boyfriend who is the power of attorney and he does not want patient to go SNF and will make the final decision.  DVT  prophylaxis:Heparin drip Code Status:Full code Family Communication: No one at bedside Disposition Plan:Possible discharge to SNF  Vs home pending what her power of attorney Mr. [name] her boyfriend will decide.  I had a extensive discussion with him and he is very upset that she will be going to SNF and prefers her coming home.  I explained to her him about her current issues status and SNF may be beneficial however he is worried about Covid and not being able to visit her but he will let me know if his final decision is to take her  home      LOS: 21 days   Time spent: More than 50 minutes with more than 50% on [place], MD [facility] Pager [phone]  If 7PM-7AM, please contact night-coverage [email or web address] Password [facility] 08/24/2019, 4:52 PM

## 2019-08-24 NOTE — Progress Notes (Signed)
[facility] NEPHROLOGY PROGRESS NOTE  Assessment/ Plan: Pt is a 73 y.o. yo female with out of hospital cardiac arrest CPR for 10 minutes, history of CKD stage IV, CHF.  She was a started dialysis on 10/25 via right IJ catheter.  Urine culture with E. coli, CRRT initiated on 10/30/120.  #dialysis dep't AoCKD4: CKD due to hypertension.  AKI likely ATN in the setting of shock, cardiac arrest and bacteremia.  She was on CRRT since 10/30-11/1.   -Kidney ultrasound showed solitary right kidney with cortical thinning and cysts. -Seen by palliative, full scope of care, potentially to [facility] - s/p TDC with IR 11/11 - Cont iHD THS Schedule: 2L, 3.5h, no hep, 2K bath, TDC.  - cont to eval for possible AVF or AVG, to debilitated currently  #Hyperkalemia: Cont HD  #Acute encephalopathy: Appears encephalopathic today.  11/7 MRI with scattered small acute infarcts and chronic infarcts noted.  #Cardiac arrest, acute on chronic systolic CHF: EF 86% by echo, cardiology is following.  EF improved in TEE.  #MSSA bacteremia: Continue cefazolin. End date six weeks from last negative culture 08/07/19 - 09/18/19.  #Hypotension: Stable.  Continue midodrine.  #Acute respiratory failure with hypoxia: HD today with UF.  On 2 L oxygen.  #Paroxysmal atrial fibrillation: Cardiology following.  On amiodarone and heparin drip.  Subjective:   No new events  0.3L UOP  NO AM Labs  S/p [facility] with IR yesterday, appreciate their help  Objective Vital signs in last 24 hours: Vitals:   08/24/19 0406 08/24/19 0408 08/24/19 0800 08/24/19 1200  BP: 121/69   127/62  Pulse: 88 91    Resp: (!) 38 (!) 29    Temp:   98.9 F (37.2 C) 98.5 F (36.9 C)  TempSrc:   Axillary Axillary  SpO2: 98% 97%    Weight: 78.2 kg     Height:       Weight change: -10.7 kg  Intake/Output Summary (Last 24 hours) at 08/24/2019 1710 Last data filed at 08/24/2019 0407 Gross per 24 hour  Intake 456.01 ml  Output 150 ml   Net 306.01 ml       Labs: Basic Metabolic Panel: Recent Labs  Lab 08/22/19 0448 08/23/19 0500 08/23/19 1154 08/24/19 0413 08/24/19 1119  NA 135  --  135  --  130*  K 5.5*  --  4.6  --  5.3*  CL 99  --  95*  --  91*  CO2 26  --  28  --  24  GLUCOSE 126*  --  148*  --  157*  BUN 49*  --  30*  --  43*  CREATININE 3.40*  --  2.30*  --  3.13*  CALCIUM 9.0  --  8.9  --  9.0  PHOS 4.6 3.0  --  4.2  --    Liver Function Tests: Recent Labs  Lab 08/18/19 1931  AST 10*  ALT <5  ALKPHOS 91  BILITOT 0.6  PROT 5.1*  ALBUMIN 1.7*   No results for input(s): LIPASE, AMYLASE in the last 168 hours. Recent Labs  Lab 08/20/19 1427  AMMONIA <9*   CBC: Recent Labs  Lab 08/20/19 0523 08/21/19 1040 08/22/19 0448 08/23/19 0500 08/24/19 0413  WBC 18.0* 16.6* 15.6* 15.6* 15.4*  HGB 7.3* 7.0* 6.9* 7.8* 8.2*  HCT 24.3* 23.4*  21.4* 23.2* 25.1* 25.8*  MCV 94.6 95.5 96.7 94.0 89.6  PLT 424* 429* 457* 386 410*   Cardiac Enzymes: No results for input(s):  CKTOTAL, CKMB, CKMBINDEX, TROPONINI in the last 168 hours. CBG: Recent Labs  Lab 08/23/19 2359 08/24/19 0414 08/24/19 0810 08/24/19 1118 08/24/19 1703  GLUCAP 101* 131* 120* 146* 124*    Iron Studies:  No results for input(s): IRON, TIBC, TRANSFERRIN, FERRITIN in the last 72 hours. Studies/Results: Ir Fluoro Guide Cv Line Right  Result Date: 08/23/2019 INDICATION: End-stage renal disease. In need of durable intravenous access for the continuation of dialysis. EXAM: TUNNELED CENTRAL VENOUS HEMODIALYSIS CATHETER PLACEMENT WITH ULTRASOUND AND FLUOROSCOPIC GUIDANCE MEDICATIONS: Ancef 2 gm IV . The antibiotic was given in an appropriate time interval prior to skin puncture. ANESTHESIA/SEDATION: Fentanyl 25 mcg IV; Moderate Sedation Time:  10 minutes The patient was continuously monitored during the procedure by the interventional radiology nurse under my direct supervision. FLUOROSCOPY TIME:  12 seconds (2 mGy) COMPLICATIONS:  None immediate. PROCEDURE: Informed written consent was obtained from the the patient's husband after a discussion of the risks, benefits, and alternatives to treatment. Questions regarding the procedure were encouraged and answered. The existing right internal jugular approach temporary dialysis catheter (previously placed at the patient's bedside, was removed and superficial hemostasis was achieved with manual compression. Next, the right neck and chest were prepped with chlorhexidine in a sterile fashion, and a sterile drape was applied covering the operative field. Maximum barrier sterile technique with sterile gowns and gloves were used for the procedure. A timeout was performed prior to the initiation of the procedure. After creating a small venotomy incision, a micropuncture kit was utilized to access the internal jugular vein. Real-time ultrasound guidance was utilized for vascular access including the acquisition of a permanent ultrasound image documenting patency of the accessed vessel. The microwire was utilized to measure appropriate catheter length. A stiff Glidewire was advanced to the level of the IVC and the micropuncture sheath was exchanged for a peel-away sheath. A palindrome tunneled hemodialysis catheter measuring 19 cm from tip to cuff was tunneled in a retrograde fashion from the anterior chest wall to the venotomy incision. The catheter was then placed through the peel-away sheath with tips ultimately positioned within the superior aspect of the right atrium. Final catheter positioning was confirmed and documented with a spot radiographic image. The catheter aspirates and flushes normally. The catheter was flushed with appropriate volume heparin dwells. The catheter exit site was secured with a 0-Prolene retention suture. The venotomy incision was closed with Dermabond and Steri-strips. Dressings were applied. The patient tolerated the procedure well without immediate post procedural  complication. IMPRESSION: Successful removal of existing non tunneled right jugular approach temporary dialysis catheter and placement of new 19 cm tip to cuff tunneled hemodialysis catheter via the right internal jugular vein with tips terminating within the superior aspect of the right atrium. The catheter is ready for immediate use. Electronically Signed   By: Sandi Mariscal M.D.   On: 08/23/2019 16:42   Ir US Guide Vasc Access Right  Result Date: 08/23/2019 INDICATION: End-stage renal disease. In need of durable intravenous access for the continuation of dialysis. EXAM: TUNNELED CENTRAL VENOUS HEMODIALYSIS CATHETER PLACEMENT WITH ULTRASOUND AND FLUOROSCOPIC GUIDANCE MEDICATIONS: Ancef 2 gm IV . The antibiotic was given in an appropriate time interval prior to skin puncture. ANESTHESIA/SEDATION: Fentanyl 25 mcg IV; Moderate Sedation Time:  10 minutes The patient was continuously monitored during the procedure by the interventional radiology nurse under my direct supervision. FLUOROSCOPY TIME:  12 seconds (2 mGy) COMPLICATIONS: None immediate. PROCEDURE: Informed written consent was obtained from the the patient's husband after  a discussion of the risks, benefits, and alternatives to treatment. Questions regarding the procedure were encouraged and answered. The existing right internal jugular approach temporary dialysis catheter (previously placed at the patient's bedside, was removed and superficial hemostasis was achieved with manual compression. Next, the right neck and chest were prepped with chlorhexidine in a sterile fashion, and a sterile drape was applied covering the operative field. Maximum barrier sterile technique with sterile gowns and gloves were used for the procedure. A timeout was performed prior to the initiation of the procedure. After creating a small venotomy incision, a micropuncture kit was utilized to access the internal jugular vein. Real-time ultrasound guidance was utilized for  vascular access including the acquisition of a permanent ultrasound image documenting patency of the accessed vessel. The microwire was utilized to measure appropriate catheter length. A stiff Glidewire was advanced to the level of the IVC and the micropuncture sheath was exchanged for a peel-away sheath. A palindrome tunneled hemodialysis catheter measuring 19 cm from tip to cuff was tunneled in a retrograde fashion from the anterior chest wall to the venotomy incision. The catheter was then placed through the peel-away sheath with tips ultimately positioned within the superior aspect of the right atrium. Final catheter positioning was confirmed and documented with a spot radiographic image. The catheter aspirates and flushes normally. The catheter was flushed with appropriate volume heparin dwells. The catheter exit site was secured with a 0-Prolene retention suture. The venotomy incision was closed with Dermabond and Steri-strips. Dressings were applied. The patient tolerated the procedure well without immediate post procedural complication. IMPRESSION: Successful removal of existing non tunneled right jugular approach temporary dialysis catheter and placement of new 19 cm tip to cuff tunneled hemodialysis catheter via the right internal jugular vein with tips terminating within the superior aspect of the right atrium. The catheter is ready for immediate use. Electronically Signed   By: Sandi Mariscal M.D.   On: 08/23/2019 16:42    Medications: Infusions: . sodium chloride 10 mL (08/20/19 2025)  . sodium chloride    . sodium chloride    . amiodarone 30 mg/hr (08/24/19 0054)  .  ceFAZolin (ANCEF) IV 2 g (08/23/19 1551)  . feeding supplement (OSMOLITE 1.5 CAL) 1,000 mL (08/24/19 0213)  . heparin 1,100 Units/hr (08/24/19 0554)  . valproate sodium 250 mg (08/24/19 0556)    Scheduled Medications: .  stroke: mapping our early stages of recovery book   Does not apply Once  . sodium chloride   Intravenous  Once  . ARIPiprazole  10 mg Per Tube BID  . atorvastatin  20 mg Per NG tube Daily  . chlorhexidine  15 mL Mouth Rinse BID  . Chlorhexidine Gluconate Cloth  6 each Topical Q0600  . Chlorhexidine Gluconate Cloth  6 each Topical Q0600  . feeding supplement (PRO-STAT SUGAR FREE 64)  30 mL Per Tube Daily  . ferrous sulfate  300 mg Per Tube Q breakfast  . heparin      . insulin aspart  0-9 Units Subcutaneous Q4H  . mouth rinse  15 mL Mouth Rinse BID  . midodrine  10 mg Per Tube TID WC  . pantoprazole (PROTONIX) IV  40 mg Intravenous Q24H  . sodium chloride flush  10-40 mL Intracatheter Q12H  . Thrombi-Pad  1 each Topical Once    have reviewed scheduled and prn medications.  Physical Exam: General: Alert awake but remains encephalopathic, unchanged Heart:RRR, s1s2 nl, no rubs Lungs: Bibasal crackles, no increased work of breathing  Abdomen:soft, Non-tender, non-distended Extremities: Trace edema Dialysis Access: Right IJ temporary catheter, site clean  Rexene Agent 08/24/2019,5:10 PM  LOS: 21 days  Pager: [phone]

## 2019-08-24 NOTE — Progress Notes (Signed)
  Speech Language Pathology Treatment: Dysphagia;Cognitive-Linquistic  Patient Details Name: Jaclyn Day MRN: [id number] DOB: [birth year] Today's Date: 08/24/2019 Time: [id number] SLP Time Calculation (min) (ACUTE ONLY): 18 min  Assessment / Plan / Recommendation Clinical Impression  SWALLOWING Pt with greatly improved clinical presentation from trials earlier this week.  Pt was awake/alert and eager for POs.  Pt tolerated puree, ice chip, and thin liquid by cup and spoon with no clinical s/s of aspiration.  Pt exhibited lingual discoordination and required verbal cuing to close mouth at times.  With soft solid, there was significant L side oral residue.  Following with puree bolus was not beneficial.  Suction was used to remove residuals.  Pt was unable to siphon from straw.  Pt required maximal verbal and tactile cues to close lips around straw, but was still unable to siphon liquid.  Pt appears to be doing most of her breathing orally rather than nasally, but there may also be a component of oral apraxia contributing to oral/lingual dyscoordination.  Despite good clinical presentation this date, recommend instrumental swallowing evaluation prior to initiation of PO diet.  Pt may need continued supplemental nutrition even if she is able to initiate PO diet.  Will plan for MBSS next date.  COMMUNICATION Family (boyfriend) in room reports improving speech/communication. Today pt was 100% intelligible for short phrases and single words.  Pt very clearly stated "I love you" spontaneously to SO in room.  Pt followed commands during PO trials, but occasionally required additional verbal and tactile cuing.  Pt answered personal, paired yes/no questions with 6/6 accuracy, and answered comparison yes/no questions with 4/5 accuracy.  She did expand upon one answer incorrectly though.  Have correctly identified that "no" Valentine's Day is not in October she went on to state that it is in November.   Pt  became tired and requested to rest rather than continue therapy at this time.  SLP will continue to follow.   HPI HPI: Pt is a 73 yo F admitted for witnessed out of hospital cardiac arrest, unclear etiology, with field ROSC, resultant respiratory failure and encephalopathy s/p TTM; ETT 10/22-11/1. CRRT initiated 10/30. PMH: unilateral congenital absence of kidney, Afib, HTN, CKD, CHF, anemia, mood disorder.  MRI on 11/7 revealed "Several scattered small acute infarcts in the white matter of the superior frontal lobes, left occipital lobe. No associated hemorrhage or mass effect. 2. Several small subacute or perhaps chronic infarcts in the bilateral cerebellum."      SLP Plan  Continue with current plan of care;MBS       Recommendations  Diet recommendations: NPO Medication Administration: Via alternative means                Oral Care Recommendations: Oral care QID;Staff/trained caregiver to provide oral care Follow up Recommendations: Skilled Nursing facility;24 hour supervision/assistance SLP Visit Diagnosis: Dysphagia, unspecified (R13.10) Plan: Continue with current plan of care;MBS       [place], [place], [place] Office: [phone] 08/24/2019, 11:23 AM

## 2019-08-24 NOTE — TOC Progression Note (Signed)
Transition of Care [facility]) - Progression Note    Patient Details  Name: Jaclyn Day MRN: [id number] Date of Birth: [birth year]  Transition of Care [facility]) CM/SW Contact  Graves-Bigelow, Ocie Cornfield, RN Phone Number: 08/24/2019, 3:51 PM  Clinical Narrative:   CM spoke with [name]-significant other. We discussed Ltac- he wants to speak with Liaison Lauren- arrangements have been made for 10:00 am phone call. CM will speak with patient and [name] afterwards to see if ok to proceed with LTAC and authorization. CM will continue to follow for additional transition of care needs.   Expected Discharge Plan: Long Term Acute Care (LTAC) Barriers to Discharge: Continued Medical Work up  Expected Discharge Plan and Services Expected Discharge Plan: [place] (LTAC) In-house Referral: NA Discharge Planning Services: CM Consult Post Acute Care Choice: [place] Living arrangements for the past 2 months: Single Family Home                  Social Determinants of Health (SDOH) Interventions    Readmission Risk Interventions Readmission Risk Prevention Plan 08/08/2019  Transportation Screening Complete  PCP or Specialist Appt within 3-5 Days Complete  HRI or Home Care Consult Complete  Medication Review (RN Care Manager) Complete  Some recent data might be hidden

## 2019-08-24 NOTE — Progress Notes (Addendum)
Progress Note  Patient Name: Jaclyn Day Date of Encounter: 08/24/2019  Primary Cardiologist: Ena Dawley, MD   Subjective   Patient denies CP or SOB. Urine output 343m in the last 24 hours.   Inpatient Medications    Scheduled Meds:   stroke: mapping our early stages of recovery book   Does not apply Once   sodium chloride   Intravenous Once   ARIPiprazole  10 mg Per Tube BID   atorvastatin  20 mg Per NG tube Daily   chlorhexidine  15 mL Mouth Rinse BID   Chlorhexidine Gluconate Cloth  6 each Topical Q0600   Chlorhexidine Gluconate Cloth  6 each Topical Q0600   feeding supplement (PRO-STAT SUGAR FREE 64)  30 mL Per Tube Daily   ferrous sulfate  300 mg Per Tube Q breakfast   furosemide  80 mg Intravenous BID   insulin aspart  0-9 Units Subcutaneous Q4H   mouth rinse  15 mL Mouth Rinse BID   midodrine  10 mg Per Tube TID WC   pantoprazole (PROTONIX) IV  40 mg Intravenous Q24H   sodium chloride flush  10-40 mL Intracatheter Q12H   Thrombi-Pad  1 each Topical Once   Continuous Infusions:  sodium chloride 10 mL (08/20/19 2025)   sodium chloride     sodium chloride     amiodarone 30 mg/hr (08/24/19 0054)    ceFAZolin (ANCEF) IV 2 g (08/23/19 1551)   feeding supplement (OSMOLITE 1.5 CAL) 1,000 mL (08/24/19 0213)   heparin 1,100 Units/hr (08/24/19 0554)   valproate sodium 250 mg (08/24/19 0556)   PRN Meds: sodium chloride, sodium chloride, acetaminophen (TYLENOL) oral liquid 160 mg/5 mL, alteplase, heparin, hydrALAZINE, HYDROmorphone (DILAUDID) injection, levalbuterol, lidocaine (PF), lidocaine-prilocaine, pentafluoroprop-tetrafluoroeth, sodium chloride flush   Vital Signs    Vitals:   08/23/19 2357 08/24/19 0406 08/24/19 0408 08/24/19 0800  BP: 125/73 121/69    Pulse: 81 88 91   Resp:  (!) 38 (!) 29   Temp: 98.3 F (36.8 C)   98.9 F (37.2 C)  TempSrc: Oral   Axillary  SpO2:  98% 97%   Weight:  78.2 kg    Height:         Intake/Output Summary (Last 24 hours) at 08/24/2019 0848 Last data filed at 08/24/2019 0407 Gross per 24 hour  Intake 456.01 ml  Output 300 ml  Net 156.01 ml   Last 3 Weights 08/24/2019 08/23/2019 08/22/2019  Weight (lbs) 172 lb 6.4 oz 172 lb 6.4 oz 195 lb 15.8 oz  Weight (kg) 78.2 kg 78.2 kg 88.9 kg      Telemetry   afib, HR 80-90s,  - Personally Reviewed  ECG    Pending - Personally Reviewed  Physical Exam   GEN: No acute distress.   Neck: No JVD Cardiac: Irreg Irreg, no murmurs, rubs, or gallops.  Respiratory: Clear to auscultation bilaterally. GI: Soft, nontender, non-distended  MS: Trace edema; No deformity. Neuro:  Nonfocal  Psych: Normal affect   Labs    High Sensitivity Troponin:   Recent Labs  Lab 08/03/19 1013 08/03/19 1147 08/03/19 1550 08/04/19 0934  TROPONINIHS 48* 715* 2,793* 3,543*      Chemistry Recent Labs  Lab 08/18/19 1931  08/21/19 1040 08/22/19 0448 08/23/19 1154  NA 133*   < > 137 135 135  K 5.9*   < > 4.9 5.5* 4.6  CL 98   < > 99 99 95*  CO2 23   < > 28 26  28  GLUCOSE 151*   < > 115* 126* 148*  BUN 80*   < > 37* 49* 30*  CREATININE 3.99*   < > 2.62* 3.40* 2.30*  CALCIUM 8.4*   < > 8.8* 9.0 8.9  PROT 5.1*  --   --   --   --   ALBUMIN 1.7*  --   --   --   --   AST 10*  --   --   --   --   ALT <5  --   --   --   --   ALKPHOS 91  --   --   --   --   BILITOT 0.6  --   --   --   --   GFRNONAA 11*   < > 18* 13* 21*  GFRAA 12*   < > 20* 15* 24*  ANIONGAP 12   < > _0 < > = values in this interval not displayed.     Hematology Recent Labs  Lab 08/22/19 0448 08/23/19 0500 08/24/19 0413  WBC 15.6* 15.6* 15.4*  RBC 2.40* 2.67* 2.88*  HGB 6.9* 7.8* 8.2*  HCT 23.2* 25.1* 25.8*  MCV 96.7 94.0 89.6  MCH 28.8 29.2 28.5  MCHC 29.7* 31.1 31.8  RDW 17.2* 17.2* 16.7*  PLT 457* 386 410*    BNPNo results for input(s): BNP, PROBNP in the last 168 hours.   DDimer No results for input(s): DDIMER in the last 168 hours.    Radiology    Ir Fluoro Guide Cv Line Right  Result Date: 08/23/2019 INDICATION: End-stage renal disease. In need of durable intravenous access for the continuation of dialysis. EXAM: TUNNELED CENTRAL VENOUS HEMODIALYSIS CATHETER PLACEMENT WITH ULTRASOUND AND FLUOROSCOPIC GUIDANCE MEDICATIONS: Ancef 2 gm IV . The antibiotic was given in an appropriate time interval prior to skin puncture. ANESTHESIA/SEDATION: Fentanyl 25 mcg IV; Moderate Sedation Time:  10 minutes The patient was continuously monitored during the procedure by the interventional radiology nurse under my direct supervision. FLUOROSCOPY TIME:  12 seconds (2 mGy) COMPLICATIONS: None immediate. PROCEDURE: Informed written consent was obtained from the the patient's husband after a discussion of the risks, benefits, and alternatives to treatment. Questions regarding the procedure were encouraged and answered. The existing right internal jugular approach temporary dialysis catheter (previously placed at the patient's bedside, was removed and superficial hemostasis was achieved with manual compression. Next, the right neck and chest were prepped with chlorhexidine in a sterile fashion, and a sterile drape was applied covering the operative field. Maximum barrier sterile technique with sterile gowns and gloves were used for the procedure. A timeout was performed prior to the initiation of the procedure. After creating a small venotomy incision, a micropuncture kit was utilized to access the internal jugular vein. Real-time ultrasound guidance was utilized for vascular access including the acquisition of a permanent ultrasound image documenting patency of the accessed vessel. The microwire was utilized to measure appropriate catheter length. A stiff Glidewire was advanced to the level of the IVC and the micropuncture sheath was exchanged for a peel-away sheath. A palindrome tunneled hemodialysis catheter measuring 19 cm from tip to cuff was tunneled  in a retrograde fashion from the anterior chest wall to the venotomy incision. The catheter was then placed through the peel-away sheath with tips ultimately positioned within the superior aspect of the right atrium. Final catheter positioning was confirmed and documented with a spot radiographic image. The catheter aspirates and flushes normally.  The catheter was flushed with appropriate volume heparin dwells. The catheter exit site was secured with a 0-Prolene retention suture. The venotomy incision was closed with Dermabond and Steri-strips. Dressings were applied. The patient tolerated the procedure well without immediate post procedural complication. IMPRESSION: Successful removal of existing non tunneled right jugular approach temporary dialysis catheter and placement of new 19 cm tip to cuff tunneled hemodialysis catheter via the right internal jugular vein with tips terminating within the superior aspect of the right atrium. The catheter is ready for immediate use. Electronically Signed   By: Sandi Mariscal M.D.   On: 08/23/2019 16:42   Ir US Guide Vasc Access Right  Result Date: 08/23/2019 INDICATION: End-stage renal disease. In need of durable intravenous access for the continuation of dialysis. EXAM: TUNNELED CENTRAL VENOUS HEMODIALYSIS CATHETER PLACEMENT WITH ULTRASOUND AND FLUOROSCOPIC GUIDANCE MEDICATIONS: Ancef 2 gm IV . The antibiotic was given in an appropriate time interval prior to skin puncture. ANESTHESIA/SEDATION: Fentanyl 25 mcg IV; Moderate Sedation Time:  10 minutes The patient was continuously monitored during the procedure by the interventional radiology nurse under my direct supervision. FLUOROSCOPY TIME:  12 seconds (2 mGy) COMPLICATIONS: None immediate. PROCEDURE: Informed written consent was obtained from the the patient's husband after a discussion of the risks, benefits, and alternatives to treatment. Questions regarding the procedure were encouraged and answered. The existing  right internal jugular approach temporary dialysis catheter (previously placed at the patient's bedside, was removed and superficial hemostasis was achieved with manual compression. Next, the right neck and chest were prepped with chlorhexidine in a sterile fashion, and a sterile drape was applied covering the operative field. Maximum barrier sterile technique with sterile gowns and gloves were used for the procedure. A timeout was performed prior to the initiation of the procedure. After creating a small venotomy incision, a micropuncture kit was utilized to access the internal jugular vein. Real-time ultrasound guidance was utilized for vascular access including the acquisition of a permanent ultrasound image documenting patency of the accessed vessel. The microwire was utilized to measure appropriate catheter length. A stiff Glidewire was advanced to the level of the IVC and the micropuncture sheath was exchanged for a peel-away sheath. A palindrome tunneled hemodialysis catheter measuring 19 cm from tip to cuff was tunneled in a retrograde fashion from the anterior chest wall to the venotomy incision. The catheter was then placed through the peel-away sheath with tips ultimately positioned within the superior aspect of the right atrium. Final catheter positioning was confirmed and documented with a spot radiographic image. The catheter aspirates and flushes normally. The catheter was flushed with appropriate volume heparin dwells. The catheter exit site was secured with a 0-Prolene retention suture. The venotomy incision was closed with Dermabond and Steri-strips. Dressings were applied. The patient tolerated the procedure well without immediate post procedural complication. IMPRESSION: Successful removal of existing non tunneled right jugular approach temporary dialysis catheter and placement of new 19 cm tip to cuff tunneled hemodialysis catheter via the right internal jugular vein with tips terminating within  the superior aspect of the right atrium. The catheter is ready for immediate use. Electronically Signed   By: Sandi Mariscal M.D.   On: 08/23/2019 16:42    Cardiac Studies   Echo 08/21/19 IMPRESSIONS  1. Left ventricular ejection fraction, by visual estimation, is 55 to 60%. The left ventricle has normal function. There is moderately increased left ventricular hypertrophy. 2. Left ventricular diastolic parameters are indeterminate. 3. Global right ventricle has normal systolic function.The  right ventricular size is normal. No increase in right ventricular wall thickness. 4. Left atrial size was moderately dilated. 5. Right atrial size was mildly dilated. 6. Trivial pericardial effusion is present. 7. The mitral valve is normal in structure. Trace mitral valve regurgitation. 8. The tricuspid valve is normal in structure. Tricuspid valve regurgitation is mild. 9. The aortic valve is tricuspid. Aortic valve regurgitation is not visualized. 10. The pulmonic valve was not well visualized. Pulmonic valve regurgitation is not visualized. 11. The tricuspid regurgitant velocity is 2.79 m/s, and with an assumed right atrial pressure of 8 mmHg, the estimated right ventricular systolic pressure is mildly elevated at 39.1 mmHg. 12. The inferior vena cava is normal in size with <50% respiratory variability, suggesting right atrial pressure of 8 mmHg. 13. The interatrial septum was not well visualized.   Patient Profile     73 y.o. female with a pmh of one kidney, CKD stage 5, HTN, chronic afib on Eliquis, Chronic diastolic HF who was admitted to ICU after witness cardiac arrest requiring intubation. Was found to be in afib RVR placed on amio and IV heparin. Was subsequently extubated. Found to have MSSA bacteremia and multiple acute infarcts. Cardiology previously following. Now noted to be Afib RVR and cardiology once again seeing patient.   Assessment & Plan   Afib RVR/Paroxysmal  afib Patient has h/o of afib with TEE/DCCV earlier this year. She was admitted to ICU for cardiac arrest noted to be in afib RVR started on IV heparin and IV amio drip. Patient subsequently converted and was transitioned to '400mg'$  daily. Overnight patient was noted to be back in afib RVr, rates 120s.  - Patient was started on amio. Rates have been slowly improving, now 80-90s. Patient will likely need higher maintenance dose of amiodarone ,?400 mg BID. MD to see - Potassium 4.6, TSH 3.55, magnesium 2.0 - continue heparin with plan to transition to Eliquis when able - Echo this admission showed EF 55-60%, RA and LA moderately dilated - CHADSVASC = 6 (CHF, HTN, age, stroke, female) - Monitor on telemetry - EKG ordered  Metabolic encephalopathy Multifactorial: post cardiac arrest, MSSA bacteremia, acute CVA - Mental status improving - EEG with no seizure activity - Neuro and palliative care following  Multiple acute infarcts in superior frontal lobes, left occipital lobe - MRI 11/7/ showed scattered small acute infarcts - Etiology possibly embolic in nature - neurology following  MSSA bacteremia - 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 Placement of tunneled HD cathter yesterday - Nephrology following  HTN - norvasc and metoprolol home meds. On hold for hypotension needing vasopressor support - permissive hypertension per neurology  Hyperlipidemia - Lipitor 20 mg daily  For questions or updates, please contact [place] Please consult [email or web address] for contact info under     Signed, Cadence Ninfa Meeker, PA-C  08/24/2019, 8:48 AM    The patient was seen, examined and discussed with Cadence Ninfa Meeker, PA-C   and I agree with the above.  Patient's heart rate is better controlled, now in 90s, we will continue amiodarone drip for now, volume is being managed by nephrology.  Ena Dawley, MD 08/24/2019

## 2019-08-25 ENCOUNTER — Inpatient Hospital Stay (HOSPITAL_COMMUNITY): Payer: 59

## 2019-08-25 DIAGNOSIS — I5043 Acute on chronic combined systolic (congestive) and diastolic (congestive) heart failure: Secondary | ICD-10-CM | POA: Diagnosis not present

## 2019-08-25 DIAGNOSIS — I469 Cardiac arrest, cause unspecified: Secondary | ICD-10-CM | POA: Diagnosis not present

## 2019-08-25 DIAGNOSIS — R7881 Bacteremia: Secondary | ICD-10-CM | POA: Diagnosis not present

## 2019-08-25 DIAGNOSIS — B9561 Methicillin susceptible Staphylococcus aureus infection as the cause of diseases classified elsewhere: Secondary | ICD-10-CM | POA: Diagnosis not present

## 2019-08-25 DIAGNOSIS — I330 Acute and subacute infective endocarditis: Secondary | ICD-10-CM | POA: Diagnosis not present

## 2019-08-25 LAB — CBC
HCT: 25.9 % — ABNORMAL LOW (ref 36.0–46.0)
Hemoglobin: 8 g/dL — ABNORMAL LOW (ref 12.0–15.0)
MCH: 28.2 pg (ref 26.0–34.0)
MCHC: 30.9 g/dL (ref 30.0–36.0)
MCV: 91.2 fL (ref 80.0–100.0)
Platelets: 453 10*3/uL — ABNORMAL HIGH (ref 150–400)
RBC: 2.84 MIL/uL — ABNORMAL LOW (ref 3.87–5.11)
RDW: 16.7 % — ABNORMAL HIGH (ref 11.5–15.5)
WBC: 15.8 10*3/uL — ABNORMAL HIGH (ref 4.0–10.5)
nRBC: 0 % (ref 0.0–0.2)

## 2019-08-25 LAB — GLUCOSE, CAPILLARY
Glucose-Capillary: 109 mg/dL — ABNORMAL HIGH (ref 70–99)
Glucose-Capillary: 132 mg/dL — ABNORMAL HIGH (ref 70–99)
Glucose-Capillary: 133 mg/dL — ABNORMAL HIGH (ref 70–99)
Glucose-Capillary: 145 mg/dL — ABNORMAL HIGH (ref 70–99)
Glucose-Capillary: 159 mg/dL — ABNORMAL HIGH (ref 70–99)

## 2019-08-25 LAB — BASIC METABOLIC PANEL
Anion gap: 14 (ref 5–15)
BUN: 21 mg/dL (ref 8–23)
CO2: 25 mmol/L (ref 22–32)
Calcium: 8.6 mg/dL — ABNORMAL LOW (ref 8.9–10.3)
Chloride: 96 mmol/L — ABNORMAL LOW (ref 98–111)
Creatinine, Ser: 1.78 mg/dL — ABNORMAL HIGH (ref 0.44–1.00)
GFR calc Af Amer: 32 mL/min — ABNORMAL LOW (ref >60)
GFR calc non Af Amer: 28 mL/min — ABNORMAL LOW (ref >60)
Glucose, Bld: 133 mg/dL — ABNORMAL HIGH (ref 70–99)
Potassium: 4.2 mmol/L (ref 3.5–5.1)
Sodium: 135 mmol/L (ref 135–145)

## 2019-08-25 LAB — HEPARIN LEVEL (UNFRACTIONATED): Heparin Unfractionated: 0.33 IU/mL (ref 0.30–0.70)

## 2019-08-25 LAB — PHOSPHORUS: Phosphorus: 2.8 mg/dL (ref 2.5–4.6)

## 2019-08-25 MED ORDER — RENA-VITE PO TABS
1.0000 | ORAL_TABLET | Freq: Every day | ORAL | Status: DC
  Administered 2019-08-25 – 2019-09-23 (×30): 1 via ORAL
  Filled 2019-08-25 (×30): qty 1

## 2019-08-25 MED ORDER — OSMOLITE 1.5 CAL PO LIQD
1000.0000 mL | ORAL | Status: DC
  Administered 2019-08-25 – 2019-08-31 (×6): 1000 mL
  Filled 2019-08-25 (×12): qty 1000

## 2019-08-25 MED ORDER — NEPRO/CARBSTEADY PO LIQD
237.0000 mL | Freq: Two times a day (BID) | ORAL | Status: DC
  Administered 2019-08-25 – 2019-09-24 (×24): 237 mL via ORAL

## 2019-08-25 NOTE — Progress Notes (Signed)
[place] for Heparin  Indication: atrial fibrillation  Allergies  Allergen Reactions  . Codeine Other (See Comments)    Reaction not recalled by family- was told to never take this    Patient Measurements: Height: 5\' 6"  (167.6 cm) Weight: 177 lb 11.1 oz (80.6 kg) IBW/kg (Calculated) : 59.3 Heparin Dosing Weight: 78 kg  Vital Signs: Temp: 98.4 F (36.9 C) (11/13 0534) Temp Source: Oral (11/13 0534) BP: 99/63 (11/13 0501) Pulse Rate: 98 (11/13 1000)  Labs: Recent Labs    08/23/19 0500 08/23/19 1154 08/24/19 0413 08/24/19 1119 08/24/19 1500 08/25/19 0017  HGB 7.8*  --  8.2*  --   --  8.0*  HCT 25.1*  --  25.8*  --   --  25.9*  PLT 386  --  410*  --   --  453*  HEPARINUNFRC <0.10*  --  0.20*  --  0.47 0.33  CREATININE  --  2.30*  --  3.13*  --  1.78*    Estimated Creatinine Clearance: 30.6 mL/min (A) (by C-G formula based on SCr of 1.78 mg/dL (H)).  Assessment: Pt is a 73 yr old female admitted S/P out of hospital cardiac arrest. ROSC was achieved and targeted temperature management was initiated. This admission has been complicated by continued encephalopathy, AKI on CKD which has advanced to dialysis, and hypotension.    MRI on 11/7 found several small acute infarcts (possibly embolic in nature), though no hemorrhage. Pt continues on heparin for Afib. Plans to change back to apixaban when taking po well -Heparin level at goal   Goal of Therapy:  Heparin level 0.3-0.5 units/ml Monitor platelets by anticoagulation protocol: Yes   Plan:  Continue heparin infusion at 1100 units/hr Monitor daily heparin level, CBC  Hildred Laser, PharmD Clinical Pharmacist **Pharmacist phone directory can now be found on [email or web address] ([email or web address]).  Listed under [place].

## 2019-08-25 NOTE — Progress Notes (Signed)
OT Cancellation Note  Patient Details Name: Jaclyn Day MRN: [id number] DOB: April 06, 1946   Cancelled Treatment:    Reason Eval/Treat Not Completed: Patient at procedure or test/ unavailable.  Will check back as able.   [name], COTA/L 08/25/2019, 10:51 AM

## 2019-08-25 NOTE — Progress Notes (Signed)
PROGRESS NOTE    Jaclyn Day  MKL:[id number] DOB: 1946/07/07 DOA: 08/03/2019 PCP: Cyndi Bender, PA-C    Brief Narrative:  Brief Narrative:  Patient is 73 year old with history of congenital unilateral kidney, stage IV chronic kidney disease with baseline creatinine about 2.2-2.8, hypertension, persistent A. fib on anticoagulation with Eliquis, chronic diastolic heart failure, history of cardioversion for A. fib admitted to [facility] witnessedcardiac arrest. Patient from home. 10/22 Admitted post arrest, intubated, initiation of targeted temperature management 36 celsius.  ECHO: LVEF: 20-25%, global hypokinesis, RV midly reduced function with normal size, mild MR, normal pulmonary pressures. Lactate cleared 10/25 - staph identified in blood and vanc started. Still with poor Ur OP. On amio gtt. Off leveophed and neoOn Heparin gtt. iHD w/ 1L off 10/26- following simple commands, placed on precedex for agitation, febrile- re-cultured and femoral CVL discontinued  10/28- converted to NSR, TEE  10/30 - CRRT for volume removal Blood culture 10/22 >>MSSA 1/4 COVID 10/22 >> Negative RVP 10/22 >> Negative Urine 10/22 - E colii 10/26 BCx 2 >> neg Remains in very poor mentation    Consultants:   Nephrology, interventional radiology, neurology, palliative care  Procedures:  -placement of tunneled HD catheter, CRRT, HD  Echo 08/21/19 1. Left ventricular ejection fraction, by visual estimation, is 55 to 60%. The left ventricle has normal function. There is moderately increased left ventricular hypertrophy. 2. Left ventricular diastolic parameters are indeterminate. 3. Global right ventricle has normal systolic function.The right ventricular size is normal. No increase in right ventricular wall thickness. 4. Left atrial size was moderately dilated. 5. Right atrial size was mildly dilated. 6. Trivial pericardial effusion is present. 7. The mitral valve is normal in  structure. Trace mitral valve regurgitation. 8. The tricuspid valve is normal in structure. Tricuspid valve regurgitation is mild. 9. The aortic valve is tricuspid. Aortic valve regurgitation is not visualized. 10. The pulmonic valve was not well visualized. Pulmonic valve regurgitation is not visualized. 11. The tricuspid regurgitant velocity is 2.79 m/s, and with an assumed right atrial pressure of 8 mmHg, the estimated right ventricular systolic pressure is mildly elevated at 39.1 mmHg. 12. The inferior vena cava is normal in size with <50% respiratory variability, suggesting right atrial pressure of 8 mmHg. 13. The interatrial septum was not well visualized.  Antimicrobials: Rocephin 10/22 >> 10/25 Vanc (staph in blood ) 10/24  Cefazolin 10/25 >>   Subjective: Patient actually more interactive this a.m.  Is answering questions.  Denies any shortness of breath, chest pain, abdominal pain or chills.  Asking if she is going home.  Objective: Vitals:   08/25/19 0406 08/25/19 0501 08/25/19 0534 08/25/19 1000  BP:  99/63    Pulse: 96 84  98  Resp: (!) 33   (!) 28  Temp:  100 F (37.8 C) 98.4 F (36.9 C)   TempSrc:  Oral Oral   SpO2: 99%   100%  Weight:      Height:        Intake/Output Summary (Last 24 hours) at 08/25/2019 1135 Last data filed at 08/24/2019 2235 Gross per 24 hour  Intake 2431.26 ml  Output 2661 ml  Net -229.74 ml   Filed Weights   08/24/19 0406 08/24/19 1230 08/24/19 1611  Weight: 78.2 kg 82.5 kg 80.6 kg    Examination: General exam: Appears calm and comfortable , interactive this a.m. answering questions looks better HEENT: EOMI, NG tube in place Respiratory system: Clear to auscultation. Respiratory effort poor Cardiovascular system: S1 &  S2 heard, RRR.  No murmurs, rubs, gallops or clicks.  Gastrointestinal system: Abdomen is nondistended, soft and nontender. No organomegaly or masses felt. Normal bowel sounds heard. Central nervous system:  Awake and alert .  Arms at bedside  Extremities: Trace pedal edema bilaterally Skin: Warm and dry Psychiatry: Mood & affect appropriate.     Data Reviewed: I have personally reviewed following labs and imaging studies  CBC: Recent Labs  Lab 08/21/19 1040 08/22/19 0448 08/23/19 0500 08/24/19 0413 08/25/19 0017  WBC 16.6* 15.6* 15.6* 15.4* 15.8*  HGB 7.0* 6.9* 7.8* 8.2* 8.0*  HCT 23.4*   21.4* 23.2* 25.1* 25.8* 25.9*  MCV 95.5 96.7 94.0 89.6 91.2  PLT 429* 457* 386 410* 574*   Basic Metabolic Panel: Recent Labs  Lab 08/21/19 1040 08/22/19 0448 08/23/19 0500 08/23/19 1154 08/24/19 0413 08/24/19 1119 08/25/19 0017  NA 137 135  --  135  --  130* 135  K 4.9 5.5*  --  4.6  --  5.3* 4.2  CL 99 99  --  95*  --  91* 96*  CO2 28 26  --  28  --  24 25  GLUCOSE 115* 126*  --  148*  --  157* 133*  BUN 37* 49*  --  30*  --  43* 21  CREATININE 2.62* 3.40*  --  2.30*  --  3.13* 1.78*  CALCIUM 8.8* 9.0  --  8.9  --  9.0 8.6*  MG  --   --   --  2.0  --   --   --   PHOS 4.1 4.6 3.0  --  4.2  --  2.8   GFR: Estimated Creatinine Clearance: 30.6 mL/min (A) (by C-G formula based on SCr of 1.78 mg/dL (H)). Liver Function Tests: Recent Labs  Lab 08/18/19 1931  AST 10*  ALT <5  ALKPHOS 91  BILITOT 0.6  PROT 5.1*  ALBUMIN 1.7*   No results for input(s): LIPASE, AMYLASE in the last 168 hours. Recent Labs  Lab 08/20/19 1427  AMMONIA <9*   Coagulation Profile: No results for input(s): INR, PROTIME in the last 168 hours. Cardiac Enzymes: No results for input(s): CKTOTAL, CKMB, CKMBINDEX, TROPONINI in the last 168 hours. BNP (last 3 results) No results for input(s): PROBNP in the last 8760 hours. HbA1C: No results for input(s): HGBA1C in the last 72 hours. CBG: Recent Labs  Lab 08/24/19 2232 08/25/19 0049 08/25/19 0458 08/25/19 0752 08/25/19 1130  GLUCAP 136* 159* 145* 133* 132*   Lipid Profile: No results for input(s): CHOL, HDL, LDLCALC, TRIG, CHOLHDL, LDLDIRECT in  the last 72 hours. Thyroid Function Tests: No results for input(s): TSH, T4TOTAL, FREET4, T3FREE, THYROIDAB in the last 72 hours. Anemia Panel: No results for input(s): VITAMINB12, FOLATE, FERRITIN, TIBC, IRON, RETICCTPCT in the last 72 hours. Sepsis Labs: No results for input(s): PROCALCITON, LATICACIDVEN in the last 168 hours.  No results found for this or any previous visit (from the past 240 hour(s)).       Radiology Studies: Ir Fluoro Guide Cv Line Right  Result Date: 08/23/2019 INDICATION: End-stage renal disease. In need of durable intravenous access for the continuation of dialysis. EXAM: TUNNELED CENTRAL VENOUS HEMODIALYSIS CATHETER PLACEMENT WITH ULTRASOUND AND FLUOROSCOPIC GUIDANCE MEDICATIONS: Ancef 2 gm IV . The antibiotic was given in an appropriate time interval prior to skin puncture. ANESTHESIA/SEDATION: Fentanyl 25 mcg IV; Moderate Sedation Time:  10 minutes The patient was continuously monitored during the procedure by the interventional radiology nurse  under my direct supervision. FLUOROSCOPY TIME:  12 seconds (2 mGy) COMPLICATIONS: None immediate. PROCEDURE: Informed written consent was obtained from the the patient's husband after a discussion of the risks, benefits, and alternatives to treatment. Questions regarding the procedure were encouraged and answered. The existing right internal jugular approach temporary dialysis catheter (previously placed at the patient's bedside, was removed and superficial hemostasis was achieved with manual compression. Next, the right neck and chest were prepped with chlorhexidine in a sterile fashion, and a sterile drape was applied covering the operative field. Maximum barrier sterile technique with sterile gowns and gloves were used for the procedure. A timeout was performed prior to the initiation of the procedure. After creating a small venotomy incision, a micropuncture kit was utilized to access the internal jugular vein. Real-time  ultrasound guidance was utilized for vascular access including the acquisition of a permanent ultrasound image documenting patency of the accessed vessel. The microwire was utilized to measure appropriate catheter length. A stiff Glidewire was advanced to the level of the IVC and the micropuncture sheath was exchanged for a peel-away sheath. A palindrome tunneled hemodialysis catheter measuring 19 cm from tip to cuff was tunneled in a retrograde fashion from the anterior chest wall to the venotomy incision. The catheter was then placed through the peel-away sheath with tips ultimately positioned within the superior aspect of the right atrium. Final catheter positioning was confirmed and documented with a spot radiographic image. The catheter aspirates and flushes normally. The catheter was flushed with appropriate volume heparin dwells. The catheter exit site was secured with a 0-Prolene retention suture. The venotomy incision was closed with Dermabond and Steri-strips. Dressings were applied. The patient tolerated the procedure well without immediate post procedural complication. IMPRESSION: Successful removal of existing non tunneled right jugular approach temporary dialysis catheter and placement of new 19 cm tip to cuff tunneled hemodialysis catheter via the right internal jugular vein with tips terminating within the superior aspect of the right atrium. The catheter is ready for immediate use. Electronically Signed   By: Sandi Mariscal M.D.   On: 08/23/2019 16:42   Ir US Guide Vasc Access Right  Result Date: 08/23/2019 INDICATION: End-stage renal disease. In need of durable intravenous access for the continuation of dialysis. EXAM: TUNNELED CENTRAL VENOUS HEMODIALYSIS CATHETER PLACEMENT WITH ULTRASOUND AND FLUOROSCOPIC GUIDANCE MEDICATIONS: Ancef 2 gm IV . The antibiotic was given in an appropriate time interval prior to skin puncture. ANESTHESIA/SEDATION: Fentanyl 25 mcg IV; Moderate Sedation Time:  10  minutes The patient was continuously monitored during the procedure by the interventional radiology nurse under my direct supervision. FLUOROSCOPY TIME:  12 seconds (2 mGy) COMPLICATIONS: None immediate. PROCEDURE: Informed written consent was obtained from the the patient's husband after a discussion of the risks, benefits, and alternatives to treatment. Questions regarding the procedure were encouraged and answered. The existing right internal jugular approach temporary dialysis catheter (previously placed at the patient's bedside, was removed and superficial hemostasis was achieved with manual compression. Next, the right neck and chest were prepped with chlorhexidine in a sterile fashion, and a sterile drape was applied covering the operative field. Maximum barrier sterile technique with sterile gowns and gloves were used for the procedure. A timeout was performed prior to the initiation of the procedure. After creating a small venotomy incision, a micropuncture kit was utilized to access the internal jugular vein. Real-time ultrasound guidance was utilized for vascular access including the acquisition of a permanent ultrasound image documenting patency of the accessed vessel.  The microwire was utilized to measure appropriate catheter length. A stiff Glidewire was advanced to the level of the IVC and the micropuncture sheath was exchanged for a peel-away sheath. A palindrome tunneled hemodialysis catheter measuring 19 cm from tip to cuff was tunneled in a retrograde fashion from the anterior chest wall to the venotomy incision. The catheter was then placed through the peel-away sheath with tips ultimately positioned within the superior aspect of the right atrium. Final catheter positioning was confirmed and documented with a spot radiographic image. The catheter aspirates and flushes normally. The catheter was flushed with appropriate volume heparin dwells. The catheter exit site was secured with a 0-Prolene  retention suture. The venotomy incision was closed with Dermabond and Steri-strips. Dressings were applied. The patient tolerated the procedure well without immediate post procedural complication. IMPRESSION: Successful removal of existing non tunneled right jugular approach temporary dialysis catheter and placement of new 19 cm tip to cuff tunneled hemodialysis catheter via the right internal jugular vein with tips terminating within the superior aspect of the right atrium. The catheter is ready for immediate use. Electronically Signed   By: Sandi Mariscal M.D.   On: 08/23/2019 16:42        Scheduled Meds:   stroke: mapping our early stages of recovery book   Does not apply Once   sodium chloride   Intravenous Once   ARIPiprazole  10 mg Per Tube BID   atorvastatin  20 mg Per NG tube Daily   chlorhexidine  15 mL Mouth Rinse BID   Chlorhexidine Gluconate Cloth  6 each Topical Q0600   Chlorhexidine Gluconate Cloth  6 each Topical Q0600   feeding supplement (PRO-STAT SUGAR FREE 64)  30 mL Per Tube Daily   ferrous sulfate  300 mg Per Tube Q breakfast   insulin aspart  0-9 Units Subcutaneous Q4H   mouth rinse  15 mL Mouth Rinse BID   midodrine  10 mg Per Tube TID WC   pantoprazole (PROTONIX) IV  40 mg Intravenous Q24H   sodium chloride flush  10-40 mL Intracatheter Q12H   Thrombi-Pad  1 each Topical Once   Continuous Infusions:  sodium chloride 10 mL (08/20/19 2025)   sodium chloride     sodium chloride     amiodarone 30 mg/hr (08/25/19 0345)    ceFAZolin (ANCEF) IV 2 g (08/23/19 1551)   feeding supplement (OSMOLITE 1.5 CAL) 1,000 mL (08/24/19 2246)   heparin 1,100 Units/hr (08/25/19 0344)   valproate sodium 250 mg (08/25/19 0532)    Assessment & Plan:   Principal Problem:   MSSA bacteremia Active Problems:   Cardiac arrest ([place])   Pressure injury of skin   Acute respiratory failure with hypoxia (HCC)   AKI (acute kidney injury) ([place])   Elevated troponin    Persistent atrial fibrillation (HCC)   Acute on chronic combined systolic and diastolic CHF (congestive heart failure) (HCC)   Acute bacterial endocarditis   Malnutrition of moderate degree   Advanced care planning/counseling discussion   Goals of care, counseling/discussion   Palliative care by specialist   Closed fracture of multiple ribs   Cerebral embolism with cerebral infarction  Acute hypoxic respiratory failure due to cardiac arrest and pulmonary edema/bilateral pleural effusions:  Presented after out of hospital cardiac arrest. -Wasintubated ands/pextubated,on nasal cannula O2 -Continue chest physiotherapy and incentive spirometry if patient can tolerate. -Volume status management by nephrology through hemodialysis    Improvingacute metabolic encephalopathy suspect multifactorial with recent multiple acute CVA:In the  setting of cardiac arrest hypothermia, MSSA bacteremia, prolonged hospitalization, versus others. CT head 08/16/2019 stable. On 08/22/2019 mental status was improved, continues to be improving today. On valproic acid as longstanding medications.  EEG with no evidence of seizure activity. Palliative care team following. MRI done on 08/19/2019 showed several scattered small acute infarcts in the white matter of the superior frontal lobes, left occipital lobe. No associated hemorrhage or mass-effect. Also several small subacute or perhaps chronic infarcts in the bilateral cerebellum. Neurology on board. -TTE-no evidence of vegetation or thrombus. -CT head without contrast on 08/21/2019 no sign of intracranial acute hemorrhage or acute ischemia.   Multiple acute/subacute CVA involving white matter of the superior frontal lobes and left occipital lobe No hemorrhage, on heparin drip, neurology following. Okay to continue anticoagulation per neuro. -Continue with neuro checks   Witnessed cardiac arrest in the setting of history of chronic atrial  fibrillation and coronary artery disease:  Patient treated with hypothermia protocol. -LHC initially deferred due to AoCKD in an attempt to avoid HD.  EF initially down to 20-25% 08/03/2019, improved to 55-60% on echo 08/21/2019.  -cards will discuss ischemic evaluation with Dr. Meda Coffee now that patient is on HD   Afib- with intermittent rvr Was SR, went back to afib rvr, cards was reconsulted.  Rate is better controlled today.   Cardiology following.  Recommend continuing amiodarone drip.   Recommend continuing heparin drip with plans to transition to Eliquis 5 mg twice daily prior to discharge for stroke prevention.   Could consider TEE/DCCV if A. fib flutter persist.   Had TEE/DCCV earlier this year.    Acute on chronic systolic heart failure: TTE on 10/22 with EF 20%. Repeat TEE with improved ejection fraction to 60%. CAD evaluation pending renal improvement.  MSSA bacteremia Repeat cultures negative.  Seen by infectious disease recommended cefazolin until 12/7. TTEno evidence of endocarditis or thrombus.  Acute blood loss anemia No sign of overt bleeding Transfuse 1 unit PRBCat hemodialysis Currently H&H stable  AKI on CKD stage IV: Suspected AKI likely ATN in the setting of shock ,cardiac arrest and bacteremia.  Started on CRRTinitially.10/30-11/1 -Now on hemodialysis.  -Nephrology following. -Kidney ultrasound showed solitary right kidney with cortical thinning and cysts. -Status post tunneled hemodialysis catheter placed - Foley or still keep it for I's and O's Per nephrology, evalu. For possible AVF or AVG, too debilitated currently  Hyperkalemia Management per nephrology with HD  Nutrition;on tube feeding via core track tube currently. Speech therapy testing was completed she is able to do pure diet with thin liquids will consult nutrition before discontinuing tube feeding Continue aspiration precautions  Physical debility/ambulatory  dysfunction PT OT-recommended SNF CSW -LTACH placement.  DVT prophylaxis:Heparin drip Code Status:Full code Family Communication:No one at bedside Disposition Plan:d/c to [facility] when stable . Her health care proxy , boyfriend, Konrad Dolores will talk to [facility] today to see if he is agreeable.    LOS: 22 days   Time spent: 45 minutes and more than 50% COC    Nolberto Hanlon, MD [facility] Pager [phone]  If 7PM-7AM, please contact night-coverage [email or web address] Password [email or web address] 08/25/2019, 11:35 AM

## 2019-08-25 NOTE — Progress Notes (Signed)
[name] NEPHROLOGY PROGRESS NOTE  Assessment/ Plan: Pt is a 73 y.o. yo female with out of hospital cardiac arrest CPR for 10 minutes, history of CKD stage IV, CHF.  She was a started dialysis on 10/25 via right IJ catheter.  Urine culture with E. coli, CRRT initiated on 10/30/120.  #dialysis dep't AoCKD4: CKD due to hypertension.  AKI likely ATN in the setting of shock, cardiac arrest and bacteremia.  She was on CRRT since 10/30-11/1.   -Kidney ultrasound showed solitary right kidney with cortical thinning and cysts. -Seen by palliative, full scope of care, potentially to [facility] - s/p TDC with IR 11/11 - Cont iHD THS Schedule: 2-3L, 3.5h, no hep, 2K bath, TDC.  - cont to eval for possible AVF or AVG, too debilitated currently  #Hyperkalemia: Cont HD  #Acute encephalopathy: May be somewhat improved but still present.  11/7 MRI with scattered small acute infarcts and chronic infarcts noted.  #Cardiac arrest, acute on chronic systolic CHF: EF 02% by echo, cardiology is following.  EF improved in TEE.  #MSSA bacteremia: Continue cefazolin. End date six weeks from last negative culture 08/07/19 - 09/18/19.  #Hypotension: Stable.  Continue midodrine.  #Acute respiratory failure with hypoxia: HD today with UF.  On 2 L oxygen.  #Paroxysmal atrial fibrillation: Cardiology following.  On amiodarone and heparin drip.  Subjective:   No new events  Minimal urine output  2.5 L UF with HD yesterday  Objective Vital signs in last 24 hours: Vitals:   08/25/19 0406 08/25/19 0501 08/25/19 0534 08/25/19 1000  BP:  99/63    Pulse: 96 84  98  Resp: (!) 33   (!) 28  Temp:  100 F (37.8 C) 98.4 F (36.9 C)   TempSrc:  Oral Oral   SpO2: 99%   100%  Weight:      Height:       Weight change: 4.3 kg  Intake/Output Summary (Last 24 hours) at 08/25/2019 1320 Last data filed at 08/24/2019 2235 Gross per 24 hour  Intake 2431.26 ml  Output 2661 ml  Net -229.74 ml        Labs: Basic Metabolic Panel: Recent Labs  Lab 08/23/19 0500 08/23/19 1154 08/24/19 0413 08/24/19 1119 08/25/19 0017  NA  --  135  --  130* 135  K  --  4.6  --  5.3* 4.2  CL  --  95*  --  91* 96*  CO2  --  28  --  24 25  GLUCOSE  --  148*  --  157* 133*  BUN  --  30*  --  43* 21  CREATININE  --  2.30*  --  3.13* 1.78*  CALCIUM  --  8.9  --  9.0 8.6*  PHOS 3.0  --  4.2  --  2.8   Liver Function Tests: Recent Labs  Lab 08/18/19 1931  AST 10*  ALT <5  ALKPHOS 91  BILITOT 0.6  PROT 5.1*  ALBUMIN 1.7*   No results for input(s): LIPASE, AMYLASE in the last 168 hours. Recent Labs  Lab 08/20/19 1427  AMMONIA <9*   CBC: Recent Labs  Lab 08/21/19 1040 08/22/19 0448 08/23/19 0500 08/24/19 0413 08/25/19 0017  WBC 16.6* 15.6* 15.6* 15.4* 15.8*  HGB 7.0* 6.9* 7.8* 8.2* 8.0*  HCT 23.4*  21.4* 23.2* 25.1* 25.8* 25.9*  MCV 95.5 96.7 94.0 89.6 91.2  PLT 429* 457* 386 410* 453*   Cardiac Enzymes: No results for input(s): CKTOTAL, CKMB,  CKMBINDEX, TROPONINI in the last 168 hours. CBG: Recent Labs  Lab 08/24/19 2232 08/25/19 0049 08/25/19 0458 08/25/19 0752 08/25/19 1130  GLUCAP 136* 159* 145* 133* 132*    Iron Studies:  No results for input(s): IRON, TIBC, TRANSFERRIN, FERRITIN in the last 72 hours. Studies/Results: Ir Fluoro Guide Cv Line Right  Result Date: 08/23/2019 INDICATION: End-stage renal disease. In need of durable intravenous access for the continuation of dialysis. EXAM: TUNNELED CENTRAL VENOUS HEMODIALYSIS CATHETER PLACEMENT WITH ULTRASOUND AND FLUOROSCOPIC GUIDANCE MEDICATIONS: Ancef 2 gm IV . The antibiotic was given in an appropriate time interval prior to skin puncture. ANESTHESIA/SEDATION: Fentanyl 25 mcg IV; Moderate Sedation Time:  10 minutes The patient was continuously monitored during the procedure by the interventional radiology nurse under my direct supervision. FLUOROSCOPY TIME:  12 seconds (2 mGy) COMPLICATIONS: None immediate.  PROCEDURE: Informed written consent was obtained from the the patient's husband after a discussion of the risks, benefits, and alternatives to treatment. Questions regarding the procedure were encouraged and answered. The existing right internal jugular approach temporary dialysis catheter (previously placed at the patient's bedside, was removed and superficial hemostasis was achieved with manual compression. Next, the right neck and chest were prepped with chlorhexidine in a sterile fashion, and a sterile drape was applied covering the operative field. Maximum barrier sterile technique with sterile gowns and gloves were used for the procedure. A timeout was performed prior to the initiation of the procedure. After creating a small venotomy incision, a micropuncture kit was utilized to access the internal jugular vein. Real-time ultrasound guidance was utilized for vascular access including the acquisition of a permanent ultrasound image documenting patency of the accessed vessel. The microwire was utilized to measure appropriate catheter length. A stiff Glidewire was advanced to the level of the IVC and the micropuncture sheath was exchanged for a peel-away sheath. A palindrome tunneled hemodialysis catheter measuring 19 cm from tip to cuff was tunneled in a retrograde fashion from the anterior chest wall to the venotomy incision. The catheter was then placed through the peel-away sheath with tips ultimately positioned within the superior aspect of the right atrium. Final catheter positioning was confirmed and documented with a spot radiographic image. The catheter aspirates and flushes normally. The catheter was flushed with appropriate volume heparin dwells. The catheter exit site was secured with a 0-Prolene retention suture. The venotomy incision was closed with Dermabond and Steri-strips. Dressings were applied. The patient tolerated the procedure well without immediate post procedural complication.  IMPRESSION: Successful removal of existing non tunneled right jugular approach temporary dialysis catheter and placement of new 19 cm tip to cuff tunneled hemodialysis catheter via the right internal jugular vein with tips terminating within the superior aspect of the right atrium. The catheter is ready for immediate use. Electronically Signed   By: Sandi Mariscal M.D.   On: 08/23/2019 16:42   Ir US Guide Vasc Access Right  Result Date: 08/23/2019 INDICATION: End-stage renal disease. In need of durable intravenous access for the continuation of dialysis. EXAM: TUNNELED CENTRAL VENOUS HEMODIALYSIS CATHETER PLACEMENT WITH ULTRASOUND AND FLUOROSCOPIC GUIDANCE MEDICATIONS: Ancef 2 gm IV . The antibiotic was given in an appropriate time interval prior to skin puncture. ANESTHESIA/SEDATION: Fentanyl 25 mcg IV; Moderate Sedation Time:  10 minutes The patient was continuously monitored during the procedure by the interventional radiology nurse under my direct supervision. FLUOROSCOPY TIME:  12 seconds (2 mGy) COMPLICATIONS: None immediate. PROCEDURE: Informed written consent was obtained from the the patient's husband after a discussion  of the risks, benefits, and alternatives to treatment. Questions regarding the procedure were encouraged and answered. The existing right internal jugular approach temporary dialysis catheter (previously placed at the patient's bedside, was removed and superficial hemostasis was achieved with manual compression. Next, the right neck and chest were prepped with chlorhexidine in a sterile fashion, and a sterile drape was applied covering the operative field. Maximum barrier sterile technique with sterile gowns and gloves were used for the procedure. A timeout was performed prior to the initiation of the procedure. After creating a small venotomy incision, a micropuncture kit was utilized to access the internal jugular vein. Real-time ultrasound guidance was utilized for vascular access  including the acquisition of a permanent ultrasound image documenting patency of the accessed vessel. The microwire was utilized to measure appropriate catheter length. A stiff Glidewire was advanced to the level of the IVC and the micropuncture sheath was exchanged for a peel-away sheath. A palindrome tunneled hemodialysis catheter measuring 19 cm from tip to cuff was tunneled in a retrograde fashion from the anterior chest wall to the venotomy incision. The catheter was then placed through the peel-away sheath with tips ultimately positioned within the superior aspect of the right atrium. Final catheter positioning was confirmed and documented with a spot radiographic image. The catheter aspirates and flushes normally. The catheter was flushed with appropriate volume heparin dwells. The catheter exit site was secured with a 0-Prolene retention suture. The venotomy incision was closed with Dermabond and Steri-strips. Dressings were applied. The patient tolerated the procedure well without immediate post procedural complication. IMPRESSION: Successful removal of existing non tunneled right jugular approach temporary dialysis catheter and placement of new 19 cm tip to cuff tunneled hemodialysis catheter via the right internal jugular vein with tips terminating within the superior aspect of the right atrium. The catheter is ready for immediate use. Electronically Signed   By: Sandi Mariscal M.D.   On: 08/23/2019 16:42   Dg Swallowing Func-speech Pathology  Result Date: 08/25/2019 Objective Swallowing Evaluation: Type of Study: MBS-Modified Barium Swallow Study  Patient Details Name: Jaclyn Day MRN: [id number] Date of Birth: [birth year] Today's Date: 08/25/2019 Time: SLP Start Time (ACUTE ONLY): 1046 -SLP Stop Time (ACUTE ONLY): 3614 SLP Time Calculation (min) (ACUTE ONLY): 17 min Past Medical History: Past Medical History: Diagnosis Date . Anemia  . Chronic diastolic CHF (congestive heart failure) ([place])  . CKD  (chronic kidney disease), stage IV ([place])  . Hypertension  . Persistent atrial fibrillation ([place])   a. dx 01/2019 s/p TEE DCCV. Marland Kitchen Renal disorder  . Unilateral congenital absence of kidney  Past Surgical History: Past Surgical History: Procedure Laterality Date . CARDIOVERSION N/A 01/31/2019  Procedure: CARDIOVERSION;  Surgeon: Lelon Perla, MD;  Location: [facility] ENDOSCOPY;  Service: Cardiovascular;  Laterality: N/A; . IR FLUORO GUIDE CV LINE RIGHT  08/23/2019 . IR US GUIDE VASC ACCESS RIGHT  08/23/2019 . TEE WITHOUT CARDIOVERSION N/A 01/31/2019  Procedure: TRANSESOPHAGEAL ECHOCARDIOGRAM (TEE);  Surgeon: Lelon Perla, MD;  Location: [facility] ENDOSCOPY;  Service: Cardiovascular;  Laterality: N/A; HPI: Pt is a 73 yo F admitted for witnessed out of hospital cardiac arrest, unclear etiology, with field ROSC, resultant respiratory failure and encephalopathy s/p TTM; ETT 10/22-11/1. CRRT initiated 10/30. PMH: unilateral congenital absence of kidney, Afib, HTN, CKD, CHF, anemia, mood disorder.  MRI on 11/7 revealed "Several scattered small acute infarcts in the white matter of the superior frontal lobes, left occipital lobe. No associated hemorrhage or mass effect. 2. Several small subacute  or perhaps chronic infarcts in the bilateral cerebellum."  Subjective: Pt awake, alert, pleasant, participative.  SO present for evaluation Assessment / Plan / Recommendation CHL IP CLINICAL IMPRESSIONS 08/25/2019 Clinical Impression Pt presents with a predominantly oral dysphagia c/b incomplete labial closure, impaired mastication, reduced lingual strength and coordination, and premature spillage.  There was anterior spillage of thin liquid.  Pt was unable to siphon from straw.  With regular and simulated ground consistency solids there was oral residue. With pill simulation, pt was unable to orally transit tablet in puree or with liquid wash and tablet was expectorated on both trials. Pharyngeal phase was largely functional, with swallow  intiation at the vallecula.  There was trace transient penetration of the thin liquid wash used during pill simulation only, which is suspected to be 2/2 mixed consistency nature of bolus trial.  Recommend puree diet with thin liquid by cup. SLP Visit Diagnosis Dysphagia, oropharyngeal phase (R13.12) Attention and concentration deficit following -- Frontal lobe and executive function deficit following -- Impact on safety and function Mild aspiration risk   CHL IP TREATMENT RECOMMENDATION 08/25/2019 Treatment Recommendations Therapy as outlined in treatment plan below   Prognosis 08/25/2019 Prognosis for Safe Diet Advancement Good Barriers to Reach Goals Cognitive deficits Barriers/Prognosis Comment -- CHL IP DIET RECOMMENDATION 08/25/2019 SLP Diet Recommendations Dysphagia 1 (Puree) solids;Thin liquid Liquid Administration via Cup Medication Administration Crushed with puree Compensations Minimize environmental distractions;Slow rate;Small sips/bites Postural Changes Seated upright at 90 degrees   CHL IP OTHER RECOMMENDATIONS 08/25/2019 Recommended Consults -- Oral Care Recommendations Oral care BID Other Recommendations --   CHL IP FOLLOW UP RECOMMENDATIONS 08/25/2019 Follow up Recommendations Skilled Nursing facility;24 hour supervision/assistance   CHL IP FREQUENCY AND DURATION 08/25/2019 Speech Therapy Frequency (ACUTE ONLY) min 2x/week Treatment Duration 2 weeks      CHL IP ORAL PHASE 08/25/2019 Oral Phase Impaired Oral - Pudding Teaspoon -- Oral - Pudding Cup -- Oral - Honey Teaspoon -- Oral - Honey Cup -- Oral - Nectar Teaspoon -- Oral - Nectar Cup -- Oral - Nectar Straw -- Oral - Thin Teaspoon -- Oral - Thin Cup Left anterior bolus loss;Right anterior bolus loss;Pocketing in anterior sulcus Oral - Thin Straw -- Oral - Puree Premature spillage;Decreased bolus cohesion Oral - Mech Soft Impaired mastication;Weak lingual manipulation;Reduced posterior propulsion;Lingual/palatal residue;Decreased bolus  cohesion;Premature spillage Oral - Regular Impaired mastication;Weak lingual manipulation;Reduced posterior propulsion;Lingual/palatal residue;Decreased bolus cohesion;Premature spillage Oral - Multi-Consistency -- Oral - Pill Reduced posterior propulsion;Holding of bolus Oral Phase - Comment --  CHL IP PHARYNGEAL PHASE 08/25/2019 Pharyngeal Phase Impaired Pharyngeal- Pudding Teaspoon -- Pharyngeal -- Pharyngeal- Pudding Cup -- Pharyngeal -- Pharyngeal- Honey Teaspoon -- Pharyngeal -- Pharyngeal- Honey Cup -- Pharyngeal -- Pharyngeal- Nectar Teaspoon -- Pharyngeal -- Pharyngeal- Nectar Cup -- Pharyngeal -- Pharyngeal- Nectar Straw -- Pharyngeal -- Pharyngeal- Thin Teaspoon -- Pharyngeal -- Pharyngeal- Thin Cup Delayed swallow initiation-vallecula;Pharyngeal residue - valleculae Pharyngeal Material does not enter airway Pharyngeal- Thin Straw -- Pharyngeal -- Pharyngeal- Puree Delayed swallow initiation-vallecula Pharyngeal Material does not enter airway Pharyngeal- Mechanical Soft Delayed swallow initiation-vallecula Pharyngeal Material does not enter airway Pharyngeal- Regular Delayed swallow initiation-vallecula Pharyngeal Material does not enter airway Pharyngeal- Multi-consistency -- Pharyngeal -- Pharyngeal- Pill Delayed swallow initiation-pyriform sinuses Pharyngeal Material enters airway, remains ABOVE vocal cords then ejected out Pharyngeal Comment --  CHL IP CERVICAL ESOPHAGEAL PHASE 08/25/2019 Cervical Esophageal Phase WFL Pudding Teaspoon -- Pudding Cup -- Honey Teaspoon -- Honey Cup -- Nectar Teaspoon -- Nectar Cup -- Nectar Straw -- Thin  Teaspoon -- Thin Cup -- Thin Straw -- Puree -- Mechanical Soft -- Regular -- Multi-consistency -- Pill -- Cervical Esophageal Comment -- Celedonio Savage , MA, CCC-SLP Acute Rehabilitation Services Office: [phone] 08/25/2019, 11:38 AM               Medications: Infusions: . sodium chloride 10 mL (08/20/19 2025)  . sodium chloride    . sodium chloride    .  amiodarone 30 mg/hr (08/25/19 0345)  .  ceFAZolin (ANCEF) IV 2 g (08/23/19 1551)  . feeding supplement (OSMOLITE 1.5 CAL)    . heparin 1,100 Units/hr (08/25/19 0344)  . valproate sodium 250 mg (08/25/19 1146)    Scheduled Medications: .  stroke: mapping our early stages of recovery book   Does not apply Once  . sodium chloride   Intravenous Once  . ARIPiprazole  10 mg Per Tube BID  . atorvastatin  20 mg Per NG tube Daily  . chlorhexidine  15 mL Mouth Rinse BID  . Chlorhexidine Gluconate Cloth  6 each Topical Q0600  . Chlorhexidine Gluconate Cloth  6 each Topical Q0600  . feeding supplement (NEPRO CARB STEADY)  237 mL Oral BID BM  . ferrous sulfate  300 mg Per Tube Q breakfast  . insulin aspart  0-9 Units Subcutaneous Q4H  . mouth rinse  15 mL Mouth Rinse BID  . midodrine  10 mg Per Tube TID WC  . multivitamin  1 tablet Oral QHS  . pantoprazole (PROTONIX) IV  40 mg Intravenous Q24H  . sodium chloride flush  10-40 mL Intracatheter Q12H  . Thrombi-Pad  1 each Topical Once    have reviewed scheduled and prn medications.  Physical Exam: General: Alert awake but remains encephalopathic, unchanged Heart:RRR, s1s2 nl, no rubs Lungs: Bibasal crackles, no increased work of breathing Abdomen:soft, Non-tender, non-distended Extremities: Trace edema Dialysis Access: Right IJ temporary catheter, site clean  Rexene Agent 08/25/2019,1:20 PM  LOS: 22 days  Pager: [phone]

## 2019-08-25 NOTE — Progress Notes (Signed)
Modified Barium Swallow Progress Note  Patient Details  Name: Jaclyn Day MRN: [id number] Date of Birth: [birth year]  Today's Date: 08/25/2019  Modified Barium Swallow completed.  Full report located under Chart Review in the Imaging Section.  Brief recommendations include the following:  Clinical Impression  Pt presents with a predominantly oral dysphagia c/b incomplete labial closure, impaired mastication, reduced lingual strength and coordination, and premature spillage.  There was anterior spillage of thin liquid.  Pt was unable to siphon from straw.  With regular and simulated ground consistency solids there was oral residue. With pill simulation, pt was unable to orally transit tablet in puree or with liquid wash and tablet was expectorated on both trials. Pharyngeal phase was largely functional, with swallow intiation at the vallecula.  There was trace transient penetration of the thin liquid wash used during pill simulation only, which is suspected to be 2/2 mixed consistency nature of trial.  Recommend puree diet with thin liquid by cup.   Swallow Evaluation Recommendations       SLP Diet Recommendations: Dysphagia 1 (Puree) solids;Thin liquid   Liquid Administration via: Cup   Medication Administration: Crushed with puree   Supervision: Full assist for feeding   Compensations:   Minimize environmental distractions;  Slow rate;  Small sips/bites;  Check oral cavity for residue and use liquid wash or lingual sweep as needed   Postural Changes: Seated upright at 90 degrees   Oral Care Recommendations: Oral care BID        Celedonio Savage, MA, [place] Office: [phone] 08/25/2019,11:36 AM

## 2019-08-25 NOTE — Progress Notes (Signed)
[place] for Heparin  Indication: atrial fibrillation  Allergies  Allergen Reactions  . Codeine Other (See Comments)    Reaction not recalled by family- was told to never take this    Patient Measurements: Height: 5\' 6"  (167.6 cm) Weight: 177 lb 11.1 oz (80.6 kg) IBW/kg (Calculated) : 59.3 Heparin Dosing Weight: 78 kg  Vital Signs: Temp: 98.7 F (37.1 C) (11/12 2238) Temp Source: Oral (11/12 2238) BP: 111/71 (11/12 2235) Pulse Rate: 110 (11/12 2235)  Labs: Recent Labs    08/22/19 0448  08/23/19 0500 08/23/19 1154 08/24/19 0413 08/24/19 1119 08/24/19 1500 08/25/19 0017  HGB 6.9*  --  7.8*  --  8.2*  --   --  8.0*  HCT 23.2*  --  25.1*  --  25.8*  --   --  25.9*  PLT 457*  --  386  --  410*  --   --  453*  HEPARINUNFRC 0.18*   < > <0.10*  --  0.20*  --  0.47 0.33  CREATININE 3.40*  --   --  2.30*  --  3.13*  --   --    < > = values in this interval not displayed.    Estimated Creatinine Clearance: 17.4 mL/min (A) (by C-G formula based on SCr of 3.13 mg/dL (H)).  Assessment: Pt is a 73 yr old female admitted S/P out of hospital cardiac arrest. ROSC was achieved and targeted temperature management was initiated. This admission has been complicated by continued encephalopathy, AKI on CKD which has advanced to dialysis, and hypotension.    MRI on 11/7 found several small acute infarcts (possibly embolic in nature), though no hemorrhage. Pt continues on heparin for Afib. Plans to change back to apixaban when taking po (needs to pass swallow evaluation)  Heparin level at heparin infusion rate to 1100 units/hr is 0.33 units/ml, which is within the goal range for this patient. H/H, platelets stable. Per RN, no issues with IV or bleeding observed.  Goal of Therapy:  Heparin level 0.3-0.5 units/ml Monitor platelets by anticoagulation protocol: Yes   Plan:  Continue heparin infusion at 1100 units/hr Monitor daily heparin level,  CBC Monitor for signs/symptoms of bleeding  Alanda Slim, PharmD, [facility] Clinical Pharmacist Please see AMION for all Pharmacists' Contact Phone Numbers 08/25/2019, 1:10 AM

## 2019-08-25 NOTE — TOC Progression Note (Signed)
Transition of Care [facility]) - Progression Note    Patient Details  Name: COLINA ZEPPIERI MRN: [id number] Date of Birth: Dec 07, 1945  Transition of Care [facility]) CM/SW Contact  Graves-Bigelow, Ocie Cornfield, RN Phone Number: 08/25/2019, 2:37 PM  Clinical Narrative: Ander Purpura Admissions Coordinator with LTAC- was able to speak with the patient's HCPOA [name]  regarding [facility]. Family to discuss over the weekend and hopefully the clinicals can be submitted to insurance to see if eligible for [facility]. CM will continue to follow for transition of care needs.       Expected Discharge Plan: Long Term Acute Care (LTAC) Barriers to Discharge: Continued Medical Work up  Expected Discharge Plan and Services Expected Discharge Plan: [place] (LTAC) In-house Referral: NA Discharge Planning Services: CM Consult Post Acute Care Choice: [place] arrangements for the past 2 months: Single Family Home                  Readmission Risk Interventions Readmission Risk Prevention Plan 08/08/2019  Transportation Screening Complete  PCP or Specialist Appt within 3-5 Days Complete  HRI or Home Care Consult Complete  Medication Review (RN Care Manager) Complete  Some recent data might be hidden

## 2019-08-25 NOTE — Progress Notes (Addendum)
Nutrition Follow-up  RD working remotely.  DOCUMENTATION CODES:   Non-severe (moderate) malnutrition in context of acute illness/injury  INTERVENTION:   -Initiate 48 hour calorie count per MD request; RD to follow-up on Monday, 08/28/19 to assess results -Nepro Shake po BID, each supplement provides 425 kcal and 19 grams protein -Renal MVI daily -D/c Prostat -Transition to nocturnal feedings:   Osmolite 1.5 @ 60 ml/hr via cortrak over 12 hour period  Tube feeding regimen provides 990 kcal (56% of needs), 41 grams of protein, and 503 ml of H2O.   NUTRITION DIAGNOSIS:   Moderate Malnutrition related to acute illness(acute respiratory failure, AKI required CRRT, endocarditis) as evidenced by mild fat depletion, mild muscle depletion, moderate muscle depletion.  Ongoing  GOAL:   Patient will meet greater than or equal to 90% of their needs  Progressing   MONITOR:   TF tolerance, Diet advancement, Labs, Weight trends, Skin  REASON FOR ASSESSMENT:   Consult Assessment of nutrition requirement/status, Calorie Count  ASSESSMENT:   73 year old female who presented to the ED on 10/22 after cardiac arrest. PMH anemia, CHF, CKD stage IV, HTN, IDDM. Pt required intubation in the ED. TTM initiated.  10/22 -Admit, Intubated, normothermia TTM 10/24 - rewarmed, TF initiated 10/25 - HD catheter placed, first HD 10/27 - HD 10/28 - s/p TEE suspicious for vegetation 10/30 - CRRT initiated 11/01 - CRRT discontinued, extubated 11/03 -Cortrak tube placed, TF initiated 11/11- s/p HD cath placement 11/13- s/p MBSS- advanced to dysphagia 1 diet with thin liquids  Reviewed I/O's: -246ml x 24 hours and -12.2 L since 08/11/19  UOP: 150 ml x 24 hours  Last HD treatment 08/24/19. Per palliative care, pt and family desire aggressive care at this time.   Pt with waxing and waning mentation, which has improved per SLP. Pt was just advanced to a dysphagia 1 diet with thin liquids s/p MBSS  today. No documented meal intake available at this time.   Pt currently receiving TF via cortrak tube- Osmolite 1.5 @ 50 ml/hr with 30 ml Prostat daily, which provides 1900 kcals, 96 grams protein, and 912 ml free water daily, meeting 100% of estimated needs. MD requesting calorie count; RD will transition to nocturnal feeding to help stimulate appetite.   Per RNCM notes, pt and family amenable to pursue LTACH.   Labs reviewed: CBGS: 133-159 (inpatient orders for glycemic control are 0-9 units insulin aspart every 4 hours).   Diet Order:   Diet Order            DIET - DYS 1 Room service appropriate? Yes; Fluid consistency: Thin  Diet effective now              EDUCATION NEEDS:   Not appropriate for education at this time  Skin:  Skin Assessment: Reviewed RN Assessment Skin Integrity Issues:: DTI DTI: -  Last BM:  08/23/19  Height:   Ht Readings from Last 1 Encounters:  08/03/19 5\' 6"  (1.676 m)    Weight:   Wt Readings from Last 1 Encounters:  08/24/19 80.6 kg    Ideal Body Weight:  59.1 kg  BMI:  Body mass index is 28.68 kg/m.  Estimated Nutritional Needs:   Kcal:  [id number]  Protein:  89-105  Fluid:  >/= 1.5 L    Rupert Azzara A. Jimmye Norman, RD, LDN, [place] Registered Dietitian II Certified Diabetes Care and Education Specialist Pager: [phone] After hours Pager: [phone]

## 2019-08-25 NOTE — Progress Notes (Addendum)
Progress Note  Patient Name: Jaclyn Day Date of Encounter: 08/25/2019  Primary Cardiologist: Ena Dawley, MD   Subjective   Somnolent. No complaints of chest pain or SOB.   Inpatient Medications    Scheduled Meds:   stroke: mapping our early stages of recovery book   Does not apply Once   sodium chloride   Intravenous Once   ARIPiprazole  10 mg Per Tube BID   atorvastatin  20 mg Per NG tube Daily   chlorhexidine  15 mL Mouth Rinse BID   Chlorhexidine Gluconate Cloth  6 each Topical Q0600   Chlorhexidine Gluconate Cloth  6 each Topical Q0600   feeding supplement (PRO-STAT SUGAR FREE 64)  30 mL Per Tube Daily   ferrous sulfate  300 mg Per Tube Q breakfast   insulin aspart  0-9 Units Subcutaneous Q4H   mouth rinse  15 mL Mouth Rinse BID   midodrine  10 mg Per Tube TID WC   pantoprazole (PROTONIX) IV  40 mg Intravenous Q24H   sodium chloride flush  10-40 mL Intracatheter Q12H   Thrombi-Pad  1 each Topical Once   Continuous Infusions:  sodium chloride 10 mL (08/20/19 2025)   sodium chloride     sodium chloride     amiodarone 30 mg/hr (08/25/19 0345)    ceFAZolin (ANCEF) IV 2 g (08/23/19 1551)   feeding supplement (OSMOLITE 1.5 CAL) 1,000 mL (08/24/19 2246)   heparin 1,100 Units/hr (08/25/19 0344)   valproate sodium 250 mg (08/25/19 0532)   PRN Meds: sodium chloride, sodium chloride, acetaminophen (TYLENOL) oral liquid 160 mg/5 mL, alteplase, heparin, hydrALAZINE, HYDROmorphone (DILAUDID) injection, levalbuterol, lidocaine (PF), lidocaine-prilocaine, pentafluoroprop-tetrafluoroeth, sodium chloride flush   Vital Signs    Vitals:   08/24/19 2238 08/25/19 0406 08/25/19 0501 08/25/19 0534  BP:   99/63   Pulse:  96 84   Resp:  (!) 33    Temp: 98.7 F (37.1 C)  100 F (37.8 C) 98.4 F (36.9 C)  TempSrc: Oral  Oral Oral  SpO2:  99%    Weight:      Height:        Intake/Output Summary (Last 24 hours) at 08/25/2019 0726 Last data  filed at 08/24/2019 2235 Gross per 24 hour  Intake 2431.26 ml  Output 2661 ml  Net -229.74 ml   Filed Weights   08/24/19 0406 08/24/19 1230 08/24/19 1611  Weight: 78.2 kg 82.5 kg 80.6 kg    Telemetry    Coarse atrial fibrillation vs atrial flutter with variable AV block with rates reasonably well controlled in the 80s-90s - Personally Reviewed  ECG    No new tracings - Personally Reviewed  Physical Exam   GEN: Ill appearing female sitting upright in bed in no acute distress.   Neck: No JVD, no carotid bruits Cardiac: IRIR, no murmurs, rubs, or gallops.  Respiratory: Clear to auscultation bilaterally anteriorly without overt wheezes/ rales/ rhonchi GI: NABS, Soft, nontender, non-distended  MS: No edema; No deformity. Neuro:  A&O x 2 (self and place), somnolent Psych: Difficult to assess  Labs    Chemistry Recent Labs  Lab 08/18/19 1931  08/23/19 1154 08/24/19 1119 08/25/19 0017  NA 133*   < > 135 130* 135  K 5.9*   < > 4.6 5.3* 4.2  CL 98   < > 95* 91* 96*  CO2 23   < > '28 24 25  ' GLUCOSE 151*   < > 148* 157* 133*  BUN 80*   < >  30* 43* 21  CREATININE 3.99*   < > 2.30* 3.13* 1.78*  CALCIUM 8.4*   < > 8.9 9.0 8.6*  PROT 5.1*  --   --   --   --   ALBUMIN 1.7*  --   --   --   --   AST 10*  --   --   --   --   ALT <5  --   --   --   --   ALKPHOS 91  --   --   --   --   BILITOT 0.6  --   --   --   --   GFRNONAA 11*   < > 21* 14* 28*  GFRAA 12*   < > 24* 16* 32*  ANIONGAP 12   < > '12 15 14   ' < > = values in this interval not displayed.     Hematology Recent Labs  Lab 08/23/19 0500 08/24/19 0413 08/25/19 0017  WBC 15.6* 15.4* 15.8*  RBC 2.67* 2.88* 2.84*  HGB 7.8* 8.2* 8.0*  HCT 25.1* 25.8* 25.9*  MCV 94.0 89.6 91.2  MCH 29.2 28.5 28.2  MCHC 31.1 31.8 30.9  RDW 17.2* 16.7* 16.7*  PLT 386 410* 453*    Cardiac EnzymesNo results for input(s): TROPONINI in the last 168 hours. No results for input(s): TROPIPOC in the last 168 hours.   BNPNo results for  input(s): BNP, PROBNP in the last 168 hours.   DDimer No results for input(s): DDIMER in the last 168 hours.   Radiology    Ir Fluoro Guide Cv Line Right  Result Date: 08/23/2019 INDICATION: End-stage renal disease. In need of durable intravenous access for the continuation of dialysis. EXAM: TUNNELED CENTRAL VENOUS HEMODIALYSIS CATHETER PLACEMENT WITH ULTRASOUND AND FLUOROSCOPIC GUIDANCE MEDICATIONS: Ancef 2 gm IV . The antibiotic was given in an appropriate time interval prior to skin puncture. ANESTHESIA/SEDATION: Fentanyl 25 mcg IV; Moderate Sedation Time:  10 minutes The patient was continuously monitored during the procedure by the interventional radiology nurse under my direct supervision. FLUOROSCOPY TIME:  12 seconds (2 mGy) COMPLICATIONS: None immediate. PROCEDURE: Informed written consent was obtained from the the patient's husband after a discussion of the risks, benefits, and alternatives to treatment. Questions regarding the procedure were encouraged and answered. The existing right internal jugular approach temporary dialysis catheter (previously placed at the patient's bedside, was removed and superficial hemostasis was achieved with manual compression. Next, the right neck and chest were prepped with chlorhexidine in a sterile fashion, and a sterile drape was applied covering the operative field. Maximum barrier sterile technique with sterile gowns and gloves were used for the procedure. A timeout was performed prior to the initiation of the procedure. After creating a small venotomy incision, a micropuncture kit was utilized to access the internal jugular vein. Real-time ultrasound guidance was utilized for vascular access including the acquisition of a permanent ultrasound image documenting patency of the accessed vessel. The microwire was utilized to measure appropriate catheter length. A stiff Glidewire was advanced to the level of the IVC and the micropuncture sheath was exchanged for  a peel-away sheath. A palindrome tunneled hemodialysis catheter measuring 19 cm from tip to cuff was tunneled in a retrograde fashion from the anterior chest wall to the venotomy incision. The catheter was then placed through the peel-away sheath with tips ultimately positioned within the superior aspect of the right atrium. Final catheter positioning was confirmed and documented with a spot radiographic image. The  catheter aspirates and flushes normally. The catheter was flushed with appropriate volume heparin dwells. The catheter exit site was secured with a 0-Prolene retention suture. The venotomy incision was closed with Dermabond and Steri-strips. Dressings were applied. The patient tolerated the procedure well without immediate post procedural complication. IMPRESSION: Successful removal of existing non tunneled right jugular approach temporary dialysis catheter and placement of new 19 cm tip to cuff tunneled hemodialysis catheter via the right internal jugular vein with tips terminating within the superior aspect of the right atrium. The catheter is ready for immediate use. Electronically Signed   By: Sandi Mariscal M.D.   On: 08/23/2019 16:42   Ir US Guide Vasc Access Right  Result Date: 08/23/2019 INDICATION: End-stage renal disease. In need of durable intravenous access for the continuation of dialysis. EXAM: TUNNELED CENTRAL VENOUS HEMODIALYSIS CATHETER PLACEMENT WITH ULTRASOUND AND FLUOROSCOPIC GUIDANCE MEDICATIONS: Ancef 2 gm IV . The antibiotic was given in an appropriate time interval prior to skin puncture. ANESTHESIA/SEDATION: Fentanyl 25 mcg IV; Moderate Sedation Time:  10 minutes The patient was continuously monitored during the procedure by the interventional radiology nurse under my direct supervision. FLUOROSCOPY TIME:  12 seconds (2 mGy) COMPLICATIONS: None immediate. PROCEDURE: Informed written consent was obtained from the the patient's husband after a discussion of the risks, benefits,  and alternatives to treatment. Questions regarding the procedure were encouraged and answered. The existing right internal jugular approach temporary dialysis catheter (previously placed at the patient's bedside, was removed and superficial hemostasis was achieved with manual compression. Next, the right neck and chest were prepped with chlorhexidine in a sterile fashion, and a sterile drape was applied covering the operative field. Maximum barrier sterile technique with sterile gowns and gloves were used for the procedure. A timeout was performed prior to the initiation of the procedure. After creating a small venotomy incision, a micropuncture kit was utilized to access the internal jugular vein. Real-time ultrasound guidance was utilized for vascular access including the acquisition of a permanent ultrasound image documenting patency of the accessed vessel. The microwire was utilized to measure appropriate catheter length. A stiff Glidewire was advanced to the level of the IVC and the micropuncture sheath was exchanged for a peel-away sheath. A palindrome tunneled hemodialysis catheter measuring 19 cm from tip to cuff was tunneled in a retrograde fashion from the anterior chest wall to the venotomy incision. The catheter was then placed through the peel-away sheath with tips ultimately positioned within the superior aspect of the right atrium. Final catheter positioning was confirmed and documented with a spot radiographic image. The catheter aspirates and flushes normally. The catheter was flushed with appropriate volume heparin dwells. The catheter exit site was secured with a 0-Prolene retention suture. The venotomy incision was closed with Dermabond and Steri-strips. Dressings were applied. The patient tolerated the procedure well without immediate post procedural complication. IMPRESSION: Successful removal of existing non tunneled right jugular approach temporary dialysis catheter and placement of new 19 cm  tip to cuff tunneled hemodialysis catheter via the right internal jugular vein with tips terminating within the superior aspect of the right atrium. The catheter is ready for immediate use. Electronically Signed   By: Sandi Mariscal M.D.   On: 08/23/2019 16:42    Cardiac Studies   Echo 08/21/19 IMPRESSIONS  1. Left ventricular ejection fraction, by visual estimation, is 55 to 60%. The left ventricle has normal function. There is moderately increased left ventricular hypertrophy. 2. Left ventricular diastolic parameters are indeterminate. 3. Global right  ventricle has normal systolic function.The right ventricular size is normal. No increase in right ventricular wall thickness. 4. Left atrial size was moderately dilated. 5. Right atrial size was mildly dilated. 6. Trivial pericardial effusion is present. 7. The mitral valve is normal in structure. Trace mitral valve regurgitation. 8. The tricuspid valve is normal in structure. Tricuspid valve regurgitation is mild. 9. The aortic valve is tricuspid. Aortic valve regurgitation is not visualized. 10. The pulmonic valve was not well visualized. Pulmonic valve regurgitation is not visualized. 11. The tricuspid regurgitant velocity is 2.79 m/s, and with an assumed right atrial pressure of 8 mmHg, the estimated right ventricular systolic pressure is mildly elevated at 39.1 mmHg. 12. The inferior vena cava is normal in size with <50% respiratory variability, suggesting right atrial pressure of 8 mmHg. 13. The interatrial septum was not well visualized.  Patient Profile     73 y.o. female with a pmh of one kidney, CKD stage 5, HTN, chronic afib on Eliquis, Chronic diastolic HF who was admitted to ICU after witness cardiac arrest requiring intubation. Was found to be in afib RVR placed on amio and IV heparin. Was subsequently extubated. Found to have MSSA bacteremia and multiple acute infarcts. Cardiology previously following. Now noted to be Afib  RVR and cardiology once again seeing patient.  Assessment & Plan    1. Atrial flutter vs coarse atrial fibrillation: patient with intermittent Afib RVR this admission. Started on an amiodarone gtt and transitioned to po after conversion to sinus rhythm, however back in Afib with RVR 08/24/2019 and was restarted on amiodarone gtt. Rates generally <100. Currently being treated for MSSA bacteremia and acute on chronic combined CHF which are likely contributing to RVR episodes.   - Continue amiodarone gtt for rate/rhythm control - Continue heparin gtt with plans to transition to eliquis 97m BID prior to discharge for stroke ppx - Could consider TEE/DCCV if Afib/flutter persists  2. Post-arrest: patient presented with out of hospital cardiac arrest, s/p cooling protocol. LHC initially deferred due to AoCKD in an attempt to avoid HD. EF initially down to 20-25% 08/03/2019, improved to 55-60% on echo 08/21/2019.  - Will discuss ischemic evaluation with Dr. NMeda Coffeenow that patient is on HD  3. Acute on chronic combined CHF: initial echo with EF 20-25% 08/03/2019 following out of hospital cardiac arrest. Repeat echo with normalization of EF to 55-60% 08/21/2019. She was subsequently started on HD for volume management. She is net -12L this admission with weights 192 lbs on admission down to 177lbs yesterday.  - Continue volume management per HD  4. Metabolic encephalopathy: likely multifactorial in the setting of post-arrest, MSSA bacteremia, acute CVA, and hospital delirium. Mental status improving. EEG negative. Electrolytes wnl.  - Continue management per primary team  5. MSSA bacteremia: white count slowly improving. ?vegetation on TEE. ID following for management of ID antibiotics - Continue antibiotics per ID/primary team  4. AoCKD stage IV: started on HD this admission s/p TDC. Nephrology following.  - Continue management per Nephrology  5. Anemia: s/p 1 uPRBC this admission. Hgb stable at 8 -  Continue close monitoring.   For questions or updates, please contact CPalermoPlease consult [email or web address] for contact info under Cardiology/STEMI.     Signed, KAbigail Butts PA-C  08/25/2019, 7:26 AM   [phone] The patient was seen, examined and discussed with KAbigail Butts PA-C  and I agree with the above.   The patients heart rate is better controlled now in  a 90-100 range, I would continue amiodarone drip how she cardiovert's, volume status is managed by hemodialysis, at some point after she rested there was a question about possible ischemic work-up, looking at this patient overall poor functional status and probably poor prognosis, with normal LVEF and no symptoms of chest pain or shortness of breath I would prefer medical management.    Ena Dawley, MD 08/25/2019

## 2019-08-26 DIAGNOSIS — I469 Cardiac arrest, cause unspecified: Secondary | ICD-10-CM | POA: Diagnosis not present

## 2019-08-26 DIAGNOSIS — I4891 Unspecified atrial fibrillation: Secondary | ICD-10-CM | POA: Diagnosis not present

## 2019-08-26 DIAGNOSIS — R7881 Bacteremia: Secondary | ICD-10-CM | POA: Diagnosis not present

## 2019-08-26 DIAGNOSIS — R778 Other specified abnormalities of plasma proteins: Secondary | ICD-10-CM | POA: Diagnosis not present

## 2019-08-26 DIAGNOSIS — N179 Acute kidney failure, unspecified: Secondary | ICD-10-CM | POA: Diagnosis not present

## 2019-08-26 DIAGNOSIS — B9561 Methicillin susceptible Staphylococcus aureus infection as the cause of diseases classified elsewhere: Secondary | ICD-10-CM | POA: Diagnosis not present

## 2019-08-26 LAB — BASIC METABOLIC PANEL
Anion gap: 15 (ref 5–15)
BUN: 47 mg/dL — ABNORMAL HIGH (ref 8–23)
CO2: 26 mmol/L (ref 22–32)
Calcium: 9 mg/dL (ref 8.9–10.3)
Chloride: 94 mmol/L — ABNORMAL LOW (ref 98–111)
Creatinine, Ser: 3.06 mg/dL — ABNORMAL HIGH (ref 0.44–1.00)
GFR calc Af Amer: 17 mL/min — ABNORMAL LOW (ref >60)
GFR calc non Af Amer: 15 mL/min — ABNORMAL LOW (ref >60)
Glucose, Bld: 131 mg/dL — ABNORMAL HIGH (ref 70–99)
Potassium: 5.1 mmol/L (ref 3.5–5.1)
Sodium: 135 mmol/L (ref 135–145)

## 2019-08-26 LAB — CBC
HCT: 24 % — ABNORMAL LOW (ref 36.0–46.0)
Hemoglobin: 7.6 g/dL — ABNORMAL LOW (ref 12.0–15.0)
MCH: 28.8 pg (ref 26.0–34.0)
MCHC: 31.7 g/dL (ref 30.0–36.0)
MCV: 90.9 fL (ref 80.0–100.0)
Platelets: 445 10*3/uL — ABNORMAL HIGH (ref 150–400)
RBC: 2.64 MIL/uL — ABNORMAL LOW (ref 3.87–5.11)
RDW: 17 % — ABNORMAL HIGH (ref 11.5–15.5)
WBC: 16 10*3/uL — ABNORMAL HIGH (ref 4.0–10.5)
nRBC: 0 % (ref 0.0–0.2)

## 2019-08-26 LAB — HEPARIN LEVEL (UNFRACTIONATED)
Heparin Unfractionated: 0.23 IU/mL — ABNORMAL LOW (ref 0.30–0.70)
Heparin Unfractionated: 0.31 IU/mL (ref 0.30–0.70)

## 2019-08-26 LAB — GLUCOSE, CAPILLARY: Glucose-Capillary: 125 mg/dL — ABNORMAL HIGH (ref 70–99)

## 2019-08-26 LAB — PHOSPHORUS: Phosphorus: 4.2 mg/dL (ref 2.5–4.6)

## 2019-08-26 MED ORDER — "THROMBI-PAD 3""X3"" EX PADS"
1.0000 | MEDICATED_PAD | Freq: Once | CUTANEOUS | Status: AC
  Administered 2019-08-27: 1 via TOPICAL
  Filled 2019-08-26 (×2): qty 1

## 2019-08-26 MED ORDER — HEPARIN SODIUM (PORCINE) 1000 UNIT/ML IJ SOLN
INTRAMUSCULAR | Status: AC
  Filled 2019-08-26: qty 4

## 2019-08-26 NOTE — Progress Notes (Signed)
Foley catheter was removed as ordered.  Had 60 mL of yellow cloudy/sedimented urine in a bag.  Idolina Primer, RN

## 2019-08-26 NOTE — Progress Notes (Signed)
[place] for Heparin  Indication: atrial fibrillation  Allergies  Allergen Reactions  . Codeine Other (See Comments)    Reaction not recalled by family- was told to never take this    Patient Measurements: Height: 5\' 6"  (167.6 cm) Weight: 167 lb 1.7 oz (75.8 kg) IBW/kg (Calculated) : 59.3 Heparin Dosing Weight: 78 kg  Vital Signs: Temp: 98.8 F (37.1 C) (11/14 1149) Temp Source: Axillary (11/14 1149) BP: 109/60 (11/14 1149) Pulse Rate: 94 (11/14 1149)  Labs: Recent Labs    08/24/19 0413 08/24/19 1119 08/24/19 1500 08/25/19 0017 08/26/19 0404  HGB 8.2*  --   --  8.0* 7.6*  HCT 25.8*  --   --  25.9* 24.0*  PLT 410*  --   --  453* 445*  HEPARINUNFRC 0.20*  --  0.47 0.33 0.23*  CREATININE  --  3.13*  --  1.78* 3.06*    Estimated Creatinine Clearance: 17.3 mL/min (A) (by C-G formula based on SCr of 3.06 mg/dL (H)).  Assessment: Pt is a 73 yr old female admitted S/P out of hospital cardiac arrest. ROSC was achieved and targeted temperature management was initiated. This admission has been complicated by continued encephalopathy, AKI on CKD which has advanced to dialysis, and hypotension.    MRI on 11/7 found several small acute infarcts (possibly embolic in nature), though no hemorrhage. Pt continues on heparin for Afib. Plans to change back to apixaban when taking po well  Heparin level this afternoon at the low end of therapeutic range at 0.31 after a dose increase to 1150 units/hour. H&H this morning was trended down at 7.6/24 with elev plts at 445.   Goal of Therapy:  Heparin level 0.3-0.5 units/ml Monitor platelets by anticoagulation protocol: Yes   Plan:  Increase heparin infusion to 1200 units/hr Monitor daily heparin level, CBC Monitor for signs of bleeding Follow up when changing back to PTA apixaban    Thank you,   Eddie Candle, PharmD PGY-1 Pharmacy Resident   Please check amion for clinical pharmacist  contact number

## 2019-08-26 NOTE — Progress Notes (Signed)
Pt had been gone to HD since 0700.  Came back to unit with continuous feeding running.  Feeding was stopped around 1330.  Idolina Primer, RN

## 2019-08-26 NOTE — Procedures (Signed)
I was present at this dialysis session. I have reviewed the session itself and made appropriate changes.   2K, 2.5L UF, TDC Qb 400.  PT more awake/verbal today.  Potential LTACH pt. Tentative next HD 11/17.  Filed Weights   08/24/19 1230 08/24/19 1611 08/26/19 0417  Weight: 82.5 kg 80.6 kg 77.5 kg    Recent Labs  Lab 08/26/19 0404  NA 135  K 5.1  CL 94*  CO2 26  GLUCOSE 131*  BUN 47*  CREATININE 3.06*  CALCIUM 9.0  PHOS 4.2    Recent Labs  Lab 08/24/19 0413 08/25/19 0017 08/26/19 0404  WBC 15.4* 15.8* 16.0*  HGB 8.2* 8.0* 7.6*  HCT 25.8* 25.9* 24.0*  MCV 89.6 91.2 90.9  PLT 410* 453* 445*    Scheduled Meds: .  stroke: mapping our early stages of recovery book   Does not apply Once  . sodium chloride   Intravenous Once  . ARIPiprazole  10 mg Per Tube BID  . atorvastatin  20 mg Per NG tube Daily  . chlorhexidine  15 mL Mouth Rinse BID  . Chlorhexidine Gluconate Cloth  6 each Topical Q0600  . Chlorhexidine Gluconate Cloth  6 each Topical Q0600  . feeding supplement (NEPRO CARB STEADY)  237 mL Oral BID BM  . ferrous sulfate  300 mg Per Tube Q breakfast  . insulin aspart  0-9 Units Subcutaneous Q4H  . mouth rinse  15 mL Mouth Rinse BID  . midodrine  10 mg Per Tube TID WC  . multivitamin  1 tablet Oral QHS  . pantoprazole (PROTONIX) IV  40 mg Intravenous Q24H  . sodium chloride flush  10-40 mL Intracatheter Q12H  . Thrombi-Pad  1 each Topical Once   Continuous Infusions: . sodium chloride 10 mL (08/20/19 2025)  . sodium chloride    . sodium chloride    . amiodarone 30 mg/hr (08/26/19 0351)  .  ceFAZolin (ANCEF) IV 2 g (08/23/19 1551)  . feeding supplement (OSMOLITE 1.5 CAL) 1,000 mL (08/25/19 1952)  . heparin 1,150 Units/hr (08/26/19 0701)  . valproate sodium 250 mg (08/26/19 0528)   PRN Meds:.sodium chloride, sodium chloride, acetaminophen (TYLENOL) oral liquid 160 mg/5 mL, alteplase, heparin, hydrALAZINE, HYDROmorphone (DILAUDID) injection, levalbuterol,  lidocaine (PF), lidocaine-prilocaine, pentafluoroprop-tetrafluoroeth, sodium chloride flush   Pearson Grippe  MD 08/26/2019, 7:58 AM

## 2019-08-26 NOTE — Progress Notes (Signed)
[place] for Heparin  Indication: atrial fibrillation  Allergies  Allergen Reactions  . Codeine Other (See Comments)    Reaction not recalled by family- was told to never take this    Patient Measurements: Height: 5\' 6"  (167.6 cm) Weight: 170 lb 13.7 oz (77.5 kg) IBW/kg (Calculated) : 59.3 Heparin Dosing Weight: 78 kg  Vital Signs: Temp: 99.3 F (37.4 C) (11/14 0417) Temp Source: Axillary (11/14 0417) BP: 157/85 (11/14 0417) Pulse Rate: 90 (11/14 0417)  Labs: Recent Labs    08/23/19 1154  08/24/19 0413 08/24/19 1119 08/24/19 1500 08/25/19 0017 08/26/19 0404  HGB  --    < > 8.2*  --   --  8.0* 7.6*  HCT  --   --  25.8*  --   --  25.9* 24.0*  PLT  --   --  410*  --   --  453* 445*  HEPARINUNFRC  --    < > 0.20*  --  0.47 0.33 0.23*  CREATININE 2.30*  --   --  3.13*  --  1.78*  --    < > = values in this interval not displayed.    Estimated Creatinine Clearance: 30 mL/min (A) (by C-G formula based on SCr of 1.78 mg/dL (H)).  Assessment: Pt is a 73 yr old female admitted S/P out of hospital cardiac arrest. ROSC was achieved and targeted temperature management was initiated. This admission has been complicated by continued encephalopathy, AKI on CKD which has advanced to dialysis, and hypotension.    MRI on 11/7 found several small acute infarcts (possibly embolic in nature), though no hemorrhage. Pt continues on heparin for Afib. Plans to change back to apixaban when taking po well  -Heparin level slightly below goal   Goal of Therapy:  Heparin level 0.3-0.5 units/ml Monitor platelets by anticoagulation protocol: Yes   Plan:  Increase heparin infusion at 1150 units/hr Recheck level in 8hr Monitor daily heparin level, CBC  Alanda Slim, PharmD, [facility] Clinical Pharmacist Please see AMION for all Pharmacists' Contact Phone Numbers 08/26/2019, 5:50 AM

## 2019-08-26 NOTE — Progress Notes (Signed)
Progress Note  Patient Name: ZONNIQUE CARSEY Date of Encounter: 08/26/2019  Primary Cardiologist: Ena Dawley, MD   Subjective   Currently on HD.  Denies any CP or SOB.  Wants to know when she is going home.  Inpatient Medications    Scheduled Meds: .  stroke: mapping our early stages of recovery book   Does not apply Once  . sodium chloride   Intravenous Once  . ARIPiprazole  10 mg Per Tube BID  . atorvastatin  20 mg Per NG tube Daily  . chlorhexidine  15 mL Mouth Rinse BID  . Chlorhexidine Gluconate Cloth  6 each Topical Q0600  . Chlorhexidine Gluconate Cloth  6 each Topical Q0600  . feeding supplement (NEPRO CARB STEADY)  237 mL Oral BID BM  . ferrous sulfate  300 mg Per Tube Q breakfast  . heparin      . insulin aspart  0-9 Units Subcutaneous Q4H  . mouth rinse  15 mL Mouth Rinse BID  . midodrine  10 mg Per Tube TID WC  . multivitamin  1 tablet Oral QHS  . pantoprazole (PROTONIX) IV  40 mg Intravenous Q24H  . sodium chloride flush  10-40 mL Intracatheter Q12H  . Thrombi-Pad  1 each Topical Once  . Thrombi-Pad  1 each Topical Once   Continuous Infusions: . sodium chloride 10 mL (08/20/19 2025)  . sodium chloride    . sodium chloride    . amiodarone 30 mg/hr (08/26/19 1548)  .  ceFAZolin (ANCEF) IV Stopped (08/26/19 1151)  . feeding supplement (OSMOLITE 1.5 CAL) 1,000 mL (08/26/19 2156)  . heparin 1,200 Units/hr (08/26/19 1700)  . valproate sodium 250 mg (08/26/19 1732)   PRN Meds: sodium chloride, sodium chloride, acetaminophen (TYLENOL) oral liquid 160 mg/5 mL, alteplase, heparin, hydrALAZINE, HYDROmorphone (DILAUDID) injection, levalbuterol, lidocaine (PF), lidocaine-prilocaine, pentafluoroprop-tetrafluoroeth, sodium chloride flush   Vital Signs    Vitals:   08/26/19 1559 08/26/19 1728 08/26/19 1829 08/26/19 2043  BP: 114/69 115/71 110/62 104/63  Pulse: (!) 110 (!) 111 96 (!) 114  Resp: (!) 36 (!) 33 (!) 34 (!) 48  Temp:    98.3 F (36.8 C)   TempSrc:    Oral  SpO2: 97% 99% 92% 95%  Weight:      Height:        Intake/Output Summary (Last 24 hours) at 08/26/2019 2237 Last data filed at 08/26/2019 1800 Gross per 24 hour  Intake 10 ml  Output 2060 ml  Net -2050 ml   Filed Weights   08/26/19 0417 08/26/19 0730 08/26/19 1149  Weight: 77.5 kg 77.9 kg 75.8 kg    Telemetry    Atrial fibrillation with CVR- Personally Reviewed  ECG    No new EKG to review- Personally Reviewed  Physical Exam   GEN: Well nourished, well developed in no acute distress HEENT: Normal NECK: No JVD; No carotid bruits LYMPHATICS: No lymphadenopathy CARDIAC:irregularly irregular, no murmurs, rubs, gallops RESPIRATORY:  Clear to auscultation without rales, wheezing or rhonchi  ABDOMEN: Soft, non-tender, non-distended MUSCULOSKELETAL:  No edema; No deformity  SKIN: Warm and dry NEUROLOGIC:  Alert and oriented x 3 PSYCHIATRIC:  Normal affect    Labs    Chemistry Recent Labs  Lab 08/24/19 1119 08/25/19 0017 08/26/19 0404  NA 130* 135 135  K 5.3* 4.2 5.1  CL 91* 96* 94*  CO2 24 25 26   GLUCOSE 157* 133* 131*  BUN 43* 21 47*  CREATININE 3.13* 1.78* 3.06*  CALCIUM 9.0 8.6*  9.0  GFRNONAA 14* 28* 15*  GFRAA 16* 32* 17*  ANIONGAP 15 14 15      Hematology Recent Labs  Lab 08/24/19 0413 08/25/19 0017 08/26/19 0404  WBC 15.4* 15.8* 16.0*  RBC 2.88* 2.84* 2.64*  HGB 8.2* 8.0* 7.6*  HCT 25.8* 25.9* 24.0*  MCV 89.6 91.2 90.9  MCH 28.5 28.2 28.8  MCHC 31.8 30.9 31.7  RDW 16.7* 16.7* 17.0*  PLT 410* 453* 445*    Cardiac EnzymesNo results for input(s): TROPONINI in the last 168 hours. No results for input(s): TROPIPOC in the last 168 hours.   BNPNo results for input(s): BNP, PROBNP in the last 168 hours.   DDimer No results for input(s): DDIMER in the last 168 hours.   Radiology    Dg Swallowing Func-speech Pathology  Result Date: 08/25/2019 Objective Swallowing Evaluation: Type of Study: MBS-Modified Barium Swallow  Study  Patient Details Name: MARTH WILTFONG MRN: [id number] Date of Birth: Feb 28, 1946 Today's Date: 08/25/2019 Time: SLP Start Time (ACUTE ONLY): 1046 -SLP Stop Time (ACUTE ONLY): 1103 SLP Time Calculation (min) (ACUTE ONLY): 17 min Past Medical History: Past Medical History: Diagnosis Date . Anemia  . Chronic diastolic CHF (congestive heart failure) ([place])  . CKD (chronic kidney disease), stage IV ([place])  . Hypertension  . Persistent atrial fibrillation ([place])   a. dx 01/2019 s/p TEE DCCV. Marland Kitchen Renal disorder  . Unilateral congenital absence of kidney  Past Surgical History: Past Surgical History: Procedure Laterality Date . CARDIOVERSION N/A 01/31/2019  Procedure: CARDIOVERSION;  Surgeon: Lelon Perla, MD;  Location: [facility] ENDOSCOPY;  Service: Cardiovascular;  Laterality: N/A; . IR FLUORO GUIDE CV LINE RIGHT  08/23/2019 . IR US GUIDE VASC ACCESS RIGHT  08/23/2019 . TEE WITHOUT CARDIOVERSION N/A 01/31/2019  Procedure: TRANSESOPHAGEAL ECHOCARDIOGRAM (TEE);  Surgeon: Lelon Perla, MD;  Location: [facility] ENDOSCOPY;  Service: Cardiovascular;  Laterality: N/A; HPI: Pt is a 73 yo F admitted for witnessed out of hospital cardiac arrest, unclear etiology, with field ROSC, resultant respiratory failure and encephalopathy s/p TTM; ETT 10/22-11/1. CRRT initiated 10/30. PMH: unilateral congenital absence of kidney, Afib, HTN, CKD, CHF, anemia, mood disorder.  MRI on 11/7 revealed "Several scattered small acute infarcts in the white matter of the superior frontal lobes, left occipital lobe. No associated hemorrhage or mass effect. 2. Several small subacute or perhaps chronic infarcts in the bilateral cerebellum."  Subjective: Pt awake, alert, pleasant, participative.  SO present for evaluation Assessment / Plan / Recommendation CHL IP CLINICAL IMPRESSIONS 08/25/2019 Clinical Impression Pt presents with a predominantly oral dysphagia c/b incomplete labial closure, impaired mastication, reduced lingual strength and coordination, and  premature spillage.  There was anterior spillage of thin liquid.  Pt was unable to siphon from straw.  With regular and simulated ground consistency solids there was oral residue. With pill simulation, pt was unable to orally transit tablet in puree or with liquid wash and tablet was expectorated on both trials. Pharyngeal phase was largely functional, with swallow intiation at the vallecula.  There was trace transient penetration of the thin liquid wash used during pill simulation only, which is suspected to be 2/2 mixed consistency nature of bolus trial.  Recommend puree diet with thin liquid by cup. SLP Visit Diagnosis Dysphagia, oropharyngeal phase (R13.12) Attention and concentration deficit following -- Frontal lobe and executive function deficit following -- Impact on safety and function Mild aspiration risk   CHL IP TREATMENT RECOMMENDATION 08/25/2019 Treatment Recommendations Therapy as outlined in treatment plan below   Prognosis 08/25/2019  Prognosis for Safe Diet Advancement Good Barriers to Reach Goals Cognitive deficits Barriers/Prognosis Comment -- CHL IP DIET RECOMMENDATION 08/25/2019 SLP Diet Recommendations Dysphagia 1 (Puree) solids;Thin liquid Liquid Administration via Cup Medication Administration Crushed with puree Compensations Minimize environmental distractions;Slow rate;Small sips/bites Postural Changes Seated upright at 90 degrees   CHL IP OTHER RECOMMENDATIONS 08/25/2019 Recommended Consults -- Oral Care Recommendations Oral care BID Other Recommendations --   CHL IP FOLLOW UP RECOMMENDATIONS 08/25/2019 Follow up Recommendations Skilled Nursing facility;24 hour supervision/assistance   CHL IP FREQUENCY AND DURATION 08/25/2019 Speech Therapy Frequency (ACUTE ONLY) min 2x/week Treatment Duration 2 weeks      CHL IP ORAL PHASE 08/25/2019 Oral Phase Impaired Oral - Pudding Teaspoon -- Oral - Pudding Cup -- Oral - Honey Teaspoon -- Oral - Honey Cup -- Oral - Nectar Teaspoon -- Oral - Nectar Cup  -- Oral - Nectar Straw -- Oral - Thin Teaspoon -- Oral - Thin Cup Left anterior bolus loss;Right anterior bolus loss;Pocketing in anterior sulcus Oral - Thin Straw -- Oral - Puree Premature spillage;Decreased bolus cohesion Oral - Mech Soft Impaired mastication;Weak lingual manipulation;Reduced posterior propulsion;Lingual/palatal residue;Decreased bolus cohesion;Premature spillage Oral - Regular Impaired mastication;Weak lingual manipulation;Reduced posterior propulsion;Lingual/palatal residue;Decreased bolus cohesion;Premature spillage Oral - Multi-Consistency -- Oral - Pill Reduced posterior propulsion;Holding of bolus Oral Phase - Comment --  CHL IP PHARYNGEAL PHASE 08/25/2019 Pharyngeal Phase Impaired Pharyngeal- Pudding Teaspoon -- Pharyngeal -- Pharyngeal- Pudding Cup -- Pharyngeal -- Pharyngeal- Honey Teaspoon -- Pharyngeal -- Pharyngeal- Honey Cup -- Pharyngeal -- Pharyngeal- Nectar Teaspoon -- Pharyngeal -- Pharyngeal- Nectar Cup -- Pharyngeal -- Pharyngeal- Nectar Straw -- Pharyngeal -- Pharyngeal- Thin Teaspoon -- Pharyngeal -- Pharyngeal- Thin Cup Delayed swallow initiation-vallecula;Pharyngeal residue - valleculae Pharyngeal Material does not enter airway Pharyngeal- Thin Straw -- Pharyngeal -- Pharyngeal- Puree Delayed swallow initiation-vallecula Pharyngeal Material does not enter airway Pharyngeal- Mechanical Soft Delayed swallow initiation-vallecula Pharyngeal Material does not enter airway Pharyngeal- Regular Delayed swallow initiation-vallecula Pharyngeal Material does not enter airway Pharyngeal- Multi-consistency -- Pharyngeal -- Pharyngeal- Pill Delayed swallow initiation-pyriform sinuses Pharyngeal Material enters airway, remains ABOVE vocal cords then ejected out Pharyngeal Comment --  CHL IP CERVICAL ESOPHAGEAL PHASE 08/25/2019 Cervical Esophageal Phase WFL Pudding Teaspoon -- Pudding Cup -- Honey Teaspoon -- Honey Cup -- Nectar Teaspoon -- Nectar Cup -- Nectar Straw -- Thin Teaspoon --  Thin Cup -- Thin Straw -- Puree -- Mechanical Soft -- Regular -- Multi-consistency -- Pill -- Cervical Esophageal Comment -- Celedonio Savage , MA, CCC-SLP Acute Rehabilitation Services Office: [phone] 08/25/2019, 11:38 AM               Cardiac Studies   Echo 08/21/19 IMPRESSIONS  1. Left ventricular ejection fraction, by visual estimation, is 55 to 60%. The left ventricle has normal function. There is moderately increased left ventricular hypertrophy. 2. Left ventricular diastolic parameters are indeterminate. 3. Global right ventricle has normal systolic function.The right ventricular size is normal. No increase in right ventricular wall thickness. 4. Left atrial size was moderately dilated. 5. Right atrial size was mildly dilated. 6. Trivial pericardial effusion is present. 7. The mitral valve is normal in structure. Trace mitral valve regurgitation. 8. The tricuspid valve is normal in structure. Tricuspid valve regurgitation is mild. 9. The aortic valve is tricuspid. Aortic valve regurgitation is not visualized. 10. The pulmonic valve was not well visualized. Pulmonic valve regurgitation is not visualized. 11. The tricuspid regurgitant velocity is 2.79 m/s, and with an assumed right atrial pressure of 8 mmHg, the  estimated right ventricular systolic pressure is mildly elevated at 39.1 mmHg. 12. The inferior vena cava is normal in size with <50% respiratory variability, suggesting right atrial pressure of 8 mmHg. 13. The interatrial septum was not well visualized.  Patient Profile     73 y.o. female with a pmh of one kidney, CKD stage 5, HTN, chronic afib on Eliquis, Chronic diastolic HF who was admitted to ICU after witness cardiac arrest requiring intubation. Was found to be in afib RVR placed on amio and IV heparin. Was subsequently extubated. Found to have MSSA bacteremia and multiple acute infarcts. Cardiology previously following. Now noted to be Afib RVR and cardiology  once again seeing patient.  Assessment & Plan    1. Atrial flutter vs coarse atrial fibrillation -patient with intermittent Afib RVR this admission.  -Started on an amiodarone gtt and transitioned to po after conversion to sinus rhythm, however back in Afib with RVR 08/24/2019 and was restarted on amiodarone gtt. -HR currently controlled in the 80-90's -stress of MSSA bacteremia and acute on chronic combined CHF which are likely contributing to RVR episodes.   -Continue amiodarone gtt for rate/rhythm control -Continue heparin gtt with plans to transition to eliquis 5mg  BID prior to discharge for stroke ppx -Could consider TEE/DCCV if Afib/flutter persists  2. Post-arrest:  -patient presented with out of hospital cardiac arrest, s/p cooling protocol. - LHC initially deferred due to AoCKD in an attempt to avoid HD.  -EF initially down to 20-25% 08/03/2019, improved to 55-60% on echo 08/21/2019.   3. Acute on chronic combined CHF: - initial echo with EF 20-25% 08/03/2019 following out of hospital cardiac arrest.  -Repeat echo with normalization of EF to 55-60% 08/21/2019. -She was subsequently started on HD for volume management.  -She is net -10L this admission with weights 192 lbs on admission down to 167lbs today. -Continue volume management per HD  4. Metabolic encephalopathy:  -likely multifactorial in the setting of post-arrest, MSSA bacteremia, acute CVA, and hospital delirium.  -Mental status improving. EEG negative. Electrolytes wnl.  -Continue management per primary team  5. MSSA bacteremia:  -white count slowly up to 16.4 today -?vegetation on TEE.  -ID following for management of ID antibiotics -Continue antibiotics per ID/primary team  4. AoCKD stage IV: - started on HD this admission s/p TDC.   - Continue management per Nephrology  5. Anemia:  -s/p 1 uPRBC this admission. - Hgb down to 7.6 from 8 yesterday - Continue close monitoring.   For questions or updates,  please contact [facility] Please consult [email or web address] for contact info under Cardiology/STEMI.     Signed, Fransico Him, MD  08/26/2019, 10:37 PM   [phone]  The patient was seen, examined and discussed with Abigail Butts, PA-C  and I agree with the above.   The patients heart rate is better controlled now in a 90-100 range, I would continue amiodarone drip how she cardiovert's, volume status is managed by hemodialysis, at some point after she rested there was a question about possible ischemic work-up, looking at this patient overall poor functional status and probably poor prognosis, with normal LVEF and no symptoms of chest pain or shortness of breath I would prefer medical management.    Fransico Him, MD 08/26/2019

## 2019-08-26 NOTE — Progress Notes (Signed)
PROGRESS NOTE    Jaclyn Day  [id number] DOB: 1946/07/30 DOA: 08/03/2019 PCP: Cyndi Bender, PA-C    Brief Narrative:  Patient is 73 year old with history of congenital unilateral kidney, stage IV chronic kidney disease with baseline creatinine about 2.2-2.8, hypertension, persistent A. fib on anticoagulation with Eliquis, chronic diastolic heart failure, history of cardioversion for A. fib admitted to [facility] witnessedcardiac arrest. Patient from home. 10/22 Admitted post arrest, intubated, initiation of targeted temperature management 36 celsius.  ECHO: LVEF: 20-25%, global hypokinesis, RV midly reduced function with normal size, mild MR, normal pulmonary pressures. Lactate cleared 10/25 - staph identified in blood and vanc started. Still with poor Ur OP. On amio gtt. Off leveophed and neoOn Heparin gtt. iHD w/ 1L off 10/26- following simple commands, placed on precedex for agitation, febrile- re-cultured and femoral CVL discontinued  10/28- converted to NSR, TEE  10/30 - CRRT for volume removal Blood culture 10/22 >>MSSA 1/4 COVID 10/22 >> Negative RVP 10/22 >> Negative Urine 10/22 - E colii 10/26 BCx 2 >> neg Remains in very poor mentation    Consultants:  Nephrology, interventional radiology, neurology, palliative care  RD, speech pathology  Procedures: -placement of tunneled HD catheter, CRRT, HD  Echo 08/21/19 1. Left ventricular ejection fraction, by visual estimation, is 55 to 60%. The left ventricle has normal function. There is moderately increased left ventricular hypertrophy. 2. Left ventricular diastolic parameters are indeterminate. 3. Global right ventricle has normal systolic function.The right ventricular size is normal. No increase in right ventricular wall thickness. 4. Left atrial size was moderately dilated. 5. Right atrial size was mildly dilated. 6. Trivial pericardial effusion is present. 7. The mitral valve is  normal in structure. Trace mitral valve regurgitation. 8. The tricuspid valve is normal in structure. Tricuspid valve regurgitation is mild. 9. The aortic valve is tricuspid. Aortic valve regurgitation is not visualized. 10. The pulmonic valve was not well visualized. Pulmonic valve regurgitation is not visualized. 11. The tricuspid regurgitant velocity is 2.79 m/s, and with an assumed right atrial pressure of 8 mmHg, the estimated right ventricular systolic pressure is mildly elevated at 39.1 mmHg. 12. The inferior vena cava is normal in size with <50% respiratory variability, suggesting right atrial pressure of 8 mmHg. 13. The interatrial septum was not well visualized.  Antimicrobials: Rocephin 10/22 >> 10/25 Vanc (staph in blood ) 10/24  Cefazolin 10/25 >>  Subjective: Patient had hemodialysis, very sleepy, barely opens her eyes when I talk to her.  Awaiting dialysis.  Objective: Vitals:   08/25/19 1753 08/25/19 2054 08/25/19 2358 08/26/19 0417  BP: 113/78 113/60 (!) 142/91 (!) 157/85  Pulse:  96 92 90  Resp:  (!) 29 (!) 30 (!) 25  Temp:  99.2 F (37.3 C) 99.8 F (37.7 C) 99.3 F (37.4 C)  TempSrc:  Oral Axillary Axillary  SpO2:  100% 99% 99%  Weight:    77.5 kg  Height:        Intake/Output Summary (Last 24 hours) at 08/26/2019 0749 Last data filed at 08/26/2019 0425 Gross per 24 hour  Intake 957.7 ml  Output 100 ml  Net 857.7 ml   Filed Weights   08/24/19 1230 08/24/19 1611 08/26/19 0417  Weight: 82.5 kg 80.6 kg 77.5 kg    Examination: General exam: Appears calm, sleepy , receiving dialysis  HEENT: EOMI, NG tube in place Respiratory system: Clear to auscultation anteriorly.  With poor respiratory effort Cardiovascular system: S1 & S2 heard, RRR.  No murmurs, rubs, gallops or  clicks.  Gastrointestinal system: Abdomen is nondistended, soft and nontender. No organomegaly or masses felt. Normal bowel sounds heard. Central nervous system:  Sleepy unable to  perform exam GU: Foley in place Extremities: Trace pedal edema bilaterally Skin: Warm and dry Psychiatry: Mood & affect appears appropriate.    Data Reviewed: I have personally reviewed following labs and imaging studies  CBC: Recent Labs  Lab 08/22/19 0448 08/23/19 0500 08/24/19 0413 08/25/19 0017 08/26/19 0404  WBC 15.6* 15.6* 15.4* 15.8* 16.0*  HGB 6.9* 7.8* 8.2* 8.0* 7.6*  HCT 23.2* 25.1* 25.8* 25.9* 24.0*  MCV 96.7 94.0 89.6 91.2 90.9  PLT 457* 386 410* 453* [id number]*   Basic Metabolic Panel: Recent Labs  Lab 08/22/19 0448 08/23/19 0500 08/23/19 1154 08/24/19 0413 08/24/19 1119 08/25/19 0017 08/26/19 0404  NA 135  --  135  --  130* 135 135  K 5.5*  --  4.6  --  5.3* 4.2 5.1  CL 99  --  95*  --  91* 96* 94*  CO2 26  --  28  --  24 25 26   GLUCOSE 126*  --  148*  --  157* 133* 131*  BUN 49*  --  30*  --  43* 21 47*  CREATININE 3.40*  --  2.30*  --  3.13* 1.78* 3.06*  CALCIUM 9.0  --  8.9  --  9.0 8.6* 9.0  MG  --   --  2.0  --   --   --   --   PHOS 4.6 3.0  --  4.2  --  2.8 4.2   GFR: Estimated Creatinine Clearance: 17.5 mL/min (A) (by C-G formula based on SCr of 3.06 mg/dL (H)). Liver Function Tests: No results for input(s): AST, ALT, ALKPHOS, BILITOT, PROT, ALBUMIN in the last 168 hours. No results for input(s): LIPASE, AMYLASE in the last 168 hours. Recent Labs  Lab 08/20/19 1427  AMMONIA <9*   Coagulation Profile: No results for input(s): INR, PROTIME in the last 168 hours. Cardiac Enzymes: No results for input(s): CKTOTAL, CKMB, CKMBINDEX, TROPONINI in the last 168 hours. BNP (last 3 results) No results for input(s): PROBNP in the last 8760 hours. HbA1C: No results for input(s): HGBA1C in the last 72 hours. CBG: Recent Labs  Lab 08/25/19 0049 08/25/19 0458 08/25/19 0752 08/25/19 1130 08/25/19 1713  GLUCAP 159* 145* 133* 132* 109*   Lipid Profile: No results for input(s): CHOL, HDL, LDLCALC, TRIG, CHOLHDL, LDLDIRECT in the last 72  hours. Thyroid Function Tests: No results for input(s): TSH, T4TOTAL, FREET4, T3FREE, THYROIDAB in the last 72 hours. Anemia Panel: No results for input(s): VITAMINB12, FOLATE, FERRITIN, TIBC, IRON, RETICCTPCT in the last 72 hours. Sepsis Labs: No results for input(s): PROCALCITON, LATICACIDVEN in the last 168 hours.  No results found for this or any previous visit (from the past 240 hour(s)).       Radiology Studies: Dg Swallowing Func-speech Pathology  Result Date: 08/25/2019 Objective Swallowing Evaluation: Type of Study: MBS-Modified Barium Swallow Study  Patient Details Name: JAIMY OGUIN MRN: [id number] Date of Birth: 09/30/1946 Today's Date: 08/25/2019 Time: SLP Start Time (ACUTE ONLY): 1046 -SLP Stop Time (ACUTE ONLY): 1103 SLP Time Calculation (min) (ACUTE ONLY): 17 min Past Medical History: Past Medical History: Diagnosis Date  Anemia   Chronic diastolic CHF (congestive heart failure) (HCC)   CKD (chronic kidney disease), stage IV (HCC)   Hypertension   Persistent atrial fibrillation ([place])   a. dx 01/2019 s/p TEE DCCV.  Renal  disorder   Unilateral congenital absence of kidney  Past Surgical History: Past Surgical History: Procedure Laterality Date  CARDIOVERSION N/A 01/31/2019  Procedure: CARDIOVERSION;  Surgeon: Lelon Perla, MD;  Location: [facility] ENDOSCOPY;  Service: Cardiovascular;  Laterality: N/A;  IR FLUORO GUIDE CV LINE RIGHT  08/23/2019  IR US GUIDE VASC ACCESS RIGHT  08/23/2019  TEE WITHOUT CARDIOVERSION N/A 01/31/2019  Procedure: TRANSESOPHAGEAL ECHOCARDIOGRAM (TEE);  Surgeon: Lelon Perla, MD;  Location: [facility] ENDOSCOPY;  Service: Cardiovascular;  Laterality: N/A; HPI: Pt is a 73 yo F admitted for witnessed out of hospital cardiac arrest, unclear etiology, with field ROSC, resultant respiratory failure and encephalopathy s/p TTM; ETT 10/22-11/1. CRRT initiated 10/30. PMH: unilateral congenital absence of kidney, Afib, HTN, CKD, CHF, anemia, mood disorder.  MRI on  11/7 revealed "Several scattered small acute infarcts in the white matter of the superior frontal lobes, left occipital lobe. No associated hemorrhage or mass effect. 2. Several small subacute or perhaps chronic infarcts in the bilateral cerebellum."  Subjective: Pt awake, alert, pleasant, participative.  SO present for evaluation Assessment / Plan / Recommendation CHL IP CLINICAL IMPRESSIONS 08/25/2019 Clinical Impression Pt presents with a predominantly oral dysphagia c/b incomplete labial closure, impaired mastication, reduced lingual strength and coordination, and premature spillage.  There was anterior spillage of thin liquid.  Pt was unable to siphon from straw.  With regular and simulated ground consistency solids there was oral residue. With pill simulation, pt was unable to orally transit tablet in puree or with liquid wash and tablet was expectorated on both trials. Pharyngeal phase was largely functional, with swallow intiation at the vallecula.  There was trace transient penetration of the thin liquid wash used during pill simulation only, which is suspected to be 2/2 mixed consistency nature of bolus trial.  Recommend puree diet with thin liquid by cup. SLP Visit Diagnosis Dysphagia, oropharyngeal phase (R13.12) Attention and concentration deficit following -- Frontal lobe and executive function deficit following -- Impact on safety and function Mild aspiration risk   CHL IP TREATMENT RECOMMENDATION 08/25/2019 Treatment Recommendations Therapy as outlined in treatment plan below   Prognosis 08/25/2019 Prognosis for Safe Diet Advancement Good Barriers to Reach Goals Cognitive deficits Barriers/Prognosis Comment -- CHL IP DIET RECOMMENDATION 08/25/2019 SLP Diet Recommendations Dysphagia 1 (Puree) solids;Thin liquid Liquid Administration via Cup Medication Administration Crushed with puree Compensations Minimize environmental distractions;Slow rate;Small sips/bites Postural Changes Seated upright at 90  degrees   CHL IP OTHER RECOMMENDATIONS 08/25/2019 Recommended Consults -- Oral Care Recommendations Oral care BID Other Recommendations --   CHL IP FOLLOW UP RECOMMENDATIONS 08/25/2019 Follow up Recommendations Skilled Nursing facility;24 hour supervision/assistance   CHL IP FREQUENCY AND DURATION 08/25/2019 Speech Therapy Frequency (ACUTE ONLY) min 2x/week Treatment Duration 2 weeks      CHL IP ORAL PHASE 08/25/2019 Oral Phase Impaired Oral - Pudding Teaspoon -- Oral - Pudding Cup -- Oral - Honey Teaspoon -- Oral - Honey Cup -- Oral - Nectar Teaspoon -- Oral - Nectar Cup -- Oral - Nectar Straw -- Oral - Thin Teaspoon -- Oral - Thin Cup Left anterior bolus loss;Right anterior bolus loss;Pocketing in anterior sulcus Oral - Thin Straw -- Oral - Puree Premature spillage;Decreased bolus cohesion Oral - Mech Soft Impaired mastication;Weak lingual manipulation;Reduced posterior propulsion;Lingual/palatal residue;Decreased bolus cohesion;Premature spillage Oral - Regular Impaired mastication;Weak lingual manipulation;Reduced posterior propulsion;Lingual/palatal residue;Decreased bolus cohesion;Premature spillage Oral - Multi-Consistency -- Oral - Pill Reduced posterior propulsion;Holding of bolus Oral Phase - Comment --  CHL IP PHARYNGEAL PHASE 08/25/2019 Pharyngeal  Phase Impaired Pharyngeal- Pudding Teaspoon -- Pharyngeal -- Pharyngeal- Pudding Cup -- Pharyngeal -- Pharyngeal- Honey Teaspoon -- Pharyngeal -- Pharyngeal- Honey Cup -- Pharyngeal -- Pharyngeal- Nectar Teaspoon -- Pharyngeal -- Pharyngeal- Nectar Cup -- Pharyngeal -- Pharyngeal- Nectar Straw -- Pharyngeal -- Pharyngeal- Thin Teaspoon -- Pharyngeal -- Pharyngeal- Thin Cup Delayed swallow initiation-vallecula;Pharyngeal residue - valleculae Pharyngeal Material does not enter airway Pharyngeal- Thin Straw -- Pharyngeal -- Pharyngeal- Puree Delayed swallow initiation-vallecula Pharyngeal Material does not enter airway Pharyngeal- Mechanical Soft Delayed swallow  initiation-vallecula Pharyngeal Material does not enter airway Pharyngeal- Regular Delayed swallow initiation-vallecula Pharyngeal Material does not enter airway Pharyngeal- Multi-consistency -- Pharyngeal -- Pharyngeal- Pill Delayed swallow initiation-pyriform sinuses Pharyngeal Material enters airway, remains ABOVE vocal cords then ejected out Pharyngeal Comment --  CHL IP CERVICAL ESOPHAGEAL PHASE 08/25/2019 Cervical Esophageal Phase WFL Pudding Teaspoon -- Pudding Cup -- Honey Teaspoon -- Honey Cup -- Nectar Teaspoon -- Nectar Cup -- Nectar Straw -- Thin Teaspoon -- Thin Cup -- Thin Straw -- Puree -- Mechanical Soft -- Regular -- Multi-consistency -- Pill -- Cervical Esophageal Comment -- Celedonio Savage , MA, CCC-SLP Acute Rehabilitation Services Office: [phone] 08/25/2019, 11:38 AM                   Scheduled Meds:   stroke: mapping our early stages of recovery book   Does not apply Once   sodium chloride   Intravenous Once   ARIPiprazole  10 mg Per Tube BID   atorvastatin  20 mg Per NG tube Daily   chlorhexidine  15 mL Mouth Rinse BID   Chlorhexidine Gluconate Cloth  6 each Topical Q0600   Chlorhexidine Gluconate Cloth  6 each Topical Q0600   feeding supplement (NEPRO CARB STEADY)  237 mL Oral BID BM   ferrous sulfate  300 mg Per Tube Q breakfast   insulin aspart  0-9 Units Subcutaneous Q4H   mouth rinse  15 mL Mouth Rinse BID   midodrine  10 mg Per Tube TID WC   multivitamin  1 tablet Oral QHS   pantoprazole (PROTONIX) IV  40 mg Intravenous Q24H   sodium chloride flush  10-40 mL Intracatheter Q12H   Thrombi-Pad  1 each Topical Once   Continuous Infusions:  sodium chloride 10 mL (08/20/19 2025)   sodium chloride     sodium chloride     amiodarone 30 mg/hr (08/26/19 0351)    ceFAZolin (ANCEF) IV 2 g (08/23/19 1551)   feeding supplement (OSMOLITE 1.5 CAL) 1,000 mL (08/25/19 1952)   heparin 1,150 Units/hr (08/26/19 0701)   valproate sodium 250 mg  (08/26/19 0528)    Assessment & Plan:   Principal Problem:   MSSA bacteremia Active Problems:   Cardiac arrest (HCC)   Pressure injury of skin   Acute respiratory failure with hypoxia (HCC)   AKI (acute kidney injury) (HCC)   Elevated troponin   Persistent atrial fibrillation (HCC)   Acute on chronic combined systolic and diastolic CHF (congestive heart failure) (HCC)   Acute bacterial endocarditis   Malnutrition of moderate degree   Advanced care planning/counseling discussion   Goals of care, counseling/discussion   Palliative care by specialist   Closed fracture of multiple ribs   Cerebral embolism with cerebral infarction   Acute hypoxic respiratory failure due to cardiac arrest and pulmonary edema/bilateral pleural effusions:  Presented after out of hospital cardiac arrest. -Wasintubated ands/pextubated,on nasal cannula O2 -Overall improving -Continue chest physiotherapy and incentive spirometry if patient can tolerate. -Volume status  management by nephrology through hemodialysis     Improvingacute metabolic encephalopathy suspect multifactorial with recent multiple acute CVA:In the setting of cardiac arrest hypothermia, MSSA bacteremia, prolonged hospitalization, versus others. CT head 08/16/2019 stable. On 08/22/2019 mental status was improved, continues to be improving today. On valproic acid as longstanding medications.  EEG with no evidence of seizure activity. Palliative care team following. MRI done on 08/19/2019 showed several scattered small acute infarcts in the white matter of the superior frontal lobes, left occipital lobe. No associated hemorrhage or mass-effect. Also several small subacute or perhaps chronic infarcts in the bilateral cerebellum. Neurology on board. -TTE-no evidence of vegetation or thrombus. -CT head without contrast on 08/21/2019 no sign of intracranial acute hemorrhage or acute ischemia. -Overall  improving   Multiple acute/subacute CVA involving white matter of the superior frontal lobes and left occipital lobe No hemorrhage, on heparin drip, neurology following.  Neuro had okayed for anticoagulation to be started previously   Witnessed cardiac arrest in the setting of history of chronic atrial fibrillation and coronary artery disease:  Patient treated with hypothermia protocol. -LHC initially deferred due to AoCKD in an attempt to avoid HD.  EF initially down to 20-25% 08/03/2019, improved to 55-60% on echo 08/21/2019.  -cards plans to discuss ischemic evaluation with Dr. Meda Coffee now that patient is on HD-final decision still pending   Afib- with intermittent rvr Was SR, went back to afib rvr, cards was reconsulted.  Rate is better controlled today.   Cardiology following.  Recommend continuing amiodarone drip.   On heparin drip with plans to transition to Eliquis 5 mg twice daily prior to discharge for stroke prevention.   Could consider TEE/DCCV if A. fib flutter persist. ?  Had TEE/DCCV earlier this year.    Acute on chronic systolic heart failure:  TTE on 10/22 with EF 20%.  Repeat TEE with improved ejection fraction to 60%.  CAD evaluation pending , initially held due to renal issues now on hemodialysis cards following   MSSA bacteremia Repeat cultures negative.  Seen by infectious diseaserecommended cefazolin until 12/7. TTEno evidence of endocarditis or thrombus.  Acute blood loss anemia No sign of overt bleeding S/p Transfused 1 unit PRBC during this hospitalization Currently H&H stable  AKI on CKD stage IV: Suspected AKI likely ATN in the setting of shock ,cardiac arrest and bacteremia.  Started on CRRTinitially.10/30-11/1 -Now on hemodialysis.  -Nephrology following. -Kidney ultrasound showed solitary right kidney with cortical thinning and cysts. -Status post tunneled hemodialysis catheter placed - Foley or still keep it for I's and  O's Per nephrology, evalu. For possible AVF or AVG, too debilitated currently -Spoke to nephrology Dr. Joelyn Oms he is okay to discontinue Foley okay to discontinue Foley   Hyperkalemia Management per nephrology with HD  Nutrition on tube feeding via core track tube currently. Speech therapy rec. Dysphagia 1 (Puree) solids; thin liquid Needs full assist for feeding; seated upright at 90 degrees. RD rec. Nepro Shake po BID Renal MVI daily D/c prostat Transition to nocturnal feedings: Osmolite 1.5 @ 60 ml/hr via cortrak over 12 hour period Continue aspiration precautions  Physical debility/ambulatory dysfunction PT /OT-recommended SNF CSW -LTACH placement.  DVT prophylaxis:Heparin drip Code Status:Full code Family Communication:No one at bedside Disposition Plan:Plan for LTAC, if family on board and insurance auth. received      LOS: 23 days   Time spent: 145 minutes with more than 50% on [place], MD [facility] Pager [phone]  If  7PM-7AM, please contact night-coverage [email or web address] Password [facility] 08/26/2019, 7:49 AM

## 2019-08-27 DIAGNOSIS — J9601 Acute respiratory failure with hypoxia: Secondary | ICD-10-CM | POA: Diagnosis not present

## 2019-08-27 DIAGNOSIS — I5043 Acute on chronic combined systolic (congestive) and diastolic (congestive) heart failure: Secondary | ICD-10-CM | POA: Diagnosis not present

## 2019-08-27 DIAGNOSIS — I469 Cardiac arrest, cause unspecified: Secondary | ICD-10-CM | POA: Diagnosis not present

## 2019-08-27 DIAGNOSIS — R7881 Bacteremia: Secondary | ICD-10-CM | POA: Diagnosis not present

## 2019-08-27 DIAGNOSIS — B9561 Methicillin susceptible Staphylococcus aureus infection as the cause of diseases classified elsewhere: Secondary | ICD-10-CM | POA: Diagnosis not present

## 2019-08-27 LAB — GLUCOSE, CAPILLARY
Glucose-Capillary: 141 mg/dL — ABNORMAL HIGH (ref 70–99)
Glucose-Capillary: 182 mg/dL — ABNORMAL HIGH (ref 70–99)

## 2019-08-27 LAB — BASIC METABOLIC PANEL
Anion gap: 12 (ref 5–15)
BUN: 22 mg/dL (ref 8–23)
CO2: 27 mmol/L (ref 22–32)
Calcium: 8.8 mg/dL — ABNORMAL LOW (ref 8.9–10.3)
Chloride: 96 mmol/L — ABNORMAL LOW (ref 98–111)
Creatinine, Ser: 2.11 mg/dL — ABNORMAL HIGH (ref 0.44–1.00)
GFR calc Af Amer: 26 mL/min — ABNORMAL LOW (ref >60)
GFR calc non Af Amer: 23 mL/min — ABNORMAL LOW (ref >60)
Glucose, Bld: 146 mg/dL — ABNORMAL HIGH (ref 70–99)
Potassium: 4.1 mmol/L (ref 3.5–5.1)
Sodium: 135 mmol/L (ref 135–145)

## 2019-08-27 LAB — CBC
HCT: 22.9 % — ABNORMAL LOW (ref 36.0–46.0)
Hemoglobin: 7.1 g/dL — ABNORMAL LOW (ref 12.0–15.0)
MCH: 28.4 pg (ref 26.0–34.0)
MCHC: 31 g/dL (ref 30.0–36.0)
MCV: 91.6 fL (ref 80.0–100.0)
Platelets: 427 10*3/uL — ABNORMAL HIGH (ref 150–400)
RBC: 2.5 MIL/uL — ABNORMAL LOW (ref 3.87–5.11)
RDW: 17.2 % — ABNORMAL HIGH (ref 11.5–15.5)
WBC: 18.9 10*3/uL — ABNORMAL HIGH (ref 4.0–10.5)
nRBC: 0 % (ref 0.0–0.2)

## 2019-08-27 LAB — HEPARIN LEVEL (UNFRACTIONATED): Heparin Unfractionated: 0.23 IU/mL — ABNORMAL LOW (ref 0.30–0.70)

## 2019-08-27 LAB — PHOSPHORUS: Phosphorus: 3.1 mg/dL (ref 2.5–4.6)

## 2019-08-27 MED ORDER — VALPROIC ACID 250 MG/5ML PO SOLN
250.0000 mg | Freq: Four times a day (QID) | ORAL | Status: DC
  Administered 2019-08-27 – 2019-09-06 (×36): 250 mg
  Filled 2019-08-27 (×41): qty 5

## 2019-08-27 MED ORDER — HEPARIN (PORCINE) 25000 UT/250ML-% IV SOLN
1300.0000 [IU]/h | INTRAVENOUS | Status: DC
  Administered 2019-08-27 – 2019-08-28 (×2): 1300 [IU]/h via INTRAVENOUS
  Filled 2019-08-27: qty 250

## 2019-08-27 MED ORDER — PANTOPRAZOLE SODIUM 40 MG PO PACK
40.0000 mg | PACK | Freq: Every day | ORAL | Status: DC
  Administered 2019-08-28 – 2019-09-05 (×10): 40 mg
  Filled 2019-08-27 (×12): qty 20

## 2019-08-27 NOTE — Progress Notes (Signed)
[place] for Heparin  Indication: atrial fibrillation  Allergies  Allergen Reactions  . Codeine Other (See Comments)    Reaction not recalled by family- was told to never take this    Patient Measurements: Height: 5\' 6"  (167.6 cm) Weight: 169 lb 8.5 oz (76.9 kg) IBW/kg (Calculated) : 59.3 Heparin Dosing Weight: 78 kg  Vital Signs: Temp: 98.3 F (36.8 C) (11/15 0800) Temp Source: Oral (11/15 0800) BP: 100/57 (11/15 0800) Pulse Rate: 92 (11/15 0800)  Labs: Recent Labs    08/25/19 0017 08/26/19 0404 08/26/19 1419 08/27/19 0320  HGB 8.0* 7.6*  --  7.1*  HCT 25.9* 24.0*  --  22.9*  PLT 453* 445*  --  427*  HEPARINUNFRC 0.33 0.23* 0.31 0.23*  CREATININE 1.78* 3.06*  --  2.11*    Estimated Creatinine Clearance: 25.2 mL/min (A) (by C-G formula based on SCr of 2.11 mg/dL (H)).  Assessment: Pt is a 72 yr old female admitted S/P out of hospital cardiac arrest. ROSC was achieved and targeted temperature management was initiated. This admission has been complicated by continued encephalopathy, AKI on CKD which has advanced to dialysis, and hypotension.    MRI on 11/7 found several small acute infarcts (possibly embolic in nature), though no hemorrhage. Pt continues on heparin for Afib. Plans to change back to apixaban when taking po well  Heparin was stopped this morning around 9 am due to bleeding. IR has seen patient and subsequently stopped bleeding and request to wait to start heparin until 1700. She was previously on 1300 units/hour with close to therapeutic levels.    Goal of Therapy:  Heparin level 0.3-0.5 units/ml Monitor platelets by anticoagulation protocol: Yes   Plan:  No bolus due to bleeding Start heparin infusion at 1300 units/hr at 5 pm on 11/15 Check an 8 hour heparin level Monitor daily heparin level, CBC Monitor for signs of bleeding Follow up when changing back to PTA apixaban    Thank you,   Eddie Candle,  PharmD PGY-1 Pharmacy Resident   Please check amion for clinical pharmacist contact number

## 2019-08-27 NOTE — Progress Notes (Signed)
PROGRESS NOTE    YEVA OBARR  [id number] DOB: 05/26/1946 DOA: 08/03/2019 PCP: Cyndi Bender, PA-C    Brief Narrative:  Patient is 73 year old with history of congenital unilateral kidney, stage IV chronic kidney disease with baseline creatinine about 2.2-2.8, hypertension, persistent A. fib on anticoagulation with Eliquis, chronic diastolic heart failure, history of cardioversion for A. fib admitted to [facility] witnessedcardiac arrest. Patient from home. 10/22 Admitted post arrest, intubated, initiation of targeted temperature management 36 celsius.  ECHO: LVEF: 20-25%, global hypokinesis, RV midly reduced function with normal size, mild MR, normal pulmonary pressures. Lactate cleared 10/25 - staph identified in blood and vanc started. Still with poor Ur OP. On amio gtt. Off leveophed and neoOn Heparin gtt. iHD w/ 1L off 10/26- following simple commands, placed on precedex for agitation, febrile- re-cultured and femoral CVL discontinued  10/28- converted to NSR, TEE  10/30 - CRRT for volume removal Blood culture 10/22 >>MSSA 1/4 COVID 10/22 >> Negative RVP 10/22 >> Negative Urine 10/22 - E colii 10/26 BCx 2 >> neg Remains in very poor mentation    Consultants:  Nephrology, interventional radiology, neurology, palliative care  RD, speech pathology  Procedures: -placement of tunneled HD catheter, CRRT, HD  Echo 08/21/19 1. Left ventricular ejection fraction, by visual estimation, is 55 to 60%. The left ventricle has normal function. There is moderately increased left ventricular hypertrophy. 2. Left ventricular diastolic parameters are indeterminate. 3. Global right ventricle has normal systolic function.The right ventricular size is normal. No increase in right ventricular wall thickness. 4. Left atrial size was moderately dilated. 5. Right atrial size was mildly dilated. 6. Trivial pericardial effusion is present. 7. The mitral valve is  normal in structure. Trace mitral valve regurgitation. 8. The tricuspid valve is normal in structure. Tricuspid valve regurgitation is mild. 9. The aortic valve is tricuspid. Aortic valve regurgitation is not visualized. 10. The pulmonic valve was not well visualized. Pulmonic valve regurgitation is not visualized. 11. The tricuspid regurgitant velocity is 2.79 m/s, and with an assumed right atrial pressure of 8 mmHg, the estimated right ventricular systolic pressure is mildly elevated at 39.1 mmHg. 12. The inferior vena cava is normal in size with <50% respiratory variability, suggesting right atrial pressure of 8 mmHg. 13. The interatrial septum was not well visualized.  Antimicrobials: Rocephin 10/22 >> 10/25 Vanc (staph in blood ) 10/24  Cefazolin 10/25 >>   Subjective: Patient asking to eat.  But bleeding from [facility] site and nsg holding area until IR comes to evaluate for possible injection. Denies sob, or any other complaints.  Objective: Vitals:   08/26/19 1829 08/26/19 2043 08/27/19 0007 08/27/19 0435  BP: 110/62 104/63 (!) 105/48 (!) 115/50  Pulse: 96 (!) 114 (!) 104 99  Resp: (!) 34 (!) 48 (!) 33 (!) 22  Temp:  98.3 F (36.8 C) 98.5 F (36.9 C) 98.2 F (36.8 C)  TempSrc:  Oral Oral Oral  SpO2: 92% 95% 98% 100%  Weight:    76.9 kg  Height:        Intake/Output Summary (Last 24 hours) at 08/27/2019 0800 Last data filed at 08/26/2019 1800 Gross per 24 hour  Intake 10 ml  Output 1960 ml  Net -1950 ml   Filed Weights   08/26/19 0730 08/26/19 1149 08/27/19 0435  Weight: 77.9 kg 75.8 kg 76.9 kg    Examination: General exam:Appears calm, NAD, sitting in bed.  HEENT:EOMI, NG tube in place Respiratory system: Clear to auscultation anteriorly anteriorly, no wheezing or rhonchi  Cardiovascular system:S1 &S2 heard, RRR. No murmurs, rubs, gallops or clicks.  Right chest:HD cath oozing from padding. Pressure being held Gastrointestinal system:Abdomen is  nondistended, soft and nontender. No organomegaly or masses felt. Normal bowel sounds heard. Central nervous system:awake, cooperative with exam, conversates Extremities:Trace pedal edema bilaterally Skin:Warm and dry Psychiatry:Mood &affect appears appropriate  Data Reviewed: I have personally reviewed following labs and imaging studies  CBC: Recent Labs  Lab 08/23/19 0500 08/24/19 0413 08/25/19 0017 08/26/19 0404 08/27/19 0320  WBC 15.6* 15.4* 15.8* 16.0* 18.9*  HGB 7.8* 8.2* 8.0* 7.6* 7.1*  HCT 25.1* 25.8* 25.9* 24.0* 22.9*  MCV 94.0 89.6 91.2 90.9 91.6  PLT 386 410* 453* 445* [id number]*   Basic Metabolic Panel: Recent Labs  Lab 08/23/19 0500 08/23/19 1154 08/24/19 0413 08/24/19 1119 08/25/19 0017 08/26/19 0404 08/27/19 0320  NA  --  135  --  130* 135 135 135  K  --  4.6  --  5.3* 4.2 5.1 4.1  CL  --  95*  --  91* 96* 94* 96*  CO2  --  28  --  24 25 26 27   GLUCOSE  --  148*  --  157* 133* 131* 146*  BUN  --  30*  --  43* 21 47* 22  CREATININE  --  2.30*  --  3.13* 1.78* 3.06* 2.11*  CALCIUM  --  8.9  --  9.0 8.6* 9.0 8.8*  MG  --  2.0  --   --   --   --   --   PHOS 3.0  --  4.2  --  2.8 4.2 3.1   GFR: Estimated Creatinine Clearance: 25.2 mL/min (A) (by C-G formula based on SCr of 2.11 mg/dL (H)). Liver Function Tests: No results for input(s): AST, ALT, ALKPHOS, BILITOT, PROT, ALBUMIN in the last 168 hours. No results for input(s): LIPASE, AMYLASE in the last 168 hours. Recent Labs  Lab 08/20/19 1427  AMMONIA <9*   Coagulation Profile: No results for input(s): INR, PROTIME in the last 168 hours. Cardiac Enzymes: No results for input(s): CKTOTAL, CKMB, CKMBINDEX, TROPONINI in the last 168 hours. BNP (last 3 results) No results for input(s): PROBNP in the last 8760 hours. HbA1C: No results for input(s): HGBA1C in the last 72 hours. CBG: Recent Labs  Lab 08/25/19 0752 08/25/19 1130 08/25/19 1713 08/26/19 2042 08/27/19 0756  GLUCAP 133* 132* 109* 125*  141*   Lipid Profile: No results for input(s): CHOL, HDL, LDLCALC, TRIG, CHOLHDL, LDLDIRECT in the last 72 hours. Thyroid Function Tests: No results for input(s): TSH, T4TOTAL, FREET4, T3FREE, THYROIDAB in the last 72 hours. Anemia Panel: No results for input(s): VITAMINB12, FOLATE, FERRITIN, TIBC, IRON, RETICCTPCT in the last 72 hours. Sepsis Labs: No results for input(s): PROCALCITON, LATICACIDVEN in the last 168 hours.  No results found for this or any previous visit (from the past 240 hour(s)).       Radiology Studies: Dg Swallowing Func-speech Pathology  Result Date: 08/25/2019 Objective Swallowing Evaluation: Type of Study: MBS-Modified Barium Swallow Study  Patient Details Name: DEVORA VENCES MRN: [id number] Date of Birth: 04/11/1946 Today's Date: 08/25/2019 Time: SLP Start Time (ACUTE ONLY): 1046 -SLP Stop Time (ACUTE ONLY): 1103 SLP Time Calculation (min) (ACUTE ONLY): 17 min Past Medical History: Past Medical History: Diagnosis Date  Anemia   Chronic diastolic CHF (congestive heart failure) (HCC)   CKD (chronic kidney disease), stage IV (HCC)   Hypertension   Persistent atrial fibrillation ([place])   a. dx  01/2019 s/p TEE DCCV.  Renal disorder   Unilateral congenital absence of kidney  Past Surgical History: Past Surgical History: Procedure Laterality Date  CARDIOVERSION N/A 01/31/2019  Procedure: CARDIOVERSION;  Surgeon: Lelon Perla, MD;  Location: [facility] ENDOSCOPY;  Service: Cardiovascular;  Laterality: N/A;  IR FLUORO GUIDE CV LINE RIGHT  08/23/2019  IR US GUIDE VASC ACCESS RIGHT  08/23/2019  TEE WITHOUT CARDIOVERSION N/A 01/31/2019  Procedure: TRANSESOPHAGEAL ECHOCARDIOGRAM (TEE);  Surgeon: Lelon Perla, MD;  Location: [facility] ENDOSCOPY;  Service: Cardiovascular;  Laterality: N/A; HPI: Pt is a 73 yo F admitted for witnessed out of hospital cardiac arrest, unclear etiology, with field ROSC, resultant respiratory failure and encephalopathy s/p TTM; ETT 10/22-11/1. CRRT  initiated 10/30. PMH: unilateral congenital absence of kidney, Afib, HTN, CKD, CHF, anemia, mood disorder.  MRI on 11/7 revealed "Several scattered small acute infarcts in the white matter of the superior frontal lobes, left occipital lobe. No associated hemorrhage or mass effect. 2. Several small subacute or perhaps chronic infarcts in the bilateral cerebellum."  Subjective: Pt awake, alert, pleasant, participative.  SO present for evaluation Assessment / Plan / Recommendation CHL IP CLINICAL IMPRESSIONS 08/25/2019 Clinical Impression Pt presents with a predominantly oral dysphagia c/b incomplete labial closure, impaired mastication, reduced lingual strength and coordination, and premature spillage.  There was anterior spillage of thin liquid.  Pt was unable to siphon from straw.  With regular and simulated ground consistency solids there was oral residue. With pill simulation, pt was unable to orally transit tablet in puree or with liquid wash and tablet was expectorated on both trials. Pharyngeal phase was largely functional, with swallow intiation at the vallecula.  There was trace transient penetration of the thin liquid wash used during pill simulation only, which is suspected to be 2/2 mixed consistency nature of bolus trial.  Recommend puree diet with thin liquid by cup. SLP Visit Diagnosis Dysphagia, oropharyngeal phase (R13.12) Attention and concentration deficit following -- Frontal lobe and executive function deficit following -- Impact on safety and function Mild aspiration risk   CHL IP TREATMENT RECOMMENDATION 08/25/2019 Treatment Recommendations Therapy as outlined in treatment plan below   Prognosis 08/25/2019 Prognosis for Safe Diet Advancement Good Barriers to Reach Goals Cognitive deficits Barriers/Prognosis Comment -- CHL IP DIET RECOMMENDATION 08/25/2019 SLP Diet Recommendations Dysphagia 1 (Puree) solids;Thin liquid Liquid Administration via Cup Medication Administration Crushed with puree  Compensations Minimize environmental distractions;Slow rate;Small sips/bites Postural Changes Seated upright at 90 degrees   CHL IP OTHER RECOMMENDATIONS 08/25/2019 Recommended Consults -- Oral Care Recommendations Oral care BID Other Recommendations --   CHL IP FOLLOW UP RECOMMENDATIONS 08/25/2019 Follow up Recommendations Skilled Nursing facility;24 hour supervision/assistance   CHL IP FREQUENCY AND DURATION 08/25/2019 Speech Therapy Frequency (ACUTE ONLY) min 2x/week Treatment Duration 2 weeks      CHL IP ORAL PHASE 08/25/2019 Oral Phase Impaired Oral - Pudding Teaspoon -- Oral - Pudding Cup -- Oral - Honey Teaspoon -- Oral - Honey Cup -- Oral - Nectar Teaspoon -- Oral - Nectar Cup -- Oral - Nectar Straw -- Oral - Thin Teaspoon -- Oral - Thin Cup Left anterior bolus loss;Right anterior bolus loss;Pocketing in anterior sulcus Oral - Thin Straw -- Oral - Puree Premature spillage;Decreased bolus cohesion Oral - Mech Soft Impaired mastication;Weak lingual manipulation;Reduced posterior propulsion;Lingual/palatal residue;Decreased bolus cohesion;Premature spillage Oral - Regular Impaired mastication;Weak lingual manipulation;Reduced posterior propulsion;Lingual/palatal residue;Decreased bolus cohesion;Premature spillage Oral - Multi-Consistency -- Oral - Pill Reduced posterior propulsion;Holding of bolus Oral Phase - Comment --  CHL IP PHARYNGEAL PHASE 08/25/2019 Pharyngeal Phase Impaired Pharyngeal- Pudding Teaspoon -- Pharyngeal -- Pharyngeal- Pudding Cup -- Pharyngeal -- Pharyngeal- Honey Teaspoon -- Pharyngeal -- Pharyngeal- Honey Cup -- Pharyngeal -- Pharyngeal- Nectar Teaspoon -- Pharyngeal -- Pharyngeal- Nectar Cup -- Pharyngeal -- Pharyngeal- Nectar Straw -- Pharyngeal -- Pharyngeal- Thin Teaspoon -- Pharyngeal -- Pharyngeal- Thin Cup Delayed swallow initiation-vallecula;Pharyngeal residue - valleculae Pharyngeal Material does not enter airway Pharyngeal- Thin Straw -- Pharyngeal -- Pharyngeal- Puree Delayed  swallow initiation-vallecula Pharyngeal Material does not enter airway Pharyngeal- Mechanical Soft Delayed swallow initiation-vallecula Pharyngeal Material does not enter airway Pharyngeal- Regular Delayed swallow initiation-vallecula Pharyngeal Material does not enter airway Pharyngeal- Multi-consistency -- Pharyngeal -- Pharyngeal- Pill Delayed swallow initiation-pyriform sinuses Pharyngeal Material enters airway, remains ABOVE vocal cords then ejected out Pharyngeal Comment --  CHL IP CERVICAL ESOPHAGEAL PHASE 08/25/2019 Cervical Esophageal Phase WFL Pudding Teaspoon -- Pudding Cup -- Honey Teaspoon -- Honey Cup -- Nectar Teaspoon -- Nectar Cup -- Nectar Straw -- Thin Teaspoon -- Thin Cup -- Thin Straw -- Puree -- Mechanical Soft -- Regular -- Multi-consistency -- Pill -- Cervical Esophageal Comment -- Celedonio Savage , MA, CCC-SLP Acute Rehabilitation Services Office: [phone] 08/25/2019, 11:38 AM                   Scheduled Meds:   stroke: mapping our early stages of recovery book   Does not apply Once   sodium chloride   Intravenous Once   ARIPiprazole  10 mg Per Tube BID   atorvastatin  20 mg Per NG tube Daily   chlorhexidine  15 mL Mouth Rinse BID   Chlorhexidine Gluconate Cloth  6 each Topical Q0600   Chlorhexidine Gluconate Cloth  6 each Topical Q0600   feeding supplement (NEPRO CARB STEADY)  237 mL Oral BID BM   ferrous sulfate  300 mg Per Tube Q breakfast   insulin aspart  0-9 Units Subcutaneous Q4H   mouth rinse  15 mL Mouth Rinse BID   midodrine  10 mg Per Tube TID WC   multivitamin  1 tablet Oral QHS   pantoprazole (PROTONIX) IV  40 mg Intravenous Q24H   sodium chloride flush  10-40 mL Intracatheter Q12H   Thrombi-Pad  1 each Topical Once   Continuous Infusions:  sodium chloride 10 mL (08/20/19 2025)   sodium chloride     sodium chloride     amiodarone 30 mg/hr (08/27/19 0217)    ceFAZolin (ANCEF) IV Stopped (08/26/19 1151)   feeding supplement  (OSMOLITE 1.5 CAL) 1,000 mL (08/26/19 2156)   heparin 1,300 Units/hr (08/27/19 0547)   valproate sodium 250 mg (08/27/19 0553)    Assessment & Plan:   Principal Problem:   MSSA bacteremia Active Problems:   Cardiac arrest (HCC)   Pressure injury of skin   Acute respiratory failure with hypoxia (HCC)   AKI (acute kidney injury) (HCC)   Elevated troponin   Persistent atrial fibrillation (HCC)   Acute on chronic combined systolic and diastolic CHF (congestive heart failure) (HCC)   Acute bacterial endocarditis   Malnutrition of moderate degree   Advanced care planning/counseling discussion   Goals of care, counseling/discussion   Palliative care by specialist   Closed fracture of multiple ribs   Cerebral embolism with cerebral infarction   Acute hypoxic respiratory failure due to cardiac arrest and pulmonary edema/bilateral pleural effusions:  Presented after out of hospital cardiac arrest. -Wasintubated ands/pextubated,on nasal cannula O2 -Improved, nephrology mx vol. Status via HD -Continue chest  physiotherapy and incentive spirometry if patient can tolerate.      Improvingacute metabolic encephalopathy suspect multifactorial with recent multiple acute CVA:In the setting of cardiac arrest hypothermia, MSSA bacteremia, prolonged hospitalization, versus others. CT head 08/16/2019 stable. On 08/22/2019 mental status was improved, continues to be improving today. On valproic acid as longstanding medications.  EEG with no evidence of seizure activity. Palliative care team following. MRI done on 08/19/2019 showed several scattered small acute infarcts in the white matter of the superior frontal lobes, left occipital lobe. No associated hemorrhage or mass-effect. Also several small subacute or perhaps chronic infarcts in the bilateral cerebellum. Neurologyon board. -TTE-no evidence of vegetation or thrombus. -CT head without contrast on 08/21/2019 no sign of  intracranial acute hemorrhage or acute ischemia. -Improving daily, more interactive today.   HD cath site bleed -IR and nephrology notified -rec. Holding heparin gtt, plan for possible injection,additional stiches? Will f/u. Pressure will be applied by nsg for now until IR intervention takes place soon   Multiple acute/subacute CVA involving white matter of the superior frontal lobes and left occipital lobe No hemorrhage, on heparin drip, neurology following.  Neuro had okayed for anticoagulation to be started previously   Witnessed cardiac arrest in the setting of history of chronic atrial fibrillation and coronary artery disease: Patient treated with hypothermia protocol. -LHC initially deferred due to AoCKD in an attempt to avoid HD.  EF initially down to 20-25% 08/03/2019, improved to 55-60% on echo 08/21/2019.  -cards plans to discuss ischemic evaluation with Dr. Meda Coffee now that patient is on HD-final decision still pending   Afib- with intermittent rvr Was SR, went back to afib rvr, cards was reconsulted.  Rateis better controlled today.  Cardiology following. Recommend continuing amiodarone drip.  On heparin drip with plans to transition to Eliquis 5 mg twice daily prior to discharge for stroke prevention.  Could consider TEE/DCCV if A. fib flutter persist. ? Had TEE/DCCV earlier this year.    Acute on chronic systolic heart failure: TTE on 10/22 with EF 20%.  Repeat TEE with improved ejection fraction to 60%.  CAD evaluation pending , initially held due to renal issues now on hemodialysis cards following   MSSA bacteremia Repeat cultures negative.  Seen by infectious diseaserecommended cefazolin until 12/7. TTEno evidence of endocarditis or thrombus.  Acute blood loss anemia No sign of overt bleeding before, but mildly bleeding from HD cath site. S/p Transfused 1 unit PRBC during this hospitalization Monitor cbc, if <7 will transfuse a unit  prbc   AKI on CKD stage IV: Suspected AKI likely ATN in the setting of shock ,cardiac arrest and bacteremia.  Started on CRRTinitially.10/30-11/1 -Now on hemodialysis.  -Nephrology following. -Kidney ultrasound showed solitary right kidney with cortical thinning and cysts. -Status post tunneled hemodialysis catheter placed -Foley or still keep it for I's and O's Per nephrology, evalu. For possible AVF or AVG, too debilitated currently -foley d/c'd on 11/14, not much urine output currently  Hyperkalemia Resolved Mx by nephrology with HD   Nutrition on tube feeding via core track tubecurrently. Speech therapy rec. Dysphagia 1 (Puree) solids; thin liquid- tolerating so far. Needs full assist for feeding; seated upright at 90 degrees. RD rec. Nepro Shake poBID Renal MVI daily D/c prostat Transition to nocturnal feedings: Osmolite 1.5@60ml /hr via cortrak over 12 hour period Continue aspiration precautions    Physical debility/ambulatory dysfunction PT /OT-recommended SNF CSW-LTACH placement.  DVT prophylaxis:Heparin drip Code Status:Full code Family Communication:No one at bedside Disposition Plan:Plan for LTAC,  if family on board and insurance auth. received      LOS: 24 days   Time spent: 45 minutes with more than 50% COC    Nolberto Hanlon, MD [facility] Pager [phone]  If 7PM-7AM, please contact night-coverage [email or web address] Password [email or web address] 08/27/2019, 8:00 AM

## 2019-08-27 NOTE — Progress Notes (Addendum)
Assessed TDC site at change of shift. Site is dressed w/ thrombipad per order and slight pressure dressing with gauze. Dressing found to be saturated and site still actively oozing. Patient is on IV heparin due to atrial fibrillation; pharmacy made aware of bleeding issues. Dr. Joelyn Oms made aware via phone.   Notified Dr. Pascal Lux with IR via text page; received call back with orders to raise patient's HOB to at least 45 degrees and hold pressure for 10-15 minutes at catheter site and above catheter site. Dr. Pascal Lux states that PA with IR will be up to evaluate patient's site this morning.  Dr. Joelyn Oms at bedside at 0910; [facility] cath still bleeding and RN holding pressure at this time; verbal orders from Dr. Joelyn Oms to hold IV heparin and notify cardiology of this. Text paged Richardson Dopp PA with update.

## 2019-08-27 NOTE — Progress Notes (Signed)
[facility] NEPHROLOGY PROGRESS NOTE  Assessment/ Plan: Pt is a 73 y.o. yo female with out of hospital cardiac arrest CPR for 10 minutes, history of CKD stage IV, CHF.  She was a started dialysis on 10/25 via right IJ catheter.  Urine culture with E. coli, CRRT initiated on 10/30/120.  #dialysis dep't AoCKD4: CKD due to hypertension.  AKI likely ATN in the setting of shock, cardiac arrest and bacteremia.  She was on CRRT since 10/30-11/1.   -Kidney ultrasound showed solitary right kidney with cortical thinning and cysts. -Seen by palliative, full scope of care, potentially to [facility] - s/p TDC with IR 11/11 - Cont iHD THS Schedule: 2-3L, 3.5h, no hep, 2K bath, TDC.  - cont to eval for possible AVF or AVG, too debilitated currently  #Hyperkalemia: Cont HD, resolved  #Acute encephalopathy: somewhat improved but still present.  11/7 MRI with scattered small acute infarcts and chronic infarcts noted.  #Cardiac arrest, acute on chronic systolic CHF: EF [id number] by echo, cardiology is following.  EF improved in TEE.  #MSSA bacteremia: Continue cefazolin. End date six weeks from last negative culture 08/07/19 - 09/18/19.  #Hypotension: Stable.  Continue midodrine.  #Acute respiratory failure with hypoxia: HD today with UF.  On 2 L oxygen.  #Paroxysmal atrial fibrillation: Cardiology following.  On amiodarone and heparin drip.  Subjective:   Bleeding from [name], IR injected and stitched  HD yesterday 1.9L post weight 75.8kg  Stable labs this AM  Minimal urine output  Objective Vital signs in last 24 hours: Vitals:   08/26/19 2043 08/27/19 0007 08/27/19 0435 08/27/19 0800  BP: 104/63 (!) 105/48 (!) 115/50 (!) 100/57  Pulse: (!) 114 (!) 104 99 92  Resp: (!) 48 (!) 33 (!) 22 (!) 28  Temp: 98.3 F (36.8 C) 98.5 F (36.9 C) 98.2 F (36.8 C) 98.3 F (36.8 C)  TempSrc: Oral Oral Oral Oral  SpO2: 95% 98% 100% 100%  Weight:   76.9 kg   Height:       Weight change: 0.4  kg  Intake/Output Summary (Last 24 hours) at 08/27/2019 1127 Last data filed at 08/26/2019 1800 Gross per 24 hour  Intake 10 ml  Output 1960 ml  Net -1950 ml       Labs: Basic Metabolic Panel: Recent Labs  Lab 08/25/19 0017 08/26/19 0404 08/27/19 0320  NA 135 135 135  K 4.2 5.1 4.1  CL 96* 94* 96*  CO2 25 26 27   GLUCOSE 133* 131* 146*  BUN 21 47* 22  CREATININE 1.78* 3.06* 2.11*  CALCIUM 8.6* 9.0 8.8*  PHOS 2.8 4.2 3.1   Liver Function Tests: No results for input(s): AST, ALT, ALKPHOS, BILITOT, PROT, ALBUMIN in the last 168 hours. No results for input(s): LIPASE, AMYLASE in the last 168 hours. Recent Labs  Lab 08/20/19 1427  AMMONIA <9*   CBC: Recent Labs  Lab 08/23/19 0500 08/24/19 0413 08/25/19 0017 08/26/19 0404 08/27/19 0320  WBC 15.6* 15.4* 15.8* 16.0* 18.9*  HGB 7.8* 8.2* 8.0* 7.6* 7.1*  HCT 25.1* 25.8* 25.9* 24.0* 22.9*  MCV 94.0 89.6 91.2 90.9 91.6  PLT 386 410* 453* 445* 427*   Cardiac Enzymes: No results for input(s): CKTOTAL, CKMB, CKMBINDEX, TROPONINI in the last 168 hours. CBG: Recent Labs  Lab 08/25/19 0752 08/25/19 1130 08/25/19 1713 08/26/19 2042 08/27/19 0756  GLUCAP 133* 132* 109* 125* 141*    Iron Studies:  No results for input(s): IRON, TIBC, TRANSFERRIN, FERRITIN in the last 72 hours. Studies/Results:  No results found.  Medications: Infusions: . sodium chloride 10 mL (08/20/19 2025)  . sodium chloride    . sodium chloride    . amiodarone 30 mg/hr (08/27/19 0217)  .  ceFAZolin (ANCEF) IV Stopped (08/26/19 1151)  . feeding supplement (OSMOLITE 1.5 CAL) 1,000 mL (08/26/19 2156)  . heparin Stopped (08/27/19 0910)    Scheduled Medications: .  stroke: mapping our early stages of recovery book   Does not apply Once  . sodium chloride   Intravenous Once  . ARIPiprazole  10 mg Per Tube BID  . atorvastatin  20 mg Per NG tube Daily  . chlorhexidine  15 mL Mouth Rinse BID  . Chlorhexidine Gluconate Cloth  6 each Topical  Q0600  . Chlorhexidine Gluconate Cloth  6 each Topical Q0600  . feeding supplement (NEPRO CARB STEADY)  237 mL Oral BID BM  . ferrous sulfate  300 mg Per Tube Q breakfast  . insulin aspart  0-9 Units Subcutaneous Q4H  . mouth rinse  15 mL Mouth Rinse BID  . midodrine  10 mg Per Tube TID WC  . multivitamin  1 tablet Oral QHS  . pantoprazole sodium  40 mg Per Tube Daily  . sodium chloride flush  10-40 mL Intracatheter Q12H  . Thrombi-Pad  1 each Topical Once  . valproic acid  250 mg Per Tube Q6H    have reviewed scheduled and prn medications.  Physical Exam: General: Alert awake but remains encephalopathic, unchanged Heart:RRR, s1s2 nl, no rubs Lungs: Bibasal crackles, no increased work of breathing Abdomen:soft, Non-tender, non-distended Extremities: Trace edema Dialysis Access: TDC oozing, pressure dressing on  [facility] 08/27/2019,11:27 AM  LOS: 24 days  Pager: [phone]

## 2019-08-27 NOTE — Progress Notes (Addendum)
ANTICOAGULATION CONSULT NOTE - Follow Up Consult  Pharmacy Consult for heparin Indication: atrial fibrillation  Labs: Recent Labs    08/25/19 0017 08/26/19 0404 08/26/19 1419 08/27/19 0320  HGB 8.0* 7.6*  --  7.1*  HCT 25.9* 24.0*  --  22.9*  PLT 453* 445*  --  427*  HEPARINUNFRC 0.33 0.23* 0.31 0.23*  CREATININE 1.78* 3.06*  --  2.11*    Assessment: 73yo female subtherapeutic on heparin with lower heparin level despite rate increase; no gtt issues though RN notes some bleeding from new HD cath site.  Goal of Therapy:  Heparin level 0.3-0.5 units/ml   Plan:  Will increase heparin gtt slightly to 1300 units/hr and check level in 8 hours.    Wynona Neat, PharmD, BCPS  08/27/2019,4:53 AM

## 2019-08-27 NOTE — Progress Notes (Signed)
Received verbal order from Dr. Joelyn Oms  to hold heparin until cardiology see her.  Received verbal order from Dr. Bronson Ing to hold heparin until her bleeding issue from the HD catheter has been resolved.  Heparin has been on hold since 0910 this morning.  Idolina Primer, RN

## 2019-08-27 NOTE — Progress Notes (Signed)
Progress Note  Patient Name: [facility] Date of Encounter: 08/27/2019  Primary Cardiologist: Ena Dawley, MD   Subjective   Nursing holding pressure for bleeding from HD cath site. IR to see. Wants to know when she can go home. Heparin temporarily on hold.  Inpatient Medications    Scheduled Meds:   stroke: mapping our early stages of recovery book   Does not apply Once   sodium chloride   Intravenous Once   ARIPiprazole  10 mg Per Tube BID   atorvastatin  20 mg Per NG tube Daily   chlorhexidine  15 mL Mouth Rinse BID   Chlorhexidine Gluconate Cloth  6 each Topical Q0600   Chlorhexidine Gluconate Cloth  6 each Topical Q0600   feeding supplement (NEPRO CARB STEADY)  237 mL Oral BID BM   ferrous sulfate  300 mg Per Tube Q breakfast   insulin aspart  0-9 Units Subcutaneous Q4H   mouth rinse  15 mL Mouth Rinse BID   midodrine  10 mg Per Tube TID WC   multivitamin  1 tablet Oral QHS   pantoprazole (PROTONIX) IV  40 mg Intravenous Q24H   sodium chloride flush  10-40 mL Intracatheter Q12H   Thrombi-Pad  1 each Topical Once   Continuous Infusions:  sodium chloride 10 mL (08/20/19 2025)   sodium chloride     sodium chloride     amiodarone 30 mg/hr (08/27/19 0217)    ceFAZolin (ANCEF) IV Stopped (08/26/19 1151)   feeding supplement (OSMOLITE 1.5 CAL) 1,000 mL (08/26/19 2156)   heparin Stopped (08/27/19 0910)   valproate sodium 250 mg (08/27/19 0553)   PRN Meds: sodium chloride, sodium chloride, acetaminophen (TYLENOL) oral liquid 160 mg/5 mL, alteplase, heparin, hydrALAZINE, HYDROmorphone (DILAUDID) injection, levalbuterol, lidocaine (PF), lidocaine-prilocaine, pentafluoroprop-tetrafluoroeth, sodium chloride flush   Vital Signs    Vitals:   08/26/19 2043 08/27/19 0007 08/27/19 0435 08/27/19 0800  BP: 104/63 (!) 105/48 (!) 115/50 (!) 100/57  Pulse: (!) 114 (!) 104 99 92  Resp: (!) 48 (!) 33 (!) 22 (!) 28  Temp: 98.3 F (36.8 C) 98.5 F  (36.9 C) 98.2 F (36.8 C) 98.3 F (36.8 C)  TempSrc: Oral Oral Oral Oral  SpO2: 95% 98% 100% 100%  Weight:   76.9 kg   Height:        Intake/Output Summary (Last 24 hours) at 08/27/2019 1009 Last data filed at 08/26/2019 1800 Gross per 24 hour  Intake 10 ml  Output 1960 ml  Net -1950 ml   Filed Weights   08/26/19 0730 08/26/19 1149 08/27/19 0435  Weight: 77.9 kg 75.8 kg 76.9 kg    Telemetry    A fib, 90-low 100's - Personally Reviewed  Physical Exam   GEN: No acute distress.   Neck: No JVD Cardiac: Irregular, no murmurs, rubs, or gallops.  Respiratory: Clear to auscultation bilaterally. GI: Soft, nontender, non-distended  MS: No edema; No deformity. Neuro:  Nonfocal  Psych: Anxious  Labs    Chemistry Recent Labs  Lab 08/25/19 0017 08/26/19 0404 08/27/19 0320  NA 135 135 135  K 4.2 5.1 4.1  CL 96* 94* 96*  CO2 25 26 27   GLUCOSE 133* 131* 146*  BUN 21 47* 22  CREATININE 1.78* 3.06* 2.11*  CALCIUM 8.6* 9.0 8.8*  GFRNONAA 28* 15* 23*  GFRAA 32* 17* 26*  ANIONGAP 14 15 12      Hematology Recent Labs  Lab 08/25/19 0017 08/26/19 0404 08/27/19 0320  WBC 15.8* 16.0* 18.9*  RBC 2.84* 2.64* 2.50*  HGB 8.0* 7.6* 7.1*  HCT 25.9* 24.0* 22.9*  MCV 91.2 90.9 91.6  MCH 28.2 28.8 28.4  MCHC 30.9 31.7 31.0  RDW 16.7* 17.0* 17.2*  PLT 453* 445* 427*    Cardiac EnzymesNo results for input(s): TROPONINI in the last 168 hours. No results for input(s): TROPIPOC in the last 168 hours.   BNPNo results for input(s): BNP, PROBNP in the last 168 hours.   DDimer No results for input(s): DDIMER in the last 168 hours.   Radiology    Dg Swallowing Func-speech Pathology  Result Date: 08/25/2019 Objective Swallowing Evaluation: Type of Study: MBS-Modified Barium Swallow Study  Patient Details Name: AZALIA TOCZEK MRN: [id number] Date of Birth: [birth year] Today's Date: 08/25/2019 Time: SLP Start Time (ACUTE ONLY): 1046 -SLP Stop Time (ACUTE ONLY): 1103 SLP Time  Calculation (min) (ACUTE ONLY): 17 min Past Medical History: Past Medical History: Diagnosis Date  Anemia   Chronic diastolic CHF (congestive heart failure) (HCC)   CKD (chronic kidney disease), stage IV (HCC)   Hypertension   Persistent atrial fibrillation ([place])   a. dx 01/2019 s/p TEE DCCV.  Renal disorder   Unilateral congenital absence of kidney  Past Surgical History: Past Surgical History: Procedure Laterality Date  CARDIOVERSION N/A 01/31/2019  Procedure: CARDIOVERSION;  Surgeon: Lelon Perla, MD;  Location: [facility] ENDOSCOPY;  Service: Cardiovascular;  Laterality: N/A;  IR FLUORO GUIDE CV LINE RIGHT  08/23/2019  IR US GUIDE VASC ACCESS RIGHT  08/23/2019  TEE WITHOUT CARDIOVERSION N/A 01/31/2019  Procedure: TRANSESOPHAGEAL ECHOCARDIOGRAM (TEE);  Surgeon: Lelon Perla, MD;  Location: [facility] ENDOSCOPY;  Service: Cardiovascular;  Laterality: N/A; HPI: Pt is a 73 yo F admitted for witnessed out of hospital cardiac arrest, unclear etiology, with field ROSC, resultant respiratory failure and encephalopathy s/p TTM; ETT 10/22-11/1. CRRT initiated 10/30. PMH: unilateral congenital absence of kidney, Afib, HTN, CKD, CHF, anemia, mood disorder.  MRI on 11/7 revealed "Several scattered small acute infarcts in the white matter of the superior frontal lobes, left occipital lobe. No associated hemorrhage or mass effect. 2. Several small subacute or perhaps chronic infarcts in the bilateral cerebellum."  Subjective: Pt awake, alert, pleasant, participative.  SO present for evaluation Assessment / Plan / Recommendation CHL IP CLINICAL IMPRESSIONS 08/25/2019 Clinical Impression Pt presents with a predominantly oral dysphagia c/b incomplete labial closure, impaired mastication, reduced lingual strength and coordination, and premature spillage.  There was anterior spillage of thin liquid.  Pt was unable to siphon from straw.  With regular and simulated ground consistency solids there was oral residue. With pill  simulation, pt was unable to orally transit tablet in puree or with liquid wash and tablet was expectorated on both trials. Pharyngeal phase was largely functional, with swallow intiation at the vallecula.  There was trace transient penetration of the thin liquid wash used during pill simulation only, which is suspected to be 2/2 mixed consistency nature of bolus trial.  Recommend puree diet with thin liquid by cup. SLP Visit Diagnosis Dysphagia, oropharyngeal phase (R13.12) Attention and concentration deficit following -- Frontal lobe and executive function deficit following -- Impact on safety and function Mild aspiration risk   CHL IP TREATMENT RECOMMENDATION 08/25/2019 Treatment Recommendations Therapy as outlined in treatment plan below   Prognosis 08/25/2019 Prognosis for Safe Diet Advancement Good Barriers to Reach Goals Cognitive deficits Barriers/Prognosis Comment -- CHL IP DIET RECOMMENDATION 08/25/2019 SLP Diet Recommendations Dysphagia 1 (Puree) solids;Thin liquid Liquid Administration via Cup Medication Administration Crushed with puree  Compensations Minimize environmental distractions;Slow rate;Small sips/bites Postural Changes Seated upright at 90 degrees   CHL IP OTHER RECOMMENDATIONS 08/25/2019 Recommended Consults -- Oral Care Recommendations Oral care BID Other Recommendations --   CHL IP FOLLOW UP RECOMMENDATIONS 08/25/2019 Follow up Recommendations Skilled Nursing facility;24 hour supervision/assistance   CHL IP FREQUENCY AND DURATION 08/25/2019 Speech Therapy Frequency (ACUTE ONLY) min 2x/week Treatment Duration 2 weeks      CHL IP ORAL PHASE 08/25/2019 Oral Phase Impaired Oral - Pudding Teaspoon -- Oral - Pudding Cup -- Oral - Honey Teaspoon -- Oral - Honey Cup -- Oral - Nectar Teaspoon -- Oral - Nectar Cup -- Oral - Nectar Straw -- Oral - Thin Teaspoon -- Oral - Thin Cup Left anterior bolus loss;Right anterior bolus loss;Pocketing in anterior sulcus Oral - Thin Straw -- Oral - Puree Premature  spillage;Decreased bolus cohesion Oral - Mech Soft Impaired mastication;Weak lingual manipulation;Reduced posterior propulsion;Lingual/palatal residue;Decreased bolus cohesion;Premature spillage Oral - Regular Impaired mastication;Weak lingual manipulation;Reduced posterior propulsion;Lingual/palatal residue;Decreased bolus cohesion;Premature spillage Oral - Multi-Consistency -- Oral - Pill Reduced posterior propulsion;Holding of bolus Oral Phase - Comment --  CHL IP PHARYNGEAL PHASE 08/25/2019 Pharyngeal Phase Impaired Pharyngeal- Pudding Teaspoon -- Pharyngeal -- Pharyngeal- Pudding Cup -- Pharyngeal -- Pharyngeal- Honey Teaspoon -- Pharyngeal -- Pharyngeal- Honey Cup -- Pharyngeal -- Pharyngeal- Nectar Teaspoon -- Pharyngeal -- Pharyngeal- Nectar Cup -- Pharyngeal -- Pharyngeal- Nectar Straw -- Pharyngeal -- Pharyngeal- Thin Teaspoon -- Pharyngeal -- Pharyngeal- Thin Cup Delayed swallow initiation-vallecula;Pharyngeal residue - valleculae Pharyngeal Material does not enter airway Pharyngeal- Thin Straw -- Pharyngeal -- Pharyngeal- Puree Delayed swallow initiation-vallecula Pharyngeal Material does not enter airway Pharyngeal- Mechanical Soft Delayed swallow initiation-vallecula Pharyngeal Material does not enter airway Pharyngeal- Regular Delayed swallow initiation-vallecula Pharyngeal Material does not enter airway Pharyngeal- Multi-consistency -- Pharyngeal -- Pharyngeal- Pill Delayed swallow initiation-pyriform sinuses Pharyngeal Material enters airway, remains ABOVE vocal cords then ejected out Pharyngeal Comment --  CHL IP CERVICAL ESOPHAGEAL PHASE 08/25/2019 Cervical Esophageal Phase WFL Pudding Teaspoon -- Pudding Cup -- Honey Teaspoon -- Honey Cup -- Nectar Teaspoon -- Nectar Cup -- Nectar Straw -- Thin Teaspoon -- Thin Cup -- Thin Straw -- Puree -- Mechanical Soft -- Regular -- Multi-consistency -- Pill -- Cervical Esophageal Comment -- Celedonio Savage , MA, CCC-SLP Acute Rehabilitation Services Office:  [phone] 08/25/2019, 11:38 AM               Cardiac Studies   Echo 08/21/19 IMPRESSIONS  1. Left ventricular ejection fraction, by visual estimation, is 55 to 60%. The left ventricle has normal function. There is moderately increased left ventricular hypertrophy. 2. Left ventricular diastolic parameters are indeterminate. 3. Global right ventricle has normal systolic function.The right ventricular size is normal. No increase in right ventricular wall thickness. 4. Left atrial size was moderately dilated. 5. Right atrial size was mildly dilated. 6. Trivial pericardial effusion is present. 7. The mitral valve is normal in structure. Trace mitral valve regurgitation. 8. The tricuspid valve is normal in structure. Tricuspid valve regurgitation is mild. 9. The aortic valve is tricuspid. Aortic valve regurgitation is not visualized. 10. The pulmonic valve was not well visualized. Pulmonic valve regurgitation is not visualized. 11. The tricuspid regurgitant velocity is 2.79 m/s, and with an assumed right atrial pressure of 8 mmHg, the estimated right ventricular systolic pressure is mildly elevated at 39.1 mmHg. 12. The inferior vena cava is normal in size with <50% respiratory variability, suggesting right atrial pressure of 8 mmHg. 13. The interatrial septum was not  well visualized.  Patient Profile     73 y.o. female with a pmh of one kidney, CKD stage 5, HTN, chronic afib on Eliquis, Chronic diastolic HF who was admitted to ICU after witness cardiac arrest requiring intubation. Was found to be in afib RVR placed on amio and IV heparin. Was subsequently extubated. Found to have MSSA bacteremia and multiple acute infarcts. Cardiology previously following. Now noted to be Afib RVR and cardiology once again seeing patient.  Assessment & Plan    1. Atrial flutter vs coarse atrial fibrillation -patient with intermittent Afib RVR this admission.  -Started on an amiodarone gtt and  transitioned to po after conversion to sinus rhythm, however back in Afib with RVR 08/24/2019 and was restarted on amiodarone gtt. -HR currently controlled in the 90-low 100's -stress of MSSA bacteremia and acute on chronic combined CHF which are likely contributing to RVR episodes.   -Continue amiodarone gtt for rate/rhythm control -Heparin temporarily held due to bleeding from HD catheter site (IR to see). Plans to transition to eliquis 5mg  BID prior to discharge for stroke ppx -Could consider TEE/DCCV if Afib/flutter persists  2. Post-arrest:  -patient presented with out of hospital cardiac arrest, s/p cooling protocol. - LHC initially deferred due to AoCKD in an attempt to avoid HD.  -EF initially down to 20-25% 08/03/2019, improved to 55-60% on echo 08/21/2019.   3. Acute on chronic combined CHF: - initial echo with EF 20-25% 08/03/2019 following out of hospital cardiac arrest.  -Repeat echo with normalization of EF to 55-60% 08/21/2019.  -She was subsequently started on HD for volume management.  -Continue volume management per HD  4. Metabolic encephalopathy:  -likely multifactorial in the setting of post-arrest, MSSA bacteremia, acute CVA, and hospital delirium.  -Mental status improving. EEG negative.   -Continue management per primary team  5. MSSA bacteremia:  -white count slowly up to 18.9 today -?vegetation on TEE.  -ID following for management of ID antibiotics -Continue antibiotics per ID/primary team  4. AoCKD stage IV: - started on HD this admission s/p TDC.   - Continue management per Nephrology  5. Anemia:  -s/p 1 uPRBC this admission. - Hgb down to 7.1 from 7.6 yesterday - Continue close monitoring.     For questions or updates, please contact [place] Please consult [email or web address] for contact info under Cardiology/STEMI.      Signed, Kate Sable, MD  08/27/2019, 10:09 AM

## 2019-08-27 NOTE — Progress Notes (Addendum)
Received verbal order by Radiology Dr. Pascal Lux to hold heparin until 1700 today and resume if no bleeding from the site.  Rx made aware.  Idolina Primer, RN

## 2019-08-27 NOTE — Progress Notes (Signed)
IR.  Patient with history of ESRD on HD s/p tunneled right IJ HD catheter placement in IR 08/23/2019 by Dr. Pascal Lux.  Received call from RN stating that patient had oozing around HD catheter since last evening. Went to assess patient bedside alongside Dr. Pascal Lux.   Patient awake and alert sitting in bed. Tunneled right IJ HD catheter pressure dressings were removed, saturated in bloody fluid. Site was then prepped in normal sterile fashion. 10 cc 1% Lidocaine with Epinephrine was injected into subcutaneous tract. #1, 0-prolene mattress suture applied to site near catheter exit from skin. Site was monitored for 5-10 minutes without signs of active bleeding/oozing. Site reviewed with RN. Catheter stable and ready for use.  Please call IR with questions/concerns.   Bea Graff Louk, PA-C 08/27/2019, 10:48 AM

## 2019-08-27 NOTE — Progress Notes (Signed)
Patient has not voided since foley cath removed on 08/26/2019.  Bladder scan done and showed 23 cc.   Bleeding noted from HD cath site during the night. On call Bodenheimer, NP made aware. Order received for Thrombi pad. Pad applied to site per order but did not stop the bleeding.  A second Thrombi pad applied and gauze placed to stop the bleeding. No bleeding noted at this time.  Will continue to monitor patient.

## 2019-08-28 DIAGNOSIS — B9561 Methicillin susceptible Staphylococcus aureus infection as the cause of diseases classified elsewhere: Secondary | ICD-10-CM | POA: Diagnosis not present

## 2019-08-28 DIAGNOSIS — R7881 Bacteremia: Secondary | ICD-10-CM | POA: Diagnosis not present

## 2019-08-28 LAB — CBC
HCT: 18.2 % — ABNORMAL LOW (ref 36.0–46.0)
Hemoglobin: 5.9 g/dL — CL (ref 12.0–15.0)
MCH: 28.9 pg (ref 26.0–34.0)
MCHC: 32.4 g/dL (ref 30.0–36.0)
MCV: 89.2 fL (ref 80.0–100.0)
Platelets: 426 10*3/uL — ABNORMAL HIGH (ref 150–400)
RBC: 2.04 MIL/uL — ABNORMAL LOW (ref 3.87–5.11)
RDW: 17.2 % — ABNORMAL HIGH (ref 11.5–15.5)
WBC: 19.2 10*3/uL — ABNORMAL HIGH (ref 4.0–10.5)
nRBC: 0 % (ref 0.0–0.2)

## 2019-08-28 LAB — BASIC METABOLIC PANEL
Anion gap: 14 (ref 5–15)
BUN: 37 mg/dL — ABNORMAL HIGH (ref 8–23)
CO2: 25 mmol/L (ref 22–32)
Calcium: 9.2 mg/dL (ref 8.9–10.3)
Chloride: 94 mmol/L — ABNORMAL LOW (ref 98–111)
Creatinine, Ser: 3.11 mg/dL — ABNORMAL HIGH (ref 0.44–1.00)
GFR calc Af Amer: 17 mL/min — ABNORMAL LOW (ref >60)
GFR calc non Af Amer: 14 mL/min — ABNORMAL LOW (ref >60)
Glucose, Bld: 142 mg/dL — ABNORMAL HIGH (ref 70–99)
Potassium: 4.9 mmol/L (ref 3.5–5.1)
Sodium: 133 mmol/L — ABNORMAL LOW (ref 135–145)

## 2019-08-28 LAB — GLUCOSE, CAPILLARY
Glucose-Capillary: 128 mg/dL — ABNORMAL HIGH (ref 70–99)
Glucose-Capillary: 140 mg/dL — ABNORMAL HIGH (ref 70–99)
Glucose-Capillary: 142 mg/dL — ABNORMAL HIGH (ref 70–99)
Glucose-Capillary: 144 mg/dL — ABNORMAL HIGH (ref 70–99)
Glucose-Capillary: 147 mg/dL — ABNORMAL HIGH (ref 70–99)
Glucose-Capillary: 150 mg/dL — ABNORMAL HIGH (ref 70–99)
Glucose-Capillary: 154 mg/dL — ABNORMAL HIGH (ref 70–99)
Glucose-Capillary: 89 mg/dL (ref 70–99)
Glucose-Capillary: 99 mg/dL (ref 70–99)

## 2019-08-28 LAB — HEMOGLOBIN AND HEMATOCRIT, BLOOD
HCT: 26.4 % — ABNORMAL LOW (ref 36.0–46.0)
Hemoglobin: 8.8 g/dL — ABNORMAL LOW (ref 12.0–15.0)

## 2019-08-28 LAB — PREPARE RBC (CROSSMATCH)

## 2019-08-28 LAB — HEPARIN LEVEL (UNFRACTIONATED): Heparin Unfractionated: 0.22 IU/mL — ABNORMAL LOW (ref 0.30–0.70)

## 2019-08-28 LAB — PHOSPHORUS: Phosphorus: 4.3 mg/dL (ref 2.5–4.6)

## 2019-08-28 MED ORDER — ARIPIPRAZOLE 5 MG PO TABS
10.0000 mg | ORAL_TABLET | Freq: Two times a day (BID) | ORAL | Status: DC
  Administered 2019-08-28 – 2019-09-24 (×52): 10 mg via ORAL
  Filled 2019-08-28 (×51): qty 2

## 2019-08-28 MED ORDER — SODIUM CHLORIDE 0.9% IV SOLUTION
Freq: Once | INTRAVENOUS | Status: AC
  Administered 2019-08-28: 07:00:00 via INTRAVENOUS

## 2019-08-28 NOTE — Progress Notes (Addendum)
Progress Note  Patient Name: Jaclyn Day Date of Encounter: 08/28/2019  Primary Cardiologist: Ena Dawley, MD   Subjective   Patient is lying in bed awake and alert. She denies CP or SOB. She is asking when Konrad Dolores is coming to visit.   Inpatient Medications    Scheduled Meds: .  stroke: mapping our early stages of recovery book   Does not apply Once  . sodium chloride   Intravenous Once  . ARIPiprazole  10 mg Per Tube BID  . atorvastatin  20 mg Per NG tube Daily  . chlorhexidine  15 mL Mouth Rinse BID  . Chlorhexidine Gluconate Cloth  6 each Topical Q0600  . Chlorhexidine Gluconate Cloth  6 each Topical Q0600  . feeding supplement (NEPRO CARB STEADY)  237 mL Oral BID BM  . ferrous sulfate  300 mg Per Tube Q breakfast  . insulin aspart  0-9 Units Subcutaneous Q4H  . mouth rinse  15 mL Mouth Rinse BID  . midodrine  10 mg Per Tube TID WC  . multivitamin  1 tablet Oral QHS  . pantoprazole sodium  40 mg Per Tube Daily  . sodium chloride flush  10-40 mL Intracatheter Q12H  . Thrombi-Pad  1 each Topical Once  . valproic acid  250 mg Per Tube Q6H   Continuous Infusions: . sodium chloride 10 mL (08/20/19 2025)  . sodium chloride    . sodium chloride    . amiodarone 30 mg/hr (08/28/19 0430)  .  ceFAZolin (ANCEF) IV Stopped (08/26/19 1151)  . feeding supplement (OSMOLITE 1.5 CAL) 1,000 mL (08/28/19 0109)  . heparin 1,300 Units/hr (08/27/19 1721)   PRN Meds: sodium chloride, sodium chloride, acetaminophen (TYLENOL) oral liquid 160 mg/5 mL, alteplase, heparin, hydrALAZINE, HYDROmorphone (DILAUDID) injection, levalbuterol, lidocaine (PF), lidocaine-prilocaine, pentafluoroprop-tetrafluoroeth, sodium chloride flush   Vital Signs    Vitals:   08/28/19 0638 08/28/19 0653 08/28/19 0700 08/28/19 0741  BP:  130/69 130/69 130/79  Pulse:  99 88 92  Resp:  (!) 32 (!) 37 (!) 25  Temp:  98.4 F (36.9 C)    TempSrc: Oral     SpO2:  100% 98% 92%  Weight:      Height:        Intake/Output Summary (Last 24 hours) at 08/28/2019 0837 Last data filed at 08/28/2019 0300 Gross per 24 hour  Intake 2206.13 ml  Output -  Net 2206.13 ml   Last 3 Weights 08/28/2019 08/27/2019 08/26/2019  Weight (lbs) 168 lb 14 oz 169 lb 8.5 oz 167 lb 1.7 oz  Weight (kg) 76.6 kg 76.9 kg 75.8 kg      Telemetry    Afib, HR 80-90s, peak 120s intermittently - Personally Reviewed  ECG    No new - Personally Reviewed  Physical Exam   GEN: No acute distress.   Neck: No JVD Cardiac: RRR, no murmurs, rubs, or gallops.  Respiratory: minimal crackles right base; 2LO2 GI: Soft, nontender, non-distended  MS: Generalized edema (puffiness); No deformity. Neuro:  Nonfocal  Psych: Normal affect   Labs    High Sensitivity Troponin:   Recent Labs  Lab 08/03/19 1013 08/03/19 1147 08/03/19 1550 08/04/19 0934  TROPONINIHS 48* 715* 2,793* 3,543*      Chemistry Recent Labs  Lab 08/26/19 0404 08/27/19 0320 08/28/19 0258  NA 135 135 133*  K 5.1 4.1 4.9  CL 94* 96* 94*  CO2 26 27 25   GLUCOSE 131* 146* 142*  BUN 47* 22 37*  CREATININE 3.06*  2.11* 3.11*  CALCIUM 9.0 8.8* 9.2  GFRNONAA 15* 23* 14*  GFRAA 17* 26* 17*  ANIONGAP 15 12 14      Hematology Recent Labs  Lab 08/26/19 0404 08/27/19 0320 08/28/19 0258  WBC 16.0* 18.9* 19.2*  RBC 2.64* 2.50* 2.04*  HGB 7.6* 7.1* 5.9*  HCT 24.0* 22.9* 18.2*  MCV 90.9 91.6 89.2  MCH 28.8 28.4 28.9  MCHC 31.7 31.0 32.4  RDW 17.0* 17.2* 17.2*  PLT 445* 427* 426*    BNPNo results for input(s): BNP, PROBNP in the last 168 hours.   DDimer No results for input(s): DDIMER in the last 168 hours.   Radiology    No results found.  Cardiac Studies   Echo 08/21/19 IMPRESSIONS  1. Left ventricular ejection fraction, by visual estimation, is 55 to 60%. The left ventricle has normal function. There is moderately increased left ventricular hypertrophy. 2. Left ventricular diastolic parameters are indeterminate. 3. Global right  ventricle has normal systolic function.The right ventricular size is normal. No increase in right ventricular wall thickness. 4. Left atrial size was moderately dilated. 5. Right atrial size was mildly dilated. 6. Trivial pericardial effusion is present. 7. The mitral valve is normal in structure. Trace mitral valve regurgitation. 8. The tricuspid valve is normal in structure. Tricuspid valve regurgitation is mild. 9. The aortic valve is tricuspid. Aortic valve regurgitation is not visualized. 10. The pulmonic valve was not well visualized. Pulmonic valve regurgitation is not visualized. 11. The tricuspid regurgitant velocity is 2.79 m/s, and with an assumed right atrial pressure of 8 mmHg, the estimated right ventricular systolic pressure is mildly elevated at 39.1 mmHg. 12. The inferior vena cava is normal in size with <50% respiratory variability, suggesting right atrial pressure of 8 mmHg. 13. The interatrial septum was not well visualized.  Patient Profile     73 y.o. female with a pmh of one kidney, CKD stage 5, HTN, chronic afib on Eliquis, Chronic diastolic HF who was admitted to ICU after witness cardiac arrest requiring intubation. Was found to be in afib RVR placed on amio and IV heparin. Was subsequently extubated. Found to have MSSA bacteremia and multiple acute infarcts. Cardiology previously following. Now noted to be Afib RVR and cardiology once again seeing patient.  Assessment & Plan   Atrial flutter vs coarse atrial fibrillation -patient with intermittent Afib RVR this admission. -Started on an amiodarone gtt and transitioned to po after conversion to sinus rhythm, however back in Afib with RVR 08/24/2019 and was restarted on amiodarone gtt. -HR currently controlled in the 90-low 100's -stress ofMSSA bacteremia and acute on chronic combined CHF which are likely contributing to RVR episodes.  -Continue amiodarone gtt for rate/rhythm control -Heparin temporarily  held due to bleeding from HD catheter site (IR to see). Plans to transition to eliquis 5mg  BID prior to discharge for stroke ppx -Could consider TEE/DCCV if Afib/flutter persists  Post-arrest -patient presented with out of hospital cardiac arrest, s/p cooling protocol. -LHC initially deferred due to kidney function in an attempt to avoid HD.  -EF initially down to 20-25% 08/03/2019, improved to 55-60% on echo 08/21/2019.   Acute on chronic combined CHF: -initial echo with EF 20-25% 08/03/2019 following out of hospital cardiac arrest.  -Repeat echo with normalization of EF to 55-60% 08/21/2019. -She was subsequently started on HD for volume management. -Continue volume management per HD  Metabolic encephalopathy: -likely multifactorial in the setting of post-arrest, MSSA bacteremia, acute CVA, and hospital delirium. -Mental status improving. EEG negative.   -  Continue management per primary team  MSSA bacteremia -white count slowlyup to 18.9 today -?vegetation on TEE. -ID following for management of ID antibiotics -Continue antibiotics per ID/primary team  CKD stage IV -started on intermittent HD this admission s/p TDC.  - Continue management per Nephrology  Anemia -s/p 1 uPRBC this admission. -Hgb down to 7.1 to 5.9 >> patient will likely need second transfusion - continue to hold heparin   For questions or updates, please contact [place] Please consult [email or web address] for contact info under        Signed, Cadence Ninfa Meeker, PA-C  08/28/2019, 8:37 AM    Personally seen and examined. Agree with above.   73 year old currently in atrial fibrillation/flutter with IV amiodarone drip resting, receiving blood transfusion secondary to severe iron deficiency anemia, 5.9 hemoglobin today.  Holding anticoagulation.  Resting comfortably.  Pale appearing.  Irregularly irregular rhythm.  Continue with current plan.  IV amiodarone.  She may convert as she was  previously in sinus rhythm earlier on this admission.  No need for urgent cardioversion at this time.  Unable to anticoagulate as well.  Candee Furbish, MD

## 2019-08-28 NOTE — Care Management Important Message (Signed)
Important Message  Patient Details  Name: Jaclyn Day MRN: [id number] Date of Birth: [birth year]   Medicare Important Message Given:  Yes     Shelda Altes 08/28/2019, 2:27 PM

## 2019-08-28 NOTE — Progress Notes (Signed)
PROGRESS NOTE    Jaclyn Day  [id number] DOB: [birth year] DOA: 08/03/2019 PCP: Cyndi Bender, PA-C    Brief Narrative:  Patient is 73 year old with history of congenital unilateral kidney, stage IV chronic kidney disease with baseline creatinine about 2.2-2.8, hypertension, persistent A. fib on anticoagulation with Eliquis, chronic diastolic heart failure, history of cardioversion for A. fib [name] witnessedcardiac arrest. Patient from home. 10/22 Admitted post arrest, intubated, initiation of targeted temperature management 36 celsius.  ECHO: LVEF: 20-25%, global hypokinesis, RV midly reduced function with normal size, mild MR, normal pulmonary pressures. Lactate cleared 10/25 - staph identified in blood and vanc started. Still with poor Ur OP. On amio gtt. Off leveophed and neoOn Heparin gtt. iHD w/ 1L off 10/26- following simple commands, placed on precedex for agitation, febrile- re-cultured and femoral CVL discontinued  10/28- converted to NSR, TEE  10/30 - CRRT for volume removal Blood culture 10/22 >>MSSA 1/4 COVID 10/22 >> Negative RVP 10/22 >> Negative Urine 10/22 - E colii 10/26 BCx 2 >> neg Remains in very poor mentation S/p PRBC transfusion on 11/16    Consultants:  Nephrology, interventional radiology, neurology, palliative care  RD, speech pathology  Procedures: -placement of tunneled HD catheter, CRRT, HD  Echo 08/21/19 1. Left ventricular ejection fraction, by visual estimation, is 55 to 60%. The left ventricle has normal function. There is moderately increased left ventricular hypertrophy. 2. Left ventricular diastolic parameters are indeterminate. 3. Global right ventricle has normal systolic function.The right ventricular size is normal. No increase in right ventricular wall thickness. 4. Left atrial size was moderately dilated. 5. Right atrial size was mildly dilated. 6. Trivial pericardial effusion is  present. 7. The mitral valve is normal in structure. Trace mitral valve regurgitation. 8. The tricuspid valve is normal in structure. Tricuspid valve regurgitation is mild. 9. The aortic valve is tricuspid. Aortic valve regurgitation is not visualized. 10. The pulmonic valve was not well visualized. Pulmonic valve regurgitation is not visualized. 11. The tricuspid regurgitant velocity is 2.79 m/s, and with an assumed right atrial pressure of 8 mmHg, the estimated right ventricular systolic pressure is mildly elevated at 39.1 mmHg. 12. The inferior vena cava is normal in size with <50% respiratory variability, suggesting right atrial pressure of 8 mmHg. 13. The interatrial septum was not well visualized.  Antimicrobials: Rocephin 10/22 >> 10/25 Vanc (staph in blood ) 10/24  Cefazolin 10/25 >>  Subjective: Asking if she is going home. Tolerating her feedings. Has no sob, dizziness, or any other complaints. Getting transfusion.  Objective: Vitals:   08/28/19 0638 08/28/19 0653 08/28/19 0700 08/28/19 0741  BP:  130/69 130/69 130/79  Pulse:  99 88 92  Resp:  (!) 32 (!) 37 (!) 25  Temp:  98.4 F (36.9 C)    TempSrc: Oral     SpO2:  100% 98% 92%  Weight:      Height:        Intake/Output Summary (Last 24 hours) at 08/28/2019 [id number] Last data filed at 08/28/2019 0300 Gross per 24 hour  Intake 2206.13 ml  Output -  Net 2206.13 ml   Filed Weights   08/26/19 1149 08/27/19 0435 08/28/19 0444  Weight: 75.8 kg 76.9 kg 76.6 kg    Examination: General exam:Appears calm,NAD interactive with exam.  HEENT:EOMI, NG tube in place Respiratory system: Clear to auscultationanteriorly , no wheezing or rhonchi Cardiovascular system:S1 &S2 heard, RRR. No murmurs, rubs, gallops. Right chest:HD cath site clean Gastrointestinal system:Abdomen is nondistended, soft and  nontender.Normal bowel sounds heard. Central nervous system:awake,interactive , speech appears mildly better  understandable Extremities:Trace pedal edema bilaterally, UE x2 gen. edema Skin:Warm and dry Psychiatry:Mood &affectappearsappropriate   Data Reviewed: I have personally reviewed following labs and imaging studies  CBC: Recent Labs  Lab 08/24/19 0413 08/25/19 0017 08/26/19 0404 08/27/19 0320 08/28/19 0258  WBC 15.4* 15.8* 16.0* 18.9* 19.2*  HGB 8.2* 8.0* 7.6* 7.1* 5.9*  HCT 25.8* 25.9* 24.0* 22.9* 18.2*  MCV 89.6 91.2 90.9 91.6 89.2  PLT 410* 453* 445* 427* [id number]*   Basic Metabolic Panel: Recent Labs  Lab 08/23/19 1154 08/24/19 0413 08/24/19 1119 08/25/19 0017 08/26/19 0404 08/27/19 0320 08/28/19 0258  NA 135  --  130* 135 135 135 133*  K 4.6  --  5.3* 4.2 5.1 4.1 4.9  CL 95*  --  91* 96* 94* 96* 94*  CO2 28  --  24 25 26 27 25   GLUCOSE 148*  --  157* 133* 131* 146* 142*  BUN 30*  --  43* 21 47* 22 37*  CREATININE 2.30*  --  3.13* 1.78* 3.06* 2.11* 3.11*  CALCIUM 8.9  --  9.0 8.6* 9.0 8.8* 9.2  MG 2.0  --   --   --   --   --   --   PHOS  --  4.2  --  2.8 4.2 3.1 4.3   GFR: Estimated Creatinine Clearance: 17.1 mL/min (A) (by C-G formula based on SCr of 3.11 mg/dL (H)). Liver Function Tests: No results for input(s): AST, ALT, ALKPHOS, BILITOT, PROT, ALBUMIN in the last 168 hours. No results for input(s): LIPASE, AMYLASE in the last 168 hours. No results for input(s): AMMONIA in the last 168 hours. Coagulation Profile: No results for input(s): INR, PROTIME in the last 168 hours. Cardiac Enzymes: No results for input(s): CKTOTAL, CKMB, CKMBINDEX, TROPONINI in the last 168 hours. BNP (last 3 results) No results for input(s): PROBNP in the last 8760 hours. HbA1C: No results for input(s): HGBA1C in the last 72 hours. CBG: Recent Labs  Lab 08/27/19 0756 08/27/19 1147 08/28/19 0007 08/28/19 0431 08/28/19 0800  GLUCAP 141* 182* 99 142* 144*   Lipid Profile: No results for input(s): CHOL, HDL, LDLCALC, TRIG, CHOLHDL, LDLDIRECT in the last 72 hours.  Thyroid Function Tests: No results for input(s): TSH, T4TOTAL, FREET4, T3FREE, THYROIDAB in the last 72 hours. Anemia Panel: No results for input(s): VITAMINB12, FOLATE, FERRITIN, TIBC, IRON, RETICCTPCT in the last 72 hours. Sepsis Labs: No results for input(s): PROCALCITON, LATICACIDVEN in the last 168 hours.  No results found for this or any previous visit (from the past 240 hour(s)).       Radiology Studies: No results found.      Scheduled Meds: .  stroke: mapping our early stages of recovery book   Does not apply Once  . sodium chloride   Intravenous Once  . ARIPiprazole  10 mg Per Tube BID  . atorvastatin  20 mg Per NG tube Daily  . chlorhexidine  15 mL Mouth Rinse BID  . Chlorhexidine Gluconate Cloth  6 each Topical Q0600  . Chlorhexidine Gluconate Cloth  6 each Topical Q0600  . feeding supplement (NEPRO CARB STEADY)  237 mL Oral BID BM  . ferrous sulfate  300 mg Per Tube Q breakfast  . insulin aspart  0-9 Units Subcutaneous Q4H  . mouth rinse  15 mL Mouth Rinse BID  . midodrine  10 mg Per Tube TID WC  . multivitamin  1 tablet  Oral QHS  . pantoprazole sodium  40 mg Per Tube Daily  . sodium chloride flush  10-40 mL Intracatheter Q12H  . Thrombi-Pad  1 each Topical Once  . valproic acid  250 mg Per Tube Q6H   Continuous Infusions: . sodium chloride 10 mL (08/20/19 2025)  . sodium chloride    . sodium chloride    . amiodarone 30 mg/hr (08/28/19 0430)  .  ceFAZolin (ANCEF) IV Stopped (08/26/19 1151)  . feeding supplement (OSMOLITE 1.5 CAL) 1,000 mL (08/28/19 0109)  . heparin 1,300 Units/hr (08/27/19 1721)    Assessment & Plan:   Principal Problem:   MSSA bacteremia Active Problems:   Cardiac arrest ([place])   Pressure injury of skin   Acute respiratory failure with hypoxia (HCC)   AKI (acute kidney injury) (HCC)   Elevated troponin   Persistent atrial fibrillation (HCC)   Acute on chronic combined systolic and diastolic CHF (congestive heart failure) (HCC)    Acute bacterial endocarditis   Malnutrition of moderate degree   Advanced care planning/counseling discussion   Goals of care, counseling/discussion   Palliative care by specialist   Closed fracture of multiple ribs   Cerebral embolism with cerebral infarction   Acute hypoxic respiratory failure due to cardiac arrest and pulmonary edema/bilateral pleural effusions:  Presented after out of hospital cardiac arrest. -Wasintubated ands/pextubated,on nasal cannula O2 -Improved, nephrology mx vol. Status via HD -Continue chest physiotherapy and incentive spirometry if patient can tolerate.      Improvingacute metabolic encephalopathy suspect multifactorial with recent multiple acute CVA:In the setting of cardiac arrest hypothermia, MSSA bacteremia, prolonged hospitalization, versus others. CT head 08/16/2019 stable. On 08/22/2019 mental status was improved, continues to be improving today. On valproic acid as longstanding medications.  EEG with no evidence of seizure activity. Palliative care team following. MRI done on 08/19/2019 showed several scattered small acute infarcts in the white matter of the superior frontal lobes, left occipital lobe. No associated hemorrhage or mass-effect. Also several small subacute or perhaps chronic infarcts in the bilateral cerebellum. Neurologyon board. -TTE-no evidence of vegetation or thrombus. -CT head without contrast on 08/21/2019 no sign of intracranial acute hemorrhage or acute ischemia. Appears to be still improving   HD cath site bleed (on 11/15) IR saw pt-->s/p 10 cc 1% Lidocaine with Epinephrine was injected into subcutaneous tract. s/p #1, 0-prolene mattress suture applied to site near catheter  Now stable     Multiple acute/subacute CVA involving white matter of the superior frontal lobes and left occipital lobe No hemorrhage, on heparin drip, neurology following. Neuro had okayed for anticoagulation to be  started previously   Witnessed cardiac arrest in the setting of history of chronic atrial fibrillation and coronary artery disease: Patient treated with hypothermia protocol. -LHC initially deferred due to AoCKD in an attempt to avoid HD.  EF initially down to 20-25% 08/03/2019, improved to 55-60% on echo 08/21/2019.  -cardsplans todiscuss ischemic evaluation with Dr. Meda Coffee now that patient is on HD-final decision still pending   Afib- with intermittent rvr Was SR, went back to afib rvr, cards was reconsulted.  Rateis better controlled today.  Cardiology following. Recommend continuing amiodarone drip. Onheparin drip ,. Held due to bleeding from HD site, with plans to transition to Eliquis 5 mg twice daily prior to discharge for stroke prevention.  Could consider TEE/DCCV if A. fib flutter persist.? Had TEE/DCCV earlier this year.    Acute on chronic systolic heart failure: TTE on 10/22 with EF 20%.  Repeat  TEE with improved ejection fraction to 60%.  CAD evaluation pending,initially held due to renal issues now on hemodialysis cards following Appears to be more compensated    MSSA bacteremia Repeat cultures negative.  Seen by infectious diseaserecommended cefazolin until 12/7. TTEno evidence of endocarditis or thrombus.  Acute blood loss anemia No sign of overt bleeding before, but  bleed from HD cath site 11/15 S/pTransfused1 unit PRBC during this hospitalization Hg 5/9 this am...>receiving 1 unit transfusion now. Will ck h/h post transfusion   AKI on CKD stage IV: Suspected AKI likely ATN in the setting of shock ,cardiac arrest and bacteremia.  Started on CRRTinitially.10/30-11/1 -Now on hemodialysis.  -Nephrology following. -Kidney ultrasound showed solitary right kidney with cortical thinning and cysts. -Status post tunneled hemodialysis catheter placed -Foley or still keep it for I's and O's Per nephrology, evalu. For possible AVF  or AVG, too debilitated currently -foley d/c'd on 11/14  Hyperkalemia Resolved Mx by nephrology with HD   Nutrition on tube feeding via core track tubecurrently. Speech therapyrec.Dysphagia 1 (Puree) solids; thin liquid- tolerating so far. Needs full assist for feeding;seated upright at 90 degrees. RD rec.Nepro Shake poBID Renal MVI daily D/c prostat Transition to nocturnal feedings:Osmolite 1.5@60ml /hr via cortrak over 12 hour period Continue aspiration precautions    Physical debility/ambulatory dysfunction PT/OT-recommended SNF CSW-LTACH placement.  DVT prophylaxis:Heparin drip, scd Code Status:Full code Family Communication:No one at bedside Disposition Plan:Plan for LTAC     LOS: 25 days   Time spent: 45 minutes with more than 50% coc    Nolberto Hanlon, MD [facility] Pager [phone]  If 7PM-7AM, please contact night-coverage [email or web address] Password [email or web address] 08/28/2019, 8:08 AM

## 2019-08-28 NOTE — Progress Notes (Signed)
CRITICAL VALUE ALERT  Critical Value:  Hgb 5.9  Date & Time Notied:  08/28/2019 0344  Provider Notified: Silas [place]   Orders Received/Actions taken:

## 2019-08-28 NOTE — Progress Notes (Signed)
[place] for Heparin  Indication: atrial fibrillation  Allergies  Allergen Reactions  . Codeine Other (See Comments)    Reaction not recalled by family- was told to never take this    Patient Measurements: Height: 5\' 6"  (167.6 cm) Weight: 168 lb 14 oz (76.6 kg) IBW/kg (Calculated) : 59.3 Heparin Dosing Weight: 78 kg  Vital Signs: Temp: 98.1 F (36.7 C) (11/16 1030) Temp Source: Oral (11/16 1030) BP: 131/79 (11/16 1030) Pulse Rate: 92 (11/16 1030)  Labs: Recent Labs    08/26/19 0404 08/26/19 1419 08/27/19 0320 08/28/19 0258  HGB 7.6*  --  7.1* 5.9*  HCT 24.0*  --  22.9* 18.2*  PLT 445*  --  427* 426*  HEPARINUNFRC 0.23* 0.31 0.23* 0.22*  CREATININE 3.06*  --  2.11* 3.11*    Estimated Creatinine Clearance: 17.1 mL/min (A) (by C-G formula based on SCr of 3.11 mg/dL (H)).  Assessment: Pt is a 73 yr old female admitted S/P out of hospital cardiac arrest. ROSC was achieved and targeted temperature management was initiated. This admission has been complicated by continued encephalopathy, AKI on CKD which has advanced to dialysis, and hypotension.    MRI on 11/7 found several small acute infarcts (possibly embolic in nature), though no hemorrhage. Pt continues on heparin for Afib. Plans to change back to apixaban when taking po well -Hg= 5.9 (down from 7.1) -Noted cardiology plan to hold heparin     Goal of Therapy:  Heparin level 0.3-0.5 units/ml Monitor platelets by anticoagulation protocol: Yes   Plan:  -Will hold heparin and follow CBC trend  Hildred Laser, PharmD Clinical Pharmacist **Pharmacist phone directory can now be found on [email or web address] (PW [email or web address]).  Listed under [place].

## 2019-08-28 NOTE — Progress Notes (Signed)
ANTICOAGULATION CONSULT NOTE - Follow Up Consult  Pharmacy Consult for heparin Indication: atrial fibrillation  Labs: Recent Labs    08/26/19 0404 08/26/19 1419 08/27/19 0320 08/28/19 0258  HGB 7.6*  --  7.1* 5.9*  HCT 24.0*  --  22.9* 18.2*  PLT 445*  --  427* 426*  HEPARINUNFRC 0.23* 0.31 0.23* 0.22*  CREATININE 3.06*  --  2.11* 3.11*    Assessment/Plan:  73yo female subtherapeutic on heparin after resumed; night RN reports that there has been no bleeding since resolved yesterday and heparin resumed though Hgb critically low requiring transfusion. Will continue gtt at current rate for now and check additional level.   Wynona Neat, PharmD, BCPS  08/28/2019,4:23 AM

## 2019-08-28 NOTE — Progress Notes (Addendum)
[facility] NEPHROLOGY PROGRESS NOTE  Assessment/ Plan: Pt is a 73 y.o. yo female with out of hospital cardiac arrest CPR for 10 minutes, history of CKD stage IV, CHF.  She was a started dialysis on 10/25 via right IJ catheter.  Urine culture with E. coli, CRRT initiated on 10/30/120.  #dialysis dep't AoCKD4: CKD due to hypertension.  AKI likely ATN in the setting of shock, cardiac arrest and bacteremia.  She was on CRRT since 10/30-11/1.   -Kidney ultrasound showed solitary right kidney with cortical thinning and cysts. -Seen by palliative, full scope of care, potentially to [facility] - s/p TDC with IR 11/11 - Cont iHD THS Schedule: 2-3L, 3.5h, no hep, 2K bath, TDC.  Next HD 11/17  - cont to eval for possible AVF or AVG, too debilitated currently  #Hyperkalemia: Cont HD, resolved  #Acute encephalopathy: somewhat improved but still present.  11/7 MRI with scattered small acute infarcts and chronic infarcts noted.  #Cardiac arrest, acute on chronic systolic CHF: EF [id number] by echo, cardiology is following.  EF improved in TEE.  #MSSA bacteremia: Continue cefazolin. End date six weeks from last negative culture 08/07/19 - 09/18/19.  #Hypotension: Stable.  Continue midodrine.  #Acute respiratory failure with hypoxia: HD today with UF.  On 2 L oxygen.  #Paroxysmal atrial fibrillation: Cardiology following.  On amiodarone and heparin drip.  #Anemia: hgb 5.9 today, got pRBCs today.  Subjective:  For HD tomorrow.  Caregiver at bedside, concerned about leaving the hospital.  Hgb 5.9 this AM.  Objective Vital signs in last 24 hours: Vitals:   08/28/19 0741 08/28/19 1030 08/28/19 1100 08/28/19 1126  BP: 130/79 131/79 139/78   Pulse: 92 92    Resp: (!) 25 (!) 22    Temp:  98.1 F (36.7 C)  98.4 F (36.9 C)  TempSrc:  Oral  Oral  SpO2: 92% 100%    Weight:      Height:       Weight change: -1.3 kg  Intake/Output Summary (Last 24 hours) at 08/28/2019 1441 Last data filed at  08/28/2019 1030 Gross per 24 hour  Intake 2512.15 ml  Output -  Net 2512.15 ml       Labs: Basic Metabolic Panel: Recent Labs  Lab 08/26/19 0404 08/27/19 0320 08/28/19 0258  NA 135 135 133*  K 5.1 4.1 4.9  CL 94* 96* 94*  CO2 26 27 25   GLUCOSE 131* 146* 142*  BUN 47* 22 37*  CREATININE 3.06* 2.11* 3.11*  CALCIUM 9.0 8.8* 9.2  PHOS 4.2 3.1 4.3   Liver Function Tests: No results for input(s): AST, ALT, ALKPHOS, BILITOT, PROT, ALBUMIN in the last 168 hours. No results for input(s): LIPASE, AMYLASE in the last 168 hours. No results for input(s): AMMONIA in the last 168 hours. CBC: Recent Labs  Lab 08/24/19 0413 08/25/19 0017 08/26/19 0404 08/27/19 0320 08/28/19 0258 08/28/19 1400  WBC 15.4* 15.8* 16.0* 18.9* 19.2*  --   HGB 8.2* 8.0* 7.6* 7.1* 5.9* 8.8*  HCT 25.8* 25.9* 24.0* 22.9* 18.2* 26.4*  MCV 89.6 91.2 90.9 91.6 89.2  --   PLT 410* 453* 445* 427* 426*  --    Cardiac Enzymes: No results for input(s): CKTOTAL, CKMB, CKMBINDEX, TROPONINI in the last 168 hours. CBG: Recent Labs  Lab 08/27/19 0756 08/27/19 1147 08/28/19 0007 08/28/19 0431 08/28/19 0800  GLUCAP 141* 182* 99 142* 144*    Iron Studies:  No results for input(s): IRON, TIBC, TRANSFERRIN, FERRITIN in the last 72  hours. Studies/Results: No results found.  Medications: Infusions: . sodium chloride 10 mL (08/20/19 2025)  . sodium chloride    . sodium chloride    . amiodarone 30 mg/hr (08/28/19 0430)  .  ceFAZolin (ANCEF) IV Stopped (08/26/19 1151)  . feeding supplement (OSMOLITE 1.5 CAL) 1,000 mL (08/28/19 0109)    Scheduled Medications: .  stroke: mapping our early stages of recovery book   Does not apply Once  . sodium chloride   Intravenous Once  . ARIPiprazole  10 mg Per Tube BID  . atorvastatin  20 mg Per NG tube Daily  . chlorhexidine  15 mL Mouth Rinse BID  . Chlorhexidine Gluconate Cloth  6 each Topical Q0600  . Chlorhexidine Gluconate Cloth  6 each Topical Q0600  .  feeding supplement (NEPRO CARB STEADY)  237 mL Oral BID BM  . ferrous sulfate  300 mg Per Tube Q breakfast  . insulin aspart  0-9 Units Subcutaneous Q4H  . mouth rinse  15 mL Mouth Rinse BID  . midodrine  10 mg Per Tube TID WC  . multivitamin  1 tablet Oral QHS  . pantoprazole sodium  40 mg Per Tube Daily  . sodium chloride flush  10-40 mL Intracatheter Q12H  . Thrombi-Pad  1 each Topical Once  . valproic acid  250 mg Per Tube Q6H    have reviewed scheduled and prn medications.  Physical Exam: General: Alert awake but remains encephalopathic HEENT: Coretrak in place Heart:RRR, s1s2 nl, no rubs Lungs: Bibasal crackles, no increased work of breathing Abdomen:soft, Non-tender, non-distended Extremities: Trace edema, LUE edema 2+ Dialysis Access: TDC c/d/i  Jarred Purtee 08/28/2019,2:41 PM  LOS: 25 days

## 2019-08-28 NOTE — Progress Notes (Signed)
Calorie Count Note  48 hour calorie count ordered. Day 1 and 2 results below.  Diet: Dysphagia 1 diet Supplements:  -Nepro Shake po BID, each supplement provides 425 kcal and 19 grams protein  No intakes documented for 11/13.  11/14: Breakfast: no documentation Lunch: 0% Dinner: 0% Supplements: 425 kcal, 19g protein (1 Nepro)  Total intake: 425 kcal (24% of minimum estimated needs)  19g protein (21% of minimum estimated needs)  11/15: Breakfast: 0% Lunch: 210 kcals, 10g protein Dinner: 180 kcals, 6g protein Supplements: 425 kcal, 19g protein (1 Nepro)  Total intake: 815 kcal (46% of minimum estimated needs)  -improving 35g protein (39% of minimum estimated needs)  Nutrition Dx: Moderate Malnutrition related to acute illness(acute respiratory failure, AKI required CRRT, endocarditis) as evidenced by mild fat depletion, mild muscle depletion, moderate muscle depletion.  Goal: Pt to meet >/= 90% of their estimated nutrition needs  -meeting with PO intake + nocturnal TF  Intervention:  -Continue nocturnal feeds of Osmolite 1.5 @ 60 ml/hr via cortrak over 12 hour period (provides 990 kcal, 41g protein) -Continue Nepro Shake po BID, each supplement provides 425 kcal and 19 grams protein  Jaclyn Bibles, MS, RD, LDN Inpatient Clinical Dietitian Pager: [phone] After Hours Pager: [phone]

## 2019-08-28 NOTE — Progress Notes (Signed)
PT Cancellation Note  Patient Details Name: Jaclyn Day MRN: [id number] DOB: 1946/05/05   Cancelled Treatment:    Reason Eval/Treat Not Completed: Medical issues which prohibited therapy per chart review Hgb 5.9, HCT 18.2- both well outside of [facility] recommended guidelines for safe participation in PT. Holding today, will follow acutely. If patient has urgent rehab needs, please direct message this therapist in [place] (8am-4pm) or call acute rehab office at number below.     Windell Norfolk, DPT, CBIS  Supplemental Physical Therapist [facility]    Pager [phone] Acute Rehab Office [phone]

## 2019-08-28 NOTE — TOC Progression Note (Signed)
Transition of Care [facility]) - Progression Note    Patient Details  Name: Jaclyn Day MRN: [id number] Date of Birth: 1945/10/28  Transition of Care [facility]) CM/SW Contact  Graves-Bigelow, Ocie Cornfield, RN Phone Number: 08/28/2019, 1:24 PM  Clinical Narrative:  CM continues on IV Amio gtt, IV Ancef and Cortrak. CM spoke with [name] regarding LTAC- he wants the Admissions Coordinator to speak with the patient's sister. CM explained to [facility] that there is a small window of opportunity for LTAC and would hate to miss this chance if insurance approves. CM will continue to follow for transition of care needs.     Expected Discharge Plan: Long Term Acute Care (LTAC) Barriers to Discharge: Continued Medical Work up  Expected Discharge Plan and Services Expected Discharge Plan: [place] (LTAC) In-house Referral: NA Discharge Planning Services: CM Consult Post Acute Care Choice: [place] arrangements for the past 2 months: Single Family Home                   Readmission Risk Interventions Readmission Risk Prevention Plan 08/08/2019  Transportation Screening Complete  PCP or Specialist Appt within 3-5 Days Complete  HRI or Home Care Consult Complete  Medication Review (RN Care Manager) Complete  Some recent data might be hidden
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
## 2019-08-29 DIAGNOSIS — R7881 Bacteremia: Secondary | ICD-10-CM | POA: Diagnosis not present

## 2019-08-29 DIAGNOSIS — B9561 Methicillin susceptible Staphylococcus aureus infection as the cause of diseases classified elsewhere: Secondary | ICD-10-CM | POA: Diagnosis not present

## 2019-08-29 LAB — GLUCOSE, CAPILLARY
Glucose-Capillary: 105 mg/dL — ABNORMAL HIGH (ref 70–99)
Glucose-Capillary: 107 mg/dL — ABNORMAL HIGH (ref 70–99)
Glucose-Capillary: 115 mg/dL — ABNORMAL HIGH (ref 70–99)
Glucose-Capillary: 123 mg/dL — ABNORMAL HIGH (ref 70–99)
Glucose-Capillary: 130 mg/dL — ABNORMAL HIGH (ref 70–99)
Glucose-Capillary: 132 mg/dL — ABNORMAL HIGH (ref 70–99)
Glucose-Capillary: 132 mg/dL — ABNORMAL HIGH (ref 70–99)
Glucose-Capillary: 137 mg/dL — ABNORMAL HIGH (ref 70–99)
Glucose-Capillary: 137 mg/dL — ABNORMAL HIGH (ref 70–99)
Glucose-Capillary: 145 mg/dL — ABNORMAL HIGH (ref 70–99)
Glucose-Capillary: 146 mg/dL — ABNORMAL HIGH (ref 70–99)
Glucose-Capillary: 157 mg/dL — ABNORMAL HIGH (ref 70–99)
Glucose-Capillary: 160 mg/dL — ABNORMAL HIGH (ref 70–99)
Glucose-Capillary: 162 mg/dL — ABNORMAL HIGH (ref 70–99)

## 2019-08-29 LAB — CBC
HCT: 24.3 % — ABNORMAL LOW (ref 36.0–46.0)
Hemoglobin: 8.2 g/dL — ABNORMAL LOW (ref 12.0–15.0)
MCH: 28.7 pg (ref 26.0–34.0)
MCHC: 33.7 g/dL (ref 30.0–36.0)
MCV: 85 fL (ref 80.0–100.0)
Platelets: 381 10*3/uL (ref 150–400)
RBC: 2.86 MIL/uL — ABNORMAL LOW (ref 3.87–5.11)
RDW: 17.3 % — ABNORMAL HIGH (ref 11.5–15.5)
WBC: 17.7 10*3/uL — ABNORMAL HIGH (ref 4.0–10.5)
nRBC: 0 % (ref 0.0–0.2)

## 2019-08-29 LAB — BASIC METABOLIC PANEL
Anion gap: 17 — ABNORMAL HIGH (ref 5–15)
BUN: 55 mg/dL — ABNORMAL HIGH (ref 8–23)
CO2: 25 mmol/L (ref 22–32)
Calcium: 9.2 mg/dL (ref 8.9–10.3)
Chloride: 93 mmol/L — ABNORMAL LOW (ref 98–111)
Creatinine, Ser: 4 mg/dL — ABNORMAL HIGH (ref 0.44–1.00)
GFR calc Af Amer: 12 mL/min — ABNORMAL LOW (ref >60)
GFR calc non Af Amer: 11 mL/min — ABNORMAL LOW (ref >60)
Glucose, Bld: 142 mg/dL — ABNORMAL HIGH (ref 70–99)
Potassium: 5.5 mmol/L — ABNORMAL HIGH (ref 3.5–5.1)
Sodium: 135 mmol/L (ref 135–145)

## 2019-08-29 LAB — TYPE AND SCREEN
ABO/RH(D): O POS
Antibody Screen: NEGATIVE
Unit division: 0

## 2019-08-29 LAB — BPAM RBC
Blood Product Expiration Date: 202011222359
ISSUE DATE / TIME: 202011160630
Unit Type and Rh: 5100

## 2019-08-29 LAB — PHOSPHORUS: Phosphorus: 4 mg/dL (ref 2.5–4.6)

## 2019-08-29 MED ORDER — MIDODRINE HCL 5 MG PO TABS
ORAL_TABLET | ORAL | Status: AC
  Filled 2019-08-29: qty 2

## 2019-08-29 NOTE — Plan of Care (Signed)
  Problem: Education: Goal: Knowledge of General Education information will improve Description: Including pain rating scale, medication(s)/side effects and non-pharmacologic comfort measures Outcome: Progressing   Problem: Clinical Measurements: Goal: Ability to maintain clinical measurements within normal limits will improve Outcome: Progressing Goal: Will remain free from infection Outcome: Progressing Goal: Diagnostic test results will improve Outcome: Progressing   Problem: Activity: Goal: Risk for activity intolerance will decrease Outcome: Progressing   Problem: Coping: Goal: Level of anxiety will decrease Outcome: Progressing   Problem: Pain Managment: Goal: General experience of comfort will improve Outcome: Progressing   Problem: Safety: Goal: Ability to remain free from injury will improve Outcome: Progressing  Elesa Hacker, RN

## 2019-08-29 NOTE — Progress Notes (Signed)
Nutrition Follow-up  DOCUMENTATION CODES:   Non-severe (moderate) malnutrition in context of acute illness/injury  INTERVENTION:   -Nepro Shake po BID, each supplement provides 425 kcal and 19 grams protein -Renal MVI daily  Nocturnal feedings:  -Osmolite 1.5 @ 55 ml/hr via cortrak over 12 hour period (2000-0800) Tube feeding regimen provides 990 kcal (56% of needs), 41 grams of protein, and 502 ml of H2O.   Pt able to meet >90% of nutritional needs by PO intake as long as she consumes protein supplements.  NUTRITION DIAGNOSIS:   Moderate Malnutrition related to acute illness(acute respiratory failure, AKI required CRRT, endocarditis) as evidenced by mild fat depletion, mild muscle depletion, moderate muscle depletion.  Ongoing.  GOAL:   Patient will meet greater than or equal to 90% of their needs  Meeting with PO intake +TF  MONITOR:   TF tolerance, Diet advancement, Labs, Weight trends, Skin  ASSESSMENT:   73 year old female who presented to the ED on 10/22 after cardiac arrest. PMH anemia, CHF, CKD stage IV, HTN, IDDM. Pt required intubation in the ED. TTM initiated.  10/22 -Admit, Intubated, normothermia TTM 10/24 - rewarmed, TF initiated 10/25 - HD catheter placed, first HD 10/27 - HD 10/28 - s/p TEE suspicious for vegetation 10/30 - CRRT initiated 11/01 - CRRT discontinued, extubated 11/03 -Cortrak tube placed, TF initiated 11/11- s/p HD cath placement 11/13- s/p MBSS- advanced to dysphagia 1 diet with thin liquids  **RD working remotely**  Per RN 11/16, pt did not consume much breakfast yesterday. Pt was being fed by significant other and pt ate better. He was able to encourage pt to eat some snacks such as applesauce and pudding.  Calorie Count results 11/16: B: 180 kcals, 3g protein L: 450 kcals, 23g protein D: 410 kcals, 17g protein Supplements: 850 kcals, 38g protein  Total: 1890 kcals (100% of needs), 81g protein (91% of needs).  Last HD  11/14. Next HD will be today.  Admission weight: 192 lbs. Current weight: 171 lbs. I/Os: -2.3L since 11/3  Medications: Ferrous sulfate, Rena-Vit Labs reviewed: CBGs: 123-157 Elevated K  Diet Order:   Diet Order            DIET - DYS 1 Room service appropriate? Yes; Fluid consistency: Thin  Diet effective now              EDUCATION NEEDS:   Not appropriate for education at this time  Skin:  Skin Assessment: Reviewed RN Assessment Skin Integrity Issues:: DTI DTI: -  Last BM:  11/16  Height:   Ht Readings from Last 1 Encounters:  08/03/19 5\' 6"  (1.676 m)    Weight:   Wt Readings from Last 1 Encounters:  08/29/19 77.7 kg    Ideal Body Weight:  59.1 kg  BMI:  Body mass index is 27.65 kg/m.  Estimated Nutritional Needs:   Kcal:  [id number]  Protein:  89-105g  Fluid:  >/= 1.5 L  Clayton Bibles, MS, RD, LDN Inpatient Clinical Dietitian Pager: [phone] After Hours Pager: [phone]
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
## 2019-08-29 NOTE — Progress Notes (Signed)
SLP Cancellation Note  Patient Details Name: Jaclyn Day MRN: [id number] DOB: [birth year]   Cancelled treatment:       Reason Eval/Treat Not Completed: Patient at procedure or test/unavailable   [name] 08/29/2019, 4:22 PM  [name], M.A. [place] Acute Environmental education officer [phone] Office [phone]

## 2019-08-29 NOTE — Progress Notes (Signed)
[facility] Progress Note    Assessment/ Plan:   #dialysis dep't AoCKD4: CKD due to hypertension.  AKI likely ATN in the setting of shock, cardiac arrest and bacteremia.  She was on CRRT 10/30-11/1.   -Kidney ultrasound showed solitary right kidney with cortical thinning and cysts. -Seen by palliative, full scope of care, potentially to LTACH?  - s/p TDC with IR 11/11 - Cont iHD THS Schedule: 2-3L, 3.5h, no hep, 2K bath, TDC.  Next HD 11/17  - cont to eval for possible AVF or AVG, too debilitated currently  #Hyperkalemia: Cont HD  #Acute encephalopathy: somewhat improved but still present.  11/7 MRI with scattered small acute infarcts and chronic infarcts noted.  #Cardiac arrest, acute on chronic systolic CHF: EF [id number] by echo, cardiology is following.  EF improved in TEE.  #MSSA bacteremia: Continue cefazolin. End date six weeks from last negative culture 08/07/19 - 09/18/19.  #Hypotension: Stable.  Continue midodrine.  #Acute respiratory failure with hypoxia: HD today with UF.  On 2 L oxygen.  #Paroxysmal atrial fibrillation: Cardiology following.  On amiodarone and heparin drip.  #Anemia: hgb 5.9 11/15 in setting of TDC ooze- IR has seen and stitched, Got 1 u PRBCs, better now, Hgb up to 8.2  Subjective:     Seen in room.  For HD today.  Pt's SO very upset today, saying he will take her home because "that's what she wants to do".     Objective:   BP (!) 117/59   Pulse 97   Temp 98.4 F (36.9 C) (Axillary)   Resp (!) 33   Ht 5\' 6"  (1.676 m)   Wt 77.7 kg   SpO2 98%   BMI 27.65 kg/m   Intake/Output Summary (Last 24 hours) at 08/29/2019 1346 Last data filed at 08/28/2019 1856 Gross per 24 hour  Intake 120 ml  Output -  Net 120 ml   Weight change: 1.1 kg  Physical Exam: General: Alert awake, tearful HEENT: [place] in place Heart:RRR, s1s2 nl, no rubs Lungs: on O2, clear anteriorly Abdomen:soft, Non-tender, non-distended Extremities:  Trace edema, LUE edema 2+ Dialysis Access: [facility] c/d/i  Imaging: No results found.  Labs: BMET Recent Labs  Lab 08/23/19 0500 08/23/19 1154 08/24/19 0413 08/24/19 1119 08/25/19 0017 08/26/19 0404 08/27/19 0320 08/28/19 0258 08/29/19 0312  NA  --  135  --  130* 135 135 135 133* 135  K  --  4.6  --  5.3* 4.2 5.1 4.1 4.9 5.5*  CL  --  95*  --  91* 96* 94* 96* 94* 93*  CO2  --  28  --  24 25 26 27 25 25   GLUCOSE  --  148*  --  157* 133* 131* 146* 142* 142*  BUN  --  30*  --  43* 21 47* 22 37* 55*  CREATININE  --  2.30*  --  3.13* 1.78* 3.06* 2.11* 3.11* 4.00*  CALCIUM  --  8.9  --  9.0 8.6* 9.0 8.8* 9.2 9.2  PHOS 3.0  --  4.2  --  2.8 4.2 3.1 4.3 4.0   CBC Recent Labs  Lab 08/26/19 0404 08/27/19 0320 08/28/19 0258 08/28/19 1400 08/29/19 0312  WBC 16.0* 18.9* 19.2*  --  17.7*  HGB 7.6* 7.1* 5.9* 8.8* 8.2*  HCT 24.0* 22.9* 18.2* 26.4* 24.3*  MCV 90.9 91.6 89.2  --  85.0  PLT 445* 427* 426*  --  381    Medications:    .  stroke:  mapping our early stages of recovery book   Does not apply Once  . sodium chloride   Intravenous Once  . ARIPiprazole  10 mg Oral BID  . atorvastatin  20 mg Per NG tube Daily  . chlorhexidine  15 mL Mouth Rinse BID  . Chlorhexidine Gluconate Cloth  6 each Topical Q0600  . Chlorhexidine Gluconate Cloth  6 each Topical Q0600  . feeding supplement (NEPRO CARB STEADY)  237 mL Oral BID BM  . ferrous sulfate  300 mg Per Tube Q breakfast  . insulin aspart  0-9 Units Subcutaneous Q4H  . mouth rinse  15 mL Mouth Rinse BID  . midodrine      . midodrine  10 mg Per Tube TID WC  . multivitamin  1 tablet Oral QHS  . pantoprazole sodium  40 mg Per Tube Daily  . sodium chloride flush  10-40 mL Intracatheter Q12H  . Thrombi-Pad  1 each Topical Once  . valproic acid  250 mg Per Tube Q6H      Jaclyn Lips, MD 08/29/2019, 1:46 PM

## 2019-08-29 NOTE — Progress Notes (Signed)
Patient POA at bedside, states he is refusing for patient to go to HD today. When RN asked reasoning behind his decision he stated "because I make the orders and I'm staying up here today". RN attempted to explain the importance of HD in the patient's care, POA refused to listen and kept insisting that "this was his order". RN paged MD with information, received call back, MD spoke with POA to explain necessity of HD, POA still refusing and stating that "he makes the decisions". Verbal order from MD to write progress note about refusal. Reconfirmed with POA that HD was to be refused today, POA stated that we would continue with HD today since that was what the MD wanted. MD notified.

## 2019-08-29 NOTE — Progress Notes (Signed)
[facility] for Heparin  Indication: atrial fibrillation  Allergies  Allergen Reactions  . Codeine Other (See Comments)    Reaction not recalled by family- was told to never take this    Patient Measurements: Height: 5\' 6"  (167.6 cm) Weight: 171 lb 4.8 oz (77.7 kg) IBW/kg (Calculated) : 59.3 Heparin Dosing Weight: 78 kg  Vital Signs: Temp: 98.4 F (36.9 C) (11/17 0430) Temp Source: Axillary (11/17 0430) BP: 137/82 (11/17 0430) Pulse Rate: 89 (11/17 0430)  Labs: Recent Labs    08/26/19 1419  08/27/19 0320 08/28/19 0258 08/28/19 1400 08/29/19 0312  HGB  --    < > 7.1* 5.9* 8.8* 8.2*  HCT  --    < > 22.9* 18.2* 26.4* 24.3*  PLT  --   --  427* 426*  --  381  HEPARINUNFRC 0.31  --  0.23* 0.22*  --   --   CREATININE  --   --  2.11* 3.11*  --  4.00*   < > = values in this interval not displayed.    Estimated Creatinine Clearance: 13.4 mL/min (A) (by C-G formula based on SCr of 4 mg/dL (H)).  Assessment: Pt is a 73 yr old female admitted S/P out of hospital cardiac arrest. ROSC was achieved and targeted temperature management was initiated. This admission has been complicated by continued encephalopathy, AKI on CKD which has advanced to dialysis, and hypotension.    MRI on 11/7 found several small acute infarcts (possibly embolic in nature), though no hemorrhage. Pt continues on heparin for Afib. Plans to change back to apixaban when taking po well  Hgb down to 5.9 on 11/16 with bleeding from HD line. Heparin has been on hold. Hgb improved today.    Goal of Therapy:  Heparin level 0.3-0.5 units/ml Monitor platelets by anticoagulation protocol: Yes   Plan:  -Continue to hold heparin for now per cardiology   Arrie Senate, PharmD, BCPS Clinical Pharmacist [phone] Please check AMION for all [facility] numbers 08/29/2019

## 2019-08-29 NOTE — Progress Notes (Addendum)
Progress Note  Patient Name: Jaclyn Day Date of Encounter: 08/29/2019  Primary Cardiologist: Ena Dawley, MD   Subjective   Patient says she feels good this morning. Denies CP or worsening SOB. On 2L O2. Plan for HD today.  Inpatient Medications    Scheduled Meds: .  stroke: mapping our early stages of recovery book   Does not apply Once  . sodium chloride   Intravenous Once  . ARIPiprazole  10 mg Oral BID  . atorvastatin  20 mg Per NG tube Daily  . chlorhexidine  15 mL Mouth Rinse BID  . Chlorhexidine Gluconate Cloth  6 each Topical Q0600  . Chlorhexidine Gluconate Cloth  6 each Topical Q0600  . feeding supplement (NEPRO CARB STEADY)  237 mL Oral BID BM  . ferrous sulfate  300 mg Per Tube Q breakfast  . insulin aspart  0-9 Units Subcutaneous Q4H  . mouth rinse  15 mL Mouth Rinse BID  . midodrine  10 mg Per Tube TID WC  . multivitamin  1 tablet Oral QHS  . pantoprazole sodium  40 mg Per Tube Daily  . sodium chloride flush  10-40 mL Intracatheter Q12H  . Thrombi-Pad  1 each Topical Once  . valproic acid  250 mg Per Tube Q6H   Continuous Infusions: . sodium chloride 10 mL (08/20/19 2025)  . sodium chloride    . sodium chloride    . amiodarone 30 mg/hr (08/28/19 2035)  .  ceFAZolin (ANCEF) IV Stopped (08/26/19 1151)  . feeding supplement (OSMOLITE 1.5 CAL) 1,000 mL (08/28/19 0109)   PRN Meds: sodium chloride, sodium chloride, acetaminophen (TYLENOL) oral liquid 160 mg/5 mL, alteplase, heparin, hydrALAZINE, HYDROmorphone (DILAUDID) injection, levalbuterol, lidocaine (PF), lidocaine-prilocaine, pentafluoroprop-tetrafluoroeth, sodium chloride flush   Vital Signs    Vitals:   08/28/19 1652 08/28/19 2014 08/29/19 0013 08/29/19 0430  BP: 119/77 (!) 121/96 (!) 142/77 137/82  Pulse: (!) 101 100 99 89  Resp: (!) 23 (!) 26 (!) 30 (!) 25  Temp: 97.6 F (36.4 C) 98.9 F (37.2 C) 98.9 F (37.2 C) 98.4 F (36.9 C)  TempSrc: Axillary Axillary Axillary Axillary   SpO2: 99% 96% 95% 98%  Weight:    77.7 kg  Height:        Intake/Output Summary (Last 24 hours) at 08/29/2019 0655 Last data filed at 08/28/2019 1856 Gross per 24 hour  Intake 610 ml  Output -  Net 610 ml   Last 3 Weights 08/29/2019 08/28/2019 08/27/2019  Weight (lbs) 171 lb 4.8 oz 168 lb 14 oz 169 lb 8.5 oz  Weight (kg) 77.7 kg 76.6 kg 76.9 kg      Telemetry    Afib, HR 90-110, peak 120s - Personally Reviewed  ECG    No new - Personally Reviewed  Physical Exam   GEN: No acute distress.   Neck: + JVD Cardiac: Irreg Irreg, no murmurs, rubs, or gallops.  Respiratory: difficult to assess bilaterally. GI: Soft, nontender, non-distended  MS: generalized edema; No deformity. Neuro:  Nonfocal  Psych: Normal affect   Labs    High Sensitivity Troponin:   Recent Labs  Lab 08/03/19 1013 08/03/19 1147 08/03/19 1550 08/04/19 0934  TROPONINIHS 48* 715* 2,793* 3,543*      Chemistry Recent Labs  Lab 08/27/19 0320 08/28/19 0258 08/29/19 0312  NA 135 133* 135  K 4.1 4.9 5.5*  CL 96* 94* 93*  CO2 27 25 25   GLUCOSE 146* 142* 142*  BUN 22 37* 55*  CREATININE  2.11* 3.11* 4.00*  CALCIUM 8.8* 9.2 9.2  GFRNONAA 23* 14* 11*  GFRAA 26* 17* 12*  ANIONGAP 12 14 17*     Hematology Recent Labs  Lab 08/27/19 0320 08/28/19 0258 08/28/19 1400 08/29/19 0312  WBC 18.9* 19.2*  --  17.7*  RBC 2.50* 2.04*  --  2.86*  HGB 7.1* 5.9* 8.8* 8.2*  HCT 22.9* 18.2* 26.4* 24.3*  MCV 91.6 89.2  --  85.0  MCH 28.4 28.9  --  28.7  MCHC 31.0 32.4  --  33.7  RDW 17.2* 17.2*  --  17.3*  PLT 427* 426*  --  381    BNPNo results for input(s): BNP, PROBNP in the last 168 hours.   DDimer No results for input(s): DDIMER in the last 168 hours.   Radiology    No results found.  Cardiac Studies   Echo 08/21/19 IMPRESSIONS  1. Left ventricular ejection fraction, by visual estimation, is 55 to 60%. The left ventricle has normal function. There is moderately increased left  ventricular hypertrophy. 2. Left ventricular diastolic parameters are indeterminate. 3. Global right ventricle has normal systolic function.The right ventricular size is normal. No increase in right ventricular wall thickness. 4. Left atrial size was moderately dilated. 5. Right atrial size was mildly dilated. 6. Trivial pericardial effusion is present. 7. The mitral valve is normal in structure. Trace mitral valve regurgitation. 8. The tricuspid valve is normal in structure. Tricuspid valve regurgitation is mild. 9. The aortic valve is tricuspid. Aortic valve regurgitation is not visualized. 10. The pulmonic valve was not well visualized. Pulmonic valve regurgitation is not visualized. 11. The tricuspid regurgitant velocity is 2.79 m/s, and with an assumed right atrial pressure of 8 mmHg, the estimated right ventricular systolic pressure is mildly elevated at 39.1 mmHg. 12. The inferior vena cava is normal in size with <50% respiratory variability, suggesting right atrial pressure of 8 mmHg. 13. The interatrial septum was not well visualized.  Patient Profile     73 y.o. female with a pmh of one kidney, CKD stage 5, HTN, chronic afib on Eliquis, Chronic diastolic HF who was admitted to ICU after witness cardiac arrest requiring intubation. Was found to be in afib RVR placed on amio and IV heparin. Was subsequently extubated. Found to have MSSA bacteremia and multiple acute infarcts. Cardiology previously following. Now noted to be Afib RVR and cardiology once again seeing patient.  Assessment & Plan    Atrial flutter vs coarse atrial fibrillation -patient with intermittent Afib RVR this admission. -Started on an amiodarone gtt and transitioned to po after conversion to sinus rhythm, however back in Afib with RVR 08/24/2019 and was restarted on amiodarone gtt. -Patient is still in afib. HR currently controlled in the 90-low 100's -stress ofMSSA bacteremia and acute on chronic  combined CHF which are likely contributing to RVR episodes.  -Continue amiodarone gtt for rate/rhythm control -Heparin temporarily held due to bleeding from HD catheter site (IR to see). Hgb also has been low. Plans to transition to eliquis 5mg  BID prior to discharge for stroke ppx -Could consider TEE/DCCV if Afib/flutter persists  Post-arrest patient presented with out of hospital cardiac arrest, s/p cooling protocol.LHC initially deferred due to kidney function in an attempt to avoid HD.  -EF initially down to 20-25% 08/03/2019, improved to 55-60% on echo 08/21/2019.   Acute on chronic combined CHF: -initial echo with EF 20-25% 08/03/2019 following out of hospital cardiac arrest.  -Repeat echo with normalization of EF to 55-60% 08/21/2019. -  She was subsequently started on HD for volume management.Creatinine continues to worsen. -Continue volume management per HD  Metabolic encephalopathy: -likely multifactorial in the setting of post-arrest, MSSA bacteremia, acute CVA, and hospital delirium. -Mental status slowly improving. EEG negative.  -Continue management per primary team  MSSA bacteremia -white count slowlyup to18.9today -?vegetation on TEE. -ID following for management of ID antibiotics -Continue antibiotics per ID/primary team  CKD stage IV -started on intermittent HD this admission s/p TDC.  - creatinine continues to worsen. Hd today - Continue management per Nephrology  Anemia -s/p 2 uPRBC this admission. -Hgb down to 7.1to 5.9 >> patient received second transfusion yesterday Hgb today 8.2 - continue to hold heparin   For questions or updates, please contact [place] Please consult [email or web address] for contact info under        Signed, Cadence Ninfa Meeker, PA-C  08/29/2019, 6:55 AM    Personally seen and examined. Agree with above.   Improved post transfusion.  Hemoglobin increased. Holding IV heparin-atrial fibrillation-severe anemia  requiring transfusion.  At some point, Eliquis No need for conversion, especially if we cannot anticoagulate her  HD for volume management. Infectious disease following antibiotics.  Candee Furbish, MD

## 2019-08-29 NOTE — Progress Notes (Signed)
PROGRESS NOTE    CHEVETTE DANNELLY  [id number] DOB: [birth year] DOA: 08/03/2019 PCP: Cyndi Bender, PA-C    Brief Narrative:  Patient is 73 year old with history of congenital unilateral kidney, stage IV chronic kidney disease with baseline creatinine about 2.2-2.8, hypertension, persistent A. fib on anticoagulation with Eliquis, chronic diastolic heart failure, history of cardioversion for A. fib admitted to [facility] witnessedcardiac arrest. Patient from home. 10/22 Admitted post arrest, intubated, initiation of targeted temperature management 36 celsius.  ECHO: LVEF: 20-25%, global hypokinesis, RV midly reduced function with normal size, mild MR, normal pulmonary pressures. Lactate cleared 10/25 - staph identified in blood and vanc started. Still with poor Ur OP. On amio gtt. Off leveophed and neoOn Heparin gtt. iHD w/ 1L off 10/26- following simple commands, placed on precedex for agitation, febrile- re-cultured and femoral CVL discontinued  10/28- converted to NSR, TEE  10/30 - CRRT for volume removal Blood culture 10/22 >>MSSA 1/4 COVID 10/22 >> Negative RVP 10/22 >> Negative Urine 10/22 - E colii 10/26 BCx 2 >> neg Remains in very poor mentation S/p PRBC transfusion on 11/16    Consultants:  Nephrology, interventional radiology, neurology, palliative care  RD, speech pathology  Procedures: -placement of tunneled HD catheter, CRRT, HD  Echo 08/21/19 1. Left ventricular ejection fraction, by visual estimation, is 55 to 60%. The left ventricle has normal function. There is moderately increased left ventricular hypertrophy. 2. Left ventricular diastolic parameters are indeterminate. 3. Global right ventricle has normal systolic function.The right ventricular size is normal. No increase in right ventricular wall thickness. 4. Left atrial size was moderately dilated. 5. Right atrial size was mildly dilated. 6. Trivial pericardial effusion is  present. 7. The mitral valve is normal in structure. Trace mitral valve regurgitation. 8. The tricuspid valve is normal in structure. Tricuspid valve regurgitation is mild. 9. The aortic valve is tricuspid. Aortic valve regurgitation is not visualized. 10. The pulmonic valve was not well visualized. Pulmonic valve regurgitation is not visualized. 11. The tricuspid regurgitant velocity is 2.79 m/s, and with an assumed right atrial pressure of 8 mmHg, the estimated right ventricular systolic pressure is mildly elevated at 39.1 mmHg. 12. The inferior vena cava is normal in size with <50% respiratory variability, suggesting right atrial pressure of 8 mmHg. 13. The interatrial septum was not well visualized.  Antimicrobials: Rocephin 10/22 >> 10/25 Vanc (staph in blood ) 10/24  Cefazolin 10/25 >>   Subjective: Patient's health proxy [name] refused patient going to hemodialysis because he is here visiting patient.  After much discussion he change his mind and he will outpatient to go to dialysis today.  Patient seen earlier and examined and she has no complaints.  Denies shortness of breath, chest pain, or any other symptoms.  She is more interactive today.  Objective: Vitals:   08/28/19 1652 08/28/19 2014 08/29/19 0013 08/29/19 0430  BP: 119/77 (!) 121/96 (!) 142/77 137/82  Pulse: (!) 101 100 99 89  Resp: (!) 23 (!) 26 (!) 30 (!) 25  Temp: 97.6 F (36.4 C) 98.9 F (37.2 C) 98.9 F (37.2 C) 98.4 F (36.9 C)  TempSrc: Axillary Axillary Axillary Axillary  SpO2: 99% 96% 95% 98%  Weight:    77.7 kg  Height:        Intake/Output Summary (Last 24 hours) at 08/29/2019 0831 Last data filed at 08/28/2019 1856 Gross per 24 hour  Intake 610 ml  Output -  Net 610 ml   Filed Weights   08/27/19 0435 08/28/19 0444  08/29/19 0430  Weight: 76.9 kg 76.6 kg 77.7 kg    Examination: General exam:Appears calm,NAD interactive with exam, planning to questions HEENT:EOMI, NG tube in place   Respiratory system: Clear to auscultationanteriorly, nowheezing or rhonchi Cardiovascular system: Regular S1 &S2 heard, No murmurs, rubs, gallops. Right chest:HD cath site clean, nothing oozing Gastrointestinal system:Abdomen is nondistended, soft and nontender.Normal bowel sounds heard. Central nervous system:awake, alert, interactive. Extremities:Trace pedal edema bilaterally, Generalized edema b/l UE Skin:Warm and dry Psychiatry:Mood &affectappearsappropriate for current setting    Data Reviewed: I have personally reviewed following labs and imaging studies  CBC: Recent Labs  Lab 08/25/19 0017 08/26/19 0404 08/27/19 0320 08/28/19 0258 08/28/19 1400 08/29/19 0312  WBC 15.8* 16.0* 18.9* 19.2*  --  17.7*  HGB 8.0* 7.6* 7.1* 5.9* 8.8* 8.2*  HCT 25.9* 24.0* 22.9* 18.2* 26.4* 24.3*  MCV 91.2 90.9 91.6 89.2  --  85.0  PLT 453* 445* 427* 426*  --  [id number]   Basic Metabolic Panel: Recent Labs  Lab 08/23/19 1154  08/25/19 0017 08/26/19 0404 08/27/19 0320 08/28/19 0258 08/29/19 0312  NA 135   < > 135 135 135 133* 135  K 4.6   < > 4.2 5.1 4.1 4.9 5.5*  CL 95*   < > 96* 94* 96* 94* 93*  CO2 28   < > 25 26 27 25 25   GLUCOSE 148*   < > 133* 131* 146* 142* 142*  BUN 30*   < > 21 47* 22 37* 55*  CREATININE 2.30*   < > 1.78* 3.06* 2.11* 3.11* 4.00*  CALCIUM 8.9   < > 8.6* 9.0 8.8* 9.2 9.2  MG 2.0  --   --   --   --   --   --   PHOS  --    < > 2.8 4.2 3.1 4.3 4.0   < > = values in this interval not displayed.   GFR: Estimated Creatinine Clearance: 13.4 mL/min (A) (by C-G formula based on SCr of 4 mg/dL (H)). Liver Function Tests: No results for input(s): AST, ALT, ALKPHOS, BILITOT, PROT, ALBUMIN in the last 168 hours. No results for input(s): LIPASE, AMYLASE in the last 168 hours. No results for input(s): AMMONIA in the last 168 hours. Coagulation Profile: No results for input(s): INR, PROTIME in the last 168 hours. Cardiac Enzymes: No results for input(s):  CKTOTAL, CKMB, CKMBINDEX, TROPONINI in the last 168 hours. BNP (last 3 results) No results for input(s): PROBNP in the last 8760 hours. HbA1C: No results for input(s): HGBA1C in the last 72 hours. CBG: Recent Labs  Lab 08/28/19 1630 08/28/19 2019 08/29/19 0011 08/29/19 0427 08/29/19 0827  GLUCAP 140* 147* 162* 157* 123*   Lipid Profile: No results for input(s): CHOL, HDL, LDLCALC, TRIG, CHOLHDL, LDLDIRECT in the last 72 hours. Thyroid Function Tests: No results for input(s): TSH, T4TOTAL, FREET4, T3FREE, THYROIDAB in the last 72 hours. Anemia Panel: No results for input(s): VITAMINB12, FOLATE, FERRITIN, TIBC, IRON, RETICCTPCT in the last 72 hours. Sepsis Labs: No results for input(s): PROCALCITON, LATICACIDVEN in the last 168 hours.  No results found for this or any previous visit (from the past 240 hour(s)).       Radiology Studies: No results found.      Scheduled Meds: .  stroke: mapping our early stages of recovery book   Does not apply Once  . sodium chloride   Intravenous Once  . ARIPiprazole  10 mg Oral BID  . atorvastatin  20 mg Per  NG tube Daily  . chlorhexidine  15 mL Mouth Rinse BID  . Chlorhexidine Gluconate Cloth  6 each Topical Q0600  . Chlorhexidine Gluconate Cloth  6 each Topical Q0600  . feeding supplement (NEPRO CARB STEADY)  237 mL Oral BID BM  . ferrous sulfate  300 mg Per Tube Q breakfast  . insulin aspart  0-9 Units Subcutaneous Q4H  . mouth rinse  15 mL Mouth Rinse BID  . midodrine  10 mg Per Tube TID WC  . multivitamin  1 tablet Oral QHS  . pantoprazole sodium  40 mg Per Tube Daily  . sodium chloride flush  10-40 mL Intracatheter Q12H  . Thrombi-Pad  1 each Topical Once  . valproic acid  250 mg Per Tube Q6H   Continuous Infusions: . sodium chloride 10 mL (08/20/19 2025)  . sodium chloride    . sodium chloride    . amiodarone 30 mg/hr (08/29/19 0712)  .  ceFAZolin (ANCEF) IV Stopped (08/26/19 1151)  . feeding supplement (OSMOLITE  1.5 CAL) 1,000 mL (08/28/19 0109)    Assessment & Plan:   Principal Problem:   MSSA bacteremia Active Problems:   Cardiac arrest ([place])   Pressure injury of skin   Acute respiratory failure with hypoxia (HCC)   AKI (acute kidney injury) (HCC)   Elevated troponin   Persistent atrial fibrillation (HCC)   Acute on chronic combined systolic and diastolic CHF (congestive heart failure) (HCC)   Acute bacterial endocarditis   Malnutrition of moderate degree   Advanced care planning/counseling discussion   Goals of care, counseling/discussion   Palliative care by specialist   Closed fracture of multiple ribs   Cerebral embolism with cerebral infarction   Acute hypoxic respiratory failure due to cardiac arrest and pulmonary edema/bilateral pleural effusions:  Presented after out of hospital cardiac arrest. -Wasintubated ands/pextubated,on nasal cannula O2 -Resolved,  nephrology mx vol. Status via HD -Continue chest physiotherapy and incentive spirometry if patient can tolerate.  Improvingacute metabolic encephalopathy suspect multifactorial with recent multiple acute CVA:In the setting of cardiac arrest hypothermia, MSSA bacteremia, prolonged hospitalization, versus others. CT head 08/16/2019 stable. On 08/22/2019 mental status was improved, continues to be improving today. On valproic acid as longstanding medications.  EEG with no evidence of seizure activity. Palliative care team following. MRI done on 08/19/2019 showed several scattered small acute infarcts in the white matter of the superior frontal lobes, left occipital lobe. No associated hemorrhage or mass-effect. Also several small subacute or perhaps chronic infarcts in the bilateral cerebellum. Neurologyon board. -TTE-no evidence of vegetation or thrombus. -CT head without contrast on 08/21/2019 no sign of intracranial acute hemorrhage or acute ischemia. Cotninues to improve .   HD cath site bleed (on 11/15)  IR saw pt-->s/p 10 cc 1% Lidocaine with Epinephrine was injected into subcutaneous tract. s/p #1, 0-prolene mattress suture applied to site near catheter  Now stable     Multiple acute/subacute CVA involving white matter of the superior frontal lobes and left occipital lobe No hemorrhage, on heparin drip, neurology following. Neuro had okayed for anticoagulation to be started previously   Witnessed cardiac arrest in the setting of history of chronic atrial fibrillation and coronary artery disease: Patient treated with hypothermia protocol. -LHC initially deferred due to AoCKD in an attempt to avoid HD.  EF initially down to 20-25% 08/03/2019, improved to 55-60% on echo 08/21/2019.  -cardsplans todiscuss ischemic evaluation with Dr. Meda Coffee now that patient is on HD-final decision pending.   Afib- with intermittent rvr  Was SR, went back to afib rvr, cards was reconsulted.  Rateis better controlled today.  Cardiology following. Recommend continuing amiodarone drip for rate/rhythm control Onheparin drip plan to transition to Eliquis 5 mg twice daily prior to discharge for stroke prevention.  Could consider TEE/DCCV if A. fib flutter persist per cardiology Had TEE/DCCV earlier this year.  Cards following   Acute on chronic systolic heart failure: TTE on 10/22 with EF 20%.  Repeat TEE with improved ejection fraction to 60%.  CAD evaluation pending,initially held due to renal issues now on hemodialysis cards following Appears to be more compensated  Volume management per HD/nephrology   MSSA bacteremia Repeat cultures negative.  Seen by infectious diseaserecommended cefazolin until 12/7. TTEno evidence of endocarditis or thrombus.   Acute blood loss anemia No sign of overt bleedingbefore, but  bleed from HD cath site 11/15 S/pTransfused1 unit PRBC during this hospitalization Hg 5/9 this am...>receiving 1 unit transfusion now. Will ck h/h post  transfusion   AKI on CKD stage IV:  Suspected AKI likely ATN in the setting of shock ,cardiac arrest and bacteremia.  Started on CRRTinitially.10/30-11/1 -Now on hemodialysis. Foley was d/c'd on 11/14 -Kidney ultrasound showed solitary right kidney with cortical thinning and cysts. -Status post tunneled hemodialysis catheter placed --Nephrology following. -cont to eval for possible AVF or AVG, too debilitated currently   Hyperkalemia Resolved Mx bynephrology with HD   Nutrition on tube feeding via core track tubecurrently. Speech therapyrec.Dysphagia 1 (Puree) solids; thin liquid- tolerating so far. Needs full assist for feeding;seated upright at 90 degrees. RD rec.Nepro Shake poBID Renal MVI daily D/c prostat Transition to nocturnal feedings:Osmolite 1.5@60ml /hr via cortrak over 12 hour period Continue aspiration precautions    Physical debility/ambulatory dysfunction PT/OT-recommended SNF CSW-LTACH placement.  DVT prophylaxis:Heparin drip, scd Code Status:Full code Family Communication:No one at bedside Disposition Plan:Plan for LTAC, casemx working with family/health proxy [name] on this.       LOS: 26 days   Time spent: 45 minutes with more than 50% COC    Nolberto Hanlon, MD [facility] Pager [phone]  If 7PM-7AM, please contact night-coverage [email or web address] Password [email or web address] 08/29/2019, 8:31 AM

## 2019-08-30 DIAGNOSIS — R7881 Bacteremia: Secondary | ICD-10-CM | POA: Diagnosis not present

## 2019-08-30 DIAGNOSIS — B9561 Methicillin susceptible Staphylococcus aureus infection as the cause of diseases classified elsewhere: Secondary | ICD-10-CM | POA: Diagnosis not present

## 2019-08-30 LAB — CBC
HCT: 23.9 % — ABNORMAL LOW (ref 36.0–46.0)
Hemoglobin: 7.8 g/dL — ABNORMAL LOW (ref 12.0–15.0)
MCH: 28.7 pg (ref 26.0–34.0)
MCHC: 32.6 g/dL (ref 30.0–36.0)
MCV: 87.9 fL (ref 80.0–100.0)
Platelets: 312 10*3/uL (ref 150–400)
RBC: 2.72 MIL/uL — ABNORMAL LOW (ref 3.87–5.11)
RDW: 17.3 % — ABNORMAL HIGH (ref 11.5–15.5)
WBC: 15.7 10*3/uL — ABNORMAL HIGH (ref 4.0–10.5)
nRBC: 0 % (ref 0.0–0.2)

## 2019-08-30 LAB — BASIC METABOLIC PANEL
Anion gap: 12 (ref 5–15)
BUN: 27 mg/dL — ABNORMAL HIGH (ref 8–23)
CO2: 28 mmol/L (ref 22–32)
Calcium: 9 mg/dL (ref 8.9–10.3)
Chloride: 99 mmol/L (ref 98–111)
Creatinine, Ser: 2.85 mg/dL — ABNORMAL HIGH (ref 0.44–1.00)
GFR calc Af Amer: 18 mL/min — ABNORMAL LOW (ref >60)
GFR calc non Af Amer: 16 mL/min — ABNORMAL LOW (ref >60)
Glucose, Bld: 135 mg/dL — ABNORMAL HIGH (ref 70–99)
Potassium: 4.7 mmol/L (ref 3.5–5.1)
Sodium: 139 mmol/L (ref 135–145)

## 2019-08-30 LAB — GLUCOSE, CAPILLARY
Glucose-Capillary: 106 mg/dL — ABNORMAL HIGH (ref 70–99)
Glucose-Capillary: 118 mg/dL — ABNORMAL HIGH (ref 70–99)
Glucose-Capillary: 127 mg/dL — ABNORMAL HIGH (ref 70–99)
Glucose-Capillary: 133 mg/dL — ABNORMAL HIGH (ref 70–99)
Glucose-Capillary: 141 mg/dL — ABNORMAL HIGH (ref 70–99)
Glucose-Capillary: 151 mg/dL — ABNORMAL HIGH (ref 70–99)

## 2019-08-30 LAB — PHOSPHORUS: Phosphorus: 3.1 mg/dL (ref 2.5–4.6)

## 2019-08-30 MED ORDER — WARFARIN - PHARMACIST DOSING INPATIENT
Freq: Every day | Status: DC
  Administered 2019-09-03: 18:00:00 1
  Administered 2019-09-09 – 2019-09-23 (×5)

## 2019-08-30 MED ORDER — THIAMINE HCL 100 MG/ML IJ SOLN
100.0000 mg | Freq: Every day | INTRAMUSCULAR | Status: DC
  Administered 2019-08-30 – 2019-09-05 (×8): 100 mg via INTRAVENOUS
  Filled 2019-08-30 (×8): qty 2

## 2019-08-30 MED ORDER — CEFAZOLIN SODIUM-DEXTROSE 2-4 GM/100ML-% IV SOLN
2.0000 g | Freq: Once | INTRAVENOUS | Status: AC
  Administered 2019-08-30: 2 g via INTRAVENOUS
  Filled 2019-08-30: qty 100

## 2019-08-30 MED ORDER — NYSTATIN 100000 UNIT/ML MT SUSP
5.0000 mL | Freq: Four times a day (QID) | OROMUCOSAL | Status: DC
  Administered 2019-08-30 – 2019-09-13 (×47): 500000 [IU] via ORAL
  Filled 2019-08-30 (×47): qty 5

## 2019-08-30 MED ORDER — WARFARIN SODIUM 5 MG PO TABS
5.0000 mg | ORAL_TABLET | Freq: Once | ORAL | Status: AC
  Administered 2019-08-30: 5 mg via ORAL
  Filled 2019-08-30: qty 1

## 2019-08-30 NOTE — Progress Notes (Addendum)
PROGRESS NOTE    Jaclyn Day  [id number] DOB: 05-22-1946 DOA: 08/03/2019 PCP: Cyndi Bender, PA-C   Brief Narrative: 73 year old past medical history significant for congenital unilateral kidney, stage IV chronic kidney disease with baseline creatinine about 2.2--2.8, hypertension, persistent A. fib on Eliquis, chronic diastolic heart failure, history of cardioversion for A. fib admitted to ICU after witnessed cardiac arrest.  On 10/22 patient was admitted after cardiac arrest, intubated, initiation of targeted temperature management 36 Celsius.  Echo showed left ventricular ejection fraction 20 to 25%, global hypokinesis, RV mildly reduced function with normal size. On 10/25: Blood cultures came back positive for staph.  She was a started on vancomycin. Patient was a started on amiodarone drip, she is off of Levophed and neo-.  She was a started on heparin drip, which was subsequently discontinued.  She was also started on hemodialysis. 10/26: Patient was started on Precedex for agitation, femoral CVL was discontinued.    Assessment & Plan:   Principal Problem:   MSSA bacteremia Active Problems:   Cardiac arrest ([place])   Pressure injury of skin   Acute respiratory failure with hypoxia (HCC)   AKI (acute kidney injury) (HCC)   Elevated troponin   Persistent atrial fibrillation (HCC)   Acute on chronic combined systolic and diastolic CHF (congestive heart failure) (HCC)   Acute bacterial endocarditis   Malnutrition of moderate degree   Advanced care planning/counseling discussion   Goals of care, counseling/discussion   Palliative care by specialist   Closed fracture of multiple ribs   Cerebral embolism with cerebral infarction  1-Acute hypoxic respiratory failure due to cardiac arrest and pulmonary edema: Bilateral pleural effusion: Patient presented after outside hospital cardiac arrest Patient was intubated and is status post extubation, on nasal cannula oxygen  Nephrology managing volume status via hemodialysis  2-Acute metabolic encephalopathy, suspect multifactorial with recent multiple acute CVA -Setting of cardiac arrest, hypothermia, MSSA bacteremia, prolonged hospitalization. CT head 11/11 stable Mental status is slowly improving.  Still sleepy at times. On valproic acid continue with as long standing medication EEG negative for seizure Palliative care was consulted. MRI on 08/19/2019 showed several scattered small acute infarct in the white matter of the superior frontal lobe, left occipital lobe.  Several small subacute chronic infarct in bilateral cerebellum. Neurology help with patient care. TTE no vegetation or thrombus.  3-Hemodialysis catheter side related: 11/15 IR saw the patient, status post 10 cc 1% lidocaine with epinephrine was injected into subcutaneous tract Stable  4-Multiple acute subacute CVA involving white matter: Patient was on heparin drip.  Neurology was okay with anticoagulation.  5-Witnessed cardiac arrest in the setting of chronic A. Fib: Patient treated with hypothermia protocol Left heart cath initially deferred due to acute on chronic renal failure Ejection fraction initially down to 20 to 25% on 10/20-second/2020, improved to 55 to 60% on echo 11/90/2020. Follow cardiology recommendation in regard ischemic evaluation  6-A. fib with RVR: Cardiology was following. Follow-up with cardiology for further evaluation and consideration of TEE and DCCV Heparin on hold due to anemia. Discussed with Dr. Gillian Shields, with cardiology in regards anticoagulation.  Patient with renal failure, now with hemodialysis, anemia.  We are considering resuming Coumadin instead of Eliquis due to multiple risk factors for bleeding.  We will start Coumadin tonight, hold in bridge with heparin due to recent bleeding, anemia  7-acute on chronic systolic heart failure: TTE on 10/22 with EF 20%.  Repeat TEE with improved ejection fraction to  60%.  Volume management  with hemodialysis.  8-MSSA bacteremia: Repeat blood cultures negative. ID recommended cefazolin until 12/7 TTE no evidence of endocarditis or thrombus.  9-Acute blood loss anemia: No sign of overt bleeding, she had bled from hemodialysis catheter on 11/15. Status post transfuse 1 unit of packed red blood cell. Hs has remain 8 range,  10-AKI on chronic kidney disease a stage IV: Suspect AKI likely ATN in the setting of shock, cardiac arrest bacteremia. She was initially started on CRRT 10/30---11/1 Currently getting hemodialysis. Draining well for possible aVF AV graft  11-Hyperkalemia: Resolved  12-Nutrition: On tube feeding via core track tube He was a started on dysphagia 1 diet. He was transitioned to nocturnal feeding Osmolite. Monitor oral intake to determine removal of tube feeding  13-oral thrush; start nystatin.  Nutrition Problem: Moderate Malnutrition Etiology: acute illness(acute respiratory failure, AKI required CRRT, endocarditis)    Signs/Symptoms: mild fat depletion, mild muscle depletion, moderate muscle depletion    Interventions: Tube feeding  Estimated body mass index is 26.9 kg/m as calculated from the following:   Height as of this encounter: 5\' 6"  (1.676 m).   Weight as of this encounter: 75.6 kg.   DVT prophylaxis: Heparin Code Status: Full code Family Communication: No family at bedside Disposition Plan: Remain in the hospital for amiodarone infusion, antibiotics, hemodialysis will require LTAC Consultants:  Nephrology IR Neurology Palliative care Cardiology  Procedures:  placement of tunneled HD catheter, CRRT, HD  Antimicrobials:  Rocephin 10/22 >> 10/25 Vanc (staph in blood ) 10/24  Cefazolin 10/25 >>  Subjective: Patient is sleepy, open eyes, answer simple questions.  Denies pain.  She need to be fed.  Objective: Vitals:   08/30/19 0025 08/30/19 0300 08/30/19 0757 08/30/19 1141  BP: 104/61 98/61   (!) 110/50  Pulse: 100 80    Resp: (!) 24 (!) 24    Temp: 98.4 F (36.9 C) 98.9 F (37.2 C) 98.8 F (37.1 C) 98.9 F (37.2 C)  TempSrc: Axillary Axillary Axillary Axillary  SpO2: 98% 100%    Weight:      Height:        Intake/Output Summary (Last 24 hours) at 08/30/2019 1450 Last data filed at 08/29/2019 1630 Gross per 24 hour  Intake -  Output 2330 ml  Net -2330 ml   Filed Weights   08/29/19 0430 08/29/19 1310 08/29/19 1630  Weight: 77.7 kg 78.3 kg 75.6 kg    Examination:  General exam: Appears calm and comfortable  Respiratory system: Clear to auscultation. Respiratory effort normal. Cardiovascular system: S1 & S2 heard, RRR. No JVD, murmurs, rubs, gallops or clicks. No pedal edema. Gastrointestinal system: Abdomen is nondistended, soft and nontender. No organomegaly or masses felt. Normal bowel sounds heard. Central nervous system: sleepy, answer simple questions.  Extremities: Symmetric 5 x 5 power. Skin: No rashes, lesions or ulcers     Data Reviewed: I have personally reviewed following labs and imaging studies  CBC: Recent Labs  Lab 08/26/19 0404 08/27/19 0320 08/28/19 0258 08/28/19 1400 08/29/19 0312 08/30/19 0422  WBC 16.0* 18.9* 19.2*  --  17.7* 15.7*  HGB 7.6* 7.1* 5.9* 8.8* 8.2* 7.8*  HCT 24.0* 22.9* 18.2* 26.4* 24.3* 23.9*  MCV 90.9 91.6 89.2  --  85.0 87.9  PLT 445* 427* 426*  --  [id number]   Basic Metabolic Panel: Recent Labs  Lab 08/26/19 0404 08/27/19 0320 08/28/19 0258 08/29/19 0312 08/30/19 0422 08/30/19 0832  NA 135 135 133* 135  --  139  K 5.1 4.1 4.9  5.5*  --  4.7  CL 94* 96* 94* 93*  --  99  CO2 26 27 25 25   --  28  GLUCOSE 131* 146* 142* 142*  --  135*  BUN 47* 22 37* 55*  --  27*  CREATININE 3.06* 2.11* 3.11* 4.00*  --  2.85*  CALCIUM 9.0 8.8* 9.2 9.2  --  9.0  PHOS 4.2 3.1 4.3 4.0 3.1  --    GFR: Estimated Creatinine Clearance: 18.5 mL/min (A) (by C-G formula based on SCr of 2.85 mg/dL (H)). Liver Function Tests: No  results for input(s): AST, ALT, ALKPHOS, BILITOT, PROT, ALBUMIN in the last 168 hours. No results for input(s): LIPASE, AMYLASE in the last 168 hours. No results for input(s): AMMONIA in the last 168 hours. Coagulation Profile: No results for input(s): INR, PROTIME in the last 168 hours. Cardiac Enzymes: No results for input(s): CKTOTAL, CKMB, CKMBINDEX, TROPONINI in the last 168 hours. BNP (last 3 results) No results for input(s): PROBNP in the last 8760 hours. HbA1C: No results for input(s): HGBA1C in the last 72 hours. CBG: Recent Labs  Lab 08/29/19 2000 08/30/19 0032 08/30/19 0430 08/30/19 0753 08/30/19 1140  GLUCAP 130* 151* 141* 106* 127*   Lipid Profile: No results for input(s): CHOL, HDL, LDLCALC, TRIG, CHOLHDL, LDLDIRECT in the last 72 hours. Thyroid Function Tests: No results for input(s): TSH, T4TOTAL, FREET4, T3FREE, THYROIDAB in the last 72 hours. Anemia Panel: No results for input(s): VITAMINB12, FOLATE, FERRITIN, TIBC, IRON, RETICCTPCT in the last 72 hours. Sepsis Labs: No results for input(s): PROCALCITON, LATICACIDVEN in the last 168 hours.  Recent Results (from the past 240 hour(s))  Culture, blood (routine x 2)     Status: None (Preliminary result)   Collection Time: 08/28/19 12:00 PM   Specimen: BLOOD  Result Value Ref Range Status   Specimen Description BLOOD BLOOD LEFT HAND  Final   Special Requests   Final    AEROBIC BOTTLE ONLY Blood Culture results may not be optimal due to an inadequate volume of blood received in culture bottles   Culture   Final    NO GROWTH 2 DAYS Performed at [facility] [address] [place], [place] [address]    Report Status PENDING  Incomplete  Culture, blood (routine x 2)     Status: None (Preliminary result)   Collection Time: 08/28/19 12:00 PM   Specimen: BLOOD  Result Value Ref Range Status   Specimen Description BLOOD LEFT ANTECUBITAL  Final   Special Requests   Final    AEROBIC BOTTLE ONLY Blood  Culture results may not be optimal due to an inadequate volume of blood received in culture bottles   Culture   Final    NO GROWTH 2 DAYS Performed at [facility], [place] [address] [place], [place] [address]    Report Status PENDING  Incomplete         Radiology Studies: No results found.      Scheduled Meds: .  stroke: mapping our early stages of recovery book   Does not apply Once  . ARIPiprazole  10 mg Oral BID  . atorvastatin  20 mg Per NG tube Daily  . chlorhexidine  15 mL Mouth Rinse BID  . Chlorhexidine Gluconate Cloth  6 each Topical Q0600  . Chlorhexidine Gluconate Cloth  6 each Topical Q0600  . feeding supplement (NEPRO CARB STEADY)  237 mL Oral BID BM  . ferrous sulfate  300 mg Per Tube  Q breakfast  . insulin aspart  0-9 Units Subcutaneous Q4H  . mouth rinse  15 mL Mouth Rinse BID  . midodrine  10 mg Per Tube TID WC  . multivitamin  1 tablet Oral QHS  . nystatin  5 mL Oral QID  . pantoprazole sodium  40 mg Per Tube Daily  . sodium chloride flush  10-40 mL Intracatheter Q12H  . thiamine injection  100 mg Intravenous Daily  . Thrombi-Pad  1 each Topical Once  . valproic acid  250 mg Per Tube Q6H   Continuous Infusions: . sodium chloride 10 mL (08/20/19 2025)  . sodium chloride    . sodium chloride    . amiodarone 30 mg/hr (08/30/19 0715)  .  ceFAZolin (ANCEF) IV Stopped (08/26/19 1151)  . feeding supplement (OSMOLITE 1.5 CAL) 1,000 mL (08/29/19 2034)     LOS: 27 days    Time spent: 35 minutes.     Elmarie Shiley, MD [facility]  If 7PM-7AM, please contact night-coverage [email or web address] Password [email or web address] 08/30/2019, 2:50 PM

## 2019-08-30 NOTE — Plan of Care (Signed)
  Problem: Education: Goal: Knowledge of General Education information will improve Description: Including pain rating scale, medication(s)/side effects and non-pharmacologic comfort measures Outcome: Progressing   Problem: Health Behavior/Discharge Planning: Goal: Ability to manage health-related needs will improve Outcome: Progressing   Problem: Clinical Measurements: Goal: Ability to maintain clinical measurements within normal limits will improve Outcome: Progressing Goal: Will remain free from infection Outcome: Progressing Goal: Diagnostic test results will improve Outcome: Progressing   Problem: Activity: Goal: Risk for activity intolerance will decrease Outcome: Progressing   Problem: Nutrition: Goal: Adequate nutrition will be maintained Outcome: Progressing   Problem: Coping: Goal: Level of anxiety will decrease Outcome: Progressing   Problem: Pain Managment: Goal: General experience of comfort will improve Outcome: Progressing   Problem: Safety: Goal: Ability to remain free from injury will improve Outcome: Progressing   Problem: Skin Integrity: Goal: Risk for impaired skin integrity will decrease Outcome: Progressing   Kaz Auld C Taveon Enyeart, RN 

## 2019-08-30 NOTE — Progress Notes (Signed)
[place] for Heparin  Indication: atrial fibrillation  Allergies  Allergen Reactions  . Codeine Other (See Comments)    Reaction not recalled by family- was told to never take this    Patient Measurements: Height: 5\' 6"  (167.6 cm) Weight: 166 lb 10.7 oz (75.6 kg) IBW/kg (Calculated) : 59.3 Heparin Dosing Weight: 78 kg  Vital Signs: Temp: 98.8 F (37.1 C) (11/18 0757) Temp Source: Axillary (11/18 0757) BP: 98/61 (11/18 0300) Pulse Rate: 80 (11/18 0300)  Labs: Recent Labs    08/28/19 0258 08/28/19 1400 08/29/19 0312 08/30/19 0422 08/30/19 0832  HGB 5.9* 8.8* 8.2* 7.8*  --   HCT 18.2* 26.4* 24.3* 23.9*  --   PLT 426*  --  381 312  --   HEPARINUNFRC 0.22*  --   --   --   --   CREATININE 3.11*  --  4.00*  --  2.85*    Estimated Creatinine Clearance: 18.5 mL/min (A) (by C-G formula based on SCr of 2.85 mg/dL (H)).  Assessment: Pt is a 73 yr old female admitted S/P out of hospital cardiac arrest. ROSC was achieved and targeted temperature management was initiated. This admission has been complicated by continued encephalopathy, AKI on CKD which has advanced to dialysis, and hypotension.    MRI on 11/7 found several small acute infarcts (possibly embolic in nature), though no hemorrhage. Pt continues on heparin for Afib. Plans to change back to apixaban when taking po well  Hgb down to 5.9 on 11/16 with bleeding from HD line. Heparin has been on hold. Hgb improved slightly s/p pRBCs. Discussed with cardiology 11/18, will keep holding heparin for now. Pharmacy will sign off and follow peripherally, reconsult when therapeutic heparin is indicated.    Goal of Therapy:  Heparin level 0.3-0.5 units/ml Monitor platelets by anticoagulation protocol: Yes   Plan:  -Continue to hold heparin for now per cardiology -Pharmacy will sign off as above   Arrie Senate, PharmD, BCPS Clinical Pharmacist [phone] Please check AMION for all [place] numbers 08/30/2019

## 2019-08-30 NOTE — Progress Notes (Addendum)
Progress Note  Patient Name: Jaclyn Day Date of Encounter: 08/30/2019  Primary Cardiologist: Ena Dawley, MD   Subjective   Patient is just waking up. She denies CP or trouble breathing.   Inpatient Medications    Scheduled Meds: .  stroke: mapping our early stages of recovery book   Does not apply Once  . sodium chloride   Intravenous Once  . ARIPiprazole  10 mg Oral BID  . atorvastatin  20 mg Per NG tube Daily  . chlorhexidine  15 mL Mouth Rinse BID  . Chlorhexidine Gluconate Cloth  6 each Topical Q0600  . Chlorhexidine Gluconate Cloth  6 each Topical Q0600  . feeding supplement (NEPRO CARB STEADY)  237 mL Oral BID BM  . ferrous sulfate  300 mg Per Tube Q breakfast  . insulin aspart  0-9 Units Subcutaneous Q4H  . mouth rinse  15 mL Mouth Rinse BID  . midodrine  10 mg Per Tube TID WC  . multivitamin  1 tablet Oral QHS  . pantoprazole sodium  40 mg Per Tube Daily  . sodium chloride flush  10-40 mL Intracatheter Q12H  . Thrombi-Pad  1 each Topical Once  . valproic acid  250 mg Per Tube Q6H   Continuous Infusions: . sodium chloride 10 mL (08/20/19 2025)  . sodium chloride    . sodium chloride    . amiodarone 30 mg/hr (08/30/19 0715)  .  ceFAZolin (ANCEF) IV Stopped (08/26/19 1151)  . feeding supplement (OSMOLITE 1.5 CAL) 1,000 mL (08/29/19 2034)   PRN Meds: sodium chloride, sodium chloride, acetaminophen (TYLENOL) oral liquid 160 mg/5 mL, alteplase, heparin, hydrALAZINE, HYDROmorphone (DILAUDID) injection, levalbuterol, lidocaine (PF), lidocaine-prilocaine, pentafluoroprop-tetrafluoroeth, sodium chloride flush   Vital Signs    Vitals:   08/29/19 1954 08/30/19 0025 08/30/19 0300 08/30/19 0757  BP: 139/74 104/61 98/61   Pulse: (!) 104 100 80   Resp: (!) 24 (!) 24 (!) 24   Temp: 98 F (36.7 C) 98.4 F (36.9 C) 98.9 F (37.2 C) 98.8 F (37.1 C)  TempSrc: Oral Axillary Axillary Axillary  SpO2: 99% 98% 100%   Weight:      Height:         Intake/Output Summary (Last 24 hours) at 08/30/2019 0847 Last data filed at 08/29/2019 1630 Gross per 24 hour  Intake 111 ml  Output 2330 ml  Net -2219 ml   Last 3 Weights 08/29/2019 08/29/2019 08/29/2019  Weight (lbs) 166 lb 10.7 oz 172 lb 9.9 oz 171 lb 4.8 oz  Weight (kg) 75.6 kg 78.3 kg 77.7 kg      Telemetry    Afib, HR in the 80-90s, pause 1.62 seconds - Personally Reviewed  ECG    No new - Personally Reviewed  Physical Exam   GEN: No acute distress.   Neck: No JVD Cardiac: Irreg Irreg, no murmurs, rubs, or gallops.  Respiratory: Clear to auscultation bilaterally. GI: Soft, nontender, non-distended  MS: No edema; No deformity. Neuro:  Nonfocal  Psych: Normal affect   Labs    High Sensitivity Troponin:   Recent Labs  Lab 08/03/19 1013 08/03/19 1147 08/03/19 1550 08/04/19 0934  TROPONINIHS 48* 715* 2,793* 3,543*      Chemistry Recent Labs  Lab 08/27/19 0320 08/28/19 0258 08/29/19 0312  NA 135 133* 135  K 4.1 4.9 5.5*  CL 96* 94* 93*  CO2 27 25 25   GLUCOSE 146* 142* 142*  BUN 22 37* 55*  CREATININE 2.11* 3.11* 4.00*  CALCIUM 8.8* 9.2  9.2  GFRNONAA 23* 14* 11*  GFRAA 26* 17* 12*  ANIONGAP 12 14 17*     Hematology Recent Labs  Lab 08/28/19 0258 08/28/19 1400 08/29/19 0312 08/30/19 0422  WBC 19.2*  --  17.7* 15.7*  RBC 2.04*  --  2.86* 2.72*  HGB 5.9* 8.8* 8.2* 7.8*  HCT 18.2* 26.4* 24.3* 23.9*  MCV 89.2  --  85.0 87.9  MCH 28.9  --  28.7 28.7  MCHC 32.4  --  33.7 32.6  RDW 17.2*  --  17.3* 17.3*  PLT 426*  --  381 312    BNPNo results for input(s): BNP, PROBNP in the last 168 hours.   DDimer No results for input(s): DDIMER in the last 168 hours.   Radiology    No results found.  Cardiac Studies   Echo 08/21/19 IMPRESSIONS  1. Left ventricular ejection fraction, by visual estimation, is 55 to 60%. The left ventricle has normal function. There is moderately increased left ventricular hypertrophy. 2. Left ventricular  diastolic parameters are indeterminate. 3. Global right ventricle has normal systolic function.The right ventricular size is normal. No increase in right ventricular wall thickness. 4. Left atrial size was moderately dilated. 5. Right atrial size was mildly dilated. 6. Trivial pericardial effusion is present. 7. The mitral valve is normal in structure. Trace mitral valve regurgitation. 8. The tricuspid valve is normal in structure. Tricuspid valve regurgitation is mild. 9. The aortic valve is tricuspid. Aortic valve regurgitation is not visualized. 10. The pulmonic valve was not well visualized. Pulmonic valve regurgitation is not visualized. 11. The tricuspid regurgitant velocity is 2.79 m/s, and with an assumed right atrial pressure of 8 mmHg, the estimated right ventricular systolic pressure is mildly elevated at 39.1 mmHg. 12. The inferior vena cava is normal in size with <50% respiratory variability, suggesting right atrial pressure of 8 mmHg. 13. The interatrial septum was not well visualized.   Patient Profile     73 y.o. female with a pmh of one kidney, CKD stage 5, HTN, chronic afib on Eliquis, Chronic diastolic HF who was admitted to ICU after witness cardiac arrest requiring intubation. Was found to be in afib RVR placed on amio and IV heparin. Was subsequently extubated. Found to have MSSA bacteremia and multiple acute infarcts. Cardiology previously following. Now noted to be Afib RVR and cardiology once again seeing patient.  Assessment & Plan    Atrial fibrillation -patient with intermittent Afib RVR this admission. -Started on an amiodarone gtt and transitioned to po after conversion to sinus rhythm, however back in Afib with RVR 08/24/2019 and was restarted on amiodarone gtt. -Patient is still in afib. HR currently controlled in the 90-low 100's -stress ofMSSA bacteremia and acute on chronic combined CHF which are likely contributing to RVR episodes.  -Continue  amiodarone gtt for rate/rhythm control -Heparin on hold for anemia. Plans to transition to eliquis 5mg  BID  - No TEE/DCCV if patient cannot be anticoagulated.   Post-arrest patient presented with out of hospital cardiac arrest, s/p cooling protocol.LHC initially deferred due tokidney functionin an attempt to avoid HD.  -EF initially down to 20-25% 08/03/2019, improved to 55-60% on echo 08/21/2019.   Acute on chronic combined CHF: -initial echo with EF 20-25% 08/03/2019 following out of hospital cardiac arrest.  -Repeat echo with normalization of EF to 55-60% 08/21/2019. -She was subsequently started on HD for volume management.Creatinine continues to worsen. -Continue volume management per HD  Metabolic encephalopathy: -likely multifactorial in the setting of post-arrest,  MSSA bacteremia, acute CVA, and hospital delirium. -Mental status slowly improving. EEG negative.  -Continue management per primary team  MSSA bacteremia -?vegetation on TEE. -ID following for management of ID antibiotics -Continue antibiotics per ID/primary team  CKD stage IV -started onintermittentHD this admission s/p TDC.  - creatinine continues to worsen.  - Continue management per Nephrology  Anemia -s/p 2 uPRBC this admission. - Hgb 7.8 - continue to hold heparin   For questions or updates, please contact [place] Please consult [email or web address] for contact info under        Signed, Cadence Ninfa Meeker, PA-C  08/30/2019, 8:47 AM    Personally seen and examined. Agree with above.   Appears comfortable.  No chest pain More alert, NG tube noted  Atrial fibrillation/flutter paroxysmal -Continue with amiodarone drip.  No other changes.  Candee Furbish, MD

## 2019-08-30 NOTE — Progress Notes (Signed)
  Speech Language Pathology Treatment: Dysphagia  Patient Details Name: Jaclyn Day MRN: [id number] DOB: 09/28/1946 Today's Date: 08/30/2019 Time: [id number] SLP Time Calculation (min) (ACUTE ONLY): 13 min  Assessment / Plan / Recommendation Clinical Impression  Pt is lethargic this afternoon, and has been throughout the day per her significant other. He says that he has gotten small bites/sips in her when she briefly awakens. SLP provided Max cues for arousal, at which time pt would take in small amounts. Purees were more readily accepted and swallowed with minimal oral residue that remained. She needed Max cues for acceptance of thin liquids, given impaired labial seal and lethargy. Her voice sounded wet x1 but this cleared spontaneously and there were no other overt s/s of aspiration. Results from [facility] were reinforced with her significant other and SLP also provided education about aspiration precautions in light of increased lethargy. Will continue to follow.   HPI HPI: Pt is a 73 yo F admitted for witnessed out of hospital cardiac arrest, unclear etiology, with field ROSC, resultant respiratory failure and encephalopathy s/p TTM; ETT 10/22-11/1. CRRT initiated 10/30. PMH: unilateral congenital absence of kidney, Afib, HTN, CKD, CHF, anemia, mood disorder.  MRI on 11/7 revealed "Several scattered small acute infarcts in the white matter of the superior frontal lobes, left occipital lobe. No associated hemorrhage or mass effect. 2. Several small subacute or perhaps chronic infarcts in the bilateral cerebellum."      SLP Plan  Continue with current plan of care;MBS       Recommendations  Diet recommendations: Dysphagia 1 (puree);Thin liquid Liquids provided via: Cup;Teaspoon Medication Administration: Crushed with puree Supervision: Staff to assist with self feeding;Full supervision/cueing for compensatory strategies Compensations: Minimize environmental distractions;Slow rate;Small  sips/bites(check oral cavity for residue) Postural Changes and/or Swallow Maneuvers: Seated upright 90 degrees                Oral Care Recommendations: Oral care BID;Oral care before and after PO Follow up Recommendations: LTACH SLP Visit Diagnosis: Dysphagia, oropharyngeal phase (R13.12) Plan: Continue with current plan of care;MBS       GO                Venita Sheffield Dylon Correa 08/30/2019, 2:52 PM  [name], M.A. [place] Acute Environmental education officer [phone] Office [phone]

## 2019-08-30 NOTE — Progress Notes (Signed)
ANTICOAGULATION CONSULT NOTE - Initial Consult  Pharmacy Consult for Warfarin Indication: atrial fibrillation  Allergies  Allergen Reactions  . Codeine Other (See Comments)    Reaction not recalled by family- was told to never take this    Patient Measurements: Height: 5\' 6"  (167.6 cm) Weight: 166 lb 10.7 oz (75.6 kg) IBW/kg (Calculated) : 59.3  Vital Signs: Temp: 98.9 F (37.2 C) (11/18 1141) Temp Source: Axillary (11/18 1141) BP: 110/50 (11/18 1141)  Labs: Recent Labs    08/28/19 0258 08/28/19 1400 08/29/19 0312 08/30/19 0422 08/30/19 0832  HGB 5.9* 8.8* 8.2* 7.8*  --   HCT 18.2* 26.4* 24.3* 23.9*  --   PLT 426*  --  381 312  --   HEPARINUNFRC 0.22*  --   --   --   --   CREATININE 3.11*  --  4.00*  --  2.85*    Estimated Creatinine Clearance: 18.5 mL/min (A) (by C-G formula based on SCr of 2.85 mg/dL (H)).   Medical History: Past Medical History:  Diagnosis Date  . Anemia   . Chronic diastolic CHF (congestive heart failure) ([place])   . CKD (chronic kidney disease), stage IV ([place])   . Hypertension   . Persistent atrial fibrillation ([place])    a. dx 01/2019 s/p TEE DCCV.  Marland Kitchen Renal disorder   . Unilateral congenital absence of kidney    Assessment: Pt is a 73 yr old female admitted S/P out of hospital cardiac arrest. ROSC was achieved and targeted temperature management was initiated. This admission has been complicated by continued encephalopathy, AKI on CKD which has advanced to dialysis, and hypotension.    MRI on 11/7 found several small acute infarcts (possibly embolic in nature), though no hemorrhage  Heparin has been held d/t bleeding, now to start warfarin (holding on bridge d/t recent bleed/anemia)) per cards.   Goal of Therapy:  INR 2-3 Monitor platelets by anticoagulation protocol: Yes   Plan:  Warfarin 5mg  PO x1 tonight Daily INR, s/s bleeding  Bertis Ruddy, PharmD Clinical Pharmacist Please check AMION for all [place]  numbers 08/30/2019 3:57 PM

## 2019-08-30 NOTE — Progress Notes (Signed)
[facility] Progress Note    Assessment/ Plan:   #dialysis dep't AoCKD4: CKD due to hypertension.  AKI likely ATN in the setting of shock, cardiac arrest and bacteremia.  She was on CRRT 10/30-11/1.   -Kidney ultrasound showed solitary right kidney with cortical thinning and cysts. -Seen by palliative, full scope of care, potentially to LTACH?  - s/p TDC with IR 11/11 - Cont iHD THS Schedule: 2-3L, 3.5h, no hep, 2K bath, TDC.  Next HD 11/19  - cont to eval for possible AVF or AVG, too debilitated currently  #Hyperkalemia: Cont HD  #Acute encephalopathy: somewhat improved but still present.  11/7 MRI with scattered small acute infarcts and chronic infarcts noted.  #Cardiac arrest, acute on chronic systolic CHF: EF [id number] by echo, cardiology is following.  EF improved in TEE.  #MSSA bacteremia: Continue cefazolin. End date six weeks from last negative culture 08/07/19 - 09/18/19.  #Hypotension: Stable.  Continue midodrine.  #Acute respiratory failure with hypoxia: HD today with UF.  On 2 L oxygen.  #Paroxysmal atrial fibrillation: Cardiology following.  On amiodarone and heparin drip.  #Anemia: hgb 5.9 11/15 in setting of TDC ooze- IR has seen and stitched, Got 1 u PRBCs, better now, Hgb up to 8.2  #Dispo: ? LTACH  Subjective:     Sleeping this AM without complaint.     Objective:   BP (!) 110/50 (BP Location: Left Leg)   Pulse 80   Temp 98.9 F (37.2 C) (Axillary)   Resp (!) 24   Ht 5\' 6"  (1.676 m)   Wt 75.6 kg   SpO2 100%   BMI 26.90 kg/m   Intake/Output Summary (Last 24 hours) at 08/30/2019 1356 Last data filed at 08/29/2019 1630 Gross per 24 hour  Intake -  Output 2330 ml  Net -2330 ml   Weight change: 0.6 kg  Physical Exam: General: sleeping, NAD HEENT: [place] in place Heart:RRR, s1s2 nl, no rubs Lungs: on O2, clear anteriorly Abdomen:soft, Non-tender, non-distended Extremities: Trace edema, LUE edema 2+ Dialysis Access: [facility]  c/d/i  Imaging: No results found.  Labs: BMET Recent Labs  Lab 08/24/19 0413 08/24/19 1119 08/25/19 0017 08/26/19 0404 08/27/19 0320 08/28/19 0258 08/29/19 0312 08/30/19 0422 08/30/19 0832  NA  --  130* 135 135 135 133* 135  --  139  K  --  5.3* 4.2 5.1 4.1 4.9 5.5*  --  4.7  CL  --  91* 96* 94* 96* 94* 93*  --  99  CO2  --  24 25 26 27 25 25   --  28  GLUCOSE  --  157* 133* 131* 146* 142* 142*  --  135*  BUN  --  43* 21 47* 22 37* 55*  --  27*  CREATININE  --  3.13* 1.78* 3.06* 2.11* 3.11* 4.00*  --  2.85*  CALCIUM  --  9.0 8.6* 9.0 8.8* 9.2 9.2  --  9.0  PHOS 4.2  --  2.8 4.2 3.1 4.3 4.0 3.1  --    CBC Recent Labs  Lab 08/27/19 0320 08/28/19 0258 08/28/19 1400 08/29/19 0312 08/30/19 0422  WBC 18.9* 19.2*  --  17.7* 15.7*  HGB 7.1* 5.9* 8.8* 8.2* 7.8*  HCT 22.9* 18.2* 26.4* 24.3* 23.9*  MCV 91.6 89.2  --  85.0 87.9  PLT 427* 426*  --  381 312    Medications:    .  stroke: mapping our early stages of recovery book   Does not apply Once  .  ARIPiprazole  10 mg Oral BID  . atorvastatin  20 mg Per NG tube Daily  . chlorhexidine  15 mL Mouth Rinse BID  . Chlorhexidine Gluconate Cloth  6 each Topical Q0600  . Chlorhexidine Gluconate Cloth  6 each Topical Q0600  . feeding supplement (NEPRO CARB STEADY)  237 mL Oral BID BM  . ferrous sulfate  300 mg Per Tube Q breakfast  . insulin aspart  0-9 Units Subcutaneous Q4H  . mouth rinse  15 mL Mouth Rinse BID  . midodrine  10 mg Per Tube TID WC  . multivitamin  1 tablet Oral QHS  . pantoprazole sodium  40 mg Per Tube Daily  . sodium chloride flush  10-40 mL Intracatheter Q12H  . thiamine injection  100 mg Intravenous Daily  . Thrombi-Pad  1 each Topical Once  . valproic acid  250 mg Per Tube Q6H      Madelon Lips, MD 08/30/2019, 1:56 PM

## 2019-08-30 NOTE — TOC Progression Note (Signed)
Transition of Care [facility]) - Progression Note    Patient Details  Name: [name] MRN: [id number] Date of Birth: [birth year]  [facility]) CM/SW Contact  Graves-Bigelow, Ocie Cornfield, RN Phone Number: 08/30/2019, 11:22 AM  Clinical Narrative: Family agreeable to LTAC- PT recommendations for LTAC-Admissions Coordinator submitted clinical information to insurance. Awaiting response back from insurance. CM will continue to follow for transition of care needs.     Expected Discharge Plan: Long Term Acute Care (LTAC) Barriers to Discharge: Continued Medical Work up  Expected Discharge Plan and Services Expected Discharge Plan: [place] (LTAC) In-house Referral: NA Discharge Planning Services: CM Consult Post Acute Care Choice: [place] Living arrangements for the past 2 months: Single Family Home                   Social Determinants of Health (SDOH) Interventions    Readmission Risk Interventions Readmission Risk Prevention Plan 08/08/2019  Transportation Screening Complete  PCP or Specialist Appt within 3-5 Days Complete  HRI or Home Care Consult Complete  Medication Review (RN Care Manager) Complete  Some recent data might be hidden

## 2019-08-30 NOTE — Progress Notes (Signed)
   Given her recent bleeding episode, perhaps Warfarin would be a better choice for anticoagulation than Eliquis given her HD state.   Discussed with Dr. Tyrell Antonio.   Candee Furbish, MD

## 2019-08-31 ENCOUNTER — Inpatient Hospital Stay (HOSPITAL_COMMUNITY): Payer: 59

## 2019-08-31 DIAGNOSIS — R7881 Bacteremia: Secondary | ICD-10-CM | POA: Diagnosis not present

## 2019-08-31 DIAGNOSIS — B9561 Methicillin susceptible Staphylococcus aureus infection as the cause of diseases classified elsewhere: Secondary | ICD-10-CM | POA: Diagnosis not present

## 2019-08-31 LAB — CBC
HCT: 24.9 % — ABNORMAL LOW (ref 36.0–46.0)
Hemoglobin: 7.8 g/dL — ABNORMAL LOW (ref 12.0–15.0)
MCH: 28.4 pg (ref 26.0–34.0)
MCHC: 31.3 g/dL (ref 30.0–36.0)
MCV: 90.5 fL (ref 80.0–100.0)
Platelets: 356 10*3/uL (ref 150–400)
RBC: 2.75 MIL/uL — ABNORMAL LOW (ref 3.87–5.11)
RDW: 17.2 % — ABNORMAL HIGH (ref 11.5–15.5)
WBC: 19.1 10*3/uL — ABNORMAL HIGH (ref 4.0–10.5)
nRBC: 0 % (ref 0.0–0.2)

## 2019-08-31 LAB — RENAL FUNCTION PANEL
Albumin: 1.4 g/dL — ABNORMAL LOW (ref 3.5–5.0)
Anion gap: 15 (ref 5–15)
BUN: 44 mg/dL — ABNORMAL HIGH (ref 8–23)
CO2: 27 mmol/L (ref 22–32)
Calcium: 9.1 mg/dL (ref 8.9–10.3)
Chloride: 93 mmol/L — ABNORMAL LOW (ref 98–111)
Creatinine, Ser: 4.08 mg/dL — ABNORMAL HIGH (ref 0.44–1.00)
GFR calc Af Amer: 12 mL/min — ABNORMAL LOW (ref >60)
GFR calc non Af Amer: 10 mL/min — ABNORMAL LOW (ref >60)
Glucose, Bld: 138 mg/dL — ABNORMAL HIGH (ref 70–99)
Phosphorus: 3.9 mg/dL (ref 2.5–4.6)
Potassium: 5.2 mmol/L — ABNORMAL HIGH (ref 3.5–5.1)
Sodium: 135 mmol/L (ref 135–145)

## 2019-08-31 LAB — PROTIME-INR
INR: 1.2 (ref 0.8–1.2)
Prothrombin Time: 14.8 seconds (ref 11.4–15.2)

## 2019-08-31 LAB — GLUCOSE, CAPILLARY
Glucose-Capillary: 158 mg/dL — ABNORMAL HIGH (ref 70–99)
Glucose-Capillary: 145 mg/dL — ABNORMAL HIGH (ref 70–99)
Glucose-Capillary: 133 mg/dL — ABNORMAL HIGH (ref 70–99)
Glucose-Capillary: 140 mg/dL — ABNORMAL HIGH (ref 70–99)
Glucose-Capillary: 113 mg/dL — ABNORMAL HIGH (ref 70–99)
Glucose-Capillary: 141 mg/dL — ABNORMAL HIGH (ref 70–99)

## 2019-08-31 LAB — VITAMIN B12: Vitamin B-12: 386 pg/mL (ref 180–914)

## 2019-08-31 LAB — AMMONIA: Ammonia: 22 umol/L (ref 9–35)

## 2019-08-31 LAB — PHOSPHORUS: Phosphorus: 3.8 mg/dL (ref 2.5–4.6)

## 2019-08-31 MED ORDER — CEFAZOLIN SODIUM-DEXTROSE 2-4 GM/100ML-% IV SOLN
2.0000 g | INTRAVENOUS | Status: DC
  Administered 2019-08-31 – 2019-09-02 (×2): 2 g via INTRAVENOUS
  Filled 2019-08-31 (×2): qty 100

## 2019-08-31 MED ORDER — DARBEPOETIN ALFA 100 MCG/0.5ML IJ SOSY
100.0000 ug | PREFILLED_SYRINGE | INTRAMUSCULAR | Status: DC
  Administered 2019-08-31: 14:00:00 100 ug via INTRAVENOUS
  Filled 2019-08-31: qty 0.5

## 2019-08-31 MED ORDER — AMIODARONE HCL 200 MG PO TABS
200.0000 mg | ORAL_TABLET | Freq: Every day | ORAL | Status: DC
  Administered 2019-08-31 – 2019-09-24 (×25): 200 mg via ORAL
  Filled 2019-08-31 (×25): qty 1

## 2019-08-31 MED ORDER — WARFARIN SODIUM 5 MG PO TABS
5.0000 mg | ORAL_TABLET | Freq: Once | ORAL | Status: AC
  Administered 2019-08-31: 5 mg via ORAL
  Filled 2019-08-31: qty 1

## 2019-08-31 MED ORDER — HEPARIN SODIUM (PORCINE) 1000 UNIT/ML IJ SOLN
INTRAMUSCULAR | Status: AC
  Filled 2019-08-31: qty 4

## 2019-08-31 MED ORDER — DULOXETINE HCL 30 MG PO CPEP
30.0000 mg | ORAL_CAPSULE | Freq: Every day | ORAL | Status: DC
  Administered 2019-08-31 – 2019-09-08 (×9): 30 mg via ORAL
  Filled 2019-08-31 (×9): qty 1

## 2019-08-31 MED ORDER — CYANOCOBALAMIN 1000 MCG/ML IJ SOLN
1000.0000 ug | Freq: Once | INTRAMUSCULAR | Status: AC
  Administered 2019-08-31: 1000 ug via INTRAMUSCULAR
  Filled 2019-08-31: qty 1

## 2019-08-31 MED ORDER — MIDODRINE HCL 5 MG PO TABS
ORAL_TABLET | ORAL | Status: AC
  Administered 2019-08-31: 13:00:00 10 mg
  Filled 2019-08-31: qty 2

## 2019-08-31 MED ORDER — DARBEPOETIN ALFA 100 MCG/0.5ML IJ SOSY
PREFILLED_SYRINGE | INTRAMUSCULAR | Status: AC
  Administered 2019-08-31: 100 ug via INTRAVENOUS
  Filled 2019-08-31: qty 0.5

## 2019-08-31 NOTE — Progress Notes (Addendum)
PROGRESS NOTE    Jaclyn Day  [id number] DOB: September 28, 1946 DOA: 08/03/2019 PCP: Cyndi Bender, PA-C   Brief Narrative: 73 year old past medical history significant for congenital unilateral kidney, stage IV chronic kidney disease with baseline creatinine about 2.2--2.8, hypertension, persistent A. fib on Eliquis, chronic diastolic heart failure, history of cardioversion for A. fib admitted to ICU after witnessed cardiac arrest.  On 10/22 patient was admitted after cardiac arrest, intubated, initiation of targeted temperature management 36 Celsius.  Echo showed left ventricular ejection fraction 20 to 25%, global hypokinesis, RV mildly reduced function with normal size. On 10/25: Blood cultures came back positive for staph.  She was a started on vancomycin. Patient was a started on amiodarone drip, she is off of Levophed and neo-.  She was a started on heparin drip, which was subsequently discontinued.  She was also started on hemodialysis. 10/26: Patient was started on Precedex for agitation, femoral CVL was discontinued.    Assessment & Plan:   Principal Problem:   MSSA bacteremia Active Problems:   Cardiac arrest ([place])   Pressure injury of skin   Acute respiratory failure with hypoxia (HCC)   AKI (acute kidney injury) (HCC)   Elevated troponin   Persistent atrial fibrillation (HCC)   Acute on chronic combined systolic and diastolic CHF (congestive heart failure) (HCC)   Acute bacterial endocarditis   Malnutrition of moderate degree   Advanced care planning/counseling discussion   Goals of care, counseling/discussion   Palliative care by specialist   Closed fracture of multiple ribs   Cerebral embolism with cerebral infarction  1-Acute hypoxic respiratory failure due to cardiac arrest and pulmonary edema: Bilateral pleural effusion: Patient presented after outside hospital cardiac arrest Patient was intubated and is status post extubation, on nasal cannula oxygen  Nephrology managing volume status via hemodialysis Patient more tachypnea today, chest x ray stable from before. She gets anxious and hyperventilate. Resume Cymbalta. She will received abilify.   2-Acute metabolic encephalopathy, suspect multifactorial with recent multiple acute CVA -Setting of cardiac arrest, hypothermia, MSSA bacteremia, prolonged hospitalization. CT head 11/11 stable Mental status is slowly improving.  Still sleepy at times. On valproic acid continue with as long standing medication EEG negative for seizure Palliative care was consulted. MRI on 08/19/2019 showed several scattered small acute infarct in the white matter of the superior frontal lobe, left occipital lobe.  Several small subacute chronic infarct in bilateral cerebellum. Neurology help with patient care. TTE no vegetation or thrombus. She is more alert.   3-Hemodialysis catheter side related: 11/15 IR saw the patient, status post 10 cc 1% lidocaine with epinephrine was injected into subcutaneous tract Stable  4-Multiple acute subacute CVA involving white matter: Patient was on heparin drip.  Neurology was okay with anticoagulation.  5-Witnessed cardiac arrest in the setting of chronic A. Fib: Patient treated with hypothermia protocol Left heart cath initially deferred due to acute on chronic renal failure Ejection fraction initially down to 20 to 25% on 10/20-second/2020, improved to 55 to 60% on echo 11/90/2020. Follow cardiology recommendation in regard ischemic evaluation  6-A. fib with RVR: Cardiology was following. Follow-up with cardiology for further evaluation and consideration of TEE and DCCV Heparin on hold due to anemia. On coumadin, no bridge due to concern for bleeding.  Amiodarone change to oral.  No plan for inpatient cardioversion per cardiology.   7-Acute on chronic systolic heart failure: TTE on 10/22 with EF 20%.  Repeat TEE with improved ejection fraction to 60%.  Volume  management  with hemodialysis.  8-MSSA bacteremia: Repeat blood cultures negative. ID recommended cefazolin until 12/7 TTE no evidence of endocarditis or thrombus.  9-Acute blood loss anemia: No sign of overt bleeding, she had bled from hemodialysis catheter on 11/15. Status post transfuse 1 unit of packed red blood cell. Hs has remain 8 range,  10-AKI on chronic kidney disease a stage IV: Suspect AKI likely ATN in the setting of shock, cardiac arrest bacteremia. She was initially started on CRRT 10/30---11/1 Currently getting hemodialysis. Draining well for possible aVF AV graft  11-Hyperkalemia: Resolved  12-Nutrition: On tube feeding via core track tube He was a started on dysphagia 1 diet. He was transitioned to nocturnal feeding Osmolite. Monitor oral intake to determine removal of tube feeding  13-oral thrush; start nystatin. 14-Leukocytosis;  On IV antibiotics. Chest x ray stable. Follow trend. .   Nutrition Problem: Moderate Malnutrition Etiology: acute illness(acute respiratory failure, AKI required CRRT, endocarditis)    Signs/Symptoms: mild fat depletion, mild muscle depletion, moderate muscle depletion    Interventions: Tube feeding  Estimated body mass index is 26.76 kg/m as calculated from the following:   Height as of this encounter: 5\' 6"  (1.676 m).   Weight as of this encounter: 75.2 kg.   DVT prophylaxis: Heparin Code Status: Full code Family Communication: [name] at bedside.  Disposition Plan: Remain in the hospital for amiodarone infusion, antibiotics, hemodialysis. Peer to peer reviewed today for LTAC was decline.  Consultants:  Nephrology IR Neurology Palliative care Cardiology  Procedures:  placement of tunneled HD catheter, CRRT, HD  Antimicrobials:  Rocephin 10/22 >> 10/25 Vanc (staph in blood ) 10/24  Cefazolin 10/25 >>  Subjective: Alert, asking for water. She is anxious and tachypnea.  Objective: Vitals:   08/31/19 1205  08/31/19 1212 08/31/19 1213 08/31/19 1230  BP: 111/64 126/70 127/64 120/61  Pulse: (!) 111 96 98 76  Resp: (!) 28 (!) 24    Temp: 98.5 F (36.9 C)     TempSrc: Oral     SpO2: 98% 100% 99%   Weight: 75.2 kg     Height:        Intake/Output Summary (Last 24 hours) at 08/31/2019 1324 Last data filed at 08/31/2019 0835 Gross per 24 hour  Intake 110 ml  Output -  Net 110 ml   Filed Weights   08/29/19 1630 08/31/19 0516 08/31/19 1205  Weight: 75.6 kg 75.2 kg 75.2 kg    Examination:  General exam: Anxious Respiratory system: crackles bases Cardiovascular system: S 1, S 2 RRR Gastrointestinal system: BS present, soft, nt Central nervous system: alert, answer questions Extremities:  No edema Skin: No rashes     Data Reviewed: I have personally reviewed following labs and imaging studies  CBC: Recent Labs  Lab 08/27/19 0320 08/28/19 0258 08/28/19 1400 08/29/19 0312 08/30/19 0422 08/31/19 0403  WBC 18.9* 19.2*  --  17.7* 15.7* 19.1*  HGB 7.1* 5.9* 8.8* 8.2* 7.8* 7.8*  HCT 22.9* 18.2* 26.4* 24.3* 23.9* 24.9*  MCV 91.6 89.2  --  85.0 87.9 90.5  PLT 427* 426*  --  381 312 [id number]   Basic Metabolic Panel: Recent Labs  Lab 08/26/19 0404 08/27/19 0320 08/28/19 0258 08/29/19 0312 08/30/19 0422 08/30/19 0832 08/31/19 0403  NA 135 135 133* 135  --  139  --   K 5.1 4.1 4.9 5.5*  --  4.7  --   CL 94* 96* 94* 93*  --  99  --   CO2 26 27 25 25   --  28  --   GLUCOSE 131* 146* 142* 142*  --  135*  --   BUN 47* 22 37* 55*  --  27*  --   CREATININE 3.06* 2.11* 3.11* 4.00*  --  2.85*  --   CALCIUM 9.0 8.8* 9.2 9.2  --  9.0  --   PHOS 4.2 3.1 4.3 4.0 3.1  --  3.8   GFR: Estimated Creatinine Clearance: 18.5 mL/min (A) (by C-G formula based on SCr of 2.85 mg/dL (H)). Liver Function Tests: No results for input(s): AST, ALT, ALKPHOS, BILITOT, PROT, ALBUMIN in the last 168 hours. No results for input(s): LIPASE, AMYLASE in the last 168 hours. Recent Labs  Lab 08/31/19 0403   AMMONIA 22   Coagulation Profile: Recent Labs  Lab 08/31/19 0403  INR 1.2   Cardiac Enzymes: No results for input(s): CKTOTAL, CKMB, CKMBINDEX, TROPONINI in the last 168 hours. BNP (last 3 results) No results for input(s): PROBNP in the last 8760 hours. HbA1C: No results for input(s): HGBA1C in the last 72 hours. CBG: Recent Labs  Lab 08/30/19 2039 08/31/19 0101 08/31/19 0426 08/31/19 0745 08/31/19 1132  GLUCAP 133* 158* 145* 133* 140*   Lipid Profile: No results for input(s): CHOL, HDL, LDLCALC, TRIG, CHOLHDL, LDLDIRECT in the last 72 hours. Thyroid Function Tests: No results for input(s): TSH, T4TOTAL, FREET4, T3FREE, THYROIDAB in the last 72 hours. Anemia Panel: Recent Labs    08/31/19 0403  VITAMINB12 386   Sepsis Labs: No results for input(s): PROCALCITON, LATICACIDVEN in the last 168 hours.  Recent Results (from the past 240 hour(s))  Culture, blood (routine x 2)     Status: None (Preliminary result)   Collection Time: 08/28/19 12:00 PM   Specimen: BLOOD  Result Value Ref Range Status   Specimen Description BLOOD BLOOD LEFT HAND  Final   Special Requests   Final    AEROBIC BOTTLE ONLY Blood Culture results may not be optimal due to an inadequate volume of blood received in culture bottles   Culture   Final    NO GROWTH 3 DAYS Performed at [facility], [place] [address], [place], [place] [address]    Report Status PENDING  Incomplete  Culture, blood (routine x 2)     Status: None (Preliminary result)   Collection Time: 08/28/19 12:00 PM   Specimen: BLOOD  Result Value Ref Range Status   Specimen Description BLOOD LEFT ANTECUBITAL  Final   Special Requests   Final    AEROBIC BOTTLE ONLY Blood Culture results may not be optimal due to an inadequate volume of blood received in culture bottles   Culture   Final    NO GROWTH 3 DAYS Performed at [facility], [place] [address] [place], [place] [address]    Report Status PENDING  Incomplete          Radiology Studies: Dg Chest Port 1 View  Result Date: 08/31/2019 CLINICAL DATA:  73 year old female with leukocytosis. EXAM: PORTABLE CHEST 1 VIEW COMPARISON:  Chest radiograph dated 08/21/2019. FINDINGS: Right-sided dialysis catheter with tip in similar position. Feeding tube extends below the diaphragm with tip beyond the inferior margin of the image. No significant interval change in the bilateral pleural effusions, right greater left and associated bilateral lower lobe atelectasis versus infiltrate. No pneumothorax. Stable cardiac silhouette. Atherosclerotic calcification of the aorta. Multiple mildly displaced left rib fractures. IMPRESSION: 1. No significant interval change in the bilateral pleural effusions and associated bilateral lower lobe atelectasis versus  infiltrate. 2. Multiple mildly displaced left rib fractures. No identifiable pneumothorax. Electronically Signed   By: Anner Crete M.D.   On: 08/31/2019 09:11        Scheduled Meds: .  stroke: mapping our early stages of recovery book   Does not apply Once  . amiodarone  200 mg Oral Daily  . ARIPiprazole  10 mg Oral BID  . atorvastatin  20 mg Per NG tube Daily  . chlorhexidine  15 mL Mouth Rinse BID  . Chlorhexidine Gluconate Cloth  6 each Topical Q0600  . Chlorhexidine Gluconate Cloth  6 each Topical Q0600  . cyanocobalamin  1,000 mcg Intramuscular Once  . darbepoetin (ARANESP) injection - DIALYSIS  100 mcg Intravenous Q Thu-HD  . DULoxetine  30 mg Oral Daily  . feeding supplement (NEPRO CARB STEADY)  237 mL Oral BID BM  . ferrous sulfate  300 mg Per Tube Q breakfast  . insulin aspart  0-9 Units Subcutaneous Q4H  . mouth rinse  15 mL Mouth Rinse BID  . midodrine  10 mg Per Tube TID WC  . multivitamin  1 tablet Oral QHS  . nystatin  5 mL Oral QID  . pantoprazole sodium  40 mg Per Tube Daily  . sodium chloride flush  10-40 mL Intracatheter Q12H  . thiamine injection  100 mg Intravenous Daily  . Thrombi-Pad  1  each Topical Once  . valproic acid  250 mg Per Tube Q6H  . warfarin  5 mg Oral ONCE-1800  . Warfarin - Pharmacist Dosing Inpatient   Does not apply q1800   Continuous Infusions: . sodium chloride 10 mL (08/20/19 2025)  . sodium chloride    . sodium chloride    .  ceFAZolin (ANCEF) IV Stopped (08/26/19 1151)  . feeding supplement (OSMOLITE 1.5 CAL) 1,000 mL (08/30/19 2011)     LOS: 28 days    Time spent: 35 minutes.     Elmarie Shiley, MD [facility]  If 7PM-7AM, please contact night-coverage [email or web address] Password [email or web address] 08/31/2019, 1:24 PM

## 2019-08-31 NOTE — Progress Notes (Signed)
[facility] Progress Note    Assessment/ Plan:   #dialysis dep't AoCKD4: CKD due to hypertension.  AKI likely ATN in the setting of shock, cardiac arrest and bacteremia.  She was on CRRT 10/30-11/1.   -Kidney ultrasound showed solitary right kidney with cortical thinning and cysts. -Seen by palliative, full scope of care, potentially to LTACH?  - s/p TDC with IR 11/11 - Cont iHD THS Schedule:  Next HD today 11/19  - cont to eval for possible AVF or AVG, too debilitated currently  #Hyperkalemia: Cont HD  #Acute encephalopathy: somewhat improved but still present.  11/7 MRI with scattered small acute infarcts and chronic infarcts noted.  #Cardiac arrest, acute on chronic systolic CHF: EF [id number] by echo, cardiology is following.  EF improved in TEE.  #MSSA bacteremia: Continue cefazolin. End date six weeks from last negative culture 08/07/19 - 09/18/19.  #Hypotension: Stable.  Continue midodrine.  #Acute respiratory failure with hypoxia: HD today with UF.  On 2 L oxygen.  #Paroxysmal atrial fibrillation: Cardiology following.  On amiodarone and heparin drip.  #Anemia: hgb 5.9 11/15 in setting of TDC ooze- IR has seen and stitched, Got 1 u PRBCs, better now, Hgb up to 8.2  #Dispo: ? LTACH  Subjective:    Seen in room.  Awake and watching TV today.     Objective:   BP (!) 156/69 (BP Location: Left Arm)   Pulse 99   Temp 99.2 F (37.3 C) (Oral)   Resp (!) 24   Ht 5\' 6"  (1.676 m)   Wt 75.2 kg   SpO2 97%   BMI 26.76 kg/m   Intake/Output Summary (Last 24 hours) at 08/31/2019 1151 Last data filed at 08/31/2019 [id number] Gross per 24 hour  Intake 1336.86 ml  Output -  Net 1336.86 ml   Weight change: -3.1 kg  Physical Exam: General: NAD, awake HEENT: [place] in place Heart:RRR, s1s2 nl, no rubs Lungs: on O2, clear anteriorly Abdomen:soft, Non-tender, non-distended Extremities: Trace edema, LUE edema 2+ Dialysis Access: [facility] c/d/i  Imaging: Dg Chest  Port 1 View  Result Date: 08/31/2019 CLINICAL DATA:  73 year old female with leukocytosis. EXAM: PORTABLE CHEST 1 VIEW COMPARISON:  Chest radiograph dated 08/21/2019. FINDINGS: Right-sided dialysis catheter with tip in similar position. Feeding tube extends below the diaphragm with tip beyond the inferior margin of the image. No significant interval change in the bilateral pleural effusions, right greater left and associated bilateral lower lobe atelectasis versus infiltrate. No pneumothorax. Stable cardiac silhouette. Atherosclerotic calcification of the aorta. Multiple mildly displaced left rib fractures. IMPRESSION: 1. No significant interval change in the bilateral pleural effusions and associated bilateral lower lobe atelectasis versus infiltrate. 2. Multiple mildly displaced left rib fractures. No identifiable pneumothorax. Electronically Signed   By: Anner Crete M.D.   On: 08/31/2019 09:11    Labs: BMET Recent Labs  Lab 08/25/19 0017 08/26/19 0404 08/27/19 0320 08/28/19 0258 08/29/19 0312 08/30/19 0422 08/30/19 0832 08/31/19 0403  NA 135 135 135 133* 135  --  139  --   K 4.2 5.1 4.1 4.9 5.5*  --  4.7  --   CL 96* 94* 96* 94* 93*  --  99  --   CO2 25 26 27 25 25   --  28  --   GLUCOSE 133* 131* 146* 142* 142*  --  135*  --   BUN 21 47* 22 37* 55*  --  27*  --   CREATININE 1.78* 3.06* 2.11* 3.11* 4.00*  --  2.85*  --   CALCIUM 8.6* 9.0 8.8* 9.2 9.2  --  9.0  --   PHOS 2.8 4.2 3.1 4.3 4.0 3.1  --  3.8   CBC Recent Labs  Lab 08/28/19 0258 08/28/19 1400 08/29/19 0312 08/30/19 0422 08/31/19 0403  WBC 19.2*  --  17.7* 15.7* 19.1*  HGB 5.9* 8.8* 8.2* 7.8* 7.8*  HCT 18.2* 26.4* 24.3* 23.9* 24.9*  MCV 89.2  --  85.0 87.9 90.5  PLT 426*  --  381 312 356    Medications:    .  stroke: mapping our early stages of recovery book   Does not apply Once  . ARIPiprazole  10 mg Oral BID  . atorvastatin  20 mg Per NG tube Daily  . chlorhexidine  15 mL Mouth Rinse BID  .  Chlorhexidine Gluconate Cloth  6 each Topical Q0600  . Chlorhexidine Gluconate Cloth  6 each Topical Q0600  . cyanocobalamin  1,000 mcg Intramuscular Once  . darbepoetin (ARANESP) injection - DIALYSIS  100 mcg Intravenous Q Thu-HD  . DULoxetine  30 mg Oral Daily  . feeding supplement (NEPRO CARB STEADY)  237 mL Oral BID BM  . ferrous sulfate  300 mg Per Tube Q breakfast  . insulin aspart  0-9 Units Subcutaneous Q4H  . mouth rinse  15 mL Mouth Rinse BID  . midodrine  10 mg Per Tube TID WC  . multivitamin  1 tablet Oral QHS  . nystatin  5 mL Oral QID  . pantoprazole sodium  40 mg Per Tube Daily  . sodium chloride flush  10-40 mL Intracatheter Q12H  . thiamine injection  100 mg Intravenous Daily  . Thrombi-Pad  1 each Topical Once  . valproic acid  250 mg Per Tube Q6H  . warfarin  5 mg Oral ONCE-1800  . Warfarin - Pharmacist Dosing Inpatient   Does not apply q1800      Madelon Lips, MD 08/31/2019, 11:51 AM

## 2019-08-31 NOTE — TOC Progression Note (Signed)
Transition of Care [facility]) - Progression Note    Patient Details  Name: Jaclyn Day MRN: [id number] Date of Birth: 1946/06/15  Transition of Care [facility]) CM/SW Contact  Graves-Bigelow, Ocie Cornfield, RN Phone Number: 08/31/2019, 3:57 PM  Clinical Narrative:   Patient denied LTAC- P2P completed- denied, expedited appeal faxed on 08-31-19. Awaiting determination. CM will continue to follow for transition of care needs.     Expected Discharge Plan: Long Term Acute Care (LTAC) Barriers to Discharge: Continued Medical Work up  Expected Discharge Plan and Services Expected Discharge Plan: [place] (LTAC) In-house Referral: NA Discharge Planning Services: CM Consult Post Acute Care Choice: [place] Living arrangements for the past 2 months: Single Family Home                    Social Determinants of Health (SDOH) Interventions    Readmission Risk Interventions Readmission Risk Prevention Plan 08/08/2019  Transportation Screening Complete  PCP or Specialist Appt within 3-5 Days Complete  HRI or Home Care Consult Complete  Medication Review (RN Care Manager) Complete  Some recent data might be hidden

## 2019-08-31 NOTE — Progress Notes (Signed)
ANTICOAGULATION CONSULT NOTE - Follow-Up Consult  Pharmacy Consult for Warfarin Indication: atrial fibrillation  Allergies  Allergen Reactions  . Codeine Other (See Comments)    Reaction not recalled by family- was told to never take this    Patient Measurements: Height: 5\' 6"  (167.6 cm) Weight: 165 lb 12.6 oz (75.2 kg) IBW/kg (Calculated) : 59.3  Vital Signs: Temp: 99.2 F (37.3 C) (11/19 0834) Temp Source: Oral (11/19 0834) BP: 156/69 (11/19 0834) Pulse Rate: 99 (11/19 0428)  Labs: Recent Labs    08/29/19 0312 08/30/19 0422 08/30/19 0832 08/31/19 0403  HGB 8.2* 7.8*  --  7.8*  HCT 24.3* 23.9*  --  24.9*  PLT 381 312  --  356  LABPROT  --   --   --  14.8  INR  --   --   --  1.2  CREATININE 4.00*  --  2.85*  --     Estimated Creatinine Clearance: 18.5 mL/min (A) (by C-G formula based on SCr of 2.85 mg/dL (H)).   Medical History: Past Medical History:  Diagnosis Date  . Anemia   . Chronic diastolic CHF (congestive heart failure) ([place])   . CKD (chronic kidney disease), stage IV ([place])   . Hypertension   . Persistent atrial fibrillation ([place])    a. dx 01/2019 s/p TEE DCCV.  Marland Kitchen Renal disorder   . Unilateral congenital absence of kidney    Assessment: Pt is a 73 yr old female admitted S/P out of hospital cardiac arrest. ROSC was achieved and targeted temperature management was initiated. This admission has been complicated by continued encephalopathy, AKI on CKD which has advanced to dialysis, and hypotension. MRI on 11/7 found several small acute infarcts (possibly embolic in nature), though no hemorrhage. Heparin started but held since 11/17 due to bleeding and anemia. Warfarin initiated 11/18, INR subtherapeutic as expected at 1.2. No bridge planned per cardiology with anemia and recent bleeding.    Goal of Therapy:  INR 2-3 Monitor platelets by anticoagulation protocol: Yes   Plan:  -Warfarin 5mg  PO again tonight -Daily protime   Arrie Senate, PharmD,  BCPS Clinical Pharmacist [phone] Please check AMION for all [name] numbers 08/31/2019

## 2019-08-31 NOTE — Progress Notes (Addendum)
Progress Note  Patient Name: Jaclyn Day Date of Encounter: 08/31/2019  Primary Cardiologist: Ena Dawley, MD   Subjective   Patient says she is feeling a little SOB on 2L O2. HD today. No CP  Inpatient Medications    Scheduled Meds:   stroke: mapping our early stages of recovery book   Does not apply Once   ARIPiprazole  10 mg Oral BID   atorvastatin  20 mg Per NG tube Daily   chlorhexidine  15 mL Mouth Rinse BID   Chlorhexidine Gluconate Cloth  6 each Topical Q0600   Chlorhexidine Gluconate Cloth  6 each Topical Q0600   cyanocobalamin  1,000 mcg Intramuscular Once   feeding supplement (NEPRO CARB STEADY)  237 mL Oral BID BM   ferrous sulfate  300 mg Per Tube Q breakfast   insulin aspart  0-9 Units Subcutaneous Q4H   mouth rinse  15 mL Mouth Rinse BID   midodrine  10 mg Per Tube TID WC   multivitamin  1 tablet Oral QHS   nystatin  5 mL Oral QID   pantoprazole sodium  40 mg Per Tube Daily   sodium chloride flush  10-40 mL Intracatheter Q12H   thiamine injection  100 mg Intravenous Daily   Thrombi-Pad  1 each Topical Once   valproic acid  250 mg Per Tube Q6H   Warfarin - Pharmacist Dosing Inpatient   Does not apply q1800   Continuous Infusions:  sodium chloride 10 mL (08/20/19 2025)   sodium chloride     sodium chloride     amiodarone 30 mg/hr (08/31/19 0815)    ceFAZolin (ANCEF) IV Stopped (08/26/19 1151)   feeding supplement (OSMOLITE 1.5 CAL) 1,000 mL (08/30/19 2011)   PRN Meds: sodium chloride, sodium chloride, acetaminophen (TYLENOL) oral liquid 160 mg/5 mL, alteplase, heparin, hydrALAZINE, HYDROmorphone (DILAUDID) injection, levalbuterol, lidocaine (PF), lidocaine-prilocaine, pentafluoroprop-tetrafluoroeth, sodium chloride flush   Vital Signs    Vitals:   08/31/19 0428 08/31/19 0516 08/31/19 0834 08/31/19 0844  BP: 122/81  (!) 156/69   Pulse: 99     Resp: (!) 24     Temp: 99.9 F (37.7 C)  99.2 F (37.3 C)   TempSrc:  Axillary  Oral   SpO2: 99%   97%  Weight:  75.2 kg    Height:        Intake/Output Summary (Last 24 hours) at 08/31/2019 0905 Last data filed at 08/31/2019 0835 Gross per 24 hour  Intake 1336.86 ml  Output --  Net 1336.86 ml   Last 3 Weights 08/31/2019 08/29/2019 08/29/2019  Weight (lbs) 165 lb 12.6 oz 166 lb 10.7 oz 172 lb 9.9 oz  Weight (kg) 75.2 kg 75.6 kg 78.3 kg      Telemetry    Afib; HR 100-110, peak 120s - Personally Reviewed  ECG    No new - Personally Reviewed  Physical Exam   GEN: No acute distress.   Neck: No JVD Cardiac: Irreg Irreg, no murmurs, rubs, or gallops.  Respiratory: Clear to auscultation bilaterally; 2L O2 GI: Soft, nontender, non-distended  MS: No edema; No deformity. Neuro:  Nonfocal  Psych: Normal affect   Labs    High Sensitivity Troponin:   Recent Labs  Lab 08/03/19 1013 08/03/19 1147 08/03/19 1550 08/04/19 0934  TROPONINIHS 48* 715* 2,793* 3,543*      Chemistry Recent Labs  Lab 08/28/19 0258 08/29/19 0312 08/30/19 0832  NA 133* 135 139  K 4.9 5.5* 4.7  CL 94* 93* 99  CO2 25 25 28   GLUCOSE 142* 142* 135*  BUN 37* 55* 27*  CREATININE 3.11* 4.00* 2.85*  CALCIUM 9.2 9.2 9.0  GFRNONAA 14* 11* 16*  GFRAA 17* 12* 18*  ANIONGAP 14 17* 12     Hematology Recent Labs  Lab 08/29/19 0312 08/30/19 0422 08/31/19 0403  WBC 17.7* 15.7* 19.1*  RBC 2.86* 2.72* 2.75*  HGB 8.2* 7.8* 7.8*  HCT 24.3* 23.9* 24.9*  MCV 85.0 87.9 90.5  MCH 28.7 28.7 28.4  MCHC 33.7 32.6 31.3  RDW 17.3* 17.3* 17.2*  PLT 381 312 356    BNPNo results for input(s): BNP, PROBNP in the last 168 hours.   DDimer No results for input(s): DDIMER in the last 168 hours.   Radiology    No results found.  Cardiac Studies   Echo 08/21/19 IMPRESSIONS  1. Left ventricular ejection fraction, by visual estimation, is 55 to 60%. The left ventricle has normal function. There is moderately increased left ventricular hypertrophy. 2. Left ventricular  diastolic parameters are indeterminate. 3. Global right ventricle has normal systolic function.The right ventricular size is normal. No increase in right ventricular wall thickness. 4. Left atrial size was moderately dilated. 5. Right atrial size was mildly dilated. 6. Trivial pericardial effusion is present. 7. The mitral valve is normal in structure. Trace mitral valve regurgitation. 8. The tricuspid valve is normal in structure. Tricuspid valve regurgitation is mild. 9. The aortic valve is tricuspid. Aortic valve regurgitation is not visualized. 10. The pulmonic valve was not well visualized. Pulmonic valve regurgitation is not visualized. 11. The tricuspid regurgitant velocity is 2.79 m/s, and with an assumed right atrial pressure of 8 mmHg, the estimated right ventricular systolic pressure is mildly elevated at 39.1 mmHg. 12. The inferior vena cava is normal in size with <50% respiratory variability, suggesting right atrial pressure of 8 mmHg. 13. The interatrial septum was not well visualized.  Patient Profile     73 y.o. female with a pmh of one kidney, CKD stage 5, HTN, chronic afib on Eliquis, Chronic diastolic HF who was admitted to ICU after witness cardiac arrest requiring intubation. Was found to be in afib RVR placed on amio and IV heparin. Was subsequently extubated. Found to have MSSA bacteremia and multiple acute infarcts. Cardiology previously following. Now noted to be Afib RVR and cardiology once again seeing patient.  Assessment & Plan    Atrial fibrillation patient with intermittent Afib RVR this admission.Started on an amiodarone gtt and transitioned to po after conversion to sinus rhythm, however back in Afib with RVR 08/24/2019 and was restarted on amiodarone gtt. -Patient is still in afib.HR currently 100-110 -Continue amiodarone gtt for rate/rhythm control - heparin transitioned to coumadin overnight - Possible TEE/DCCV if patient can be anticoagulated.    Post-arrest patient presented with out of hospital cardiac arrest, s/p cooling protocol.LHC initially deferred due tokidney functionin an attempt to avoid HD.  -EF initially down to 20-25% 08/03/2019, improved to 55-60% on echo 08/21/2019.   Acute on chronic combined CHF: -initial echo with EF 20-25% 08/03/2019 following out of hospital cardiac arrest.  -Repeat echo with normalization of EF to 55-60% 08/21/2019. -She was subsequently started on HD for volume management.HD today -Continue volume management per HD  Metabolic encephalopathy: -likely multifactorial in the setting of post-arrest, MSSA bacteremia, acute CVA, and hospital delirium. -Mental statusslowly improving. EEG negative.  -Continue management per primary team  MSSA bacteremia -?vegetation on TEE. -ID following for management of ID antibiotics -Continue antibiotics per ID/primary  team  CKD stage IV -started onintermittentHD this admission s/p TDC.  - creatinine continues to worsen.  - Continue management per Nephrology  Anemia -s/p2uPRBC this admission. - Hgb 7.8   For questions or updates, please contact [place] Please consult [email or web address] for contact info under        Signed, Cadence Ninfa Meeker, PA-C  08/31/2019, 9:05 AM    Personally seen and examined. Agree with above.   Still feeling weak but improved  Irregularly irregular, fatigue  Paroxysmal atrial fibrillation -Started Coumadin per pharmacy.  I am not sure that Eliquis would have been the right medication for her given her recent anemia and bleeding episode. -- will go ahead and change her to PO/NG amiodarone 200 QD. Stop IV drip.  End-stage renal disease -Per nephrology  Endocarditis -ID antibiotics  Anemia -2 unit of blood after hemoglobin was in the 5 range.  This was supposedly corrected after further suturing of dialysis catheter.  Hemoglobin currently 7.8 fairly stable  Candee Furbish, MD

## 2019-09-01 DIAGNOSIS — B9561 Methicillin susceptible Staphylococcus aureus infection as the cause of diseases classified elsewhere: Secondary | ICD-10-CM | POA: Diagnosis not present

## 2019-09-01 DIAGNOSIS — R7881 Bacteremia: Secondary | ICD-10-CM | POA: Diagnosis not present

## 2019-09-01 LAB — RENAL FUNCTION PANEL
Albumin: 1.4 g/dL — ABNORMAL LOW (ref 3.5–5.0)
Anion gap: 13 (ref 5–15)
BUN: 23 mg/dL (ref 8–23)
CO2: 27 mmol/L (ref 22–32)
Calcium: 8.9 mg/dL (ref 8.9–10.3)
Chloride: 96 mmol/L — ABNORMAL LOW (ref 98–111)
Creatinine, Ser: 2.42 mg/dL — ABNORMAL HIGH (ref 0.44–1.00)
GFR calc Af Amer: 22 mL/min — ABNORMAL LOW (ref >60)
GFR calc non Af Amer: 19 mL/min — ABNORMAL LOW (ref >60)
Glucose, Bld: 133 mg/dL — ABNORMAL HIGH (ref 70–99)
Phosphorus: 3.3 mg/dL (ref 2.5–4.6)
Potassium: 4.1 mmol/L (ref 3.5–5.1)
Sodium: 136 mmol/L (ref 135–145)

## 2019-09-01 LAB — CBC
HCT: 23.8 % — ABNORMAL LOW (ref 36.0–46.0)
Hemoglobin: 7.3 g/dL — ABNORMAL LOW (ref 12.0–15.0)
MCH: 28.3 pg (ref 26.0–34.0)
MCHC: 30.7 g/dL (ref 30.0–36.0)
MCV: 92.2 fL (ref 80.0–100.0)
Platelets: 301 10*3/uL (ref 150–400)
RBC: 2.58 MIL/uL — ABNORMAL LOW (ref 3.87–5.11)
RDW: 16.9 % — ABNORMAL HIGH (ref 11.5–15.5)
WBC: 19.1 10*3/uL — ABNORMAL HIGH (ref 4.0–10.5)
nRBC: 0 % (ref 0.0–0.2)

## 2019-09-01 LAB — GLUCOSE, CAPILLARY
Glucose-Capillary: 126 mg/dL — ABNORMAL HIGH (ref 70–99)
Glucose-Capillary: 132 mg/dL — ABNORMAL HIGH (ref 70–99)
Glucose-Capillary: 141 mg/dL — ABNORMAL HIGH (ref 70–99)
Glucose-Capillary: 155 mg/dL — ABNORMAL HIGH (ref 70–99)
Glucose-Capillary: 190 mg/dL — ABNORMAL HIGH (ref 70–99)
Glucose-Capillary: 96 mg/dL (ref 70–99)

## 2019-09-01 LAB — PROTIME-INR
INR: 1.8 — ABNORMAL HIGH (ref 0.8–1.2)
Prothrombin Time: 20.6 seconds — ABNORMAL HIGH (ref 11.4–15.2)

## 2019-09-01 MED ORDER — WARFARIN SODIUM 2.5 MG PO TABS
2.5000 mg | ORAL_TABLET | Freq: Once | ORAL | Status: AC
  Administered 2019-09-01: 2.5 mg via ORAL
  Filled 2019-09-01: qty 1

## 2019-09-01 NOTE — Progress Notes (Signed)
PROGRESS NOTE    Jaclyn Day  [id number] DOB: May 07, 1946 DOA: 08/03/2019 PCP: Cyndi Bender, PA-C   Brief Narrative: 73 year old past medical history significant for congenital unilateral kidney, stage IV chronic kidney disease with baseline creatinine about 2.2--2.8, hypertension, persistent A. fib on Eliquis, chronic diastolic heart failure, history of cardioversion for A. fib admitted to ICU after witnessed cardiac arrest.  On 10/22 patient was admitted after cardiac arrest, intubated, initiation of targeted temperature management 36 Celsius.  Echo showed left ventricular ejection fraction 20 to 25%, global hypokinesis, RV mildly reduced function with normal size. On 10/25: Blood cultures came back positive for staph.  She was a started on vancomycin. Patient was a started on amiodarone drip, she is off of Levophed and neo-.  She was a started on heparin drip, which was subsequently discontinued.  She was also started on hemodialysis. 10/26: Patient was started on Precedex for agitation, femoral CVL was discontinued.    Assessment & Plan:   Principal Problem:   MSSA bacteremia Active Problems:   Cardiac arrest ([place])   Pressure injury of skin   Acute respiratory failure with hypoxia (HCC)   AKI (acute kidney injury) (HCC)   Elevated troponin   Persistent atrial fibrillation (HCC)   Acute on chronic combined systolic and diastolic CHF (congestive heart failure) (HCC)   Acute bacterial endocarditis   Malnutrition of moderate degree   Advanced care planning/counseling discussion   Goals of care, counseling/discussion   Palliative care by specialist   Closed fracture of multiple ribs   Cerebral embolism with cerebral infarction  1-Acute hypoxic respiratory failure due to cardiac arrest and pulmonary edema: Bilateral pleural effusion: Patient presented after outside hospital cardiac arrest Patient was intubated and is status post extubation, on nasal cannula oxygen  Nephrology managing volume status via hemodialysis Patient more tachypnea 11/19, chest x ray stable from before. She gets anxious and hyperventilate.  She is more calm today.    2-Acute metabolic encephalopathy, suspect multifactorial with recent multiple acute CVA -Setting of cardiac arrest, hypothermia, MSSA bacteremia, prolonged hospitalization. CT head 11/11 stable Mental status is slowly improving. Sleepy at times.  On valproic acid continue with as long standing medication EEG negative for seizure Palliative care was consulted. MRI on 08/19/2019 showed several scattered small acute infarct in the white matter of the superior frontal lobe, left occipital lobe.  Several small subacute chronic infarct in bilateral cerebellum. Neurology help with patient care. TTE no vegetation or thrombus. MS fluctuates.   3-Hemodialysis catheter side related: 11/15 IR saw the patient, status post 10 cc 1% lidocaine with epinephrine was injected into subcutaneous tract Stable  4-Multiple acute subacute CVA involving white matter: Patient was on heparin drip.  Neurology was okay with anticoagulation. On coumadin.   5-Witnessed cardiac arrest in the setting of chronic A. Fib: Patient treated with hypothermia protocol Left heart cath initially deferred due to acute on chronic renal failure Ejection fraction initially down to 20 to 25% on 10/20-second/2020, improved to 55 to 60% on echo 11/72/2020. Follow cardiology recommendation in regard ischemic evaluation  6-A. fib with RVR: Cardiology was following. Follow-up with cardiology for further evaluation and consideration of TEE and DCCV Heparin on hold due to anemia. On coumadin, no bridge due to concern for bleeding.  Amiodarone change to oral.  No plan for inpatient cardioversion per cardiology.   7-Acute on chronic systolic heart failure: TTE on 10/22 with EF 20%.  Repeat TEE with improved ejection fraction to 60%.  Volume management  with  hemodialysis.  8-MSSA bacteremia: Repeat blood cultures negative. ID recommended cefazolin until 12/7 TTE no evidence of endocarditis or thrombus.  9-Acute blood loss anemia: No sign of overt bleeding, she had bled from hemodialysis catheter on 11/15. Status post transfuse 1 unit of packed red blood cell. Hs has remain 8 range,  10-AKI on chronic kidney disease a stage IV: Suspect AKI likely ATN in the setting of shock, cardiac arrest bacteremia. She was initially started on CRRT 10/30---11/1 Currently getting hemodialysis. Too weak to undergoing AVF or  AV graft  11-Hyperkalemia: Resolved  12-Nutrition: On tube feeding via core track tube He was a started on dysphagia 1 diet. He was transitioned to nocturnal feeding Osmolite. Monitor oral intake to determine removal of tube feeding Calory counts.   13-oral thrush; started nystatin. 14-Leukocytosis;  On IV antibiotics. Chest x ray stable. Follow trend. .  Will check UA, urine culture, repeat blood culture.   Nutrition Problem: Moderate Malnutrition Etiology: acute illness(acute respiratory failure, AKI required CRRT, endocarditis)    Signs/Symptoms: mild fat depletion, mild muscle depletion, moderate muscle depletion    Interventions: Tube feeding  Estimated body mass index is 26.55 kg/m as calculated from the following:   Height as of this encounter: 5\' 6"  (1.676 m).   Weight as of this encounter: 74.6 kg.   DVT prophylaxis: Heparin Code Status: Full code Family Communication: [name] at bedside.  Disposition Plan: Remain in the hospital for amiodarone infusion, antibiotics, hemodialysis. Peer to peer reviewed today for LTAC was decline.  Consultants:  Nephrology IR Neurology Palliative care Cardiology  Procedures:  placement of tunneled HD catheter, CRRT, HD  Antimicrobials:  Rocephin 10/22 >> 10/25 Vanc (staph in blood ) 10/24  Cefazolin 10/25 >>  Subjective: She is alert, eating few spoon of  breakfast with assistance. Generalized weak.   Objective: Vitals:   09/01/19 0500 09/01/19 0539 09/01/19 0815 09/01/19 1100  BP:  99/60 104/72 102/70  Pulse:  79 82 80  Resp:  19    Temp:  98 F (36.7 C) 98 F (36.7 C) 98.2 F (36.8 C)  TempSrc:  Oral Oral Oral  SpO2:  100% 100% 100%  Weight: 74.6 kg     Height:        Intake/Output Summary (Last 24 hours) at 09/01/2019 1512 Last data filed at 09/01/2019 0000 Gross per 24 hour  Intake 50 ml  Output 1303 ml  Net -1253 ml   Filed Weights   08/31/19 1205 08/31/19 1542 09/01/19 0500  Weight: 75.2 kg 74.7 kg 74.6 kg    Examination:  General exam: NAD, less anxious Respiratory system: No significant crackles.  Cardiovascular system: S 1 , S 2 RRR Gastrointestinal system: BS present, soft, nt Central nervous system: Alert,  Extremities:  Upper extremities edema Skin: No rashes     Data Reviewed: I have personally reviewed following labs and imaging studies  CBC: Recent Labs  Lab 08/28/19 0258 08/28/19 1400 08/29/19 0312 08/30/19 0422 08/31/19 0403 09/01/19 0354  WBC 19.2*  --  17.7* 15.7* 19.1* 19.1*  HGB 5.9* 8.8* 8.2* 7.8* 7.8* 7.3*  HCT 18.2* 26.4* 24.3* 23.9* 24.9* 23.8*  MCV 89.2  --  85.0 87.9 90.5 92.2  PLT 426*  --  381 312 356 [id number]   Basic Metabolic Panel: Recent Labs  Lab 08/28/19 0258 08/29/19 0312 08/30/19 0422 08/30/19 0832 08/31/19 0403 08/31/19 1300 09/01/19 0354  NA 133* 135  --  139  --  135 136  K 4.9 5.5*  --  4.7  --  5.2* 4.1  CL 94* 93*  --  99  --  93* 96*  CO2 25 25  --  28  --  27 27  GLUCOSE 142* 142*  --  135*  --  138* 133*  BUN 37* 55*  --  27*  --  44* 23  CREATININE 3.11* 4.00*  --  2.85*  --  4.08* 2.42*  CALCIUM 9.2 9.2  --  9.0  --  9.1 8.9  PHOS 4.3 4.0 3.1  --  3.8 3.9 3.3   GFR: Estimated Creatinine Clearance: 21.7 mL/min (A) (by C-G formula based on SCr of 2.42 mg/dL (H)). Liver Function Tests: Recent Labs  Lab 08/31/19 1300 09/01/19 0354  ALBUMIN  1.4* 1.4*   No results for input(s): LIPASE, AMYLASE in the last 168 hours. Recent Labs  Lab 08/31/19 0403  AMMONIA 22   Coagulation Profile: Recent Labs  Lab 08/31/19 0403 09/01/19 0354  INR 1.2 1.8*   Cardiac Enzymes: No results for input(s): CKTOTAL, CKMB, CKMBINDEX, TROPONINI in the last 168 hours. BNP (last 3 results) No results for input(s): PROBNP in the last 8760 hours. HbA1C: No results for input(s): HGBA1C in the last 72 hours. CBG: Recent Labs  Lab 08/31/19 1953 08/31/19 2358 09/01/19 0351 09/01/19 0756 09/01/19 1125  GLUCAP 141* 141* 126* 132* 155*   Lipid Profile: No results for input(s): CHOL, HDL, LDLCALC, TRIG, CHOLHDL, LDLDIRECT in the last 72 hours. Thyroid Function Tests: No results for input(s): TSH, T4TOTAL, FREET4, T3FREE, THYROIDAB in the last 72 hours. Anemia Panel: Recent Labs    08/31/19 0403  VITAMINB12 386   Sepsis Labs: No results for input(s): PROCALCITON, LATICACIDVEN in the last 168 hours.  Recent Results (from the past 240 hour(s))  Culture, blood (routine x 2)     Status: None (Preliminary result)   Collection Time: 08/28/19 12:00 PM   Specimen: BLOOD  Result Value Ref Range Status   Specimen Description BLOOD BLOOD LEFT HAND  Final   Special Requests   Final    AEROBIC BOTTLE ONLY Blood Culture results may not be optimal due to an inadequate volume of blood received in culture bottles   Culture   Final    NO GROWTH 4 DAYS Performed at [facility] [address] [place], [place] [address]    Report Status PENDING  Incomplete  Culture, blood (routine x 2)     Status: None (Preliminary result)   Collection Time: 08/28/19 12:00 PM   Specimen: BLOOD  Result Value Ref Range Status   Specimen Description BLOOD LEFT ANTECUBITAL  Final   Special Requests   Final    AEROBIC BOTTLE ONLY Blood Culture results may not be optimal due to an inadequate volume of blood received in culture bottles   Culture   Final    NO  GROWTH 4 DAYS Performed at [facility], [place] [address], [place], [place] [address]    Report Status PENDING  Incomplete         Radiology Studies: Dg Chest Port 1 View  Result Date: 08/31/2019 CLINICAL DATA:  73 year old female with leukocytosis. EXAM: PORTABLE CHEST 1 VIEW COMPARISON:  Chest radiograph dated 08/21/2019. FINDINGS: Right-sided dialysis catheter with tip in similar position. Feeding tube extends below the diaphragm with tip beyond the inferior margin of the image. No significant interval change in the bilateral pleural effusions, right greater left and associated bilateral lower lobe atelectasis versus infiltrate. No pneumothorax. Stable cardiac  silhouette. Atherosclerotic calcification of the aorta. Multiple mildly displaced left rib fractures. IMPRESSION: 1. No significant interval change in the bilateral pleural effusions and associated bilateral lower lobe atelectasis versus infiltrate. 2. Multiple mildly displaced left rib fractures. No identifiable pneumothorax. Electronically Signed   By: Anner Crete M.D.   On: 08/31/2019 09:11        Scheduled Meds: .  stroke: mapping our early stages of recovery book   Does not apply Once  . amiodarone  200 mg Oral Daily  . ARIPiprazole  10 mg Oral BID  . atorvastatin  20 mg Per NG tube Daily  . chlorhexidine  15 mL Mouth Rinse BID  . Chlorhexidine Gluconate Cloth  6 each Topical Q0600  . Chlorhexidine Gluconate Cloth  6 each Topical Q0600  . darbepoetin (ARANESP) injection - DIALYSIS  100 mcg Intravenous Q Thu-HD  . DULoxetine  30 mg Oral Daily  . feeding supplement (NEPRO CARB STEADY)  237 mL Oral BID BM  . ferrous sulfate  300 mg Per Tube Q breakfast  . insulin aspart  0-9 Units Subcutaneous Q4H  . mouth rinse  15 mL Mouth Rinse BID  . midodrine  10 mg Per Tube TID WC  . multivitamin  1 tablet Oral QHS  . nystatin  5 mL Oral QID  . pantoprazole sodium  40 mg Per Tube Daily  . sodium chloride flush  10-40  mL Intracatheter Q12H  . thiamine injection  100 mg Intravenous Daily  . Thrombi-Pad  1 each Topical Once  . valproic acid  250 mg Per Tube Q6H  . warfarin  2.5 mg Oral ONCE-1800  . Warfarin - Pharmacist Dosing Inpatient   Does not apply q1800   Continuous Infusions: . sodium chloride 10 mL (08/20/19 2025)  . sodium chloride    . sodium chloride    .  ceFAZolin (ANCEF) IV 2 g (08/31/19 1937)  . feeding supplement (OSMOLITE 1.5 CAL) 1,000 mL (08/31/19 2025)     LOS: 29 days    Time spent: 35 minutes.     Elmarie Shiley, MD [facility]  If 7PM-7AM, please contact night-coverage [email or web address] Password [email or web address] 09/01/2019, 3:12 PM

## 2019-09-01 NOTE — Progress Notes (Signed)
Initial Nutrition Assessment  DOCUMENTATION CODES:   Non-severe (moderate) malnutrition in context of acute illness/injury  INTERVENTION:  -48 hr calorie count initiated, covering RD to follow up on Monday with results -Continue Nepro Shake po BID, each supplement provides 425 kcal and 19 grams protein -Continue Renal MVI daily  Nocturnal feedings: -Osmolite 1.5 @ 14ml/hr via cortrak over 12 hour period (2000-0800) Tube feeding regimen provides990kcal (56% of needs),41grams of protein, and 530ml of H2O.    NUTRITION DIAGNOSIS:   Moderate Malnutrition related to acute illness(acute respiratory failure, AKI required CRRT, endocarditis) as evidenced by mild fat depletion, mild muscle depletion, moderate muscle depletion. Ongoing   GOAL:   Patient will meet greater than or equal to 90% of their needs Meeting with po intake of meals/supplements and nocturnal feedings  MONITOR:   TF tolerance, Diet advancement, Labs, Weight trends, Skin  REASON FOR ASSESSMENT:   Consult Calorie Count  ASSESSMENT:  RD working remotely.  73 year old female who presented to the ED on 10/22 after cardiac arrest. PMH anemia, CHF, CKD stage IV, HTN, IDDM. Pt required intubation in the ED. TTM initiated.  10/22 -Admit, Intubated, normothermia TTM 10/24 - rewarmed, TF initiated 10/25 - HD catheter placed, first HD 10/27 - HD 10/28 - s/p TEE suspicious for vegetation 10/30 - CRRT initiated 11/01 - CRRT discontinued, extubated 11/03 -Cortrak tube placed, TF initiated 11/11- s/p HD cath placement 11/13- s/p MBSS- advanced to dysphagia 1 diet with thin liquids  RD consulted for calorie count to determine if patient able to be weaned from tube feedings. Per chart review pt documented with 20-30% po intake 11/18-11/19. Spoke with RN via phone who reports patient has not received tray or supplements today. RD clarified D1 diet order and calorie count with RN. Covering RD to follow up on  Monday.  Patient with improving po intake per review of 48 hour calorie count completed on 11/16:  Calorie Count Results 11/14  425 kcal (24% of minimum estimated needs)  19g protein (21% of minimum estimated needs) Calorie Count Results 11/15  815 kcal (46% of minimum estimated needs)   35g protein (39% of minimum estimated needs) Calorie Count Results 11/16 1890 kcal (100% of minimum estimated needs) 81g protein (100% of minimum estimated needs)  Diet Order:   Diet Order            DIET - DYS 1 Room service appropriate? Yes; Fluid consistency: Thin  Diet effective now              EDUCATION NEEDS:   Not appropriate for education at this time  Skin:  Skin Assessment: Reviewed RN Assessment Skin Integrity Issues:: DTI DTI: -  Last BM:  11/16  Height:   Ht Readings from Last 1 Encounters:  08/03/19 5\' 6"  (1.676 m)    Weight:   Wt Readings from Last 1 Encounters:  09/01/19 74.6 kg    Ideal Body Weight:  59.1 kg  BMI:  Body mass index is 26.55 kg/m.  Estimated Nutritional Needs:   Kcal:  [id number]  Protein:  89-105  Fluid:  >/= 1.5 L   Lajuan Lines, RD, LDN Clinical Nutrition [facility] Telephone [phone] After Hours/Weekend Pager: [phone]

## 2019-09-01 NOTE — Progress Notes (Signed)
ANTICOAGULATION CONSULT NOTE - Follow-Up Consult  Pharmacy Consult for Warfarin Indication: atrial fibrillation  Allergies  Allergen Reactions  . Codeine Other (See Comments)    Reaction not recalled by family- was told to never take this    Patient Measurements: Height: 5\' 6"  (167.6 cm) Weight: 164 lb 7.4 oz (74.6 kg) IBW/kg (Calculated) : 59.3  Vital Signs: Temp: 98 F (36.7 C) (11/20 0539) Temp Source: Oral (11/20 0539) BP: 99/60 (11/20 0539) Pulse Rate: 79 (11/20 0539)  Labs: Recent Labs    08/30/19 0422 08/30/19 0832 08/31/19 0403 08/31/19 1300 09/01/19 0354  HGB 7.8*  --  7.8*  --  7.3*  HCT 23.9*  --  24.9*  --  23.8*  PLT 312  --  356  --  301  LABPROT  --   --  14.8  --  20.6*  INR  --   --  1.2  --  1.8*  CREATININE  --  2.85*  --  4.08* 2.42*    Estimated Creatinine Clearance: 21.7 mL/min (A) (by C-G formula based on SCr of 2.42 mg/dL (H)).   Medical History: Past Medical History:  Diagnosis Date  . Anemia   . Chronic diastolic CHF (congestive heart failure) ([place])   . CKD (chronic kidney disease), stage IV ([place])   . Hypertension   . Persistent atrial fibrillation ([place])    a. dx 01/2019 s/p TEE DCCV.  Marland Kitchen Renal disorder   . Unilateral congenital absence of kidney    Assessment: Pt is a 73 yr old female admitted s/p out of hospital cardiac arrest and TTM. Admit c/b by encephalopathy, AKI which progressed to HD, and ?embolic CVA. Pt on apixaban PTA which was held for IV heparin, subsequently held on 11/16 due to bleeding from HD lines and low Hgb. Warfarin started 11/18 with no bridge planned.  INR is subtherapeutic but trending up to 1.8 today. Will give reduced dose tonight to slow down INR jump.   Goal of Therapy:  INR 2-3 Monitor platelets by anticoagulation protocol: Yes   Plan:  -Warfarin 2.5mg  PO x1 tonight -Daily protime   Arrie Senate, PharmD, BCPS Clinical Pharmacist [phone] Please check AMION for all [place]  numbers 09/01/2019

## 2019-09-01 NOTE — Progress Notes (Signed)
Physical Therapy Treatment Patient Details Name: Jaclyn Day MRN: [id number] DOB: Mar 14, 1946 Today's Date: 09/01/2019    History of Present Illness 73yo female admitted after out of hospital cardiac arrest, unclear etiology CPR started by fire 10 min CPR with 1 Epi before ROSC. PMH includes a-fib, diastolic CHF, Mood disorder, CKD. CRRT initiated on 10/31 for volume removal. Pt transitioned to intermittent HD.     PT Comments    Patient seen for mobility progression. Pt in bed upon arrival and agreeable to OOB mobility. Pt requires total A +2 for all mobility this session. RN notified for need of maxi sky lift back to bed. Continue to progress as tolerated.    Follow Up Recommendations  Supervision/Assistance - 24 hour;LTACH     Equipment Recommendations  None recommended by PT(defer to post-acute setting)    Recommendations for Other Services       Precautions / Restrictions Precautions Precautions: Fall Precaution Comments: BUE weakness with digit extension limitations noted as well Restrictions Weight Bearing Restrictions: No    Mobility  Bed Mobility Overal bed mobility: Needs Assistance Bed Mobility: Supine to Sit     Supine to sit: +2 for physical assistance;Total assist;HOB elevated     General bed mobility comments: multimodal cues for sequencing; pt did not attempt to assist with bed mobility and grimacing with movement of extremities particularly on R side  Transfers Overall transfer level: Needs assistance Equipment used: 2 person hand held assist Transfers: Lateral/Scoot Transfers          Lateral/Scoot Transfers: Total assist;+2 physical assistance;+2 safety/equipment General transfer comment: total A +2 scoot from bed to drop arm recliner  Ambulation/Gait                 Stairs             Wheelchair Mobility    Modified Rankin (Stroke Patients Only)       Balance Overall balance assessment: Needs  assistance Sitting-balance support: No upper extremity supported;Feet supported Sitting balance-Leahy Scale: Zero Sitting balance - Comments: significant posterior lean; some improvements to max A but only lasting few seconds Postural control: Posterior lean;Left lateral lean                                  Cognition Arousal/Alertness: Awake/alert Behavior During Therapy: Flat affect Overall Cognitive Status: No family/caregiver present to determine baseline cognitive functioning Area of Impairment: Attention;Memory;Following commands;Safety/judgement;Problem solving                 Orientation Level: Place;Time;Situation Current Attention Level: Focused Memory: Decreased short-term memory Following Commands: Follows one step commands with increased time Safety/Judgement: Decreased awareness of deficits   Problem Solving: Slow processing;Decreased initiation;Requires verbal cues;Requires tactile cues General Comments: pt very perseverative, needing direct simple cues to attend to task      Exercises General Exercises - Lower Extremity Ankle Circles/Pumps: AROM;Both Heel Slides: PROM;AAROM;Both;5 reps    General Comments        Pertinent Vitals/Pain Pain Assessment: Faces Faces Pain Scale: Hurts little more Pain Location: painful RUE/BLE range Pain Descriptors / Indicators: Grimacing;Guarding Pain Intervention(s): Limited activity within patient's tolerance;Monitored during session;Repositioned    Home Living                      Prior Function            PT Goals (current goals can now be  found in the care plan section) Acute Rehab PT Goals Patient Stated Goal: go home Progress towards PT goals: Progressing toward goals    Frequency    Min 2X/week      PT Plan Current plan remains appropriate    Co-evaluation PT/OT/SLP Co-Evaluation/Treatment: Yes Reason for Co-Treatment: For patient/therapist safety;To address  functional/ADL transfers PT goals addressed during session: Mobility/safety with mobility OT goals addressed during session: ADL's and self-care      AM-PAC PT "6 Clicks" Mobility   Outcome Measure  Help needed turning from your back to your side while in a flat bed without using bedrails?: Total Help needed moving from lying on your back to sitting on the side of a flat bed without using bedrails?: Total Help needed moving to and from a bed to a chair (including a wheelchair)?: Total Help needed standing up from a chair using your arms (e.g., wheelchair or bedside chair)?: Total Help needed to walk in hospital room?: Total Help needed climbing 3-5 steps with a railing? : Total 6 Click Score: 6    End of Session Equipment Utilized During Treatment: Oxygen;Gait belt Activity Tolerance: Patient tolerated treatment well Patient left: with call bell/phone within reach;in chair;with chair alarm set Nurse Communication: Mobility status;Need for lift equipment PT Visit Diagnosis: Muscle weakness (generalized) (M62.81)     Time: [id number] PT Time Calculation (min) (ACUTE ONLY): 40 min  Charges:  $Therapeutic Activity: 23-37 mins                     Earney Navy, PTA Acute Rehabilitation Services Pager: [phone] Office: [phone]     Darliss Cheney 09/01/2019, 5:29 PM

## 2019-09-01 NOTE — Progress Notes (Signed)
[facility] Progress Note    Assessment/ Plan:   #dialysis dep't AoCKD4: CKD due to hypertension.  AKI likely ATN in the setting of shock, cardiac arrest and bacteremia.  She was on CRRT 10/30-11/1.   -Kidney ultrasound showed solitary right kidney with cortical thinning and cysts. -Seen by palliative, full scope of care, potentially to LTACH?  - s/p TDC with IR 11/11 - Cont iHD THS Schedule:  Next HD 11/21 - cont to eval for possible AVF or AVG, too debilitated currently  #Hyperkalemia: Cont HD  #Acute encephalopathy: somewhat improved but still present.  11/7 MRI with scattered small acute infarcts and chronic infarcts noted.  #Cardiac arrest, acute on chronic systolic CHF: EF [id number] by echo, cardiology is following.  EF improved in TEE.  #MSSA bacteremia: Continue cefazolin. End date six weeks from last negative culture 08/07/19 - 09/18/19.  #Hypotension: Stable.  Continue midodrine.  #Acute respiratory failure with hypoxia: HD today with UF.  On 2 L oxygen.  #Paroxysmal atrial fibrillation: Cardiology following.  On amiodarone and heparin drip.  #Anemia: hgb 5.9 11/15 in setting of TDC ooze- IR has seen and stitched, Got 1 u PRBCs, better now, Hgb improved  #Dispo: ? LTACH  Subjective:    NAD, SO at bedside.     Objective:   BP 102/70   Pulse 80   Temp 98.2 F (36.8 C) (Oral)   Resp 19   Ht 5\' 6"  (1.676 m)   Wt 74.6 kg   SpO2 100%   BMI 26.55 kg/m   Intake/Output Summary (Last 24 hours) at 09/01/2019 1220 Last data filed at 09/01/2019 0000 Gross per 24 hour  Intake 50 ml  Output 1303 ml  Net -1253 ml   Weight change: 0 kg  Physical Exam: General: NAD, awake HEENT: [place] in place Heart:RRR, s1s2 nl, no rubs Lungs: on O2, clear anteriorly Abdomen:soft, Non-tender, non-distended Extremities: Trace edema, LUE edema 2+ Dialysis Access: [facility] c/d/i  Imaging: Dg Chest Port 1 View  Result Date: 08/31/2019 CLINICAL DATA:   73 year old female with leukocytosis. EXAM: PORTABLE CHEST 1 VIEW COMPARISON:  Chest radiograph dated 08/21/2019. FINDINGS: Right-sided dialysis catheter with tip in similar position. Feeding tube extends below the diaphragm with tip beyond the inferior margin of the image. No significant interval change in the bilateral pleural effusions, right greater left and associated bilateral lower lobe atelectasis versus infiltrate. No pneumothorax. Stable cardiac silhouette. Atherosclerotic calcification of the aorta. Multiple mildly displaced left rib fractures. IMPRESSION: 1. No significant interval change in the bilateral pleural effusions and associated bilateral lower lobe atelectasis versus infiltrate. 2. Multiple mildly displaced left rib fractures. No identifiable pneumothorax. Electronically Signed   By: Anner Crete M.D.   On: 08/31/2019 09:11    Labs: BMET Recent Labs  Lab 08/26/19 0404 08/27/19 0320 08/28/19 0258 08/29/19 0312 08/30/19 0422 08/30/19 0832 08/31/19 0403 08/31/19 1300 09/01/19 0354  NA 135 135 133* 135  --  139  --  135 136  K 5.1 4.1 4.9 5.5*  --  4.7  --  5.2* 4.1  CL 94* 96* 94* 93*  --  99  --  93* 96*  CO2 26 27 25 25   --  28  --  27 27  GLUCOSE 131* 146* 142* 142*  --  135*  --  138* 133*  BUN 47* 22 37* 55*  --  27*  --  44* 23  CREATININE 3.06* 2.11* 3.11* 4.00*  --  2.85*  --  4.08* 2.42*  CALCIUM 9.0 8.8* 9.2 9.2  --  9.0  --  9.1 8.9  PHOS 4.2 3.1 4.3 4.0 3.1  --  3.8 3.9 3.3   CBC Recent Labs  Lab 08/29/19 0312 08/30/19 0422 08/31/19 0403 09/01/19 0354  WBC 17.7* 15.7* 19.1* 19.1*  HGB 8.2* 7.8* 7.8* 7.3*  HCT 24.3* 23.9* 24.9* 23.8*  MCV 85.0 87.9 90.5 92.2  PLT 381 312 356 301    Medications:    .  stroke: mapping our early stages of recovery book   Does not apply Once  . amiodarone  200 mg Oral Daily  . ARIPiprazole  10 mg Oral BID  . atorvastatin  20 mg Per NG tube Daily  . chlorhexidine  15 mL Mouth Rinse BID  . Chlorhexidine  Gluconate Cloth  6 each Topical Q0600  . Chlorhexidine Gluconate Cloth  6 each Topical Q0600  . darbepoetin (ARANESP) injection - DIALYSIS  100 mcg Intravenous Q Thu-HD  . DULoxetine  30 mg Oral Daily  . feeding supplement (NEPRO CARB STEADY)  237 mL Oral BID BM  . ferrous sulfate  300 mg Per Tube Q breakfast  . insulin aspart  0-9 Units Subcutaneous Q4H  . mouth rinse  15 mL Mouth Rinse BID  . midodrine  10 mg Per Tube TID WC  . multivitamin  1 tablet Oral QHS  . nystatin  5 mL Oral QID  . pantoprazole sodium  40 mg Per Tube Daily  . sodium chloride flush  10-40 mL Intracatheter Q12H  . thiamine injection  100 mg Intravenous Daily  . Thrombi-Pad  1 each Topical Once  . valproic acid  250 mg Per Tube Q6H  . warfarin  2.5 mg Oral ONCE-1800  . Warfarin - Pharmacist Dosing Inpatient   Does not apply q1800      Madelon Lips, MD 09/01/2019, 12:20 PM

## 2019-09-01 NOTE — TOC Progression Note (Signed)
Transition of Care [facility]) - Progression Note    Patient Details  Name: Jaclyn Day MRN: [id number] Date of Birth: 08-21-1946  Transition of [facility]) CM/SW Contact  Graves-Bigelow, Ocie Cornfield, RN Phone Number: 09/01/2019, 12:53 PM  Clinical Narrative:   Expedited appeal in process for LTAC- awaiting for response from insurance. If patient gets approval over the weekend- patient will need to be transported via [facility] and MD will need to sign EMTALA.   Expected Discharge Plan: Long Term Acute Care (LTAC) Barriers to Discharge: Continued Medical Work up  Expected Discharge Plan and Services Expected Discharge Plan: [place] (LTAC) In-house Referral: NA Discharge Planning Services: CM Consult Post Acute Care Choice: [place] arrangements for the past 2 months: Single Family Home                  Readmission Risk Interventions Readmission Risk Prevention Plan 08/08/2019  Transportation Screening Complete  PCP or Specialist Appt within 3-5 Days Complete  HRI or Home Care Consult Complete  Medication Review (RN Care Manager) Complete  Some recent data might be hidden

## 2019-09-01 NOTE — Progress Notes (Signed)
  Speech Language Pathology Treatment: Dysphagia  Patient Details Name: Jaclyn Day MRN: [id number] DOB: 05-Jan-1946 Today's Date: 09/01/2019 Time: [id number] SLP Time Calculation (min) (ACUTE ONLY): 10 min  Assessment / Plan / Recommendation Clinical Impression  Pt was more alert today, making her more appropriate to try PO intake, although she did not want much of it because she preferred to watch her television show. Pt took several bites and sips with anterior spillage noted. Pt does not make attempts to manage this on her own. She tries to talk with her mouth full of food at times, needing Mod cues for redirection. There was only one cough noted at the end of PO trials. Would maintain current diet with precautions as her fluctuating mentation can also put her at risk. Will continue to follow.   HPI HPI: Pt is a 73 yo F admitted for witnessed out of hospital cardiac arrest, unclear etiology, with field ROSC, resultant respiratory failure and encephalopathy s/p TTM; ETT 10/22-11/1. CRRT initiated 10/30. PMH: unilateral congenital absence of kidney, Afib, HTN, CKD, CHF, anemia, mood disorder.  MRI on 11/7 revealed "Several scattered small acute infarcts in the white matter of the superior frontal lobes, left occipital lobe. No associated hemorrhage or mass effect. 2. Several small subacute or perhaps chronic infarcts in the bilateral cerebellum."      SLP Plan  Continue with current plan of care;MBS       Recommendations  Diet recommendations: Dysphagia 1 (puree);Thin liquid Liquids provided via: Cup;Teaspoon Medication Administration: Crushed with puree Supervision: Staff to assist with self feeding;Full supervision/cueing for compensatory strategies Compensations: Minimize environmental distractions;Slow rate;Small sips/bites Postural Changes and/or Swallow Maneuvers: Seated upright 90 degrees                Oral Care Recommendations: Oral care BID Follow up Recommendations:  LTACH SLP Visit Diagnosis: Dysphagia, oropharyngeal phase (R13.12) Plan: Continue with current plan of care;MBS       GO                Venita Sheffield Katrina Daddona 09/01/2019, 3:22 PM  [name], M.A. [place] Acute Environmental education officer [phone] Office [phone]

## 2019-09-01 NOTE — Progress Notes (Signed)
Occupational Therapy Treatment Patient Details Name: Jaclyn Day MRN: [id number] DOB: Oct 02, 1946 Today's Date: 09/01/2019    History of present illness 73yo female admitted after out of hospital cardiac arrest, unclear etiology CPR started by fire 10 min CPR with 1 Epi before ROSC. PMH includes a-fib, diastolic CHF, Mood disorder, CKD. CRRT initiated on 10/31 for volume removal. Pt transitioned to intermittent HD.    OT comments  Pt continuing to make slow progress toward states goals. Focused session on BADL mobility progression. Pt completed bed mobility at total A +2. Pt then completed lateral scoot to drop arm recliner with total A +2. Pt fearful and crying out with transfer. Cognition remains impaired, needing short direct and multimodal cues with repetition to attend to tasks. Pt completed self feeding at mod A to drink small sips of orange juice at end of session. Without support, pt with spillage on chest. D/c remains appropriate. Will continue to follow.   Follow Up Recommendations  LTACH    Equipment Recommendations  None recommended by OT    Recommendations for Other Services      Precautions / Restrictions Precautions Precautions: Fall Precaution Comments: BUE weakness with digit extension limitations noted as well Restrictions Weight Bearing Restrictions: No       Mobility Bed Mobility Overal bed mobility: Needs Assistance       Supine to sit: +2 for physical assistance;Total assist;HOB elevated     General bed mobility comments: multimodal cues for sequencing; pt did not attempt to assist with bed mobility and grimacing with movement of extremities particularly on R side  Transfers Overall transfer level: Needs assistance Equipment used: 2 person hand held assist Transfers: Lateral/Scoot Transfers          Lateral/Scoot Transfers: Total assist;+2 physical assistance;+2 safety/equipment General transfer comment: total A +2 scoot from bed to drop arm  recliner    Balance Overall balance assessment: Needs assistance Sitting-balance support: No upper extremity supported;Feet supported Sitting balance-Leahy Scale: Zero Sitting balance - Comments: significant posterior lean; some improvements to max A but only lasting few seconds Postural control: Posterior lean;Left lateral lean                                 ADL either performed or assessed with clinical judgement   ADL Overall ADL's : Needs assistance/impaired Eating/Feeding: Moderate assistance;Sitting Eating/Feeding Details (indicate cue type and reason): to take small sips of orange juice. When tried independently, she had spillage on chest                 Lower Body Dressing: Total assistance;Sitting/lateral leans               Functional mobility during ADLs: Total assistance;+2 for physical assistance(squat pivot) General ADL Comments: continuing to be limited by generalized weakness, cognitive deficits, and decreased activity tolerance     Vision       Perception     Praxis      Cognition Arousal/Alertness: Awake/alert Behavior During Therapy: Flat affect Overall Cognitive Status: No family/caregiver present to determine baseline cognitive functioning Area of Impairment: Attention;Memory;Following commands;Safety/judgement;Problem solving                   Current Attention Level: Focused Memory: Decreased short-term memory Following Commands: Follows one step commands with increased time Safety/Judgement: Decreased awareness of deficits   Problem Solving: Slow processing;Decreased initiation;Requires verbal cues;Requires tactile cues General Comments: pt very  perseverative, needing direct simple cues to attend to task        Exercises     Shoulder Instructions       General Comments      Pertinent Vitals/ Pain       Pain Assessment: Faces Faces Pain Scale: Hurts little more Pain Location: painful RUE/BLE  range Pain Descriptors / Indicators: Grimacing;Guarding Pain Intervention(s): Monitored during session;Repositioned  Home Living                                          Prior Functioning/Environment              Frequency  Min 2X/week        Progress Toward Goals  OT Goals(current goals can now be found in the care plan section)  Progress towards OT goals: Progressing toward goals  Acute Rehab OT Goals Patient Stated Goal: go home OT Goal Formulation: With patient/family Time For Goal Achievement: 09/11/19 Potential to Achieve Goals: [place] Discharge plan remains appropriate;Frequency remains appropriate    Co-evaluation    PT/OT/SLP Co-Evaluation/Treatment: Yes Reason for Co-Treatment: For patient/therapist safety;To address functional/ADL transfers PT goals addressed during session: Mobility/safety with mobility OT goals addressed during session: ADL's and self-care      AM-PAC OT "6 Clicks" Daily Activity     Outcome Measure   Help from another person eating meals?: A Lot Help from another person taking care of personal grooming?: A Lot Help from another person toileting, which includes using toliet, bedpan, or urinal?: Total Help from another person bathing (including washing, rinsing, drying)?: Total Help from another person to put on and taking off regular upper body clothing?: Total Help from another person to put on and taking off regular lower body clothing?: Total 6 Click Score: 8    End of Session    OT Visit Diagnosis: Muscle weakness (generalized) (M62.81);Feeding difficulties (R63.3);Unsteadiness on feet (R26.81);Pain Pain - Right/Left: Left Pain - part of body: Arm   Activity Tolerance Patient tolerated treatment well   Patient Left in chair;with call bell/phone within reach;with chair alarm set   Nurse Communication Mobility status;Need for lift equipment        Time: [phone] OT Time Calculation (min): 39  min  Charges: OT General Charges $OT Visit: 1 Visit OT Treatments $Self Care/Home Management : 8-22 mins  Zenovia Jarred, MSOT, OTR/L [place] OT/ Acute Relief OT [facility] Office: [phone]  Zenovia Jarred 09/01/2019, 5:18 PM

## 2019-09-01 NOTE — Progress Notes (Signed)
Progress Note  Patient Name: Jaclyn Day Date of Encounter: 09/01/2019  Primary Cardiologist: Ena Dawley, MD   Subjective   No overnight events.  Now on Coumadin and amiodarone NG, no significant shortness of breath.  Mouth agape  Inpatient Medications    Scheduled Meds: .  stroke: mapping our early stages of recovery book   Does not apply Once  . amiodarone  200 mg Oral Daily  . ARIPiprazole  10 mg Oral BID  . atorvastatin  20 mg Per NG tube Daily  . chlorhexidine  15 mL Mouth Rinse BID  . Chlorhexidine Gluconate Cloth  6 each Topical Q0600  . Chlorhexidine Gluconate Cloth  6 each Topical Q0600  . darbepoetin (ARANESP) injection - DIALYSIS  100 mcg Intravenous Q Thu-HD  . DULoxetine  30 mg Oral Daily  . feeding supplement (NEPRO CARB STEADY)  237 mL Oral BID BM  . ferrous sulfate  300 mg Per Tube Q breakfast  . insulin aspart  0-9 Units Subcutaneous Q4H  . mouth rinse  15 mL Mouth Rinse BID  . midodrine  10 mg Per Tube TID WC  . multivitamin  1 tablet Oral QHS  . nystatin  5 mL Oral QID  . pantoprazole sodium  40 mg Per Tube Daily  . sodium chloride flush  10-40 mL Intracatheter Q12H  . thiamine injection  100 mg Intravenous Daily  . Thrombi-Pad  1 each Topical Once  . valproic acid  250 mg Per Tube Q6H  . Warfarin - Pharmacist Dosing Inpatient   Does not apply q1800   Continuous Infusions: . sodium chloride 10 mL (08/20/19 2025)  . sodium chloride    . sodium chloride    .  ceFAZolin (ANCEF) IV 2 g (08/31/19 1937)  . feeding supplement (OSMOLITE 1.5 CAL) 1,000 mL (08/31/19 2025)   PRN Meds: sodium chloride, sodium chloride, acetaminophen (TYLENOL) oral liquid 160 mg/5 mL, alteplase, heparin, hydrALAZINE, HYDROmorphone (DILAUDID) injection, levalbuterol, lidocaine (PF), lidocaine-prilocaine, pentafluoroprop-tetrafluoroeth, sodium chloride flush   Vital Signs    Vitals:   09/01/19 0209 09/01/19 0419 09/01/19 0500 09/01/19 0539  BP:  98/62  99/60   Pulse:    79  Resp:    19  Temp: 98.6 F (37 C)   98 F (36.7 C)  TempSrc: Oral   Oral  SpO2:    100%  Weight:   74.6 kg   Height:        Intake/Output Summary (Last 24 hours) at 09/01/2019 0750 Last data filed at 09/01/2019 0000 Gross per 24 hour  Intake 100 ml  Output 1303 ml  Net -1203 ml   Last 3 Weights 09/01/2019 08/31/2019 08/31/2019  Weight (lbs) 164 lb 7.4 oz 164 lb 10.9 oz 165 lb 12.6 oz  Weight (kg) 74.6 kg 74.7 kg 75.2 kg      Telemetry    Atrial fibrillation currently rate controlled in the 80s- Personally Reviewed  ECG    No new- Personally Reviewed  Physical Exam   GEN: No acute distress.  Resting comfortably, mouth agape Neck: No JVD Cardiac:  Irregularly irregular, soft systolic murmur, no rubs, or gallops.  Respiratory: Clear to auscultation bilaterally. GI: Soft, nontender, non-distended  MS: No edema; No deformity. Neuro:  Nonfocal  Psych: Normal affect   Labs    High Sensitivity Troponin:   Recent Labs  Lab 08/03/19 1013 08/03/19 1147 08/03/19 1550 08/04/19 0934  TROPONINIHS 48* 715* 2,793* 3,543*      Chemistry Recent Labs  Lab 08/30/19 0832 08/31/19 1300 09/01/19 0354  NA 139 135 136  K 4.7 5.2* 4.1  CL 99 93* 96*  CO2 28 27 27   GLUCOSE 135* 138* 133*  BUN 27* 44* 23  CREATININE 2.85* 4.08* 2.42*  CALCIUM 9.0 9.1 8.9  ALBUMIN  --  1.4* 1.4*  GFRNONAA 16* 10* 19*  GFRAA 18* 12* 22*  ANIONGAP 12 15 13      Hematology Recent Labs  Lab 08/30/19 0422 08/31/19 0403 09/01/19 0354  WBC 15.7* 19.1* 19.1*  RBC 2.72* 2.75* 2.58*  HGB 7.8* 7.8* 7.3*  HCT 23.9* 24.9* 23.8*  MCV 87.9 90.5 92.2  MCH 28.7 28.4 28.3  MCHC 32.6 31.3 30.7  RDW 17.3* 17.2* 16.9*  PLT 312 356 301    BNPNo results for input(s): BNP, PROBNP in the last 168 hours.   DDimer No results for input(s): DDIMER in the last 168 hours.   Radiology    Dg Chest Port 1 View  Result Date: 08/31/2019 CLINICAL DATA:  73 year old female with  leukocytosis. EXAM: PORTABLE CHEST 1 VIEW COMPARISON:  Chest radiograph dated 08/21/2019. FINDINGS: Right-sided dialysis catheter with tip in similar position. Feeding tube extends below the diaphragm with tip beyond the inferior margin of the image. No significant interval change in the bilateral pleural effusions, right greater left and associated bilateral lower lobe atelectasis versus infiltrate. No pneumothorax. Stable cardiac silhouette. Atherosclerotic calcification of the aorta. Multiple mildly displaced left rib fractures. IMPRESSION: 1. No significant interval change in the bilateral pleural effusions and associated bilateral lower lobe atelectasis versus infiltrate. 2. Multiple mildly displaced left rib fractures. No identifiable pneumothorax. Electronically Signed   By: Anner Crete M.D.   On: 08/31/2019 09:11    Cardiac Studies   Echocardiogram 08/21/19 Impressions: 1. Left ventricular ejection fraction, by visual estimation, is 55 to 60%. The left ventricle has normal function. There is moderately increased left ventricular hypertrophy. 2. Left ventricular diastolic parameters are indeterminate. 3. Global right ventricle has normal systolic function.The right ventricular size is normal. No increase in right ventricular wall thickness. 4. Left atrial size was moderately dilated. 5. Right atrial size was mildly dilated. 6. Trivial pericardial effusion is present. 7. The mitral valve is normal in structure. Trace mitral valve regurgitation. 8. The tricuspid valve is normal in structure. Tricuspid valve regurgitation is mild. 9. The aortic valve is tricuspid. Aortic valve regurgitation is not visualized. 10. The pulmonic valve was not well visualized. Pulmonic valve regurgitation is not visualized. 11. The tricuspid regurgitant velocity is 2.79 m/s, and with an assumed right atrial pressure of 8 mmHg, the estimated right ventricular systolic pressure is mildly elevated at 39.1  mmHg. 12. The inferior vena cava is normal in size with <50% respiratory variability, suggesting right atrial pressure of 8 mmHg. 13. The interatrial septum was not well visualized.  Patient Profile     73 y.o. female with a history of persistent atrial fibrillation on Eliquis, chronic diastolic CHF, hypertension, unilateral congential absence of kidney with CKD stage 5 with prolonged hospitalization. She was admitted to ICU on 10/22 after witnessed cardiac arrest requiring intubation. She was then noted to be in atrial fibrillation with RVR and placed on IV Amiodarone and IV Heparin. She was subsequently able to be extubated. Later found to have MSSA bacteremia and multiple acute infarcts on brain MRI on 11/7. Cardiology currently following for recurrent atrial fibrillation with RVR.  Assessment & Plan    Atrial Fibrillation with RVR -Seems reasonably rate controlled with amiodarone  now p.o./NG -Continue with Coumadin per pharmacy -I do think at this time we would need to perform TEE cardioversion.  Seems hemodynamically stable.  When able, she can be transferred to rehab center.  Post Cardiac Arrest - Patient initially presented with out of hospital cardiac arrest s/p cooling protocol. LHC initially deferred due to renal function and in an attempt to avoid HD. - EF initially down to 20-25% on 08/03/2019 but improved to 55-60% on repeat Echo on 08/21/2019.  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  Volume management per HD  MSSA Bacteremia/Endocarditis - TEE on 08/09/2019 showed small filamentous mass on the aortic side of the left coronary cusp most consistent with vegetation given clinical picture of staph bacteremia. - Continue IV antibiotics per ID.  ESRD - Started on intermittent hemodialysis this admission. - Management per Nephrology.   For questions or updates, please contact [place] Please consult [email or web address] for contact info under        Signed, Candee Furbish,  MD 09/01/2019, 7:50 AM

## 2019-09-01 NOTE — Care Management Important Message (Signed)
Important Message  Patient Details  Name: Jaclyn Day MRN: [id number] Date of Birth: 14-Aug-1946   Medicare Important Message Given:  Yes     Shelda Altes 09/01/2019, 1:49 PM

## 2019-09-02 DIAGNOSIS — R7881 Bacteremia: Secondary | ICD-10-CM | POA: Diagnosis not present

## 2019-09-02 DIAGNOSIS — I330 Acute and subacute infective endocarditis: Secondary | ICD-10-CM | POA: Diagnosis not present

## 2019-09-02 DIAGNOSIS — B9561 Methicillin susceptible Staphylococcus aureus infection as the cause of diseases classified elsewhere: Secondary | ICD-10-CM | POA: Diagnosis not present

## 2019-09-02 LAB — CULTURE, BLOOD (ROUTINE X 2)
Culture: NO GROWTH
Culture: NO GROWTH

## 2019-09-02 LAB — RENAL FUNCTION PANEL
Albumin: 1.6 g/dL — ABNORMAL LOW (ref 3.5–5.0)
Anion gap: 12 (ref 5–15)
BUN: 48 mg/dL — ABNORMAL HIGH (ref 8–23)
CO2: 27 mmol/L (ref 22–32)
Calcium: 9.3 mg/dL (ref 8.9–10.3)
Chloride: 97 mmol/L — ABNORMAL LOW (ref 98–111)
Creatinine, Ser: 3.75 mg/dL — ABNORMAL HIGH (ref 0.44–1.00)
GFR calc Af Amer: 13 mL/min — ABNORMAL LOW (ref >60)
GFR calc non Af Amer: 11 mL/min — ABNORMAL LOW (ref >60)
Glucose, Bld: 137 mg/dL — ABNORMAL HIGH (ref 70–99)
Phosphorus: 4 mg/dL (ref 2.5–4.6)
Potassium: 5.4 mmol/L — ABNORMAL HIGH (ref 3.5–5.1)
Sodium: 136 mmol/L (ref 135–145)

## 2019-09-02 LAB — GLUCOSE, CAPILLARY
Glucose-Capillary: 111 mg/dL — ABNORMAL HIGH (ref 70–99)
Glucose-Capillary: 117 mg/dL — ABNORMAL HIGH (ref 70–99)
Glucose-Capillary: 125 mg/dL — ABNORMAL HIGH (ref 70–99)
Glucose-Capillary: 126 mg/dL — ABNORMAL HIGH (ref 70–99)
Glucose-Capillary: 140 mg/dL — ABNORMAL HIGH (ref 70–99)

## 2019-09-02 LAB — PROTIME-INR
INR: 3.4 — ABNORMAL HIGH (ref 0.8–1.2)
Prothrombin Time: 34.3 seconds — ABNORMAL HIGH (ref 11.4–15.2)

## 2019-09-02 LAB — CBC
HCT: 25.9 % — ABNORMAL LOW (ref 36.0–46.0)
Hemoglobin: 8 g/dL — ABNORMAL LOW (ref 12.0–15.0)
MCH: 28.3 pg (ref 26.0–34.0)
MCHC: 30.9 g/dL (ref 30.0–36.0)
MCV: 91.5 fL (ref 80.0–100.0)
Platelets: 334 10*3/uL (ref 150–400)
RBC: 2.83 MIL/uL — ABNORMAL LOW (ref 3.87–5.11)
RDW: 16.8 % — ABNORMAL HIGH (ref 11.5–15.5)
WBC: 19.8 10*3/uL — ABNORMAL HIGH (ref 4.0–10.5)
nRBC: 0 % (ref 0.0–0.2)

## 2019-09-02 LAB — PHOSPHORUS: Phosphorus: 4 mg/dL (ref 2.5–4.6)

## 2019-09-02 MED ORDER — HEPARIN SODIUM (PORCINE) 1000 UNIT/ML IJ SOLN
INTRAMUSCULAR | Status: AC
  Administered 2019-09-02: 3200 [IU] via INTRAVENOUS_CENTRAL
  Filled 2019-09-02: qty 4

## 2019-09-02 MED ORDER — VITAMIN D 25 MCG (1000 UNIT) PO TABS
1000.0000 [IU] | ORAL_TABLET | Freq: Every day | ORAL | Status: AC
  Administered 2019-09-02 – 2019-09-08 (×7): 1000 [IU] via ORAL
  Filled 2019-09-02 (×8): qty 1

## 2019-09-02 NOTE — Progress Notes (Signed)
ANTICOAGULATION CONSULT NOTE - Follow-Up Consult  Pharmacy Consult for Warfarin Indication: atrial fibrillation  Allergies  Allergen Reactions  . Codeine Other (See Comments)    Reaction not recalled by family- was told to never take this    Patient Measurements: Height: 5\' 6"  (167.6 cm) Weight: 173 lb 4.5 oz (78.6 kg) IBW/kg (Calculated) : 59.3  Vital Signs: Temp: 98.1 F (36.7 C) (11/21 0745) Temp Source: Oral (11/21 0745) BP: 110/62 (11/21 0745) Pulse Rate: 82 (11/21 0745)  Labs: Recent Labs    08/30/19 0832  08/31/19 0403 08/31/19 1300 09/01/19 0354 09/02/19 0254  HGB  --    < > 7.8*  --  7.3* 8.0*  HCT  --   --  24.9*  --  23.8* 25.9*  PLT  --   --  356  --  301 334  LABPROT  --   --  14.8  --  20.6* 34.3*  INR  --   --  1.2  --  1.8* 3.4*  CREATININE 2.85*  --   --  4.08* 2.42*  --    < > = values in this interval not displayed.    Estimated Creatinine Clearance: 22.2 mL/min (A) (by C-G formula based on SCr of 2.42 mg/dL (H)).   Medical History: Past Medical History:  Diagnosis Date  . Anemia   . Chronic diastolic CHF (congestive heart failure) ([place])   . CKD (chronic kidney disease), stage IV ([place])   . Hypertension   . Persistent atrial fibrillation ([place])    a. dx 01/2019 s/p TEE DCCV.  Marland Kitchen Renal disorder   . Unilateral congenital absence of kidney    Assessment: Pt is a 73 yr old female admitted s/p out of hospital cardiac arrest and TTM. Admit c/b by encephalopathy, AKI which progressed to HD, and ?embolic CVA. Pt on apixaban PTA which was held for IV heparin, subsequently held on 11/16 due to bleeding from HD lines and low Hgb. Warfarin started 11/18 with no bridge planned.  INR supratherapeutic this morning after reduced dose administered yesterday at 3.4. H&H stable, no signs or symptoms of bleed reported by nurse   Goal of Therapy:  INR 2-3 Monitor platelets by anticoagulation protocol: Yes   Plan:  Hold warfarin Daily INR Monitor for signs  and symptoms of bleed   Thank you for allowing pharmacy to participate in this patient's care.  Biridiana Twardowski L. Devin Going, [place] PGY1 Pharmacy Resident [phone] 09/02/19      8:15 AM  Please check AMION for all [place] phone numbers After 10:00 PM, call the [place] [phone]

## 2019-09-02 NOTE — Progress Notes (Signed)
Progress Note  Patient Name: Jaclyn Day Date of Encounter: 09/02/2019  Primary Cardiologist: Ena Dawley, MD   Subjective   No cardiac complaints   Inpatient Medications    Scheduled Meds: .  stroke: mapping our early stages of recovery book   Does not apply Once  . amiodarone  200 mg Oral Daily  . ARIPiprazole  10 mg Oral BID  . atorvastatin  20 mg Per NG tube Daily  . chlorhexidine  15 mL Mouth Rinse BID  . Chlorhexidine Gluconate Cloth  6 each Topical Q0600  . Chlorhexidine Gluconate Cloth  6 each Topical Q0600  . darbepoetin (ARANESP) injection - DIALYSIS  100 mcg Intravenous Q Thu-HD  . DULoxetine  30 mg Oral Daily  . feeding supplement (NEPRO CARB STEADY)  237 mL Oral BID BM  . ferrous sulfate  300 mg Per Tube Q breakfast  . insulin aspart  0-9 Units Subcutaneous Q4H  . mouth rinse  15 mL Mouth Rinse BID  . midodrine  10 mg Per Tube TID WC  . multivitamin  1 tablet Oral QHS  . nystatin  5 mL Oral QID  . pantoprazole sodium  40 mg Per Tube Daily  . sodium chloride flush  10-40 mL Intracatheter Q12H  . thiamine injection  100 mg Intravenous Daily  . Thrombi-Pad  1 each Topical Once  . valproic acid  250 mg Per Tube Q6H  . Warfarin - Pharmacist Dosing Inpatient   Does not apply q1800   Continuous Infusions: . sodium chloride 10 mL (08/20/19 2025)  . sodium chloride    . sodium chloride    .  ceFAZolin (ANCEF) IV 2 g (08/31/19 1937)  . feeding supplement (OSMOLITE 1.5 CAL) 1,000 mL (08/31/19 2025)   PRN Meds: sodium chloride, sodium chloride, acetaminophen (TYLENOL) oral liquid 160 mg/5 mL, alteplase, heparin, hydrALAZINE, HYDROmorphone (DILAUDID) injection, levalbuterol, lidocaine (PF), lidocaine-prilocaine, pentafluoroprop-tetrafluoroeth, sodium chloride flush   Vital Signs    Vitals:   09/01/19 2000 09/02/19 0004 09/02/19 0405 09/02/19 0745  BP:  (!) 110/59 112/61 110/62  Pulse:  64 81 82  Resp: (!) 23     Temp:  98.5 F (36.9 C) 97.7 F  (36.5 C) 98.1 F (36.7 C)  TempSrc:  Oral Oral Oral  SpO2:  99% 98%   Weight:   78.6 kg   Height:        Intake/Output Summary (Last 24 hours) at 09/02/2019 1019 Last data filed at 09/02/2019 0600 Gross per 24 hour  Intake 671 ml  Output 0 ml  Net 671 ml   Last 3 Weights 09/02/2019 09/01/2019 08/31/2019  Weight (lbs) 173 lb 4.5 oz 164 lb 7.4 oz 164 lb 10.9 oz  Weight (kg) 78.6 kg 74.6 kg 74.7 kg      Telemetry    Atrial fibrillation currently rate controlled in the 80s- Personally Reviewed  ECG    No new- Personally Reviewed  Physical Exam   GEN: No acute distress.  Resting comfortably, mouth agape with feeding tube Neck: No JVD Cardiac:  Irregularly irregular, soft systolic murmur, no rubs, or gallops.  Respiratory: Clear to auscultation bilaterally. GI: Soft, nontender, non-distended  MS: No edema; No deformity. Neuro: Aphasia  Psych: Normal affect   Labs    High Sensitivity Troponin:   Recent Labs  Lab 08/03/19 1147 08/03/19 1550 08/04/19 0934  TROPONINIHS 715* 2,793* 3,543*      Chemistry Recent Labs  Lab 08/30/19 0832 08/31/19 1300 09/01/19 0354  NA 139  135 136  K 4.7 5.2* 4.1  CL 99 93* 96*  CO2 28 27 27   GLUCOSE 135* 138* 133*  BUN 27* 44* 23  CREATININE 2.85* 4.08* 2.42*  CALCIUM 9.0 9.1 8.9  ALBUMIN  --  1.4* 1.4*  GFRNONAA 16* 10* 19*  GFRAA 18* 12* 22*  ANIONGAP 12 15 13      Hematology Recent Labs  Lab 08/31/19 0403 09/01/19 0354 09/02/19 0254  WBC 19.1* 19.1* 19.8*  RBC 2.75* 2.58* 2.83*  HGB 7.8* 7.3* 8.0*  HCT 24.9* 23.8* 25.9*  MCV 90.5 92.2 91.5  MCH 28.4 28.3 28.3  MCHC 31.3 30.7 30.9  RDW 17.2* 16.9* 16.8*  PLT 356 301 334    BNPNo results for input(s): BNP, PROBNP in the last 168 hours.   DDimer No results for input(s): DDIMER in the last 168 hours.   Radiology    No results found.  Cardiac Studies   Echocardiogram 08/21/19 Impressions: 1. Left ventricular ejection fraction, by visual estimation, is  55 to 60%. The left ventricle has normal function. There is moderately increased left ventricular hypertrophy. 2. Left ventricular diastolic parameters are indeterminate. 3. Global right ventricle has normal systolic function.The right ventricular size is normal. No increase in right ventricular wall thickness. 4. Left atrial size was moderately dilated. 5. Right atrial size was mildly dilated. 6. Trivial pericardial effusion is present. 7. The mitral valve is normal in structure. Trace mitral valve regurgitation. 8. The tricuspid valve is normal in structure. Tricuspid valve regurgitation is mild. 9. The aortic valve is tricuspid. Aortic valve regurgitation is not visualized. 10. The pulmonic valve was not well visualized. Pulmonic valve regurgitation is not visualized. 11. The tricuspid regurgitant velocity is 2.79 m/s, and with an assumed right atrial pressure of 8 mmHg, the estimated right ventricular systolic pressure is mildly elevated at 39.1 mmHg. 12. The inferior vena cava is normal in size with <50% respiratory variability, suggesting right atrial pressure of 8 mmHg. 13. The interatrial septum was not well visualized.  Patient Profile     73 y.o. female with a history of persistent atrial fibrillation on Eliquis, chronic diastolic CHF, hypertension, unilateral congential absence of kidney with CKD stage 5 with prolonged hospitalization. She was admitted to ICU on 10/22 after witnessed cardiac arrest requiring intubation. She was then noted to be in atrial fibrillation with RVR and placed on IV Amiodarone and IV Heparin. She was subsequently able to be extubated. Later found to have MSSA bacteremia and multiple acute infarcts on brain MRI on 11/7. Cardiology currently following for recurrent atrial fibrillation with RVR.  Assessment & Plan    Atrial Fibrillation with RVR -Seems reasonably rate controlled with amiodarone now p.o./NG -Continue with Coumadin per pharmacy -I do  think at this time we would need to perform TEE cardioversion.  Seems hemodynamically stable.  When able, she can be transferred to rehab center.  Post Cardiac Arrest - Patient initially presented with out of hospital cardiac arrest s/p cooling protocol. LHC initially deferred due to renal function and in an attempt to avoid HD. - EF initially down to 20-25% on 08/03/2019 but improved to 55-60% on repeat Echo on 08/21/2019.  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  Volume management per HD  MSSA Bacteremia/Endocarditis - TEE on 08/09/2019 showed small filamentous mass on the aortic side of the left coronary cusp most consistent with vegetation given clinical picture of staph bacteremia. - Continue IV antibiotics per ID.  ESRD - Started on intermittent hemodialysis this  admission. - Management per Nephrology. Cr improved to 2.42 yesterday   For questions or updates, please contact [place] Please consult [email or web address] for contact info under    Will sign off patient awaiting rehab placement will arrange outpatient f/u with Dr Meda Coffee    Signed, Jenkins Rouge, MD 09/02/2019, 10:19 AM

## 2019-09-02 NOTE — Progress Notes (Addendum)
PROGRESS NOTE    Jaclyn Day  [id number] DOB: 11-23-1945 DOA: 08/03/2019 PCP: Cyndi Bender, PA-C   Brief Narrative: 73 year old past medical history significant for congenital unilateral kidney, stage IV chronic kidney disease with baseline creatinine about 2.2--2.8, hypertension, persistent A. fib was on Eliquis, chronic diastolic heart failure, history of cardioversion for A. fib admitted to ICU after witnessed cardiac arrest.  On 10/22 patient was admitted after cardiac arrest, intubated, initiation of targeted temperature management 36 Celsius.  Echo showed left ventricular ejection fraction 20 to 25%, global hypokinesis, RV mildly reduced function with normal size. On 10/25: Blood cultures came back positive for staph.  She was a started on vancomycin. Patient was a started on amiodarone drip, she is off of Levophed and neo-.  She was a started on heparin drip, which was subsequently discontinued.  She was also started on hemodialysis.  Patient has been transitioned from amiodarone drip to oral amiodarone.  She was started on Coumadin. 10/26: Patient was started on Precedex for agitation, femoral CVL was discontinued.    Assessment & Plan:   Principal Problem:   MSSA bacteremia Active Problems:   Cardiac arrest ([place])   Pressure injury of skin   Acute respiratory failure with hypoxia (HCC)   AKI (acute kidney injury) (HCC)   Elevated troponin   Persistent atrial fibrillation (HCC)   Acute on chronic combined systolic and diastolic CHF (congestive heart failure) (HCC)   Acute bacterial endocarditis   Malnutrition of moderate degree   Advanced care planning/counseling discussion   Goals of care, counseling/discussion   Palliative care by specialist   Closed fracture of multiple ribs   Cerebral embolism with cerebral infarction  1-Acute hypoxic respiratory failure due to cardiac arrest and pulmonary edema: Bilateral pleural effusion: Patient presented after outside  hospital cardiac arrest Patient was intubated and is status post extubation, on nasal cannula oxygen Nephrology managing volume status via hemodialysis Patient more tachypnea 11/19, chest x ray stable from before. She gets anxious and hyperventilate.  Respiratory status stable.   2-Acute metabolic encephalopathy, suspect multifactorial with recent multiple acute CVA -Setting of cardiac arrest, hypothermia, MSSA bacteremia, prolonged hospitalization. CT head 11/11 stable Mental status is slowly improving. Sleepy at times.  On valproic acid continue with as long standing medication EEG negative for seizure Palliative care was consulted. MRI on 08/19/2019 showed several scattered small acute infarct in the white matter of the superior frontal lobe, left occipital lobe.  Several small subacute chronic infarct in bilateral cerebellum. Neurology help with patient care. TTE no vegetation or thrombus. MS fluctuates.   3-Hemodialysis catheter side related: 11/15 IR saw the patient, status post 10 cc 1% lidocaine with epinephrine was injected into subcutaneous tract Stable  4-Multiple acute subacute CVA involving white matter: Patient was on heparin drip.  Neurology was okay with anticoagulation. On coumadin.   5-Witnessed cardiac arrest in the setting of chronic A. Fib: Patient treated with hypothermia protocol Left heart cath initially deferred due to acute on chronic renal failure Ejection fraction initially down to 20 to 25% on 10/20-second/2020, improved to 55 to 60% on echo 11/90/2020. Follow cardiology recommendation in regard ischemic evaluation  6-A. fib with RVR: Cardiology was following. Follow-up with cardiology for further evaluation and consideration of TEE and DCCV Heparin on hold due to anemia. On coumadin, no bridge due to concern for bleeding.  Amiodarone change to oral.  No plan for inpatient cardioversion per cardiology.   7-Acute on chronic systolic heart failure:  TTE on  10/22 with EF 20%.  Repeat TEE with improved ejection fraction to 60%.  Volume management with hemodialysis.  8-MSSA bacteremia; and endocarditis, Vegetation AV  Infectious vs fibrous tissue.  Repeat blood cultures negative. ID recommended cefazolin until 12/7. Treating for 6 weeks.    9-Acute blood loss anemia: No sign of overt bleeding, she had bled from hemodialysis catheter on 11/15. Status post transfuse 1 unit of packed red blood cell. Hs has remain 8 range.   10-AKI on chronic kidney disease a stage IV: Suspect AKI likely ATN in the setting of shock, cardiac arrest bacteremia. She was initially started on CRRT 10/30---11/1 Currently getting hemodialysis. Too weak to undergoing AVF or  AV graft  11-Hyperkalemia: Resolved  12-Nutrition: On tube feeding via core track tube He was a started on dysphagia 1 diet. He was transitioned to nocturnal feeding Osmolite. Monitor oral intake to determine removal of tube feeding Calory counts.   13-oral thrush; started nystatin. 14-Leukocytosis;  On IV antibiotics. Chest x ray stable. Follow trend. .  Blood culture 11-20: no growth to date.  Nurse to get UA, in and out cath.  Vitamin D deficiency; replete for 7 day repeat level.   Nutrition Problem: Moderate Malnutrition Etiology: acute illness(acute respiratory failure, AKI required CRRT, endocarditis)    Signs/Symptoms: mild fat depletion, mild muscle depletion, moderate muscle depletion    Interventions: Tube feeding  Estimated body mass index is 27.97 kg/m as calculated from the following:   Height as of this encounter: 5\' 6"  (1.676 m).   Weight as of this encounter: 78.6 kg.   DVT prophylaxis: Heparin Code Status: Full code Family Communication: [name] at bedside.  Disposition Plan: Remain in the hospital for amiodarone infusion, antibiotics, hemodialysis. Peer to peer reviewed today for LTAC was decline.  Consultants:  Nephrology IR Neurology Palliative  care Cardiology  Procedures:  placement of tunneled HD catheter, CRRT, HD  Antimicrobials:  Rocephin 10/22 >> 10/25 Vanc (staph in blood ) 10/24  Cefazolin 10/25 >>  Subjective: She is alert today, she would like to eat breakfast.  She is very deconditioning and generalized weak.  She barely  able to move upper extremities.  Objective: Vitals:   09/01/19 2000 09/02/19 0004 09/02/19 0405 09/02/19 0745  BP:  (!) 110/59 112/61 110/62  Pulse:  64 81 82  Resp: (!) 23     Temp:  98.5 F (36.9 C) 97.7 F (36.5 C) 98.1 F (36.7 C)  TempSrc:  Oral Oral Oral  SpO2:  99% 98%   Weight:   78.6 kg   Height:        Intake/Output Summary (Last 24 hours) at 09/02/2019 1136 Last data filed at 09/02/2019 0600 Gross per 24 hour  Intake 671 ml  Output 0 ml  Net 671 ml   Filed Weights   08/31/19 1542 09/01/19 0500 09/02/19 0405  Weight: 74.7 kg 74.6 kg 78.6 kg    Examination:  General exam: No acute distress alert chronic ill-appearing core track in place Respiratory system: Decreased breath sounds crackles at the bases Cardiovascular system: 1 S2 regular rhythm and rate Gastrointestinal system: Bowel sounds present, soft nontender nondistended Central nervous system: He is alert, answer question, generalized weak. Extremities: Edema all 4 extremity worse on the left upper arm Skin: No rash     Data Reviewed: I have personally reviewed following labs and imaging studies  CBC: Recent Labs  Lab 08/29/19 0312 08/30/19 0422 08/31/19 0403 09/01/19 0354 09/02/19 0254  WBC 17.7* 15.7* 19.1* 19.1*  19.8*  HGB 8.2* 7.8* 7.8* 7.3* 8.0*  HCT 24.3* 23.9* 24.9* 23.8* 25.9*  MCV 85.0 87.9 90.5 92.2 91.5  PLT 381 312 356 301 [id number]   Basic Metabolic Panel: Recent Labs  Lab 08/28/19 0258 08/29/19 0312 08/30/19 0422 08/30/19 0832 08/31/19 0403 08/31/19 1300 09/01/19 0354 09/02/19 0254  NA 133* 135  --  139  --  135 136  --   K 4.9 5.5*  --  4.7  --  5.2* 4.1  --   CL 94* 93*   --  99  --  93* 96*  --   CO2 25 25  --  28  --  27 27  --   GLUCOSE 142* 142*  --  135*  --  138* 133*  --   BUN 37* 55*  --  27*  --  44* 23  --   CREATININE 3.11* 4.00*  --  2.85*  --  4.08* 2.42*  --   CALCIUM 9.2 9.2  --  9.0  --  9.1 8.9  --   PHOS 4.3 4.0 3.1  --  3.8 3.9 3.3 4.0   GFR: Estimated Creatinine Clearance: 22.2 mL/min (A) (by C-G formula based on SCr of 2.42 mg/dL (H)). Liver Function Tests: Recent Labs  Lab 08/31/19 1300 09/01/19 0354  ALBUMIN 1.4* 1.4*   No results for input(s): LIPASE, AMYLASE in the last 168 hours. Recent Labs  Lab 08/31/19 0403  AMMONIA 22   Coagulation Profile: Recent Labs  Lab 08/31/19 0403 09/01/19 0354 09/02/19 0254  INR 1.2 1.8* 3.4*   Cardiac Enzymes: No results for input(s): CKTOTAL, CKMB, CKMBINDEX, TROPONINI in the last 168 hours. BNP (last 3 results) No results for input(s): PROBNP in the last 8760 hours. HbA1C: No results for input(s): HGBA1C in the last 72 hours. CBG: Recent Labs  Lab 09/01/19 1656 09/01/19 1946 09/02/19 0005 09/02/19 0409 09/02/19 0738  GLUCAP 96 190* 117* 126* 140*   Lipid Profile: No results for input(s): CHOL, HDL, LDLCALC, TRIG, CHOLHDL, LDLDIRECT in the last 72 hours. Thyroid Function Tests: No results for input(s): TSH, T4TOTAL, FREET4, T3FREE, THYROIDAB in the last 72 hours. Anemia Panel: Recent Labs    08/31/19 0403  VITAMINB12 386   Sepsis Labs: No results for input(s): PROCALCITON, LATICACIDVEN in the last 168 hours.  Recent Results (from the past 240 hour(s))  Culture, blood (routine x 2)     Status: None   Collection Time: 08/28/19 12:00 PM   Specimen: BLOOD  Result Value Ref Range Status   Specimen Description BLOOD BLOOD LEFT HAND  Final   Special Requests   Final    AEROBIC BOTTLE ONLY Blood Culture results may not be optimal due to an inadequate volume of blood received in culture bottles   Culture   Final    NO GROWTH 5 DAYS Performed at [facility], [place] [address], [place], [place] [address]    Report Status 09/02/2019 FINAL  Final  Culture, blood (routine x 2)     Status: None   Collection Time: 08/28/19 12:00 PM   Specimen: BLOOD  Result Value Ref Range Status   Specimen Description BLOOD LEFT ANTECUBITAL  Final   Special Requests   Final    AEROBIC BOTTLE ONLY Blood Culture results may not be optimal due to an inadequate volume of blood received in culture bottles   Culture   Final    NO GROWTH 5 DAYS Performed at [facility], [address] [place], [place] [address]    Report Status 09/02/2019 FINAL  Final  Culture, blood (routine x 2)     Status: None (Preliminary result)   Collection Time: 09/01/19  4:26 PM   Specimen: BLOOD LEFT HAND  Result Value Ref Range Status   Specimen Description BLOOD LEFT HAND  Final   Special Requests   Final    BOTTLES DRAWN AEROBIC ONLY Blood Culture adequate volume   Culture   Final    NO GROWTH < 24 HOURS Performed at [facility], [place] [address] [place], [place] [address]    Report Status PENDING  Incomplete  Culture, blood (routine x 2)     Status: None (Preliminary result)   Collection Time: 09/01/19  4:26 PM   Specimen: BLOOD LEFT HAND  Result Value Ref Range Status   Specimen Description BLOOD LEFT HAND  Final   Special Requests   Final    BOTTLES DRAWN AEROBIC ONLY Blood Culture adequate volume   Culture   Final    NO GROWTH < 24 HOURS Performed at [facility], [place] [address] [place], [place] [address]    Report Status PENDING  Incomplete         Radiology Studies: No results found.      Scheduled Meds: .  stroke: mapping our early stages of recovery book   Does not apply Once  . amiodarone  200 mg Oral Daily  . ARIPiprazole  10 mg Oral BID  . atorvastatin  20 mg Per NG tube Daily  . chlorhexidine  15 mL Mouth Rinse BID  . Chlorhexidine Gluconate Cloth  6 each Topical Q0600  . Chlorhexidine Gluconate Cloth  6 each Topical Q0600  .  darbepoetin (ARANESP) injection - DIALYSIS  100 mcg Intravenous Q Thu-HD  . DULoxetine  30 mg Oral Daily  . feeding supplement (NEPRO CARB STEADY)  237 mL Oral BID BM  . ferrous sulfate  300 mg Per Tube Q breakfast  . insulin aspart  0-9 Units Subcutaneous Q4H  . mouth rinse  15 mL Mouth Rinse BID  . midodrine  10 mg Per Tube TID WC  . multivitamin  1 tablet Oral QHS  . nystatin  5 mL Oral QID  . pantoprazole sodium  40 mg Per Tube Daily  . sodium chloride flush  10-40 mL Intracatheter Q12H  . thiamine injection  100 mg Intravenous Daily  . Thrombi-Pad  1 each Topical Once  . valproic acid  250 mg Per Tube Q6H  . Warfarin - Pharmacist Dosing Inpatient   Does not apply q1800   Continuous Infusions: . sodium chloride 10 mL (08/20/19 2025)  . sodium chloride    . sodium chloride    .  ceFAZolin (ANCEF) IV 2 g (08/31/19 1937)  . feeding supplement (OSMOLITE 1.5 CAL) 1,000 mL (08/31/19 2025)     LOS: 30 days    Time spent: 35 minutes.     Elmarie Shiley, MD [facility]  If 7PM-7AM, please contact night-coverage [email or web address] Password [facility] 09/02/2019, 11:36 AM

## 2019-09-02 NOTE — Progress Notes (Signed)
[facility] Progress Note    Assessment/ Plan:   #dialysis dep't AoCKD4: CKD due to hypertension.  AKI likely ATN in the setting of shock, cardiac arrest and bacteremia.  She was on CRRT 10/30-11/1.   -Kidney ultrasound showed solitary right kidney with cortical thinning and cysts. -Seen by palliative, full scope of care, potentially to LTACH?  - s/p TDC with IR 11/11 - Cont iHD THS Schedule:  Next HD 11/21 - cont to eval for possible AVF or AVG, too debilitated currently  #Hyperkalemia: Cont HD  #Acute encephalopathy: somewhat improved but still present.  11/7 MRI with scattered small acute infarcts and chronic infarcts noted.  #Cardiac arrest, acute on chronic systolic CHF: EF 123456 by echo, cardiology is following.  EF improved in TEE.  #MSSA bacteremia: Continue cefazolin. End date six weeks from last negative culture 08/07/19 - 09/18/19.  #Hypotension: Stable.  Continue midodrine.  #Acute respiratory failure with hypoxia: HD today with UF.  On 2 L oxygen.  #Paroxysmal atrial fibrillation: Cardiology following.  On amiodarone and heparin drip.  #Anemia: hgb 5.9 11/15 in setting of TDC ooze- IR has seen and stitched, Got 1 u PRBCs, better now, Hgb improved  #Dispo: ? LTACH  Subjective:    Awake, no distress, for HD today.  Ate per SO.   Objective:   BP 110/62 (BP Location: Right Arm)   Pulse 82   Temp 98.1 F (36.7 C) (Oral)   Resp (!) 23   Ht 5\' 6"  (1.676 m)   Wt 78.6 kg   SpO2 98%   BMI 27.97 kg/m   Intake/Output Summary (Last 24 hours) at 09/02/2019 1350 Last data filed at 09/02/2019 0600 Gross per 24 hour  Intake 671 ml  Output 0 ml  Net 671 ml   Weight change: 3.4 kg  Physical Exam: General: NAD, awake HEENT: [place] in place Heart:RRR, s1s2 nl, no rubs Lungs: on O2, clear anteriorly Abdomen:soft, Non-tender, non-distended Extremities: Trace edema, LUE edema 2+ Dialysis Access: [facility] c/d/i  Imaging: No results  found.  Labs: BMET Recent Labs  Lab 08/27/19 0320 08/28/19 0258 08/29/19 0312 08/30/19 0422 08/30/19 0832 08/31/19 0403 08/31/19 1300 09/01/19 0354 09/02/19 0254  NA 135 133* 135  --  139  --  135 136  --   K 4.1 4.9 5.5*  --  4.7  --  5.2* 4.1  --   CL 96* 94* 93*  --  99  --  93* 96*  --   CO2 27 25 25   --  28  --  27 27  --   GLUCOSE 146* 142* 142*  --  135*  --  138* 133*  --   BUN 22 37* 55*  --  27*  --  44* 23  --   CREATININE 2.11* 3.11* 4.00*  --  2.85*  --  4.08* 2.42*  --   CALCIUM 8.8* 9.2 9.2  --  9.0  --  9.1 8.9  --   PHOS 3.1 4.3 4.0 3.1  --  3.8 3.9 3.3 4.0   CBC Recent Labs  Lab 08/30/19 0422 08/31/19 0403 09/01/19 0354 09/02/19 0254  WBC 15.7* 19.1* 19.1* 19.8*  HGB 7.8* 7.8* 7.3* 8.0*  HCT 23.9* 24.9* 23.8* 25.9*  MCV 87.9 90.5 92.2 91.5  PLT 312 356 301 334    Medications:    .  stroke: mapping our early stages of recovery book   Does not apply Once  . amiodarone  200 mg Oral Daily  .  ARIPiprazole  10 mg Oral BID  . atorvastatin  20 mg Per NG tube Daily  . chlorhexidine  15 mL Mouth Rinse BID  . Chlorhexidine Gluconate Cloth  6 each Topical Q0600  . Chlorhexidine Gluconate Cloth  6 each Topical Q0600  . darbepoetin (ARANESP) injection - DIALYSIS  100 mcg Intravenous Q Thu-HD  . DULoxetine  30 mg Oral Daily  . feeding supplement (NEPRO CARB STEADY)  237 mL Oral BID BM  . ferrous sulfate  300 mg Per Tube Q breakfast  . insulin aspart  0-9 Units Subcutaneous Q4H  . mouth rinse  15 mL Mouth Rinse BID  . midodrine  10 mg Per Tube TID WC  . multivitamin  1 tablet Oral QHS  . nystatin  5 mL Oral QID  . pantoprazole sodium  40 mg Per Tube Daily  . sodium chloride flush  10-40 mL Intracatheter Q12H  . thiamine injection  100 mg Intravenous Daily  . Thrombi-Pad  1 each Topical Once  . valproic acid  250 mg Per Tube Q6H  . cholecalciferol  1,000 Units Oral Daily  . Warfarin - Pharmacist Dosing Inpatient   Does not apply q1800       Madelon Lips, MD 09/02/2019, 1:50 PM

## 2019-09-03 DIAGNOSIS — R7881 Bacteremia: Secondary | ICD-10-CM | POA: Diagnosis not present

## 2019-09-03 DIAGNOSIS — B9561 Methicillin susceptible Staphylococcus aureus infection as the cause of diseases classified elsewhere: Secondary | ICD-10-CM | POA: Diagnosis not present

## 2019-09-03 LAB — CBC
HCT: 25.7 % — ABNORMAL LOW (ref 36.0–46.0)
Hemoglobin: 7.8 g/dL — ABNORMAL LOW (ref 12.0–15.0)
MCH: 28.2 pg (ref 26.0–34.0)
MCHC: 30.4 g/dL (ref 30.0–36.0)
MCV: 92.8 fL (ref 80.0–100.0)
Platelets: 357 10*3/uL (ref 150–400)
RBC: 2.77 MIL/uL — ABNORMAL LOW (ref 3.87–5.11)
RDW: 16.6 % — ABNORMAL HIGH (ref 11.5–15.5)
WBC: 19.8 10*3/uL — ABNORMAL HIGH (ref 4.0–10.5)
nRBC: 0.1 % (ref 0.0–0.2)

## 2019-09-03 LAB — GLUCOSE, CAPILLARY
Glucose-Capillary: 118 mg/dL — ABNORMAL HIGH (ref 70–99)
Glucose-Capillary: 122 mg/dL — ABNORMAL HIGH (ref 70–99)
Glucose-Capillary: 125 mg/dL — ABNORMAL HIGH (ref 70–99)
Glucose-Capillary: 159 mg/dL — ABNORMAL HIGH (ref 70–99)
Glucose-Capillary: 87 mg/dL (ref 70–99)

## 2019-09-03 LAB — PROTIME-INR
INR: 2.3 — ABNORMAL HIGH (ref 0.8–1.2)
Prothrombin Time: 25.1 seconds — ABNORMAL HIGH (ref 11.4–15.2)

## 2019-09-03 LAB — PHOSPHORUS: Phosphorus: 3 mg/dL (ref 2.5–4.6)

## 2019-09-03 MED ORDER — WARFARIN SODIUM 2.5 MG PO TABS
2.5000 mg | ORAL_TABLET | Freq: Once | ORAL | Status: AC
  Administered 2019-09-03: 2.5 mg via ORAL
  Filled 2019-09-03: qty 1

## 2019-09-03 MED ORDER — BOOST / RESOURCE BREEZE PO LIQD CUSTOM
1.0000 | Freq: Three times a day (TID) | ORAL | Status: DC
  Administered 2019-09-03 – 2019-09-24 (×33): 1 via ORAL

## 2019-09-03 MED ORDER — OSMOLITE 1.5 CAL PO LIQD
1000.0000 mL | ORAL | Status: DC
  Administered 2019-09-03 – 2019-09-11 (×6): 1000 mL
  Filled 2019-09-03 (×9): qty 1000

## 2019-09-03 NOTE — Progress Notes (Signed)
[facility] Progress Note    Assessment/ Plan:   #dialysis dep't AoCKD4: CKD due to hypertension.  AKI likely ATN in the setting of shock, cardiac arrest and bacteremia.  She was on CRRT 10/30-11/1.   -Kidney ultrasound showed solitary right kidney with cortical thinning and cysts. -Seen by palliative, full scope of care, potentially to LTACH?  - s/p TDC with IR 11/11 - Cont iHD THS Schedule:  Next HD 11/23 Monday to accommodate for the holiday schedule - cont to eval for possible AVF or AVG, too debilitated currently  #Hyperkalemia: Cont HD  #Acute encephalopathy: somewhat improved but still present.  11/7 MRI with scattered small acute infarcts and chronic infarcts noted.  #Cardiac arrest, acute on chronic systolic CHF: EF 123456 by echo, cardiology is following.  EF improved in TEE.  #MSSA bacteremia: Continue cefazolin. End date six weeks from last negative culture 08/07/19 - 09/18/19.  #Hypotension: Stable.  Continue midodrine.  #Acute respiratory failure with hypoxia: HD today with UF.  On 2 L oxygen.  #Paroxysmal atrial fibrillation: Cardiology following.  On amiodarone and heparin drip.  #Anemia: hgb 5.9 11/15 in setting of TDC ooze- IR has seen and stitched, Got 1 u PRBCs, better now, Hgb improved, started Aranesp 100 q Thursday  #Leukocytosis- CXR clear, getting urine sample, per primary  #Dispo: ? LTACH  Subjective:    HD yesterday, had 3.4L off.  This AM pt is awake and alert.     Objective:   BP 123/73 (BP Location: Right Leg)   Pulse (!) 105   Temp 99.3 F (37.4 C) (Oral)   Resp (!) 25   Ht 5\' 6"  (1.676 m)   Wt 75 kg   SpO2 100%   BMI 26.69 kg/m   Intake/Output Summary (Last 24 hours) at 09/03/2019 1140 Last data filed at 09/03/2019 0300 Gross per 24 hour  Intake 1837 ml  Output 3478 ml  Net -1641 ml   Weight change: -1.8 kg  Physical Exam: General: NAD, awake HEENT: [place] in place Heart:RRR, s1s2 nl, no rubs Lungs: on  O2, clear anteriorly Abdomen:soft, Non-tender, non-distended Extremities: Trace edema, LUE edema 2+ Dialysis Access: [facility] c/d/i  Imaging: No results found.  Labs: BMET Recent Labs  Lab 08/28/19 0258 08/29/19 0312 08/30/19 0422 08/30/19 0832 08/31/19 0403 08/31/19 1300 09/01/19 0354 09/02/19 0254 09/02/19 1304 09/03/19 0325  NA 133* 135  --  139  --  135 136  --  136  --   K 4.9 5.5*  --  4.7  --  5.2* 4.1  --  5.4*  --   CL 94* 93*  --  99  --  93* 96*  --  97*  --   CO2 25 25  --  28  --  27 27  --  27  --   GLUCOSE 142* 142*  --  135*  --  138* 133*  --  137*  --   BUN 37* 55*  --  27*  --  44* 23  --  48*  --   CREATININE 3.11* 4.00*  --  2.85*  --  4.08* 2.42*  --  3.75*  --   CALCIUM 9.2 9.2  --  9.0  --  9.1 8.9  --  9.3  --   PHOS 4.3 4.0 3.1  --  3.8 3.9 3.3 4.0 4.0 3.0   CBC Recent Labs  Lab 08/31/19 0403 09/01/19 0354 09/02/19 0254 09/03/19 0325  WBC 19.1* 19.1* 19.8* 19.8*  HGB 7.8* 7.3* 8.0* 7.8*  HCT 24.9* 23.8* 25.9* 25.7*  MCV 90.5 92.2 91.5 92.8  PLT 356 301 334 357    Medications:    .  stroke: mapping our early stages of recovery book   Does not apply Once  . amiodarone  200 mg Oral Daily  . ARIPiprazole  10 mg Oral BID  . atorvastatin  20 mg Per NG tube Daily  . chlorhexidine  15 mL Mouth Rinse BID  . Chlorhexidine Gluconate Cloth  6 each Topical Q0600  . Chlorhexidine Gluconate Cloth  6 each Topical Q0600  . cholecalciferol  1,000 Units Oral Daily  . darbepoetin (ARANESP) injection - DIALYSIS  100 mcg Intravenous Q Thu-HD  . DULoxetine  30 mg Oral Daily  . feeding supplement (NEPRO CARB STEADY)  237 mL Oral BID BM  . ferrous sulfate  300 mg Per Tube Q breakfast  . insulin aspart  0-9 Units Subcutaneous Q4H  . mouth rinse  15 mL Mouth Rinse BID  . midodrine  10 mg Per Tube TID WC  . multivitamin  1 tablet Oral QHS  . nystatin  5 mL Oral QID  . pantoprazole sodium  40 mg Per Tube Daily  . sodium chloride flush  10-40 mL Intracatheter  Q12H  . thiamine injection  100 mg Intravenous Daily  . Thrombi-Pad  1 each Topical Once  . valproic acid  250 mg Per Tube Q6H  . warfarin  2.5 mg Oral ONCE-1800  . Warfarin - Pharmacist Dosing Inpatient   Does not apply q1800      Madelon Lips, MD 09/03/2019, 11:40 AM

## 2019-09-03 NOTE — Progress Notes (Signed)
Progress Note  Patient Name: Jaclyn Day Date of Encounter: 09/03/2019  Primary Cardiologist: Ena Dawley, MD   Subjective   No cardiac complaints   Inpatient Medications    Scheduled Meds: .  stroke: mapping our early stages of recovery book   Does not apply Once  . amiodarone  200 mg Oral Daily  . ARIPiprazole  10 mg Oral BID  . atorvastatin  20 mg Per NG tube Daily  . chlorhexidine  15 mL Mouth Rinse BID  . Chlorhexidine Gluconate Cloth  6 each Topical Q0600  . Chlorhexidine Gluconate Cloth  6 each Topical Q0600  . cholecalciferol  1,000 Units Oral Daily  . darbepoetin (ARANESP) injection - DIALYSIS  100 mcg Intravenous Q Thu-HD  . DULoxetine  30 mg Oral Daily  . feeding supplement (NEPRO CARB STEADY)  237 mL Oral BID BM  . ferrous sulfate  300 mg Per Tube Q breakfast  . insulin aspart  0-9 Units Subcutaneous Q4H  . mouth rinse  15 mL Mouth Rinse BID  . midodrine  10 mg Per Tube TID WC  . multivitamin  1 tablet Oral QHS  . nystatin  5 mL Oral QID  . pantoprazole sodium  40 mg Per Tube Daily  . sodium chloride flush  10-40 mL Intracatheter Q12H  . thiamine injection  100 mg Intravenous Daily  . Thrombi-Pad  1 each Topical Once  . valproic acid  250 mg Per Tube Q6H  . Warfarin - Pharmacist Dosing Inpatient   Does not apply q1800   Continuous Infusions: . sodium chloride 10 mL (08/20/19 2025)  . sodium chloride    . sodium chloride    .  ceFAZolin (ANCEF) IV 2 g (09/02/19 1830)  . feeding supplement (OSMOLITE 1.5 CAL) 1,000 mL (08/31/19 2025)   PRN Meds: sodium chloride, sodium chloride, acetaminophen (TYLENOL) oral liquid 160 mg/5 mL, alteplase, heparin, hydrALAZINE, HYDROmorphone (DILAUDID) injection, levalbuterol, lidocaine (PF), lidocaine-prilocaine, pentafluoroprop-tetrafluoroeth, sodium chloride flush   Vital Signs    Vitals:   09/02/19 2005 09/02/19 2038 09/03/19 0011 09/03/19 0351  BP: (!) 125/97  108/69 123/73  Pulse: (!) 110 98 94 (!) 105   Resp: (!) 36 (!) 24 (!) 25 (!) 25  Temp: 98.2 F (36.8 C)  98.8 F (37.1 C) 99.3 F (37.4 C)  TempSrc: Oral  Oral Oral  SpO2: 100% 100% 100% 100%  Weight:    75 kg  Height:        Intake/Output Summary (Last 24 hours) at 09/03/2019 0819 Last data filed at 09/03/2019 0300 Gross per 24 hour  Intake 1837 ml  Output 3478 ml  Net -1641 ml   Last 3 Weights 09/03/2019 09/02/2019 09/02/2019  Weight (lbs) 165 lb 5.5 oz 161 lb 6 oz 169 lb 5 oz  Weight (kg) 75 kg 73.2 kg 76.8 kg      Telemetry    Atrial fibrillation currently rate controlled in the 80s- Personally Reviewed  ECG    No new- Personally Reviewed  Physical Exam   GEN: No acute distress.  Resting comfortably, mouth agape with feeding tube Neck: No JVD Cardiac:  Irregularly irregular, soft systolic murmur, no rubs, or gallops.  Respiratory: Clear to auscultation bilaterally. GI: Soft, nontender, non-distended  MS: No edema; No deformity. Neuro: Aphasia  Psych: Normal affect   Labs    High Sensitivity Troponin:   Recent Labs  Lab 08/04/19 0934  TROPONINIHS 3,543*      Chemistry Recent Labs  Lab 08/31/19 1300  09/01/19 0354 09/02/19 1304  NA 135 136 136  K 5.2* 4.1 5.4*  CL 93* 96* 97*  CO2 27 27 27   GLUCOSE 138* 133* 137*  BUN 44* 23 48*  CREATININE 4.08* 2.42* 3.75*  CALCIUM 9.1 8.9 9.3  ALBUMIN 1.4* 1.4* 1.6*  GFRNONAA 10* 19* 11*  GFRAA 12* 22* 13*  ANIONGAP 15 13 12      Hematology Recent Labs  Lab 09/01/19 0354 09/02/19 0254 09/03/19 0325  WBC 19.1* 19.8* 19.8*  RBC 2.58* 2.83* 2.77*  HGB 7.3* 8.0* 7.8*  HCT 23.8* 25.9* 25.7*  MCV 92.2 91.5 92.8  MCH 28.3 28.3 28.2  MCHC 30.7 30.9 30.4  RDW 16.9* 16.8* 16.6*  PLT 301 334 357    BNPNo results for input(s): BNP, PROBNP in the last 168 hours.   DDimer No results for input(s): DDIMER in the last 168 hours.   Radiology    No results found.  Cardiac Studies   Echocardiogram 08/21/19 Impressions: 1. Left ventricular  ejection fraction, by visual estimation, is 55 to 60%. The left ventricle has normal function. There is moderately increased left ventricular hypertrophy. 2. Left ventricular diastolic parameters are indeterminate. 3. Global right ventricle has normal systolic function.The right ventricular size is normal. No increase in right ventricular wall thickness. 4. Left atrial size was moderately dilated. 5. Right atrial size was mildly dilated. 6. Trivial pericardial effusion is present. 7. The mitral valve is normal in structure. Trace mitral valve regurgitation. 8. The tricuspid valve is normal in structure. Tricuspid valve regurgitation is mild. 9. The aortic valve is tricuspid. Aortic valve regurgitation is not visualized. 10. The pulmonic valve was not well visualized. Pulmonic valve regurgitation is not visualized. 11. The tricuspid regurgitant velocity is 2.79 m/s, and with an assumed right atrial pressure of 8 mmHg, the estimated right ventricular systolic pressure is mildly elevated at 39.1 mmHg. 12. The inferior vena cava is normal in size with <50% respiratory variability, suggesting right atrial pressure of 8 mmHg. 13. The interatrial septum was not well visualized.  Patient Profile     73 y.o. female with a history of persistent atrial fibrillation on Eliquis, chronic diastolic CHF, hypertension, unilateral congential absence of kidney with CKD stage 5 with prolonged hospitalization. She was admitted to ICU on 10/22 after witnessed cardiac arrest requiring intubation. She was then noted to be in atrial fibrillation with RVR and placed on IV Amiodarone and IV Heparin. She was subsequently able to be extubated. Later found to have MSSA bacteremia and multiple acute infarcts on brain MRI on 11/7. Cardiology currently following for recurrent atrial fibrillation with RVR.  Assessment & Plan    Atrial Fibrillation with RVR -Seems reasonably rate controlled with amiodarone now p.o./NG  -Continue with Coumadin per pharmacy -No plans for [facility] -  Seems hemodynamically stable.  When able, she can be transferred to rehab center.  Post Cardiac Arrest - Patient initially presented with out of hospital cardiac arrest s/p cooling protocol. LHC initially deferred due to renal function and in an attempt to avoid HD. - EF initially down to 20-25% on 08/03/2019 but improved to 55-60% on repeat Echo on 08/21/2019.  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  Volume management per HD  MSSA Bacteremia/Endocarditis - TEE on 08/09/2019 showed small filamentous mass on the aortic side of the left coronary cusp most consistent with vegetation given clinical picture of staph bacteremia. - Continue IV antibiotics per ID.  ESRD - Started on intermittent hemodialysis this admission. - Management  per Nephrology. Cr improved to 2.42 yesterday   For questions or updates, please contact [facility] Please consult [email or web address] for contact info under    Will sign off patient awaiting rehab placement will arrange outpatient f/u with Dr Meda Coffee    Signed, Jenkins Rouge, MD 09/03/2019, 8:19 AM

## 2019-09-03 NOTE — Progress Notes (Signed)
PROGRESS NOTE    Jaclyn Day  [id number] DOB: 03-Jun-1946 DOA: 08/03/2019 PCP: Cyndi Bender, PA-C   Brief Narrative: 73 year old past medical history significant for congenital unilateral kidney, stage IV chronic kidney disease with baseline creatinine about 2.2--2.8, hypertension, persistent A. fib was on Eliquis, chronic diastolic heart failure, history of cardioversion for A. fib admitted to ICU after witnessed cardiac arrest.  On 10/22 patient was admitted after cardiac arrest, intubated, initiation of targeted temperature management 36 Celsius.  Echo showed left ventricular ejection fraction 20 to 25%, global hypokinesis, RV mildly reduced function with normal size. On 10/25: Blood cultures came back positive for staph.  She was a started on vancomycin. Patient was a started on amiodarone drip, she is off of Levophed and neo-.  She was a started on heparin drip, which was subsequently discontinued.  She was also started on hemodialysis.  Patient has been transitioned from amiodarone drip to oral amiodarone.  She was started on Coumadin. 10/26: Patient was started on Precedex for agitation, femoral CVL was discontinued.    Assessment & Plan:   Principal Problem:   MSSA bacteremia Active Problems:   Cardiac arrest ([place])   Pressure injury of skin   Acute respiratory failure with hypoxia (HCC)   AKI (acute kidney injury) (HCC)   Elevated troponin   Persistent atrial fibrillation (HCC)   Acute on chronic combined systolic and diastolic CHF (congestive heart failure) (HCC)   Acute bacterial endocarditis   Malnutrition of moderate degree   Advanced care planning/counseling discussion   Goals of care, counseling/discussion   Palliative care by specialist   Closed fracture of multiple ribs   Cerebral embolism with cerebral infarction  1-Acute hypoxic respiratory failure due to cardiac arrest and pulmonary edema: Bilateral pleural effusion: Patient presented after outside  hospital cardiac arrest Patient was intubated and is status post extubation, on nasal cannula oxygen Nephrology managing volume status via hemodialysis Patient was more  tachypnic 11/19, chest x ray stable from before. She gets anxious and hyperventilate.  Respiratory status stable. Stable on 2 L.   2-Acute metabolic encephalopathy, suspect multifactorial with recent multiple acute CVA -Setting of cardiac arrest, hypothermia, MSSA bacteremia, prolonged hospitalization. CT head 11/11 stable Mental status is slowly improving. Sleepy at times.  On valproic acid continue with as long standing medication EEG negative for seizure Palliative care was consulted. MRI on 08/19/2019 showed several scattered small acute infarct in the white matter of the superior frontal lobe, left occipital lobe.  Several small subacute chronic infarct in bilateral cerebellum. Neurology help with patient care. TTE no vegetation or thrombus. She is more alert, but overall very deconditioned and weak.   3-Hemodialysis catheter side related: 11/15 IR saw the patient, status post 10 cc 1% lidocaine with epinephrine was injected into subcutaneous tract Stable  4-Multiple acute subacute CVA involving white matter: Patient was on heparin drip.  Neurology was okay with anticoagulation. On coumadin. Pharmacy managing.   5-Witnessed cardiac arrest in the setting of chronic A. Fib: Patient treated with hypothermia protocol Left heart cath initially deferred due to acute on chronic renal failure Ejection fraction initially down to 20 to 25% on 10/20-second/2020, improved to 55 to 60% on echo 11/90/2020. Follow cardiology recommendation in regard ischemic evaluation  6-A. fib with RVR: Cardiology was following. Follow-up with cardiology for further evaluation and consideration of TEE and DCCV Heparin on hold due to anemia. On coumadin, no bridge due to concern for bleeding.  Amiodarone change to oral.  No plan  for  inpatient cardioversion per cardiology.   7-Acute on chronic systolic heart failure: TTE on 10/22 with EF 20%.  Repeat TEE with improved ejection fraction to 60%.  Volume management with hemodialysis.  8-MSSA bacteremia; and endocarditis, Vegetation AV  Infectious vs fibrous tissue.  Repeat blood cultures negative. ID recommended cefazolin until 12/7. Treating for 6 weeks.    9-Acute blood loss anemia: No sign of overt bleeding, she had bled from hemodialysis catheter on 11/15. Status post transfuse 1 unit of packed red blood cell. Hs has remain 8 range.   10-AKI on chronic kidney disease a stage IV: Suspect AKI likely ATN in the setting of shock, cardiac arrest bacteremia. She was initially started on CRRT 10/30---11/1 Currently getting hemodialysis. Too weak to undergoing AVF or  AV graft  11-Hyperkalemia: Resolved  12-Nutrition: On tube feeding via core track tube He was a started on dysphagia 1 diet. He was transitioned to nocturnal feeding Osmolite. Monitor oral intake to determine removal of tube feeding Will ask nutritionist to follow on calory count, to titrate tube feedings.   13-oral thrush; started nystatin. 14-Leukocytosis;  On IV antibiotics. Chest x ray stable. Follow trend. .  Blood culture 11-20: no growth to date.  Nurse to get UA, in and out cath.   Vitamin D deficiency; replete for 7 day repeat level.   Nutrition Problem: Moderate Malnutrition Etiology: acute illness(acute respiratory failure, AKI required CRRT, endocarditis)    Signs/Symptoms: mild fat depletion, mild muscle depletion, moderate muscle depletion    Interventions: Tube feeding  Estimated body mass index is 26.69 kg/m as calculated from the following:   Height as of this encounter: 5\' 6"  (1.676 m).   Weight as of this encounter: 75 kg.   DVT prophylaxis: Heparin Code Status: Full code Family Communication: [name] at bedside.  Disposition Plan: Remain in the hospital for  amiodarone infusion, antibiotics, hemodialysis. Peer to peer reviewed today for LTAC was decline.  Consultants:  Nephrology IR Neurology Palliative care Cardiology  Procedures:  placement of tunneled HD catheter, CRRT, HD  Antimicrobials:  Rocephin 10/22 >> 10/25 Vanc (staph in blood ) 10/24  Cefazolin 10/25 >>  Subjective: Alert, wants to eat breakfast. Denies dyspnea.   Objective: Vitals:   09/02/19 2005 09/02/19 2038 09/03/19 0011 09/03/19 0351  BP: (!) 125/97  108/69 123/73  Pulse: (!) 110 98 94 (!) 105  Resp: (!) 36 (!) 24 (!) 25 (!) 25  Temp: 98.2 F (36.8 C)  98.8 F (37.1 C) 99.3 F (37.4 C)  TempSrc: Oral  Oral Oral  SpO2: 100% 100% 100% 100%  Weight:    75 kg  Height:        Intake/Output Summary (Last 24 hours) at 09/03/2019 [id number] Last data filed at 09/03/2019 0300 Gross per 24 hour  Intake 1837 ml  Output 3478 ml  Net -1641 ml   Filed Weights   09/02/19 1341 09/02/19 1747 09/03/19 0351  Weight: 76.8 kg 73.2 kg 75 kg    Examination:  General exam: No acute distress alert chronic ill-appearing core track in place Respiratory system: Decreased breath sounds crackles at the bases Cardiovascular system: 1 S2 regular rhythm and rate Gastrointestinal system: Bowel sounds present, soft nontender nondistended Central nervous system: He is alert, answer question, generalized weak. Extremities: Edema all 4 extremity worse on the left upper arm Skin: No rash     Data Reviewed: I have personally reviewed following labs and imaging studies  CBC: Recent Labs  Lab 08/30/19 0422 08/31/19  0403 09/01/19 0354 09/02/19 0254 09/03/19 0325  WBC 15.7* 19.1* 19.1* 19.8* 19.8*  HGB 7.8* 7.8* 7.3* 8.0* 7.8*  HCT 23.9* 24.9* 23.8* 25.9* 25.7*  MCV 87.9 90.5 92.2 91.5 92.8  PLT 312 356 301 334 [id number]   Basic Metabolic Panel: Recent Labs  Lab 08/29/19 0312  08/30/19 0832  08/31/19 1300 09/01/19 0354 09/02/19 0254 09/02/19 1304 09/03/19 0325  NA 135  --   139  --  135 136  --  136  --   K 5.5*  --  4.7  --  5.2* 4.1  --  5.4*  --   CL 93*  --  99  --  93* 96*  --  97*  --   CO2 25  --  28  --  27 27  --  27  --   GLUCOSE 142*  --  135*  --  138* 133*  --  137*  --   BUN 55*  --  27*  --  44* 23  --  48*  --   CREATININE 4.00*  --  2.85*  --  4.08* 2.42*  --  3.75*  --   CALCIUM 9.2  --  9.0  --  9.1 8.9  --  9.3  --   PHOS 4.0   < >  --    < > 3.9 3.3 4.0 4.0 3.0   < > = values in this interval not displayed.   GFR: Estimated Creatinine Clearance: 14 mL/min (A) (by C-G formula based on SCr of 3.75 mg/dL (H)). Liver Function Tests: Recent Labs  Lab 08/31/19 1300 09/01/19 0354 09/02/19 1304  ALBUMIN 1.4* 1.4* 1.6*   No results for input(s): LIPASE, AMYLASE in the last 168 hours. Recent Labs  Lab 08/31/19 0403  AMMONIA 22   Coagulation Profile: Recent Labs  Lab 08/31/19 0403 09/01/19 0354 09/02/19 0254 09/03/19 0325  INR 1.2 1.8* 3.4* 2.3*   Cardiac Enzymes: No results for input(s): CKTOTAL, CKMB, CKMBINDEX, TROPONINI in the last 168 hours. BNP (last 3 results) No results for input(s): PROBNP in the last 8760 hours. HbA1C: No results for input(s): HGBA1C in the last 72 hours. CBG: Recent Labs  Lab 09/02/19 1228 09/02/19 2005 09/03/19 0010 09/03/19 0413 09/03/19 0817  GLUCAP 111* 125* 159* 125* 122*   Lipid Profile: No results for input(s): CHOL, HDL, LDLCALC, TRIG, CHOLHDL, LDLDIRECT in the last 72 hours. Thyroid Function Tests: No results for input(s): TSH, T4TOTAL, FREET4, T3FREE, THYROIDAB in the last 72 hours. Anemia Panel: No results for input(s): VITAMINB12, FOLATE, FERRITIN, TIBC, IRON, RETICCTPCT in the last 72 hours. Sepsis Labs: No results for input(s): PROCALCITON, LATICACIDVEN in the last 168 hours.  Recent Results (from the past 240 hour(s))  Culture, blood (routine x 2)     Status: None   Collection Time: 08/28/19 12:00 PM   Specimen: BLOOD  Result Value Ref Range Status   Specimen  Description BLOOD BLOOD LEFT HAND  Final   Special Requests   Final    AEROBIC BOTTLE ONLY Blood Culture results may not be optimal due to an inadequate volume of blood received in culture bottles   Culture   Final    NO GROWTH 5 DAYS Performed at [facility], [place] [address], [place], [place] [address]    Report Status 09/02/2019 FINAL  Final  Culture, blood (routine x 2)     Status: None   Collection Time: 08/28/19 12:00 PM   Specimen: BLOOD  Result  Value Ref Range Status   Specimen Description BLOOD LEFT ANTECUBITAL  Final   Special Requests   Final    AEROBIC BOTTLE ONLY Blood Culture results may not be optimal due to an inadequate volume of blood received in culture bottles   Culture   Final    NO GROWTH 5 DAYS Performed at [facility] [address], [place], [place] [address]    Report Status 09/02/2019 FINAL  Final  Culture, blood (routine x 2)     Status: None (Preliminary result)   Collection Time: 09/01/19  4:26 PM   Specimen: BLOOD LEFT HAND  Result Value Ref Range Status   Specimen Description BLOOD LEFT HAND  Final   Special Requests   Final    BOTTLES DRAWN AEROBIC ONLY Blood Culture adequate volume   Culture   Final    NO GROWTH < 24 HOURS Performed at [facility], [place] [address], [place], [place] [address]    Report Status PENDING  Incomplete  Culture, blood (routine x 2)     Status: None (Preliminary result)   Collection Time: 09/01/19  4:26 PM   Specimen: BLOOD LEFT HAND  Result Value Ref Range Status   Specimen Description BLOOD LEFT HAND  Final   Special Requests   Final    BOTTLES DRAWN AEROBIC ONLY Blood Culture adequate volume   Culture   Final    NO GROWTH < 24 HOURS Performed at [facility], [place] [address] [place], [place] [address]    Report Status PENDING  Incomplete         Radiology Studies: No results found.      Scheduled Meds: .  stroke: mapping our early stages of recovery book   Does not apply  Once  . amiodarone  200 mg Oral Daily  . ARIPiprazole  10 mg Oral BID  . atorvastatin  20 mg Per NG tube Daily  . chlorhexidine  15 mL Mouth Rinse BID  . Chlorhexidine Gluconate Cloth  6 each Topical Q0600  . Chlorhexidine Gluconate Cloth  6 each Topical Q0600  . cholecalciferol  1,000 Units Oral Daily  . darbepoetin (ARANESP) injection - DIALYSIS  100 mcg Intravenous Q Thu-HD  . DULoxetine  30 mg Oral Daily  . feeding supplement (NEPRO CARB STEADY)  237 mL Oral BID BM  . ferrous sulfate  300 mg Per Tube Q breakfast  . insulin aspart  0-9 Units Subcutaneous Q4H  . mouth rinse  15 mL Mouth Rinse BID  . midodrine  10 mg Per Tube TID WC  . multivitamin  1 tablet Oral QHS  . nystatin  5 mL Oral QID  . pantoprazole sodium  40 mg Per Tube Daily  . sodium chloride flush  10-40 mL Intracatheter Q12H  . thiamine injection  100 mg Intravenous Daily  . Thrombi-Pad  1 each Topical Once  . valproic acid  250 mg Per Tube Q6H  . Warfarin - Pharmacist Dosing Inpatient   Does not apply q1800   Continuous Infusions: . sodium chloride 10 mL (08/20/19 2025)  . sodium chloride    . sodium chloride    .  ceFAZolin (ANCEF) IV 2 g (09/02/19 1830)  . feeding supplement (OSMOLITE 1.5 CAL) 1,000 mL (08/31/19 2025)     LOS: 31 days    Time spent: 35 minutes.     Elmarie Shiley, MD [facility]  If 7PM-7AM, please contact night-coverage [email or web address] Password [facility] 09/03/2019, 8:32  AM  

## 2019-09-03 NOTE — Progress Notes (Signed)
Unable to obtain UA this shift. Bladder scan shows only 80 mL urine in bladder at 1600. Will report to night shift RN.

## 2019-09-03 NOTE — Progress Notes (Signed)
Nutrition Follow-up  DOCUMENTATION CODES:   Non-severe (moderate) malnutrition in context of acute illness/injury  INTERVENTION:   -Continue Calorie Count - Please document in flowsheets, placed instructions in order  -Will trial Boost Breeze TID -Nepro Shake po BID, each supplement provides 425 kcal and 19 grams protein -Rena-Vit daily  Nocturnal feedings: -Decrease Osmolite 1.5 to 64ml/hr via cortrak over 12 hour period(2000-0800) Tube feeding regimen provides540kcal (30% of needs),22grams of protein, and 233ml of H2O.  NUTRITION DIAGNOSIS:   Moderate Malnutrition related to acute illness(acute respiratory failure, AKI required CRRT, endocarditis) as evidenced by mild fat depletion, mild muscle depletion, moderate muscle depletion.  Ongoing.  GOAL:   Patient will meet greater than or equal to 90% of their needs  Not meeting with PO.  MONITOR:   TF tolerance, Diet advancement, Labs, Weight trends, Skin, PO intakes, Supplemental acceptance  REASON FOR ASSESSMENT:   Consult Assessment of nutrition requirement/status, Calorie Count  ASSESSMENT:   73 year old female who presented to the ED on 10/22 after cardiac arrest. PMH anemia, CHF, CKD stage IV, HTN, IDDM. Pt required intubation in the ED. TTM initiated.  10/22 -Admit, Intubated, normothermia TTM 10/24 - rewarmed, TF initiated 10/25 - HD catheter placed, first HD 10/27 - HD 10/28 - s/p TEE suspicious for vegetation 10/30 - CRRT initiated 11/01 - CRRT discontinued, extubated 11/03 -Cortrak tube placed, TF initiated 11/11- s/p HD cath placement 11/13- s/p MBSS- advanced to dysphagia 1 diet with thin liquids  **RD working remotely**  RD consulted for calorie count to determine if patient able to be weaned from tube feedings on 11/19.  Pt has been eating well on 11/16 and showed ability to meet estimated needs at that time via PO.  PO intakes on 11/8-11/19 ranged from 20-30%.  Per RD note on 11/20, pt  had not received any trays or supplements so nothing consumed for breakfast or lunch that day.  Per SLP note on 11/20, pt was eating during evaluation but exhibited some anterior spillage and was talking with mouth full of food. Pt has expressed the desire to eat breakfast to MD, per notes. Patient is not drinking Nepro consistently. Will trial Boost Breeze as well to see if this is better accepted.  No documentation of PO for 11/20 and 11/21 available in chart. No answer from patient on the phone today and have not heard back from RN regarding POs (pt's RN for today did not have pt the previous days).   Re-ordered Calorie Count for another 24 hours to ensure that pt can have TF turned off completely. RD will be working remotely 11/23, so documentation in flowsheets is easier to obtain.  Will decrease tube feeding rate as this may increase pt's appetite.   Admission weight: 192 lbs. Current weight: 165 lbs. I/Os: -5.1L since admit Last HD 11/21: 3478 ml UF  Medications: Vitamin D tablet, Ferrous sulfate, Rena-Vit tablet, IV Thiamine Labs reviewed: CBGs: 122-125 Phos WNL  Diet Order:   Diet Order            DIET - DYS 1 Room service appropriate? Yes; Fluid consistency: Thin  Diet effective now              EDUCATION NEEDS:   Not appropriate for education at this time  Skin:  Skin Assessment: Reviewed RN Assessment Skin Integrity Issues:: DTI DTI: -  Last BM:  11/20  Height:   Ht Readings from Last 1 Encounters:  08/03/19 5\' 6"  (1.676 m)    Weight:  Wt Readings from Last 1 Encounters:  09/03/19 75 kg    Ideal Body Weight:  59.1 kg  BMI:  Body mass index is 26.69 kg/m.  Estimated Nutritional Needs:   Kcal:  [id number]  Protein:  89-105  Fluid:  >/= 1.5 L  Clayton Bibles, MS, RD, LDN Inpatient Clinical Dietitian Pager: [phone] After Hours Pager: [phone]

## 2019-09-03 NOTE — Progress Notes (Signed)
ANTICOAGULATION CONSULT NOTE - Follow-Up Consult  Pharmacy Consult for Warfarin Indication: atrial fibrillation  Allergies  Allergen Reactions  . Codeine Other (See Comments)    Reaction not recalled by family- was told to never take this    Patient Measurements: Height: 5\' 6"  (167.6 cm) Weight: 165 lb 5.5 oz (75 kg) IBW/kg (Calculated) : 59.3  Vital Signs: Temp: 99.3 F (37.4 C) (11/22 0351) Temp Source: Oral (11/22 0351) BP: 123/73 (11/22 0351) Pulse Rate: 105 (11/22 0351)  Labs: Recent Labs    08/31/19 1300  09/01/19 0354 09/02/19 0254 09/02/19 1304 09/03/19 0325  HGB  --    < > 7.3* 8.0*  --  7.8*  HCT  --   --  23.8* 25.9*  --  25.7*  PLT  --   --  301 334  --  357  LABPROT  --   --  20.6* 34.3*  --  25.1*  INR  --   --  1.8* 3.4*  --  2.3*  CREATININE 4.08*  --  2.42*  --  3.75*  --    < > = values in this interval not displayed.    Estimated Creatinine Clearance: 14 mL/min (A) (by C-G formula based on SCr of 3.75 mg/dL (H)).   Medical History: Past Medical History:  Diagnosis Date  . Anemia   . Chronic diastolic CHF (congestive heart failure) ([place])   . CKD (chronic kidney disease), stage IV ([place])   . Hypertension   . Persistent atrial fibrillation ([place])    a. dx 01/2019 s/p TEE DCCV.  Marland Kitchen Renal disorder   . Unilateral congenital absence of kidney    Assessment: Pt is a 73 yr old female admitted s/p out of hospital cardiac arrest and TTM. Admit c/b by encephalopathy, AKI which progressed to HD, and ?embolic CVA. Pt on apixaban PTA for Afib, which was held for IV heparin. Heparin subsequently held on 11/16 due to bleeding from HD lines and low Hgb. Warfarin started 11/18 with no bridge planned. Patient at amiodarone PO with no plans to cardiovert.  INR therapeutic today at 2.3 after holding dose yesterday 11/21. CBC stable, no bleeding reported.   Goal of Therapy:  INR 2-3 Monitor platelets by anticoagulation protocol: Yes   Plan:  Warfarin 2.5 mg  x1 Daily INR Monitor for signs and symptoms of bleed   Thank you for allowing pharmacy to participate in this patient's care.  Tabithia Stroder L. Devin Going, [place] PGY1 Pharmacy Resident [phone] 09/03/19      8:30 AM  Please check AMION for all [place] phone numbers After 10:00 PM, call the [place] [phone]

## 2019-09-04 DIAGNOSIS — B9561 Methicillin susceptible Staphylococcus aureus infection as the cause of diseases classified elsewhere: Secondary | ICD-10-CM | POA: Diagnosis not present

## 2019-09-04 DIAGNOSIS — R7881 Bacteremia: Secondary | ICD-10-CM | POA: Diagnosis not present

## 2019-09-04 LAB — CBC
HCT: 25.9 % — ABNORMAL LOW (ref 36.0–46.0)
Hemoglobin: 7.9 g/dL — ABNORMAL LOW (ref 12.0–15.0)
MCH: 28.3 pg (ref 26.0–34.0)
MCHC: 30.5 g/dL (ref 30.0–36.0)
MCV: 92.8 fL (ref 80.0–100.0)
Platelets: 331 10*3/uL (ref 150–400)
RBC: 2.79 MIL/uL — ABNORMAL LOW (ref 3.87–5.11)
RDW: 16.6 % — ABNORMAL HIGH (ref 11.5–15.5)
WBC: 16 10*3/uL — ABNORMAL HIGH (ref 4.0–10.5)
nRBC: 0 % (ref 0.0–0.2)

## 2019-09-04 LAB — RENAL FUNCTION PANEL
Albumin: 1.6 g/dL — ABNORMAL LOW (ref 3.5–5.0)
Anion gap: 9 (ref 5–15)
BUN: 47 mg/dL — ABNORMAL HIGH (ref 8–23)
CO2: 29 mmol/L (ref 22–32)
Calcium: 9.2 mg/dL (ref 8.9–10.3)
Chloride: 98 mmol/L (ref 98–111)
Creatinine, Ser: 3.81 mg/dL — ABNORMAL HIGH (ref 0.44–1.00)
GFR calc Af Amer: 13 mL/min — ABNORMAL LOW (ref >60)
GFR calc non Af Amer: 11 mL/min — ABNORMAL LOW (ref >60)
Glucose, Bld: 96 mg/dL (ref 70–99)
Phosphorus: 4.6 mg/dL (ref 2.5–4.6)
Potassium: 5.6 mmol/L — ABNORMAL HIGH (ref 3.5–5.1)
Sodium: 136 mmol/L (ref 135–145)

## 2019-09-04 LAB — GLUCOSE, CAPILLARY
Glucose-Capillary: 108 mg/dL — ABNORMAL HIGH (ref 70–99)
Glucose-Capillary: 112 mg/dL — ABNORMAL HIGH (ref 70–99)
Glucose-Capillary: 116 mg/dL — ABNORMAL HIGH (ref 70–99)
Glucose-Capillary: 118 mg/dL — ABNORMAL HIGH (ref 70–99)
Glucose-Capillary: 129 mg/dL — ABNORMAL HIGH (ref 70–99)
Glucose-Capillary: 157 mg/dL — ABNORMAL HIGH (ref 70–99)
Glucose-Capillary: 90 mg/dL (ref 70–99)

## 2019-09-04 LAB — PROTIME-INR
INR: 2 — ABNORMAL HIGH (ref 0.8–1.2)
Prothrombin Time: 22.8 seconds — ABNORMAL HIGH (ref 11.4–15.2)

## 2019-09-04 LAB — PHOSPHORUS: Phosphorus: 4.6 mg/dL (ref 2.5–4.6)

## 2019-09-04 MED ORDER — HEPARIN SODIUM (PORCINE) 1000 UNIT/ML IJ SOLN
INTRAMUSCULAR | Status: AC
  Filled 2019-09-04: qty 4

## 2019-09-04 MED ORDER — CEFAZOLIN SODIUM-DEXTROSE 2-4 GM/100ML-% IV SOLN
2.0000 g | INTRAVENOUS | Status: AC
  Administered 2019-09-04 – 2019-09-06 (×2): 2 g via INTRAVENOUS
  Filled 2019-09-04 (×3): qty 100
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
## 2019-09-04 NOTE — Progress Notes (Signed)
PHARMACY NOTE:  ANTIMICROBIAL RENAL DOSAGE ADJUSTMENT  Current antimicrobial regimen includes a mismatch between antimicrobial dosage and estimated renal function.  As per policy approved by the Pharmacy & Therapeutics and Medical Executive Committees, the antimicrobial dosage will be adjusted accordingly.  Current antimicrobial dosage:  Cefazolin 2g IV qHD TTS  Indication: MSSA bacteremia  Renal Function:  Estimated Creatinine Clearance: 13.9 mL/min (A) (by C-G formula based on SCr of 3.75 mg/dL (H)). [x]      On intermittent HD, scheduled: TTS >> off schedule this week due to holiday []      On CRRT    Antimicrobial dosage has been changed to:  Cefazolin 2g IV x1 qHD MWF for now  Additional comments: Pharmacy will follow closely and adjust cefazolin dosing as HD schedule changes this week.   Thank you for allowing pharmacy to be a part of this patient's care.  Einar Grad, [facility] 09/04/2019 1:31 PM

## 2019-09-04 NOTE — Progress Notes (Signed)
[facility] Progress Note    Assessment/ Plan:   #dialysis dep't AoCKD4: CKD due to hypertension.  AKI likely ATN in the setting of shock, cardiac arrest and bacteremia.  She was on CRRT 10/30-11/1.   -Kidney ultrasound showed solitary right kidney with cortical thinning and cysts. -Seen by palliative, full scope of care, potentially to LTAC?  - s/p TDC with IR 11/11 - Normally iHD T Th Sat Schedule:  note Next HD 11/23 today to accommodate for the holiday schedule - cont to eval for possible AVF or AVG, too debilitated currently - given renal failure would transition off of cymbalta per primary team   #Hyperkalemia: on HD.  Labs ordered  #Acute encephalopathy: somewhat improved but still present.  11/7 MRI with scattered small acute infarcts and chronic infarcts noted.  #Cardiac arrest, acute on chronic systolic CHF: EF [id number] by echo, cardiology is following.  EF improved in TEE.  #MSSA bacteremia: Continue cefazolin. End date six weeks from last negative culture 08/07/19 - 09/18/19.  #Hypotension: Stable.  Continue midodrine.  #Acute respiratory failure with hypoxia: HD with UF. Continue supportive care.   #Paroxysmal atrial fibrillation: Cardiology following.  On amiodarone. Anticoagulation per primary   #Anemia: hgb 5.9 11/15 in setting of TDC ooze- IR has seen and stitched, Got 1 u PRBCs, started Aranesp 100 q Thursday   #Dispo: ? LTACH  Subjective:    Seen on HD this afternoon at 2:36 pm.  Treatment supervised.  116/44 and HR 70 on HD.  Tolerating goal.     Objective:   BP 123/63 (BP Location: Left Arm)   Pulse 90   Temp 97.7 F (36.5 C) (Oral)   Resp (!) 30   Ht 5\' 6"  (1.676 m)   Wt 73.3 kg   SpO2 100%   BMI 26.08 kg/m   Intake/Output Summary (Last 24 hours) at 09/04/2019 1441 Last data filed at 09/04/2019 [id number] Gross per 24 hour  Intake 660 ml  Output 0 ml  Net 660 ml   Weight change: -3.5 kg  Physical Exam: General: NAD, awake   HEENT: NCAT sclera anicteric Heart: s1s2 nl, no rubs Lungs: clear anteriorly unlabored  Abdomen:soft, Non-tender, non-distended Extremities: no pitting edema lower extremities  Dialysis Access: [facility] c/d/i  Imaging: No results found.  Labs: BMET Recent Labs  Lab 08/29/19 0312  08/30/19 [id number] 08/31/19 0403 08/31/19 1300 09/01/19 0354 09/02/19 0254 09/02/19 1304 09/03/19 0325 09/04/19 0608  NA 135  --  139  --  135 136  --  136  --   --   K 5.5*  --  4.7  --  5.2* 4.1  --  5.4*  --   --   CL 93*  --  99  --  93* 96*  --  97*  --   --   CO2 25  --  28  --  27 27  --  27  --   --   GLUCOSE 142*  --  135*  --  138* 133*  --  137*  --   --   BUN 55*  --  27*  --  44* 23  --  48*  --   --   CREATININE 4.00*  --  2.85*  --  4.08* 2.42*  --  3.75*  --   --   CALCIUM 9.2  --  9.0  --  9.1 8.9  --  9.3  --   --   PHOS 4.0   < >  --  3.8 3.9 3.3 4.0 4.0 3.0 4.6   < > = values in this interval not displayed.   CBC Recent Labs  Lab 09/01/19 0354 09/02/19 0254 09/03/19 0325 09/04/19 0608  WBC 19.1* 19.8* 19.8* 16.0*  HGB 7.3* 8.0* 7.8* 7.9*  HCT 23.8* 25.9* 25.7* 25.9*  MCV 92.2 91.5 92.8 92.8  PLT 301 334 357 331    Medications:    .  stroke: mapping our early stages of recovery book   Does not apply Once  . amiodarone  200 mg Oral Daily  . ARIPiprazole  10 mg Oral BID  . atorvastatin  20 mg Per NG tube Daily  . chlorhexidine  15 mL Mouth Rinse BID  . Chlorhexidine Gluconate Cloth  6 each Topical Q0600  . Chlorhexidine Gluconate Cloth  6 each Topical Q0600  . cholecalciferol  1,000 Units Oral Daily  . darbepoetin (ARANESP) injection - DIALYSIS  100 mcg Intravenous Q Thu-HD  . DULoxetine  30 mg Oral Daily  . feeding supplement  1 Container Oral TID BM  . feeding supplement (NEPRO CARB STEADY)  237 mL Oral BID BM  . ferrous sulfate  300 mg Per Tube Q breakfast  . insulin aspart  0-9 Units Subcutaneous Q4H  . mouth rinse  15 mL Mouth Rinse BID  . midodrine  10 mg Per  Tube TID WC  . multivitamin  1 tablet Oral QHS  . nystatin  5 mL Oral QID  . pantoprazole sodium  40 mg Per Tube Daily  . sodium chloride flush  10-40 mL Intracatheter Q12H  . thiamine injection  100 mg Intravenous Daily  . Thrombi-Pad  1 each Topical Once  . valproic acid  250 mg Per Tube Q6H  . Warfarin - Pharmacist Dosing Inpatient   Does not apply q1800    Claudia Desanctis 09/04/2019, 2:41 PM

## 2019-09-04 NOTE — Progress Notes (Signed)
Calorie Count Note  24 hour calorie count ordered. Results for 11/22  Diet: Dysphagia 1  Supplements:  -Boost Breeze po TID, each supplement provides 250 kcal and 9 grams of protein -Nepro Shake po BID, each supplement provides 425 kcal and 19 grams protein  11/22: Breakfast: 120 kcals, 4g protein Lunch: 422 kcals, 20g protein Dinner: not documented -suspect there was some intake Supplements: 925 kcals, 37g protein (2 Boost Breeze, 1 Nepro)  Total intake: 1467 kcal (82% of minimum estimated needs)  61g protein (68% of minimum estimated needs)  *Patient consumed 25% of breakfast this morning (11/23) with staff assistance. Providing ~200 kcals and 9g protein.  Nutrition Dx: Moderate Malnutrition related to acute illness(acute respiratory failure, AKI required CRRT, endocarditis) as evidenced by mild fat depletion, mild muscle depletion, moderate muscle depletion  Goal: Pt to meet >/= 90% of their estimated nutrition needs   Intervention:  -D/c Calorie Count -Recommend d/c TF as pt is eating better and may eat more if TF are off at night -Continue Nepro and Boost Breeze supplements -Will continue to monitor PO intakes  Clayton Bibles, MS, RD, LDN Inpatient Clinical Dietitian Pager: [phone] After Hours Pager: [phone]

## 2019-09-04 NOTE — TOC Progression Note (Signed)
Transition of Care [facility]) - Progression Note    Patient Details  Name: Jaclyn Day MRN: [id number] Date of Birth: January 25, 1946  [facility] CM/SW Contact  Graves-Bigelow, Ocie Cornfield, RN Phone Number: 09/04/2019, 11:32 AM  Clinical Narrative:   [facility]. CM will continue to follow for transition of care needs.   Expected Discharge Plan: Long Term Acute Care (LTAC) Barriers to Discharge: Continued Medical Work up  Expected Discharge Plan and Services Expected Discharge Plan: [place] (LTAC) In-house Referral: NA Discharge Planning Services: CM Consult Post Acute Care Choice: [place] Living arrangements for the past 2 months: Single Family Home                   Social Determinants of Health (SDOH) Interventions    Readmission Risk Interventions Readmission Risk Prevention Plan 08/08/2019  Transportation Screening Complete  PCP or Specialist Appt within 3-5 Days Complete  HRI or Home Care Consult Complete  Medication Review (RN Care Manager) Complete  Some recent data might be hidden

## 2019-09-04 NOTE — Progress Notes (Signed)
ANTICOAGULATION CONSULT NOTE - Follow-Up Consult  Pharmacy Consult for Warfarin Indication: atrial fibrillation  Allergies  Allergen Reactions  . Codeine Other (See Comments)    Reaction not recalled by family- was told to never take this    Patient Measurements: Height: 5\' 6"  (167.6 cm) Weight: 161 lb 9.6 oz (73.3 kg) IBW/kg (Calculated) : 59.3  Vital Signs: Temp: 97.9 F (36.6 C) (11/23 0740) Temp Source: Oral (11/23 0740) BP: 128/69 (11/23 0740) Pulse Rate: 82 (11/23 0740)  Labs: Recent Labs    09/02/19 0254 09/02/19 1304 09/03/19 0325 09/04/19 0608  HGB 8.0*  --  7.8* 7.9*  HCT 25.9*  --  25.7* 25.9*  PLT 334  --  357 331  LABPROT 34.3*  --  25.1* 22.8*  INR 3.4*  --  2.3* 2.0*  CREATININE  --  3.75*  --   --     Estimated Creatinine Clearance: 13.9 mL/min (A) (by C-G formula based on SCr of 3.75 mg/dL (H)).   Medical History: Past Medical History:  Diagnosis Date  . Anemia   . Chronic diastolic CHF (congestive heart failure) ([place])   . CKD (chronic kidney disease), stage IV ([place])   . Hypertension   . Persistent atrial fibrillation ([place])    a. dx 01/2019 s/p TEE DCCV.  Marland Kitchen Renal disorder   . Unilateral congenital absence of kidney    Assessment: Pt is a 73 yr old female admitted s/p out of hospital cardiac arrest and TTM. Admit c/b by encephalopathy, AKI which progressed to HD, and ?embolic CVA. Pt on apixaban PTA for Afib, which was held for IV heparin. Heparin subsequently held on 11/16 due to bleeding from HD lines and low Hgb. Warfarin started 11/18 with no bridge planned. Patient at amiodarone PO with no plans for DCCV.  INR therapeutic at 2.0, H/H stable.   Goal of Therapy:  INR 2-3 Monitor platelets by anticoagulation protocol: Yes   Plan:  Warfarin 2.5 mg PO x1 tonight Daily INR   Arrie Senate, PharmD, BCPS Clinical Pharmacist [phone] Please check AMION for all [facility] Pharmacy numbers 09/04/2019

## 2019-09-04 NOTE — Progress Notes (Signed)
PROGRESS NOTE    Jaclyn Day  [id number] DOB: 1945-12-28 DOA: 08/03/2019 PCP: Cyndi Bender, PA-C   Brief Narrative: 73 year old past medical history significant for congenital unilateral kidney, stage IV chronic kidney disease with baseline creatinine about 2.2--2.8, hypertension, persistent A. fib was on Eliquis, chronic diastolic heart failure, history of cardioversion for A. fib admitted to ICU after witnessed cardiac arrest.  On 10/22 patient was admitted after cardiac arrest, intubated, initiation of targeted temperature management 36 Celsius.  Echo showed left ventricular ejection fraction 20 to 25%, global hypokinesis, RV mildly reduced function with normal size. On 10/25: Blood cultures came back positive for staph.  She was a started on vancomycin. Patient was a started on amiodarone drip, she is off of Levophed and neo-.  She was a started on heparin drip, which was subsequently discontinued.  She was also started on hemodialysis.  Patient has been transitioned from amiodarone drip to oral amiodarone.  She was started on Coumadin. 10/26: Patient was started on Precedex for agitation, femoral CVL was discontinued.    Assessment & Plan:   Principal Problem:   MSSA bacteremia Active Problems:   Cardiac arrest ([place])   Pressure injury of skin   Acute respiratory failure with hypoxia (HCC)   AKI (acute kidney injury) (HCC)   Elevated troponin   Persistent atrial fibrillation (HCC)   Acute on chronic combined systolic and diastolic CHF (congestive heart failure) (HCC)   Acute bacterial endocarditis   Malnutrition of moderate degree   Advanced care planning/counseling discussion   Goals of care, counseling/discussion   Palliative care by specialist   Closed fracture of multiple ribs   Cerebral embolism with cerebral infarction  1-Acute hypoxic respiratory failure due to cardiac arrest and pulmonary edema: Bilateral pleural effusion: Patient presented after outside  hospital cardiac arrest Patient was intubated and is status post extubation, on nasal cannula oxygen Nephrology managing volume status via hemodialysis Patient was more  tachypnic 11/19, chest x ray stable from before. She gets anxious and hyperventilate.  Respiratory status stable.   2-Acute metabolic encephalopathy, suspect multifactorial with recent multiple acute CVA -Setting of cardiac arrest, hypothermia, MSSA bacteremia, prolonged hospitalization. CT head 11/11 stable Mental status is slowly improving. Sleepy at times.  On valproic acid continue with as long standing medication EEG negative for seizure Palliative care was consulted. MRI on 08/19/2019 showed several scattered small acute infarct in the white matter of the superior frontal lobe, left occipital lobe.  Several small subacute chronic infarct in bilateral cerebellum. Neurology help with patient care. TTE no vegetation or thrombus. She is more alert, but overall very deconditioned and weak. She will required rehab.   3-Bleeding from Hemodialysis catheter side related: 11/15 IR saw the patient, status post 10 cc 1% lidocaine with epinephrine was injected into subcutaneous tract Stable  4-Multiple acute subacute CVA involving white matter: Patient was on heparin drip.  Neurology was okay with anticoagulation. On coumadin. Pharmacy managing.   5-Witnessed cardiac arrest in the setting of chronic A. Fib: Patient treated with hypothermia protocol Left heart cath initially deferred due to acute on chronic renal failure Ejection fraction initially down to 20 to 25% on 10/20-second/2020, improved to 55 to 60% on echo 11/90/2020. Follow cardiology recommendation in regard ischemic evaluation  6-A. fib with RVR: Cardiology was following. Follow-up with cardiology for further evaluation and consideration of TEE and DCCV Heparin on hold due to anemia. On coumadin, no bridge due to concern for bleeding.  Amiodarone change to  oral.  No plan for inpatient cardioversion per cardiology.   7-Acute on chronic systolic heart failure: TTE on 10/22 with EF 20%.  Repeat TEE with improved ejection fraction to 60%.  Volume management with hemodialysis.  8-MSSA bacteremia; and endocarditis, Vegetation AV  Infectious vs fibrous tissue.  Repeat blood cultures negative. ID recommended cefazolin until 12/7. Treating for 6 weeks.    9-Acute blood loss anemia: No sign of overt bleeding, she had bled from hemodialysis catheter on 11/15. Status post transfuse 1 unit of packed red blood cell. Hs has remain 8 range.   10-AKI on chronic kidney disease a stage IV: Suspect AKI likely ATN in the setting of shock, cardiac arrest bacteremia. She was initially started on CRRT 10/30---11/1 Currently getting hemodialysis. Too weak to undergoing AVF or  AV graft  11-Hyperkalemia: Resolved  12-Nutrition: On tube feeding via core track tube He was a started on dysphagia 1 diet. He was transitioned to nocturnal feeding Osmolite. Monitor oral intake to determine removal of tube feeding Nutrition to follow on calory count.   13-oral thrush; started nystatin. 14-Leukocytosis;  On IV antibiotics. Chest x ray stable. Follow trend. .  Blood culture 11-20: no growth to date.  WBC decreasing today.   Vitamin D deficiency; replete for 7 day repeat level.   Nutrition Problem: Moderate Malnutrition Etiology: acute illness(acute respiratory failure, AKI required CRRT, endocarditis)    Signs/Symptoms: mild fat depletion, mild muscle depletion, moderate muscle depletion    Interventions: Tube feeding  Estimated body mass index is 26.08 kg/m as calculated from the following:   Height as of this encounter: 5\' 6"  (1.676 m).   Weight as of this encounter: 73.3 kg.   DVT prophylaxis: Heparin Code Status: Full code Family Communication: [name] at bedside.  Disposition Plan: Remain in the hospital for  antibiotics, hemodialysis, very  deconditioning. She will benefit form LTAC Consultants:  Nephrology IR Neurology Palliative care Cardiology  Procedures:  placement of tunneled HD catheter, CRRT, HD  Antimicrobials:  Rocephin 10/22 >> 10/25 Vanc (staph in blood ) 10/24  Cefazolin 10/25 >>  Subjective: Alert, had episode of cough.  Denies dyspnea.   Objective: Vitals:   09/04/19 0253 09/04/19 0420 09/04/19 0740 09/04/19 1157  BP:  119/63 128/69 123/63  Pulse: 81 100 82 90  Resp: (!) 22 (!) 24 (!) 24 (!) 30  Temp:  98 F (36.7 C) 97.9 F (36.6 C) 97.7 F (36.5 C)  TempSrc:  Oral Oral Oral  SpO2: 100% 100% 100% 100%  Weight:  73.3 kg    Height:        Intake/Output Summary (Last 24 hours) at 09/04/2019 1501 Last data filed at 09/04/2019 [id number] Gross per 24 hour  Intake 660 ml  Output 0 ml  Net 660 ml   Filed Weights   09/02/19 1747 09/03/19 0351 09/04/19 0420  Weight: 73.2 kg 75 kg 73.3 kg    Examination:  General exam: NAD, Chronic ill appearing Respiratory system: Decreased breath sounds, crackles bases Cardiovascular system: S 1, S 2 IRR Gastrointestinal system: BS present, soft, nt Central nervous system: alert, answer question, generalized weakness Extremities: Edema all 4 extremities Skin: No rash     Data Reviewed: I have personally reviewed following labs and imaging studies  CBC: Recent Labs  Lab 08/31/19 0403 09/01/19 0354 09/02/19 0254 09/03/19 0325 09/04/19 0608  WBC 19.1* 19.1* 19.8* 19.8* 16.0*  HGB 7.8* 7.3* 8.0* 7.8* 7.9*  HCT 24.9* 23.8* 25.9* 25.7* 25.9*  MCV 90.5 92.2 91.5 92.8 92.8  PLT 356 301 334 357 [id number]   Basic Metabolic Panel: Recent Labs  Lab 08/29/19 0312  08/30/19 0832  08/31/19 1300 09/01/19 0354 09/02/19 0254 09/02/19 1304 09/03/19 0325 09/04/19 0608  NA 135  --  139  --  135 136  --  136  --   --   K 5.5*  --  4.7  --  5.2* 4.1  --  5.4*  --   --   CL 93*  --  99  --  93* 96*  --  97*  --   --   CO2 25  --  28  --  27 27  --  27  --    --   GLUCOSE 142*  --  135*  --  138* 133*  --  137*  --   --   BUN 55*  --  27*  --  44* 23  --  48*  --   --   CREATININE 4.00*  --  2.85*  --  4.08* 2.42*  --  3.75*  --   --   CALCIUM 9.2  --  9.0  --  9.1 8.9  --  9.3  --   --   PHOS 4.0   < >  --    < > 3.9 3.3 4.0 4.0 3.0 4.6   < > = values in this interval not displayed.   GFR: Estimated Creatinine Clearance: 13.9 mL/min (A) (by C-G formula based on SCr of 3.75 mg/dL (H)). Liver Function Tests: Recent Labs  Lab 08/31/19 1300 09/01/19 0354 09/02/19 1304  ALBUMIN 1.4* 1.4* 1.6*   No results for input(s): LIPASE, AMYLASE in the last 168 hours. Recent Labs  Lab 08/31/19 0403  AMMONIA 22   Coagulation Profile: Recent Labs  Lab 08/31/19 0403 09/01/19 0354 09/02/19 0254 09/03/19 0325 09/04/19 0608  INR 1.2 1.8* 3.4* 2.3* 2.0*   Cardiac Enzymes: No results for input(s): CKTOTAL, CKMB, CKMBINDEX, TROPONINI in the last 168 hours. BNP (last 3 results) No results for input(s): PROBNP in the last 8760 hours. HbA1C: No results for input(s): HGBA1C in the last 72 hours. CBG: Recent Labs  Lab 09/03/19 1935 09/04/19 0005 09/04/19 0417 09/04/19 0740 09/04/19 1153  GLUCAP 87 118* 116* 157* 112*   Lipid Profile: No results for input(s): CHOL, HDL, LDLCALC, TRIG, CHOLHDL, LDLDIRECT in the last 72 hours. Thyroid Function Tests: No results for input(s): TSH, T4TOTAL, FREET4, T3FREE, THYROIDAB in the last 72 hours. Anemia Panel: No results for input(s): VITAMINB12, FOLATE, FERRITIN, TIBC, IRON, RETICCTPCT in the last 72 hours. Sepsis Labs: No results for input(s): PROCALCITON, LATICACIDVEN in the last 168 hours.  Recent Results (from the past 240 hour(s))  Culture, blood (routine x 2)     Status: None   Collection Time: 08/28/19 12:00 PM   Specimen: BLOOD  Result Value Ref Range Status   Specimen Description BLOOD BLOOD LEFT HAND  Final   Special Requests   Final    AEROBIC BOTTLE ONLY Blood Culture results may not  be optimal due to an inadequate volume of blood received in culture bottles   Culture   Final    NO GROWTH 5 DAYS Performed at [facility], [place] [address], [place], [place] [address]    Report Status 09/02/2019 FINAL  Final  Culture, blood (routine x 2)     Status: None   Collection Time: 08/28/19 12:00 PM   Specimen: BLOOD  Result Value Ref Range Status  Specimen Description BLOOD LEFT ANTECUBITAL  Final   Special Requests   Final    AEROBIC BOTTLE ONLY Blood Culture results may not be optimal due to an inadequate volume of blood received in culture bottles   Culture   Final    NO GROWTH 5 DAYS Performed at [facility], [place] [address], [place], [place] [address]    Report Status 09/02/2019 FINAL  Final  Culture, blood (routine x 2)     Status: None (Preliminary result)   Collection Time: 09/01/19  4:26 PM   Specimen: BLOOD LEFT HAND  Result Value Ref Range Status   Specimen Description BLOOD LEFT HAND  Final   Special Requests   Final    BOTTLES DRAWN AEROBIC ONLY Blood Culture adequate volume   Culture   Final    NO GROWTH 3 DAYS Performed at [facility], [place] [address] [place], [place] [address]    Report Status PENDING  Incomplete  Culture, blood (routine x 2)     Status: None (Preliminary result)   Collection Time: 09/01/19  4:26 PM   Specimen: BLOOD LEFT HAND  Result Value Ref Range Status   Specimen Description BLOOD LEFT HAND  Final   Special Requests   Final    BOTTLES DRAWN AEROBIC ONLY Blood Culture adequate volume   Culture   Final    NO GROWTH 3 DAYS Performed at [facility], [place] [address], [place], [place] [address]    Report Status PENDING  Incomplete         Radiology Studies: No results found.      Scheduled Meds: .  stroke: mapping our early stages of recovery book   Does not apply Once  . amiodarone  200 mg Oral Daily  . ARIPiprazole  10 mg Oral BID  . atorvastatin  20 mg Per NG tube Daily  . chlorhexidine   15 mL Mouth Rinse BID  . Chlorhexidine Gluconate Cloth  6 each Topical Q0600  . Chlorhexidine Gluconate Cloth  6 each Topical Q0600  . cholecalciferol  1,000 Units Oral Daily  . darbepoetin (ARANESP) injection - DIALYSIS  100 mcg Intravenous Q Thu-HD  . DULoxetine  30 mg Oral Daily  . feeding supplement  1 Container Oral TID BM  . feeding supplement (NEPRO CARB STEADY)  237 mL Oral BID BM  . ferrous sulfate  300 mg Per Tube Q breakfast  . insulin aspart  0-9 Units Subcutaneous Q4H  . mouth rinse  15 mL Mouth Rinse BID  . midodrine  10 mg Per Tube TID WC  . multivitamin  1 tablet Oral QHS  . nystatin  5 mL Oral QID  . pantoprazole sodium  40 mg Per Tube Daily  . sodium chloride flush  10-40 mL Intracatheter Q12H  . thiamine injection  100 mg Intravenous Daily  . Thrombi-Pad  1 each Topical Once  . valproic acid  250 mg Per Tube Q6H  . Warfarin - Pharmacist Dosing Inpatient   Does not apply q1800   Continuous Infusions: . sodium chloride 10 mL (08/20/19 2025)  . sodium chloride    . sodium chloride    .  ceFAZolin (ANCEF) IV    . feeding supplement (OSMOLITE 1.5 CAL) 1,000 mL (09/03/19 2041)     LOS: 32 days    Time spent: 35 minutes.     Elmarie Shiley, MD [facility]  If 7PM-7AM, please contact night-coverage [email or web address] Password [facility] 09/04/2019, 3:01  PM  

## 2019-09-05 DIAGNOSIS — R7881 Bacteremia: Secondary | ICD-10-CM | POA: Diagnosis not present

## 2019-09-05 DIAGNOSIS — B9561 Methicillin susceptible Staphylococcus aureus infection as the cause of diseases classified elsewhere: Secondary | ICD-10-CM | POA: Diagnosis not present

## 2019-09-05 LAB — CBC
HCT: 24.8 % — ABNORMAL LOW (ref 36.0–46.0)
Hemoglobin: 7.5 g/dL — ABNORMAL LOW (ref 12.0–15.0)
MCH: 28.5 pg (ref 26.0–34.0)
MCHC: 30.2 g/dL (ref 30.0–36.0)
MCV: 94.3 fL (ref 80.0–100.0)
Platelets: 346 10*3/uL (ref 150–400)
RBC: 2.63 MIL/uL — ABNORMAL LOW (ref 3.87–5.11)
RDW: 16.6 % — ABNORMAL HIGH (ref 11.5–15.5)
WBC: 17.3 10*3/uL — ABNORMAL HIGH (ref 4.0–10.5)
nRBC: 0 % (ref 0.0–0.2)

## 2019-09-05 LAB — RENAL FUNCTION PANEL
Albumin: 1.8 g/dL — ABNORMAL LOW (ref 3.5–5.0)
Anion gap: 11 (ref 5–15)
BUN: 27 mg/dL — ABNORMAL HIGH (ref 8–23)
CO2: 30 mmol/L (ref 22–32)
Calcium: 9.6 mg/dL (ref 8.9–10.3)
Chloride: 98 mmol/L (ref 98–111)
Creatinine, Ser: 2.69 mg/dL — ABNORMAL HIGH (ref 0.44–1.00)
GFR calc Af Amer: 20 mL/min — ABNORMAL LOW (ref >60)
GFR calc non Af Amer: 17 mL/min — ABNORMAL LOW (ref >60)
Glucose, Bld: 106 mg/dL — ABNORMAL HIGH (ref 70–99)
Phosphorus: 4.4 mg/dL (ref 2.5–4.6)
Potassium: 4.9 mmol/L (ref 3.5–5.1)
Sodium: 139 mmol/L (ref 135–145)

## 2019-09-05 LAB — GLUCOSE, CAPILLARY
Glucose-Capillary: 104 mg/dL — ABNORMAL HIGH (ref 70–99)
Glucose-Capillary: 105 mg/dL — ABNORMAL HIGH (ref 70–99)
Glucose-Capillary: 106 mg/dL — ABNORMAL HIGH (ref 70–99)
Glucose-Capillary: 108 mg/dL — ABNORMAL HIGH (ref 70–99)
Glucose-Capillary: 112 mg/dL — ABNORMAL HIGH (ref 70–99)
Glucose-Capillary: 95 mg/dL (ref 70–99)

## 2019-09-05 LAB — PHOSPHORUS: Phosphorus: 3.7 mg/dL (ref 2.5–4.6)

## 2019-09-05 LAB — PROTIME-INR
INR: 1.8 — ABNORMAL HIGH (ref 0.8–1.2)
Prothrombin Time: 20.9 seconds — ABNORMAL HIGH (ref 11.4–15.2)

## 2019-09-05 MED ORDER — CHLORHEXIDINE GLUCONATE CLOTH 2 % EX PADS
6.0000 | MEDICATED_PAD | Freq: Every day | CUTANEOUS | Status: DC
  Administered 2019-09-06 – 2019-09-07 (×2): 6 via TOPICAL

## 2019-09-05 MED ORDER — WARFARIN SODIUM 2.5 MG PO TABS
2.5000 mg | ORAL_TABLET | Freq: Once | ORAL | Status: AC
  Administered 2019-09-05: 2.5 mg via ORAL
  Filled 2019-09-05: qty 1

## 2019-09-05 MED ORDER — PRO-STAT SUGAR FREE PO LIQD
30.0000 mL | Freq: Two times a day (BID) | ORAL | Status: DC
  Administered 2019-09-05 (×2): 30 mL
  Filled 2019-09-05 (×3): qty 30

## 2019-09-05 MED ORDER — CEFAZOLIN SODIUM-DEXTROSE 2-4 GM/100ML-% IV SOLN
2.0000 g | INTRAVENOUS | Status: DC
  Administered 2019-09-09 – 2019-09-16 (×4): 2 g via INTRAVENOUS
  Filled 2019-09-05 (×4): qty 100

## 2019-09-05 NOTE — Progress Notes (Signed)
  Speech Language Pathology Treatment: Dysphagia;Cognitive-Linquistic  Patient Details Name: Jaclyn Day MRN: [id number] DOB: 1946-09-06 Today's Date: 09/05/2019 Time: [id number] SLP Time Calculation (min) (ACUTE ONLY): 20 min  Assessment / Plan / Recommendation Clinical Impression  Pt was able to recall what she had for breakfast given yes/no questions from her boyfriend (SLP had educated him about giving her options to facilitate recall, as opposed to giving her the answers). She followed one-step commands well within functional tasks given Min cues. Pt made simple wants/needs known during PO trials and verbalized her preference for flavors. SLP provided Mod cues for increased labial seal around the edge of the cup during drinking and when boluses were in her mouth for improved oral containment. This reduced the amount of anterior loss she exhibited with thin liquids to only small or mild amounts. At times pt would let liquids (or liquid residue) sit in her oral cavity without swallowing, leading to an immediate cough x2. When she swallowed in a seemingly more timely manner, she had no overt s/s of aspiration. Recommend continuing current diet. SLP also provided education to patient and boyfriend about really focusing on labial seal and use of lingual sweep during PO intake. Will continue to follow.    HPI HPI: Pt is a 73 yo F admitted for witnessed out of hospital cardiac arrest, unclear etiology, with field ROSC, resultant respiratory failure and encephalopathy s/p TTM; ETT 10/22-11/1. CRRT initiated 10/30. PMH: unilateral congenital absence of kidney, Afib, HTN, CKD, CHF, anemia, mood disorder.  MRI on 11/7 revealed "Several scattered small acute infarcts in the white matter of the superior frontal lobes, left occipital lobe. No associated hemorrhage or mass effect. 2. Several small subacute or perhaps chronic infarcts in the bilateral cerebellum."      SLP Plan  Continue with current plan of  care;MBS       Recommendations  Diet recommendations: Dysphagia 1 (puree);Thin liquid Liquids provided via: Cup;Teaspoon Medication Administration: Crushed with puree Supervision: Staff to assist with self feeding;Full supervision/cueing for compensatory strategies Compensations: Minimize environmental distractions;Slow rate;Small sips/bites Postural Changes and/or Swallow Maneuvers: Seated upright 90 degrees                Oral Care Recommendations: Oral care BID Follow up Recommendations: LTACH SLP Visit Diagnosis: Dysphagia, oropharyngeal phase (R13.12) Plan: Continue with current plan of care;MBS       GO                Venita Sheffield Ritesh Opara 09/05/2019, 1:34 PM  [name], M.A. [place] Acute Environmental education officer [phone] Office [phone]

## 2019-09-05 NOTE — Progress Notes (Addendum)
Physical Therapy Re-Evaluation Patient Details Name: Jaclyn Day MRN: [id number] DOB: Sep 03, 1946 Today's Date: 09/05/2019    History of Present Illness 73yo female admitted after out of hospital cardiac arrest, unclear etiology CPR started by fire 10 min CPR with 1 Epi before ROSC. PMH includes a-fib, diastolic CHF, Mood disorder, CKD. CRRT initiated on 10/31 for volume removal. Pt transitioned to intermittent HD.     PT Comments    Pt in bed with spouse present at time of PT arrival, agreeable to PT session. Pt was able to demo improved initiation of movement to perform bed mobility (LUE > RUE), but continues to need maxA of 2 to complete transfer to sitting EOB. Pt was able to demo sig improved sitting balance when sitting EOB as she was able to maintain midline posture with supervision and UE support. Pt then attempted to assist with lateral scoot transfer, but demos poor use of BUE to complete scooting. Pt goals remain appropriate and  will continue to benefit from skilled PT to address limitations in balance and functional mobility.     Follow Up Recommendations  Supervision/Assistance - 24 hour;LTACH     Equipment Recommendations  None recommended by PT    Recommendations for Other Services       Precautions / Restrictions Precautions Precautions: Fall Precaution Comments: BUE weakness with digit extension limitations noted as well Restrictions Weight Bearing Restrictions: No    Mobility  Bed Mobility Overal bed mobility: Needs Assistance Bed Mobility: Supine to Sit Rolling: +2 for physical assistance;Max assist         General bed mobility comments: multimodal cues for sequencing; pt attempted to initiate movements but needs maxA of 2 to complete, with bed mobility and grimacing with movement of extremities particularly on R side  Transfers Overall transfer level: Needs assistance   Transfers: Lateral/Scoot Transfers          Lateral/Scoot Transfers:  Total assist;+2 physical assistance;+2 safety/equipment General transfer comment: total A +2 scoot from bed to drop arm recliner, recommend slide board for future trasnfers  Ambulation/Gait             General Gait Details: unable   Stairs             Wheelchair Mobility    Modified Rankin (Stroke Patients Only)       Balance Overall balance assessment: Needs assistance Sitting-balance support: Feet supported;Single extremity supported Sitting balance-Leahy Scale: Fair Sitting balance - Comments: initially post and L lean, able to use BUE support to maintain without assist Postural control: Posterior lean;Left lateral lean     Standing balance comment: unable                            Cognition Arousal/Alertness: Awake/alert Behavior During Therapy: Flat affect Overall Cognitive Status: Impaired/Different from baseline Area of Impairment: Attention;Following commands;Problem solving                 Orientation Level: Place;Time;Situation Current Attention Level: Focused   Following Commands: Follows one step commands with increased time Safety/Judgement: Decreased awareness of deficits   Problem Solving: Slow processing;Decreased initiation;Difficulty sequencing;Requires verbal cues;Requires tactile cues General Comments: needs direct simple cues to attend to task      Exercises      General Comments        Pertinent Vitals/Pain Pain Assessment: Faces Faces Pain Scale: Hurts a little bit Pain Location: painful RUE/BLE range Pain Descriptors / Indicators: Grimacing;Guarding  Pain Intervention(s): Limited activity within patient's tolerance;Monitored during session;Repositioned    Home Living                      Prior Function            PT Goals (current goals can now be found in the care plan section) Acute Rehab PT Goals Patient Stated Goal: go home PT Goal Formulation: With patient Time For Goal Achievement:  09/19/19 Potential to Achieve Goals: Fair Progress towards PT goals: Progressing toward goals    Frequency    Min 2X/week      PT Plan Current plan remains appropriate    Co-evaluation              AM-PAC PT "6 Clicks" Mobility   Outcome Measure  Help needed turning from your back to your side while in a flat bed without using bedrails?: A Lot Help needed moving from lying on your back to sitting on the side of a flat bed without using bedrails?: A Lot Help needed moving to and from a bed to a chair (including a wheelchair)?: Total Help needed standing up from a chair using your arms (e.g., wheelchair or bedside chair)?: Total Help needed to walk in hospital room?: Total Help needed climbing 3-5 steps with a railing? : Total 6 Click Score: 8    End of Session Equipment Utilized During Treatment: Oxygen;Gait belt Activity Tolerance: Patient tolerated treatment well Patient left: with call bell/phone within reach;in chair;with chair alarm set;with family/visitor present Nurse Communication: Mobility status;Need for lift equipment PT Visit Diagnosis: Muscle weakness (generalized) (M62.81)     Time: [id number] PT Time Calculation (min) (ACUTE ONLY): 38 min  Charges:  $Therapeutic Activity: 38-52 mins                     Mickey Farber, PT, DPT   Acute Rehabilitation Department [phone]   Otho Bellows 09/05/2019, 2:32 PM

## 2019-09-05 NOTE — Progress Notes (Signed)
[facility] Progress Note    Assessment/ Plan:   #dialysis dep't AoCKD4: CKD due to hypertension.  AKI likely ATN in the setting of shock, cardiac arrest and bacteremia.  She was on CRRT 10/30-11/1.   -Kidney ultrasound showed solitary right kidney with cortical thinning and cysts. -Seen by palliative, full scope of care, potentially to LTAC?  - s/p TDC with IR 11/11 - Normally iHD per T Th Sat Schedule:  - s/p HD on 11/23 for holiday schedule. Plan for next HD on 11/25   - cont to eval for possible AVF or AVG; too debilitated currently.  Note also on warfarin    - given renal failure would transition off of cymbalta per primary team   #Hyperkalemia: on HD.  Labs ordered  #Acute encephalopathy: somewhat improved but still present.  11/7 MRI with scattered small acute infarcts and chronic infarcts noted.  #Cardiac arrest, acute on chronic systolic CHF: EF [id number] by echo, cardiology is following.  EF improved in TEE.  #MSSA bacteremia: abx per primary team.  End date six weeks from last negative culture 08/07/19 - 09/18/19.  #Hypotension: Stable.  Continue midodrine.  #Acute respiratory failure with hypoxia: HD with UF. Continue supportive care.   #Paroxysmal atrial fibrillation: Cardiology following.  On amiodarone. Anticoagulation per primary   #Anemia - multifactorial with renal failure: hgb 5.9 11/15 in setting of TDC ooze- IR has seen and stitched, Got 1 u PRBCs, on Aranesp 100 q Thursday   #Dispo: ? LTAC  Subjective:    Had HD yesterday - states went ok.  Team looking at [facility] facilities  Review of systems Denies shortness of breath or chest pain   Denies nausea or vomiting     Objective:   BP 102/73 (BP Location: Right Arm)   Pulse 91   Temp 98.2 F (36.8 C) (Oral)   Resp 20   Ht 5\' 6"  (1.676 m)   Wt 72.7 kg   SpO2 100%   BMI 25.87 kg/m   Intake/Output Summary (Last 24 hours) at 09/05/2019 1211 Last data filed at 09/05/2019 0900 Gross  per 24 hour  Intake 906.52 ml  Output 0 ml  Net 906.52 ml   Weight change: -0.6 kg  Physical Exam: General: NAD, awake  HEENT: NCAT sclera anicteric Heart: s1s2 nl, no rubs Lungs: clear anteriorly unlabored  Abdomen:soft, Non-tender, non-distended Extremities: no pitting edema lower extremities  Dialysis Access: RIJ TDC c/d/i  Imaging: No results found.  Labs: BMET Recent Labs  Lab 08/30/19 0832  08/31/19 1300 09/01/19 0354 09/02/19 0254 09/02/19 1304 09/03/19 0325 09/04/19 0608 09/04/19 1200 09/05/19 0357  NA 139  --  135 136  --  136  --   --  136  --   K 4.7  --  5.2* 4.1  --  5.4*  --   --  5.6*  --   CL 99  --  93* 96*  --  97*  --   --  98  --   CO2 28  --  27 27  --  27  --   --  29  --   GLUCOSE 135*  --  138* 133*  --  137*  --   --  96  --   BUN 27*  --  44* 23  --  48*  --   --  47*  --   CREATININE 2.85*  --  4.08* 2.42*  --  3.75*  --   --  3.81*  --  CALCIUM 9.0  --  9.1 8.9  --  9.3  --   --  9.2  --   PHOS  --    < > 3.9 3.3 4.0 4.0 3.0 4.6 4.6 3.7   < > = values in this interval not displayed.   CBC Recent Labs  Lab 09/02/19 0254 09/03/19 0325 09/04/19 0608 09/05/19 0357  WBC 19.8* 19.8* 16.0* 17.3*  HGB 8.0* 7.8* 7.9* 7.5*  HCT 25.9* 25.7* 25.9* 24.8*  MCV 91.5 92.8 92.8 94.3  PLT 334 357 331 346    Medications:    .  stroke: mapping our early stages of recovery book   Does not apply Once  . amiodarone  200 mg Oral Daily  . ARIPiprazole  10 mg Oral BID  . atorvastatin  20 mg Per NG tube Daily  . chlorhexidine  15 mL Mouth Rinse BID  . Chlorhexidine Gluconate Cloth  6 each Topical Q0600  . Chlorhexidine Gluconate Cloth  6 each Topical Q0600  . cholecalciferol  1,000 Units Oral Daily  . darbepoetin (ARANESP) injection - DIALYSIS  100 mcg Intravenous Q Thu-HD  . DULoxetine  30 mg Oral Daily  . feeding supplement  1 Container Oral TID BM  . feeding supplement (NEPRO CARB STEADY)  237 mL Oral BID BM  . ferrous sulfate  300 mg Per  Tube Q breakfast  . insulin aspart  0-9 Units Subcutaneous Q4H  . mouth rinse  15 mL Mouth Rinse BID  . midodrine  10 mg Per Tube TID WC  . multivitamin  1 tablet Oral QHS  . nystatin  5 mL Oral QID  . pantoprazole sodium  40 mg Per Tube Daily  . sodium chloride flush  10-40 mL Intracatheter Q12H  . thiamine injection  100 mg Intravenous Daily  . Thrombi-Pad  1 each Topical Once  . valproic acid  250 mg Per Tube Q6H  . warfarin  2.5 mg Oral ONCE-1800  . Warfarin - Pharmacist Dosing Inpatient   Does not apply q1800    Claudia Desanctis 09/05/2019, 12:11 PM

## 2019-09-05 NOTE — Progress Notes (Signed)
PROGRESS NOTE    Jaclyn Day  [id number] DOB: 01-08-1946 DOA: 08/03/2019 PCP: Cyndi Bender, PA-C   Brief Narrative: 73 year old past medical history significant for congenital unilateral kidney, stage IV chronic kidney disease with baseline creatinine about 2.2--2.8, hypertension, persistent A. fib was on Eliquis, chronic diastolic heart failure, history of cardioversion for A. fib admitted to ICU after witnessed cardiac arrest.  On 10/22 patient was admitted after cardiac arrest, intubated, initiation of targeted temperature management 36 Celsius.  Echo showed left ventricular ejection fraction 20 to 25%, global hypokinesis, RV mildly reduced function with normal size. On 10/25: Blood cultures came back positive for staph.  She was a started on vancomycin. Patient was a started on amiodarone drip, she is off of Levophed and neo-.  She was a started on heparin drip, which was subsequently discontinued.  She was also started on hemodialysis.   Patient has been transitioned from amiodarone drip to oral amiodarone.  She was started on Coumadin. 10/26: Patient was started on Precedex for agitation, femoral CVL was discontinued. Patient respiratory status has been remaining stable, for A. fib she is now on oral amiodarone and Coumadin.  Heart rate has been well controlled.  She is getting IV antibiotics for presumed endocarditis and MSSA bacteremia.  Last dose of IV antibiotics was planned for 12/7.   Patient continues to have core track for tube feedings.  She was evaluated by nutritionist, who is recommending to continue with with tube feedings if possible depending on disposition, because patient is not able to meet needs for oral intake during hemodialysis days.  Patient is severely deconditioned, she will need to continue to work with PT to be able to tolerate outpatient hemodialysis. LTAC was declined.  Assessment & Plan:   Principal Problem:   MSSA bacteremia Active Problems:    Cardiac arrest ([place])   Pressure injury of skin   Acute respiratory failure with hypoxia (HCC)   AKI (acute kidney injury) (HCC)   Elevated troponin   Persistent atrial fibrillation (HCC)   Acute on chronic combined systolic and diastolic CHF (congestive heart failure) (HCC)   Acute bacterial endocarditis   Malnutrition of moderate degree   Advanced care planning/counseling discussion   Goals of care, counseling/discussion   Palliative care by specialist   Closed fracture of multiple ribs   Cerebral embolism with cerebral infarction  1-Acute hypoxic respiratory failure due to cardiac arrest and pulmonary edema: Bilateral pleural effusion: Patient presented after outside hospital cardiac arrest Patient was intubated and is status post extubation, on nasal cannula oxygen Nephrology managing volume status via hemodialysis Patient was more  tachypnic 11/19, chest x ray stable from before. She gets anxious and hyperventilate.  Respiratory status stable.   2-Acute metabolic encephalopathy, suspect multifactorial with recent multiple acute CVA -Setting of cardiac arrest, hypothermia, MSSA bacteremia, prolonged hospitalization. CT head 11/11 stable Mental status is slowly improving. Sleepy at times.  On valproic acid continue with as long standing medication EEG negative for seizure Palliative care was consulted. MRI on 08/19/2019 showed several scattered small acute infarct in the white matter of the superior frontal lobe, left occipital lobe.  Several small subacute chronic infarct in bilateral cerebellum. Neurology help with patient care. TTE no vegetation or thrombus. Patient continued to be alert, severely deconditioned.  3-Bleeding from Hemodialysis catheter side related: 11/15 IR saw the patient, status post 10 cc 1% lidocaine with epinephrine was injected into subcutaneous tract Stable  4-Multiple acute subacute CVA involving white matter: Patient was on  heparin drip.   Neurology was okay with anticoagulation. On coumadin. Pharmacy managing.   5-Witnessed cardiac arrest in the setting of chronic A. Fib: Patient treated with hypothermia protocol Left heart cath initially deferred due to acute on chronic renal failure Ejection fraction initially down to 20 to 25% on 10/20-second/2020, improved to 55 to 60% on echo 11/90/2020. Follow cardiology recommendation in regard ischemic evaluation  6-A. fib with RVR: Cardiology was following. On coumadin, no bridge due to concern for bleeding.  Amiodarone change to oral.  No plan for inpatient cardioversion per cardiology.   7-Acute on chronic systolic heart failure: TTE on 10/22 with EF 20%.  Repeat TEE with improved ejection fraction to 60%.  Volume management with hemodialysis.  8-MSSA bacteremia; and endocarditis, Vegetation AV  Infectious vs fibrous tissue.  Repeat blood cultures negative. ID recommended cefazolin until 12/7. Treating for 6 weeks.    9-Acute blood loss anemia: No sign of overt bleeding, she had bled from hemodialysis catheter on 11/15. Status post transfuse 1 unit of packed red blood cell. Hs has remain 8 range.   10-AKI on chronic kidney disease a stage IV: Suspect AKI likely ATN in the setting of shock, cardiac arrest bacteremia. She was initially started on CRRT 10/30---11/1 Currently getting hemodialysis. Too weak to undergoing AVF or  AV graft  11-Hyperkalemia: Resolved  12-Nutrition: On tube feeding via core track tube He was a started on dysphagia 1 diet. He was transitioned to nocturnal feeding Osmolite. Monitor oral intake to determine removal of tube feeding Nutrition to follow on calory count.   13-oral thrush; started nystatin. 14-Leukocytosis;  On IV antibiotics. Chest x ray stable. Follow trend. .  Blood culture 11-20: no growth to date.  WBC fluctuates.  Vitamin D deficiency; replete for 7 day repeat level.   Nutrition Problem: Moderate Malnutrition  Etiology: acute illness(acute respiratory failure, AKI required CRRT, endocarditis)    Signs/Symptoms: mild fat depletion, mild muscle depletion, moderate muscle depletion    Interventions: Tube feeding  Estimated body mass index is 25.87 kg/m as calculated from the following:   Height as of this encounter: 5\' 6"  (1.676 m).   Weight as of this encounter: 72.7 kg.   DVT prophylaxis: Heparin Code Status: Full code Family Communication: [name] at bedside.  Disposition Plan: Remain in the hospital for  antibiotics, hemodialysis, very deconditioning. She will benefit form LTAC Consultants:  Nephrology IR Neurology Palliative care Cardiology  Procedures:  placement of tunneled HD catheter, CRRT, HD  Antimicrobials:  Rocephin 10/22 >> 10/25 Vanc (staph in blood ) 10/24  Cefazolin 10/25 >>  Subjective: She is alert, denies dyspnea.  She requires to be fed.  She is very weak  Objective: Vitals:   09/05/19 0445 09/05/19 0737 09/05/19 0800 09/05/19 1145  BP:  102/68 (!) 102/58 102/73  Pulse:  78 90 91  Resp:  19  20  Temp:   98.2 F (36.8 C)   TempSrc:   Oral   SpO2:  100%  100%  Weight: 72.7 kg     Height:        Intake/Output Summary (Last 24 hours) at 09/05/2019 1357 Last data filed at 09/05/2019 0900 Gross per 24 hour  Intake 906.52 ml  Output 0 ml  Net 906.52 ml   Filed Weights   09/03/19 0351 09/04/19 0420 09/05/19 0445  Weight: 75 kg 73.3 kg 72.7 kg    Examination:  General exam: No acute distress, chronically ill. Respiratory system: Decreased breath sounds, bilateral crackles Cardiovascular  system: S1, S2 regular rhythm and rate Gastrointestinal system: Bowel sounds present, soft nontender not distended Central nervous system: Alert, answer questions, very deconditioned Extremities: Edema all 4 extremities Skin: No rash     Data Reviewed: I have personally reviewed following labs and imaging studies  CBC: Recent Labs  Lab 09/01/19 0354  09/02/19 0254 09/03/19 0325 09/04/19 0608 09/05/19 0357  WBC 19.1* 19.8* 19.8* 16.0* 17.3*  HGB 7.3* 8.0* 7.8* 7.9* 7.5*  HCT 23.8* 25.9* 25.7* 25.9* 24.8*  MCV 92.2 91.5 92.8 92.8 94.3  PLT 301 334 357 331 [id number]   Basic Metabolic Panel: Recent Labs  Lab 08/31/19 1300 09/01/19 0354  09/02/19 1304 09/03/19 0325 09/04/19 0608 09/04/19 1200 09/05/19 0357 09/05/19 1223  NA 135 136  --  136  --   --  136  --  139  K 5.2* 4.1  --  5.4*  --   --  5.6*  --  4.9  CL 93* 96*  --  97*  --   --  98  --  98  CO2 27 27  --  27  --   --  29  --  30  GLUCOSE 138* 133*  --  137*  --   --  96  --  106*  BUN 44* 23  --  48*  --   --  47*  --  27*  CREATININE 4.08* 2.42*  --  3.75*  --   --  3.81*  --  2.69*  CALCIUM 9.1 8.9  --  9.3  --   --  9.2  --  9.6  PHOS 3.9 3.3   < > 4.0 3.0 4.6 4.6 3.7 4.4   < > = values in this interval not displayed.   GFR: Estimated Creatinine Clearance: 19.3 mL/min (A) (by C-G formula based on SCr of 2.69 mg/dL (H)). Liver Function Tests: Recent Labs  Lab 08/31/19 1300 09/01/19 0354 09/02/19 1304 09/04/19 1200 09/05/19 1223  ALBUMIN 1.4* 1.4* 1.6* 1.6* 1.8*   No results for input(s): LIPASE, AMYLASE in the last 168 hours. Recent Labs  Lab 08/31/19 0403  AMMONIA 22   Coagulation Profile: Recent Labs  Lab 09/01/19 0354 09/02/19 0254 09/03/19 0325 09/04/19 0608 09/05/19 0357  INR 1.8* 3.4* 2.3* 2.0* 1.8*   Cardiac Enzymes: No results for input(s): CKTOTAL, CKMB, CKMBINDEX, TROPONINI in the last 168 hours. BNP (last 3 results) No results for input(s): PROBNP in the last 8760 hours. HbA1C: No results for input(s): HGBA1C in the last 72 hours. CBG: Recent Labs  Lab 09/04/19 1933 09/04/19 2340 09/05/19 0405 09/05/19 0736 09/05/19 1144  GLUCAP 129* 108* 105* 104* 112*   Lipid Profile: No results for input(s): CHOL, HDL, LDLCALC, TRIG, CHOLHDL, LDLDIRECT in the last 72 hours. Thyroid Function Tests: No results for input(s): TSH, T4TOTAL,  FREET4, T3FREE, THYROIDAB in the last 72 hours. Anemia Panel: No results for input(s): VITAMINB12, FOLATE, FERRITIN, TIBC, IRON, RETICCTPCT in the last 72 hours. Sepsis Labs: No results for input(s): PROCALCITON, LATICACIDVEN in the last 168 hours.  Recent Results (from the past 240 hour(s))  Culture, blood (routine x 2)     Status: None   Collection Time: 08/28/19 12:00 PM   Specimen: BLOOD  Result Value Ref Range Status   Specimen Description BLOOD BLOOD LEFT HAND  Final   Special Requests   Final    AEROBIC BOTTLE ONLY Blood Culture results may not be optimal due to an inadequate volume of blood received in culture  bottles   Culture   Final    NO GROWTH 5 DAYS Performed at [facility], [place] [address] [place], [place] [address]    Report Status 09/02/2019 FINAL  Final  Culture, blood (routine x 2)     Status: None   Collection Time: 08/28/19 12:00 PM   Specimen: BLOOD  Result Value Ref Range Status   Specimen Description BLOOD LEFT ANTECUBITAL  Final   Special Requests   Final    AEROBIC BOTTLE ONLY Blood Culture results may not be optimal due to an inadequate volume of blood received in culture bottles   Culture   Final    NO GROWTH 5 DAYS Performed at [facility], [place] [address], [place], [place] [address]    Report Status 09/02/2019 FINAL  Final  Culture, blood (routine x 2)     Status: None (Preliminary result)   Collection Time: 09/01/19  4:26 PM   Specimen: BLOOD LEFT HAND  Result Value Ref Range Status   Specimen Description BLOOD LEFT HAND  Final   Special Requests   Final    BOTTLES DRAWN AEROBIC ONLY Blood Culture adequate volume   Culture   Final    NO GROWTH 3 DAYS Performed at [facility], [place] [address] [place], [place] [address]    Report Status PENDING  Incomplete  Culture, blood (routine x 2)     Status: None (Preliminary result)   Collection Time: 09/01/19  4:26 PM   Specimen: BLOOD LEFT HAND  Result Value Ref Range Status    Specimen Description BLOOD LEFT HAND  Final   Special Requests   Final    BOTTLES DRAWN AEROBIC ONLY Blood Culture adequate volume   Culture   Final    NO GROWTH 3 DAYS Performed at [facility], [place] [address], [place], [place] [address]    Report Status PENDING  Incomplete         Radiology Studies: No results found.      Scheduled Meds: .  stroke: mapping our early stages of recovery book   Does not apply Once  . amiodarone  200 mg Oral Daily  . ARIPiprazole  10 mg Oral BID  . atorvastatin  20 mg Per NG tube Daily  . chlorhexidine  15 mL Mouth Rinse BID  . Chlorhexidine Gluconate Cloth  6 each Topical Q0600  . Chlorhexidine Gluconate Cloth  6 each Topical Q0600  . cholecalciferol  1,000 Units Oral Daily  . darbepoetin (ARANESP) injection - DIALYSIS  100 mcg Intravenous Q Thu-HD  . DULoxetine  30 mg Oral Daily  . feeding supplement  1 Container Oral TID BM  . feeding supplement (NEPRO CARB STEADY)  237 mL Oral BID BM  . feeding supplement (PRO-STAT SUGAR FREE 64)  30 mL Per Tube BID  . ferrous sulfate  300 mg Per Tube Q breakfast  . insulin aspart  0-9 Units Subcutaneous Q4H  . mouth rinse  15 mL Mouth Rinse BID  . midodrine  10 mg Per Tube TID WC  . multivitamin  1 tablet Oral QHS  . nystatin  5 mL Oral QID  . pantoprazole sodium  40 mg Per Tube Daily  . sodium chloride flush  10-40 mL Intracatheter Q12H  . thiamine injection  100 mg Intravenous Daily  . Thrombi-Pad  1 each Topical Once  . valproic acid  250 mg Per Tube Q6H  . warfarin  2.5 mg Oral ONCE-1800  .  Warfarin - Pharmacist Dosing Inpatient   Does not apply q1800   Continuous Infusions: . sodium chloride 10 mL (08/20/19 2025)  . sodium chloride    . sodium chloride    .  ceFAZolin (ANCEF) IV Stopped (09/04/19 2157)  . [START ON 09/09/2019]  ceFAZolin (ANCEF) IV    . feeding supplement (OSMOLITE 1.5 CAL) 1,000 mL (09/04/19 2124)     LOS: 33 days    Time spent: 35 minutes.     Elmarie Shiley, MD [facility]  If 7PM-7AM, please contact night-coverage [email or web address] Password [email or web address] 09/05/2019, 1:57 PM

## 2019-09-05 NOTE — Progress Notes (Signed)
ANTICOAGULATION CONSULT NOTE - Follow-Up Consult  Pharmacy Consult for Warfarin Indication: atrial fibrillation  Allergies  Allergen Reactions  . Codeine Other (See Comments)    Reaction not recalled by family- was told to never take this    Patient Measurements: Height: 5\' 6"  (167.6 cm) Weight: 160 lb 4.4 oz (72.7 kg) IBW/kg (Calculated) : 59.3  Vital Signs: Temp: 98 F (36.7 C) (11/24 0417) Temp Source: Oral (11/24 0417) BP: 122/84 (11/24 0417) Pulse Rate: 88 (11/24 0442)  Labs: Recent Labs    09/02/19 1304  09/03/19 0325 09/04/19 0608 09/04/19 1200 09/05/19 0357  HGB  --    < > 7.8* 7.9*  --  7.5*  HCT  --   --  25.7* 25.9*  --  24.8*  PLT  --   --  357 331  --  346  LABPROT  --   --  25.1* 22.8*  --  20.9*  INR  --   --  2.3* 2.0*  --  1.8*  CREATININE 3.75*  --   --   --  3.81*  --    < > = values in this interval not displayed.    Estimated Creatinine Clearance: 13.6 mL/min (A) (by C-G formula based on SCr of 3.81 mg/dL (H)).   Medical History: Past Medical History:  Diagnosis Date  . Anemia   . Chronic diastolic CHF (congestive heart failure) ([place])   . CKD (chronic kidney disease), stage IV ([place])   . Hypertension   . Persistent atrial fibrillation ([place])    a. dx 01/2019 s/p TEE DCCV.  Marland Kitchen Renal disorder   . Unilateral congenital absence of kidney    Assessment: Pt is a 73 yr old female admitted s/p out of hospital cardiac arrest and TTM. Admit c/b by encephalopathy, AKI which progressed to HD, and ?embolic CVA. Pt on apixaban PTA for Afib, which was held for IV heparin. Heparin subsequently held on 11/16 due to bleeding from HD lines and low Hgb. Warfarin started 11/18 with no bridge planned. Patient at amiodarone PO with no plans for DCCV.  INR slightly subtherapeutic at 1.8, H/H stable but low.   Goal of Therapy:  INR 2-3 Monitor platelets by anticoagulation protocol: Yes   Plan:  Warfarin 2.5 mg PO x1 again tonight Daily INR   Arrie Senate, PharmD, BCPS Clinical Pharmacist [phone] Please check AMION for all [place] numbers 09/05/2019

## 2019-09-05 NOTE — TOC Progression Note (Signed)
Transition of Care [facility]) - Progression Note    Patient Details  Name: JAKAYA SCHEETZ MRN: [id number] Date of Birth: Dec 05, 1945  [facility]) CM/SW Contact  Graves-Bigelow, Ocie Cornfield, RN Phone Number: 09/05/2019, 3:19 PM  Clinical Narrative:   Insurance denied LTAC-@ [facility]. Expedited appeal was denied as well. The next encounter for the appeals process would be via [employer]- at [phone]. [facility] Admissions Coordinator to discuss with patient regarding insurance. CM will discuss plan for SNF and outpatient HD after there discussion. CM will continue to follow for transition of care needs.   Expected Discharge Plan: Long Term Acute Care (LTAC) Barriers to Discharge: Continued Medical Work up  Expected Discharge Plan and Services Expected Discharge Plan: [place] (LTAC) In-house Referral: NA Discharge Planning Services: CM Consult Post Acute Care Choice: [place] Living arrangements for the past 2 months: Single Family Home                    Social Determinants of Health (SDOH) Interventions    Readmission Risk Interventions Readmission Risk Prevention Plan 08/08/2019  Transportation Screening Complete  PCP or Specialist Appt within 3-5 Days Complete  HRI or Home Care Consult Complete  Medication Review (RN Care Manager) Complete  Some recent data might be hidden

## 2019-09-05 NOTE — Progress Notes (Signed)
Nutrition Follow-up  DOCUMENTATION CODES:   Non-severe (moderate) malnutrition in context of acute illness/injury  INTERVENTION:   -Boost Breeze po TID, each supplement provides 250 kcal and 9 grams of protein -Nepro Shake po BID, each supplement provides 425 kcal and 19 grams protein -Rena-vit daily  Nocturnal feedings: -Osmolite 1.5 to 33ml/hr via cortrak over 12 hour period(2000-0800) -Will add 30 ml Prostat BID Tube feeding regimen provides740kcal (41% of needs),52grams of protein, and 268ml of H2O.  NUTRITION DIAGNOSIS:   Moderate Malnutrition related to acute illness(acute respiratory failure, AKI required CRRT, endocarditis) as evidenced by mild fat depletion, mild muscle depletion, moderate muscle depletion.  Ongoing.  GOAL:   Patient will meet greater than or equal to 90% of their needs  Progressing.  MONITOR:   TF tolerance, Diet advancement, Labs, Weight trends, Skin  ASSESSMENT:   73 year old female who presented to the ED on 10/22 after cardiac arrest. PMH anemia, CHF, CKD stage IV, HTN, IDDM. Pt required intubation in the ED. TTM initiated.  10/22 -Admit, Intubated, normothermia TTM 10/24 - rewarmed, TF initiated 10/25 - HD catheter placed, first HD 10/27 - HD 10/28 - s/p TEE suspicious for vegetation 10/30 - CRRT initiated 11/01 - CRRT discontinued, extubated 11/03 -Cortrak tube placed, TF initiated 11/11- s/p HD cath placement 11/13- s/p MBSS- advanced to dysphagia 1 diet with thin liquids  **RD working remotely**  Intakes for 11/23: Staff assisted with meals B: 200 kcals, 9g protein L: none, pt was at HD D: 410 kcals, 16g protein Supplements: 300 kcals, 11g protein (1.25 Boost Breeze supplements) Total: 910 kcals, 36g protein  Patient is unable to meet needs by PO during HD days. Patient not exhibiting consistent intakes. Patient has to be compliant with all supplements to meet estimated needs. Would continue night TFs if possible  depending on disposition.  Patient consumed 25% of breakfast this morning, again with staff feeding her. This provided ~210 kcals, 6g protein.   Admission weight: 192 lbs. Current weight: 160 lbs. I/Os: -887 ml since 11/10  Next HD is 11/25.  Medications: Vitamin D tablet, Ferrous sulfate tablet, Rena-Vit tablet, IV Thiamine  Labs reviewed:  CBGs: 104-112 Elevated K Phos WNL  Diet Order:   Diet Order            DIET - DYS 1 Room service appropriate? Yes; Fluid consistency: Thin  Diet effective now              EDUCATION NEEDS:   Not appropriate for education at this time  Skin:  Skin Assessment: Reviewed RN Assessment Skin Integrity Issues:: DTI DTI: -  Last BM:  11/24 -type 6  Height:   Ht Readings from Last 1 Encounters:  08/03/19 5\' 6"  (1.676 m)    Weight:   Wt Readings from Last 1 Encounters:  09/05/19 72.7 kg    Ideal Body Weight:  59.1 kg  BMI:  Body mass index is 25.87 kg/m.  Estimated Nutritional Needs:   Kcal:  [id number]  Protein:  89-105  Fluid:  >/= 1.5 L  Clayton Bibles, MS, RD, LDN Inpatient Clinical Dietitian Pager: [phone] After Hours Pager: [phone]

## 2019-09-05 NOTE — Plan of Care (Signed)
  Problem: Clinical Measurements: Goal: Will remain free from infection Outcome: Progressing Goal: Respiratory complications will improve Outcome: Progressing   Problem: Nutrition: Goal: Adequate nutrition will be maintained Outcome: Progressing   Problem: Coping: Goal: Level of anxiety will decrease Outcome: Progressing   Problem: Pain Managment: Goal: General experience of comfort will improve Outcome: Progressing

## 2019-09-06 DIAGNOSIS — I330 Acute and subacute infective endocarditis: Secondary | ICD-10-CM | POA: Diagnosis not present

## 2019-09-06 DIAGNOSIS — I6319 Cerebral infarction due to embolism of other precerebral artery: Secondary | ICD-10-CM

## 2019-09-06 DIAGNOSIS — D72825 Bandemia: Secondary | ICD-10-CM

## 2019-09-06 DIAGNOSIS — R7881 Bacteremia: Secondary | ICD-10-CM | POA: Diagnosis not present

## 2019-09-06 DIAGNOSIS — I5043 Acute on chronic combined systolic (congestive) and diastolic (congestive) heart failure: Secondary | ICD-10-CM | POA: Diagnosis not present

## 2019-09-06 DIAGNOSIS — I959 Hypotension, unspecified: Secondary | ICD-10-CM

## 2019-09-06 DIAGNOSIS — D62 Acute posthemorrhagic anemia: Secondary | ICD-10-CM

## 2019-09-06 DIAGNOSIS — I469 Cardiac arrest, cause unspecified: Secondary | ICD-10-CM | POA: Diagnosis not present

## 2019-09-06 DIAGNOSIS — R1310 Dysphagia, unspecified: Secondary | ICD-10-CM

## 2019-09-06 LAB — PROTIME-INR
INR: 1.8 — ABNORMAL HIGH (ref 0.8–1.2)
Prothrombin Time: 21 seconds — ABNORMAL HIGH (ref 11.4–15.2)

## 2019-09-06 LAB — CBC
HCT: 26.1 % — ABNORMAL LOW (ref 36.0–46.0)
Hemoglobin: 7.8 g/dL — ABNORMAL LOW (ref 12.0–15.0)
MCH: 28.5 pg (ref 26.0–34.0)
MCHC: 29.9 g/dL — ABNORMAL LOW (ref 30.0–36.0)
MCV: 95.3 fL (ref 80.0–100.0)
Platelets: 363 10*3/uL (ref 150–400)
RBC: 2.74 MIL/uL — ABNORMAL LOW (ref 3.87–5.11)
RDW: 16.7 % — ABNORMAL HIGH (ref 11.5–15.5)
WBC: 18.1 10*3/uL — ABNORMAL HIGH (ref 4.0–10.5)
nRBC: 0 % (ref 0.0–0.2)

## 2019-09-06 LAB — HEPATITIS B SURFACE ANTIGEN: Hepatitis B Surface Ag: NONREACTIVE

## 2019-09-06 LAB — GLUCOSE, CAPILLARY
Glucose-Capillary: 92 mg/dL (ref 70–99)
Glucose-Capillary: 110 mg/dL — ABNORMAL HIGH (ref 70–99)
Glucose-Capillary: 144 mg/dL — ABNORMAL HIGH (ref 70–99)
Glucose-Capillary: 91 mg/dL (ref 70–99)
Glucose-Capillary: 104 mg/dL — ABNORMAL HIGH (ref 70–99)

## 2019-09-06 LAB — CULTURE, BLOOD (ROUTINE X 2)
Culture: NO GROWTH
Culture: NO GROWTH
Special Requests: ADEQUATE
Special Requests: ADEQUATE

## 2019-09-06 LAB — RENAL FUNCTION PANEL
Albumin: 1.7 g/dL — ABNORMAL LOW (ref 3.5–5.0)
Anion gap: 12 (ref 5–15)
BUN: 41 mg/dL — ABNORMAL HIGH (ref 8–23)
CO2: 28 mmol/L (ref 22–32)
Calcium: 9.5 mg/dL (ref 8.9–10.3)
Chloride: 97 mmol/L — ABNORMAL LOW (ref 98–111)
Creatinine, Ser: 3.33 mg/dL — ABNORMAL HIGH (ref 0.44–1.00)
GFR calc Af Amer: 15 mL/min — ABNORMAL LOW (ref >60)
GFR calc non Af Amer: 13 mL/min — ABNORMAL LOW (ref >60)
Glucose, Bld: 109 mg/dL — ABNORMAL HIGH (ref 70–99)
Phosphorus: 5.3 mg/dL — ABNORMAL HIGH (ref 2.5–4.6)
Potassium: 5.1 mmol/L (ref 3.5–5.1)
Sodium: 137 mmol/L (ref 135–145)

## 2019-09-06 MED ORDER — WARFARIN SODIUM 2.5 MG PO TABS
2.5000 mg | ORAL_TABLET | Freq: Once | ORAL | Status: AC
  Administered 2019-09-06: 2.5 mg via ORAL
  Filled 2019-09-06: qty 1

## 2019-09-06 MED ORDER — FERROUS SULFATE 325 (65 FE) MG PO TABS
325.0000 mg | ORAL_TABLET | Freq: Every day | ORAL | Status: DC
  Administered 2019-09-06 – 2019-09-24 (×19): 325 mg via ORAL
  Filled 2019-09-06 (×19): qty 1

## 2019-09-06 MED ORDER — PANTOPRAZOLE SODIUM 40 MG PO PACK
40.0000 mg | PACK | Freq: Every day | ORAL | Status: DC
  Filled 2019-09-06: qty 20

## 2019-09-06 MED ORDER — ATORVASTATIN CALCIUM 10 MG PO TABS
20.0000 mg | ORAL_TABLET | Freq: Every day | ORAL | Status: DC
  Administered 2019-09-06 – 2019-09-24 (×19): 20 mg via ORAL
  Filled 2019-09-06 (×19): qty 2

## 2019-09-06 MED ORDER — PRO-STAT SUGAR FREE PO LIQD
30.0000 mL | Freq: Two times a day (BID) | ORAL | Status: DC
  Administered 2019-09-06 – 2019-09-23 (×30): 30 mL via ORAL
  Filled 2019-09-06 (×30): qty 30

## 2019-09-06 MED ORDER — ACETAMINOPHEN 325 MG PO TABS
650.0000 mg | ORAL_TABLET | Freq: Four times a day (QID) | ORAL | Status: DC | PRN
  Administered 2019-09-10 – 2019-09-15 (×3): 650 mg via ORAL
  Filled 2019-09-06 (×3): qty 2

## 2019-09-06 MED ORDER — MIDODRINE HCL 5 MG PO TABS
ORAL_TABLET | ORAL | Status: AC
  Administered 2019-09-06: 10 mg via ORAL
  Filled 2019-09-06: qty 2

## 2019-09-06 MED ORDER — MIDODRINE HCL 5 MG PO TABS
10.0000 mg | ORAL_TABLET | Freq: Three times a day (TID) | ORAL | Status: DC
  Administered 2019-09-06 – 2019-09-07 (×6): 10 mg via ORAL
  Filled 2019-09-06 (×6): qty 2

## 2019-09-06 MED ORDER — HEPARIN SODIUM (PORCINE) 1000 UNIT/ML IJ SOLN
INTRAMUSCULAR | Status: AC
  Administered 2019-09-06: 3200 [IU] via INTRAVENOUS_CENTRAL
  Filled 2019-09-06: qty 4

## 2019-09-06 MED ORDER — DARBEPOETIN ALFA 100 MCG/0.5ML IJ SOSY
100.0000 ug | PREFILLED_SYRINGE | INTRAMUSCULAR | Status: DC
  Filled 2019-09-06: qty 0.5

## 2019-09-06 MED ORDER — DARBEPOETIN ALFA 100 MCG/0.5ML IJ SOSY
100.0000 ug | PREFILLED_SYRINGE | INTRAMUSCULAR | Status: AC
  Administered 2019-09-07: 100 ug via SUBCUTANEOUS
  Filled 2019-09-06: qty 0.5

## 2019-09-06 MED ORDER — VALPROIC ACID 250 MG PO CAPS
250.0000 mg | ORAL_CAPSULE | Freq: Four times a day (QID) | ORAL | Status: DC
  Administered 2019-09-06 – 2019-09-09 (×13): 250 mg via ORAL
  Filled 2019-09-06 (×19): qty 1

## 2019-09-06 MED ORDER — PANTOPRAZOLE SODIUM 40 MG PO TBEC
40.0000 mg | DELAYED_RELEASE_TABLET | Freq: Every day | ORAL | Status: DC
  Administered 2019-09-06 – 2019-09-24 (×19): 40 mg via ORAL
  Filled 2019-09-06 (×19): qty 1

## 2019-09-06 MED ORDER — VITAMIN B-1 100 MG PO TABS
100.0000 mg | ORAL_TABLET | Freq: Every day | ORAL | Status: DC
  Administered 2019-09-06 – 2019-09-24 (×19): 100 mg via ORAL
  Filled 2019-09-06 (×19): qty 1

## 2019-09-06 NOTE — Progress Notes (Signed)
ANTICOAGULATION CONSULT NOTE - Follow-Up Consult  Pharmacy Consult for Warfarin Indication: atrial fibrillation  Allergies  Allergen Reactions  . Codeine Other (See Comments)    Reaction not recalled by family- was told to never take this    Patient Measurements: Height: 5\' 6"  (167.6 cm) Weight: 162 lb 7.7 oz (73.7 kg) IBW/kg (Calculated) : 59.3  Vital Signs: Temp: 97.3 F (36.3 C) (11/25 0446) Temp Source: Oral (11/25 0446) BP: 135/75 (11/25 0413) Pulse Rate: 78 (11/25 0413)  Labs: Recent Labs    09/04/19 0608 09/04/19 1200 09/05/19 0357 09/05/19 1223 09/06/19 0352  HGB 7.9*  --  7.5*  --  7.8*  HCT 25.9*  --  24.8*  --  26.1*  PLT 331  --  346  --  363  LABPROT 22.8*  --  20.9*  --  21.0*  INR 2.0*  --  1.8*  --  1.8*  CREATININE  --  3.81*  --  2.69* 3.33*    Estimated Creatinine Clearance: 15.7 mL/min (A) (by C-G formula based on SCr of 3.33 mg/dL (H)).   Medical History: Past Medical History:  Diagnosis Date  . Anemia   . Chronic diastolic CHF (congestive heart failure) ([place])   . CKD (chronic kidney disease), stage IV ([place])   . Hypertension   . Persistent atrial fibrillation ([place])    a. dx 01/2019 s/p TEE DCCV.  Marland Kitchen Renal disorder   . Unilateral congenital absence of kidney    Assessment: Pt is a 73 yr old female admitted s/p out of hospital cardiac arrest and TTM. Admit c/b by encephalopathy, AKI which progressed to HD, and ?embolic CVA. Pt on apixaban PTA for Afib, which was held for IV heparin. Heparin subsequently held on 11/16 due to bleeding from HD lines and low Hgb. Warfarin started 11/18 with no bridge planned. Patient at amiodarone PO with no plans for DCCV.  INR remains slightly subtherapeutic at 1.8, H/H stable but low.   Goal of Therapy:  INR 2-3 Monitor platelets by anticoagulation protocol: Yes   Plan:  Warfarin 2.5 mg PO x1 again tonight Daily INR   Arrie Senate, PharmD, BCPS Clinical Pharmacist [phone] Please check AMION  for all [place] numbers 09/06/2019

## 2019-09-06 NOTE — TOC Progression Note (Signed)
Transition of Care [facility]) - Progression Note    Patient Details  Name: RENETHA VERO MRN: [id number] Date of Birth: Jun 11, 1946  Transition of Care [facility]) CM/SW Contact  Graves-Bigelow, Ocie Cornfield, RN Phone Number: 09/06/2019, 3:43 PM  Clinical Narrative: CM called [place] to start Authorization. Start date will be for Friday 09-08-19. Reference # is [id number]. Clinicals faxed to [phone]. Will await authorization. CM will continue to follow for transition of care needs.   Expected Discharge Plan: Long Term Acute Care (LTAC) Barriers to Discharge: Continued Medical Work up  Expected Discharge Plan and Services Expected Discharge Plan: [place] (LTAC) In-house Referral: NA Discharge Planning Services: CM Consult Post Acute Care Choice: [place] Living arrangements for the past 2 months: Single Family Home                    Social Determinants of Health (SDOH) Interventions    Readmission Risk Interventions Readmission Risk Prevention Plan 08/08/2019  Transportation Screening Complete  PCP or Specialist Appt within 3-5 Days Complete  HRI or Home Care Consult Complete  Medication Review (RN Care Manager) Complete  Some recent data might be hidden

## 2019-09-06 NOTE — Progress Notes (Signed)
PROGRESS NOTE  Jaclyn Day [id number] DOB: August 29, 1946   PCP: Cyndi Bender, PA-C  Patient is from: Home  DOA: 08/03/2019 LOS: 16  Brief Narrative / Interim history: 73 year old female with history of congenital unilateral kidney, CKD-4, HTN, persistent A. fib on Eliquis and diastolic CHF admitted to ICU 08/03/2023 cardiac arrest.  Found to have new systolic CHF with EF of 20 to 25%, global hypokinesis.  Blood culture grew MSSA for which she was started on vancomycin and transition to Ancef on 08/06/2019.  Patient had echocardiogram on 08/21/2019 that showed EF of 55 to 60%, moderate LAE and RVSP to 39.  Hospital course complicated by A. fib with RVR, acute metabolic encephalopathy/agitation and AKI.  She is a still with cortrack for feeding.  Subjective: No major events overnight of this morning.  No complaint this morning.  Denies chest pain, dyspnea, GI or GU symptoms.  Objective: Vitals:   09/06/19 1326 09/06/19 1420 09/06/19 1430 09/06/19 1431  BP:  127/61 (!) 121/59 121/65  Pulse: 90 78 77 78  Resp: 16 16    Temp:  98.7 F (37.1 C)    TempSrc:  Oral    SpO2: 100% 100%    Weight:  74.5 kg    Height:        Intake/Output Summary (Last 24 hours) at 09/06/2019 1508 Last data filed at 09/06/2019 0700 Gross per 24 hour  Intake 510 ml  Output -  Net 510 ml   Filed Weights   09/05/19 0445 09/06/19 0446 09/06/19 1420  Weight: 72.7 kg 73.7 kg 74.5 kg    Examination:  GENERAL: No acute distress.  Appears well.  HEENT: MMM.  Vision and hearing grossly intact.  Cortrack feeding tube in place. NECK: Supple.  No apparent JVD.  RESP:  No IWOB. Good air movement bilaterally. CVS:  RRR. Heart sounds normal.  ABD/GI/GU: Bowel sounds present. Soft. Non tender.  MSK/EXT:  No apparent deformity or edema. Moves extremities. SKIN: no apparent skin lesion or wound NEURO: Awake, alert and oriented appropriately.  No gross deficit.  PSYCH: Calm. Normal affect.   Assessment & Plan: Acute respiratory failure with hypoxia "multifactorial including cardiac arrest, pulmonary edema, bilateral pleural effusion and anxiety -treat treatable causes.  MSSA bacteremia/possible AV vegetation/endocarditis: Some concern about AV vegetation versus fibrous tissue.  -Plan to continue Ancef until 09/18/2019 per ID-total of 6 weeks  Cardiac arrest: Admitted to ICU with hypothermia protocol.  Intubated and extubated. -Cardiology managing.  A. fib with RVR: Stable on amiodarone and warfarin -Continue p.o. amiodarone and warfarin-cardiology managing  Acute systolic CHF: Initial TTE on 10/23 with EF of 20 to 25%.  Repeat TTE on 11/9 with EF of 55 to 60%.  Currently euvolemic except for trace edema. -Fluid management with dialysis  Acute metabolic encephalopathy: Resolved.  She is oriented x4 with good insight. -Frequent reorientation and delirium precautions.  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  Patient with significant upper and lower extremity weakness left greater than right. -Continue warfarin per pharmacy  Acute blood loss anemia: Reportedly bled from HD catheter on 11/15.  Transfused 1 unit.  H&H stable now. -Continue monitoring  AKI on CKD-4/BMD: Likely hemodynamically mediated in the setting of cardiac issues and bacteremia -CRRT 10/30-11/1 -IHD  -Continue monitoring  Hypotension: -Continue midodrine.  Dysphagia -Dysphagia 1 diet per SLP. -Feeding per cortrack  Leukocytosis/bandemia: Afebrile.  Due to MSSA bacteremia? -Continue monitoring  Oral thrush -Continue nystatin  Vitamin D  deficiency -On weekly vitamin D 50,000 units    Moderate malnutrition: Nutrition Problem: Moderate Malnutrition Etiology: acute illness(acute respiratory failure, AKI required CRRT, endocarditis)  Signs/Symptoms: mild fat depletion, mild muscle depletion, moderate muscle depletion   Interventions: Tube feeding   DVT prophylaxis: Subcu heparin Code Status: Full code Family Communication: Patient and/or RN. Available if any question. Disposition Plan: Remains inpatient Consultants: Nephrology, IR, neurology, palliative care, cardiology  Procedures:  HD cath placement CRRT IHD  Microbiology summarized: 10/22-COVID-19 negative 10/22-MRSA PCR negative 10/22-respiratory viral panel negative 10/22-urine culture with E. Coli 10/22-blood culture with MSSA in 1/2 bottles 10/26, 10/29, 11/16 and 11/20-blood cultures negative  Sch Meds:  Scheduled Meds: .  stroke: mapping our early stages of recovery book   Does not apply Once  . amiodarone  200 mg Oral Daily  . ARIPiprazole  10 mg Oral BID  . atorvastatin  20 mg Oral Daily  . chlorhexidine  15 mL Mouth Rinse BID  . Chlorhexidine Gluconate Cloth  6 each Topical Q0600  . cholecalciferol  1,000 Units Oral Daily  . [START ON 09/14/2019] darbepoetin (ARANESP) injection - DIALYSIS  100 mcg Intravenous Q Thu-HD  . [START ON 09/07/2019] darbepoetin (ARANESP) injection - NON-DIALYSIS  100 mcg Subcutaneous Q Thu-1800  . DULoxetine  30 mg Oral Daily  . feeding supplement  1 Container Oral TID BM  . feeding supplement (NEPRO CARB STEADY)  237 mL Oral BID BM  . feeding supplement (PRO-STAT SUGAR FREE 64)  30 mL Oral BID  . ferrous sulfate  325 mg Oral Daily  . insulin aspart  0-9 Units Subcutaneous Q4H  . mouth rinse  15 mL Mouth Rinse BID  . midodrine  10 mg Oral TID WC  . multivitamin  1 tablet Oral QHS  . nystatin  5 mL Oral QID  . pantoprazole  40 mg Oral Daily  . sodium chloride flush  10-40 mL Intracatheter Q12H  . thiamine  100 mg Oral Daily  . Thrombi-Pad  1 each Topical Once  . valproic acid  250 mg Oral Q6H  . warfarin  2.5 mg Oral ONCE-1800  . Warfarin - Pharmacist Dosing Inpatient   Does not apply q1800   Continuous Infusions: . sodium chloride 10 mL (08/20/19 2025)  . sodium chloride    . sodium  chloride    .  ceFAZolin (ANCEF) IV Stopped (09/04/19 2157)  . [START ON 09/09/2019]  ceFAZolin (ANCEF) IV    . feeding supplement (OSMOLITE 1.5 CAL) Stopped (09/06/19 0905)   PRN Meds:.sodium chloride, sodium chloride, acetaminophen, alteplase, heparin, hydrALAZINE, HYDROmorphone (DILAUDID) injection, levalbuterol, lidocaine (PF), lidocaine-prilocaine, pentafluoroprop-tetrafluoroeth, sodium chloride flush  Antimicrobials: Anti-infectives (From admission, onward)   Start     Dose/Rate Route Frequency Ordered Stop   09/09/19 1800  ceFAZolin (ANCEF) IVPB 2g/100 mL premix     2 g 200 mL/hr over 30 Minutes Intravenous Every T-Th-Sa (1800) 09/05/19 1024     09/04/19 1800  ceFAZolin (ANCEF) IVPB 2g/100 mL premix     2 g 200 mL/hr over 30 Minutes Intravenous Every M-W-F (1800) 09/04/19 1335 09/06/19 2359   08/31/19 1800  ceFAZolin (ANCEF) IVPB 2g/100 mL premix  Status:  Discontinued     2 g 200 mL/hr over 30 Minutes Intravenous Every T-Th-Sa (1800) 08/31/19 1401 09/04/19 1335   08/30/19 0900  ceFAZolin (ANCEF) IVPB 2g/100 mL premix     2 g 200 mL/hr over 30 Minutes Intravenous  Once 08/30/19 0854 08/30/19 1134   08/24/19 1200  ceFAZolin (ANCEF)  IVPB 2g/100 mL premix  Status:  Discontinued     2 g 200 mL/hr over 30 Minutes Intravenous Every T-Th-Sa (Hemodialysis) 08/23/19 0807 08/31/19 1401   08/20/19 2200  ceFAZolin (ANCEF) IVPB 1 g/50 mL premix  Status:  Discontinued     1 g 100 mL/hr over 30 Minutes Intravenous Daily at bedtime 08/20/19 1507 08/23/19 0807   08/15/19 2200  ceFAZolin (ANCEF) IVPB 2g/100 mL premix  Status:  Discontinued     2 g 200 mL/hr over 30 Minutes Intravenous Daily at bedtime 08/15/19 1133 08/20/19 1507   08/11/19 1800  ceFAZolin (ANCEF) IVPB 2g/100 mL premix  Status:  Discontinued     2 g 200 mL/hr over 30 Minutes Intravenous Every 12 hours 08/11/19 1110 08/15/19 1133   08/10/19 1600  ceFAZolin (ANCEF) IVPB 1 g/50 mL premix  Status:  Discontinued     1 g 100 mL/hr  over 30 Minutes Intravenous Every 12 hours 08/10/19 1530 08/11/19 1110   08/06/19 1800  ceFAZolin (ANCEF) IVPB 1 g/50 mL premix  Status:  Discontinued     1 g 100 mL/hr over 30 Minutes Intravenous Every 24 hours 08/06/19 1025 08/10/19 1530   08/05/19 1530  vancomycin (VANCOCIN) 1,750 mg in sodium chloride 0.9 % 500 mL IVPB     1,750 mg 250 mL/hr over 120 Minutes Intravenous  Once 08/05/19 1444 08/05/19 1736   08/05/19 1443  vancomycin variable dose per unstable renal function (pharmacist dosing)  Status:  Discontinued      Does not apply See admin instructions 08/05/19 1444 08/06/19 1025   08/03/19 1300  cefTRIAXone (ROCEPHIN) 2 g in sodium chloride 0.9 % 100 mL IVPB  Status:  Discontinued     2 g 200 mL/hr over 30 Minutes Intravenous Every 24 hours 08/03/19 1251 08/06/19 1025       I have personally reviewed the following labs and images: CBC: Recent Labs  Lab 09/02/19 0254 09/03/19 0325 09/04/19 0608 09/05/19 0357 09/06/19 0352  WBC 19.8* 19.8* 16.0* 17.3* 18.1*  HGB 8.0* 7.8* 7.9* 7.5* 7.8*  HCT 25.9* 25.7* 25.9* 24.8* 26.1*  MCV 91.5 92.8 92.8 94.3 95.3  PLT 334 357 331 346 363   BMP &GFR Recent Labs  Lab 09/01/19 0354  09/02/19 1304  09/04/19 0608 09/04/19 1200 09/05/19 0357 09/05/19 1223 09/06/19 0352  NA 136  --  136  --   --  136  --  139 137  K 4.1  --  5.4*  --   --  5.6*  --  4.9 5.1  CL 96*  --  97*  --   --  98  --  98 97*  CO2 27  --  27  --   --  29  --  30 28  GLUCOSE 133*  --  137*  --   --  96  --  106* 109*  BUN 23  --  48*  --   --  47*  --  27* 41*  CREATININE 2.42*  --  3.75*  --   --  3.81*  --  2.69* 3.33*  CALCIUM 8.9  --  9.3  --   --  9.2  --  9.6 9.5  PHOS 3.3   < > 4.0   < > 4.6 4.6 3.7 4.4 5.3*   < > = values in this interval not displayed.   Estimated Creatinine Clearance: 15.8 mL/min (A) (by C-G formula based on SCr of 3.33 mg/dL (H)). Liver & Pancreas: Recent  Labs  Lab 09/01/19 0354 09/02/19 1304 09/04/19 1200 09/05/19 1223  09/06/19 0352  ALBUMIN 1.4* 1.6* 1.6* 1.8* 1.7*   No results for input(s): LIPASE, AMYLASE in the last 168 hours. Recent Labs  Lab 08/31/19 0403  AMMONIA 22   Diabetic: No results for input(s): HGBA1C in the last 72 hours. Recent Labs  Lab 09/05/19 2012 09/05/19 2357 09/06/19 0410 09/06/19 0811 09/06/19 1127  GLUCAP 106* 95 92 110* 144*   Cardiac Enzymes: No results for input(s): CKTOTAL, CKMB, CKMBINDEX, TROPONINI in the last 168 hours. No results for input(s): PROBNP in the last 8760 hours. Coagulation Profile: Recent Labs  Lab 09/02/19 0254 09/03/19 0325 09/04/19 0608 09/05/19 0357 09/06/19 0352  INR 3.4* 2.3* 2.0* 1.8* 1.8*   Thyroid Function Tests: No results for input(s): TSH, T4TOTAL, FREET4, T3FREE, THYROIDAB in the last 72 hours. Lipid Profile: No results for input(s): CHOL, HDL, LDLCALC, TRIG, CHOLHDL, LDLDIRECT in the last 72 hours. Anemia Panel: No results for input(s): VITAMINB12, FOLATE, FERRITIN, TIBC, IRON, RETICCTPCT in the last 72 hours. Urine analysis:    Component Value Date/Time   COLORURINE BROWN (A) 08/05/2019 2018   APPEARANCEUR TURBID (A) 08/05/2019 2018   LABSPEC RESULTS UNAVAILABLE DUE TO INTERFERING SUBSTANCE 08/05/2019 2018   PHURINE RESULTS UNAVAILABLE DUE TO INTERFERING SUBSTANCE 08/05/2019 2018   GLUCOSEU RESULTS UNAVAILABLE DUE TO INTERFERING SUBSTANCE (A) 08/05/2019 2018   HGBUR RESULTS UNAVAILABLE DUE TO INTERFERING SUBSTANCE (A) 08/05/2019 2018   BILIRUBINUR RESULTS UNAVAILABLE DUE TO INTERFERING SUBSTANCE (A) 08/05/2019 2018   KETONESUR RESULTS UNAVAILABLE DUE TO INTERFERING SUBSTANCE (A) 08/05/2019 2018   PROTEINUR RESULTS UNAVAILABLE DUE TO INTERFERING SUBSTANCE (A) 08/05/2019 2018   UROBILINOGEN 0.2 05/07/2009 1520   NITRITE RESULTS UNAVAILABLE DUE TO INTERFERING SUBSTANCE (A) 08/05/2019 2018   LEUKOCYTESUR RESULTS UNAVAILABLE DUE TO INTERFERING SUBSTANCE (A) 08/05/2019 2018   Sepsis Labs: Invalid input(s):  PROCALCITONIN, [place]  Microbiology: Recent Results (from the past 240 hour(s))  Culture, blood (routine x 2)     Status: None   Collection Time: 08/28/19 12:00 PM   Specimen: BLOOD  Result Value Ref Range Status   Specimen Description BLOOD BLOOD LEFT HAND  Final   Special Requests   Final    AEROBIC BOTTLE ONLY Blood Culture results may not be optimal due to an inadequate volume of blood received in culture bottles   Culture   Final    NO GROWTH 5 DAYS Performed at [facility], [place] [address], [place], [place] [address]    Report Status 09/02/2019 FINAL  Final  Culture, blood (routine x 2)     Status: None   Collection Time: 08/28/19 12:00 PM   Specimen: BLOOD  Result Value Ref Range Status   Specimen Description BLOOD LEFT ANTECUBITAL  Final   Special Requests   Final    AEROBIC BOTTLE ONLY Blood Culture results may not be optimal due to an inadequate volume of blood received in culture bottles   Culture   Final    NO GROWTH 5 DAYS Performed at [facility], [place] [address], [place], [place] [address]    Report Status 09/02/2019 FINAL  Final  Culture, blood (routine x 2)     Status: None   Collection Time: 09/01/19  4:26 PM   Specimen: BLOOD LEFT HAND  Result Value Ref Range Status   Specimen Description BLOOD LEFT HAND  Final   Special Requests   Final    BOTTLES DRAWN AEROBIC ONLY Blood Culture adequate volume  Culture   Final    NO GROWTH 5 DAYS Performed at [facility], [place] [address], [place], [place] [address]    Report Status 09/06/2019 FINAL  Final  Culture, blood (routine x 2)     Status: None   Collection Time: 09/01/19  4:26 PM   Specimen: BLOOD LEFT HAND  Result Value Ref Range Status   Specimen Description BLOOD LEFT HAND  Final   Special Requests   Final    BOTTLES DRAWN AEROBIC ONLY Blood Culture adequate volume   Culture   Final    NO GROWTH 5 DAYS Performed at [facility], [place] [address] [place],  [address]     Report Status 09/06/2019 FINAL  Final    Radiology Studies: No results found.  45 minutes with more than 50% spent in reviewing records, counseling patient/family and coordinating care.  Taye T. Haviland  If 7PM-7AM, please contact night-coverage [email or web address] Password [facility] 09/06/2019, 3:08 PM

## 2019-09-06 NOTE — TOC Progression Note (Signed)
Transition of Care [facility]) - Progression Note    Patient Details  Name: Jaclyn Day MRN: [id number] Date of Birth: 11-04-1945  [facility]) CM/SW Contact  Graves-Bigelow, Ocie Cornfield, RN Phone Number: 09/06/2019, 2:36 PM  Clinical Narrative: Patient's expedited  Appeal to LTAC declined. Significant other is calling [employer] daily to see if decision can be overturned. Pt continues with [facility]. CM has been working with [place] @ [phone]. CM has offered choice for SNF- [facility] and [facility] faxed out to them. [facility] is Pending PASRR. Patient is new HD- will need a HD center near facility so they can transport. CM to call [place] for Authorization. CM will continue to follow for transition of care needs.    Expected Discharge Plan: Long Term Acute Care (LTAC) Barriers to Discharge: Continued Medical Work up  Expected Discharge Plan and Services Expected Discharge Plan: [place] (LTAC) In-house Referral: NA Discharge Planning Services: CM Consult Post Acute Care Choice: [place] Living arrangements for the past 2 months: Single Family Home                   Social Determinants of Health (SDOH) Interventions    Readmission Risk Interventions Readmission Risk Prevention Plan 08/08/2019  Transportation Screening Complete  PCP or Specialist Appt within 3-5 Days Complete  HRI or Home Care Consult Complete  Medication Review (RN Care Manager) Complete  Some recent data might be hidden

## 2019-09-06 NOTE — Progress Notes (Signed)
[facility] Progress Note    Assessment/ Plan:   # Dialysis dependent AoCKD4: CKD due to hypertension.  AKI likely ATN in the setting of shock, cardiac arrest and bacteremia.  She was on CRRT 10/30-11/1.   -Kidney ultrasound showed solitary right kidney with cortical thinning and cysts. -Seen by palliative, full scope of care, potentially to LTAC?  - s/p TDC with IR 11/11 - Normally iHD per T Th Sat Schedule:  - Next HD on 11/25 per holiday schedule then resume TTS - cont to eval for possible AVF or AVG; had previously been felt to be too debilitated currently.  Note also on warfarin    - given renal failure would transition off of cymbalta   #Hyperkalemia: for HD  #Acute encephalopathy: somewhat improved but still present.  11/7 MRI with scattered small acute infarcts and chronic infarcts noted.  #Cardiac arrest, acute on chronic systolic CHF: EF [id number] by echo, cardiology is following.  EF improved in TEE.  #MSSA bacteremia: abx per primary team.  End date six weeks from last negative culture 08/07/19 - 09/18/19.  #Hypotension: Stable.  Continue midodrine.  #Acute respiratory failure with hypoxia: HD with UF. Continue supportive care.   #Paroxysmal atrial fibrillation: Cardiology following.  On amiodarone. Anticoagulation per primary   #Anemia - multifactorial with renal failure: hgb 5.9 11/15 in setting of TDC ooze- IR has seen and stitched, Got 1 u PRBCs, on Aranesp 100 q Thursday   #Dispo: ? LTAC  Subjective:    Feels ok today.  Team looking at [place] - was denied at [place].  She last dialyzed on 11/23 - paper charting given staff without access to Epic. 2.5 UF ? Includes rinse back?  Review of systems   Denies shortness of breath or chest pain   Denies nausea or vomiting     Objective:   BP 126/89 (BP Location: Left Leg)   Pulse 89   Temp (!) 97.3 F (36.3 C) (Tympanic)   Resp (!) 29   Ht 5\' 6"  (1.676 m)   Wt 73.7 kg   SpO2 100%    BMI 26.22 kg/m   Intake/Output Summary (Last 24 hours) at 09/06/2019 1132 Last data filed at 09/06/2019 0700 Gross per 24 hour  Intake 570 ml  Output -  Net 570 ml   Weight change: 1 kg  Physical Exam: General: NAD, awake   HEENT: NCAT sclera anicteric Heart: s1s2 nl, no rubs Lungs: clear anteriorly unlabored  Abdomen:soft, Non-tender, non-distended Extremities: no pitting edema lower extremities  Neuro patient oriented to person and location; year is "March 2009" Dialysis Access: [facility] c/d/i  Imaging: No results found.  Labs: BMET Recent Labs  Lab 08/31/19 1300 09/01/19 0354  09/02/19 1304 09/03/19 0325 09/04/19 0608 09/04/19 1200 09/05/19 0357 09/05/19 1223 09/06/19 0352  NA 135 136  --  136  --   --  136  --  139 137  K 5.2* 4.1  --  5.4*  --   --  5.6*  --  4.9 5.1  CL 93* 96*  --  97*  --   --  98  --  98 97*  CO2 27 27  --  27  --   --  29  --  30 28  GLUCOSE 138* 133*  --  137*  --   --  96  --  106* 109*  BUN 44* 23  --  48*  --   --  47*  --  27* 41*  CREATININE 4.08* 2.42*  --  3.75*  --   --  3.81*  --  2.69* 3.33*  CALCIUM 9.1 8.9  --  9.3  --   --  9.2  --  9.6 9.5  PHOS 3.9 3.3   < > 4.0 3.0 4.6 4.6 3.7 4.4 5.3*   < > = values in this interval not displayed.   CBC Recent Labs  Lab 09/03/19 0325 09/04/19 0608 09/05/19 0357 09/06/19 0352  WBC 19.8* 16.0* 17.3* 18.1*  HGB 7.8* 7.9* 7.5* 7.8*  HCT 25.7* 25.9* 24.8* 26.1*  MCV 92.8 92.8 94.3 95.3  PLT 357 331 346 363    Medications:    .  stroke: mapping our early stages of recovery book   Does not apply Once  . amiodarone  200 mg Oral Daily  . ARIPiprazole  10 mg Oral BID  . atorvastatin  20 mg Oral Daily  . chlorhexidine  15 mL Mouth Rinse BID  . Chlorhexidine Gluconate Cloth  6 each Topical Q0600  . cholecalciferol  1,000 Units Oral Daily  . [START ON 09/14/2019] darbepoetin (ARANESP) injection - DIALYSIS  100 mcg Intravenous Q Thu-HD  . [START ON 09/07/2019] darbepoetin (ARANESP)  injection - NON-DIALYSIS  100 mcg Subcutaneous Q Thu-1800  . DULoxetine  30 mg Oral Daily  . feeding supplement  1 Container Oral TID BM  . feeding supplement (NEPRO CARB STEADY)  237 mL Oral BID BM  . feeding supplement (PRO-STAT SUGAR FREE 64)  30 mL Oral BID  . ferrous sulfate  325 mg Oral Daily  . insulin aspart  0-9 Units Subcutaneous Q4H  . mouth rinse  15 mL Mouth Rinse BID  . midodrine  10 mg Oral TID WC  . multivitamin  1 tablet Oral QHS  . nystatin  5 mL Oral QID  . pantoprazole  40 mg Oral Daily  . sodium chloride flush  10-40 mL Intracatheter Q12H  . thiamine  100 mg Oral Daily  . Thrombi-Pad  1 each Topical Once  . valproic acid  250 mg Oral Q6H  . warfarin  2.5 mg Oral ONCE-1800  . Warfarin - Pharmacist Dosing Inpatient   Does not apply q1800    Claudia Desanctis 09/06/2019, 11:32 AM

## 2019-09-06 NOTE — Progress Notes (Signed)
Pt currently off BiPAP/CPAP tolerating well. RT will continue to monitor pt.

## 2019-09-06 NOTE — NC FL2 (Addendum)
[place] LEVEL OF CARE SCREENING TOOL     IDENTIFICATION  Patient Name: Jaclyn Day Birthdate: 09-Mar-1946 Sex: female Admission Date (Current Location): 08/03/2019  [place] and [place] Number:  Kathleen Argue [id number] [place] and Address:         Provider Number: [id number]  Attending Physician Name and Address:  Mercy Riding, MD  Relative Name and Phone Number:  Ruthe Mannan- Significant [phone]    Current Level of Care: Hospital Recommended Level of Care: [place] Prior Approval Number:    Date Approved/Denied:   PASRR Number: [id number] E Expires 12/31 Discharge Plan: SNF    Current Diagnoses: Patient Active Problem List   Diagnosis Date Noted  . Cerebral embolism with cerebral infarction 08/22/2019  . Advanced care planning/counseling discussion   . Goals of care, counseling/discussion   . Palliative care by specialist   . Closed fracture of multiple ribs   . Malnutrition of moderate degree 08/16/2019  . Acute bacterial endocarditis   . Acute on chronic combined systolic and diastolic CHF (congestive heart failure) ([place])   . MSSA bacteremia 08/07/2019  . AKI (acute kidney injury) ([place])   . Elevated troponin   . Persistent atrial fibrillation ([place])   . Acute respiratory failure with hypoxia ([place])   . Cardiac arrest ([place]) 08/03/2019  . Pressure injury of skin 08/03/2019  . Atrial fibrillation with RVR ([place]) 01/25/2019  . CHF (congestive heart failure) ([place]) 01/25/2019  . Hypoxia   . Renal insufficiency   . Renal disorder   . Hypertension     Orientation RESPIRATION BLADDER Height & Weight     Self, Place, Situation  O2(@ 2 Liters via ) Incontinent Weight: 73.7 kg Height:  5\' 6"  (167.6 cm)  BEHAVIORAL SYMPTOMS/MOOD NEUROLOGICAL BOWEL NUTRITION STATUS      Incontinent DYS 1 diet  AMBULATORY STATUS COMMUNICATION OF NEEDS Skin   Extensive Assist Verbally Normal                       Personal Care  Assistance Level of Assistance  Bathing, Feeding, Dressing Bathing Assistance: Maximum assistance Feeding assistance: Maximum assistance Dressing Assistance: Maximum assistance     Functional Limitations Info  Sight, Hearing, Speech Sight Info: Adequate Hearing Info: Adequate Speech Info: Adequate(SLP working with patient regarding diet.)    SPECIAL CARE FACTORS FREQUENCY  PT (By licensed PT), OT (By licensed OT), Speech therapy     PT Frequency: 5 x week OT Frequency: 5 x week     Speech Therapy Frequency: continue plan of care      Contractures Contractures Info: Not present    Additional Factors Info  Code Status, Allergies, Psychotropic, Insulin Sliding Scale Code Status Info: Full Allergies Info: Codeine Psychotropic Info: Abilify 10 mg bid, cymbalta DR 30 mg qd, Depakene 250 mg po q 6 hours Insulin Sliding Scale Info: insulin aspart (novoLOG) injection 0-9 Units  :  Dose 0-9 Units  :  Subcutaneous  :  Every 4 hours       Current Medications (09/06/2019):  This is the current hospital active medication list Current Facility-Administered Medications  Medication Dose Route Frequency Provider Last Rate Last Dose  .  stroke: mapping our early stages of recovery book   Does not apply Once Amie Portland, MD      . 0.9 %  sodium chloride infusion  250 mL Intravenous Continuous Audria Nine, DO 10 mL/hr at 08/20/19 2025 10 mL at 08/20/19 2025  . 0.9 %  sodium chloride infusion  100 mL Intravenous PRN Audria Nine, DO      . 0.9 %  sodium chloride infusion  100 mL Intravenous PRN Audria Nine, DO      . acetaminophen (TYLENOL) tablet 650 mg  650 mg Oral Q6H PRN Einar Grad, RPH      . alteplase (CATHFLO ACTIVASE) injection 2 mg  2 mg Intracatheter Once PRN Audria Nine, DO      . amiodarone (PACERONE) tablet 200 mg  200 mg Oral Daily Jerline Pain, MD   200 mg at 09/06/19 0954  . ARIPiprazole (ABILIFY) tablet 10 mg  10 mg Oral BID Nolberto Hanlon, MD    10 mg at 09/06/19 0955  . atorvastatin (LIPITOR) tablet 20 mg  20 mg Oral Daily Donnamae Jude, RPH   20 mg at 09/06/19 [id number]  . ceFAZolin (ANCEF) IVPB 2g/100 mL premix  2 g Intravenous Q M,W,F-1800 Einar Grad, [facility]   Stopped at 09/04/19 2157  . [START ON 09/09/2019] ceFAZolin (ANCEF) IVPB 2g/100 mL premix  2 g Intravenous Q T,Th,Sat-1800 Einar Grad, RPH      . chlorhexidine (PERIDEX) 0.12 % solution 15 mL  15 mL Mouth Rinse BID Audria Nine, DO   15 mL at 09/06/19 1014  . Chlorhexidine Gluconate Cloth 2 % PADS 6 each  6 each Topical Q0600 Claudia Desanctis, MD   6 each at 09/06/19 [phone]  . cholecalciferol (VITAMIN D3) tablet 1,000 Units  1,000 Units Oral Daily Regalado, Belkys A, MD   1,000 Units at 09/06/19 0955  . [START ON 09/14/2019] Darbepoetin Alfa (ARANESP) injection 100 mcg  100 mcg Intravenous Q Thu-HD Einar Grad, RPH      . [START ON 09/07/2019] Darbepoetin Alfa (ARANESP) injection 100 mcg  100 mcg Subcutaneous Q Thu-1800 Einar Grad, [facility]      . DULoxetine (CYMBALTA) DR capsule 30 mg  30 mg Oral Daily Regalado, Belkys A, MD   30 mg at 09/06/19 1000  . feeding supplement (BOOST / RESOURCE BREEZE) liquid 1 Container  1 Container Oral TID BM Regalado, Belkys A, MD   1 Container at 09/06/19 1026  . feeding supplement (NEPRO CARB STEADY) liquid 237 mL  237 mL Oral BID BM Nolberto Hanlon, MD   237 mL at 09/05/19 1332  . feeding supplement (OSMOLITE 1.5 CAL) liquid 1,000 mL  1,000 mL Per Tube Continuous Regalado, Belkys A, MD   Stopped at 09/06/19 0905  . feeding supplement (PRO-STAT SUGAR FREE 64) liquid 30 mL  30 mL Oral BID Einar Grad, RPH   30 mL at 09/06/19 1012  . ferrous sulfate tablet 325 mg  325 mg Oral Daily Donnamae Jude, [place]   325 mg at 09/06/19 [id number]  . heparin injection 1,000 Units  1,000 Units Dialysis PRN Audria Nine, DO   3,200 Units at 09/02/19 1723  . hydrALAZINE (APRESOLINE) injection 10 mg  10 mg Intravenous Q6H PRN Audria Nine, DO    10 mg at 08/14/19 [id number]  . HYDROmorphone (DILAUDID) injection 0.25 mg  0.25 mg Intravenous Q4H PRN Earlie Counts, NP   0.25 mg at 08/22/19 2244  . insulin aspart (novoLOG) injection 0-9 Units  0-9 Units Subcutaneous Q4H Audria Nine, DO   1 Units at 09/06/19 1303  . levalbuterol (XOPENEX) nebulizer solution 0.63 mg  0.63 mg Nebulization Q6H PRN Gardiner Barefoot, NP   0.63 mg at 08/21/19 2152  . lidocaine (PF) (XYLOCAINE) 1 % injection 5  mL  5 mL Intradermal PRN Audria Nine, DO      . lidocaine-prilocaine (EMLA) cream 1 application  1 application Topical PRN Audria Nine, DO      . MEDLINE mouth rinse  15 mL Mouth Rinse BID Audria Nine, DO   15 mL at 09/06/19 1317  . midodrine (PROAMATINE) tablet 10 mg  10 mg Oral TID WC Donnamae Jude, RPH   10 mg at 09/06/19 1308  . multivitamin (RENA-VIT) tablet 1 tablet  1 tablet Oral QHS Nolberto Hanlon, MD   1 tablet at 09/05/19 2107  . nystatin (MYCOSTATIN) 100000 UNIT/ML suspension 500,000 Units  5 mL Oral QID Regalado, Belkys A, MD   500,000 Units at 09/06/19 1016  . pantoprazole (PROTONIX) EC tablet 40 mg  40 mg Oral Daily Einar Grad, RPH   40 mg at 09/06/19 1022  . pentafluoroprop-tetrafluoroeth (GEBAUERS) aerosol 1 application  1 application Topical PRN Audria Nine, DO      . sodium chloride flush (NS) 0.9 % injection 10-40 mL  10-40 mL Intracatheter Q12H Audria Nine, DO   10 mL at 09/06/19 1025  . sodium chloride flush (NS) 0.9 % injection 10-40 mL  10-40 mL Intracatheter PRN Audria Nine, DO   10 mL at 08/23/19 1029  . thiamine (VITAMIN B-1) tablet 100 mg  100 mg Oral Daily Donnamae Jude, RPH   100 mg at 09/06/19 [id number]  . Thrombi-Pad 3"X3" pad 1 each  1 each Topical Once Audria Nine, DO      . valproic acid (DEPAKENE) 250 MG capsule 250 mg  250 mg Oral Q6H Donnamae Jude, RPH   250 mg at 09/06/19 1306  . warfarin (COUMADIN) tablet 2.5 mg  2.5 mg Oral ONCE-1800 Einar Grad, [facility]      . Warfarin  - Pharmacist Dosing Inpatient   Does not apply q1800 Bertis Ruddy, [facility]   1 each at 09/03/19 [id number]     Discharge Medications: Please see discharge summary for a list of discharge medications.  Relevant Imaging Results:  Relevant Lab Results:   Additional Information SS# [id number]  Bethena Roys, RN
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
## 2019-09-07 DIAGNOSIS — I469 Cardiac arrest, cause unspecified: Secondary | ICD-10-CM | POA: Diagnosis not present

## 2019-09-07 DIAGNOSIS — I330 Acute and subacute infective endocarditis: Secondary | ICD-10-CM | POA: Diagnosis not present

## 2019-09-07 DIAGNOSIS — R7881 Bacteremia: Secondary | ICD-10-CM | POA: Diagnosis not present

## 2019-09-07 DIAGNOSIS — I5043 Acute on chronic combined systolic (congestive) and diastolic (congestive) heart failure: Secondary | ICD-10-CM | POA: Diagnosis not present

## 2019-09-07 LAB — RENAL FUNCTION PANEL
Albumin: 1.7 g/dL — ABNORMAL LOW (ref 3.5–5.0)
Anion gap: 10 (ref 5–15)
BUN: 18 mg/dL (ref 8–23)
CO2: 30 mmol/L (ref 22–32)
Calcium: 8.5 mg/dL — ABNORMAL LOW (ref 8.9–10.3)
Chloride: 96 mmol/L — ABNORMAL LOW (ref 98–111)
Creatinine, Ser: 1.72 mg/dL — ABNORMAL HIGH (ref 0.44–1.00)
GFR calc Af Amer: 34 mL/min — ABNORMAL LOW (ref >60)
GFR calc non Af Amer: 29 mL/min — ABNORMAL LOW (ref >60)
Glucose, Bld: 109 mg/dL — ABNORMAL HIGH (ref 70–99)
Phosphorus: 3.4 mg/dL (ref 2.5–4.6)
Potassium: 3.9 mmol/L (ref 3.5–5.1)
Sodium: 136 mmol/L (ref 135–145)

## 2019-09-07 LAB — GLUCOSE, CAPILLARY
Glucose-Capillary: 111 mg/dL — ABNORMAL HIGH (ref 70–99)
Glucose-Capillary: 111 mg/dL — ABNORMAL HIGH (ref 70–99)
Glucose-Capillary: 117 mg/dL — ABNORMAL HIGH (ref 70–99)
Glucose-Capillary: 124 mg/dL — ABNORMAL HIGH (ref 70–99)
Glucose-Capillary: 157 mg/dL — ABNORMAL HIGH (ref 70–99)
Glucose-Capillary: 99 mg/dL (ref 70–99)

## 2019-09-07 LAB — CBC
HCT: 24.9 % — ABNORMAL LOW (ref 36.0–46.0)
Hemoglobin: 7.6 g/dL — ABNORMAL LOW (ref 12.0–15.0)
MCH: 28.8 pg (ref 26.0–34.0)
MCHC: 30.5 g/dL (ref 30.0–36.0)
MCV: 94.3 fL (ref 80.0–100.0)
Platelets: 331 10*3/uL (ref 150–400)
RBC: 2.64 MIL/uL — ABNORMAL LOW (ref 3.87–5.11)
RDW: 16.8 % — ABNORMAL HIGH (ref 11.5–15.5)
WBC: 16.3 10*3/uL — ABNORMAL HIGH (ref 4.0–10.5)
nRBC: 0 % (ref 0.0–0.2)

## 2019-09-07 LAB — MAGNESIUM: Magnesium: 1.9 mg/dL (ref 1.7–2.4)

## 2019-09-07 LAB — PREPARE RBC (CROSSMATCH)

## 2019-09-07 LAB — PROTIME-INR
INR: 2 — ABNORMAL HIGH (ref 0.8–1.2)
Prothrombin Time: 22.4 seconds — ABNORMAL HIGH (ref 11.4–15.2)

## 2019-09-07 MED ORDER — SODIUM CHLORIDE 0.9% IV SOLUTION: Freq: Once | INTRAVENOUS | Status: DC

## 2019-09-07 MED ORDER — WARFARIN SODIUM 2.5 MG PO TABS
2.5000 mg | ORAL_TABLET | Freq: Once | ORAL | Status: AC
  Administered 2019-09-07: 2.5 mg via ORAL
  Filled 2019-09-07: qty 1

## 2019-09-07 NOTE — Progress Notes (Signed)
ANTICOAGULATION CONSULT NOTE - Follow-Up Consult  Pharmacy Consult for Warfarin Indication: atrial fibrillation  Allergies  Allergen Reactions  . Codeine Other (See Comments)    Reaction not recalled by family- was told to never take this    Patient Measurements: Height: 5\' 6"  (167.6 cm) Weight: 161 lb 2.5 oz (73.1 kg) IBW/kg (Calculated) : 59.3  Vital Signs: Temp: 98.2 F (36.8 C) (11/26 0402) Temp Source: Oral (11/26 0011) BP: 123/52 (11/26 0402) Pulse Rate: 85 (11/26 0402)  Labs: Recent Labs    09/05/19 0357 09/05/19 1223 09/06/19 0352 09/07/19 0315  HGB 7.5*  --  7.8* 7.6*  HCT 24.8*  --  26.1* 24.9*  PLT 346  --  363 331  LABPROT 20.9*  --  21.0* 22.4*  INR 1.8*  --  1.8* 2.0*  CREATININE  --  2.69* 3.33* 1.72*    Estimated Creatinine Clearance: 30.2 mL/min (A) (by C-G formula based on SCr of 1.72 mg/dL (H)).   Medical History: Past Medical History:  Diagnosis Date  . Anemia   . Chronic diastolic CHF (congestive heart failure) ([place])   . CKD (chronic kidney disease), stage IV ([place])   . Hypertension   . Persistent atrial fibrillation ([place])    a. dx 01/2019 s/p TEE DCCV.  Mar[place] Kitchen Renal disorder   . Unilateral congenital absence of kidney    Assessment: Pt is a 73 yr old female admitted s/p out of hospital cardiac arrest and TTM. Admit c/b by encephalopathy, AKI which progressed to HD, and ?embolic CVA. Pt on apixaban PTA for Afib, which was held for IV heparin. Heparin subsequently held on 11/16 due to bleeding from HD lines and low Hgb. Warfarin started 11/18 with no bridge planned. Patient at amiodarone PO with no plans for DCCV.  INR at goal of 2, H/H stable but low.   Goal of Therapy:  INR 2-3 Monitor platelets by anticoagulation protocol: Yes   Plan:  Warfarin 2.5 mg PO x1 again tonight Daily INR Monitor for s/sx of bleeding   Lorel Monaco, PharmD PGY1 Ambulatory Care Resident [employer] # [phone]

## 2019-09-07 NOTE — Progress Notes (Signed)
[facility] Progress Note    Assessment/ Plan:   # Dialysis dependent AKI: AKI likely ATN in the setting of shock, cardiac arrest and bacteremia.  She was on CRRT 10/30-11/1.  Kidney ultrasound showed solitary right kidney with cortical thinning and cysts.  Seen by palliative, full scope of care, potentially to LTAC?  s/p TDC with IR 11/11.   - Normally iHD per T Th Sat Schedule.  She had HD on 11/25 per holiday schedule - Clearance improved on today's labs.  Follow for any renal improvement; charted as anuric.  Will plan for next HD on 11/28 per TTS schedule again  - Cont to eval for possible AVF or AVG; had previously been felt to be too debilitated.  Note also on warfarin    - given renal failure would transition off of cymbalta   # CKD stage IV - baseline Cr appears near 2.5 per limited Epic data.  CKD felt 2/2 HTN  #Hyperkalemia: managed with HD  #Acute encephalopathy: somewhat improved but still present.  11/7 MRI with scattered small acute infarcts and chronic infarcts noted.  #Cardiac arrest, acute on chronic systolic CHF: EF [id number] by echo, cardiology is following.  EF improved in TEE.  #MSSA bacteremia: abx per primary team.  End date six weeks from last negative culture 08/07/19 - 09/18/19.  #Hypotension: Stable.  Continue midodrine.  #Acute respiratory failure with hypoxia: HD with UF. Continue supportive care.   #Paroxysmal atrial fibrillation: Cardiology following.  On amiodarone. Anticoagulation per primary   #Anemia - multifactorial with renal failure: hgb 5.9 11/15 in setting of TDC ooze- IR has seen and stitched, Got 1 u PRBCs, on Aranesp 100 q Thursday   #Dispo: ? LTAC  Subjective:    Last spoke with SW/dialysis coordinator on 11/25; pt was denied at [facility].  Had been looking for LTAC facility.  Last HD on 11/25 with 1.5 kg UF.  She denies any dizziness or cramping  Review of systems   Denies shortness of breath or chest pain   Denies  nausea or vomiting     Objective:   BP 119/76 (BP Location: Left Leg)   Pulse (!) 106   Temp 98.2 F (36.8 C)   Resp 17   Ht 5\' 6"  (1.676 m)   Wt 73.1 kg   SpO2 100%   BMI 26.01 kg/m   Intake/Output Summary (Last 24 hours) at 09/07/2019 1240 Last data filed at 09/06/2019 2200 Gross per 24 hour  Intake 10 ml  Output 1500 ml  Net -1490 ml   Physical Exam: General: NAD, awake    HEENT: NCAT sclera anicteric Heart: s1s2, no rub Lungs: clear to auscultation and normal work of breathing Abdomen:soft, Non-tender, non-distended Extremities: no pitting edema lower extremities; LUE 1+ edema on pillow Dialysis Access: RIJ TDC c/d/i Neuro - year is "42"; oriented to person and location of [facility]  Imaging: No results found.  Labs: BMET Recent Labs  Lab 08/31/19 1300 09/01/19 0354  09/02/19 1304 09/03/19 0325 09/04/19 [id number] 09/04/19 1200 09/05/19 0357 09/05/19 1223 09/06/19 0352 09/07/19 0315  NA 135 136  --  136  --   --  136  --  139 137 136  K 5.2* 4.1  --  5.4*  --   --  5.6*  --  4.9 5.1 3.9  CL 93* 96*  --  97*  --   --  98  --  98 97* 96*  CO2 27 27  --  27  --   --  29  --  30 28 30   GLUCOSE 138* 133*  --  137*  --   --  96  --  106* 109* 109*  BUN 44* 23  --  48*  --   --  47*  --  27* 41* 18  CREATININE 4.08* 2.42*  --  3.75*  --   --  3.81*  --  2.69* 3.33* 1.72*  CALCIUM 9.1 8.9  --  9.3  --   --  9.2  --  9.6 9.5 8.5*  PHOS 3.9 3.3   < > 4.0 3.0 4.6 4.6 3.7 4.4 5.3* 3.4   < > = values in this interval not displayed.   CBC Recent Labs  Lab 09/04/19 0608 09/05/19 0357 09/06/19 0352 09/07/19 0315  WBC 16.0* 17.3* 18.1* 16.3*  HGB 7.9* 7.5* 7.8* 7.6*  HCT 25.9* 24.8* 26.1* 24.9*  MCV 92.8 94.3 95.3 94.3  PLT 331 346 363 331    Medications:    .  stroke: mapping our early stages of recovery book   Does not apply Once  . sodium chloride   Intravenous Once  . amiodarone  200 mg Oral Daily  . ARIPiprazole  10 mg Oral BID  . atorvastatin  20 mg Oral  Daily  . chlorhexidine  15 mL Mouth Rinse BID  . Chlorhexidine Gluconate Cloth  6 each Topical Q0600  . cholecalciferol  1,000 Units Oral Daily  . [START ON 09/14/2019] darbepoetin (ARANESP) injection - DIALYSIS  100 mcg Intravenous Q Thu-HD  . darbepoetin (ARANESP) injection - NON-DIALYSIS  100 mcg Subcutaneous Q Thu-1800  . DULoxetine  30 mg Oral Daily  . feeding supplement  1 Container Oral TID BM  . feeding supplement (NEPRO CARB STEADY)  237 mL Oral BID BM  . feeding supplement (PRO-STAT SUGAR FREE 64)  30 mL Oral BID  . ferrous sulfate  325 mg Oral Daily  . insulin aspart  0-9 Units Subcutaneous Q4H  . mouth rinse  15 mL Mouth Rinse BID  . midodrine  10 mg Oral TID WC  . multivitamin  1 tablet Oral QHS  . nystatin  5 mL Oral QID  . pantoprazole  40 mg Oral Daily  . sodium chloride flush  10-40 mL Intracatheter Q12H  . thiamine  100 mg Oral Daily  . Thrombi-Pad  1 each Topical Once  . valproic acid  250 mg Oral Q6H  . warfarin  2.5 mg Oral ONCE-1800  . Warfarin - Pharmacist Dosing Inpatient   Does not apply q1800    Claudia Desanctis 09/07/2019, 12:40 PM

## 2019-09-07 NOTE — Progress Notes (Signed)
Pt off BiPAP doing well at this time. RT will continue to monitor.

## 2019-09-07 NOTE — Progress Notes (Signed)
PROGRESS NOTE  Jaclyn Day [id number] DOB: 1946/07/12   PCP: Cyndi Bender, PA-C  Patient is from: Home  DOA: 08/03/2019 LOS: 10  Brief Narrative / Interim history: 73 year old female with history of congenital unilateral kidney, CKD-4, HTN, persistent A. fib on Eliquis and diastolic CHF admitted to ICU 08/03/2023 cardiac arrest.  Found to have new systolic CHF with EF of 20 to 25%, global hypokinesis.  Blood culture grew MSSA for which she was started on vancomycin and transition to Ancef on 08/06/2019.  Patient had echocardiogram on 08/21/2019 that showed EF of 55 to 60%, moderate LAE and RVSP to 39.  Hospital course complicated by A. fib with RVR, acute metabolic encephalopathy/agitation and AKI.  She is a still with cortrack for feeding.  Subjective: No major events overnight of this morning.  No complaint this morning.  Denies chest pain, dyspnea, abdominal pain or new focal neuro symptoms.  Sitting on bed eating breakfast with assistance.   Objective: Vitals:   09/06/19 2023 09/07/19 0011 09/07/19 0402 09/07/19 1123  BP:  (!) 123/58 (!) 123/52 119/76  Pulse:   85 (!) 106  Resp:   17   Temp: 97.8 F (36.6 C) 98.4 F (36.9 C) 98.2 F (36.8 C)   TempSrc: Oral Oral    SpO2:   100%   Weight:   73.1 kg   Height:        Intake/Output Summary (Last 24 hours) at 09/07/2019 1219 Last data filed at 09/06/2019 2200 Gross per 24 hour  Intake 10 ml  Output 1500 ml  Net -1490 ml   Filed Weights   09/06/19 1420 09/06/19 1800 09/07/19 0402  Weight: 74.5 kg 71.6 kg 73.1 kg    Examination:  GENERAL: No acute distress.  HEENT: MMM.  Vision and hearing grossly intact.  Cortrack in place. NECK: Supple.  No apparent JVD.  RESP:  No IWOB.  Fair air movement bilaterally. CVS: IR.  HR 100. Heart sounds normal.  ABD/GI/GU: Bowel sounds present. Soft. Non tender.  MSK/EXT: No apparent edema.  Bilateral lower extremity weakness. SKIN: no apparent skin lesion or wound  NEURO: Awake, alert and oriented appropriately.  Motor 3/5 in RUE and 2+/5 elsewhere.  LUE edema. PSYCH: Calm. Normal affect.  Assessment & Plan: Acute respiratory failure with hypoxia "multifactorial including cardiac arrest, pulmonary edema, bilateral pleural effusion and anxiety -treat treatable causes. -Wean to room air.  MSSA bacteremia/possible AV vegetation/endocarditis: Some concern about AV vegetation versus fibrous tissue.  -Plan to continue Ancef until 09/18/2019 per ID-total of 6 weeks  Cardiac arrest: Admitted to ICU with hypothermia protocol.  Intubated and extubated. -Cardiology managing.  A. fib with RVR: Stable on amiodarone and warfarin -Continue p.o. amiodarone and warfarin-cardiology managing  Acute systolic CHF: Initial TTE on 10/23 with EF of 20 to 25%.  Repeat TTE on 11/9 with EF of 55 to 60%.  Currently euvolemic except for trace edema. -Fluid management with dialysis--1.5 L with HD 11/25.  Acute metabolic encephalopathy: Resolved.  She is oriented x4 with good insight. -Frequent reorientation and delirium precautions.  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  Patient with significant upper and lower extremity weakness left greater than right mainly in upper extremities. -Continue warfarin per pharmacy  Acute blood loss anemia: Reportedly bled from HD catheter on 11/15.  -Hgb 11.6 (admit and baseline)>> 6.9 (11/10)>1u> 8.2>> 5.9 (11/16)> 8.8>> 7.6>1u -On Aranesp and iron per nephro -Monitor H&H  AKI on CKD-4/BMD: Likely  hemodynamically mediated in the setting of cardiac issues and bacteremia -CRRT 10/30-11/1>>IHD  -Continue monitoring  Hypotension: Normotensive. -Continue midodrine.  Dysphagia -Dysphagia 1 diet per SLP. -We may be able to discontinue cortrack if good p.o. intake.  Leukocytosis/bandemia: Afebrile.  Due to MSSA bacteremia?  Improving. -Continue monitoring  Oral thrush  -Continue nystatin  Vitamin D deficiency -On weekly vitamin D 50,000 units    Moderate malnutrition: Nutrition Problem: Moderate Malnutrition Etiology: acute illness(acute respiratory failure, AKI required CRRT, endocarditis)  Signs/Symptoms: mild fat depletion, mild muscle depletion, moderate muscle depletion  Interventions: Tube feeding   DVT prophylaxis: Subcu heparin Code Status: Full code Family Communication: Patient and/or RN. Available if any question. Disposition Plan: Remains inpatient Consultants: Nephrology, IR, neurology, palliative care, cardiology  Procedures:  HD cath placement CRRT IHD  Microbiology summarized: 10/22-COVID-19 negative 10/22-MRSA PCR negative 10/22-respiratory viral panel negative 10/22-urine culture with E. Coli 10/22-blood culture with MSSA in 1/2 bottles 10/26, 10/29, 11/16 and 11/20-blood cultures negative  Sch Meds:  Scheduled Meds: .  stroke: mapping our early stages of recovery book   Does not apply Once  . sodium chloride   Intravenous Once  . amiodarone  200 mg Oral Daily  . ARIPiprazole  10 mg Oral BID  . atorvastatin  20 mg Oral Daily  . chlorhexidine  15 mL Mouth Rinse BID  . Chlorhexidine Gluconate Cloth  6 each Topical Q0600  . cholecalciferol  1,000 Units Oral Daily  . [START ON 09/14/2019] darbepoetin (ARANESP) injection - DIALYSIS  100 mcg Intravenous Q Thu-HD  . darbepoetin (ARANESP) injection - NON-DIALYSIS  100 mcg Subcutaneous Q Thu-1800  . DULoxetine  30 mg Oral Daily  . feeding supplement  1 Container Oral TID BM  . feeding supplement (NEPRO CARB STEADY)  237 mL Oral BID BM  . feeding supplement (PRO-STAT SUGAR FREE 64)  30 mL Oral BID  . ferrous sulfate  325 mg Oral Daily  . insulin aspart  0-9 Units Subcutaneous Q4H  . mouth rinse  15 mL Mouth Rinse BID  . midodrine  10 mg Oral TID WC  . multivitamin  1 tablet Oral QHS  . nystatin  5 mL Oral QID  . pantoprazole  40 mg Oral Daily  . sodium chloride flush   10-40 mL Intracatheter Q12H  . thiamine  100 mg Oral Daily  . Thrombi-Pad  1 each Topical Once  . valproic acid  250 mg Oral Q6H  . warfarin  2.5 mg Oral ONCE-1800  . Warfarin - Pharmacist Dosing Inpatient   Does not apply q1800   Continuous Infusions: . sodium chloride 10 mL (08/20/19 2025)  . sodium chloride    . sodium chloride    . [START ON 09/09/2019]  ceFAZolin (ANCEF) IV    . feeding supplement (OSMOLITE 1.5 CAL) Stopped (09/06/19 0905)   PRN Meds:.sodium chloride, sodium chloride, acetaminophen, alteplase, heparin, hydrALAZINE, HYDROmorphone (DILAUDID) injection, levalbuterol, lidocaine (PF), lidocaine-prilocaine, pentafluoroprop-tetrafluoroeth, sodium chloride flush  Antimicrobials: Anti-infectives (From admission, onward)   Start     Dose/Rate Route Frequency Ordered Stop   09/09/19 1800  ceFAZolin (ANCEF) IVPB 2g/100 mL premix     2 g 200 mL/hr over 30 Minutes Intravenous Every T-Th-Sa (1800) 09/05/19 1024     09/04/19 1800  ceFAZolin (ANCEF) IVPB 2g/100 mL premix     2 g 200 mL/hr over 30 Minutes Intravenous Every M-W-F (1800) 09/04/19 1335 09/06/19 1736   08/31/19 1800  ceFAZolin (ANCEF) IVPB 2g/100 mL premix  Status:  Discontinued  2 g 200 mL/hr over 30 Minutes Intravenous Every T-Th-Sa (1800) 08/31/19 1401 09/04/19 1335   08/30/19 0900  ceFAZolin (ANCEF) IVPB 2g/100 mL premix     2 g 200 mL/hr over 30 Minutes Intravenous  Once 08/30/19 0854 08/30/19 1134   08/24/19 1200  ceFAZolin (ANCEF) IVPB 2g/100 mL premix  Status:  Discontinued     2 g 200 mL/hr over 30 Minutes Intravenous Every T-Th-Sa (Hemodialysis) 08/23/19 0807 08/31/19 1401   08/20/19 2200  ceFAZolin (ANCEF) IVPB 1 g/50 mL premix  Status:  Discontinued     1 g 100 mL/hr over 30 Minutes Intravenous Daily at bedtime 08/20/19 1507 08/23/19 0807   08/15/19 2200  ceFAZolin (ANCEF) IVPB 2g/100 mL premix  Status:  Discontinued     2 g 200 mL/hr over 30 Minutes Intravenous Daily at bedtime 08/15/19 1133  08/20/19 1507   08/11/19 1800  ceFAZolin (ANCEF) IVPB 2g/100 mL premix  Status:  Discontinued     2 g 200 mL/hr over 30 Minutes Intravenous Every 12 hours 08/11/19 1110 08/15/19 1133   08/10/19 1600  ceFAZolin (ANCEF) IVPB 1 g/50 mL premix  Status:  Discontinued     1 g 100 mL/hr over 30 Minutes Intravenous Every 12 hours 08/10/19 1530 08/11/19 1110   08/06/19 1800  ceFAZolin (ANCEF) IVPB 1 g/50 mL premix  Status:  Discontinued     1 g 100 mL/hr over 30 Minutes Intravenous Every 24 hours 08/06/19 1025 08/10/19 1530   08/05/19 1530  vancomycin (VANCOCIN) 1,750 mg in sodium chloride 0.9 % 500 mL IVPB     1,750 mg 250 mL/hr over 120 Minutes Intravenous  Once 08/05/19 1444 08/05/19 1736   08/05/19 1443  vancomycin variable dose per unstable renal function (pharmacist dosing)  Status:  Discontinued      Does not apply See admin instructions 08/05/19 1444 08/06/19 1025   08/03/19 1300  cefTRIAXone (ROCEPHIN) 2 g in sodium chloride 0.9 % 100 mL IVPB  Status:  Discontinued     2 g 200 mL/hr over 30 Minutes Intravenous Every 24 hours 08/03/19 1251 08/06/19 1025       I have personally reviewed the following labs and images: CBC: Recent Labs  Lab 09/03/19 0325 09/04/19 0608 09/05/19 0357 09/06/19 0352 09/07/19 0315  WBC 19.8* 16.0* 17.3* 18.1* 16.3*  HGB 7.8* 7.9* 7.5* 7.8* 7.6*  HCT 25.7* 25.9* 24.8* 26.1* 24.9*  MCV 92.8 92.8 94.3 95.3 94.3  PLT 357 331 346 363 331   BMP &GFR Recent Labs  Lab 09/02/19 1304  09/04/19 1200 09/05/19 0357 09/05/19 1223 09/06/19 0352 09/07/19 0315  NA 136  --  136  --  139 137 136  K 5.4*  --  5.6*  --  4.9 5.1 3.9  CL 97*  --  98  --  98 97* 96*  CO2 27  --  29  --  30 28 30   GLUCOSE 137*  --  96  --  106* 109* 109*  BUN 48*  --  47*  --  27* 41* 18  CREATININE 3.75*  --  3.81*  --  2.69* 3.33* 1.72*  CALCIUM 9.3  --  9.2  --  9.6 9.5 8.5*  MG  --   --   --   --   --   --  1.9  PHOS 4.0   < > 4.6 3.7 4.4 5.3* 3.4   < > = values in this  interval not displayed.   Estimated Creatinine Clearance:  30.2 mL/min (A) (by C-G formula based on SCr of 1.72 mg/dL (H)). Liver & Pancreas: Recent Labs  Lab 09/02/19 1304 09/04/19 1200 09/05/19 1223 09/06/19 0352 09/07/19 0315  ALBUMIN 1.6* 1.6* 1.8* 1.7* 1.7*   No results for input(s): LIPASE, AMYLASE in the last 168 hours. No results for input(s): AMMONIA in the last 168 hours. Diabetic: No results for input(s): HGBA1C in the last 72 hours. Recent Labs  Lab 09/06/19 2008 09/07/19 0010 09/07/19 0417 09/07/19 0815 09/07/19 1132  GLUCAP 104* 111* 111* 117* 157*   Cardiac Enzymes: No results for input(s): CKTOTAL, CKMB, CKMBINDEX, TROPONINI in the last 168 hours. No results for input(s): PROBNP in the last 8760 hours. Coagulation Profile: Recent Labs  Lab 09/03/19 0325 09/04/19 0608 09/05/19 0357 09/06/19 0352 09/07/19 0315  INR 2.3* 2.0* 1.8* 1.8* 2.0*   Thyroid Function Tests: No results for input(s): TSH, T4TOTAL, FREET4, T3FREE, THYROIDAB in the last 72 hours. Lipid Profile: No results for input(s): CHOL, HDL, LDLCALC, TRIG, CHOLHDL, LDLDIRECT in the last 72 hours. Anemia Panel: No results for input(s): VITAMINB12, FOLATE, FERRITIN, TIBC, IRON, RETICCTPCT in the last 72 hours. Urine analysis:    Component Value Date/Time   COLORURINE BROWN (A) 08/05/2019 2018   APPEARANCEUR TURBID (A) 08/05/2019 2018   LABSPEC RESULTS UNAVAILABLE DUE TO INTERFERING SUBSTANCE 08/05/2019 2018   PHURINE RESULTS UNAVAILABLE DUE TO INTERFERING SUBSTANCE 08/05/2019 2018   GLUCOSEU RESULTS UNAVAILABLE DUE TO INTERFERING SUBSTANCE (A) 08/05/2019 2018   HGBUR RESULTS UNAVAILABLE DUE TO INTERFERING SUBSTANCE (A) 08/05/2019 2018   BILIRUBINUR RESULTS UNAVAILABLE DUE TO INTERFERING SUBSTANCE (A) 08/05/2019 2018   KETONESUR RESULTS UNAVAILABLE DUE TO INTERFERING SUBSTANCE (A) 08/05/2019 2018   PROTEINUR RESULTS UNAVAILABLE DUE TO INTERFERING SUBSTANCE (A) 08/05/2019 2018   UROBILINOGEN  0.2 05/07/2009 1520   NITRITE RESULTS UNAVAILABLE DUE TO INTERFERING SUBSTANCE (A) 08/05/2019 2018   LEUKOCYTESUR RESULTS UNAVAILABLE DUE TO INTERFERING SUBSTANCE (A) 08/05/2019 2018   Sepsis Labs: Invalid input(s): PROCALCITONIN, [place]  Microbiology: Recent Results (from the past 240 hour(s))  Culture, blood (routine x 2)     Status: None   Collection Time: 09/01/19  4:26 PM   Specimen: BLOOD LEFT HAND  Result Value Ref Range Status   Specimen Description BLOOD LEFT HAND  Final   Special Requests   Final    BOTTLES DRAWN AEROBIC ONLY Blood Culture adequate volume   Culture   Final    NO GROWTH 5 DAYS Performed at [facility], [address], [place], [place] [address]    Report Status 09/06/2019 FINAL  Final  Culture, blood (routine x 2)     Status: None   Collection Time: 09/01/19  4:26 PM   Specimen: BLOOD LEFT HAND  Result Value Ref Range Status   Specimen Description BLOOD LEFT HAND  Final   Special Requests   Final    BOTTLES DRAWN AEROBIC ONLY Blood Culture adequate volume   Culture   Final    NO GROWTH 5 DAYS Performed at [facility], [address], [place], [place] [address]    Report Status 09/06/2019 FINAL  Final    Radiology Studies: No results found.  45 minutes with more than 50% spent in reviewing records, counseling patient/family and coordinating care.  Annete Ayuso T. Ballard  If 7PM-7AM, please contact night-coverage [email or web address] Password [facility] 09/07/2019, 12:19 PM

## 2019-09-08 DIAGNOSIS — I5043 Acute on chronic combined systolic (congestive) and diastolic (congestive) heart failure: Secondary | ICD-10-CM | POA: Diagnosis not present

## 2019-09-08 DIAGNOSIS — I469 Cardiac arrest, cause unspecified: Secondary | ICD-10-CM | POA: Diagnosis not present

## 2019-09-08 DIAGNOSIS — R7881 Bacteremia: Secondary | ICD-10-CM | POA: Diagnosis not present

## 2019-09-08 DIAGNOSIS — I330 Acute and subacute infective endocarditis: Secondary | ICD-10-CM | POA: Diagnosis not present

## 2019-09-08 LAB — RENAL FUNCTION PANEL
Albumin: 1.8 g/dL — ABNORMAL LOW (ref 3.5–5.0)
Anion gap: 14 (ref 5–15)
BUN: 46 mg/dL — ABNORMAL HIGH (ref 8–23)
CO2: 27 mmol/L (ref 22–32)
Calcium: 9.1 mg/dL (ref 8.9–10.3)
Chloride: 96 mmol/L — ABNORMAL LOW (ref 98–111)
Creatinine, Ser: 3.01 mg/dL — ABNORMAL HIGH (ref 0.44–1.00)
GFR calc Af Amer: 17 mL/min — ABNORMAL LOW (ref >60)
GFR calc non Af Amer: 15 mL/min — ABNORMAL LOW (ref >60)
Glucose, Bld: 110 mg/dL — ABNORMAL HIGH (ref 70–99)
Phosphorus: 4.5 mg/dL (ref 2.5–4.6)
Potassium: 4.7 mmol/L (ref 3.5–5.1)
Sodium: 137 mmol/L (ref 135–145)

## 2019-09-08 LAB — GLUCOSE, CAPILLARY
Glucose-Capillary: 102 mg/dL — ABNORMAL HIGH (ref 70–99)
Glucose-Capillary: 107 mg/dL — ABNORMAL HIGH (ref 70–99)
Glucose-Capillary: 110 mg/dL — ABNORMAL HIGH (ref 70–99)
Glucose-Capillary: 120 mg/dL — ABNORMAL HIGH (ref 70–99)
Glucose-Capillary: 161 mg/dL — ABNORMAL HIGH (ref 70–99)
Glucose-Capillary: 97 mg/dL (ref 70–99)

## 2019-09-08 LAB — CBC
HCT: 28.5 % — ABNORMAL LOW (ref 36.0–46.0)
Hemoglobin: 8.8 g/dL — ABNORMAL LOW (ref 12.0–15.0)
MCH: 28.2 pg (ref 26.0–34.0)
MCHC: 30.9 g/dL (ref 30.0–36.0)
MCV: 91.3 fL (ref 80.0–100.0)
Platelets: 299 10*3/uL (ref 150–400)
RBC: 3.12 MIL/uL — ABNORMAL LOW (ref 3.87–5.11)
RDW: 16.5 % — ABNORMAL HIGH (ref 11.5–15.5)
WBC: 16.3 10*3/uL — ABNORMAL HIGH (ref 4.0–10.5)
nRBC: 0 % (ref 0.0–0.2)

## 2019-09-08 LAB — BPAM RBC
Blood Product Expiration Date: 202012272359
ISSUE DATE / TIME: 202011261431
Unit Type and Rh: 5100

## 2019-09-08 LAB — MAGNESIUM: Magnesium: 2.1 mg/dL (ref 1.7–2.4)

## 2019-09-08 LAB — TYPE AND SCREEN
ABO/RH(D): O POS
Antibody Screen: NEGATIVE
Unit division: 0

## 2019-09-08 LAB — PROTIME-INR
INR: 2.4 — ABNORMAL HIGH (ref 0.8–1.2)
Prothrombin Time: 26 seconds — ABNORMAL HIGH (ref 11.4–15.2)

## 2019-09-08 MED ORDER — CHLORHEXIDINE GLUCONATE CLOTH 2 % EX PADS
6.0000 | MEDICATED_PAD | Freq: Every day | CUTANEOUS | Status: DC
  Administered 2019-09-10 – 2019-09-24 (×12): 6 via TOPICAL

## 2019-09-08 MED ORDER — WARFARIN SODIUM 2 MG PO TABS
2.0000 mg | ORAL_TABLET | Freq: Once | ORAL | Status: AC
  Administered 2019-09-08: 2 mg via ORAL
  Filled 2019-09-08: qty 1

## 2019-09-08 MED ORDER — MIDODRINE HCL 5 MG PO TABS
5.0000 mg | ORAL_TABLET | Freq: Three times a day (TID) | ORAL | Status: DC
  Administered 2019-09-08 – 2019-09-10 (×8): 5 mg via ORAL
  Filled 2019-09-08 (×7): qty 1

## 2019-09-08 MED ORDER — COUMADIN BOOK
Freq: Once | Status: DC
  Filled 2019-09-08: qty 1

## 2019-09-08 NOTE — Progress Notes (Addendum)
Patient's tube feeding has been stopped for tonight due to clogged tubing, I was able to flush and restarted line but error has been given by pump system.  I have notified [facility].

## 2019-09-08 NOTE — Progress Notes (Signed)
PROGRESS NOTE  Jaclyn Day [id number] DOB: Jan 16, 1946   PCP: Cyndi Bender, PA-C  Patient is from: Home  DOA: 08/03/2019 LOS: 47  Brief Narrative / Interim history: 73 year old female with history of congenital unilateral kidney, CKD-4, HTN, persistent A. fib on Eliquis and diastolic CHF admitted to ICU 08/03/2023 cardiac arrest.  Found to have new systolic CHF with EF of 20 to 25%, global hypokinesis.  Blood culture grew MSSA for which she was started on vancomycin and transition to Ancef on 08/06/2019.  Patient had echocardiogram on 08/21/2019 that showed EF of 55 to 60%, moderate LAE and RVSP to 39.  Hospital course complicated by A. fib with RVR, acute metabolic encephalopathy/agitation and AKI.  She is a still with cortrack for feeding.  Subjective: No major events overnight of this morning.  No complaints.  She denies chest pain, dyspnea, palpitation, GI or UTI symptoms.  She is oriented x4 except the year.  However, she had limited insight into her condition.  She tells me she is here because she had a stroke.   Objective: Vitals:   09/08/19 0500 09/08/19 0754 09/08/19 1058 09/08/19 1136  BP:  136/89  138/77  Pulse:  80  79  Resp:  19  20  Temp:  (!) 97.5 F (36.4 C)  (!) 97.4 F (36.3 C)  TempSrc:  Oral  Oral  SpO2:  99% 100% 100%  Weight: 73.2 kg     Height:        Intake/Output Summary (Last 24 hours) at 09/08/2019 1221 Last data filed at 09/08/2019 0850 Gross per 24 hour  Intake 1016 ml  Output 2 ml  Net 1014 ml   Filed Weights   09/06/19 1800 09/07/19 0402 09/08/19 0500  Weight: 71.6 kg 73.1 kg 73.2 kg    Examination:  GENERAL: No acute distress.   HEENT: MMM.  Vision and hearing grossly intact. Cortrak in place.  Dilated pupil but round and reactive to light. NECK: Supple.  No apparent JVD.  RESP:  No IWOB.  Fair air movement bilaterally. CVS:  RRR. Heart sounds normal.  ABD/GI/GU: Bowel sounds present. Soft. Non tender.  MSK/EXT: Extremity  weakness as below.  No apparent deformity.  LUE edema SKIN: no apparent skin lesion or wound NEURO: Awake, alert and oriented appropriately.  Motor 3/5 in RUE and 2/5 elsewhere.  PSYCH: Calm. Normal affect.   Assessment & Plan: Acute respiratory failure with hypoxia "multifactorial including cardiac arrest, pulmonary edema, bilateral pleural effusion and anxiety.  Resolved.  She is now on room air. -treat treatable causes.  MSSA bacteremia/possible AV vegetation/endocarditis/persistent leukocytosis: concern about AV vegetation versus fibrous tissue.  Still have leukocytosis and mild fever to 99.9 -Plan to continue Ancef until 09/18/2019 per ID-total of 6 weeks -Check CBC with differential in the morning.  Cardiac arrest: Admitted to ICU with hypothermia protocol.  Intubated and extubated. -Cardiology managing.  A. fib with RVR: Stable on amiodarone and warfarin -Continue p.o. amiodarone and warfarin-cardiology managing  Acute systolic CHF: Initial TTE on 10/23 with EF of 20 to 25%.  Repeat TTE on 11/9 with EF of 55 to 60%.  Currently euvolemic except for trace edema. -Fluid management with dialysis--1.5 L with HD 11/25.  Acute metabolic encephalopathy: Resolved.  She is oriented x4 except year but limited insight -Frequent reorientation and delirium precautions.  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  Patient with significant upper and lower extremity weakness left greater than right mainly  in upper extremities. -Continue warfarin per pharmacy  Acute blood loss anemia: Reportedly bled from HD catheter on 11/15.  -Hgb 11.6 (admit and baseline)>> 6.9 (11/10)>1u> 8.2>> 5.9 (11/16)> 8.8>> 7.6>1u> 8.8 -On Aranesp and iron per nephro -Monitor H&H  AKI on CKD-4/BMD: Likely hemodynamically mediated in the setting of cardiac issues and bacteremia -CRRT 10/30-11/1>>IHD  -Continue monitoring  Hypotension: Normotensive.  -Continue midodrine.  Dysphagia -Dysphagia 1 diet per SLP. -We may be able to discontinue cortrack if good p.o. intake.  Oral thrush -Continue nystatin  Vitamin D deficiency -On weekly vitamin D 50,000 units    Moderate malnutrition: Nutrition Problem: Moderate Malnutrition Etiology: acute illness(acute respiratory failure, AKI required CRRT, endocarditis)  Signs/Symptoms: mild fat depletion, mild muscle depletion, moderate muscle depletion  Interventions: Tube feeding   DVT prophylaxis: Subcu heparin Code Status: Full code Family Communication: Patient and/or RN. Available if any question. Disposition Plan: Remains inpatient Consultants: Nephrology, IR, neurology, palliative care, cardiology  Procedures:  HD cath placement CRRT IHD  Microbiology summarized: 10/22-COVID-19 negative 10/22-MRSA PCR negative 10/22-respiratory viral panel negative 10/22-urine culture with E. Coli 10/22-blood culture with MSSA in 1/2 bottles 10/26, 10/29, 11/16 and 11/20-blood cultures negative  Sch Meds:  Scheduled Meds: .  stroke: mapping our early stages of recovery book   Does not apply Once  . sodium chloride   Intravenous Once  . amiodarone  200 mg Oral Daily  . ARIPiprazole  10 mg Oral BID  . atorvastatin  20 mg Oral Daily  . chlorhexidine  15 mL Mouth Rinse BID  . Chlorhexidine Gluconate Cloth  6 each Topical Q0600  . [START ON 09/14/2019] darbepoetin (ARANESP) injection - DIALYSIS  100 mcg Intravenous Q Thu-HD  . feeding supplement  1 Container Oral TID BM  . feeding supplement (NEPRO CARB STEADY)  237 mL Oral BID BM  . feeding supplement (PRO-STAT SUGAR FREE 64)  30 mL Oral BID  . ferrous sulfate  325 mg Oral Daily  . insulin aspart  0-9 Units Subcutaneous Q4H  . mouth rinse  15 mL Mouth Rinse BID  . midodrine  5 mg Oral TID WC  . multivitamin  1 tablet Oral QHS  . nystatin  5 mL Oral QID  . pantoprazole  40 mg Oral Daily  . sodium chloride flush  10-40 mL  Intracatheter Q12H  . thiamine  100 mg Oral Daily  . Thrombi-Pad  1 each Topical Once  . valproic acid  250 mg Oral Q6H  . warfarin  2 mg Oral ONCE-1800  . Warfarin - Pharmacist Dosing Inpatient   Does not apply q1800   Continuous Infusions: . sodium chloride 10 mL (08/20/19 2025)  . sodium chloride    . sodium chloride    . [START ON 09/09/2019]  ceFAZolin (ANCEF) IV    . feeding supplement (OSMOLITE 1.5 CAL) 1,000 mL (09/08/19 0252)   PRN Meds:.sodium chloride, sodium chloride, acetaminophen, alteplase, heparin, hydrALAZINE, HYDROmorphone (DILAUDID) injection, levalbuterol, lidocaine (PF), lidocaine-prilocaine, pentafluoroprop-tetrafluoroeth, sodium chloride flush  Antimicrobials: Anti-infectives (From admission, onward)   Start     Dose/Rate Route Frequency Ordered Stop   09/09/19 1800  ceFAZolin (ANCEF) IVPB 2g/100 mL premix     2 g 200 mL/hr over 30 Minutes Intravenous Every T-Th-Sa (1800) 09/05/19 1024     09/04/19 1800  ceFAZolin (ANCEF) IVPB 2g/100 mL premix     2 g 200 mL/hr over 30 Minutes Intravenous Every M-W-F (1800) 09/04/19 1335 09/06/19 1736   08/31/19 1800  ceFAZolin (ANCEF) IVPB 2g/100 mL  premix  Status:  Discontinued     2 g 200 mL/hr over 30 Minutes Intravenous Every T-Th-Sa (1800) 08/31/19 1401 09/04/19 1335   08/30/19 0900  ceFAZolin (ANCEF) IVPB 2g/100 mL premix     2 g 200 mL/hr over 30 Minutes Intravenous  Once 08/30/19 0854 08/30/19 1134   08/24/19 1200  ceFAZolin (ANCEF) IVPB 2g/100 mL premix  Status:  Discontinued     2 g 200 mL/hr over 30 Minutes Intravenous Every T-Th-Sa (Hemodialysis) 08/23/19 0807 08/31/19 1401   08/20/19 2200  ceFAZolin (ANCEF) IVPB 1 g/50 mL premix  Status:  Discontinued     1 g 100 mL/hr over 30 Minutes Intravenous Daily at bedtime 08/20/19 1507 08/23/19 0807   08/15/19 2200  ceFAZolin (ANCEF) IVPB 2g/100 mL premix  Status:  Discontinued     2 g 200 mL/hr over 30 Minutes Intravenous Daily at bedtime 08/15/19 1133 08/20/19 1507    08/11/19 1800  ceFAZolin (ANCEF) IVPB 2g/100 mL premix  Status:  Discontinued     2 g 200 mL/hr over 30 Minutes Intravenous Every 12 hours 08/11/19 1110 08/15/19 1133   08/10/19 1600  ceFAZolin (ANCEF) IVPB 1 g/50 mL premix  Status:  Discontinued     1 g 100 mL/hr over 30 Minutes Intravenous Every 12 hours 08/10/19 1530 08/11/19 1110   08/06/19 1800  ceFAZolin (ANCEF) IVPB 1 g/50 mL premix  Status:  Discontinued     1 g 100 mL/hr over 30 Minutes Intravenous Every 24 hours 08/06/19 1025 08/10/19 1530   08/05/19 1530  vancomycin (VANCOCIN) 1,750 mg in sodium chloride 0.9 % 500 mL IVPB     1,750 mg 250 mL/hr over 120 Minutes Intravenous  Once 08/05/19 1444 08/05/19 1736   08/05/19 1443  vancomycin variable dose per unstable renal function (pharmacist dosing)  Status:  Discontinued      Does not apply See admin instructions 08/05/19 1444 08/06/19 1025   08/03/19 1300  cefTRIAXone (ROCEPHIN) 2 g in sodium chloride 0.9 % 100 mL IVPB  Status:  Discontinued     2 g 200 mL/hr over 30 Minutes Intravenous Every 24 hours 08/03/19 1251 08/06/19 1025       I have personally reviewed the following labs and images: CBC: Recent Labs  Lab 09/04/19 0608 09/05/19 0357 09/06/19 0352 09/07/19 0315 09/08/19 0315  WBC 16.0* 17.3* 18.1* 16.3* 16.3*  HGB 7.9* 7.5* 7.8* 7.6* 8.8*  HCT 25.9* 24.8* 26.1* 24.9* 28.5*  MCV 92.8 94.3 95.3 94.3 91.3  PLT 331 346 363 331 299   BMP &GFR Recent Labs  Lab 09/04/19 1200 09/05/19 0357 09/05/19 1223 09/06/19 0352 09/07/19 0315 09/08/19 0315  NA 136  --  139 137 136 137  K 5.6*  --  4.9 5.1 3.9 4.7  CL 98  --  98 97* 96* 96*  CO2 29  --  30 28 30 27   GLUCOSE 96  --  106* 109* 109* 110*  BUN 47*  --  27* 41* 18 46*  CREATININE 3.81*  --  2.69* 3.33* 1.72* 3.01*  CALCIUM 9.2  --  9.6 9.5 8.5* 9.1  MG  --   --   --   --  1.9 2.1  PHOS 4.6 3.7 4.4 5.3* 3.4 4.5   Estimated Creatinine Clearance: 17.3 mL/min (A) (by C-G formula based on SCr of 3.01 mg/dL  (H)). Liver & Pancreas: Recent Labs  Lab 09/04/19 1200 09/05/19 1223 09/06/19 0352 09/07/19 0315 09/08/19 0315  ALBUMIN 1.6* 1.8* 1.7* 1.7*  1.8*   No results for input(s): LIPASE, AMYLASE in the last 168 hours. No results for input(s): AMMONIA in the last 168 hours. Diabetic: No results for input(s): HGBA1C in the last 72 hours. Recent Labs  Lab 09/07/19 2029 09/08/19 0028 09/08/19 0621 09/08/19 0752 09/08/19 1134  GLUCAP 99 107* 110* 102* 120*   Cardiac Enzymes: No results for input(s): CKTOTAL, CKMB, CKMBINDEX, TROPONINI in the last 168 hours. No results for input(s): PROBNP in the last 8760 hours. Coagulation Profile: Recent Labs  Lab 09/04/19 0608 09/05/19 0357 09/06/19 0352 09/07/19 0315 09/08/19 0315  INR 2.0* 1.8* 1.8* 2.0* 2.4*   Thyroid Function Tests: No results for input(s): TSH, T4TOTAL, FREET4, T3FREE, THYROIDAB in the last 72 hours. Lipid Profile: No results for input(s): CHOL, HDL, LDLCALC, TRIG, CHOLHDL, LDLDIRECT in the last 72 hours. Anemia Panel: No results for input(s): VITAMINB12, FOLATE, FERRITIN, TIBC, IRON, RETICCTPCT in the last 72 hours. Urine analysis:    Component Value Date/Time   COLORURINE BROWN (A) 08/05/2019 2018   APPEARANCEUR TURBID (A) 08/05/2019 2018   LABSPEC RESULTS UNAVAILABLE DUE TO INTERFERING SUBSTANCE 08/05/2019 2018   PHURINE RESULTS UNAVAILABLE DUE TO INTERFERING SUBSTANCE 08/05/2019 2018   GLUCOSEU RESULTS UNAVAILABLE DUE TO INTERFERING SUBSTANCE (A) 08/05/2019 2018   HGBUR RESULTS UNAVAILABLE DUE TO INTERFERING SUBSTANCE (A) 08/05/2019 2018   BILIRUBINUR RESULTS UNAVAILABLE DUE TO INTERFERING SUBSTANCE (A) 08/05/2019 2018   KETONESUR RESULTS UNAVAILABLE DUE TO INTERFERING SUBSTANCE (A) 08/05/2019 2018   PROTEINUR RESULTS UNAVAILABLE DUE TO INTERFERING SUBSTANCE (A) 08/05/2019 2018   UROBILINOGEN 0.2 05/07/2009 1520   NITRITE RESULTS UNAVAILABLE DUE TO INTERFERING SUBSTANCE (A) 08/05/2019 2018   LEUKOCYTESUR  RESULTS UNAVAILABLE DUE TO INTERFERING SUBSTANCE (A) 08/05/2019 2018   Sepsis Labs: Invalid input(s): PROCALCITONIN, [place]  Microbiology: Recent Results (from the past 240 hour(s))  Culture, blood (routine x 2)     Status: None   Collection Time: 09/01/19  4:26 PM   Specimen: BLOOD LEFT HAND  Result Value Ref Range Status   Specimen Description BLOOD LEFT HAND  Final   Special Requests   Final    BOTTLES DRAWN AEROBIC ONLY Blood Culture adequate volume   Culture   Final    NO GROWTH 5 DAYS Performed at [facility], [address], [place], [place] [address]    Report Status 09/06/2019 FINAL  Final  Culture, blood (routine x 2)     Status: None   Collection Time: 09/01/19  4:26 PM   Specimen: BLOOD LEFT HAND  Result Value Ref Range Status   Specimen Description BLOOD LEFT HAND  Final   Special Requests   Final    BOTTLES DRAWN AEROBIC ONLY Blood Culture adequate volume   Culture   Final    NO GROWTH 5 DAYS Performed at [facility], [place] [address], [place], [place] [address]    Report Status 09/06/2019 FINAL  Final    Radiology Studies: No results found.  Madelein Mahadeo T. Gurabo  If 7PM-7AM, please contact night-coverage [email or web address] Password [facility] 09/08/2019, 12:21 PM

## 2019-09-08 NOTE — TOC Progression Note (Addendum)
Transition of Care [facility]) - Progression Note    Patient Details  Name: Jaclyn Day MRN: [id number] Date of Birth: May 16, 1946  Transition of Care [facility]) CM/SW [place], [place] Phone Number: 09/08/2019, 2:10 PM  Clinical Narrative:    Further clinicals submitted. CSW called [facility], at current time clinicals received, no determination for SNF available. Documents submitted for PASRR approval as pt has gone to level 2.   TOC team continues to follow.  Barriers include coretrak, Biochemist, clinical, and PASRR.    Expected Discharge Plan: Long Term Acute Care (LTAC) Barriers to Discharge: Continued Medical Work up; [employer] pending  Expected Discharge Plan and Services Expected Discharge Plan: [place] (LTAC) In-house Referral: NA Discharge Planning Services: CM Consult Post Acute Care Choice: [place] Living arrangements for the past 2 months: Single Family Home    Social Determinants of Health (SDOH) Interventions    Readmission Risk Interventions Readmission Risk Prevention Plan 08/08/2019  Transportation Screening Complete  PCP or Specialist Appt within 3-5 Days Complete  HRI or Home Care Consult Complete  Medication Review (RN Care Manager) Complete  Some recent data might be hidden

## 2019-09-08 NOTE — Progress Notes (Signed)
  Speech Language Pathology Treatment: Dysphagia;Cognitive-Linquistic  Patient Details Name: SHANAZ HAUGHN MRN: [id number] DOB: 1946/09/26 Today's Date: 09/08/2019 Time: [id number] SLP Time Calculation (min) (ACUTE ONLY): 13 min  Assessment / Plan / Recommendation Clinical Impression  Treatment focused primarily on communication goals, although SLP also provided advanced trials of soft solids and thin liquids via straw. Pt has increased labial seal today and can seal her lips around a straw and suck with no overt s/s of aspiration noted. This seems better coordinated than trying to get cup sips when she is dependent on others for feeding. Even a small piece of graham cracker requires prolonged effort in mastication and oral clearance, and therefore pt did not want to have much of it. Would continue with Dys 1 diet but allowing straws with thin liquids.   Cognitively, pt was oriented to person, place, and situation (not tested for time). She followed one-step commands well without cueing during functional tasks. She was 100% accurate with confrontational naming but could only name 1-2 words per category during divergent naming task. With Mod cues, she could get up to five. SLP will continue to follow for cognitive-linguistic and swallowing tx.    HPI HPI: Pt is a 73 yo F admitted for witnessed out of hospital cardiac arrest, unclear etiology, with field ROSC, resultant respiratory failure and encephalopathy s/p TTM; ETT 10/22-11/1. CRRT initiated 10/30. PMH: unilateral congenital absence of kidney, Afib, HTN, CKD, CHF, anemia, mood disorder.  MRI on 11/7 revealed "Several scattered small acute infarcts in the white matter of the superior frontal lobes, left occipital lobe. No associated hemorrhage or mass effect. 2. Several small subacute or perhaps chronic infarcts in the bilateral cerebellum."      SLP Plan  Continue with current plan of care;MBS       Recommendations  Diet recommendations:  Dysphagia 1 (puree);Thin liquid Liquids provided via: Teaspoon;Cup;Straw Medication Administration: Crushed with puree Supervision: Staff to assist with self feeding;Full supervision/cueing for compensatory strategies Compensations: Minimize environmental distractions;Slow rate;Small sips/bites Postural Changes and/or Swallow Maneuvers: Seated upright 90 degrees                Oral Care Recommendations: Oral care BID Follow up Recommendations: LTACH SLP Visit Diagnosis: Dysphagia, oropharyngeal phase (R13.12) Plan: Continue with current plan of care;MBS       GO                Venita Sheffield Daejah Klebba 09/08/2019, 3:52 PM  [name], M.A. [place] Acute Environmental education officer [phone] Office [phone]

## 2019-09-08 NOTE — Progress Notes (Addendum)
Physical Therapy Treatment Patient Details Name: Jaclyn Day MRN: [id number] DOB: 13-Feb-1946 Today's Date: 09/08/2019    History of Present Illness 73yo female admitted after out of hospital cardiac arrest, unclear etiology CPR started by fire 10 min CPR with 1 Epi before ROSC. PMH includes a-fib, diastolic CHF, Mood disorder, CKD. CRRT initiated on 10/31 for volume removal. Pt transitioned to intermittent HD.     PT Comments    Pt in bed upon PT arrival, agreeable to PT session this morning. Pt was able to initiate movements for rolling and bed mobility, but requires mod/maxA to complete bilateral rolling (pt better towards left than right). Transfers performed with maxisky at this time for pt and therapist safety, but then performed general LE exercises while seated in the chair. The pt will continue to benefit from skilled PT to address functional mobility, independence with bed mobility, and seated balance.     Follow Up Recommendations  Supervision/Assistance - 24 hour;LTACH(if insurance refuses LTACH, possibly SNF)     Equipment Recommendations  None recommended by PT    Recommendations for Other Services       Precautions / Restrictions Precautions Precautions: Fall Precaution Comments: BUE and BLE weakness with digit extension limitations noted as well Restrictions Weight Bearing Restrictions: No    Mobility  Bed Mobility Overal bed mobility: Needs Assistance Bed Mobility: Supine to Sit;Sit to Supine;Rolling Rolling: +2 for physical assistance;Max assist         General bed mobility comments: pt required MAX A +2 for all bed mobility tasks. Pt noted to initiate moving B UE and B LE when asked despite responding "I can't" initially.  Transfers Overall transfer level: Needs assistance               General transfer comment: Pt transfered using maxisky for therapist and pt safety, pt is fearful of maxisky trasnfer, but agreeable.  Ambulation/Gait             General Gait Details: unable   Stairs             Wheelchair Mobility    Modified Rankin (Stroke Patients Only)       Balance Overall balance assessment: Needs assistance Sitting-balance support: Feet supported;Single extremity supported Sitting balance-Leahy Scale: Fair Sitting balance - Comments: pt unable to lean forward in recliner to sit without sig post support. Postural control: Posterior lean;Left lateral lean     Standing balance comment: unable                            Cognition Arousal/Alertness: Awake/alert Behavior During Therapy: Flat affect Overall Cognitive Status: Impaired/Different from baseline Area of Impairment: Attention;Following commands;Problem solving                 Orientation Level: Place;Time;Situation Current Attention Level: Focused Memory: Decreased short-term memory Following Commands: Follows one step commands with increased time Safety/Judgement: Decreased awareness of deficits   Problem Solving: Slow processing;Decreased initiation;Difficulty sequencing;Requires verbal cues;Requires tactile cues General Comments: pt benefits from simple, direct cues for instructions and tasks, as well as continued encouragement and reassurance with mobility.      Exercises General Exercises - Lower Extremity Ankle Circles/Pumps: AROM;Both;Seated;20 reps Long Arc Quad: Both;10 reps;Seated;AROM    General Comments        Pertinent Vitals/Pain Pain Assessment: Faces Faces Pain Scale: Hurts a little bit Pain Location: BLEs with movement Pain Descriptors / Indicators: Grimacing;Guarding Pain Intervention(s): Limited activity within  patient's tolerance;Monitored during session;Repositioned    Home Living                      Prior Function            PT Goals (current goals can now be found in the care plan section) Acute Rehab PT Goals Patient Stated Goal: go home PT Goal Formulation: With  patient Time For Goal Achievement: 09/19/19 Potential to Achieve Goals: Fair Progress towards PT goals: Progressing toward goals    Frequency    Min 2X/week      PT Plan Discharge plan needs to be updated    Co-evaluation              AM-PAC PT "6 Clicks" Mobility   Outcome Measure  Help needed turning from your back to your side while in a flat bed without using bedrails?: A Lot Help needed moving from lying on your back to sitting on the side of a flat bed without using bedrails?: A Lot Help needed moving to and from a bed to a chair (including a wheelchair)?: Total Help needed standing up from a chair using your arms (e.g., wheelchair or bedside chair)?: Total Help needed to walk in hospital room?: Total Help needed climbing 3-5 steps with a railing? : Total 6 Click Score: 8    End of Session Equipment Utilized During Treatment: Gait belt Activity Tolerance: Patient tolerated treatment well Patient left: with call bell/phone within reach;in chair;with chair alarm set;with family/visitor present Nurse Communication: Mobility status;Need for lift equipment PT Visit Diagnosis: Muscle weakness (generalized) (M62.81)     Time: 1030-1053 PT Time Calculation (min) (ACUTE ONLY): 23 min  Charges:  $Therapeutic Exercise: 8-22 mins $Therapeutic Activity: 8-22 mins                     Mickey Farber, PT, DPT   Acute Rehabilitation Department [phone]   Otho Bellows 09/08/2019, 12:15 PM

## 2019-09-08 NOTE — Progress Notes (Signed)
[facility] Progress Note    Assessment/ Plan:   # Dialysis dependent AKI: AKI likely ATN in the setting of shock, cardiac arrest and bacteremia.  She was on CRRT 10/30-11/1.  Kidney ultrasound showed solitary right kidney with cortical thinning and cysts.  Seen by palliative, full scope of care, potentially to LTAC?  s/p TDC with IR 11/11.   - Normally iHD per T Th Sat Schedule.  She had HD on 11/25 per holiday schedule - Will plan for next HD on 11/28 per TTS schedule - clearance had appeared improved but anuric - Cont to eval for possible AVF or AVG; had previously been felt to be too debilitated.  Note also on warfarin    - given renal failure would transition off of cymbalta - discontinued  # CKD stage IV - baseline Cr appears near 2.5 per limited Epic data.  CKD felt 2/2 HTN  #Hyperkalemia: managed with HD  #Acute encephalopathy: somewhat improved but still present.  11/7 MRI with scattered small acute infarcts and chronic infarcts noted.  #Cardiac arrest, acute on chronic systolic CHF: EF [id number] by echo, cardiology is following.  EF improved in TEE.  #MSSA bacteremia: abx per primary team.  End date six weeks from last negative culture 08/07/19 - 09/18/19.  #Hypotension: Stable.  Continue midodrine - will try to reduce to 5 mg TID   #Acute respiratory failure with hypoxia: HD with UF. Continue supportive care.   #Paroxysmal atrial fibrillation: Cardiology following.  On amiodarone. Anticoagulation per primary   #Anemia - multifactorial with renal failure: hgb 5.9 11/15 in setting of TDC ooze- IR has seen and stitched, Got 1 u PRBCs, on Aranesp 100 q Thursday   #Dispo: ? LTAC.  Denied at [facility].  Last spoke with HD coordinator/SW on 11/25.  Subjective:    Last HD on 11/25 with 1.5 kg UF. No urine charted.  Spoke with her boyfriend at bedside.  States she is eating better.  Review of systems   Denies shortness of breath or chest pain   Denies nausea or  vomiting     Objective:   BP 136/89 (BP Location: Left Arm)   Pulse 80   Temp (!) 97.5 F (36.4 C) (Oral)   Resp 19   Ht 5\' 6"  (1.676 m)   Wt 73.2 kg   SpO2 99%   BMI 26.05 kg/m   Intake/Output Summary (Last 24 hours) at 09/08/2019 1051 Last data filed at 09/08/2019 0850 Gross per 24 hour  Intake 1016 ml  Output 2 ml  Net 1014 ml   Physical Exam: General: NAD, awake  HEENT: NCAT sclera anicteric Heart: s1s2, no rub Lungs: clear to auscultation and normal work of breathing Abdomen:soft, Non-tender, non-distended Extremities: no pitting edema lower extremities; LUE 1+ edema on pillow Dialysis Access: RIJ TDC c/d/i Neuro - disoriented to time but conversant; oriented to person and location  Imaging: No results found.  Labs: BMET Recent Labs  Lab 09/02/19 1304  09/04/19 0608 09/04/19 1200 09/05/19 0357 09/05/19 1223 09/06/19 0352 09/07/19 0315 09/08/19 0315  NA 136  --   --  136  --  139 137 136 137  K 5.4*  --   --  5.6*  --  4.9 5.1 3.9 4.7  CL 97*  --   --  98  --  98 97* 96* 96*  CO2 27  --   --  29  --  30 28 30 27   GLUCOSE 137*  --   --  96  --  106* 109* 109* 110*  BUN 48*  --   --  47*  --  27* 41* 18 46*  CREATININE 3.75*  --   --  3.81*  --  2.69* 3.33* 1.72* 3.01*  CALCIUM 9.3  --   --  9.2  --  9.6 9.5 8.5* 9.1  PHOS 4.0   < > 4.6 4.6 3.7 4.4 5.3* 3.4 4.5   < > = values in this interval not displayed.   CBC Recent Labs  Lab 09/05/19 0357 09/06/19 0352 09/07/19 0315 09/08/19 0315  WBC 17.3* 18.1* 16.3* 16.3*  HGB 7.5* 7.8* 7.6* 8.8*  HCT 24.8* 26.1* 24.9* 28.5*  MCV 94.3 95.3 94.3 91.3  PLT 346 363 331 299    Medications:    .  stroke: mapping our early stages of recovery book   Does not apply Once  . sodium chloride   Intravenous Once  . amiodarone  200 mg Oral Daily  . ARIPiprazole  10 mg Oral BID  . atorvastatin  20 mg Oral Daily  . chlorhexidine  15 mL Mouth Rinse BID  . Chlorhexidine Gluconate Cloth  6 each Topical Q0600  .  cholecalciferol  1,000 Units Oral Daily  . [START ON 09/14/2019] darbepoetin (ARANESP) injection - DIALYSIS  100 mcg Intravenous Q Thu-HD  . DULoxetine  30 mg Oral Daily  . feeding supplement  1 Container Oral TID BM  . feeding supplement (NEPRO CARB STEADY)  237 mL Oral BID BM  . feeding supplement (PRO-STAT SUGAR FREE 64)  30 mL Oral BID  . ferrous sulfate  325 mg Oral Daily  . insulin aspart  0-9 Units Subcutaneous Q4H  . mouth rinse  15 mL Mouth Rinse BID  . midodrine  10 mg Oral TID WC  . multivitamin  1 tablet Oral QHS  . nystatin  5 mL Oral QID  . pantoprazole  40 mg Oral Daily  . sodium chloride flush  10-40 mL Intracatheter Q12H  . thiamine  100 mg Oral Daily  . Thrombi-Pad  1 each Topical Once  . valproic acid  250 mg Oral Q6H  . warfarin  2 mg Oral ONCE-1800  . Warfarin - Pharmacist Dosing Inpatient   Does not apply q1800    Claudia Desanctis 09/08/2019, 10:51 AM

## 2019-09-08 NOTE — Progress Notes (Signed)
Pt off BiPAP for multiple days, on room air tolerating well at this time. RT will continue to monitor.

## 2019-09-08 NOTE — Progress Notes (Signed)
ANTICOAGULATION CONSULT NOTE - Follow-Up Consult  Pharmacy Consult for Warfarin Indication: atrial fibrillation  Allergies  Allergen Reactions  . Codeine Other (See Comments)    Reaction not recalled by family- was told to never take this    Patient Measurements: Height: 5\' 6"  (167.6 cm) Weight: 161 lb 6 oz (73.2 kg) IBW/kg (Calculated) : 59.3  Vital Signs: Temp: 97.5 F (36.4 C) (11/27 0754) Temp Source: Oral (11/27 0754) BP: 136/89 (11/27 0754) Pulse Rate: 80 (11/27 0754)  Labs: Recent Labs    09/06/19 0352 09/07/19 0315 09/08/19 0315  HGB 7.8* 7.6* 8.8*  HCT 26.1* 24.9* 28.5*  PLT 363 331 299  LABPROT 21.0* 22.4* 26.0*  INR 1.8* 2.0* 2.4*  CREATININE 3.33* 1.72* 3.01*    Estimated Creatinine Clearance: 17.3 mL/min (A) (by C-G formula based on SCr of 3.01 mg/dL (H)).   Medical History: Past Medical History:  Diagnosis Date  . Anemia   . Chronic diastolic CHF (congestive heart failure) ([place])   . CKD (chronic kidney disease), stage IV ([address])   . Hypertension   . Persistent atrial fibrillation ([place])    a. dx 01/2019 s/p TEE DCCV.  Marland Kitchen Renal disorder   . Unilateral congenital absence of kidney    Assessment: Pt is a 73 yr old female admitted s/p out of hospital cardiac arrest and TTM. Admit c/b by encephalopathy, AKI which progressed to HD, and ?embolic CVA. Pt on apixaban PTA for Afib, which was held for IV heparin. Heparin subsequently held on 11/16 due to bleeding from HD lines and low Hgb. Warfarin started 11/18 with no bridge planned. Patient at amiodarone PO with no plans for DCCV.  INR remains therapeutic at 2.4. Hgb 8.8, plt 299- s/p 1 PRBC on 11/26. No s/sx of bleeding. Oral intake documented between 10-60%.   Goal of Therapy:  INR 2-3 Monitor platelets by anticoagulation protocol: Yes   Plan:  Warfarin 2 mg PO x1 tonight Daily INR Monitor for s/sx of bleeding  Antonietta Jewel, PharmD, BCCCP Clinical Pharmacist  Phone: [phone]  Please check AMION  for all [place] phone numbers After 10:00 PM, call [place] [phone]

## 2019-09-09 ENCOUNTER — Inpatient Hospital Stay (HOSPITAL_COMMUNITY): Payer: 59

## 2019-09-09 DIAGNOSIS — R7881 Bacteremia: Secondary | ICD-10-CM | POA: Diagnosis not present

## 2019-09-09 DIAGNOSIS — I5043 Acute on chronic combined systolic (congestive) and diastolic (congestive) heart failure: Secondary | ICD-10-CM | POA: Diagnosis not present

## 2019-09-09 DIAGNOSIS — I469 Cardiac arrest, cause unspecified: Secondary | ICD-10-CM | POA: Diagnosis not present

## 2019-09-09 DIAGNOSIS — I330 Acute and subacute infective endocarditis: Secondary | ICD-10-CM | POA: Diagnosis not present

## 2019-09-09 LAB — GLUCOSE, CAPILLARY
Glucose-Capillary: 101 mg/dL — ABNORMAL HIGH (ref 70–99)
Glucose-Capillary: 76 mg/dL (ref 70–99)
Glucose-Capillary: 90 mg/dL (ref 70–99)
Glucose-Capillary: 94 mg/dL (ref 70–99)
Glucose-Capillary: 98 mg/dL (ref 70–99)
Glucose-Capillary: 99 mg/dL (ref 70–99)

## 2019-09-09 LAB — CBC
HCT: 29.7 % — ABNORMAL LOW (ref 36.0–46.0)
Hemoglobin: 9.3 g/dL — ABNORMAL LOW (ref 12.0–15.0)
MCH: 28.3 pg (ref 26.0–34.0)
MCHC: 31.3 g/dL (ref 30.0–36.0)
MCV: 90.3 fL (ref 80.0–100.0)
Platelets: 270 10*3/uL (ref 150–400)
RBC: 3.29 MIL/uL — ABNORMAL LOW (ref 3.87–5.11)
RDW: 16.7 % — ABNORMAL HIGH (ref 11.5–15.5)
WBC: 14.7 10*3/uL — ABNORMAL HIGH (ref 4.0–10.5)
nRBC: 0 % (ref 0.0–0.2)

## 2019-09-09 LAB — RENAL FUNCTION PANEL
Albumin: 2 g/dL — ABNORMAL LOW (ref 3.5–5.0)
Anion gap: 17 — ABNORMAL HIGH (ref 5–15)
BUN: 69 mg/dL — ABNORMAL HIGH (ref 8–23)
CO2: 25 mmol/L (ref 22–32)
Calcium: 9.5 mg/dL (ref 8.9–10.3)
Chloride: 95 mmol/L — ABNORMAL LOW (ref 98–111)
Creatinine, Ser: 3.89 mg/dL — ABNORMAL HIGH (ref 0.44–1.00)
GFR calc Af Amer: 13 mL/min — ABNORMAL LOW (ref >60)
GFR calc non Af Amer: 11 mL/min — ABNORMAL LOW (ref >60)
Glucose, Bld: 89 mg/dL (ref 70–99)
Phosphorus: 5.7 mg/dL — ABNORMAL HIGH (ref 2.5–4.6)
Potassium: 5.3 mmol/L — ABNORMAL HIGH (ref 3.5–5.1)
Sodium: 137 mmol/L (ref 135–145)

## 2019-09-09 LAB — MAGNESIUM: Magnesium: 2.2 mg/dL (ref 1.7–2.4)

## 2019-09-09 LAB — PROTIME-INR
INR: 2.6 — ABNORMAL HIGH (ref 0.8–1.2)
Prothrombin Time: 27.8 seconds — ABNORMAL HIGH (ref 11.4–15.2)

## 2019-09-09 MED ORDER — SODIUM BICARBONATE 650 MG PO TABS
650.0000 mg | ORAL_TABLET | Freq: Once | ORAL | Status: AC
  Administered 2019-09-15: 650 mg
  Filled 2019-09-09 (×2): qty 1

## 2019-09-09 MED ORDER — WARFARIN SODIUM 2 MG PO TABS
2.0000 mg | ORAL_TABLET | Freq: Once | ORAL | Status: AC
  Administered 2019-09-09: 2 mg via ORAL
  Filled 2019-09-09: qty 1

## 2019-09-09 MED ORDER — VALPROIC ACID 250 MG/5ML PO SOLN
250.0000 mg | Freq: Four times a day (QID) | ORAL | Status: DC
  Administered 2019-09-09 – 2019-09-11 (×7): 250 mg via ORAL
  Filled 2019-09-09 (×9): qty 5

## 2019-09-09 MED ORDER — HEPARIN SODIUM (PORCINE) 1000 UNIT/ML IJ SOLN
INTRAMUSCULAR | Status: AC
  Filled 2019-09-09: qty 4

## 2019-09-09 MED ORDER — PANCRELIPASE (LIP-PROT-AMYL) 10440-39150 UNITS PO TABS
20880.0000 [IU] | ORAL_TABLET | Freq: Once | ORAL | Status: DC
  Filled 2019-09-09 (×2): qty 2

## 2019-09-09 NOTE — Progress Notes (Signed)
PROGRESS NOTE  Jaclyn Day [id number] DOB: [birth year]   PCP: Cyndi Bender, PA-C  Patient is from: Home  DOA: 08/03/2019 LOS: 55  Brief Narrative / Interim history: 73 year old female with history of congenital unilateral kidney, CKD-4, HTN, persistent A. fib on Eliquis and diastolic CHF admitted to ICU 08/03/2023 cardiac arrest.  Found to have new systolic CHF with EF of 20 to 25%, global hypokinesis.  Blood culture grew MSSA for which she was started on vancomycin and transition to Ancef on 08/06/2019.  Patient had echocardiogram on 08/21/2019 that showed EF of 55 to 60%, moderate LAE and RVSP to 39.  Hospital course complicated by A. fib with RVR, acute metabolic encephalopathy/agitation and AKI.  She is a still with cortrack for feeding.  Subjective: Cortrack clogged last night.  TF feeding stopped.  Eating 50% of her meals.  CBG was in fair range. Patient has no complaints.  She denies chest pain, dyspnea or GI symptoms.  She is oriented x3+ but limited insight.  She does not recall seeing me yesterday.  Objective: Vitals:   09/09/19 0431 09/09/19 0814 09/09/19 1152 09/09/19 1216  BP: 140/77 127/72 140/78 127/81  Pulse: 91 85 92   Resp: 18 20 (!) 25 (!) 28  Temp: 97.6 F (36.4 C) (!) 97.5 F (36.4 C) (!) 97.4 F (36.3 C)   TempSrc: Oral Axillary Axillary   SpO2: 97% 98% 98%   Weight: 73 kg     Height:        Intake/Output Summary (Last 24 hours) at 09/09/2019 1505 Last data filed at 09/09/2019 1129 Gross per 24 hour  Intake 732 ml  Output -  Net 732 ml   Filed Weights   09/07/19 0402 09/08/19 0500 09/09/19 0431  Weight: 73.1 kg 73.2 kg 73 kg    Examination:  GENERAL: No acute distress.  Appears well.  HEENT: MMM.  Vision and hearing grossly intact.  Cortrack in place. NECK: Supple.  No apparent JVD.  RESP:  No IWOB. Good air movement bilaterally. CVS:  RRR. Heart sounds normal.  ABD/GI/GU: Bowel sounds present. Soft. Non tender.  MSK/EXT: No  apparent deformity.  Trace edema.  Extremity weakness as below. SKIN: no apparent skin lesion or wound NEURO: Awake, alert and oriented appropriately.  Motor 3/5 in RUE and 2/5 elsewhere. PSYCH: Calm. Normal affect.  Limited insight.   Assessment & Plan: Acute respiratory failure with hypoxia "multifactorial including cardiac arrest, pulmonary edema, bilateral pleural effusion and anxiety.  Resolved.  She is now on room air. -treat treatable causes.  MSSA bacteremia/AV endocarditis:  -Continue Ancef until 09/18/2019 per ID-total of 6 weeks -Check CBC with differential in the morning.  A. fib with RVR: Stable -Continue p.o. amiodarone and warfarin  V. tach/cardiac arrest/acute systolic CHF: Initial TTE on 10/23 with EF of 20 to 25%.  Repeat TTE on 11/9 with EF of 55 to 60%.  Currently euvolemic except for trace edema. -Fluid management with dialysis--1.5 L with HD 11/25. -S/p ETT and hypothermia protocol in ICU  Acute metabolic encephalopathy: Resolved.  She is oriented x4 except year but limited insight -Frequent reorientation and delirium precautions.  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  Patient with significant upper and lower extremity weakness left greater than right mainly in upper extremities. -Continue warfarin per pharmacy  Anemia: Multifactorial-acute blood loss from HD cath 11/15, chronic disease and malnutrition. -Hgb 11.6 (admit and baseline)>> 6.9 (11/10)>1u> 8.2>> 5.9 (11/16)> 8.8>> 7.6>1u>8.8> 9.3 -  On Aranesp and iron per nephro -Optimize nutrition -Monitor H&H  CKD-4: Likely hemodynamically mediated in the setting of cardiac issues and bacteremia -CRRT 10/30-11/1>>IHD  -Continue monitoring  Metabolic acidosis/bone mineral disorder/hyperkalemia: Likely due to renal failure. -Per nephrology.  Hypotension: Normotensive. -Continue midodrine.  Dysphagia: SLP and dietitian following.    -Cortrack in place but plugged off-will obtain KUB -Dysphagia 1 diet per SLP.  Oral thrush -Continue nystatin  Vitamin D deficiency -On weekly vitamin D 50,000 units    Moderate malnutrition: Nutrition Problem: Moderate Malnutrition Etiology: acute illness(acute respiratory failure, AKI required CRRT, endocarditis)  Signs/Symptoms: mild fat depletion, mild muscle depletion, moderate muscle depletion  Interventions: Tube feeding   DVT prophylaxis: Subcu heparin Code Status: Full code Family Communication: Patient and/or RN. Available if any question. Disposition Plan: Remains inpatient Consultants: Cardiology (signed off), neurology (signed off), IR (signed off), palliative, nephrology  Procedures:  HD cath placement CRRT IHD  Microbiology summarized: 10/22-COVID-19 negative 10/22-MRSA PCR negative 10/22-respiratory viral panel negative 10/22-urine culture with E. Coli 10/22-blood culture with MSSA in 1/2 bottles 10/26, 10/29, 11/16 and 11/20-blood cultures negative  Sch Meds:  Scheduled Meds: .  stroke: mapping our early stages of recovery book   Does not apply Once  . sodium chloride   Intravenous Once  . amiodarone  200 mg Oral Daily  . ARIPiprazole  10 mg Oral BID  . atorvastatin  20 mg Oral Daily  . chlorhexidine  15 mL Mouth Rinse BID  . Chlorhexidine Gluconate Cloth  6 each Topical Q0600  . coumadin book   Does not apply Once  . [START ON 09/14/2019] darbepoetin (ARANESP) injection - DIALYSIS  100 mcg Intravenous Q Thu-HD  . feeding supplement  1 Container Oral TID BM  . feeding supplement (NEPRO CARB STEADY)  237 mL Oral BID BM  . feeding supplement (PRO-STAT SUGAR FREE 64)  30 mL Oral BID  . ferrous sulfate  325 mg Oral Daily  . insulin aspart  0-9 Units Subcutaneous Q4H  . mouth rinse  15 mL Mouth Rinse BID  . midodrine  5 mg Oral TID WC  . multivitamin  1 tablet Oral QHS  . nystatin  5 mL Oral QID  . pantoprazole  40 mg Oral Daily  . sodium chloride  flush  10-40 mL Intracatheter Q12H  . thiamine  100 mg Oral Daily  . Thrombi-Pad  1 each Topical Once  . valproic acid  250 mg Oral Q6H  . warfarin  2 mg Oral ONCE-1800  . Warfarin - Pharmacist Dosing Inpatient   Does not apply q1800   Continuous Infusions: . sodium chloride 10 mL (08/20/19 2025)  . sodium chloride    . sodium chloride    .  ceFAZolin (ANCEF) IV    . feeding supplement (OSMOLITE 1.5 CAL) Stopped (09/08/19 2054)   PRN Meds:.sodium chloride, sodium chloride, acetaminophen, alteplase, heparin, hydrALAZINE, HYDROmorphone (DILAUDID) injection, levalbuterol, lidocaine (PF), lidocaine-prilocaine, pentafluoroprop-tetrafluoroeth, sodium chloride flush  Antimicrobials: Anti-infectives (From admission, onward)   Start     Dose/Rate Route Frequency Ordered Stop   09/09/19 1800  ceFAZolin (ANCEF) IVPB 2g/100 mL premix     2 g 200 mL/hr over 30 Minutes Intravenous Every T-Th-Sa (1800) 09/05/19 1024     09/04/19 1800  ceFAZolin (ANCEF) IVPB 2g/100 mL premix     2 g 200 mL/hr over 30 Minutes Intravenous Every M-W-F (1800) 09/04/19 1335 09/06/19 1736   08/31/19 1800  ceFAZolin (ANCEF) IVPB 2g/100 mL premix  Status:  Discontinued  2 g 200 mL/hr over 30 Minutes Intravenous Every T-Th-Sa (1800) 08/31/19 1401 09/04/19 1335   08/30/19 0900  ceFAZolin (ANCEF) IVPB 2g/100 mL premix     2 g 200 mL/hr over 30 Minutes Intravenous  Once 08/30/19 0854 08/30/19 1134   08/24/19 1200  ceFAZolin (ANCEF) IVPB 2g/100 mL premix  Status:  Discontinued     2 g 200 mL/hr over 30 Minutes Intravenous Every T-Th-Sa (Hemodialysis) 08/23/19 0807 08/31/19 1401   08/20/19 2200  ceFAZolin (ANCEF) IVPB 1 g/50 mL premix  Status:  Discontinued     1 g 100 mL/hr over 30 Minutes Intravenous Daily at bedtime 08/20/19 1507 08/23/19 0807   08/15/19 2200  ceFAZolin (ANCEF) IVPB 2g/100 mL premix  Status:  Discontinued     2 g 200 mL/hr over 30 Minutes Intravenous Daily at bedtime 08/15/19 1133 08/20/19 1507    08/11/19 1800  ceFAZolin (ANCEF) IVPB 2g/100 mL premix  Status:  Discontinued     2 g 200 mL/hr over 30 Minutes Intravenous Every 12 hours 08/11/19 1110 08/15/19 1133   08/10/19 1600  ceFAZolin (ANCEF) IVPB 1 g/50 mL premix  Status:  Discontinued     1 g 100 mL/hr over 30 Minutes Intravenous Every 12 hours 08/10/19 1530 08/11/19 1110   08/06/19 1800  ceFAZolin (ANCEF) IVPB 1 g/50 mL premix  Status:  Discontinued     1 g 100 mL/hr over 30 Minutes Intravenous Every 24 hours 08/06/19 1025 08/10/19 1530   08/05/19 1530  vancomycin (VANCOCIN) 1,750 mg in sodium chloride 0.9 % 500 mL IVPB     1,750 mg 250 mL/hr over 120 Minutes Intravenous  Once 08/05/19 1444 08/05/19 1736   08/05/19 1443  vancomycin variable dose per unstable renal function (pharmacist dosing)  Status:  Discontinued      Does not apply See admin instructions 08/05/19 1444 08/06/19 1025   08/03/19 1300  cefTRIAXone (ROCEPHIN) 2 g in sodium chloride 0.9 % 100 mL IVPB  Status:  Discontinued     2 g 200 mL/hr over 30 Minutes Intravenous Every 24 hours 08/03/19 1251 08/06/19 1025       I have personally reviewed the following labs and images: CBC: Recent Labs  Lab 09/05/19 0357 09/06/19 0352 09/07/19 0315 09/08/19 0315 09/09/19 0347  WBC 17.3* 18.1* 16.3* 16.3* 14.7*  HGB 7.5* 7.8* 7.6* 8.8* 9.3*  HCT 24.8* 26.1* 24.9* 28.5* 29.7*  MCV 94.3 95.3 94.3 91.3 90.3  PLT 346 363 331 299 270   BMP &GFR Recent Labs  Lab 09/05/19 1223 09/06/19 0352 09/07/19 0315 09/08/19 0315 09/09/19 0347  NA 139 137 136 137 137  K 4.9 5.1 3.9 4.7 5.3*  CL 98 97* 96* 96* 95*  CO2 30 28 30 27 25   GLUCOSE 106* 109* 109* 110* 89  BUN 27* 41* 18 46* 69*  CREATININE 2.69* 3.33* 1.72* 3.01* 3.89*  CALCIUM 9.6 9.5 8.5* 9.1 9.5  MG  --   --  1.9 2.1 2.2  PHOS 4.4 5.3* 3.4 4.5 5.7*   Estimated Creatinine Clearance: 13.4 mL/min (A) (by C-G formula based on SCr of 3.89 mg/dL (H)). Liver & Pancreas: Recent Labs  Lab 09/05/19 1223  09/06/19 0352 09/07/19 0315 09/08/19 0315 09/09/19 0347  ALBUMIN 1.8* 1.7* 1.7* 1.8* 2.0*   No results for input(s): LIPASE, AMYLASE in the last 168 hours. No results for input(s): AMMONIA in the last 168 hours. Diabetic: No results for input(s): HGBA1C in the last 72 hours. Recent Labs  Lab 09/08/19 2002  09/09/19 0034 09/09/19 0438 09/09/19 0811 09/09/19 1147  GLUCAP 161* 90 98 76 94   Cardiac Enzymes: No results for input(s): CKTOTAL, CKMB, CKMBINDEX, TROPONINI in the last 168 hours. No results for input(s): PROBNP in the last 8760 hours. Coagulation Profile: Recent Labs  Lab 09/05/19 0357 09/06/19 0352 09/07/19 0315 09/08/19 0315 09/09/19 0347  INR 1.8* 1.8* 2.0* 2.4* 2.6*   Thyroid Function Tests: No results for input(s): TSH, T4TOTAL, FREET4, T3FREE, THYROIDAB in the last 72 hours. Lipid Profile: No results for input(s): CHOL, HDL, LDLCALC, TRIG, CHOLHDL, LDLDIRECT in the last 72 hours. Anemia Panel: No results for input(s): VITAMINB12, FOLATE, FERRITIN, TIBC, IRON, RETICCTPCT in the last 72 hours. Urine analysis:    Component Value Date/Time   COLORURINE BROWN (A) 08/05/2019 2018   APPEARANCEUR TURBID (A) 08/05/2019 2018   LABSPEC RESULTS UNAVAILABLE DUE TO INTERFERING SUBSTANCE 08/05/2019 2018   PHURINE RESULTS UNAVAILABLE DUE TO INTERFERING SUBSTANCE 08/05/2019 2018   GLUCOSEU RESULTS UNAVAILABLE DUE TO INTERFERING SUBSTANCE (A) 08/05/2019 2018   HGBUR RESULTS UNAVAILABLE DUE TO INTERFERING SUBSTANCE (A) 08/05/2019 2018   BILIRUBINUR RESULTS UNAVAILABLE DUE TO INTERFERING SUBSTANCE (A) 08/05/2019 2018   KETONESUR RESULTS UNAVAILABLE DUE TO INTERFERING SUBSTANCE (A) 08/05/2019 2018   PROTEINUR RESULTS UNAVAILABLE DUE TO INTERFERING SUBSTANCE (A) 08/05/2019 2018   UROBILINOGEN 0.2 05/07/2009 1520   NITRITE RESULTS UNAVAILABLE DUE TO INTERFERING SUBSTANCE (A) 08/05/2019 2018   LEUKOCYTESUR RESULTS UNAVAILABLE DUE TO INTERFERING SUBSTANCE (A) 08/05/2019 2018    Sepsis Labs: Invalid input(s): PROCALCITONIN, [place]  Microbiology: Recent Results (from the past 240 hour(s))  Culture, blood (routine x 2)     Status: None   Collection Time: 09/01/19  4:26 PM   Specimen: BLOOD LEFT HAND  Result Value Ref Range Status   Specimen Description BLOOD LEFT HAND  Final   Special Requests   Final    BOTTLES DRAWN AEROBIC ONLY Blood Culture adequate volume   Culture   Final    NO GROWTH 5 DAYS Performed at [facility], [address] [place], [place] [address]    Report Status 09/06/2019 FINAL  Final  Culture, blood (routine x 2)     Status: None   Collection Time: 09/01/19  4:26 PM   Specimen: BLOOD LEFT HAND  Result Value Ref Range Status   Specimen Description BLOOD LEFT HAND  Final   Special Requests   Final    BOTTLES DRAWN AEROBIC ONLY Blood Culture adequate volume   Culture   Final    NO GROWTH 5 DAYS Performed at [facility], [place] [address], [place], [place] [address]    Report Status 09/06/2019 FINAL  Final    Radiology Studies: No results found.  Gabriell Casimir T. [place]  If 7PM-7AM, please contact night-coverage [email or web address] Password [facility] 09/09/2019, 3:05 PM

## 2019-09-09 NOTE — Progress Notes (Signed)
[facility] Progress Note    Assessment/ Plan:   # Dialysis dependent AKI: AKI likely ATN in the setting of shock, cardiac arrest and bacteremia.  She was on CRRT 10/30-11/1.  Kidney ultrasound showed solitary right kidney with cortical thinning and cysts.  Seen by palliative, full scope of care, potentially to LTAC?  s/p TDC with IR 11/11.   - Normally iHD per T Th Sat Schedule.  She had HD on 11/25 per holiday schedule - HD 11/28 per TTS schedule - Cont to eval for possible AVF or AVG; had previously been felt to be too debilitated.  Improving somewhat.  Note also on warfarin     # CKD stage IV - baseline Cr appears near 2.5 per limited Epic data.  CKD felt 2/2 HTN  #Hyperkalemia: managed with HD  #Acute encephalopathy: improved but still present.  11/7 MRI with scattered small acute infarcts and chronic infarcts noted.  #Cardiac arrest, acute on chronic systolic CHF: EF [id number] by echo, cardiology is following.  EF improved in TEE.  #MSSA bacteremia: abx per primary team.  End date six weeks from last negative culture 08/07/19 - 09/18/19.  #Hypotension: Stable.  Continue midodrine - was reduced to 5 mg TID (from 10 mg TID)   #Acute respiratory failure with hypoxia: HD with UF. Continue supportive care.   #Paroxysmal atrial fibrillation: Cardiology following.  On amiodarone. Anticoagulation per primary   #Anemia - multifactorial with renal failure: hgb 5.9 11/15 in setting of TDC ooze- IR has seen and stitched, Got 1 u PRBCs, on Aranesp 100 q Thursday   #Dispo: ? LTAC.  Denied at [facility].  Last spoke with HD coordinator/SW on 11/25.  Subjective:    Last HD on 11/25 with 1.5 kg UF.  She has no urine charted.  Spoke with her significant other at bedside and he states they are still trying to appeal decision and go to [facility]  Review of systems    Denies shortness of breath or chest pain   had nausea this morning which is atypical - thinks it was something she  ate   Eating more     Objective:   BP 127/81   Pulse 92   Temp (!) 97.4 F (36.3 C) (Axillary)   Resp (!) 28   Ht 5\' 6"  (1.676 m)   Wt 73 kg   SpO2 98%   BMI 25.98 kg/m   Intake/Output Summary (Last 24 hours) at 09/09/2019 1304 Last data filed at 09/09/2019 0750 Gross per 24 hour  Intake 532 ml  Output -  Net 532 ml   Physical Exam: General: elderly female in bed  HEENT: NCAT sclera anicteric Heart: s1s2, no rub Lungs: clear to auscultation and normal work of breathing Abdomen:soft, Non-tender, non-distended Extremities: no pitting edema lower extremities; LUE 1+ edema on pillow Dialysis Access: [facility] c/d/i Neuro - disoriented to time "2009" but conversant; oriented to person and location  Imaging: No results found.  Labs: BMET Recent Labs  Lab 09/04/19 1200 09/05/19 0357 09/05/19 1223 09/06/19 0352 09/07/19 0315 09/08/19 0315 09/09/19 0347  NA 136  --  139 137 136 137 137  K 5.6*  --  4.9 5.1 3.9 4.7 5.3*  CL 98  --  98 97* 96* 96* 95*  CO2 29  --  30 28 30 27 25   GLUCOSE 96  --  106* 109* 109* 110* 89  BUN 47*  --  27* 41* 18 46* 69*  CREATININE 3.81*  --  2.69* 3.33* 1.72* 3.01* 3.89*  CALCIUM 9.2  --  9.6 9.5 8.5* 9.1 9.5  PHOS 4.6 3.7 4.4 5.3* 3.4 4.5 5.7*   CBC Recent Labs  Lab 09/06/19 0352 09/07/19 0315 09/08/19 0315 09/09/19 0347  WBC 18.1* 16.3* 16.3* 14.7*  HGB 7.8* 7.6* 8.8* 9.3*  HCT 26.1* 24.9* 28.5* 29.7*  MCV 95.3 94.3 91.3 90.3  PLT 363 331 299 270    Medications:    .  stroke: mapping our early stages of recovery book   Does not apply Once  . sodium chloride   Intravenous Once  . amiodarone  200 mg Oral Daily  . ARIPiprazole  10 mg Oral BID  . atorvastatin  20 mg Oral Daily  . chlorhexidine  15 mL Mouth Rinse BID  . Chlorhexidine Gluconate Cloth  6 each Topical Q0600  . coumadin book   Does not apply Once  . [START ON 09/14/2019] darbepoetin (ARANESP) injection - DIALYSIS  100 mcg Intravenous Q Thu-HD  . feeding  supplement  1 Container Oral TID BM  . feeding supplement (NEPRO CARB STEADY)  237 mL Oral BID BM  . feeding supplement (PRO-STAT SUGAR FREE 64)  30 mL Oral BID  . ferrous sulfate  325 mg Oral Daily  . insulin aspart  0-9 Units Subcutaneous Q4H  . mouth rinse  15 mL Mouth Rinse BID  . midodrine  5 mg Oral TID WC  . multivitamin  1 tablet Oral QHS  . nystatin  5 mL Oral QID  . pantoprazole  40 mg Oral Daily  . sodium chloride flush  10-40 mL Intracatheter Q12H  . thiamine  100 mg Oral Daily  . Thrombi-Pad  1 each Topical Once  . valproic acid  250 mg Oral Q6H  . warfarin  2 mg Oral ONCE-1800  . Warfarin - Pharmacist Dosing Inpatient   Does not apply q1800    Claudia Desanctis 09/09/2019, 1:04 PM

## 2019-09-09 NOTE — Progress Notes (Signed)
ANTICOAGULATION CONSULT NOTE - Follow-Up Consult  Pharmacy Consult for Warfarin Indication: atrial fibrillation  Allergies  Allergen Reactions  . Codeine Other (See Comments)    Reaction not recalled by family- was told to never take this    Patient Measurements: Height: 5\' 6"  (167.6 cm) Weight: 160 lb 15 oz (73 kg) IBW/kg (Calculated) : 59.3  Vital Signs: Temp: 97.6 F (36.4 C) (11/28 0431) Temp Source: Oral (11/28 0431) BP: 140/77 (11/28 0431) Pulse Rate: 91 (11/28 0431)  Labs: Recent Labs    09/07/19 0315 09/08/19 0315 09/09/19 0347  HGB 7.6* 8.8* 9.3*  HCT 24.9* 28.5* 29.7*  PLT 331 299 270  LABPROT 22.4* 26.0* 27.8*  INR 2.0* 2.4* 2.6*  CREATININE 1.72* 3.01* 3.89*    Estimated Creatinine Clearance: 13.4 mL/min (A) (by C-G formula based on SCr of 3.89 mg/dL (H)).   Medical History: Past Medical History:  Diagnosis Date  . Anemia   . Chronic diastolic CHF (congestive heart failure) ([place])   . CKD (chronic kidney disease), stage IV ([place])   . Hypertension   . Persistent atrial fibrillation ([place])    a. dx 01/2019 s/p TEE DCCV.  Marland Kitchen Renal disorder   . Unilateral congenital absence of kidney    Assessment: Pt is a 73 yr old female admitted s/p out of hospital cardiac arrest and TTM. Admit c/b by encephalopathy, AKI which progressed to HD, and ?embolic CVA. Pt on apixaban PTA for Afib, which was held for IV heparin. Heparin subsequently held on 11/16 due to bleeding from HD lines and low Hgb. Warfarin started 11/18 with no bridge planned. Patient on amiodarone PO with no plans for DCCV.  INR remains therapeutic at 2.6. Hgb 9.3, plt 270- s/p 1 PRBC on 11/26. No overt bleeding noted. Oral intake documented between 60-90%.   Goal of Therapy:  INR 2-3 Monitor platelets by anticoagulation protocol: Yes   Plan:  Warfarin 2 mg PO x1 tonight Daily INR Monitor for s/sx of bleeding  Richardine Service, PharmD PGY1 Pharmacy Resident Phone: [phone] 09/09/2019  7:57  AM  Please check [email or web address] for unit-specific pharmacy phone numbers.

## 2019-09-10 DIAGNOSIS — R7881 Bacteremia: Secondary | ICD-10-CM | POA: Diagnosis not present

## 2019-09-10 DIAGNOSIS — I5043 Acute on chronic combined systolic (congestive) and diastolic (congestive) heart failure: Secondary | ICD-10-CM | POA: Diagnosis not present

## 2019-09-10 DIAGNOSIS — I469 Cardiac arrest, cause unspecified: Secondary | ICD-10-CM | POA: Diagnosis not present

## 2019-09-10 DIAGNOSIS — I330 Acute and subacute infective endocarditis: Secondary | ICD-10-CM | POA: Diagnosis not present

## 2019-09-10 LAB — RENAL FUNCTION PANEL
Albumin: 1.7 g/dL — ABNORMAL LOW (ref 3.5–5.0)
Anion gap: 10 (ref 5–15)
BUN: 27 mg/dL — ABNORMAL HIGH (ref 8–23)
CO2: 28 mmol/L (ref 22–32)
Calcium: 8.6 mg/dL — ABNORMAL LOW (ref 8.9–10.3)
Chloride: 98 mmol/L (ref 98–111)
Creatinine, Ser: 2.17 mg/dL — ABNORMAL HIGH (ref 0.44–1.00)
GFR calc Af Amer: 26 mL/min — ABNORMAL LOW (ref >60)
GFR calc non Af Amer: 22 mL/min — ABNORMAL LOW (ref >60)
Glucose, Bld: 140 mg/dL — ABNORMAL HIGH (ref 70–99)
Phosphorus: 3.7 mg/dL (ref 2.5–4.6)
Potassium: 4 mmol/L (ref 3.5–5.1)
Sodium: 136 mmol/L (ref 135–145)

## 2019-09-10 LAB — CBC WITH DIFFERENTIAL/PLATELET
Abs Immature Granulocytes: 0.36 10*3/uL — ABNORMAL HIGH (ref 0.00–0.07)
Basophils Absolute: 0.1 10*3/uL (ref 0.0–0.1)
Basophils Relative: 0 %
Eosinophils Absolute: 0 10*3/uL (ref 0.0–0.5)
Eosinophils Relative: 0 %
HCT: 29 % — ABNORMAL LOW (ref 36.0–46.0)
Hemoglobin: 8.9 g/dL — ABNORMAL LOW (ref 12.0–15.0)
Immature Granulocytes: 3 %
Lymphocytes Relative: 8 %
Lymphs Abs: 1.1 10*3/uL (ref 0.7–4.0)
MCH: 28 pg (ref 26.0–34.0)
MCHC: 30.7 g/dL (ref 30.0–36.0)
MCV: 91.2 fL (ref 80.0–100.0)
Monocytes Absolute: 1 10*3/uL (ref 0.1–1.0)
Monocytes Relative: 8 %
Neutro Abs: 10.4 10*3/uL — ABNORMAL HIGH (ref 1.7–7.7)
Neutrophils Relative %: 81 %
Platelets: 264 10*3/uL (ref 150–400)
RBC: 3.18 MIL/uL — ABNORMAL LOW (ref 3.87–5.11)
RDW: 16.8 % — ABNORMAL HIGH (ref 11.5–15.5)
WBC: 12.9 10*3/uL — ABNORMAL HIGH (ref 4.0–10.5)
nRBC: 0 % (ref 0.0–0.2)

## 2019-09-10 LAB — GLUCOSE, CAPILLARY
Glucose-Capillary: 105 mg/dL — ABNORMAL HIGH (ref 70–99)
Glucose-Capillary: 111 mg/dL — ABNORMAL HIGH (ref 70–99)
Glucose-Capillary: 120 mg/dL — ABNORMAL HIGH (ref 70–99)
Glucose-Capillary: 125 mg/dL — ABNORMAL HIGH (ref 70–99)
Glucose-Capillary: 85 mg/dL (ref 70–99)
Glucose-Capillary: 88 mg/dL (ref 70–99)

## 2019-09-10 LAB — PROTIME-INR
INR: 2.8 — ABNORMAL HIGH (ref 0.8–1.2)
Prothrombin Time: 29.4 seconds — ABNORMAL HIGH (ref 11.4–15.2)

## 2019-09-10 LAB — MAGNESIUM: Magnesium: 1.9 mg/dL (ref 1.7–2.4)

## 2019-09-10 MED ORDER — WARFARIN SODIUM 2 MG PO TABS
2.0000 mg | ORAL_TABLET | Freq: Once | ORAL | Status: AC
  Administered 2019-09-10: 2 mg via ORAL
  Filled 2019-09-10: qty 1

## 2019-09-10 NOTE — Progress Notes (Signed)
ANTICOAGULATION CONSULT NOTE - Follow-Up Consult  Pharmacy Consult for Warfarin Indication: atrial fibrillation  Allergies  Allergen Reactions  . Codeine Other (See Comments)    Reaction not recalled by family- was told to never take this    Patient Measurements: Height: 5\' 6"  (167.6 cm) Weight: 158 lb 11.7 oz (72 kg) IBW/kg (Calculated) : 59.3  Vital Signs: Temp: 98.2 F (36.8 C) (11/29 0350) Temp Source: Oral (11/29 0350) BP: 121/60 (11/29 0350) Pulse Rate: 96 (11/29 0350)  Labs: Recent Labs    09/08/19 0315 09/09/19 0347 09/10/19 0337  HGB 8.8* 9.3* 8.9*  HCT 28.5* 29.7* 29.0*  PLT 299 270 264  LABPROT 26.0* 27.8* 29.4*  INR 2.4* 2.6* 2.8*  CREATININE 3.01* 3.89* 2.17*    Estimated Creatinine Clearance: 23.8 mL/min (A) (by C-G formula based on SCr of 2.17 mg/dL (H)).   Medical History: Past Medical History:  Diagnosis Date  . Anemia   . Chronic diastolic CHF (congestive heart failure) ([place])   . CKD (chronic kidney disease), stage IV ([place])   . Hypertension   . Persistent atrial fibrillation ([place])    a. dx 01/2019 s/p TEE DCCV.  Marland Kitchen Renal disorder   . Unilateral congenital absence of kidney    Assessment: Pt is a 73 yr old female admitted s/p out of hospital cardiac arrest and TTM. Admit c/b by encephalopathy, AKI which progressed to HD, and ?embolic CVA. Pt on apixaban PTA for Afib, which was held for IV heparin. Heparin subsequently held on 11/16 due to bleeding from HD lines and low Hgb. Warfarin started 11/18 with no bridge planned. Patient on amiodarone PO with no plans for DCCV.  INR remains therapeutic at 2.8. Hgb 8.9, plt 264- s/p 1 PRBC on 11/26.   Goal of Therapy:  INR 2-3 Monitor platelets by anticoagulation protocol: Yes   Plan:  Warfarin 2 mg PO x1 tonight Daily INR Monitor for s/sx of bleeding  Lorel Monaco, PharmD PGY1 Ambulatory Care Resident [employer] # [phone]  Please check [email or web address] for unit-specific pharmacy phone numbers.

## 2019-09-10 NOTE — Progress Notes (Signed)
PROGRESS NOTE  Jaclyn Day [id number] DOB: 12/31/1945   PCP: Cyndi Bender, PA-C  Patient is from: Home  DOA: 08/03/2019 LOS: 33  Brief Narrative / Interim history: 73 year old female with history of congenital unilateral kidney, CKD-4, HTN, persistent A. fib on Eliquis and diastolic CHF admitted to ICU 08/03/2023 cardiac arrest.  Found to have new systolic CHF with EF of 20 to 25%, global hypokinesis.  Blood culture grew MSSA for which she was started on vancomycin and transition to Ancef on 08/06/2019.  Patient had echocardiogram on 08/21/2019 that showed EF of 55 to 60%, moderate LAE and RVSP to 39.  Hospital course complicated by A. fib with RVR, acute metabolic encephalopathy/agitation and AKI.  She is a still with cortrack for feeding.  Subjective: No major events overnight of this morning.  Overnight RN was able to unclog cortrack and initiate tube feed.  No complaints.  She denies chest pain, dyspnea, abdominal pain or new focal neuro symptoms.  Objective: Vitals:   09/10/19 0350 09/10/19 0550 09/10/19 0809 09/10/19 1159  BP: 121/60  (!) 113/57 121/74  Pulse: 96  90 91  Resp: (!) 22  17 (!) 26  Temp: 98.2 F (36.8 C)  97.6 F (36.4 C) (!) 97.4 F (36.3 C)  TempSrc: Oral  Axillary Axillary  SpO2: 98%  100% 96%  Weight:  72 kg    Height:        Intake/Output Summary (Last 24 hours) at 09/10/2019 1445 Last data filed at 09/10/2019 0900 Gross per 24 hour  Intake 447 ml  Output 1050 ml  Net -603 ml   Filed Weights   09/08/19 0500 09/09/19 0431 09/10/19 0550  Weight: 73.2 kg 73 kg 72 kg    Examination:  GENERAL: No acute distress.  Appears well.  HEENT: MMM.  Vision and hearing grossly intact.  Cortrak in place NECK: Supple.  No apparent JVD.  RESP:  No IWOB.  Fair air movement bilaterally. CVS:  RRR. Heart sounds normal.  ABD/GI/GU: Bowel sounds present. Soft. Non tender.  MSK/EXT: Bilateral lower extremity weakness.  No edema. SKIN: Reportedly  stage I sacral skin injury. NEURO: Awake, alert and oriented fairly.  Motor 3/5 in RUE and 2/5 elsewhere. PSYCH: Calm. Normal affect.  Limited insight to her medical condition.  Assessment & Plan: Acute respiratory failure with hypoxia "multifactorial including cardiac arrest, pulmonary edema, bilateral pleural effusion and anxiety.  Resolved.  She is now on room air. -treat treatable causes.  MSSA bacteremia/AV endocarditis: Bandemia improving.  Remains afebrile -Continue Ancef until 09/18/2019 per ID-total of 6 weeks  A. fib with RVR: Stable -Continue p.o. amiodarone and warfarin  V. tach/cardiac arrest/acute systolic CHF: Initial TTE on 10/23 with EF of 20 to 25%.  Repeat TTE on 11/9 with EF of 55 to 60%.  Currently euvolemic.  -Fluid management with dialysis--1.5 L with HD 11/25. -S/p ETT and hypothermia protocol in ICU  Acute metabolic encephalopathy: Resolved.  She is oriented x4 except year but limited insight -Frequent reorientation and delirium precautions.  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  Patient with significant upper and lower extremity weakness left greater than right mainly in upper extremities. -Continue warfarin per pharmacy-INR therapeutic  Anemia: Multifactorial-acute blood loss from HD cath 11/15, chronic disease and malnutrition. -Hgb 11.6 (admit and baseline)>> 6.9 (11/10)>1u> 8.2>> 5.9 (11/16)> 8.8>> 7.6>1u>8.8> 8.9 -On Aranesp and iron per nephro -Optimize nutrition -Monitor H&H  CKD-4: Likely hemodynamically mediated in the setting  of cardiac issues and bacteremia -CRRT 10/30-11/1>>IHD  -Continue monitoring  Metabolic acidosis/bone mineral disorder/hyperkalemia: Likely due to renal failure. -Per nephrology.  Hypotension: Normotensive. -Continue midodrine.  Dysphagia: SLP and dietitian following.   -Continue tube feed per Cortrack -Dysphagia 1 diet per SLP-continue calorie count   Oral thrush -Continue nystatin  Vitamin D deficiency -On weekly vitamin D 50,000 units  Stage I sacral decubitus ulcer:  -Dressing and offloading per RN  Moderate malnutrition: Nutrition Problem: Moderate Malnutrition Etiology: acute illness(acute respiratory failure, AKI required CRRT, endocarditis)  Signs/Symptoms: mild fat depletion, mild muscle depletion, moderate muscle depletion  Interventions: Tube feeding   DVT prophylaxis: Subcu heparin Code Status: Full code Family Communication: Patient and/or RN. Available if any question. Disposition Plan: Remains inpatient Consultants: Cardiology (signed off), neurology (signed off), IR (signed off), palliative, nephrology  Procedures:  HD cath placement CRRT IHD  Microbiology summarized: 10/22-COVID-19 negative 10/22-MRSA PCR negative 10/22-respiratory viral panel negative 10/22-urine culture with E. Coli 10/22-blood culture with MSSA in 1/2 bottles 10/26, 10/29, 11/16 and 11/20-blood cultures negative  Sch Meds:  Scheduled Meds: .  stroke: mapping our early stages of recovery book   Does not apply Once  . sodium chloride   Intravenous Once  . amiodarone  200 mg Oral Daily  . ARIPiprazole  10 mg Oral BID  . atorvastatin  20 mg Oral Daily  . chlorhexidine  15 mL Mouth Rinse BID  . Chlorhexidine Gluconate Cloth  6 each Topical Q0600  . coumadin book   Does not apply Once  . [START ON 09/14/2019] darbepoetin (ARANESP) injection - DIALYSIS  100 mcg Intravenous Q Thu-HD  . feeding supplement  1 Container Oral TID BM  . feeding supplement (NEPRO CARB STEADY)  237 mL Oral BID BM  . feeding supplement (PRO-STAT SUGAR FREE 64)  30 mL Oral BID  . ferrous sulfate  325 mg Oral Daily  . insulin aspart  0-9 Units Subcutaneous Q4H  . lipase/protease/amylase)  20,880 Units Per Tube Once   And  . sodium bicarbonate  650 mg Per Tube Once  . mouth rinse  15 mL Mouth Rinse BID  . multivitamin  1 tablet Oral QHS  . nystatin  5 mL  Oral QID  . pantoprazole  40 mg Oral Daily  . sodium chloride flush  10-40 mL Intracatheter Q12H  . thiamine  100 mg Oral Daily  . Thrombi-Pad  1 each Topical Once  . valproic acid  250 mg Oral Q6H  . warfarin  2 mg Oral ONCE-1800  . Warfarin - Pharmacist Dosing Inpatient   Does not apply q1800   Continuous Infusions: . sodium chloride 10 mL (08/20/19 2025)  . sodium chloride    . sodium chloride    .  ceFAZolin (ANCEF) IV 2 g (09/09/19 1844)  . feeding supplement (OSMOLITE 1.5 CAL) 1,000 mL (09/09/19 2246)   PRN Meds:.sodium chloride, sodium chloride, acetaminophen, alteplase, heparin, hydrALAZINE, HYDROmorphone (DILAUDID) injection, levalbuterol, lidocaine (PF), lidocaine-prilocaine, pentafluoroprop-tetrafluoroeth, sodium chloride flush  Antimicrobials: Anti-infectives (From admission, onward)   Start     Dose/Rate Route Frequency Ordered Stop   09/09/19 1800  ceFAZolin (ANCEF) IVPB 2g/100 mL premix     2 g 200 mL/hr over 30 Minutes Intravenous Every T-Th-Sa (1800) 09/05/19 1024     09/04/19 1800  ceFAZolin (ANCEF) IVPB 2g/100 mL premix     2 g 200 mL/hr over 30 Minutes Intravenous Every M-W-F (1800) 09/04/19 1335 09/06/19 1736   08/31/19 1800  ceFAZolin (ANCEF) IVPB 2g/100 mL  premix  Status:  Discontinued     2 g 200 mL/hr over 30 Minutes Intravenous Every T-Th-Sa (1800) 08/31/19 1401 09/04/19 1335   08/30/19 0900  ceFAZolin (ANCEF) IVPB 2g/100 mL premix     2 g 200 mL/hr over 30 Minutes Intravenous  Once 08/30/19 0854 08/30/19 1134   08/24/19 1200  ceFAZolin (ANCEF) IVPB 2g/100 mL premix  Status:  Discontinued     2 g 200 mL/hr over 30 Minutes Intravenous Every T-Th-Sa (Hemodialysis) 08/23/19 0807 08/31/19 1401   08/20/19 2200  ceFAZolin (ANCEF) IVPB 1 g/50 mL premix  Status:  Discontinued     1 g 100 mL/hr over 30 Minutes Intravenous Daily at bedtime 08/20/19 1507 08/23/19 0807   08/15/19 2200  ceFAZolin (ANCEF) IVPB 2g/100 mL premix  Status:  Discontinued     2 g 200  mL/hr over 30 Minutes Intravenous Daily at bedtime 08/15/19 1133 08/20/19 1507   08/11/19 1800  ceFAZolin (ANCEF) IVPB 2g/100 mL premix  Status:  Discontinued     2 g 200 mL/hr over 30 Minutes Intravenous Every 12 hours 08/11/19 1110 08/15/19 1133   08/10/19 1600  ceFAZolin (ANCEF) IVPB 1 g/50 mL premix  Status:  Discontinued     1 g 100 mL/hr over 30 Minutes Intravenous Every 12 hours 08/10/19 1530 08/11/19 1110   08/06/19 1800  ceFAZolin (ANCEF) IVPB 1 g/50 mL premix  Status:  Discontinued     1 g 100 mL/hr over 30 Minutes Intravenous Every 24 hours 08/06/19 1025 08/10/19 1530   08/05/19 1530  vancomycin (VANCOCIN) 1,750 mg in sodium chloride 0.9 % 500 mL IVPB     1,750 mg 250 mL/hr over 120 Minutes Intravenous  Once 08/05/19 1444 08/05/19 1736   08/05/19 1443  vancomycin variable dose per unstable renal function (pharmacist dosing)  Status:  Discontinued      Does not apply See admin instructions 08/05/19 1444 08/06/19 1025   08/03/19 1300  cefTRIAXone (ROCEPHIN) 2 g in sodium chloride 0.9 % 100 mL IVPB  Status:  Discontinued     2 g 200 mL/hr over 30 Minutes Intravenous Every 24 hours 08/03/19 1251 08/06/19 1025       I have personally reviewed the following labs and images: CBC: Recent Labs  Lab 09/06/19 0352 09/07/19 0315 09/08/19 0315 09/09/19 0347 09/10/19 0337  WBC 18.1* 16.3* 16.3* 14.7* 12.9*  NEUTROABS  --   --   --   --  10.4*  HGB 7.8* 7.6* 8.8* 9.3* 8.9*  HCT 26.1* 24.9* 28.5* 29.7* 29.0*  MCV 95.3 94.3 91.3 90.3 91.2  PLT 363 331 299 270 264   BMP &GFR Recent Labs  Lab 09/06/19 0352 09/07/19 0315 09/08/19 0315 09/09/19 0347 09/10/19 0337  NA 137 136 137 137 136  K 5.1 3.9 4.7 5.3* 4.0  CL 97* 96* 96* 95* 98  CO2 28 30 27 25 28   GLUCOSE 109* 109* 110* 89 140*  BUN 41* 18 46* 69* 27*  CREATININE 3.33* 1.72* 3.01* 3.89* 2.17*  CALCIUM 9.5 8.5* 9.1 9.5 8.6*  MG  --  1.9 2.1 2.2 1.9  PHOS 5.3* 3.4 4.5 5.7* 3.7   Estimated Creatinine Clearance: 23.8  mL/min (A) (by C-G formula based on SCr of 2.17 mg/dL (H)). Liver & Pancreas: Recent Labs  Lab 09/06/19 0352 09/07/19 0315 09/08/19 0315 09/09/19 0347 09/10/19 0337  ALBUMIN 1.7* 1.7* 1.8* 2.0* 1.7*   No results for input(s): LIPASE, AMYLASE in the last 168 hours. No results for input(s): AMMONIA  in the last 168 hours. Diabetic: No results for input(s): HGBA1C in the last 72 hours. Recent Labs  Lab 09/09/19 1945 09/09/19 2337 09/10/19 0348 09/10/19 0807 09/10/19 1157  GLUCAP 101* 111* 120* 88 105*   Cardiac Enzymes: No results for input(s): CKTOTAL, CKMB, CKMBINDEX, TROPONINI in the last 168 hours. No results for input(s): PROBNP in the last 8760 hours. Coagulation Profile: Recent Labs  Lab 09/06/19 0352 09/07/19 0315 09/08/19 0315 09/09/19 0347 09/10/19 0337  INR 1.8* 2.0* 2.4* 2.6* 2.8*   Thyroid Function Tests: No results for input(s): TSH, T4TOTAL, FREET4, T3FREE, THYROIDAB in the last 72 hours. Lipid Profile: No results for input(s): CHOL, HDL, LDLCALC, TRIG, CHOLHDL, LDLDIRECT in the last 72 hours. Anemia Panel: No results for input(s): VITAMINB12, FOLATE, FERRITIN, TIBC, IRON, RETICCTPCT in the last 72 hours. Urine analysis:    Component Value Date/Time   COLORURINE BROWN (A) 08/05/2019 2018   APPEARANCEUR TURBID (A) 08/05/2019 2018   LABSPEC RESULTS UNAVAILABLE DUE TO INTERFERING SUBSTANCE 08/05/2019 2018   PHURINE RESULTS UNAVAILABLE DUE TO INTERFERING SUBSTANCE 08/05/2019 2018   GLUCOSEU RESULTS UNAVAILABLE DUE TO INTERFERING SUBSTANCE (A) 08/05/2019 2018   HGBUR RESULTS UNAVAILABLE DUE TO INTERFERING SUBSTANCE (A) 08/05/2019 2018   BILIRUBINUR RESULTS UNAVAILABLE DUE TO INTERFERING SUBSTANCE (A) 08/05/2019 2018   KETONESUR RESULTS UNAVAILABLE DUE TO INTERFERING SUBSTANCE (A) 08/05/2019 2018   PROTEINUR RESULTS UNAVAILABLE DUE TO INTERFERING SUBSTANCE (A) 08/05/2019 2018   UROBILINOGEN 0.2 05/07/2009 1520   NITRITE RESULTS UNAVAILABLE DUE TO  INTERFERING SUBSTANCE (A) 08/05/2019 2018   LEUKOCYTESUR RESULTS UNAVAILABLE DUE TO INTERFERING SUBSTANCE (A) 08/05/2019 2018   Sepsis Labs: Invalid input(s): PROCALCITONIN, [place]  Microbiology: Recent Results (from the past 240 hour(s))  Culture, blood (routine x 2)     Status: None   Collection Time: 09/01/19  4:26 PM   Specimen: BLOOD LEFT HAND  Result Value Ref Range Status   Specimen Description BLOOD LEFT HAND  Final   Special Requests   Final    BOTTLES DRAWN AEROBIC ONLY Blood Culture adequate volume   Culture   Final    NO GROWTH 5 DAYS Performed at [facility], [address], [place], [place] [address]    Report Status 09/06/2019 FINAL  Final  Culture, blood (routine x 2)     Status: None   Collection Time: 09/01/19  4:26 PM   Specimen: BLOOD LEFT HAND  Result Value Ref Range Status   Specimen Description BLOOD LEFT HAND  Final   Special Requests   Final    BOTTLES DRAWN AEROBIC ONLY Blood Culture adequate volume   Culture   Final    NO GROWTH 5 DAYS Performed at [facility], [place] [address] [place], [place] [address]    Report Status 09/06/2019 FINAL  Final    Radiology Studies: Kub  Result Date: 09/09/2019 CLINICAL DATA:  Feeding tube clogged EXAM: PORTABLE ABDOMEN - 1 VIEW COMPARISON:  08/13/2019 FINDINGS: Feeding tube is in place. The tip is in the distal stomach or proximal duodenum. No visible kink within the visualized feeding tube. Nonobstructive bowel gas pattern. IMPRESSION: Feeding tube tip in the distal stomach or proximal duodenum. Electronically Signed   By: Rolm Baptise M.D.   On: 09/09/2019 19:52    Taye T. [place]  If 7PM-7AM, please contact night-coverage [email or web address] Password [email or web address] 09/10/2019, 2:45 PM

## 2019-09-10 NOTE — Progress Notes (Signed)
[facility] Progress Note    Assessment/ Plan:   # Dialysis dependent AKI: AKI likely ATN in the setting of shock, cardiac arrest and bacteremia.  She was on CRRT 10/30-11/1.  Kidney ultrasound showed solitary right kidney with cortical thinning and cysts.  Seen by palliative, full scope of care, potentially to LTAC?  s/p TDC with IR 11/11.   - Back on TTS schedule.  Monitor for dialysis needs.  UOP improving a bit - ? Overall clearance improving but oligoanuric; no strict ins/outs - Cont to eval for possible AVF or AVG; had previously been felt to be too debilitated.  Improving somewhat.  Note also on warfarin.  Pt's significant other hoping for [facility] as below  # CKD stage IV - baseline Cr appears near 2.5 per limited Epic data.  CKD felt 2/2 HTN  #Hyperkalemia: managed with HD  #Acute encephalopathy: improved but still present.  11/7 MRI with scattered small acute infarcts and chronic infarcts noted.  #Cardiac arrest, acute on chronic systolic CHF: EF [id number] by echo, cardiology is following.  EF improved in TEE.  #MSSA bacteremia: abx per primary team.  End date six weeks from last negative culture 08/07/19 - 09/18/19.  #Hypotension: Stable.  She has tolerated midodrine reduction to 5 mg TID.  Discontinue midodrine and follow    #Acute respiratory failure with hypoxia: HD with UF. Continue supportive care.   #Paroxysmal atrial fibrillation: Cardiology following.  On amiodarone. Anticoagulation per primary   #Anemia - multifactorial with renal failure: hgb 5.9 11/15 in setting of TDC ooze- IR has seen and stitched, Got 1 u PRBCs, on Aranesp 100 q Thursday   #Dispo: ? LTAC.  Den[facility] at [facility] though they are appealing.  Last spoke with HD coordinator/SW on 11/25.  Subjective:    Making some urine 3 voids on 11/27 and 1 + 50 mL on 11/28.  Last HD on 11/28 with 1 kg UF.  Spoke with significant other at bedside - they haven't heard anything new about decision to  appeal [facility] denial.  She's making more urine and will start to save it - still not a lot.   Review of systems   Denies shortness of breath or chest pain   Denies n/v States ate all of breakfast     Objective:   BP 121/74 (BP Location: Left Wrist)   Pulse 91   Temp (!) 97.4 F (36.3 C) (Axillary)   Resp (!) 26   Ht 5\' 6"  (1.676 m)   Wt 72 kg   SpO2 96%   BMI 25.62 kg/m   Intake/Output Summary (Last 24 hours) at 09/10/2019 1321 Last data filed at 09/10/2019 0900 Gross per 24 hour  Intake 367 ml  Output 1050 ml  Net -683 ml   Physical Exam: General: elderly female in bed  HEENT: NCAT sclera anicteric Heart: s1s2, no rub Lungs: clear to auscultation and normal work of breathing Abdomen:soft, Non-tender, non-distended Extremities: no edema lower extremities; LUE 1+ edema on pillow Dialysis Access: [facility] c/d/i Neuro - disoriented to time "2099"; she is conversant; oriented to person and location  Imaging: Kub  Result Date: 09/09/2019 CLINICAL DATA:  Feeding tube clogged EXAM: PORTABLE ABDOMEN - 1 VIEW COMPARISON:  08/13/2019 FINDINGS: Feeding tube is in place. The tip is in the distal stomach or proximal duodenum. No visible kink within the visualized feeding tube. Nonobstructive bowel gas pattern. IMPRESSION: Feeding tube tip in the distal stomach or proximal duodenum. Electronically Signed   By: Lennette Bihari  [place] M.D.   On: 09/09/2019 19:52    Labs: BMET Recent Labs  Lab 09/04/19 1200 09/05/19 0357 09/05/19 1223 09/06/19 0352 09/07/19 0315 09/08/19 0315 09/09/19 0347 09/10/19 0337  NA 136  --  139 137 136 137 137 136  K 5.6*  --  4.9 5.1 3.9 4.7 5.3* 4.0  CL 98  --  98 97* 96* 96* 95* 98  CO2 29  --  30 28 30 27 25 28   GLUCOSE 96  --  106* 109* 109* 110* 89 140*  BUN 47*  --  27* 41* 18 46* 69* 27*  CREATININE 3.81*  --  2.69* 3.33* 1.72* 3.01* 3.89* 2.17*  CALCIUM 9.2  --  9.6 9.5 8.5* 9.1 9.5 8.6*  PHOS 4.6 3.7 4.4 5.3* 3.4 4.5 5.7* 3.7   CBC Recent  Labs  Lab 09/07/19 0315 09/08/19 0315 09/09/19 0347 09/10/19 0337  WBC 16.3* 16.3* 14.7* 12.9*  NEUTROABS  --   --   --  10.4*  HGB 7.6* 8.8* 9.3* 8.9*  HCT 24.9* 28.5* 29.7* 29.0*  MCV 94.3 91.3 90.3 91.2  PLT 331 299 270 264    Medications:    .  stroke: mapping our early stages of recovery book   Does not apply Once  . sodium chloride   Intravenous Once  . amiodarone  200 mg Oral Daily  . ARIPiprazole  10 mg Oral BID  . atorvastatin  20 mg Oral Daily  . chlorhexidine  15 mL Mouth Rinse BID  . Chlorhexidine Gluconate Cloth  6 each Topical Q0600  . coumadin book   Does not apply Once  . [START ON 09/14/2019] darbepoetin (ARANESP) injection - DIALYSIS  100 mcg Intravenous Q Thu-HD  . feeding supplement  1 Container Oral TID BM  . feeding supplement (NEPRO CARB STEADY)  237 mL Oral BID BM  . feeding supplement (PRO-STAT SUGAR FREE 64)  30 mL Oral BID  . ferrous sulfate  325 mg Oral Daily  . insulin aspart  0-9 Units Subcutaneous Q4H  . lipase/protease/amylase)  20,880 Units Per Tube Once   And  . sodium bicarbonate  650 mg Per Tube Once  . mouth rinse  15 mL Mouth Rinse BID  . midodrine  5 mg Oral TID WC  . multivitamin  1 tablet Oral QHS  . nystatin  5 mL Oral QID  . pantoprazole  40 mg Oral Daily  . sodium chloride flush  10-40 mL Intracatheter Q12H  . thiamine  100 mg Oral Daily  . Thrombi-Pad  1 each Topical Once  . valproic acid  250 mg Oral Q6H  . warfarin  2 mg Oral ONCE-1800  . Warfarin - Pharmacist Dosing Inpatient   Does not apply q1800    Claudia Desanctis 09/10/2019, 1:21 PM

## 2019-09-11 DIAGNOSIS — R5381 Other malaise: Secondary | ICD-10-CM

## 2019-09-11 DIAGNOSIS — R1312 Dysphagia, oropharyngeal phase: Secondary | ICD-10-CM

## 2019-09-11 DIAGNOSIS — D638 Anemia in other chronic diseases classified elsewhere: Secondary | ICD-10-CM

## 2019-09-11 DIAGNOSIS — L89151 Pressure ulcer of sacral region, stage 1: Secondary | ICD-10-CM

## 2019-09-11 LAB — RENAL FUNCTION PANEL
Albumin: 1.8 g/dL — ABNORMAL LOW (ref 3.5–5.0)
Anion gap: 12 (ref 5–15)
BUN: 52 mg/dL — ABNORMAL HIGH (ref 8–23)
CO2: 27 mmol/L (ref 22–32)
Calcium: 9.2 mg/dL (ref 8.9–10.3)
Chloride: 98 mmol/L (ref 98–111)
Creatinine, Ser: 3.31 mg/dL — ABNORMAL HIGH (ref 0.44–1.00)
GFR calc Af Amer: 15 mL/min — ABNORMAL LOW (ref >60)
GFR calc non Af Amer: 13 mL/min — ABNORMAL LOW (ref >60)
Glucose, Bld: 113 mg/dL — ABNORMAL HIGH (ref 70–99)
Phosphorus: 5 mg/dL — ABNORMAL HIGH (ref 2.5–4.6)
Potassium: 4.6 mmol/L (ref 3.5–5.1)
Sodium: 137 mmol/L (ref 135–145)

## 2019-09-11 LAB — CBC
HCT: 27.4 % — ABNORMAL LOW (ref 36.0–46.0)
Hemoglobin: 8.5 g/dL — ABNORMAL LOW (ref 12.0–15.0)
MCH: 28.4 pg (ref 26.0–34.0)
MCHC: 31 g/dL (ref 30.0–36.0)
MCV: 91.6 fL (ref 80.0–100.0)
Platelets: 221 10*3/uL (ref 150–400)
RBC: 2.99 MIL/uL — ABNORMAL LOW (ref 3.87–5.11)
RDW: 17.2 % — ABNORMAL HIGH (ref 11.5–15.5)
WBC: 11.8 10*3/uL — ABNORMAL HIGH (ref 4.0–10.5)
nRBC: 0 % (ref 0.0–0.2)

## 2019-09-11 LAB — MAGNESIUM: Magnesium: 2 mg/dL (ref 1.7–2.4)

## 2019-09-11 LAB — GLUCOSE, CAPILLARY
Glucose-Capillary: 100 mg/dL — ABNORMAL HIGH (ref 70–99)
Glucose-Capillary: 106 mg/dL — ABNORMAL HIGH (ref 70–99)
Glucose-Capillary: 110 mg/dL — ABNORMAL HIGH (ref 70–99)
Glucose-Capillary: 114 mg/dL — ABNORMAL HIGH (ref 70–99)
Glucose-Capillary: 118 mg/dL — ABNORMAL HIGH (ref 70–99)
Glucose-Capillary: 119 mg/dL — ABNORMAL HIGH (ref 70–99)
Glucose-Capillary: 124 mg/dL — ABNORMAL HIGH (ref 70–99)

## 2019-09-11 LAB — PROTIME-INR
INR: 3.1 — ABNORMAL HIGH (ref 0.8–1.2)
Prothrombin Time: 31.7 seconds — ABNORMAL HIGH (ref 11.4–15.2)

## 2019-09-11 MED ORDER — VALPROIC ACID 250 MG PO CAPS
250.0000 mg | ORAL_CAPSULE | Freq: Four times a day (QID) | ORAL | Status: DC
  Administered 2019-09-11 – 2019-09-24 (×47): 250 mg via ORAL
  Filled 2019-09-11 (×56): qty 1
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
## 2019-09-11 NOTE — Plan of Care (Signed)
  Problem: Nutrition: Goal: Adequate nutrition will be maintained Outcome: Progressing  Continues to improve oral intake w/each meal. Calorie count in progress. Pt continues to receive nocturnal tube feedings. Problem: Coping: Goal: Level of anxiety will decrease Outcome: Completed/Met   Problem: Pain Managment: Goal: General experience of comfort will improve Outcome: Completed/Met   Problem: Health Behavior/Discharge Planning: Goal: Ability to manage health-related needs will improve Outcome: Not Applicable Pt will require long term care when ready for discharge. Pt unable to care for self.

## 2019-09-11 NOTE — TOC Progression Note (Signed)
Transition of Care [facility]) - Progression Note    Patient Details  Name: Jaclyn Day MRN: [id number] Date of Birth: 21-Jun-1946  Transition of Care [facility]) CM/SW Contact  Graves-Bigelow, Ocie Cornfield, RN Phone Number: 09/11/2019, 11:55 AM  Clinical Narrative: Patient was declined by [facility] secondary to the patient's care needs exceeds the facilities capabilities. Patient was also declined by [facility]- due to facility not having a bed available for HD patients.   Expected Discharge Plan: Long Term Acute Care (LTAC) Barriers to Discharge: Continued Medical Work up  Expected Discharge Plan and Services Expected Discharge Plan: [place] (LTAC) In-house Referral: NA Discharge Planning Services: CM Consult Post Acute Care Choice: [place] arrangements for the past 2 months: Single Family Home                  Readmission Risk Interventions Readmission Risk Prevention Plan 08/08/2019  Transportation Screening Complete  PCP or Specialist Appt within 3-5 Days Complete  HRI or Home Care Consult Complete  Medication Review (RN Care Manager) Complete  Some recent data might be hidden

## 2019-09-11 NOTE — Progress Notes (Signed)
Patient ID: Jaclyn Day, female   DOB: 15-Oct-1945, 73 y.o.   MRN: [id number] S: Eating breakfast, no complaints O:BP 128/83   Pulse (!) 103   Temp 98.4 F (36.9 C) (Oral)   Resp 18   Ht 5\' 6"  (1.676 m)   Wt 72.9 kg   SpO2 96%   BMI 25.94 kg/m   Intake/Output Summary (Last 24 hours) at 09/11/2019 1939 Last data filed at 09/11/2019 1700 Gross per 24 hour  Intake 400 ml  Output 600 ml  Net -200 ml   Intake/Output: I/O last 3 completed shifts: In: 930 [P.O.:570; I.V.:20; NG/GT:240; IV Piggyback:100] Out: 600 [Urine:600]  Intake/Output this shift:  No intake/output data recorded. Weight change: 0.9 kg Gen: chronically ill-appearing WF in NAD CVS: no rub Resp:cta Abd:+BS, soft, NT/ND Ext: no edema  Recent Labs  Lab 09/05/19 1223 09/06/19 0352 09/07/19 0315 09/08/19 0315 09/09/19 0347 09/10/19 0337 09/11/19 0429  NA 139 137 136 137 137 136 137  K 4.9 5.1 3.9 4.7 5.3* 4.0 4.6  CL 98 97* 96* 96* 95* 98 98  CO2 30 28 30 27 25 28 27   GLUCOSE 106* 109* 109* 110* 89 140* 113*  BUN 27* 41* 18 46* 69* 27* 52*  CREATININE 2.69* 3.33* 1.72* 3.01* 3.89* 2.17* 3.31*  ALBUMIN 1.8* 1.7* 1.7* 1.8* 2.0* 1.7* 1.8*  CALCIUM 9.6 9.5 8.5* 9.1 9.5 8.6* 9.2  PHOS 4.4 5.3* 3.4 4.5 5.7* 3.7 5.0*   Liver Function Tests: Recent Labs  Lab 09/09/19 0347 09/10/19 0337 09/11/19 0429  ALBUMIN 2.0* 1.7* 1.8*   No results for input(s): LIPASE, AMYLASE in the last 168 hours. No results for input(s): AMMONIA in the last 168 hours. CBC: Recent Labs  Lab 09/07/19 0315 09/08/19 0315 09/09/19 0347 09/10/19 0337 09/11/19 0429  WBC 16.3* 16.3* 14.7* 12.9* 11.8*  NEUTROABS  --   --   --  10.4*  --   HGB 7.6* 8.8* 9.3* 8.9* 8.5*  HCT 24.9* 28.5* 29.7* 29.0* 27.4*  MCV 94.3 91.3 90.3 91.2 91.6  PLT 331 299 270 264 221   Cardiac Enzymes: No results for input(s): CKTOTAL, CKMB, CKMBINDEX, TROPONINI in the last 168 hours. CBG: Recent Labs  Lab 09/11/19 0012 09/11/19 0416  09/11/19 0743 09/11/19 1107 09/11/19 1607  GLUCAP 110* 106* 100* 124* 118*    Iron Studies: No results for input(s): IRON, TIBC, TRANSFERRIN, FERRITIN in the last 72 hours. Studies/Results: No results found. .  stroke: mapping our early stages of recovery book   Does not apply Once  . sodium chloride   Intravenous Once  . amiodarone  200 mg Oral Daily  . ARIPiprazole  10 mg Oral BID  . atorvastatin  20 mg Oral Daily  . chlorhexidine  15 mL Mouth Rinse BID  . Chlorhexidine Gluconate Cloth  6 each Topical Q0600  . coumadin book   Does not apply Once  . [START ON 09/14/2019] darbepoetin (ARANESP) injection - DIALYSIS  100 mcg Intravenous Q Thu-HD  . feeding supplement  1 Container Oral TID BM  . feeding supplement (NEPRO CARB STEADY)  237 mL Oral BID BM  . feeding supplement (PRO-STAT SUGAR FREE 64)  30 mL Oral BID  . ferrous sulfate  325 mg Oral Daily  . insulin aspart  0-9 Units Subcutaneous Q4H  . lipase/protease/amylase)  20,880 Units Per Tube Once   And  . sodium bicarbonate  650 mg Per Tube Once  . mouth rinse  15 mL Mouth Rinse BID  .  multivitamin  1 tablet Oral QHS  . nystatin  5 mL Oral QID  . pantoprazole  40 mg Oral Daily  . sodium chloride flush  10-40 mL Intracatheter Q12H  . thiamine  100 mg Oral Daily  . Thrombi-Pad  1 each Topical Once  . valproic acid  250 mg Oral Q6H  . Warfarin - Pharmacist Dosing Inpatient   Does not apply q1800    BMET    Component Value Date/Time   NA 137 09/11/2019 0429   K 4.6 09/11/2019 0429   CL 98 09/11/2019 0429   CO2 27 09/11/2019 0429   GLUCOSE 113 (H) 09/11/2019 0429   BUN 52 (H) 09/11/2019 0429   CREATININE 3.31 (H) 09/11/2019 0429   CALCIUM 9.2 09/11/2019 0429   GFRNONAA 13 (L) 09/11/2019 0429   GFRAA 15 (L) 09/11/2019 0429   CBC    Component Value Date/Time   WBC 11.8 (H) 09/11/2019 0429   RBC 2.99 (L) 09/11/2019 0429   HGB 8.5 (L) 09/11/2019 0429   HCT 27.4 (L) 09/11/2019 0429   HCT 21.4 (L) 08/21/2019 1040    PLT 221 09/11/2019 0429   MCV 91.6 09/11/2019 0429   MCH 28.4 09/11/2019 0429   MCHC 31.0 09/11/2019 0429   RDW 17.2 (H) 09/11/2019 0429   LYMPHSABS 1.1 09/10/2019 0337   MONOABS 1.0 09/10/2019 0337   EOSABS 0.0 09/10/2019 0337   BASOSABS 0.1 09/10/2019 0337     Assessment/Plan:  1. AKI/CKD stage 4- in setting of cardiac arrest and bacteremia.  Has been dialysis dependent since she started CRRT on 10/30 and has been maintained on IHD on TTS schedule.  Palliative care consult however plan is for full scope of care. 2. Vascular access- s/p [facility] TDC on 08/23/19 by IR.  Was turned down for avf/avg in past due to debilitated state. 3. MSSA bacteremia- Ancepf through 09/18/19. 4. S/p cardiac arrest- h/o cardiomyopathy with EF 20%.  Per cardiology 5. Anemia of CKD- on aranesp 100 mcg every Thursday.  6. Paroxysmal atrial fibrillation- on amiodarone and anticoagulation per cardiology 7. Protein malnutrition- on tube feeding 8. Dysphagia- presumably due to multiple infarcts possibly embolic. 9. Disposition- denied [facility].  Awaiting LTC placement.   Donetta Potts, MD [employer] [phone]
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
## 2019-09-11 NOTE — Progress Notes (Signed)
Patient refused bipap for the evening 

## 2019-09-11 NOTE — TOC Progression Note (Signed)
Transition of Care [facility]) - Progression Note    Patient Details  Name: DELAYNE JOY MRN: [id number] Date of Birth: August 24, 1946  [facility]) CM/SW Contact  Eileen Stanford, LCSW Phone Number: 09/11/2019, 10:31 AM  Clinical Narrative:   [facility]AS[facility] received, authorization for SNF received. However, pt can not go to SNF with Cortrak.    Expected Discharge Plan: Long Term Acute Care (LTAC) Barriers to Discharge: Continued Medical Work up  Expected Discharge Plan and Services Expected Discharge Plan: [place] (LTAC) In-house Referral: NA Discharge Planning Services: CM Consult Post Acute Care Choice: [place] Living arrangements for the past 2 months: Single Family Home                                       Social Determinants of Health (SDOH) Interventions    Readmission Risk Interventions Readmission Risk Prevention Plan 08/08/2019  Transportation Screening Complete  PCP or Specialist Appt within 3-5 Days Complete  HRI or Home Care Consult Complete  Medication Review (RN Care Manager) Complete  Some recent data might be hidden

## 2019-09-11 NOTE — Progress Notes (Signed)
PROGRESS NOTE  Jaclyn Day [id number] DOB: 1946/04/30   PCP: Cyndi Bender, PA-C  Patient is from: Home  DOA: 08/03/2019 LOS: 76  Brief Narrative / Interim history: 73 year old female with history of congenital unilateral kidney, CKD-4, HTN, persistent A. fib on Eliquis and diastolic CHF admitted to ICU 08/03/2023 cardiac arrest.  Found to have new systolic CHF with EF of 20 to 25%, global hypokinesis.  Blood culture grew MSSA for which she was started on vancomycin and transition to Ancef on 08/06/2019.  Patient had echocardiogram on 08/21/2019 that showed EF of 55 to 60%, moderate LAE and RVSP to 39.  Hospital course complicated by A. fib with RVR, acute metabolic encephalopathy/agitation and AKI.  She is a still with cortrack for feeding.  Subjective: No major events overnight of this morning.  No complaint this morning.  She denies chest pain, dyspnea or GI symptoms.  Asking for assistance with her breakfast.  Objective: Vitals:   09/11/19 0115 09/11/19 0417 09/11/19 0746 09/11/19 1128  BP: 133/72 118/70 129/84 127/81  Pulse: (!) 102 82 88 90  Resp: (!) 24 18    Temp:  98 F (36.7 C) 98 F (36.7 C) 97.7 F (36.5 C)  TempSrc:  Axillary Oral Oral  SpO2: 93% 94%  95%  Weight:  72.9 kg    Height:        Intake/Output Summary (Last 24 hours) at 09/11/2019 1424 Last data filed at 09/11/2019 0300 Gross per 24 hour  Intake 550 ml  Output -  Net 550 ml   Filed Weights   09/09/19 0431 09/10/19 0550 09/11/19 0417  Weight: 73 kg 72 kg 72.9 kg    Examination:  GENERAL: No acute distress.  Resting comfortably. HEENT: MMM.  Vision and hearing grossly intact.  Cortrak in place NECK: Supple.  No apparent JVD.  RESP:  No IWOB. Good air movement bilaterally. CVS:  RRR. Heart sounds normal.  ABD/GI/GU: Bowel sounds present. Soft. Non tender.  MSK/EXT:  Moves extremities. No apparent deformity or edema.  SKIN: Reportedly stage I sacral skin injury. NEURO: Awake,  alert and oriented appropriately.  Cranial nerves grossly intact.  Motor 3+/5 in RUE, 3/5 in LUE and LLE, and 2/5 in RLE. PSYCH: Calm. Normal affect.  Assessment & Plan: Acute respiratory failure with hypoxia "multifactorial including cardiac arrest, pulmonary edema, bilateral pleural effusion and anxiety.  Resolved.  She is now on room air. -treat treatable causes.  MSSA bacteremia/AV endocarditis: Bandemia improving.  Remains afebrile.  Leukocytosis improving. -Continue Ancef until 09/18/2019 per ID-total of 6 weeks  Leukocytosis/bandemia: Likely due to the above.  Improving. -Continue trending.  V. tach/cardiac arrest/acute systolic CHF: Initial TTE on 10/23 with EF of 20 to 25%.  Repeat TTE on 11/9 with EF of 55 to 60%.  Currently euvolemic.  -Fluid management with dialysis--1.5 L with HD 11/25. -S/p ETT and hypothermia protocol in ICU  Acute metabolic encephalopathy: Resolved.  She is oriented x4 except year but limited insight -Frequent reorientation and delirium precautions.  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  Patient with significant upper and lower extremity weakness left greater than right mainly in upper extremities. -Continue warfarin per pharmacy   Persistent A. fib: rate and rhythm controlled. -Continue p.o. amiodarone and warfarin  Anemia: Multifactorial-acute blood loss from HD cath 11/15, chronic disease and malnutrition. -Hgb 11.6 (admit and baseline)>> 6.9 (11/10)>1u> 8.2>> 5.9 (11/16)> 8.8>> 7.6>1u>8.8>> 8.5 -On Aranesp and iron per nephro -Optimize nutrition -  Monitor H&H  CKD-4: Likely hemodynamically mediated in the setting of cardiac issues and bacteremia -CRRT 10/30-11/1>>IHD  -Continue monitoring  Metabolic acidosis/bone mineral disorder/hyperkalemia: Likely due to renal failure. -Per nephrology.  Hypotension: Normotensive. -Continue midodrine.  Dysphagia: SLP and dietitian  following.   -Continue tube feed per Cortrack -Dysphagia 1 diet per SLP-continue calorie count  Oral thrush -Continue nystatin  Vitamin D deficiency -On weekly vitamin D 50,000 units  Stage I sacral decubitus ulcer: Not present on admission -Dressing and offloading per RN  Moderate malnutrition: Nutrition Problem: Moderate Malnutrition Etiology: acute illness(acute respiratory failure, AKI required CRRT, endocarditis)  Signs/Symptoms: mild fat depletion, mild muscle depletion, moderate muscle depletion  Interventions: Tube feeding   DVT prophylaxis: Subcu heparin Code Status: Full code Family Communication: Patient and/or RN. Available if any question. Disposition Plan: Remains inpatient Consultants: Cardiology (signed off), neurology (signed off), IR (signed off), palliative, nephrology  Procedures:  HD cath placement CRRT IHD  Microbiology summarized: 10/22-COVID-19 negative 10/22-MRSA PCR negative 10/22-respiratory viral panel negative 10/22-urine culture with E. Coli 10/22-blood culture with MSSA in 1/2 bottles 10/26, 10/29, 11/16 and 11/20-blood cultures negative  Sch Meds:  Scheduled Meds: .  stroke: mapping our early stages of recovery book   Does not apply Once  . sodium chloride   Intravenous Once  . amiodarone  200 mg Oral Daily  . ARIPiprazole  10 mg Oral BID  . atorvastatin  20 mg Oral Daily  . chlorhexidine  15 mL Mouth Rinse BID  . Chlorhexidine Gluconate Cloth  6 each Topical Q0600  . coumadin book   Does not apply Once  . [START ON 09/14/2019] darbepoetin (ARANESP) injection - DIALYSIS  100 mcg Intravenous Q Thu-HD  . feeding supplement  1 Container Oral TID BM  . feeding supplement (NEPRO CARB STEADY)  237 mL Oral BID BM  . feeding supplement (PRO-STAT SUGAR FREE 64)  30 mL Oral BID  . ferrous sulfate  325 mg Oral Daily  . insulin aspart  0-9 Units Subcutaneous Q4H  . lipase/protease/amylase)  20,880 Units Per Tube Once   And  . sodium  bicarbonate  650 mg Per Tube Once  . mouth rinse  15 mL Mouth Rinse BID  . multivitamin  1 tablet Oral QHS  . nystatin  5 mL Oral QID  . pantoprazole  40 mg Oral Daily  . sodium chloride flush  10-40 mL Intracatheter Q12H  . thiamine  100 mg Oral Daily  . Thrombi-Pad  1 each Topical Once  . valproic acid  250 mg Oral Q6H  . Warfarin - Pharmacist Dosing Inpatient   Does not apply q1800   Continuous Infusions: . sodium chloride 10 mL (08/20/19 2025)  . sodium chloride    . sodium chloride    .  ceFAZolin (ANCEF) IV 2 g (09/09/19 1844)  . feeding supplement (OSMOLITE 1.5 CAL) 1,000 mL (09/10/19 2100)   PRN Meds:.sodium chloride, sodium chloride, acetaminophen, alteplase, heparin, hydrALAZINE, HYDROmorphone (DILAUDID) injection, levalbuterol, lidocaine (PF), lidocaine-prilocaine, pentafluoroprop-tetrafluoroeth, sodium chloride flush  Antimicrobials: Anti-infectives (From admission, onward)   Start     Dose/Rate Route Frequency Ordered Stop   09/09/19 1800  ceFAZolin (ANCEF) IVPB 2g/100 mL premix     2 g 200 mL/hr over 30 Minutes Intravenous Every T-Th-Sa (1800) 09/05/19 1024     09/04/19 1800  ceFAZolin (ANCEF) IVPB 2g/100 mL premix     2 g 200 mL/hr over 30 Minutes Intravenous Every M-W-F (1800) 09/04/19 1335 09/06/19 1736   08/31/19 1800  ceFAZolin (ANCEF) IVPB 2g/100 mL premix  Status:  Discontinued     2 g 200 mL/hr over 30 Minutes Intravenous Every T-Th-Sa (1800) 08/31/19 1401 09/04/19 1335   08/30/19 0900  ceFAZolin (ANCEF) IVPB 2g/100 mL premix     2 g 200 mL/hr over 30 Minutes Intravenous  Once 08/30/19 0854 08/30/19 1134   08/24/19 1200  ceFAZolin (ANCEF) IVPB 2g/100 mL premix  Status:  Discontinued     2 g 200 mL/hr over 30 Minutes Intravenous Every T-Th-Sa (Hemodialysis) 08/23/19 0807 08/31/19 1401   08/20/19 2200  ceFAZolin (ANCEF) IVPB 1 g/50 mL premix  Status:  Discontinued     1 g 100 mL/hr over 30 Minutes Intravenous Daily at bedtime 08/20/19 1507 08/23/19 0807    08/15/19 2200  ceFAZolin (ANCEF) IVPB 2g/100 mL premix  Status:  Discontinued     2 g 200 mL/hr over 30 Minutes Intravenous Daily at bedtime 08/15/19 1133 08/20/19 1507   08/11/19 1800  ceFAZolin (ANCEF) IVPB 2g/100 mL premix  Status:  Discontinued     2 g 200 mL/hr over 30 Minutes Intravenous Every 12 hours 08/11/19 1110 08/15/19 1133   08/10/19 1600  ceFAZolin (ANCEF) IVPB 1 g/50 mL premix  Status:  Discontinued     1 g 100 mL/hr over 30 Minutes Intravenous Every 12 hours 08/10/19 1530 08/11/19 1110   08/06/19 1800  ceFAZolin (ANCEF) IVPB 1 g/50 mL premix  Status:  Discontinued     1 g 100 mL/hr over 30 Minutes Intravenous Every 24 hours 08/06/19 1025 08/10/19 1530   08/05/19 1530  vancomycin (VANCOCIN) 1,750 mg in sodium chloride 0.9 % 500 mL IVPB     1,750 mg 250 mL/hr over 120 Minutes Intravenous  Once 08/05/19 1444 08/05/19 1736   08/05/19 1443  vancomycin variable dose per unstable renal function (pharmacist dosing)  Status:  Discontinued      Does not apply See admin instructions 08/05/19 1444 08/06/19 1025   08/03/19 1300  cefTRIAXone (ROCEPHIN) 2 g in sodium chloride 0.9 % 100 mL IVPB  Status:  Discontinued     2 g 200 mL/hr over 30 Minutes Intravenous Every 24 hours 08/03/19 1251 08/06/19 1025       I have personally reviewed the following labs and images: CBC: Recent Labs  Lab 09/07/19 0315 09/08/19 0315 09/09/19 0347 09/10/19 0337 09/11/19 0429  WBC 16.3* 16.3* 14.7* 12.9* 11.8*  NEUTROABS  --   --   --  10.4*  --   HGB 7.6* 8.8* 9.3* 8.9* 8.5*  HCT 24.9* 28.5* 29.7* 29.0* 27.4*  MCV 94.3 91.3 90.3 91.2 91.6  PLT 331 299 270 264 221   BMP &GFR Recent Labs  Lab 09/07/19 0315 09/08/19 0315 09/09/19 0347 09/10/19 0337 09/11/19 0429  NA 136 137 137 136 137  K 3.9 4.7 5.3* 4.0 4.6  CL 96* 96* 95* 98 98  CO2 30 27 25 28 27   GLUCOSE 109* 110* 89 140* 113*  BUN 18 46* 69* 27* 52*  CREATININE 1.72* 3.01* 3.89* 2.17* 3.31*  CALCIUM 8.5* 9.1 9.5 8.6* 9.2  MG  1.9 2.1 2.2 1.9 2.0  PHOS 3.4 4.5 5.7* 3.7 5.0*   Estimated Creatinine Clearance: 15.7 mL/min (A) (by C-G formula based on SCr of 3.31 mg/dL (H)). Liver & Pancreas: Recent Labs  Lab 09/07/19 0315 09/08/19 0315 09/09/19 0347 09/10/19 0337 09/11/19 0429  ALBUMIN 1.7* 1.8* 2.0* 1.7* 1.8*   No results for input(s): LIPASE, AMYLASE in the last 168 hours. No results  for input(s): AMMONIA in the last 168 hours. Diabetic: No results for input(s): HGBA1C in the last 72 hours. Recent Labs  Lab 09/10/19 1957 09/11/19 0012 09/11/19 0416 09/11/19 0743 09/11/19 1107  GLUCAP 125* 110* 106* 100* 124*   Cardiac Enzymes: No results for input(s): CKTOTAL, CKMB, CKMBINDEX, TROPONINI in the last 168 hours. No results for input(s): PROBNP in the last 8760 hours. Coagulation Profile: Recent Labs  Lab 09/07/19 0315 09/08/19 0315 09/09/19 0347 09/10/19 0337 09/11/19 0429  INR 2.0* 2.4* 2.6* 2.8* 3.1*   Thyroid Function Tests: No results for input(s): TSH, T4TOTAL, FREET4, T3FREE, THYROIDAB in the last 72 hours. Lipid Profile: No results for input(s): CHOL, HDL, LDLCALC, TRIG, CHOLHDL, LDLDIRECT in the last 72 hours. Anemia Panel: No results for input(s): VITAMINB12, FOLATE, FERRITIN, TIBC, IRON, RETICCTPCT in the last 72 hours. Urine analysis:    Component Value Date/Time   COLORURINE BROWN (A) 08/05/2019 2018   APPEARANCEUR TURBID (A) 08/05/2019 2018   LABSPEC RESULTS UNAVAILABLE DUE TO INTERFERING SUBSTANCE 08/05/2019 2018   PHURINE RESULTS UNAVAILABLE DUE TO INTERFERING SUBSTANCE 08/05/2019 2018   GLUCOSEU RESULTS UNAVAILABLE DUE TO INTERFERING SUBSTANCE (A) 08/05/2019 2018   HGBUR RESULTS UNAVAILABLE DUE TO INTERFERING SUBSTANCE (A) 08/05/2019 2018   BILIRUBINUR RESULTS UNAVAILABLE DUE TO INTERFERING SUBSTANCE (A) 08/05/2019 2018   KETONESUR RESULTS UNAVAILABLE DUE TO INTERFERING SUBSTANCE (A) 08/05/2019 2018   PROTEINUR RESULTS UNAVAILABLE DUE TO INTERFERING SUBSTANCE (A)  08/05/2019 2018   UROBILINOGEN 0.2 05/07/2009 1520   NITRITE RESULTS UNAVAILABLE DUE TO INTERFERING SUBSTANCE (A) 08/05/2019 2018   LEUKOCYTESUR RESULTS UNAVAILABLE DUE TO INTERFERING SUBSTANCE (A) 08/05/2019 2018   Sepsis Labs: Invalid input(s): PROCALCITONIN, [place]  Microbiology: Recent Results (from the past 240 hour(s))  Culture, blood (routine x 2)     Status: None   Collection Time: 09/01/19  4:26 PM   Specimen: BLOOD LEFT HAND  Result Value Ref Range Status   Specimen Description BLOOD LEFT HAND  Final   Special Requests   Final    BOTTLES DRAWN AEROBIC ONLY Blood Culture adequate volume   Culture   Final    NO GROWTH 5 DAYS Performed at [facility], [address] [place], [place] [address]    Report Status 09/06/2019 FINAL  Final  Culture, blood (routine x 2)     Status: None   Collection Time: 09/01/19  4:26 PM   Specimen: BLOOD LEFT HAND  Result Value Ref Range Status   Specimen Description BLOOD LEFT HAND  Final   Special Requests   Final    BOTTLES DRAWN AEROBIC ONLY Blood Culture adequate volume   Culture   Final    NO GROWTH 5 DAYS Performed at [facility], [place] [address], [place], [place] [address]    Report Status 09/06/2019 FINAL  Final    Radiology Studies: No results found.  Tyana Butzer T. Atkinson  If 7PM-7AM, please contact night-coverage [email or web address] Password [email or web address] 09/11/2019, 2:24 PM

## 2019-09-11 NOTE — Progress Notes (Signed)
ANTICOAGULATION CONSULT NOTE - Follow-Up Consult  Pharmacy Consult for Warfarin Indication: atrial fibrillation  Allergies  Allergen Reactions  . Codeine Other (See Comments)    Reaction not recalled by family- was told to never take this    Patient Measurements: Height: 5\' 6"  (167.6 cm) Weight: 160 lb 11.5 oz (72.9 kg) IBW/kg (Calculated) : 59.3  Vital Signs: Temp: 98 F (36.7 C) (11/30 0746) Temp Source: Oral (11/30 0746) BP: 129/84 (11/30 0746) Pulse Rate: 88 (11/30 0746)  Labs: Recent Labs    09/09/19 0347 09/10/19 0337 09/11/19 0429  HGB 9.3* 8.9* 8.5*  HCT 29.7* 29.0* 27.4*  PLT 270 264 221  LABPROT 27.8* 29.4* 31.7*  INR 2.6* 2.8* 3.1*  CREATININE 3.89* 2.17* 3.31*    Estimated Creatinine Clearance: 15.7 mL/min (A) (by C-G formula based on SCr of 3.31 mg/dL (H)).   Medical History: Past Medical History:  Diagnosis Date  . Anemia   . Chronic diastolic CHF (congestive heart failure) ([place])   . CKD (chronic kidney disease), stage IV ([place])   . Hypertension   . Persistent atrial fibrillation ([place])    a. dx 01/2019 s/p TEE DCCV.  Marland Kitchen Renal disorder   . Unilateral congenital absence of kidney    Assessment: Pt is a 73 yr old female admitted s/p out of hospital cardiac arrest and TTM. Admit c/b by encephalopathy, AKI which progressed to HD, and ?embolic CVA. Pt on apixaban PTA for Afib, which was held for IV heparin. Heparin subsequently held on 11/16 due to bleeding from HD lines and low Hgb. Warfarin started 11/18 with no bridge planned. Patient on amiodarone PO  -INR= 3.1 with INR trend up  Goal of Therapy:  INR 2-3 Monitor platelets by anticoagulation protocol: Yes   Plan:  Hold warfarin today Daily INR  Hildred Laser, PharmD Clinical Pharmacist **Pharmacist phone directory can now be found on [email or web address] (PW [email or web address]).  Listed under [place].

## 2019-09-12 LAB — PROTIME-INR
INR: 2.6 — ABNORMAL HIGH (ref 0.8–1.2)
Prothrombin Time: 28.1 seconds — ABNORMAL HIGH (ref 11.4–15.2)

## 2019-09-12 LAB — GLUCOSE, CAPILLARY
Glucose-Capillary: 99 mg/dL (ref 70–99)
Glucose-Capillary: 84 mg/dL (ref 70–99)
Glucose-Capillary: 163 mg/dL — ABNORMAL HIGH (ref 70–99)
Glucose-Capillary: 109 mg/dL — ABNORMAL HIGH (ref 70–99)
Glucose-Capillary: 107 mg/dL — ABNORMAL HIGH (ref 70–99)

## 2019-09-12 LAB — MRSA PCR SCREENING: MRSA by PCR: NEGATIVE

## 2019-09-12 MED ORDER — WARFARIN SODIUM 2 MG PO TABS
2.0000 mg | ORAL_TABLET | Freq: Once | ORAL | Status: AC
  Administered 2019-09-12: 19:00:00 2 mg via ORAL
  Filled 2019-09-12: qty 1

## 2019-09-12 NOTE — Progress Notes (Signed)
ANTICOAGULATION CONSULT NOTE - Follow-Up Consult  Pharmacy Consult for Warfarin Indication: atrial fibrillation  Allergies  Allergen Reactions  . Codeine Other (See Comments)    Reaction not recalled by family- was told to never take this    Patient Measurements: Height: 5\' 6"  (167.6 cm) Weight: 170 lb 6.7 oz (77.3 kg) IBW/kg (Calculated) : 59.3  Vital Signs: Temp: 98 F (36.7 C) (12/01 0410) Temp Source: Oral (12/01 0410) BP: 135/76 (12/01 0815) Pulse Rate: 93 (12/01 0815)  Labs: Recent Labs    09/10/19 0337 09/11/19 0429 09/12/19 0442  HGB 8.9* 8.5*  --   HCT 29.0* 27.4*  --   PLT 264 221  --   LABPROT 29.4* 31.7* 28.1*  INR 2.8* 3.1* 2.6*  CREATININE 2.17* 3.31*  --     Estimated Creatinine Clearance: 16.1 mL/min (A) (by C-G formula based on SCr of 3.31 mg/dL (H)).   Medical History: Past Medical History:  Diagnosis Date  . Anemia   . Chronic diastolic CHF (congestive heart failure) ([place])   . CKD (chronic kidney disease), stage IV ([place])   . Hypertension   . Persistent atrial fibrillation ([place])    a. dx 01/2019 s/p TEE DCCV.  Marland Kitchen Renal disorder   . Unilateral congenital absence of kidney    Assessment: Pt is a 73 yr old female admitted s/p out of hospital cardiac arrest and TTM. Admit c/b by encephalopathy, AKI which progressed to HD, and ?embolic CVA. Pt on apixaban PTA for Afib, which was held for IV heparin. Heparin subsequently held on 11/16 due to bleeding from HD lines and low Hgb. Warfarin started 11/18 with no bridge planned. Patient on amiodarone PO   INR today came back therapeutic at 2.6 (after holding dose on 11/30). CBC stable on last check 11/30. No s/sx of bleeding. Oral intake not documented on 11/30 but previously 50-90%.   Goal of Therapy:  INR 2-3 Monitor platelets by anticoagulation protocol: Yes   Plan:  Warfarin 2 mg tonight Daily INR  Antonietta Jewel, PharmD, BCCCP Clinical Pharmacist  Phone: [phone]  Please check AMION for all  [place] phone numbers After 10:00 PM, call [place] [phone]

## 2019-09-12 NOTE — Care Management Important Message (Signed)
Important Message  Patient Details  Name: LARESSA LARCOM MRN: [id number] Date of Birth: 07-25-1946   Medicare Important Message Given:  Yes     Shelda Altes 09/12/2019, 10:59 AM

## 2019-09-12 NOTE — Progress Notes (Signed)
Informed floor Rn Jim [facility] HD Tx rescheduled to12/2/20 per Dr. Coladonato.  

## 2019-09-12 NOTE — Progress Notes (Signed)
Patient ID: Jaclyn Day, female   DOB: Aug 20, 1946, 73 y.o.   MRN: [id number] S: no events overnight O:BP 131/88   Pulse 94   Temp 98 F (36.7 C) (Oral)   Resp 17   Ht 5\' 6"  (1.676 m)   Wt 77.3 kg   SpO2 96%   BMI 27.51 kg/m   Intake/Output Summary (Last 24 hours) at 09/12/2019 1420 Last data filed at 09/11/2019 2136 Gross per 24 hour  Intake 120 ml  Output 600 ml  Net -480 ml   Intake/Output: I/O last 3 completed shifts: In: 520 [P.O.:240; NG/GT:180; IV Piggyback:100] Out: 600 [Urine:600]  Intake/Output this shift:  No intake/output data recorded. Weight change: 4.4 kg Gen: NAD, slurred speech and slowed mentation CVS: no rub Resp: cta Abd: +BS, soft, NT Ext: + edema of left arm and pretibial edema  Recent Labs  Lab 09/06/19 0352 09/07/19 0315 09/08/19 0315 09/09/19 0347 09/10/19 0337 09/11/19 0429  NA 137 136 137 137 136 137  K 5.1 3.9 4.7 5.3* 4.0 4.6  CL 97* 96* 96* 95* 98 98  CO2 28 30 27 25 28 27   GLUCOSE 109* 109* 110* 89 140* 113*  BUN 41* 18 46* 69* 27* 52*  CREATININE 3.33* 1.72* 3.01* 3.89* 2.17* 3.31*  ALBUMIN 1.7* 1.7* 1.8* 2.0* 1.7* 1.8*  CALCIUM 9.5 8.5* 9.1 9.5 8.6* 9.2  PHOS 5.3* 3.4 4.5 5.7* 3.7 5.0*   Liver Function Tests: Recent Labs  Lab 09/09/19 0347 09/10/19 0337 09/11/19 0429  ALBUMIN 2.0* 1.7* 1.8*   No results for input(s): LIPASE, AMYLASE in the last 168 hours. No results for input(s): AMMONIA in the last 168 hours. CBC: Recent Labs  Lab 09/07/19 0315 09/08/19 0315 09/09/19 0347 09/10/19 0337 09/11/19 0429  WBC 16.3* 16.3* 14.7* 12.9* 11.8*  NEUTROABS  --   --   --  10.4*  --   HGB 7.6* 8.8* 9.3* 8.9* 8.5*  HCT 24.9* 28.5* 29.7* 29.0* 27.4*  MCV 94.3 91.3 90.3 91.2 91.6  PLT 331 299 270 264 221   Cardiac Enzymes: No results for input(s): CKTOTAL, CKMB, CKMBINDEX, TROPONINI in the last 168 hours. CBG: Recent Labs  Lab 09/11/19 1955 09/11/19 2349 09/12/19 0412 09/12/19 0812 09/12/19 1141  GLUCAP 114* 119*  99 84 163*    Iron Studies: No results for input(s): IRON, TIBC, TRANSFERRIN, FERRITIN in the last 72 hours. Studies/Results: No results found. .  stroke: mapping our early stages of recovery book   Does not apply Once  . sodium chloride   Intravenous Once  . amiodarone  200 mg Oral Daily  . ARIPiprazole  10 mg Oral BID  . atorvastatin  20 mg Oral Daily  . chlorhexidine  15 mL Mouth Rinse BID  . Chlorhexidine Gluconate Cloth  6 each Topical Q0600  . coumadin book   Does not apply Once  . [START ON 09/14/2019] darbepoetin (ARANESP) injection - DIALYSIS  100 mcg Intravenous Q Thu-HD  . feeding supplement  1 Container Oral TID BM  . feeding supplement (NEPRO CARB STEADY)  237 mL Oral BID BM  . feeding supplement (PRO-STAT SUGAR FREE 64)  30 mL Oral BID  . ferrous sulfate  325 mg Oral Daily  . insulin aspart  0-9 Units Subcutaneous Q4H  . lipase/protease/amylase)  20,880 Units Per Tube Once   And  . sodium bicarbonate  650 mg Per Tube Once  . mouth rinse  15 mL Mouth Rinse BID  . multivitamin  1 tablet Oral QHS  .  nystatin  5 mL Oral QID  . pantoprazole  40 mg Oral Daily  . sodium chloride flush  10-40 mL Intracatheter Q12H  . thiamine  100 mg Oral Daily  . Thrombi-Pad  1 each Topical Once  . valproic acid  250 mg Oral Q6H  . warfarin  2 mg Oral ONCE-1800  . Warfarin - Pharmacist Dosing Inpatient   Does not apply q1800    BMET    Component Value Date/Time   NA 137 09/11/2019 0429   K 4.6 09/11/2019 0429   CL 98 09/11/2019 0429   CO2 27 09/11/2019 0429   GLUCOSE 113 (H) 09/11/2019 0429   BUN 52 (H) 09/11/2019 0429   CREATININE 3.31 (H) 09/11/2019 0429   CALCIUM 9.2 09/11/2019 0429   GFRNONAA 13 (L) 09/11/2019 0429   GFRAA 15 (L) 09/11/2019 0429   CBC    Component Value Date/Time   WBC 11.8 (H) 09/11/2019 0429   RBC 2.99 (L) 09/11/2019 0429   HGB 8.5 (L) 09/11/2019 0429   HCT 27.4 (L) 09/11/2019 0429   HCT 21.4 (L) 08/21/2019 1040   PLT 221 09/11/2019 0429   MCV  91.6 09/11/2019 0429   MCH 28.4 09/11/2019 0429   MCHC 31.0 09/11/2019 0429   RDW 17.2 (H) 09/11/2019 0429   LYMPHSABS 1.1 09/10/2019 0337   MONOABS 1.0 09/10/2019 0337   EOSABS 0.0 09/10/2019 0337   BASOSABS 0.1 09/10/2019 0337    Assessment/Plan:  1. AKI/CKD stage 4- in setting of cardiac arrest and bacteremia.  Has been dialysis dependent since she started CRRT on 10/30 and has been maintained on IHD on TTS schedule.  Palliative care consult however plan is for full scope of care. 2. Vascular access- s/p RIJ TDC on 08/23/19 by IR.  Was turned down for avf/avg in past due to debilitated state. 3. MSSA bacteremia- Ancepf through 09/18/19. 4. S/p cardiac arrest- h/o cardiomyopathy with EF 20%.  Per cardiology 5. Anemia of CKD- on aranesp 100 mcg every Thursday.  6. Paroxysmal atrial fibrillation- on amiodarone and anticoagulation per cardiology 7. Protein malnutrition- on tube feeding 8. Dysphagia- presumably due to multiple infarcts possibly embolic. 9. Disposition- denied kidnred.  Awaiting LTC placement.   Donetta Potts, MD [employer] [phone]

## 2019-09-12 NOTE — Progress Notes (Signed)
  Speech Language Pathology Treatment: Dysphagia;Cognitive-Linquistic  Patient Details Name: Jaclyn Day MRN: [id number] DOB: [birth year] Today's Date: 09/12/2019 Time: [id number] SLP Time Calculation (min) (ACUTE ONLY): 16 min  Assessment / Plan / Recommendation Clinical Impression  Pt needs Mod-Max cues today for selective attention to bolus preparation of advanced solids in mildly distracting environment. She is also drowsy this morning, needing Min cues to maintain arousal. Oral residue is present but is cleared with Mod cues. Discussed with pt and significant other the need for more efficient oral preparation and clearance (including improved attention) before she will be ready to upgrade. Her significant other says that he will sometimes give her a bite of more solid food. Encouraged him to save these advanced textures for speech therapy treatment until cleared for a more advanced diet overall.    HPI HPI: Pt is a 73 yo F admitted for witnessed out of hospital cardiac arrest, unclear etiology, with field ROSC, resultant respiratory failure and encephalopathy s/p TTM; ETT 10/22-11/1. CRRT initiated 10/30. PMH: unilateral congenital absence of kidney, Afib, HTN, CKD, CHF, anemia, mood disorder.  MRI on 11/7 revealed "Several scattered small acute infarcts in the white matter of the superior frontal lobes, left occipital lobe. No associated hemorrhage or mass effect. 2. Several small subacute or perhaps chronic infarcts in the bilateral cerebellum."      SLP Plan  Continue with current plan of care;MBS       Recommendations  Diet recommendations: Dysphagia 1 (puree);Thin liquid Liquids provided via: Cup;Straw Medication Administration: Crushed with puree Supervision: Staff to assist with self feeding;Full supervision/cueing for compensatory strategies Compensations: Minimize environmental distractions;Slow rate;Small sips/bites Postural Changes and/or Swallow Maneuvers: Seated upright  90 degrees                Oral Care Recommendations: Oral care BID Follow up Recommendations: Skilled Nursing facility SLP Visit Diagnosis: Dysphagia, oropharyngeal phase (R13.12) Plan: Continue with current plan of care;MBS       GO                Jaclyn Day 09/12/2019, 12:20 PM  Jaclyn Day, M.A. [place] Acute Environmental education officer [phone] Office [phone]

## 2019-09-12 NOTE — TOC Progression Note (Addendum)
Transition of Care [facility]) - Progression Note    Patient Details  Name: Jaclyn Day MRN: [id number] Date of Birth: 09/12/1946  Transition of Care [facility]) CM/SW [place], LCSW Phone Number: 09/12/2019, 2:33 PM  Clinical Narrative: Feeding tubes discontinued, therefore pt would qualify for SNF. Called pt significant other to provide bed offers, no answer, had to leave voicemail.      Expected Discharge Plan: Long Term Acute Care (LTAC) Barriers to Discharge: Continued Medical Work up  Expected Discharge Plan and Services Expected Discharge Plan: [place] (LTAC) In-house Referral: NA Discharge Planning Services: CM Consult Post Acute Care Choice: [place] Living arrangements for the past 2 months: Single Family Home                                       Social Determinants of Health (SDOH) Interventions    Readmission Risk Interventions Readmission Risk Prevention Plan 08/08/2019  Transportation Screening Complete  PCP or Specialist Appt within 3-5 Days Complete  HRI or Home Care Consult Complete  Medication Review (RN Care Manager) Complete  Some recent data might be hidden

## 2019-09-12 NOTE — Progress Notes (Signed)
PROGRESS NOTE  Jaclyn Day [id number] DOB: May 11, 1946   PCP: Cyndi Bender, PA-C  Patient is from: Home  DOA: 08/03/2019 LOS: 55  Brief Narrative / Interim history: 73 year old female with history of congenital unilateral kidney, CKD-4, HTN, persistent A. fib on Eliquis and diastolic CHF admitted to ICU 08/03/2023 cardiac arrest.  Found to have new systolic CHF with EF of 20 to 25%, global hypokinesis.  Blood culture grew MSSA for which she was started on vancomycin and transition to Ancef on 08/06/2019.  Patient had echocardiogram on 08/21/2019 that showed EF of 55 to 60%, moderate LAE and RVSP to 39.  Hospital course complicated by A. fib with RVR, acute metabolic encephalopathy/agitation and AKI.  She is a still with cortrack for feeding.  Subjective: No major events overnight of this morning.  No complaint this morning.  She denies chest pain, dyspnea, nausea, vomiting or abdominal pain.  Objective: Vitals:   09/12/19 0500 09/12/19 0815 09/12/19 1144 09/12/19 1145  BP:  135/76 131/88 131/88  Pulse: 93 93 99 94  Resp: 14 (!) 28 16 17   Temp:      TempSrc:      SpO2: 100% 96% 95% 96%  Weight:      Height:        Intake/Output Summary (Last 24 hours) at 09/12/2019 1252 Last data filed at 09/11/2019 2136 Gross per 24 hour  Intake 120 ml  Output 600 ml  Net -480 ml   Filed Weights   09/10/19 0550 09/11/19 0417 09/12/19 0410  Weight: 72 kg 72.9 kg 77.3 kg    Examination:  GENERAL: No acute distress.  Resting comfortably. HEENT: MMM.  Vision and hearing grossly intact.  Cortrak in place. NECK: Supple.  No apparent JVD.  RESP:  No IWOB.  Fair air movement bilaterally. CVS: IR.  HR in 90s.  Heart sounds normal.  ABD/GI/GU: Bowel sounds present. Soft. Non tender.  MSK/EXT: No apparent deformity or edema.  Bilateral lower extremity weakness SKIN: Stage I sacral skin injury NEURO: Awake, alert and oriented fairly.  CN grossly intact.  Resting LUE tremor.  Motor  3+/5 in RUE.  3/5 in LUE.  2+/5 in both LEs. PSYCH: Calm. Normal affect.    Assessment & Plan: Acute respiratory failure with hypoxia "multifactorial including cardiac arrest, pulmonary edema, bilateral pleural effusion and anxiety.  Resolved.  She is now on room air. -treat treatable causes.  MSSA bacteremia/AV endocarditis: Bandemia improving.  Remains afebrile.  Leukocytosis improving. -Continue Ancef until 09/18/2019 per ID-total of 6 weeks  Leukocytosis/bandemia: Likely due to the above.  Improving. -Continue trending.  V. tach/cardiac arrest/acute systolic CHF: Initial TTE on 10/23 with EF of 20 to 25%.  Repeat TTE on 11/9 with EF of 55 to 60%.  Currently euvolemic.  -S/p ETT and hypothermia protocol in ICU -Fluid management by HD.  Acute metabolic encephalopathy: Resolved.  She is oriented x4 except year but limited insight -Frequent reorientation and delirium precautions.  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  Patient with significant upper and lower extremity weakness left greater than right mainly in upper extremities. -Continue warfarin per pharmacy   Persistent A. fib: rate and rhythm controlled. -Continue p.o. amiodarone and warfarin  Anemia: Multifactorial-acute blood loss from HD cath 11/15, chronic disease and malnutrition. -Hgb 11.6 (admit and baseline)>> 6.9 (11/10)>1u> 8.2>> 5.9 (11/16)> 8.8>> 7.6>1u>8.8>> 8.5 -On Aranesp and iron per nephro -Optimize nutrition -Monitor H&H  CKD-4: Likely hemodynamically mediated in the  setting of cardiac issues and bacteremia -CRRT 10/30-11/1>>IHD  -Continue monitoring  Metabolic acidosis/bone mineral disorder/hyperkalemia: Likely due to renal failure. -Per nephrology.  Hypotension: Normotensive. -Continue midodrine.  Dysphagia: SLP and dietitian following.   -Continue tube feed per Cortrack -Dysphagia 1 diet per SLP-continue calorie count  Oral  thrush -Continue nystatin  Vitamin D deficiency -On weekly vitamin D 50,000 units  Stage I sacral decubitus ulcer: Not present on admission -Dressing and offloading per RN  Resting LUE tremor: Parkinsonism? -Continue monitoring.  Moderate malnutrition: Nutrition Problem: Moderate Malnutrition Etiology: acute illness(acute respiratory failure, AKI required CRRT, endocarditis)  Signs/Symptoms: mild fat depletion, mild muscle depletion, moderate muscle depletion  Interventions: Tube feeding   DVT prophylaxis: Subcu heparin Code Status: Full code Family Communication: Patient and/or RN. Available if any question. Disposition Plan: Remains inpatient.  Final disposition LTAC.  Declined by [facility]: Cardiology (signed off), neurology (signed off), IR (signed off), palliative, nephrology  Procedures:  HD cath placement CRRT IHD  Microbiology summarized: 10/22-COVID-19 negative 10/22-MRSA PCR negative 10/22-respiratory viral panel negative 10/22-urine culture with E. Coli 10/22-blood culture with MSSA in 1/2 bottles 10/26, 10/29, 11/16 and 11/20-blood cultures negative  Sch Meds:  Scheduled Meds: .  stroke: mapping our early stages of recovery book   Does not apply Once  . sodium chloride   Intravenous Once  . amiodarone  200 mg Oral Daily  . ARIPiprazole  10 mg Oral BID  . atorvastatin  20 mg Oral Daily  . chlorhexidine  15 mL Mouth Rinse BID  . Chlorhexidine Gluconate Cloth  6 each Topical Q0600  . coumadin book   Does not apply Once  . [START ON 09/14/2019] darbepoetin (ARANESP) injection - DIALYSIS  100 mcg Intravenous Q Thu-HD  . feeding supplement  1 Container Oral TID BM  . feeding supplement (NEPRO CARB STEADY)  237 mL Oral BID BM  . feeding supplement (PRO-STAT SUGAR FREE 64)  30 mL Oral BID  . ferrous sulfate  325 mg Oral Daily  . insulin aspart  0-9 Units Subcutaneous Q4H  . lipase/protease/amylase)  20,880 Units Per Tube Once   And  . sodium  bicarbonate  650 mg Per Tube Once  . mouth rinse  15 mL Mouth Rinse BID  . multivitamin  1 tablet Oral QHS  . nystatin  5 mL Oral QID  . pantoprazole  40 mg Oral Daily  . sodium chloride flush  10-40 mL Intracatheter Q12H  . thiamine  100 mg Oral Daily  . Thrombi-Pad  1 each Topical Once  . valproic acid  250 mg Oral Q6H  . warfarin  2 mg Oral ONCE-1800  . Warfarin - Pharmacist Dosing Inpatient   Does not apply q1800   Continuous Infusions: . sodium chloride 10 mL (08/20/19 2025)  . sodium chloride    . sodium chloride    .  ceFAZolin (ANCEF) IV 2 g (09/09/19 1844)  . feeding supplement (OSMOLITE 1.5 CAL) 1,000 mL (09/11/19 2138)   PRN Meds:.sodium chloride, sodium chloride, acetaminophen, alteplase, heparin, hydrALAZINE, HYDROmorphone (DILAUDID) injection, levalbuterol, lidocaine (PF), lidocaine-prilocaine, pentafluoroprop-tetrafluoroeth, sodium chloride flush  Antimicrobials: Anti-infectives (From admission, onward)   Start     Dose/Rate Route Frequency Ordered Stop   09/09/19 1800  ceFAZolin (ANCEF) IVPB 2g/100 mL premix     2 g 200 mL/hr over 30 Minutes Intravenous Every T-Th-Sa (1800) 09/05/19 1024     09/04/19 1800  ceFAZolin (ANCEF) IVPB 2g/100 mL premix     2 g 200 mL/hr over 30  Minutes Intravenous Every M-W-F (1800) 09/04/19 1335 09/06/19 1736   08/31/19 1800  ceFAZolin (ANCEF) IVPB 2g/100 mL premix  Status:  Discontinued     2 g 200 mL/hr over 30 Minutes Intravenous Every T-Th-Sa (1800) 08/31/19 1401 09/04/19 1335   08/30/19 0900  ceFAZolin (ANCEF) IVPB 2g/100 mL premix     2 g 200 mL/hr over 30 Minutes Intravenous  Once 08/30/19 0854 08/30/19 1134   08/24/19 1200  ceFAZolin (ANCEF) IVPB 2g/100 mL premix  Status:  Discontinued     2 g 200 mL/hr over 30 Minutes Intravenous Every T-Th-Sa (Hemodialysis) 08/23/19 0807 08/31/19 1401   08/20/19 2200  ceFAZolin (ANCEF) IVPB 1 g/50 mL premix  Status:  Discontinued     1 g 100 mL/hr over 30 Minutes Intravenous Daily at  bedtime 08/20/19 1507 08/23/19 0807   08/15/19 2200  ceFAZolin (ANCEF) IVPB 2g/100 mL premix  Status:  Discontinued     2 g 200 mL/hr over 30 Minutes Intravenous Daily at bedtime 08/15/19 1133 08/20/19 1507   08/11/19 1800  ceFAZolin (ANCEF) IVPB 2g/100 mL premix  Status:  Discontinued     2 g 200 mL/hr over 30 Minutes Intravenous Every 12 hours 08/11/19 1110 08/15/19 1133   08/10/19 1600  ceFAZolin (ANCEF) IVPB 1 g/50 mL premix  Status:  Discontinued     1 g 100 mL/hr over 30 Minutes Intravenous Every 12 hours 08/10/19 1530 08/11/19 1110   08/06/19 1800  ceFAZolin (ANCEF) IVPB 1 g/50 mL premix  Status:  Discontinued     1 g 100 mL/hr over 30 Minutes Intravenous Every 24 hours 08/06/19 1025 08/10/19 1530   08/05/19 1530  vancomycin (VANCOCIN) 1,750 mg in sodium chloride 0.9 % 500 mL IVPB     1,750 mg 250 mL/hr over 120 Minutes Intravenous  Once 08/05/19 1444 08/05/19 1736   08/05/19 1443  vancomycin variable dose per unstable renal function (pharmacist dosing)  Status:  Discontinued      Does not apply See admin instructions 08/05/19 1444 08/06/19 1025   08/03/19 1300  cefTRIAXone (ROCEPHIN) 2 g in sodium chloride 0.9 % 100 mL IVPB  Status:  Discontinued     2 g 200 mL/hr over 30 Minutes Intravenous Every 24 hours 08/03/19 1251 08/06/19 1025       I have personally reviewed the following labs and images: CBC: Recent Labs  Lab 09/07/19 0315 09/08/19 0315 09/09/19 0347 09/10/19 0337 09/11/19 0429  WBC 16.3* 16.3* 14.7* 12.9* 11.8*  NEUTROABS  --   --   --  10.4*  --   HGB 7.6* 8.8* 9.3* 8.9* 8.5*  HCT 24.9* 28.5* 29.7* 29.0* 27.4*  MCV 94.3 91.3 90.3 91.2 91.6  PLT 331 299 270 264 221   BMP &GFR Recent Labs  Lab 09/07/19 0315 09/08/19 0315 09/09/19 0347 09/10/19 0337 09/11/19 0429  NA 136 137 137 136 137  K 3.9 4.7 5.3* 4.0 4.6  CL 96* 96* 95* 98 98  CO2 30 27 25 28 27   GLUCOSE 109* 110* 89 140* 113*  BUN 18 46* 69* 27* 52*  CREATININE 1.72* 3.01* 3.89* 2.17*  3.31*  CALCIUM 8.5* 9.1 9.5 8.6* 9.2  MG 1.9 2.1 2.2 1.9 2.0  PHOS 3.4 4.5 5.7* 3.7 5.0*   Estimated Creatinine Clearance: 16.1 mL/min (A) (by C-G formula based on SCr of 3.31 mg/dL (H)). Liver & Pancreas: Recent Labs  Lab 09/07/19 0315 09/08/19 0315 09/09/19 0347 09/10/19 0337 09/11/19 0429  ALBUMIN 1.7* 1.8* 2.0* 1.7* 1.8*  No results for input(s): LIPASE, AMYLASE in the last 168 hours. No results for input(s): AMMONIA in the last 168 hours. Diabetic: No results for input(s): HGBA1C in the last 72 hours. Recent Labs  Lab 09/11/19 1955 09/11/19 2349 09/12/19 0412 09/12/19 0812 09/12/19 1141  GLUCAP 114* 119* 99 84 163*   Cardiac Enzymes: No results for input(s): CKTOTAL, CKMB, CKMBINDEX, TROPONINI in the last 168 hours. No results for input(s): PROBNP in the last 8760 hours. Coagulation Profile: Recent Labs  Lab 09/08/19 0315 09/09/19 0347 09/10/19 0337 09/11/19 0429 09/12/19 0442  INR 2.4* 2.6* 2.8* 3.1* 2.6*   Thyroid Function Tests: No results for input(s): TSH, T4TOTAL, FREET4, T3FREE, THYROIDAB in the last 72 hours. Lipid Profile: No results for input(s): CHOL, HDL, LDLCALC, TRIG, CHOLHDL, LDLDIRECT in the last 72 hours. Anemia Panel: No results for input(s): VITAMINB12, FOLATE, FERRITIN, TIBC, IRON, RETICCTPCT in the last 72 hours. Urine analysis:    Component Value Date/Time   COLORURINE BROWN (A) 08/05/2019 2018   APPEARANCEUR TURBID (A) 08/05/2019 2018   LABSPEC RESULTS UNAVAILABLE DUE TO INTERFERING SUBSTANCE 08/05/2019 2018   PHURINE RESULTS UNAVAILABLE DUE TO INTERFERING SUBSTANCE 08/05/2019 2018   GLUCOSEU RESULTS UNAVAILABLE DUE TO INTERFERING SUBSTANCE (A) 08/05/2019 2018   HGBUR RESULTS UNAVAILABLE DUE TO INTERFERING SUBSTANCE (A) 08/05/2019 2018   BILIRUBINUR RESULTS UNAVAILABLE DUE TO INTERFERING SUBSTANCE (A) 08/05/2019 2018   KETONESUR RESULTS UNAVAILABLE DUE TO INTERFERING SUBSTANCE (A) 08/05/2019 2018   PROTEINUR RESULTS UNAVAILABLE  DUE TO INTERFERING SUBSTANCE (A) 08/05/2019 2018   UROBILINOGEN 0.2 05/07/2009 1520   NITRITE RESULTS UNAVAILABLE DUE TO INTERFERING SUBSTANCE (A) 08/05/2019 2018   LEUKOCYTESUR RESULTS UNAVAILABLE DUE TO INTERFERING SUBSTANCE (A) 08/05/2019 2018   Sepsis Labs: Invalid input(s): PROCALCITONIN, [place]  Microbiology: No results found for this or any previous visit (from the past 240 hour(s)).  Radiology Studies: No results found.  Laniqua Torrens T. Hettick  If 7PM-7AM, please contact night-coverage [email or web address] Password [email or web address] 09/12/2019, 12:52 PM

## 2019-09-12 NOTE — Progress Notes (Signed)
Nutrition Follow-up  DOCUMENTATION CODES:   Non-severe (moderate) malnutrition in context of acute illness/injury  INTERVENTION:   -Discontinue night tube feedings via Cortrak  -Boost Breeze po TID, each supplement provides 250 kcal and 9 grams of protein -Nepro Shake po BID, each supplement provides 425 kcal and 19 grams protein -Rena-vit daily -Prostat liquid protein PO 30 ml BID with meals, each supplement provides 100 kcal, 15 grams protein.  NUTRITION DIAGNOSIS:   Moderate Malnutrition related to acute illness(acute respiratory failure, AKI required CRRT, endocarditis) as evidenced by mild fat depletion, mild muscle depletion, moderate muscle depletion.  Ongoing.  GOAL:   Patient will meet greater than or equal to 90% of their needs  Progressing.  MONITOR:   PO intakes, supplemental acceptance, Diet advancement, Labs, Weight trends, Skin  ASSESSMENT:   73 year old female who presented to the ED on 10/22 after cardiac arrest. PMH anemia, CHF, CKD stage IV, HTN, IDDM. Pt required intubation in the ED. TTM initiated.  10/22 -Admit, Intubated, normothermia TTM 10/24 - rewarmed, TF initiated 10/25 - HD catheter placed, first HD 10/27 - HD 10/28 - s/p TEE suspicious for vegetation 10/30 - CRRT initiated 11/01 - CRRT discontinued, extubated 11/03 -Cortrak tube placed, TF initiated 11/11- s/p HD cath placement 11/13- s/p MBSS- advanced to dysphagia 1 diet with thin liquids  **RD working remotely**  Per documentation intakes for 11/29: B: 780 kcals, 26g protein L: 420 kcals, 20g protein D: 500 kcals, 36g protein Supplements: 875 kcals, 58g protein Total: 2575 kcals, 140g protein  Patient ate very well on 11/29. Overall intakes have improved. However, pt does refuse a meal occasionally. If patient takes protein supplements, can meet estimated needs. Will continue Boost Breeze, Nepro and Prostat supplements. Can d/c off night feeds.  Per SLP note, pt with  selective attention and distracted during evaluation. Recommends pt continue on dysphagia 1 diet at this time.   Admission weight: 192 lbs. Current weight: 170 lbs. I/Os: -285 ml since 11/17 UOP: 600 ml x 24 hrs  Last HD 11/28.   Medications: Ferrous sulfate tablet, Rena-Vit tablet, Thiamine tablet Labs reviewed:  CBGs: 84-163  Diet Order:   Diet Order            DIET - DYS 1 Room service appropriate? Yes; Fluid consistency: Thin  Diet effective now              EDUCATION NEEDS:   Not appropriate for education at this time  Skin:  Skin Assessment: Reviewed RN Assessment Skin Integrity Issues:: DTI DTI: -  Last BM:  11/29 -type 4  Height:   Ht Readings from Last 1 Encounters:  08/03/19 5\' 6"  (1.676 m)    Weight:   Wt Readings from Last 1 Encounters:  09/12/19 77.3 kg    Ideal Body Weight:  59.1 kg  BMI:  Body mass index is 27.51 kg/m.  Estimated Nutritional Needs:   Kcal:  [id number]  Protein:  89-105  Fluid:  >/= 1.5 L  Clayton Bibles, MS, RD, LDN Inpatient Clinical Dietitian Pager: [phone] After Hours Pager: [phone]

## 2019-09-13 LAB — RENAL FUNCTION PANEL
Albumin: 1.8 g/dL — ABNORMAL LOW (ref 3.5–5.0)
Albumin: 1.8 g/dL — ABNORMAL LOW (ref 3.5–5.0)
Anion gap: 15 (ref 5–15)
Anion gap: 13 (ref 5–15)
BUN: 85 mg/dL — ABNORMAL HIGH (ref 8–23)
BUN: 30 mg/dL — ABNORMAL HIGH (ref 8–23)
CO2: 25 mmol/L (ref 22–32)
CO2: 27 mmol/L (ref 22–32)
Calcium: 9.1 mg/dL (ref 8.9–10.3)
Calcium: 8.9 mg/dL (ref 8.9–10.3)
Chloride: 95 mmol/L — ABNORMAL LOW (ref 98–111)
Chloride: 96 mmol/L — ABNORMAL LOW (ref 98–111)
Creatinine, Ser: 4.32 mg/dL — ABNORMAL HIGH (ref 0.44–1.00)
Creatinine, Ser: 2.1 mg/dL — ABNORMAL HIGH (ref 0.44–1.00)
GFR calc Af Amer: 11 mL/min — ABNORMAL LOW (ref >60)
GFR calc Af Amer: 27 mL/min — ABNORMAL LOW (ref >60)
GFR calc non Af Amer: 10 mL/min — ABNORMAL LOW (ref >60)
GFR calc non Af Amer: 23 mL/min — ABNORMAL LOW (ref >60)
Glucose, Bld: 98 mg/dL (ref 70–99)
Glucose, Bld: 111 mg/dL — ABNORMAL HIGH (ref 70–99)
Phosphorus: 6.1 mg/dL — ABNORMAL HIGH (ref 2.5–4.6)
Phosphorus: 3.7 mg/dL (ref 2.5–4.6)
Potassium: 5.2 mmol/L — ABNORMAL HIGH (ref 3.5–5.1)
Potassium: 4.3 mmol/L (ref 3.5–5.1)
Sodium: 135 mmol/L (ref 135–145)
Sodium: 136 mmol/L (ref 135–145)

## 2019-09-13 LAB — CBC
HCT: 26.4 % — ABNORMAL LOW (ref 36.0–46.0)
Hemoglobin: 8.3 g/dL — ABNORMAL LOW (ref 12.0–15.0)
MCH: 28.6 pg (ref 26.0–34.0)
MCHC: 31.4 g/dL (ref 30.0–36.0)
MCV: 91 fL (ref 80.0–100.0)
Platelets: 204 10*3/uL (ref 150–400)
RBC: 2.9 MIL/uL — ABNORMAL LOW (ref 3.87–5.11)
RDW: 17.5 % — ABNORMAL HIGH (ref 11.5–15.5)
WBC: 12.8 10*3/uL — ABNORMAL HIGH (ref 4.0–10.5)
nRBC: 0 % (ref 0.0–0.2)

## 2019-09-13 LAB — GLUCOSE, CAPILLARY
Glucose-Capillary: 109 mg/dL — ABNORMAL HIGH (ref 70–99)
Glucose-Capillary: 116 mg/dL — ABNORMAL HIGH (ref 70–99)
Glucose-Capillary: 89 mg/dL (ref 70–99)
Glucose-Capillary: 92 mg/dL (ref 70–99)
Glucose-Capillary: 97 mg/dL (ref 70–99)

## 2019-09-13 LAB — PROTIME-INR
INR: 2.2 — ABNORMAL HIGH (ref 0.8–1.2)
Prothrombin Time: 24 seconds — ABNORMAL HIGH (ref 11.4–15.2)

## 2019-09-13 MED ORDER — WARFARIN SODIUM 2 MG PO TABS
2.0000 mg | ORAL_TABLET | Freq: Once | ORAL | Status: AC
  Administered 2019-09-13: 19:00:00 2 mg via ORAL
  Filled 2019-09-13: qty 1

## 2019-09-13 NOTE — Progress Notes (Signed)
Physical Therapy Treatment Patient Details Name: Jaclyn Day MRN: [id number] DOB: 12-Feb-1946 Today's Date: 09/13/2019    History of Present Illness 73yo female admitted after out of hospital cardiac arrest, unclear etiology CPR started by fire 10 min CPR with 1 Epi before ROSC. PMH includes a-fib, diastolic CHF, Mood disorder, CKD. CRRT initiated on 10/31 for volume removal. Pt transitioned to intermittent HD.     PT Comments    Pt supine in bed on arrival with her boyfriend sitting at the bedside.  Pt is very nervous to mobilize but did progress to partial standing in sara stedy to move from bed to recliner.  Plan next session for continued strengthening and progress to achieve full upright standing.  Continue to recommend SNF based on slow progress.      Follow Up Recommendations  SNF;Supervision/Assistance - 24 hour     Equipment Recommendations  None recommended by PT    Recommendations for Other Services       Precautions / Restrictions Precautions Precautions: Fall Precaution Comments: BUE and BLE weakness with digit extension limitations noted as well, increased swelling in L UE. Restrictions Weight Bearing Restrictions: No    Mobility  Bed Mobility Overal bed mobility: Needs Assistance Bed Mobility: Supine to Sit     Supine to sit: Max assist;+2 for physical assistance     General bed mobility comments: Max +2 to move LEs to edge of bed to elevate trunk into a seated position.  Total +2 to scoot pad to edge of bed while maintaining trunk control.  Placed sara stedy in front of patient sitting edge of bed.  She was able to grip with her R hand and poorly grip with LUE ( very edematous)  Transfers Overall transfer level: Needs assistance Equipment used: Ambulation equipment used(sara stedy)            Lateral/Scoot Transfers: Max assist;+2 physical assistance;Total assist General transfer comment: Performed partial sit to stand clearing buttocks to place  sara stedy plates.  Pt postures to forward flexion resting her head on sara stedy cross bar.  facilitation utilized for upper trunk control.  She performed additonal sit to stand to move plates and sit in recliner.  Poor eccentric loading when returning to a seated position.  Ambulation/Gait                 Stairs             Wheelchair Mobility    Modified Rankin (Stroke Patients Only)       Balance Overall balance assessment: Needs assistance Sitting-balance support: Feet supported;Single extremity supported Sitting balance-Leahy Scale: Poor       Standing balance-Leahy Scale: Zero                              Cognition Arousal/Alertness: Awake/alert Behavior During Therapy: Flat affect Overall Cognitive Status: Impaired/Different from baseline                                 General Comments: Pt is slow to process simple commands but able to with increased time and appears appropriate during session.      Exercises      General Comments        Pertinent Vitals/Pain Pain Assessment: Faces Faces Pain Scale: Hurts even more Pain Location: BUE with movement, BLE with movement Pain Descriptors / Indicators: Grimacing;Guarding  Pain Intervention(s): Monitored during session;Repositioned    Home Living                      Prior Function            PT Goals (current goals can now be found in the care plan section) Acute Rehab PT Goals Patient Stated Goal: to get cortrak out Potential to Achieve Goals: Fair Progress towards PT goals: Progressing toward goals    Frequency    Min 2X/week      PT Plan Current plan remains appropriate    Co-evaluation PT/OT/SLP Co-Evaluation/Treatment: Yes Reason for Co-Treatment: Complexity of the patient's impairments (multi-system involvement);For patient/therapist safety;To address functional/ADL transfers PT goals addressed during session: Mobility/safety with  mobility OT goals addressed during session: ADL's and self-care;Proper use of Adaptive equipment and DME      AM-PAC PT "6 Clicks" Mobility   Outcome Measure  Help needed turning from your back to your side while in a flat bed without using bedrails?: Total Help needed moving from lying on your back to sitting on the side of a flat bed without using bedrails?: Total Help needed moving to and from a bed to a chair (including a wheelchair)?: Total Help needed standing up from a chair using your arms (e.g., wheelchair or bedside chair)?: Total Help needed to walk in hospital room?: Total Help needed climbing 3-5 steps with a railing? : Total 6 Click Score: 6    End of Session   Activity Tolerance: Patient tolerated treatment well Patient left: with call bell/phone within reach;in chair;with family/visitor present;with nursing/sitter in room Nurse Communication: Mobility status;Need for lift equipment(informed nursing of need for purwick and that lift pad was in place for back to bed.) PT Visit Diagnosis: Muscle weakness (generalized) (M62.81)     Time: [id number] PT Time Calculation (min) (ACUTE ONLY): 26 min  Charges:  $Therapeutic Activity: 8-22 mins                     Erasmo Leventhal , PTA Acute Rehabilitation Services Pager [phone] Office [phone]     Ahkeem Goede Eli Hose 09/13/2019, 3:20 PM

## 2019-09-13 NOTE — Progress Notes (Signed)
PROGRESS NOTE  Jaclyn Day [id number] DOB: Feb 20, 1946   PCP: Cyndi Bender, PA-C  Patient is from: Home  DOA: 08/03/2019 LOS: 40  Brief Narrative / Interim history: 73 year old female with history of congenital unilateral kidney, CKD-4, HTN, persistent A. fib on Eliquis and diastolic CHF admitted to ICU 08/03/2023 cardiac arrest.  Found to have new systolic CHF with EF of 20 to 25%, global hypokinesis.  Blood culture grew MSSA for which she was started on vancomycin and transition to Ancef on 08/06/2019.  Patient had echocardiogram on 08/21/2019 that showed EF of 55 to 60%, moderate LAE and RVSP to 39.  Hospital course complicated by A. fib with RVR, acute metabolic encephalopathy/agitation and AKI.    Subjective: No major events overnight of this morning.  No complaint this morning.  She denies chest pain, dyspnea, cough or GI symptoms.  Currently on dialysis.   Objective: Vitals:   09/13/19 0845 09/13/19 0915 09/13/19 0945 09/13/19 1015  BP: (!) 103/43 (!) 84/48 (!) 79/37 (!) 98/47  Pulse: 80 (!) 104 79 93  Resp: (!) 22 20 (!) 22 20  Temp:      TempSrc:      SpO2: 96% 95% 96% 98%  Weight:      Height:        Intake/Output Summary (Last 24 hours) at 09/13/2019 1034 Last data filed at 09/13/2019 0700 Gross per 24 hour  Intake 10 ml  Output 550 ml  Net -540 ml   Filed Weights   09/11/19 0417 09/12/19 0410 09/13/19 0730  Weight: 72.9 kg 77.3 kg 75.8 kg    Examination:  GENERAL: No acute distress.  Appears well.  HEENT: MMM.  Vision and hearing grossly intact.  NECK: Supple.  No apparent JVD.  RESP:  No IWOB.  Fair air movement bilaterally. CVS: IR.  HR in 90s. Heart sounds normal.  ABD/GI/GU: Bowel sounds present. Soft. Non tender.  MSK/EXT: Bilateral lower extremity weakness. SKIN: Stage I sacral skin injury NEURO: Awake, alert and oriented fairly.  CN grossly intact.  Motor 4/5 in RUE.  3+/5 in LUE.  2+/5 in both LEs. PYCH: Calm and normal affect.   Assessment & Plan: Acute respiratory failure with hypoxia "multifactorial including cardiac arrest, pulmonary edema, bilateral pleural effusion and anxiety.  Resolved.  She is now on room air. -treat treatable causes.  MSSA bacteremia/AV endocarditis: Bandemia improving.  Remains afebrile.  Leukocytosis improving. -Continue Ancef until 09/18/2019 per ID-total of 6 weeks  Leukocytosis/bandemia: Likely due to the above.  Improving. -Continue trending.  V. tach/cardiac arrest/acute systolic CHF: Initial TTE on 10/23 with EF of 20 to 25%.  Repeat TTE on 11/9 with EF of 55 to 60%.  Currently euvolemic.  -S/p ETT and hypothermia protocol in ICU -Fluid management by HD.  Acute metabolic encephalopathy: Resolved.  Oriented fairly but limited insight -Frequent reorientation and delirium precautions.  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  Patient with significant upper and lower extremity weakness left greater than right mainly in upper extremities. -Continue warfarin per pharmacy   Persistent A. fib: rate and rhythm controlled. -Continue p.o. amiodarone and warfarin  Anemia: Multifactorial-acute blood loss from HD cath 11/15, chronic disease and malnutrition. -Hgb 11.6 (admit & b/l)>> 6.9 (11/10)>1u> 8.2>> 5.9 (11/16)> 8.8>> 7.6>1u>8.8>> 8.3 -On Aranesp and iron per nephro -Optimize nutrition -Monitor H&H  AKI/CKD-4: Likely likely due to shock from cardiac issues and bacteremia.  Having fair urine output although I and O incomplete. -  Right IJ TDC on 11/11.  Tunneled down for permanent access due to debility -CRRT 10/30-11/1>>IHD  -Continue monitoring-seems to have fair urine output now.  Metabolic acidosis/bone mineral disorder/hyperkalemia: Likely due to renal failure. -Per nephrology.  Hypotension: Normotensive. -Continue midodrine.  Dysphagia: SLP and dietitian following.   -Tube feed stopped 12/1 per  RD-supplements ordered.  Will discontinue cortak -Appreciate SLP input-continue dysphagia 1 diet  Oral thrush: Resolved -Discontinue nystatin  Vitamin D deficiency -On weekly vitamin D 50,000 units  Stage I sacral decubitus ulcer: Not present on admission -Dressing and offloading per RN  Resting LUE tremor: Parkinsonism? -Continue monitoring.  Moderate malnutrition: Nutrition Problem: Moderate Malnutrition Etiology: acute illness(acute respiratory failure, AKI required CRRT, endocarditis)  Signs/Symptoms: mild fat depletion, mild muscle depletion, moderate muscle depletion  Interventions: Tube feeding   DVT prophylaxis: Subcu heparin Code Status: Full code Family Communication: Patient and/or RN. Available if any question. Disposition Plan: Remains inpatient.  Final disposition LTAC.  Declined by [facility]: Cardiology (signed off), neurology (signed off), IR (signed off), palliative, nephrology  Procedures:  HD cath placement CRRT IHD  Microbiology summarized: 10/22-COVID-19 negative 10/22-MRSA PCR negative 10/22-respiratory viral panel negative 10/22-urine culture with E. Coli 10/22-blood culture with MSSA in 1/2 bottles 10/26, 10/29, 11/16 and 11/20-blood cultures negative  Sch Meds:  Scheduled Meds: .  stroke: mapping our early stages of recovery book   Does not apply Once  . sodium chloride   Intravenous Once  . amiodarone  200 mg Oral Daily  . ARIPiprazole  10 mg Oral BID  . atorvastatin  20 mg Oral Daily  . chlorhexidine  15 mL Mouth Rinse BID  . Chlorhexidine Gluconate Cloth  6 each Topical Q0600  . coumadin book   Does not apply Once  . [START ON 09/14/2019] darbepoetin (ARANESP) injection - DIALYSIS  100 mcg Intravenous Q Thu-HD  . feeding supplement  1 Container Oral TID BM  . feeding supplement (NEPRO CARB STEADY)  237 mL Oral BID BM  . feeding supplement (PRO-STAT SUGAR FREE 64)  30 mL Oral BID  . ferrous sulfate  325 mg Oral Daily  .  insulin aspart  0-9 Units Subcutaneous Q4H  . lipase/protease/amylase)  20,880 Units Per Tube Once   And  . sodium bicarbonate  650 mg Per Tube Once  . mouth rinse  15 mL Mouth Rinse BID  . multivitamin  1 tablet Oral QHS  . nystatin  5 mL Oral QID  . pantoprazole  40 mg Oral Daily  . sodium chloride flush  10-40 mL Intracatheter Q12H  . thiamine  100 mg Oral Daily  . Thrombi-Pad  1 each Topical Once  . valproic acid  250 mg Oral Q6H  . warfarin  2 mg Oral ONCE-1800  . Warfarin - Pharmacist Dosing Inpatient   Does not apply q1800   Continuous Infusions: . sodium chloride 10 mL (08/20/19 2025)  . sodium chloride    . sodium chloride    .  ceFAZolin (ANCEF) IV 2 g (09/12/19 2009)   PRN Meds:.sodium chloride, sodium chloride, acetaminophen, alteplase, heparin, hydrALAZINE, HYDROmorphone (DILAUDID) injection, levalbuterol, lidocaine (PF), lidocaine-prilocaine, pentafluoroprop-tetrafluoroeth, sodium chloride flush  Antimicrobials: Anti-infectives (From admission, onward)   Start     Dose/Rate Route Frequency Ordered Stop   09/09/19 1800  ceFAZolin (ANCEF) IVPB 2g/100 mL premix     2 g 200 mL/hr over 30 Minutes Intravenous Every T-Th-Sa (1800) 09/05/19 1024     09/04/19 1800  ceFAZolin (ANCEF) IVPB 2g/100 mL premix  2 g 200 mL/hr over 30 Minutes Intravenous Every M-W-F (1800) 09/04/19 1335 09/06/19 1736   08/31/19 1800  ceFAZolin (ANCEF) IVPB 2g/100 mL premix  Status:  Discontinued     2 g 200 mL/hr over 30 Minutes Intravenous Every T-Th-Sa (1800) 08/31/19 1401 09/04/19 1335   08/30/19 0900  ceFAZolin (ANCEF) IVPB 2g/100 mL premix     2 g 200 mL/hr over 30 Minutes Intravenous  Once 08/30/19 0854 08/30/19 1134   08/24/19 1200  ceFAZolin (ANCEF) IVPB 2g/100 mL premix  Status:  Discontinued     2 g 200 mL/hr over 30 Minutes Intravenous Every T-Th-Sa (Hemodialysis) 08/23/19 0807 08/31/19 1401   08/20/19 2200  ceFAZolin (ANCEF) IVPB 1 g/50 mL premix  Status:  Discontinued     1 g  100 mL/hr over 30 Minutes Intravenous Daily at bedtime 08/20/19 1507 08/23/19 0807   08/15/19 2200  ceFAZolin (ANCEF) IVPB 2g/100 mL premix  Status:  Discontinued     2 g 200 mL/hr over 30 Minutes Intravenous Daily at bedtime 08/15/19 1133 08/20/19 1507   08/11/19 1800  ceFAZolin (ANCEF) IVPB 2g/100 mL premix  Status:  Discontinued     2 g 200 mL/hr over 30 Minutes Intravenous Every 12 hours 08/11/19 1110 08/15/19 1133   08/10/19 1600  ceFAZolin (ANCEF) IVPB 1 g/50 mL premix  Status:  Discontinued     1 g 100 mL/hr over 30 Minutes Intravenous Every 12 hours 08/10/19 1530 08/11/19 1110   08/06/19 1800  ceFAZolin (ANCEF) IVPB 1 g/50 mL premix  Status:  Discontinued     1 g 100 mL/hr over 30 Minutes Intravenous Every 24 hours 08/06/19 1025 08/10/19 1530   08/05/19 1530  vancomycin (VANCOCIN) 1,750 mg in sodium chloride 0.9 % 500 mL IVPB     1,750 mg 250 mL/hr over 120 Minutes Intravenous  Once 08/05/19 1444 08/05/19 1736   08/05/19 1443  vancomycin variable dose per unstable renal function (pharmacist dosing)  Status:  Discontinued      Does not apply See admin instructions 08/05/19 1444 08/06/19 1025   08/03/19 1300  cefTRIAXone (ROCEPHIN) 2 g in sodium chloride 0.9 % 100 mL IVPB  Status:  Discontinued     2 g 200 mL/hr over 30 Minutes Intravenous Every 24 hours 08/03/19 1251 08/06/19 1025       I have personally reviewed the following labs and images: CBC: Recent Labs  Lab 09/08/19 0315 09/09/19 0347 09/10/19 0337 09/11/19 0429 09/13/19 0459  WBC 16.3* 14.7* 12.9* 11.8* 12.8*  NEUTROABS  --   --  10.4*  --   --   HGB 8.8* 9.3* 8.9* 8.5* 8.3*  HCT 28.5* 29.7* 29.0* 27.4* 26.4*  MCV 91.3 90.3 91.2 91.6 91.0  PLT 299 270 264 221 204   BMP &GFR Recent Labs  Lab 09/07/19 0315 09/08/19 0315 09/09/19 0347 09/10/19 0337 09/11/19 0429 09/13/19 0459  NA 136 137 137 136 137 135  K 3.9 4.7 5.3* 4.0 4.6 5.2*  CL 96* 96* 95* 98 98 95*  CO2 30 27 25 28 27 25   GLUCOSE 109* 110*  89 140* 113* 98  BUN 18 46* 69* 27* 52* 85*  CREATININE 1.72* 3.01* 3.89* 2.17* 3.31* 4.32*  CALCIUM 8.5* 9.1 9.5 8.6* 9.2 9.1  MG 1.9 2.1 2.2 1.9 2.0  --   PHOS 3.4 4.5 5.7* 3.7 5.0* 6.1*   Estimated Creatinine Clearance: 12.2 mL/min (A) (by C-G formula based on SCr of 4.32 mg/dL (H)). Liver & Pancreas: Recent Labs  Lab 09/08/19 0315 09/09/19 0347 09/10/19 0337 09/11/19 0429 09/13/19 0459  ALBUMIN 1.8* 2.0* 1.7* 1.8* 1.8*   No results for input(s): LIPASE, AMYLASE in the last 168 hours. No results for input(s): AMMONIA in the last 168 hours. Diabetic: No results for input(s): HGBA1C in the last 72 hours. Recent Labs  Lab 09/12/19 1141 09/12/19 1632 09/12/19 1957 09/13/19 0042 09/13/19 0412  GLUCAP 163* 109* 107* 92 97   Cardiac Enzymes: No results for input(s): CKTOTAL, CKMB, CKMBINDEX, TROPONINI in the last 168 hours. No results for input(s): PROBNP in the last 8760 hours. Coagulation Profile: Recent Labs  Lab 09/09/19 0347 09/10/19 0337 09/11/19 0429 09/12/19 0442 09/13/19 0459  INR 2.6* 2.8* 3.1* 2.6* 2.2*   Thyroid Function Tests: No results for input(s): TSH, T4TOTAL, FREET4, T3FREE, THYROIDAB in the last 72 hours. Lipid Profile: No results for input(s): CHOL, HDL, LDLCALC, TRIG, CHOLHDL, LDLDIRECT in the last 72 hours. Anemia Panel: No results for input(s): VITAMINB12, FOLATE, FERRITIN, TIBC, IRON, RETICCTPCT in the last 72 hours. Urine analysis:    Component Value Date/Time   COLORURINE BROWN (A) 08/05/2019 2018   APPEARANCEUR TURBID (A) 08/05/2019 2018   LABSPEC RESULTS UNAVAILABLE DUE TO INTERFERING SUBSTANCE 08/05/2019 2018   PHURINE RESULTS UNAVAILABLE DUE TO INTERFERING SUBSTANCE 08/05/2019 2018   GLUCOSEU RESULTS UNAVAILABLE DUE TO INTERFERING SUBSTANCE (A) 08/05/2019 2018   HGBUR RESULTS UNAVAILABLE DUE TO INTERFERING SUBSTANCE (A) 08/05/2019 2018   BILIRUBINUR RESULTS UNAVAILABLE DUE TO INTERFERING SUBSTANCE (A) 08/05/2019 2018   KETONESUR  RESULTS UNAVAILABLE DUE TO INTERFERING SUBSTANCE (A) 08/05/2019 2018   PROTEINUR RESULTS UNAVAILABLE DUE TO INTERFERING SUBSTANCE (A) 08/05/2019 2018   UROBILINOGEN 0.2 05/07/2009 1520   NITRITE RESULTS UNAVAILABLE DUE TO INTERFERING SUBSTANCE (A) 08/05/2019 2018   LEUKOCYTESUR RESULTS UNAVAILABLE DUE TO INTERFERING SUBSTANCE (A) 08/05/2019 2018   Sepsis Labs: Invalid input(s): PROCALCITONIN, [place]  Microbiology: Recent Results (from the past 240 hour(s))  MRSA PCR Screening     Status: None   Collection Time: 09/12/19  3:42 PM   Specimen: Nasopharyngeal  Result Value Ref Range Status   MRSA by PCR NEGATIVE NEGATIVE Final    Comment:        The GeneXpert MRSA Assay (FDA approved for NASAL specimens only), is one component of a comprehensive MRSA colonization surveillance program. It is not intended to diagnose MRSA infection nor to guide or monitor treatment for MRSA infections. Performed at [facility], [place] [address], [place], [place] [address]     Radiology Studies: No results found.  Zoanne Newill T. Buford  If 7PM-7AM, please contact night-coverage [email or web address] Password [facility] 09/13/2019, 10:34 AM

## 2019-09-13 NOTE — Procedures (Signed)
I was present at this dialysis session. I have reviewed the session itself and made appropriate changes.   Vital signs in last 24 hours:  Temp:  [97.5 F (36.4 C)-98.1 F (36.7 C)] 97.5 F (36.4 C) (12/02 0410) Pulse Rate:  [85-99] 93 (12/02 0410) Resp:  [16-26] 20 (12/02 0410) BP: (126-140)/(69-88) 126/77 (12/02 0410) SpO2:  [95 %-98 %] 97 % (12/02 0410) Weight change:  Filed Weights   09/10/19 0550 09/11/19 0417 09/12/19 0410  Weight: 72 kg 72.9 kg 77.3 kg    Recent Labs  Lab 09/13/19 0459  NA 135  K 5.2*  CL 95*  CO2 25  GLUCOSE 98  BUN 85*  CREATININE 4.32*  CALCIUM 9.1  PHOS 6.1*    Recent Labs  Lab 09/10/19 0337 09/11/19 0429 09/13/19 0459  WBC 12.9* 11.8* 12.8*  NEUTROABS 10.4*  --   --   HGB 8.9* 8.5* 8.3*  HCT 29.0* 27.4* 26.4*  MCV 91.2 91.6 91.0  PLT 264 221 204    Scheduled Meds: .  stroke: mapping our early stages of recovery book   Does not apply Once  . sodium chloride   Intravenous Once  . amiodarone  200 mg Oral Daily  . ARIPiprazole  10 mg Oral BID  . atorvastatin  20 mg Oral Daily  . chlorhexidine  15 mL Mouth Rinse BID  . Chlorhexidine Gluconate Cloth  6 each Topical Q0600  . coumadin book   Does not apply Once  . [START ON 09/14/2019] darbepoetin (ARANESP) injection - DIALYSIS  100 mcg Intravenous Q Thu-HD  . feeding supplement  1 Container Oral TID BM  . feeding supplement (NEPRO CARB STEADY)  237 mL Oral BID BM  . feeding supplement (PRO-STAT SUGAR FREE 64)  30 mL Oral BID  . ferrous sulfate  325 mg Oral Daily  . insulin aspart  0-9 Units Subcutaneous Q4H  . lipase/protease/amylase)  20,880 Units Per Tube Once   And  . sodium bicarbonate  650 mg Per Tube Once  . mouth rinse  15 mL Mouth Rinse BID  . multivitamin  1 tablet Oral QHS  . nystatin  5 mL Oral QID  . pantoprazole  40 mg Oral Daily  . sodium chloride flush  10-40 mL Intracatheter Q12H  . thiamine  100 mg Oral Daily  . Thrombi-Pad  1 each Topical Once  . valproic acid   250 mg Oral Q6H  . warfarin  2 mg Oral ONCE-1800  . Warfarin - Pharmacist Dosing Inpatient   Does not apply q1800   Continuous Infusions: . sodium chloride 10 mL (08/20/19 2025)  . sodium chloride    . sodium chloride    .  ceFAZolin (ANCEF) IV 2 g (09/12/19 2009)   PRN Meds:.sodium chloride, sodium chloride, acetaminophen, alteplase, heparin, hydrALAZINE, HYDROmorphone (DILAUDID) injection, levalbuterol, lidocaine (PF), lidocaine-prilocaine, pentafluoroprop-tetrafluoroeth, sodium chloride flush    Assessment/Plan:  1. AKI/CKD stage 4- in setting of cardiac arrest and bacteremia. Has been dialysis dependent since she started CRRT on 10/30 and has been maintained on IHD on TTS schedule. Palliative care consult however plan is for full scope of care. 2. Vascular access- s/p RIJ TDC on 08/23/19 by IR. Was turned down for avf/avg in past due to debilitated state. 3. MSSA bacteremia- Ancepf through 09/18/19. 4. S/p cardiac arrest- h/o cardiomyopathy with EF 20%. Per cardiology 5. Anemia of CKD- on aranesp 100 mcg every Thursday.  6. Paroxysmal atrial fibrillation- on amiodarone and anticoagulation per cardiology 7. Protein malnutrition-  on tube feeding 8. Dysphagia- presumably due to multiple infarcts possibly embolic. 9. Disposition- denied [facility]. Awaiting LTC placement. She is not appropriate for outpatient dialysis until she can sit in a chair for dialysis and a wheelchair to and from dialysis.  Jaclyn Potts,  MD 09/13/2019, 8:22 AM

## 2019-09-13 NOTE — Progress Notes (Signed)
Occupational Therapy Treatment Patient Details Name: Jaclyn Day MRN: [id number] DOB: Mar 26, 1946 Today's Date: 09/13/2019    History of present illness 73yo female admitted after out of hospital cardiac arrest, unclear etiology CPR started by fire 10 min CPR with 1 Epi before ROSC. PMH includes a-fib, diastolic CHF, Mood disorder, CKD. CRRT initiated on 10/31 for volume removal. Pt transitioned to intermittent HD.    OT comments  Pt making steady progress towards OT goals this session. Session focus on functional transfer training from EOB>recliner via sara stedy. Pt required total - MAX A +2 to complete partial stand for stedy transfer. Pt HR increase to 140s with functional activity.Pt noted to be flexed forward once seated in stedy needing cues to self- correct. Pt completed seated grooming once in recliner needing MIN A to support RUE to brush hair. Noted edema in LUE, left pt with LUE elevated higher than heart to assist with edema management. DC plan remains appropriate, will continue to follow acutely per POC.    Follow Up Recommendations  LTACH    Equipment Recommendations  None recommended by OT    Recommendations for Other Services      Precautions / Restrictions Precautions Precautions: Fall Precaution Comments: BUE and BLE weakness with digit extension limitations noted as well, increased swelling in L UE. Restrictions Weight Bearing Restrictions: No       Mobility Bed Mobility Overal bed mobility: Needs Assistance Bed Mobility: Supine to Sit     Supine to sit: Max assist;+2 for physical assistance     General bed mobility comments: Max +2 to move LEs to edge of bed to elevate trunk into a seated position.  Total +2 to scoot pad to edge of bed while maintaining trunk control.  Placed sara stedy in front of patient sitting edge of bed.  She was able to grip with her R hand and poorly grip with LUE ( very edematous)  Transfers Overall transfer level: Needs  assistance Equipment used: Ambulation equipment used(sara stedy)            Lateral/Scoot Transfers: Max assist;+2 physical assistance;Total assist General transfer comment: Performed partial sit to stand clearing buttocks to place sara stedy plates.  Pt postures to forward flexion resting her head on sara stedy cross bar.  facilitation utilized for upper trunk control.  She performed additonal sit to stand to move plates and sit in recliner.  Poor eccentric loading when returning to a seated position.    Balance Overall balance assessment: Needs assistance Sitting-balance support: Feet supported;Single extremity supported Sitting balance-Leahy Scale: Poor   Postural control: Posterior lean;Left lateral lean   Standing balance-Leahy Scale: Zero Standing balance comment: unable                           ADL either performed or assessed with clinical judgement   ADL Overall ADL's : Needs assistance/impaired     Grooming: Minimal assistance;Sitting;Brushing hair Grooming Details (indicate cue type and reason): MIN A to stabilize elbow while pt briefly brushed hair with RUE; pt fatigues quickly need therapist to complete tasks                 Toilet Transfer: Maximal assistance;Total assistance;+2 for physical assistance;Stand-pivot Toilet Transfer Details (indicate cue type and reason): simulated from EOB>recliner with stedy         Functional mobility during ADLs: Maximal assistance;Total assistance;+2 for physical assistance(transfer to recliner via stedy) General ADL Comments: session focus  on functional transfer training via stedy from EOB>recliner. Pt required MAX- total A for sit>stand from elevated surface. Pt completed seated grooming in chair needing MIN A to support RUE during task. pt with edema in RUE, attempted retrograde massage with pt stating, "I hate that" Left pt with LUE elevated higher than heart to assist with edema managment     Vision  Baseline Vision/History: No visual deficits     Perception     Praxis      Cognition Arousal/Alertness: Awake/alert Behavior During Therapy: Flat affect Overall Cognitive Status: Impaired/Different from baseline Area of Impairment: Attention;Following commands;Safety/judgement;Problem solving                   Current Attention Level: Focused   Following Commands: Follows one step commands with increased time Safety/Judgement: Decreased awareness of deficits   Problem Solving: Slow processing;Decreased initiation;Difficulty sequencing;Requires verbal cues;Requires tactile cues General Comments: Pt is slow to process simple commands but able to with increased time and appears appropriate during session.        Exercises     Shoulder Instructions       General Comments LUE noted to be swollen, elevated LUE on pillow at end of session to asssit with edema management    Pertinent Vitals/ Pain       Pain Assessment: Faces Faces Pain Scale: Hurts even more Pain Location: BUE with movement, BLE with movement Pain Descriptors / Indicators: Grimacing;Guarding Pain Intervention(s): Monitored during session;Repositioned  Home Living                                          Prior Functioning/Environment              Frequency  Min 2X/week        Progress Toward Goals  OT Goals(current goals can now be found in the care plan section)  Progress towards OT goals: Progressing toward goals  Acute Rehab OT Goals Patient Stated Goal: to get cortrak out OT Goal Formulation: With patient/family Time For Goal Achievement: 09/25/19 Potential to Achieve Goals: [place] Discharge plan remains appropriate    Co-evaluation    PT/OT/SLP Co-Evaluation/Treatment: Yes Reason for Co-Treatment: Complexity of the patient's impairments (multi-system involvement);For patient/therapist safety;To address functional/ADL transfers PT goals addressed during  session: Mobility/safety with mobility OT goals addressed during session: ADL's and self-care;Proper use of Adaptive equipment and DME      AM-PAC OT "6 Clicks" Daily Activity     Outcome Measure   Help from another person eating meals?: A Lot Help from another person taking care of personal grooming?: A Lot Help from another person toileting, which includes using toliet, bedpan, or urinal?: Total Help from another person bathing (including washing, rinsing, drying)?: Total Help from another person to put on and taking off regular upper body clothing?: Total Help from another person to put on and taking off regular lower body clothing?: Total 6 Click Score: 8    End of Session    OT Visit Diagnosis: Muscle weakness (generalized) (M62.81);Feeding difficulties (R63.3);Unsteadiness on feet (R26.81);Pain Pain - part of body: Arm   Activity Tolerance Patient tolerated treatment well   Patient Left in chair;with call bell/phone within reach;with chair alarm set   Nurse Communication Mobility status;Need for lift equipment        Time: 1359-1429 OT Time Calculation (min): 30 min  Charges: OT General  Charges $OT Visit: 1 Visit OT Treatments $Self Care/Home Management : 8-22 mins Lanier Clam., COTA/L Acute Rehabilitation Services [phone] [place] 09/13/2019, 3:27 PM

## 2019-09-13 NOTE — Progress Notes (Signed)
ANTICOAGULATION CONSULT NOTE - Follow-Up Consult  Pharmacy Consult for Warfarin Indication: atrial fibrillation  Allergies  Allergen Reactions  . Codeine Other (See Comments)    Reaction not recalled by family- was told to never take this    Patient Measurements: Height: 5\' 6"  (167.6 cm) Weight: 170 lb 6.7 oz (77.3 kg) IBW/kg (Calculated) : 59.3  Vital Signs: Temp: 97.5 F (36.4 C) (12/02 0410) Temp Source: Oral (12/02 0410) BP: 126/77 (12/02 0410) Pulse Rate: 93 (12/02 0410)  Labs: Recent Labs    09/11/19 0429 09/12/19 0442 09/13/19 0459  HGB 8.5*  --  8.3*  HCT 27.4*  --  26.4*  PLT 221  --  204  LABPROT 31.7* 28.1* 24.0*  INR 3.1* 2.6* 2.2*  CREATININE 3.31*  --  4.32*    Estimated Creatinine Clearance: 12.4 mL/min (A) (by C-G formula based on SCr of 4.32 mg/dL (H)).   Medical History: Past Medical History:  Diagnosis Date  . Anemia   . Chronic diastolic CHF (congestive heart failure) ([place])   . CKD (chronic kidney disease), stage IV ([place])   . Hypertension   . Persistent atrial fibrillation ([place])    a. dx 01/2019 s/p TEE DCCV.  Marland Kitchen Renal disorder   . Unilateral congenital absence of kidney    Assessment: Pt is a 73 yr old female admitted s/p out of hospital cardiac arrest and TTM. Admit c/b by encephalopathy, AKI which progressed to HD, and ?embolic CVA. Pt on apixaban PTA for Afib, which was held for IV heparin. Heparin subsequently held on 11/16 due to bleeding from HD lines and low Hgb. Warfarin started 11/18 with no bridge planned. Patient on amiodarone PO   INR today came back therapeutic at 2.2 (held dose on 11/30). Hgb 8.3, plt 204. No s/sx of bleeding. Oral intake documented at 75-100%.   Goal of Therapy:  INR 2-3 Monitor platelets by anticoagulation protocol: Yes   Plan:  Warfarin 2 mg tonight Daily INR  Antonietta Jewel, PharmD, BCCCP Clinical Pharmacist  Phone: [phone]  Please check AMION for all [place] phone numbers After 10:00 PM,  call [place] [phone]

## 2019-09-14 LAB — GLUCOSE, CAPILLARY
Glucose-Capillary: 105 mg/dL — ABNORMAL HIGH (ref 70–99)
Glucose-Capillary: 107 mg/dL — ABNORMAL HIGH (ref 70–99)
Glucose-Capillary: 116 mg/dL — ABNORMAL HIGH (ref 70–99)
Glucose-Capillary: 117 mg/dL — ABNORMAL HIGH (ref 70–99)
Glucose-Capillary: 124 mg/dL — ABNORMAL HIGH (ref 70–99)
Glucose-Capillary: 84 mg/dL (ref 70–99)
Glucose-Capillary: 85 mg/dL (ref 70–99)

## 2019-09-14 LAB — PROTIME-INR
INR: 1.9 — ABNORMAL HIGH (ref 0.8–1.2)
Prothrombin Time: 22.1 seconds — ABNORMAL HIGH (ref 11.4–15.2)

## 2019-09-14 MED ORDER — WARFARIN SODIUM 2 MG PO TABS
2.0000 mg | ORAL_TABLET | Freq: Once | ORAL | Status: AC
  Administered 2019-09-14: 18:00:00 2 mg via ORAL
  Filled 2019-09-14: qty 1

## 2019-09-14 NOTE — Progress Notes (Signed)
Patient ID: Jaclyn Day, female   DOB: 1946-07-20, 73 y.o.   MRN: [id number] S: No new complaints, still confused and doesn't remember dialysis yesterday O:BP (!) 145/85 (BP Location: Left Wrist)   Pulse 97   Temp (!) 97.5 F (36.4 C) (Oral)   Resp 20   Ht 5\' 6"  (1.676 m)   Wt 74.8 kg   SpO2 94%   BMI 26.62 kg/m   Intake/Output Summary (Last 24 hours) at 09/14/2019 1410 Last data filed at 09/14/2019 1229 Gross per 24 hour  Intake 120 ml  Output -  Net 120 ml   Intake/Output: I/O last 3 completed shifts: In: 10 [P.O.:10] Out: 1550 [Urine:550; Other:1000]  Intake/Output this shift:  Total I/O In: 120 [P.O.:120] Out: -  Weight change:  Gen: chronically ill-appearing WF  CVS: no rub Resp: cta Abd: benign Ext: +edema of left arm and leg  Recent Labs  Lab 09/08/19 0315 09/09/19 0347 09/10/19 0337 09/11/19 0429 09/13/19 0459 09/13/19 1745  NA 137 137 136 137 135 136  K 4.7 5.3* 4.0 4.6 5.2* 4.3  CL 96* 95* 98 98 95* 96*  CO2 27 25 28 27 25 27   GLUCOSE 110* 89 140* 113* 98 111*  BUN 46* 69* 27* 52* 85* 30*  CREATININE 3.01* 3.89* 2.17* 3.31* 4.32* 2.10*  ALBUMIN 1.8* 2.0* 1.7* 1.8* 1.8* 1.8*  CALCIUM 9.1 9.5 8.6* 9.2 9.1 8.9  PHOS 4.5 5.7* 3.7 5.0* 6.1* 3.7   Liver Function Tests: Recent Labs  Lab 09/11/19 0429 09/13/19 0459 09/13/19 1745  ALBUMIN 1.8* 1.8* 1.8*   No results for input(s): LIPASE, AMYLASE in the last 168 hours. No results for input(s): AMMONIA in the last 168 hours. CBC: Recent Labs  Lab 09/08/19 0315 09/09/19 0347 09/10/19 0337 09/11/19 0429 09/13/19 0459  WBC 16.3* 14.7* 12.9* 11.8* 12.8*  NEUTROABS  --   --  10.4*  --   --   HGB 8.8* 9.3* 8.9* 8.5* 8.3*  HCT 28.5* 29.7* 29.0* 27.4* 26.4*  MCV 91.3 90.3 91.2 91.6 91.0  PLT 299 270 264 221 204   Cardiac Enzymes: No results for input(s): CKTOTAL, CKMB, CKMBINDEX, TROPONINI in the last 168 hours. CBG: Recent Labs  Lab 09/13/19 2025 09/14/19 0008 09/14/19 0416 09/14/19 0750  09/14/19 1144  GLUCAP 109* 116* 84 85 117*    Iron Studies: No results for input(s): IRON, TIBC, TRANSFERRIN, FERRITIN in the last 72 hours. Studies/Results: No results found. .  stroke: mapping our early stages of recovery book   Does not apply Once  . sodium chloride   Intravenous Once  . amiodarone  200 mg Oral Daily  . ARIPiprazole  10 mg Oral BID  . atorvastatin  20 mg Oral Daily  . chlorhexidine  15 mL Mouth Rinse BID  . Chlorhexidine Gluconate Cloth  6 each Topical Q0600  . coumadin book   Does not apply Once  . darbepoetin (ARANESP) injection - DIALYSIS  100 mcg Intravenous Q Thu-HD  . feeding supplement  1 Container Oral TID BM  . feeding supplement (NEPRO CARB STEADY)  237 mL Oral BID BM  . feeding supplement (PRO-STAT SUGAR FREE 64)  30 mL Oral BID  . ferrous sulfate  325 mg Oral Daily  . insulin aspart  0-9 Units Subcutaneous Q4H  . lipase/protease/amylase)  20,880 Units Per Tube Once   And  . sodium bicarbonate  650 mg Per Tube Once  . mouth rinse  15 mL Mouth Rinse BID  . multivitamin  1 tablet Oral QHS  . pantoprazole  40 mg Oral Daily  . sodium chloride flush  10-40 mL Intracatheter Q12H  . thiamine  100 mg Oral Daily  . Thrombi-Pad  1 each Topical Once  . valproic acid  250 mg Oral Q6H  . warfarin  2 mg Oral ONCE-1800  . Warfarin - Pharmacist Dosing Inpatient   Does not apply q1800    BMET    Component Value Date/Time   NA 136 09/13/2019 1745   K 4.3 09/13/2019 1745   CL 96 (L) 09/13/2019 1745   CO2 27 09/13/2019 1745   GLUCOSE 111 (H) 09/13/2019 1745   BUN 30 (H) 09/13/2019 1745   CREATININE 2.10 (H) 09/13/2019 1745   CALCIUM 8.9 09/13/2019 1745   GFRNONAA 23 (L) 09/13/2019 1745   GFRAA 27 (L) 09/13/2019 1745   CBC    Component Value Date/Time   WBC 12.8 (H) 09/13/2019 0459   RBC 2.90 (L) 09/13/2019 0459   HGB 8.3 (L) 09/13/2019 0459   HCT 26.4 (L) 09/13/2019 0459   HCT 21.4 (L) 08/21/2019 1040   PLT 204 09/13/2019 0459   MCV 91.0  09/13/2019 0459   MCH 28.6 09/13/2019 0459   MCHC 31.4 09/13/2019 0459   RDW 17.5 (H) 09/13/2019 0459   LYMPHSABS 1.1 09/10/2019 0337   MONOABS 1.0 09/10/2019 0337   EOSABS 0.0 09/10/2019 0337   BASOSABS 0.1 09/10/2019 0337    Assessment/Plan:  1. AKI/CKD stage 4- in setting of cardiac arrest and bacteremia. Has been dialysis dependent since she started CRRT on 10/30 and has been maintained on IHD on TTS schedule but was off schedule due to census.  Plan to get back on schedule Saturday. Palliative care consult however plan is for full scope of care. 1. She is not appropriate for outpatient dialysis given her poor functional status and will require LTC placement if she continues to pursue HD. 2. Vascular access- s/p RIJ TDC on 08/23/19 by IR. Was turned down for avf/avg in past due to debilitated state. 3. MSSA bacteremia- Ancepf through 09/18/19. 4. S/p cardiac arrest- h/o cardiomyopathy with EF 20%. Per cardiology 5. Anemia of CKD- on aranesp 100 mcg every Thursday.  6. Paroxysmal atrial fibrillation- on amiodarone and anticoagulation per cardiology 7. Protein malnutrition- on tube feeding 8. Dysphagia- presumably due to multiple infarcts possibly embolic. 9. Disposition- denied [facility]red. Awaiting LTC placement.She is not appropriate for outpatient dialysis until she can sit in a chair for dialysis and a wheelchair to and from dialysis.  Donetta Potts, MD [employer] [phone]

## 2019-09-14 NOTE — Progress Notes (Signed)
Orthopedic Tech Progress Note Patient Details:  Jaclyn Day [birth year] [id number]  Ortho Devices Ortho Device/Splint Location: left carter pillow Ortho Device/Splint Interventions: Application   Post Interventions Patient Tolerated: Well Instructions Provided: Care of device   [name] 09/14/2019, 12:46 PM

## 2019-09-14 NOTE — Progress Notes (Signed)
PROGRESS NOTE  Jaclyn Day [id number] DOB: [birth year]   PCP: Cyndi Bender, PA-C  Patient is from: Home  DOA: 08/03/2019 LOS: 74  Brief Narrative / Interim history: 73 year old female with history of congenital unilateral kidney, CKD-4, HTN, persistent A. fib on Eliquis and diastolic CHF admitted to ICU 08/03/2023 cardiac arrest.  Found to have new systolic CHF with EF of 20 to 25%, global hypokinesis.  Blood culture grew MSSA for which she was started on vancomycin and transition to Ancef on 08/06/2019.  Patient had echocardiogram on 08/21/2019 that showed EF of 55 to 60%, moderate LAE and RVSP to 39.  Hospital course complicated by A. fib with RVR, acute metabolic encephalopathy/agitation and AKI.    Subjective: No major events overnight of this morning.  She has no complaints.  She denies pain anywhere.  She said she had a brief episode of shortness of breath yesterday that has resolved.  Denies cough, nausea, vomiting or abdominal pain.  Objective: Vitals:   09/13/19 2200 09/14/19 0330 09/14/19 0419 09/14/19 0452  BP:   (!) 145/85   Pulse: (!) 116 92 90 97  Resp: 19 20 (!) 29 20  Temp:   (!) 97.5 F (36.4 C)   TempSrc:   Oral   SpO2: 94% 95% 97% 94%  Weight:   74.8 kg   Height:        Intake/Output Summary (Last 24 hours) at 09/14/2019 1055 Last data filed at 09/13/2019 1152 Gross per 24 hour  Intake -  Output 1000 ml  Net -1000 ml   Filed Weights   09/12/19 0410 09/13/19 0730 09/14/19 0419  Weight: 77.3 kg 75.8 kg 74.8 kg    Examination:  GENERAL: No acute distress.  Appears well.  HEENT: MMM.  Vision and hearing grossly intact. Cortrak in place NECK: Supple.  No apparent JVD.  RESP:  No IWOB. Good air movement bilaterally. CVS: RRR.  HR in 90s. Heart sounds normal.  ABD/GI/GU: Bowel sounds present. Soft. Non tender.  MSK/EXT: Weakness pronounced in lower extremities.  Trace LUE edema. SKIN: no apparent skin lesion or wound NEURO: Awake, alert and  oriented appropriately.  CN grossly intact.  Motor 3+/5 in RUE.  3/5 in LUE.  2+/5 in both LEs. PSYCH: Calm. Normal affect.  Assessment & Plan: Acute respiratory failure with hypoxia "multifactorial including cardiac arrest, pulmonary edema, bilateral pleural effusion and anxiety.  Resolved.  She is now on room air. -treat treatable causes.  MSSA bacteremia/AV endocarditis: Bandemia improving.  Remains afebrile.  Leukocytosis improving. -Continue Ancef until 09/18/2019 per ID-total of 6 weeks  Leukocytosis/bandemia: Likely due to the above.  Improving. -Continue trending.  V. tach/cardiac arrest/acute systolic CHF: Initial TTE on 10/23 with EF of 20 to 25%.  Repeat TTE on 11/9 with EF of 55 to 60%.  Currently euvolemic except for stable trace LUE edema. -S/p ETT and hypothermia protocol in ICU -Fluid management by HD.  Acute metabolic encephalopathy: Resolved.  Oriented fairly but limited insight -Frequent reorientation and delirium precautions.  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  Patient with significant upper and lower extremity weakness left greater than right mainly in upper extremities. -Continue warfarin per pharmacy   Persistent A. fib: rate and rhythm controlled. -Continue p.o. amiodarone and warfarin  Anemia: Multifactorial-acute blood loss from HD cath 11/15, chronic disease and malnutrition. -Hgb 11.6 (admit & b/l)>> 6.9 (11/10)>1u> 8.2>> 5.9 (11/16)> 8.8>> 7.6>1u>8.8>> 8.3 -On Aranesp and iron per nephro -Optimize  nutrition -Monitor H&H  AKI/CKD-4: Likely likely due to shock from cardiac issues and bacteremia.  Having fair urine output although I and O incomplete. -Right IJ TDC on 11/11.  Tunneled down for permanent access due to debility -CRRT 10/30-11/1>>IHD  -Continue monitoring-seems to have fair urine output now.  Metabolic acidosis/bone mineral disorder/hyperkalemia: Likely due to renal  failure. -Per nephrology.  Hypotension: Normotensive. -Continue midodrine.  Dysphagia: SLP and dietitian following.   -Tube feed stopped 12/1 per RD-supplements ordered.  May be able to d/c cortrak-will clarify with RD. -Appreciate SLP input-continue dysphagia 1 diet  Oral thrush: Resolved.  Discontinued nystatin  Vitamin D deficiency -On weekly vitamin D 50,000 units  Stage I sacral decubitus ulcer: Not present on admission -Dressing and offloading per RN  Resting LUE tremor: Resolved.  Moderate malnutrition: Nutrition Problem: Moderate Malnutrition Etiology: acute illness(acute respiratory failure, AKI required CRRT, endocarditis)  Signs/Symptoms: mild fat depletion, mild muscle depletion, moderate muscle depletion  Interventions: Tube feeding   DVT prophylaxis: Subcu heparin Code Status: Full code Family Communication: Patient and/or RN. Available if any question. Disposition Plan: Remains inpatient.  Final disposition LTAC or SNF. Turned down by [facility]: Cardiology (signed off), neurology (signed off), IR (signed off), palliative, nephrology  Procedures:  HD cath placement CRRT IHD  Microbiology summarized: 10/22-COVID-19 negative 10/22-MRSA PCR negative 10/22-respiratory viral panel negative 10/22-urine culture with E. Coli 10/22-blood culture with MSSA in 1/2 bottles 10/26, 10/29, 11/16 and 11/20-blood cultures negative  Sch Meds:  Scheduled Meds: .  stroke: mapping our early stages of recovery book   Does not apply Once  . sodium chloride   Intravenous Once  . amiodarone  200 mg Oral Daily  . ARIPiprazole  10 mg Oral BID  . atorvastatin  20 mg Oral Daily  . chlorhexidine  15 mL Mouth Rinse BID  . Chlorhexidine Gluconate Cloth  6 each Topical Q0600  . coumadin book   Does not apply Once  . darbepoetin (ARANESP) injection - DIALYSIS  100 mcg Intravenous Q Thu-HD  . feeding supplement  1 Container Oral TID BM  . feeding supplement (NEPRO CARB  STEADY)  237 mL Oral BID BM  . feeding supplement (PRO-STAT SUGAR FREE 64)  30 mL Oral BID  . ferrous sulfate  325 mg Oral Daily  . insulin aspart  0-9 Units Subcutaneous Q4H  . lipase/protease/amylase)  20,880 Units Per Tube Once   And  . sodium bicarbonate  650 mg Per Tube Once  . mouth rinse  15 mL Mouth Rinse BID  . multivitamin  1 tablet Oral QHS  . pantoprazole  40 mg Oral Daily  . sodium chloride flush  10-40 mL Intracatheter Q12H  . thiamine  100 mg Oral Daily  . Thrombi-Pad  1 each Topical Once  . valproic acid  250 mg Oral Q6H  . warfarin  2 mg Oral ONCE-1800  . Warfarin - Pharmacist Dosing Inpatient   Does not apply q1800   Continuous Infusions: . sodium chloride 10 mL (08/20/19 2025)  . sodium chloride    . sodium chloride    .  ceFAZolin (ANCEF) IV 2 g (09/12/19 2009)   PRN Meds:.sodium chloride, sodium chloride, acetaminophen, alteplase, heparin, hydrALAZINE, HYDROmorphone (DILAUDID) injection, levalbuterol, lidocaine (PF), lidocaine-prilocaine, pentafluoroprop-tetrafluoroeth, sodium chloride flush  Antimicrobials: Anti-infectives (From admission, onward)   Start     Dose/Rate Route Frequency Ordered Stop   09/09/19 1800  ceFAZolin (ANCEF) IVPB 2g/100 mL premix     2 g 200 mL/hr over 30  Minutes Intravenous Every T-Th-Sa (1800) 09/05/19 1024     09/04/19 1800  ceFAZolin (ANCEF) IVPB 2g/100 mL premix     2 g 200 mL/hr over 30 Minutes Intravenous Every M-W-F (1800) 09/04/19 1335 09/06/19 1736   08/31/19 1800  ceFAZolin (ANCEF) IVPB 2g/100 mL premix  Status:  Discontinued     2 g 200 mL/hr over 30 Minutes Intravenous Every T-Th-Sa (1800) 08/31/19 1401 09/04/19 1335   08/30/19 0900  ceFAZolin (ANCEF) IVPB 2g/100 mL premix     2 g 200 mL/hr over 30 Minutes Intravenous  Once 08/30/19 0854 08/30/19 1134   08/24/19 1200  ceFAZolin (ANCEF) IVPB 2g/100 mL premix  Status:  Discontinued     2 g 200 mL/hr over 30 Minutes Intravenous Every T-Th-Sa (Hemodialysis) 08/23/19 0807  08/31/19 1401   08/20/19 2200  ceFAZolin (ANCEF) IVPB 1 g/50 mL premix  Status:  Discontinued     1 g 100 mL/hr over 30 Minutes Intravenous Daily at bedtime 08/20/19 1507 08/23/19 0807   08/15/19 2200  ceFAZolin (ANCEF) IVPB 2g/100 mL premix  Status:  Discontinued     2 g 200 mL/hr over 30 Minutes Intravenous Daily at bedtime 08/15/19 1133 08/20/19 1507   08/11/19 1800  ceFAZolin (ANCEF) IVPB 2g/100 mL premix  Status:  Discontinued     2 g 200 mL/hr over 30 Minutes Intravenous Every 12 hours 08/11/19 1110 08/15/19 1133   08/10/19 1600  ceFAZolin (ANCEF) IVPB 1 g/50 mL premix  Status:  Discontinued     1 g 100 mL/hr over 30 Minutes Intravenous Every 12 hours 08/10/19 1530 08/11/19 1110   08/06/19 1800  ceFAZolin (ANCEF) IVPB 1 g/50 mL premix  Status:  Discontinued     1 g 100 mL/hr over 30 Minutes Intravenous Every 24 hours 08/06/19 1025 08/10/19 1530   08/05/19 1530  vancomycin (VANCOCIN) 1,750 mg in sodium chloride 0.9 % 500 mL IVPB     1,750 mg 250 mL/hr over 120 Minutes Intravenous  Once 08/05/19 1444 08/05/19 1736   08/05/19 1443  vancomycin variable dose per unstable renal function (pharmacist dosing)  Status:  Discontinued      Does not apply See admin instructions 08/05/19 1444 08/06/19 1025   08/03/19 1300  cefTRIAXone (ROCEPHIN) 2 g in sodium chloride 0.9 % 100 mL IVPB  Status:  Discontinued     2 g 200 mL/hr over 30 Minutes Intravenous Every 24 hours 08/03/19 1251 08/06/19 1025       I have personally reviewed the following labs and images: CBC: Recent Labs  Lab 09/08/19 0315 09/09/19 0347 09/10/19 0337 09/11/19 0429 09/13/19 0459  WBC 16.3* 14.7* 12.9* 11.8* 12.8*  NEUTROABS  --   --  10.4*  --   --   HGB 8.8* 9.3* 8.9* 8.5* 8.3*  HCT 28.5* 29.7* 29.0* 27.4* 26.4*  MCV 91.3 90.3 91.2 91.6 91.0  PLT 299 270 264 221 204   BMP &GFR Recent Labs  Lab 09/08/19 0315 09/09/19 0347 09/10/19 0337 09/11/19 0429 09/13/19 0459 09/13/19 1745  NA 137 137 136 137 135  136  K 4.7 5.3* 4.0 4.6 5.2* 4.3  CL 96* 95* 98 98 95* 96*  CO2 27 25 28 27 25 27   GLUCOSE 110* 89 140* 113* 98 111*  BUN 46* 69* 27* 52* 85* 30*  CREATININE 3.01* 3.89* 2.17* 3.31* 4.32* 2.10*  CALCIUM 9.1 9.5 8.6* 9.2 9.1 8.9  MG 2.1 2.2 1.9 2.0  --   --   PHOS 4.5 5.7* 3.7  5.0* 6.1* 3.7   Estimated Creatinine Clearance: 25 mL/min (A) (by C-G formula based on SCr of 2.1 mg/dL (H)). Liver & Pancreas: Recent Labs  Lab 09/09/19 0347 09/10/19 0337 09/11/19 0429 09/13/19 0459 09/13/19 1745  ALBUMIN 2.0* 1.7* 1.8* 1.8* 1.8*   No results for input(s): LIPASE, AMYLASE in the last 168 hours. No results for input(s): AMMONIA in the last 168 hours. Diabetic: No results for input(s): HGBA1C in the last 72 hours. Recent Labs  Lab 09/13/19 1630 09/13/19 2025 09/14/19 0008 09/14/19 0416 09/14/19 0750  GLUCAP 116* 109* 116* 84 85   Cardiac Enzymes: No results for input(s): CKTOTAL, CKMB, CKMBINDEX, TROPONINI in the last 168 hours. No results for input(s): PROBNP in the last 8760 hours. Coagulation Profile: Recent Labs  Lab 09/10/19 0337 09/11/19 0429 09/12/19 0442 09/13/19 0459 09/14/19 0406  INR 2.8* 3.1* 2.6* 2.2* 1.9*   Thyroid Function Tests: No results for input(s): TSH, T4TOTAL, FREET4, T3FREE, THYROIDAB in the last 72 hours. Lipid Profile: No results for input(s): CHOL, HDL, LDLCALC, TRIG, CHOLHDL, LDLDIRECT in the last 72 hours. Anemia Panel: No results for input(s): VITAMINB12, FOLATE, FERRITIN, TIBC, IRON, RETICCTPCT in the last 72 hours. Urine analysis:    Component Value Date/Time   COLORURINE BROWN (A) 08/05/2019 2018   APPEARANCEUR TURBID (A) 08/05/2019 2018   LABSPEC RESULTS UNAVAILABLE DUE TO INTERFERING SUBSTANCE 08/05/2019 2018   PHURINE RESULTS UNAVAILABLE DUE TO INTERFERING SUBSTANCE 08/05/2019 2018   GLUCOSEU RESULTS UNAVAILABLE DUE TO INTERFERING SUBSTANCE (A) 08/05/2019 2018   HGBUR RESULTS UNAVAILABLE DUE TO INTERFERING SUBSTANCE (A) 08/05/2019  2018   BILIRUBINUR RESULTS UNAVAILABLE DUE TO INTERFERING SUBSTANCE (A) 08/05/2019 2018   KETONESUR RESULTS UNAVAILABLE DUE TO INTERFERING SUBSTANCE (A) 08/05/2019 2018   PROTEINUR RESULTS UNAVAILABLE DUE TO INTERFERING SUBSTANCE (A) 08/05/2019 2018   UROBILINOGEN 0.2 05/07/2009 1520   NITRITE RESULTS UNAVAILABLE DUE TO INTERFERING SUBSTANCE (A) 08/05/2019 2018   LEUKOCYTESUR RESULTS UNAVAILABLE DUE TO INTERFERING SUBSTANCE (A) 08/05/2019 2018   Sepsis Labs: Invalid input(s): PROCALCITONIN, [place]  Microbiology: Recent Results (from the past 240 hour(s))  MRSA PCR Screening     Status: None   Collection Time: 09/12/19  3:42 PM   Specimen: Nasopharyngeal  Result Value Ref Range Status   MRSA by PCR NEGATIVE NEGATIVE Final    Comment:        The GeneXpert MRSA Assay (FDA approved for NASAL specimens only), is one component of a comprehensive MRSA colonization surveillance program. It is not intended to diagnose MRSA infection nor to guide or monitor treatment for MRSA infections. Performed at [facility], [place] [address], [place], [place] [address]     Radiology Studies: No results found.   T. [place]  If 7PM-7AM, please contact night-coverage [email or web address] Password [email or web address] 09/14/2019, 10:55 AM

## 2019-09-14 NOTE — Progress Notes (Signed)
ANTICOAGULATION CONSULT NOTE - Follow-Up Consult  Pharmacy Consult for Warfarin Indication: atrial fibrillation  Allergies  Allergen Reactions  . Codeine Other (See Comments)    Reaction not recalled by family- was told to never take this    Patient Measurements: Height: 5\' 6"  (167.6 cm) Weight: 164 lb 14.5 oz (74.8 kg) IBW/kg (Calculated) : 59.3  Vital Signs: Temp: 97.5 F (36.4 C) (12/03 0419) Temp Source: Oral (12/03 0419) BP: 145/85 (12/03 0419) Pulse Rate: 97 (12/03 0452)  Labs: Recent Labs    09/12/19 0442 09/13/19 0459 09/13/19 1745 09/14/19 0406  HGB  --  8.3*  --   --   HCT  --  26.4*  --   --   PLT  --  204  --   --   LABPROT 28.1* 24.0*  --  22.1*  INR 2.6* 2.2*  --  1.9*  CREATININE  --  4.32* 2.10*  --     Estimated Creatinine Clearance: 25 mL/min (A) (by C-G formula based on SCr of 2.1 mg/dL (H)).   Medical History: Past Medical History:  Diagnosis Date  . Anemia   . Chronic diastolic CHF (congestive heart failure) ([place])   . CKD (chronic kidney disease), stage IV ([place])   . Hypertension   . Persistent atrial fibrillation ([place])    a. dx 01/2019 s/p TEE DCCV.  Marland Kitchen Renal disorder   . Unilateral congenital absence of kidney    Assessment: Pt is a 73 yr old female admitted s/p out of hospital cardiac arrest and TTM. Admit c/b by encephalopathy, AKI which progressed to HD, and ?embolic CVA. Pt on apixaban PTA for Afib, which was held for IV heparin. Heparin subsequently held on 11/16 due to bleeding from HD lines and low Hgb. Warfarin started 11/18 with no bridge planned. Patient on amiodarone PO  -INR today= 1.9 (held dose on 11/30).    Goal of Therapy:  INR 2-3 Monitor platelets by anticoagulation protocol: Yes   Plan:  Warfarin 2 mg tonight Daily INR  Hildred Laser, PharmD Clinical Pharmacist **Pharmacist phone directory can now be found on [email or web address] (PW [email or web address]).  Listed under [place].

## 2019-09-15 LAB — RENAL FUNCTION PANEL
Albumin: 1.8 g/dL — ABNORMAL LOW (ref 3.5–5.0)
Anion gap: 12 (ref 5–15)
BUN: 68 mg/dL — ABNORMAL HIGH (ref 8–23)
CO2: 25 mmol/L (ref 22–32)
Calcium: 9.2 mg/dL (ref 8.9–10.3)
Chloride: 99 mmol/L (ref 98–111)
Creatinine, Ser: 3.69 mg/dL — ABNORMAL HIGH (ref 0.44–1.00)
GFR calc Af Amer: 13 mL/min — ABNORMAL LOW (ref >60)
GFR calc non Af Amer: 12 mL/min — ABNORMAL LOW (ref >60)
Glucose, Bld: 102 mg/dL — ABNORMAL HIGH (ref 70–99)
Phosphorus: 5.9 mg/dL — ABNORMAL HIGH (ref 2.5–4.6)
Potassium: 4.4 mmol/L (ref 3.5–5.1)
Sodium: 136 mmol/L (ref 135–145)

## 2019-09-15 LAB — GLUCOSE, CAPILLARY
Glucose-Capillary: 105 mg/dL — ABNORMAL HIGH (ref 70–99)
Glucose-Capillary: 113 mg/dL — ABNORMAL HIGH (ref 70–99)
Glucose-Capillary: 153 mg/dL — ABNORMAL HIGH (ref 70–99)
Glucose-Capillary: 102 mg/dL — ABNORMAL HIGH (ref 70–99)
Glucose-Capillary: 108 mg/dL — ABNORMAL HIGH (ref 70–99)

## 2019-09-15 LAB — CBC
HCT: 26.7 % — ABNORMAL LOW (ref 36.0–46.0)
Hemoglobin: 8.4 g/dL — ABNORMAL LOW (ref 12.0–15.0)
MCH: 29.1 pg (ref 26.0–34.0)
MCHC: 31.5 g/dL (ref 30.0–36.0)
MCV: 92.4 fL (ref 80.0–100.0)
Platelets: 224 10*3/uL (ref 150–400)
RBC: 2.89 MIL/uL — ABNORMAL LOW (ref 3.87–5.11)
RDW: 17.8 % — ABNORMAL HIGH (ref 11.5–15.5)
WBC: 13.3 10*3/uL — ABNORMAL HIGH (ref 4.0–10.5)
nRBC: 0 % (ref 0.0–0.2)

## 2019-09-15 LAB — PROTIME-INR
INR: 2 — ABNORMAL HIGH (ref 0.8–1.2)
Prothrombin Time: 22.9 seconds — ABNORMAL HIGH (ref 11.4–15.2)

## 2019-09-15 MED ORDER — WARFARIN SODIUM 2 MG PO TABS
2.0000 mg | ORAL_TABLET | Freq: Once | ORAL | Status: AC
  Administered 2019-09-15: 2 mg via ORAL
  Filled 2019-09-15: qty 1

## 2019-09-15 MED ORDER — DARBEPOETIN ALFA 100 MCG/0.5ML IJ SOSY
100.0000 ug | PREFILLED_SYRINGE | INTRAMUSCULAR | Status: DC
  Administered 2019-09-16 – 2019-09-23 (×2): 100 ug via INTRAVENOUS
  Filled 2019-09-15 (×3): qty 0.5

## 2019-09-15 NOTE — Progress Notes (Signed)
ANTICOAGULATION CONSULT NOTE - Follow-Up Consult  Pharmacy Consult for Warfarin Indication: atrial fibrillation  Allergies  Allergen Reactions  . Codeine Other (See Comments)    Reaction not recalled by family- was told to never take this    Patient Measurements: Height: 5\' 6"  (167.6 cm) Weight: 166 lb 0.1 oz (75.3 kg) IBW/kg (Calculated) : 59.3  Vital Signs: Temp: 97.8 F (36.6 C) (12/04 0322) Temp Source: Oral (12/04 0322) BP: 134/85 (12/04 0322) Pulse Rate: 105 (12/04 0618)  Labs: Recent Labs    09/13/19 0459 09/13/19 1745 09/14/19 0406 09/15/19 0438  HGB 8.3*  --   --  8.4*  HCT 26.4*  --   --  26.7*  PLT 204  --   --  224  LABPROT 24.0*  --  22.1* 22.9*  INR 2.2*  --  1.9* 2.0*  CREATININE 4.32* 2.10*  --   --     Estimated Creatinine Clearance: 25.1 mL/min (A) (by C-G formula based on SCr of 2.1 mg/dL (H)).   Medical History: Past Medical History:  Diagnosis Date  . Anemia   . Chronic diastolic CHF (congestive heart failure) ([place])   . CKD (chronic kidney disease), stage IV ([place])   . Hypertension   . Persistent atrial fibrillation ([place])    a. dx 01/2019 s/p TEE DCCV.  Marland Kitchen Renal disorder   . Unilateral congenital absence of kidney    Assessment: Pt is a 73 yr old female admitted s/p out of hospital cardiac arrest and TTM. Admit c/b by encephalopathy, AKI which progressed to HD, and ?embolic CVA. Pt on apixaban PTA for Afib, which was held for IV heparin. Heparin subsequently held on 11/16 due to bleeding from HD lines and low Hgb. Warfarin started 11/18 with no bridge planned. Patient on amiodarone PO  -INR today= 2.0 (held dose on 11/30).    Goal of Therapy:  INR 2-3 Monitor platelets by anticoagulation protocol: Yes   Plan:  Warfarin 2 mg tonight Daily INR  Hildred Laser, PharmD Clinical Pharmacist **Pharmacist phone directory can now be found on [email or web address] ([email or web address]).  Listed under [place].

## 2019-09-15 NOTE — Progress Notes (Signed)
  Speech Language Pathology Treatment: Dysphagia;Cognitive-Linquistic  Patient Details Name: Jaclyn Day MRN: [id number] DOB: May 23, 1946 Today's Date: 09/15/2019 Time: [id number] SLP Time Calculation (min) (ACUTE ONLY): 9 min  Assessment / Plan / Recommendation Clinical Impression  SWALLOWING Pt tolerated thin liquid by straw, puree, ground, and soft solid consistencies with no clinical s/s of aspiration.  With simulated ground consistency, oral phase was more efficient than with regular solid.  Pt was noted to [place] lingual sweep independently on occasion.  Pt benefited from verbal cues to clear residue.  Pt benefited from liquid wash as well.  Oral residue predominantly in R buccal cavity.  Pt endorsed that consuming ground consistency was more effortful and tiring than puree.  Pt required SLP cuing to achieve full oral clearance.  Pt continues to demonstrate improvement, and may benefit from a trial tray of ground texture prior to advancing diet to see how she can tolerate over the course of a meal.   COGNITION Pt alert and participative throughout today's session.  Pt reports improvement in speech.  Pt was 100% intelligible.  Speech was slow and responses minimally delayed but appropriate.  Pt does note that she feels she is having difficulty remembering things.  With functional delayed recall task (grocery list), pt recalled 2/4 items independently, one additional with semantic cue, and final item with multiple choice cue at 3 minutes.  Pt recalled 2 of 4 items independently at 5 minutes, but also named other items that had not been discussed.   Pt needed increased cuing for recall at greater delay.    HPI HPI: Pt is a 73 yo F admitted for witnessed out of hospital cardiac arrest, unclear etiology, with field ROSC, resultant respiratory failure and encephalopathy s/p TTM; ETT 10/22-11/1. CRRT initiated 10/30. PMH: unilateral congenital absence of kidney, Afib, HTN, CKD, CHF, anemia, mood  disorder.  MRI on 11/7 revealed "Several scattered small acute infarcts in the white matter of the superior frontal lobes, left occipital lobe. No associated hemorrhage or mass effect. 2. Several small subacute or perhaps chronic infarcts in the bilateral cerebellum."      SLP Plan  Continue with current plan of care       Recommendations  Diet recommendations: Dysphagia 1 (puree);Thin liquid Liquids provided via: Cup;Straw Medication Administration: Crushed with puree Supervision: Staff to assist with self feeding;Full supervision/cueing for compensatory strategies Compensations: Minimize environmental distractions;Slow rate;Small sips/bites Postural Changes and/or Swallow Maneuvers: Seated upright 90 degrees                Oral Care Recommendations: Oral care BID Follow up Recommendations: Skilled Nursing facility SLP Visit Diagnosis: Dysphagia, oropharyngeal phase (R13.12) Plan: Continue with current plan of care       [place], [place], [place] Office: [phone] 09/15/2019, 11:38 AM

## 2019-09-15 NOTE — TOC Progression Note (Signed)
Transition of Care [facility]) - Progression Note    Patient Details  Name: Jaclyn Day MRN: [id number] Date of Birth: 11/18/1945  Transition of Care [facility]) CM/SW [place], LCSW Phone Number: 09/15/2019, 3:26 PM  Clinical Narrative:  CSW spoke with [name] and he is agreeable to SNF however, he only wants her to go to a facility if he can go see her. Unfortunately with visitation policies right now most facilities are not allowing inside visits. CSW reached out to each facility that made a bed offer.  Pt's son prefers facilities in [place] and out of the offers-- [facility] has no visitation right now and [facility] has window visits after 14 day isolation period.  CSW attempted to reach [name] to update however got no answer. CSW left voicemail.        Expected Discharge Plan: Long Term Acute Care (LTAC) Barriers to Discharge: Continued Medical Work up  Expected Discharge Plan and Services Expected Discharge Plan: [place] (LTAC) In-house Referral: NA Discharge Planning Services: CM Consult Post Acute Care Choice: [place] Living arrangements for the past 2 months: Single Family Home                                       Social Determinants of Health (SDOH) Interventions    Readmission Risk Interventions Readmission Risk Prevention Plan 08/08/2019  Transportation Screening Complete  PCP or Specialist Appt within 3-5 Days Complete  HRI or Home Care Consult Complete  Medication Review (RN Care Manager) Complete  Some recent data might be hidden

## 2019-09-15 NOTE — Plan of Care (Signed)
  Problem: Nutrition: Goal: Adequate nutrition will be maintained Outcome: Progressing   

## 2019-09-15 NOTE — Progress Notes (Signed)
PROGRESS NOTE  Jaclyn Day [id number] DOB: 1946-03-23   PCP: Cyndi Bender, PA-C  Patient is from: Home  DOA: 08/03/2019 LOS: 86  Brief Narrative / Interim history: 73 year old female with history of congenital unilateral kidney, CKD-4, HTN, persistent A. fib on Eliquis and diastolic CHF admitted to ICU 08/03/2023 cardiac arrest.  Found to have new systolic CHF with EF of 20 to 25%, global hypokinesis.  Blood culture grew MSSA for which she was started on vancomycin and transition to Ancef on 08/06/2019.  Patient had echocardiogram on 08/21/2019 that showed EF of 55 to 60%, moderate LAE and RVSP to 39.  Hospital course complicated by A. fib with RVR, acute metabolic encephalopathy/agitation and AKI.    Subjective: No major events overnight of this morning.  No complaint this morning.  She denies chest pain, dyspnea, nausea, vomiting or abdominal pain. RN reports brief chocking this morning.   Objective: Vitals:   09/15/19 0615 09/15/19 0617 09/15/19 0618 09/15/19 1418  BP:    (!) 106/59  Pulse: (!) 104 96 (!) 105 (!) 119  Resp: (!) 21 20 20    Temp:    98.2 F (36.8 C)  TempSrc:    Oral  SpO2: 92% 92% 93% 95%  Weight:      Height:        Intake/Output Summary (Last 24 hours) at 09/15/2019 1502 Last data filed at 09/15/2019 0853 Gross per 24 hour  Intake 100 ml  Output -  Net 100 ml   Filed Weights   09/13/19 0730 09/14/19 0419 09/15/19 0322  Weight: 75.8 kg 74.8 kg 75.3 kg    Examination:  GENERAL: No acute distress.  Appears well.  HEENT: MMM.  Vision and hearing grossly intact.  NECK: Supple.  No apparent JVD.  RESP:  No IWOB. Good air movement bilaterally. CVS: IR.  HR in 100s on monitor. Heart sounds normal.  ABD/GI/GU: Bowel sounds present. Soft. Non tender.  MSK/EXT: Generalized weakness more pronounced in LEs. SKIN: Stage I sacral pressure injury. NEURO: Awake, alert and oriented appropriately.  CN grossly intact.  Motor 3+/5 in RUE.  3/5 in LUE.   2+/5 in both LEs. PSYCH: Calm. Normal affect.  Assessment & Plan: AKI/CKD-4: due to shock from cardiac issues and bacteremia.  Makes fair amount of urine. -Right IJ TDC on 11/11.  Turned down for permanent access due to debility -CRRT 10/30-11/1>>IHD TTS -Nephrology managing.  Metabolic acidosis/bone mineral disorder/hyperkalemia: Likely due to renal failure. -Per nephrology.  Acute respiratory failure with hypoxia "multifactorial including cardiac arrest, pulmonary edema, bilateral pleural effusion and anxiety.  Resolved.  She is now on room air. -treat treatable causes.  MSSA bacteremia/AV endocarditis: Bandemia improving.  Remains afebrile.  Leukocytosis improving. -Continue Ancef until 09/18/2019 per ID-total of 6 weeks  Leukocytosis/bandemia: Likely due to the above.  Improving. -Continue trending.  V. tach/cardiac arrest/acute systolic CHF: Initial TTE on 10/23 with EF of 20 to 25%.  Repeat TTE on 11/9 with EF of 55 to 60%.  Currently euvolemic except for stable trace LUE edema. -S/p ETT and hypothermia protocol in ICU -Fluid management by HD.  Acute metabolic encephalopathy: Resolved.  Oriented fairly but limited insight -Frequent reorientation and delirium precautions.  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  Patient with significant upper and lower extremity weakness left greater than right mainly in upper extremities. -Continue warfarin per pharmacy   Persistent A. fib: rate and rhythm controlled. -Continue p.o. amiodarone and warfarin  Anemia: Multifactorial-acute blood loss from HD cath 11/15, chronic disease and malnutrition. -Hgb 11.6 (admit & b/l)>> 6.9 (11/10)>1u> 8.2>> 5.9 (11/16)> 8.8>> 7.6>1u>8.8>> 8.4 -On Aranesp and iron per nephro -Optimize nutrition -Monitor H&H  Hypotension: Normotensive. -Continue midodrine.  Dysphagia: SLP and dietitian following.   -Tube feed stopped 12/1 per  RD-supplements ordered.  May be able to d/c cortrak-will clarify with RD. -Appreciate SLP input-continue dysphagia 1 diet  Oral thrush: Resolved.  Discontinued nystatin  Vitamin D deficiency -On weekly vitamin D 50,000 units  Stage I sacral decubitus ulcer: Not present on admission -Dressing and offloading per RN  Resting LUE tremor: Resolved.  Moderate malnutrition: Nutrition Problem: Moderate Malnutrition Etiology: acute illness(acute respiratory failure, AKI required CRRT, endocarditis)  Signs/Symptoms: mild fat depletion, mild muscle depletion, moderate muscle depletion  Interventions: Tube feeding   DVT prophylaxis: Subcu heparin Code Status: Full code Family Communication: Patient and/or RN. Available if any question. Disposition Plan: Remains inpatient.  Final disposition LTAC or SNF. Turned down by [facility]: Cardiology (signed off), neurology (signed off), IR (signed off), palliative, nephrology  Procedures:  HD cath placement CRRT IHD  Microbiology summarized: 10/22-COVID-19 negative 10/22-MRSA PCR negative 10/22-respiratory viral panel negative 10/22-urine culture with E. Coli 10/22-blood culture with MSSA in 1/2 bottles 10/26, 10/29, 11/16 and 11/20-blood cultures negative  Sch Meds:  Scheduled Meds: .  stroke: mapping our early stages of recovery book   Does not apply Once  . sodium chloride   Intravenous Once  . amiodarone  200 mg Oral Daily  . ARIPiprazole  10 mg Oral BID  . atorvastatin  20 mg Oral Daily  . chlorhexidine  15 mL Mouth Rinse BID  . Chlorhexidine Gluconate Cloth  6 each Topical Q0600  . coumadin book   Does not apply Once  . [START ON 09/16/2019] darbepoetin (ARANESP) injection - DIALYSIS  100 mcg Intravenous Q Sat-HD  . feeding supplement  1 Container Oral TID BM  . feeding supplement (NEPRO CARB STEADY)  237 mL Oral BID BM  . feeding supplement (PRO-STAT SUGAR FREE 64)  30 mL Oral BID  . ferrous sulfate  325 mg Oral Daily   . insulin aspart  0-9 Units Subcutaneous Q4H  . lipase/protease/amylase)  20,880 Units Per Tube Once   And  . sodium bicarbonate  650 mg Per Tube Once  . mouth rinse  15 mL Mouth Rinse BID  . multivitamin  1 tablet Oral QHS  . pantoprazole  40 mg Oral Daily  . sodium chloride flush  10-40 mL Intracatheter Q12H  . thiamine  100 mg Oral Daily  . Thrombi-Pad  1 each Topical Once  . valproic acid  250 mg Oral Q6H  . warfarin  2 mg Oral ONCE-1800  . Warfarin - Pharmacist Dosing Inpatient   Does not apply q1800   Continuous Infusions: . sodium chloride 10 mL (08/20/19 2025)  . sodium chloride    . sodium chloride    .  ceFAZolin (ANCEF) IV 2 g (09/14/19 1830)   PRN Meds:.sodium chloride, sodium chloride, acetaminophen, alteplase, heparin, hydrALAZINE, HYDROmorphone (DILAUDID) injection, levalbuterol, lidocaine (PF), lidocaine-prilocaine, pentafluoroprop-tetrafluoroeth, sodium chloride flush  Antimicrobials: Anti-infectives (From admission, onward)   Start     Dose/Rate Route Frequency Ordered Stop   09/09/19 1800  ceFAZolin (ANCEF) IVPB 2g/100 mL premix     2 g 200 mL/hr over 30 Minutes Intravenous Every T-Th-Sa (1800) 09/05/19 1024     09/04/19 1800  ceFAZolin (ANCEF) IVPB 2g/100 mL premix  2 g 200 mL/hr over 30 Minutes Intravenous Every M-W-F (1800) 09/04/19 1335 09/06/19 1736   08/31/19 1800  ceFAZolin (ANCEF) IVPB 2g/100 mL premix  Status:  Discontinued     2 g 200 mL/hr over 30 Minutes Intravenous Every T-Th-Sa (1800) 08/31/19 1401 09/04/19 1335   08/30/19 0900  ceFAZolin (ANCEF) IVPB 2g/100 mL premix     2 g 200 mL/hr over 30 Minutes Intravenous  Once 08/30/19 0854 08/30/19 1134   08/24/19 1200  ceFAZolin (ANCEF) IVPB 2g/100 mL premix  Status:  Discontinued     2 g 200 mL/hr over 30 Minutes Intravenous Every T-Th-Sa (Hemodialysis) 08/23/19 0807 08/31/19 1401   08/20/19 2200  ceFAZolin (ANCEF) IVPB 1 g/50 mL premix  Status:  Discontinued     1 g 100 mL/hr over 30 Minutes  Intravenous Daily at bedtime 08/20/19 1507 08/23/19 0807   08/15/19 2200  ceFAZolin (ANCEF) IVPB 2g/100 mL premix  Status:  Discontinued     2 g 200 mL/hr over 30 Minutes Intravenous Daily at bedtime 08/15/19 1133 08/20/19 1507   08/11/19 1800  ceFAZolin (ANCEF) IVPB 2g/100 mL premix  Status:  Discontinued     2 g 200 mL/hr over 30 Minutes Intravenous Every 12 hours 08/11/19 1110 08/15/19 1133   08/10/19 1600  ceFAZolin (ANCEF) IVPB 1 g/50 mL premix  Status:  Discontinued     1 g 100 mL/hr over 30 Minutes Intravenous Every 12 hours 08/10/19 1530 08/11/19 1110   08/06/19 1800  ceFAZolin (ANCEF) IVPB 1 g/50 mL premix  Status:  Discontinued     1 g 100 mL/hr over 30 Minutes Intravenous Every 24 hours 08/06/19 1025 08/10/19 1530   08/05/19 1530  vancomycin (VANCOCIN) 1,750 mg in sodium chloride 0.9 % 500 mL IVPB     1,750 mg 250 mL/hr over 120 Minutes Intravenous  Once 08/05/19 1444 08/05/19 1736   08/05/19 1443  vancomycin variable dose per unstable renal function (pharmacist dosing)  Status:  Discontinued      Does not apply See admin instructions 08/05/19 1444 08/06/19 1025   08/03/19 1300  cefTRIAXone (ROCEPHIN) 2 g in sodium chloride 0.9 % 100 mL IVPB  Status:  Discontinued     2 g 200 mL/hr over 30 Minutes Intravenous Every 24 hours 08/03/19 1251 08/06/19 1025       I have personally reviewed the following labs and images: CBC: Recent Labs  Lab 09/09/19 0347 09/10/19 0337 09/11/19 0429 09/13/19 0459 09/15/19 0438  WBC 14.7* 12.9* 11.8* 12.8* 13.3*  NEUTROABS  --  10.4*  --   --   --   HGB 9.3* 8.9* 8.5* 8.3* 8.4*  HCT 29.7* 29.0* 27.4* 26.4* 26.7*  MCV 90.3 91.2 91.6 91.0 92.4  PLT 270 264 221 204 224   BMP &GFR Recent Labs  Lab 09/09/19 0347 09/10/19 0337 09/11/19 0429 09/13/19 0459 09/13/19 1745  NA 137 136 137 135 136  K 5.3* 4.0 4.6 5.2* 4.3  CL 95* 98 98 95* 96*  CO2 25 28 27 25 27   GLUCOSE 89 140* 113* 98 111*  BUN 69* 27* 52* 85* 30*  CREATININE 3.89*  2.17* 3.31* 4.32* 2.10*  CALCIUM 9.5 8.6* 9.2 9.1 8.9  MG 2.2 1.9 2.0  --   --   PHOS 5.7* 3.7 5.0* 6.1* 3.7   Estimated Creatinine Clearance: 25.1 mL/min (A) (by C-G formula based on SCr of 2.1 mg/dL (H)). Liver & Pancreas: Recent Labs  Lab 09/09/19 0347 09/10/19 [id number] 09/11/19 0429 09/13/19 0459  09/13/19 1745  ALBUMIN 2.0* 1.7* 1.8* 1.8* 1.8*   No results for input(s): LIPASE, AMYLASE in the last 168 hours. No results for input(s): AMMONIA in the last 168 hours. Diabetic: No results for input(s): HGBA1C in the last 72 hours. Recent Labs  Lab 09/14/19 2042 09/14/19 2354 09/15/19 0413 09/15/19 0734 09/15/19 1123  GLUCAP 105* 107* 105* 113* 153*   Cardiac Enzymes: No results for input(s): CKTOTAL, CKMB, CKMBINDEX, TROPONINI in the last 168 hours. No results for input(s): PROBNP in the last 8760 hours. Coagulation Profile: Recent Labs  Lab 09/11/19 0429 09/12/19 0442 09/13/19 0459 09/14/19 0406 09/15/19 0438  INR 3.1* 2.6* 2.2* 1.9* 2.0*   Thyroid Function Tests: No results for input(s): TSH, T4TOTAL, FREET4, T3FREE, THYROIDAB in the last 72 hours. Lipid Profile: No results for input(s): CHOL, HDL, LDLCALC, TRIG, CHOLHDL, LDLDIRECT in the last 72 hours. Anemia Panel: No results for input(s): VITAMINB12, FOLATE, FERRITIN, TIBC, IRON, RETICCTPCT in the last 72 hours. Urine analysis:    Component Value Date/Time   COLORURINE BROWN (A) 08/05/2019 2018   APPEARANCEUR TURBID (A) 08/05/2019 2018   LABSPEC RESULTS UNAVAILABLE DUE TO INTERFERING SUBSTANCE 08/05/2019 2018   PHURINE RESULTS UNAVAILABLE DUE TO INTERFERING SUBSTANCE 08/05/2019 2018   GLUCOSEU RESULTS UNAVAILABLE DUE TO INTERFERING SUBSTANCE (A) 08/05/2019 2018   HGBUR RESULTS UNAVAILABLE DUE TO INTERFERING SUBSTANCE (A) 08/05/2019 2018   BILIRUBINUR RESULTS UNAVAILABLE DUE TO INTERFERING SUBSTANCE (A) 08/05/2019 2018   KETONESUR RESULTS UNAVAILABLE DUE TO INTERFERING SUBSTANCE (A) 08/05/2019 2018    PROTEINUR RESULTS UNAVAILABLE DUE TO INTERFERING SUBSTANCE (A) 08/05/2019 2018   UROBILINOGEN 0.2 05/07/2009 1520   NITRITE RESULTS UNAVAILABLE DUE TO INTERFERING SUBSTANCE (A) 08/05/2019 2018   LEUKOCYTESUR RESULTS UNAVAILABLE DUE TO INTERFERING SUBSTANCE (A) 08/05/2019 2018   Sepsis Labs: Invalid input(s): PROCALCITONIN, [place]  Microbiology: Recent Results (from the past 240 hour(s))  MRSA PCR Screening     Status: None   Collection Time: 09/12/19  3:42 PM   Specimen: Nasopharyngeal  Result Value Ref Range Status   MRSA by PCR NEGATIVE NEGATIVE Final    Comment:        The GeneXpert MRSA Assay (FDA approved for NASAL specimens only), is one component of a comprehensive MRSA colonization surveillance program. It is not intended to diagnose MRSA infection nor to guide or monitor treatment for MRSA infections. Performed at [facility], [place] [address] [place], [place] [address]     Radiology Studies: No results found.   T. [place]  If 7PM-7AM, please contact night-coverage [email or web address] Password [email or web address] 09/15/2019, 3:02 PM

## 2019-09-15 NOTE — Progress Notes (Signed)
Patient ID: Jaclyn Day, female   DOB: 02-Jun-1946, 73 y.o.   MRN: [id number]  [facility] Progress Note    Assessment/ Plan:   1. Acute kidney Injury on chronic kidney disease stage IV: Appears consistent with ATN following cardiac arrest and MSSA bacteremia.  Without evidence of renal recovery and remains dependent on intermittent hemodialysis however, overall functional status is poor as is her musculoskeletal conditioning at this time to allow for prolonged sitting in recliner for hemodialysis.  Currently getting hemodialysis via right IJ TDC on a TTS schedule-we will order for this again tomorrow and attempt dialysis in recliner as she makes more progress with PT. 2.  MSSA bacteremia/sepsis: Remains on intravenous Ancef, source suspected to be from cellulitis of the lower extremities. 3.  Status post cardiac arrest 4.  History of paroxysmal atrial fibrillation: Intermittent tachycardia noted, remains on amiodarone with anticoagulation per cardiology. 5.  Protein calorie malnutrition/deconditioning following acute illness: Anticipate prolonged rehabilitation course and may need LTAC (reportedly declined for admission).  Subjective:   No acute events overnight, she reports to be "feeling fine"   Objective:   BP 134/85 (BP Location: Left Arm)   Pulse (!) 105   Temp 97.8 F (36.6 C) (Oral)   Resp 20   Ht 5\' 6"  (1.676 m)   Wt 75.3 kg   SpO2 93%   BMI 26.79 kg/m   Intake/Output Summary (Last 24 hours) at 09/15/2019 1200 Last data filed at 09/15/2019 [id number] Gross per 24 hour  Intake 200 ml  Output -  Net 200 ml   Weight change: -0.5 kg  Physical Exam: Gen: Comfortably resting in bed, family at bedside CVS: Pulse irregular tachycardia, S1 and S2 normal Resp: Coarse breath sounds bilaterally without distinct rales/wheeze Abd: Soft, obese, nontender Ext: Trace-1+ dependent/lower extremity edema  Imaging: No results found.  Labs: BMET Recent Labs  Lab  09/09/19 0347 09/10/19 0337 09/11/19 0429 09/13/19 0459 09/13/19 1745  NA 137 136 137 135 136  K 5.3* 4.0 4.6 5.2* 4.3  CL 95* 98 98 95* 96*  CO2 25 28 27 25 27   GLUCOSE 89 140* 113* 98 111*  BUN 69* 27* 52* 85* 30*  CREATININE 3.89* 2.17* 3.31* 4.32* 2.10*  CALCIUM 9.5 8.6* 9.2 9.1 8.9  PHOS 5.7* 3.7 5.0* 6.1* 3.7   CBC Recent Labs  Lab 09/10/19 0337 09/11/19 0429 09/13/19 0459 09/15/19 0438  WBC 12.9* 11.8* 12.8* 13.3*  NEUTROABS 10.4*  --   --   --   HGB 8.9* 8.5* 8.3* 8.4*  HCT 29.0* 27.4* 26.4* 26.7*  MCV 91.2 91.6 91.0 92.4  PLT 264 221 204 224    Medications:    .  stroke: mapping our early stages of recovery book   Does not apply Once  . sodium chloride   Intravenous Once  . amiodarone  200 mg Oral Daily  . ARIPiprazole  10 mg Oral BID  . atorvastatin  20 mg Oral Daily  . chlorhexidine  15 mL Mouth Rinse BID  . Chlorhexidine Gluconate Cloth  6 each Topical Q0600  . coumadin book   Does not apply Once  . [START ON 09/16/2019] darbepoetin (ARANESP) injection - DIALYSIS  100 mcg Intravenous Q Sat-HD  . feeding supplement  1 Container Oral TID BM  . feeding supplement (NEPRO CARB STEADY)  237 mL Oral BID BM  . feeding supplement (PRO-STAT SUGAR FREE 64)  30 mL Oral BID  . ferrous sulfate  325 mg Oral Daily  .  insulin aspart  0-9 Units Subcutaneous Q4H  . lipase/protease/amylase)  20,880 Units Per Tube Once   And  . sodium bicarbonate  650 mg Per Tube Once  . mouth rinse  15 mL Mouth Rinse BID  . multivitamin  1 tablet Oral QHS  . pantoprazole  40 mg Oral Daily  . sodium chloride flush  10-40 mL Intracatheter Q12H  . thiamine  100 mg Oral Daily  . Thrombi-Pad  1 each Topical Once  . valproic acid  250 mg Oral Q6H  . warfarin  2 mg Oral ONCE-1800  . Warfarin - Pharmacist Dosing Inpatient   Does not apply [id number]   Elmarie Shiley, MD 09/15/2019, 12:00 PM

## 2019-09-16 DIAGNOSIS — F259 Schizoaffective disorder, unspecified: Secondary | ICD-10-CM

## 2019-09-16 DIAGNOSIS — F89 Unspecified disorder of psychological development: Secondary | ICD-10-CM

## 2019-09-16 DIAGNOSIS — F329 Major depressive disorder, single episode, unspecified: Secondary | ICD-10-CM

## 2019-09-16 DIAGNOSIS — F419 Anxiety disorder, unspecified: Secondary | ICD-10-CM

## 2019-09-16 LAB — RENAL FUNCTION PANEL
Albumin: 1.7 g/dL — ABNORMAL LOW (ref 3.5–5.0)
Anion gap: 14 (ref 5–15)
BUN: 78 mg/dL — ABNORMAL HIGH (ref 8–23)
CO2: 26 mmol/L (ref 22–32)
Calcium: 9.2 mg/dL (ref 8.9–10.3)
Chloride: 99 mmol/L (ref 98–111)
Creatinine, Ser: 3.93 mg/dL — ABNORMAL HIGH (ref 0.44–1.00)
GFR calc Af Amer: 12 mL/min — ABNORMAL LOW (ref >60)
GFR calc non Af Amer: 11 mL/min — ABNORMAL LOW (ref >60)
Glucose, Bld: 96 mg/dL (ref 70–99)
Phosphorus: 6.7 mg/dL — ABNORMAL HIGH (ref 2.5–4.6)
Potassium: 4.3 mmol/L (ref 3.5–5.1)
Sodium: 139 mmol/L (ref 135–145)

## 2019-09-16 LAB — GLUCOSE, CAPILLARY
Glucose-Capillary: 152 mg/dL — ABNORMAL HIGH (ref 70–99)
Glucose-Capillary: 84 mg/dL (ref 70–99)
Glucose-Capillary: 85 mg/dL (ref 70–99)
Glucose-Capillary: 86 mg/dL (ref 70–99)
Glucose-Capillary: 88 mg/dL (ref 70–99)

## 2019-09-16 LAB — CBC
HCT: 25.6 % — ABNORMAL LOW (ref 36.0–46.0)
Hemoglobin: 7.8 g/dL — ABNORMAL LOW (ref 12.0–15.0)
MCH: 28.7 pg (ref 26.0–34.0)
MCHC: 30.5 g/dL (ref 30.0–36.0)
MCV: 94.1 fL (ref 80.0–100.0)
Platelets: 229 10*3/uL (ref 150–400)
RBC: 2.72 MIL/uL — ABNORMAL LOW (ref 3.87–5.11)
RDW: 18.1 % — ABNORMAL HIGH (ref 11.5–15.5)
WBC: 9.5 10*3/uL (ref 4.0–10.5)
nRBC: 0 % (ref 0.0–0.2)

## 2019-09-16 LAB — PROTIME-INR
INR: 2.3 — ABNORMAL HIGH (ref 0.8–1.2)
Prothrombin Time: 24.8 seconds — ABNORMAL HIGH (ref 11.4–15.2)

## 2019-09-16 MED ORDER — HEPARIN SODIUM (PORCINE) 1000 UNIT/ML IJ SOLN
INTRAMUSCULAR | Status: AC
  Administered 2019-09-16: 3200 [IU] via INTRAVENOUS_CENTRAL
  Filled 2019-09-16: qty 4

## 2019-09-16 MED ORDER — WARFARIN SODIUM 2 MG PO TABS
2.0000 mg | ORAL_TABLET | Freq: Once | ORAL | Status: AC
  Administered 2019-09-16: 18:00:00 2 mg via ORAL
  Filled 2019-09-16: qty 1

## 2019-09-16 MED ORDER — DARBEPOETIN ALFA 100 MCG/0.5ML IJ SOSY
PREFILLED_SYRINGE | INTRAMUSCULAR | Status: AC
  Filled 2019-09-16: qty 0.5

## 2019-09-16 MED ORDER — HEPARIN SODIUM (PORCINE) 1000 UNIT/ML IJ SOLN
INTRAMUSCULAR | Status: AC
  Administered 2019-09-16: 3000 [IU] via INTRAVENOUS_CENTRAL
  Filled 2019-09-16: qty 3

## 2019-09-16 MED ORDER — HEPARIN SODIUM (PORCINE) 1000 UNIT/ML DIALYSIS
40.0000 [IU]/kg | INTRAMUSCULAR | Status: DC | PRN
  Administered 2019-09-16: 07:00:00 3000 [IU] via INTRAVENOUS_CENTRAL
  Filled 2019-09-16: qty 3

## 2019-09-16 NOTE — Plan of Care (Signed)

## 2019-09-16 NOTE — Procedures (Signed)
Patient seen on Hemodialysis. BP (!) 100/40   Pulse 84   Temp 97.9 F (36.6 C) (Oral)   Resp (!) 21   Ht 5\' 6"  (1.676 m)   Wt 75.9 kg   SpO2 95%   BMI 27.01 kg/m   QB 400, UF goal 3L Tolerating treatment without complaints at this time.   Elmarie Shiley MD [facility]. Office # [phone] Pager # [phone] 10:35 AM

## 2019-09-16 NOTE — Progress Notes (Signed)
ANTICOAGULATION CONSULT NOTE - Follow-Up Consult  Pharmacy Consult for Warfarin Indication: atrial fibrillation  Allergies  Allergen Reactions  . Codeine Other (See Comments)    Reaction not recalled by family- was told to never take this    Patient Measurements: Height: 5\' 6"  (167.6 cm) Weight: 167 lb 5.3 oz (75.9 kg) IBW/kg (Calculated) : 59.3  Vital Signs: Temp: 97.9 F (36.6 C) (12/05 0645) Temp Source: Oral (12/05 0645) BP: 100/40 (12/05 1000) Pulse Rate: 84 (12/05 1000)  Labs: Recent Labs    09/13/19 1745 09/14/19 0406 09/15/19 0438 09/15/19 1647 09/16/19 0347 09/16/19 0728  HGB  --   --  8.4*  --   --  7.8*  HCT  --   --  26.7*  --   --  25.6*  PLT  --   --  224  --   --  229  LABPROT  --  22.1* 22.9*  --  24.8*  --   INR  --  1.9* 2.0*  --  2.3*  --   CREATININE 2.10*  --   --  3.69*  --  3.93*    Estimated Creatinine Clearance: 13.5 mL/min (A) (by C-G formula based on SCr of 3.93 mg/dL (H)).   Medical History: Past Medical History:  Diagnosis Date  . Anemia   . Chronic diastolic CHF (congestive heart failure) ([place])   . CKD (chronic kidney disease), stage IV ([place])   . Hypertension   . Persistent atrial fibrillation ([place])    a. dx 01/2019 s/p TEE DCCV.  Marland Kitchen Renal disorder   . Unilateral congenital absence of kidney    Assessment: Pt is a 73 yr old female admitted s/p out of hospital cardiac arrest and TTM. Admit c/b by encephalopathy, AKI which progressed to HD, and ?embolic CVA. Pt on apixaban PTA for Afib, which was held for IV heparin. Heparin subsequently held on 11/16 due to bleeding from HD lines and low Hgb. Warfarin started 11/18 with no bridge planned. Patient on amiodarone PO   -Today's INR 2.3  Goal of Therapy:  INR 2-3 Monitor platelets by anticoagulation protocol: Yes   Plan:  Warfarin 2 mg again tonight Daily INR  Marguerite Olea, [name] Clinical Pharmacist Phone [phone]  09/16/2019 2:19 PM

## 2019-09-16 NOTE — Progress Notes (Addendum)
PROGRESS NOTE  Jaclyn Day [id number] DOB: December 25, 1945   PCP: Cyndi Bender, PA-C  Patient is from: Home  DOA: 08/03/2019 LOS: 66  Brief Narrative / Interim history: 73 year old female with history of congenital unilateral kidney, CKD-4, HTN, persistent A. fib on Eliquis and diastolic CHF, depression/anxiety/schizoaffective disorder and developmental disorder admitted to ICU 08/03/2023 cardiac arrest.  Found to have new systolic CHF with EF of 20 to 25%, global hypokinesis.  Blood culture grew MSSA for which she was started on vancomycin and transition to Ancef on 08/06/2019.  Patient had echocardiogram on 08/21/2019 that showed EF of 55 to 60%, moderate LAE and RVSP to 39.  Hospital course complicated by A. fib with RVR, acute metabolic encephalopathy/agitation and AKI.    Subjective: No major events overnight of this morning.  No complaint this morning.  She denies chest pain, dyspnea, GI or new focal neuro symptoms.  Reportedly had 100% of her lunch with the help of a hospital yesterday. Cortrak removed.   Objective: Vitals:   09/16/19 0830 09/16/19 0900 09/16/19 0930 09/16/19 1000  BP: (!) 96/45 (!) 83/52 (!) 93/42 (!) 100/40  Pulse: 80 95 86 84  Resp:      Temp:      TempSrc:      SpO2:      Weight:      Height:        Intake/Output Summary (Last 24 hours) at 09/16/2019 1156 Last data filed at 09/15/2019 2100 Gross per 24 hour  Intake 154.59 ml  Output 400 ml  Net -245.41 ml   Filed Weights   09/14/19 0419 09/15/19 0322 09/16/19 0645  Weight: 74.8 kg 75.3 kg 75.9 kg    Examination:  GENERAL: No acute distress.  Appears well.  HEENT: MMM.  Vision and hearing grossly intact.  NECK: Supple.  No apparent JVD.  RESP:  No IWOB. Good air movement bilaterally. CVS: IR.  HR in 110s. Heart sounds normal.  ABD/GI/GU: Bowel sounds present. Soft. Non tender.  MSK/EXT:  Moves extremities. No apparent deformity or edema.  SKIN: Stage I sacral pressure injury NEURO:  Awake, alert and oriented appropriately.  CN grossly intact.  Motor 3+/5 in RUE.  3/5 in LUE.  2+/5 in both LEs. PSYCH: Calm. Normal affect.  Assessment & Plan: AKI/CKD-4: due to shock from cardiac issues and bacteremia.  Makes fair amount of urine. -Right IJ TDC on 11/11.  Turned down for permanent access due to debility -CRRT 10/30-11/1>>IHD TTS -Monitor urine output -Nephrology managing.  Metabolic acidosis/bone mineral disorder/hyperkalemia: due to renal failure. -Nephrology managing  Acute respiratory failure with hypoxia "multifactorial including cardiac arrest, pulmonary edema, bilateral pleural effusion and anxiety.  Resolved.  She is now on room air. -treat treatable causes.  MSSA bacteremia/AV endocarditis: Bandemia improving.  Remains afebrile.  Leukocytosis improving. -Continue Ancef until 09/18/2019 per ID-total of 6 weeks  Leukocytosis/bandemia: Likely due to the above.  Improving. -Continue trending.  V. tach/cardiac arrest/acute systolic CHF: Initial TTE on 10/23 with EF of 20 to 25%.  Repeat TTE on 11/9 with EF of 55 to 60%.  Currently euvolemic except for stable trace LUE edema. -S/p ETT and hypothermia protocol in ICU -Fluid management by HD.  Acute metabolic encephalopathy: Resolved.  Oriented fairly but limited insight -Frequent reorientation and delirium precautions.  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  Patient with significant upper and lower extremity weakness left greater than right mainly in upper extremities. -Continue warfarin per  pharmacy   Persistent A. fib: HR in 110s. -Continue p.o. amiodarone and warfarin -May consider adding metoprolol or bisoprolol for better rate control when BP allows  Anemia: Multifactorial-acute blood loss from HD cath 11/15, chronic disease and malnutrition. -Hgb 11.6 (admit & b/l)>> 6.9 (11/10)>1u> 8.2>> 5.9 (11/16)> 8.8>> 7.6>1u>8.8>> 8.4 -On Aranesp  and iron per nephro -Optimize nutrition -Monitor H&H  Hypotension: Slightly hypotensive after hemodialysis but not symptomatic. -Off midodrine.  Dysphagia: SLP and dietitian following.   -Appreciate SLP input-continue dysphagia 1 diet -Cortrak removed 12/4. -Needs assistance with feeding.  Oral thrush: Resolved.   Vitamin D deficiency -On weekly vitamin D 50,000 units  Stage I sacral decubitus ulcer: Not present on admission -Dressing and offloading per RN  Anxiety/depression/schizoaffective disorder/developmental disorder -Continue Depakote and Abilify -Cymbalta has been on hold.  Moderate malnutrition: Nutrition Problem: Moderate Malnutrition Etiology: acute illness(acute respiratory failure, AKI required CRRT, endocarditis)  Signs/Symptoms: mild fat depletion, mild muscle depletion, moderate muscle depletion  Interventions: Supplements and assistance with feeding.   DVT prophylaxis: Subcu heparin Code Status: Full code Family Communication: Patient and/or RN. Available if any question. Disposition Plan: Remains inpatient.  Final disposition LTAC or SNF. Turned down by [facility]: Cardiology (signed off), neurology (signed off), IR (signed off), palliative, nephrology  Procedures:  HD cath placement CRRT IHD  Microbiology summarized: 10/22-COVID-19 negative 10/22-MRSA PCR negative 10/22-respiratory viral panel negative 10/22-urine culture with E. Coli 10/22-blood culture with MSSA in 1/2 bottles 10/26, 10/29, 11/16 and 11/20-blood cultures negative  Sch Meds:  Scheduled Meds: .  stroke: mapping our early stages of recovery book   Does not apply Once  . sodium chloride   Intravenous Once  . amiodarone  200 mg Oral Daily  . ARIPiprazole  10 mg Oral BID  . atorvastatin  20 mg Oral Daily  . chlorhexidine  15 mL Mouth Rinse BID  . Chlorhexidine Gluconate Cloth  6 each Topical Q0600  . coumadin book   Does not apply Once  . darbepoetin (ARANESP)  injection - DIALYSIS  100 mcg Intravenous Q Sat-HD  . feeding supplement  1 Container Oral TID BM  . feeding supplement (NEPRO CARB STEADY)  237 mL Oral BID BM  . feeding supplement (PRO-STAT SUGAR FREE 64)  30 mL Oral BID  . ferrous sulfate  325 mg Oral Daily  . insulin aspart  0-9 Units Subcutaneous Q4H  . mouth rinse  15 mL Mouth Rinse BID  . multivitamin  1 tablet Oral QHS  . pantoprazole  40 mg Oral Daily  . sodium chloride flush  10-40 mL Intracatheter Q12H  . thiamine  100 mg Oral Daily  . Thrombi-Pad  1 each Topical Once  . valproic acid  250 mg Oral Q6H  . Warfarin - Pharmacist Dosing Inpatient   Does not apply q1800   Continuous Infusions: . sodium chloride 10 mL (08/20/19 2025)  . sodium chloride    . sodium chloride    .  ceFAZolin (ANCEF) IV Stopped (09/14/19 1923)   PRN Meds:.sodium chloride, sodium chloride, acetaminophen, alteplase, heparin, HYDROmorphone (DILAUDID) injection, levalbuterol, lidocaine (PF), lidocaine-prilocaine, pentafluoroprop-tetrafluoroeth, sodium chloride flush  Antimicrobials: Anti-infectives (From admission, onward)   Start     Dose/Rate Route Frequency Ordered Stop   09/09/19 1800  ceFAZolin (ANCEF) IVPB 2g/100 mL premix     2 g 200 mL/hr over 30 Minutes Intravenous Every T-Th-Sa (1800) 09/05/19 1024     09/04/19 1800  ceFAZolin (ANCEF) IVPB 2g/100 mL premix     2 g  200 mL/hr over 30 Minutes Intravenous Every M-W-F (1800) 09/04/19 1335 09/06/19 1736   08/31/19 1800  ceFAZolin (ANCEF) IVPB 2g/100 mL premix  Status:  Discontinued     2 g 200 mL/hr over 30 Minutes Intravenous Every T-Th-Sa (1800) 08/31/19 1401 09/04/19 1335   08/30/19 0900  ceFAZolin (ANCEF) IVPB 2g/100 mL premix     2 g 200 mL/hr over 30 Minutes Intravenous  Once 08/30/19 0854 08/30/19 1134   08/24/19 1200  ceFAZolin (ANCEF) IVPB 2g/100 mL premix  Status:  Discontinued     2 g 200 mL/hr over 30 Minutes Intravenous Every T-Th-Sa (Hemodialysis) 08/23/19 0807 08/31/19 1401    08/20/19 2200  ceFAZolin (ANCEF) IVPB 1 g/50 mL premix  Status:  Discontinued     1 g 100 mL/hr over 30 Minutes Intravenous Daily at bedtime 08/20/19 1507 08/23/19 0807   08/15/19 2200  ceFAZolin (ANCEF) IVPB 2g/100 mL premix  Status:  Discontinued     2 g 200 mL/hr over 30 Minutes Intravenous Daily at bedtime 08/15/19 1133 08/20/19 1507   08/11/19 1800  ceFAZolin (ANCEF) IVPB 2g/100 mL premix  Status:  Discontinued     2 g 200 mL/hr over 30 Minutes Intravenous Every 12 hours 08/11/19 1110 08/15/19 1133   08/10/19 1600  ceFAZolin (ANCEF) IVPB 1 g/50 mL premix  Status:  Discontinued     1 g 100 mL/hr over 30 Minutes Intravenous Every 12 hours 08/10/19 1530 08/11/19 1110   08/06/19 1800  ceFAZolin (ANCEF) IVPB 1 g/50 mL premix  Status:  Discontinued     1 g 100 mL/hr over 30 Minutes Intravenous Every 24 hours 08/06/19 1025 08/10/19 1530   08/05/19 1530  vancomycin (VANCOCIN) 1,750 mg in sodium chloride 0.9 % 500 mL IVPB     1,750 mg 250 mL/hr over 120 Minutes Intravenous  Once 08/05/19 1444 08/05/19 1736   08/05/19 1443  vancomycin variable dose per unstable renal function (pharmacist dosing)  Status:  Discontinued      Does not apply See admin instructions 08/05/19 1444 08/06/19 1025   08/03/19 1300  cefTRIAXone (ROCEPHIN) 2 g in sodium chloride 0.9 % 100 mL IVPB  Status:  Discontinued     2 g 200 mL/hr over 30 Minutes Intravenous Every 24 hours 08/03/19 1251 08/06/19 1025       I have personally reviewed the following labs and images: CBC: Recent Labs  Lab 09/10/19 0337 09/11/19 0429 09/13/19 0459 09/15/19 0438 09/16/19 0728  WBC 12.9* 11.8* 12.8* 13.3* 9.5  NEUTROABS 10.4*  --   --   --   --   HGB 8.9* 8.5* 8.3* 8.4* 7.8*  HCT 29.0* 27.4* 26.4* 26.7* 25.6*  MCV 91.2 91.6 91.0 92.4 94.1  PLT 264 221 204 224 229   BMP &GFR Recent Labs  Lab 09/10/19 0337 09/11/19 0429 09/13/19 0459 09/13/19 1745 09/15/19 1647 09/16/19 0728  NA 136 137 135 136 136 139  K 4.0 4.6 5.2*  4.3 4.4 4.3  CL 98 98 95* 96* 99 99  CO2 28 27 25 27 25 26   GLUCOSE 140* 113* 98 111* 102* 96  BUN 27* 52* 85* 30* 68* 78*  CREATININE 2.17* 3.31* 4.32* 2.10* 3.69* 3.93*  CALCIUM 8.6* 9.2 9.1 8.9 9.2 9.2  MG 1.9 2.0  --   --   --   --   PHOS 3.7 5.0* 6.1* 3.7 5.9* 6.7*   Estimated Creatinine Clearance: 13.5 mL/min (A) (by C-G formula based on SCr of 3.93 mg/dL (H)). Liver &  Pancreas: Recent Labs  Lab 09/11/19 0429 09/13/19 0459 09/13/19 1745 09/15/19 1647 09/16/19 0728  ALBUMIN 1.8* 1.8* 1.8* 1.8* 1.7*   No results for input(s): LIPASE, AMYLASE in the last 168 hours. No results for input(s): AMMONIA in the last 168 hours. Diabetic: No results for input(s): HGBA1C in the last 72 hours. Recent Labs  Lab 09/15/19 1622 09/15/19 2204 09/16/19 0047 09/16/19 0426 09/16/19 1110  GLUCAP 102* 108* 86 88 85   Cardiac Enzymes: No results for input(s): CKTOTAL, CKMB, CKMBINDEX, TROPONINI in the last 168 hours. No results for input(s): PROBNP in the last 8760 hours. Coagulation Profile: Recent Labs  Lab 09/12/19 0442 09/13/19 0459 09/14/19 0406 09/15/19 0438 09/16/19 0347  INR 2.6* 2.2* 1.9* 2.0* 2.3*   Thyroid Function Tests: No results for input(s): TSH, T4TOTAL, FREET4, T3FREE, THYROIDAB in the last 72 hours. Lipid Profile: No results for input(s): CHOL, HDL, LDLCALC, TRIG, CHOLHDL, LDLDIRECT in the last 72 hours. Anemia Panel: No results for input(s): VITAMINB12, FOLATE, FERRITIN, TIBC, IRON, RETICCTPCT in the last 72 hours. Urine analysis:    Component Value Date/Time   COLORURINE BROWN (A) 08/05/2019 2018   APPEARANCEUR TURBID (A) 08/05/2019 2018   LABSPEC RESULTS UNAVAILABLE DUE TO INTERFERING SUBSTANCE 08/05/2019 2018   PHURINE RESULTS UNAVAILABLE DUE TO INTERFERING SUBSTANCE 08/05/2019 2018   GLUCOSEU RESULTS UNAVAILABLE DUE TO INTERFERING SUBSTANCE (A) 08/05/2019 2018   HGBUR RESULTS UNAVAILABLE DUE TO INTERFERING SUBSTANCE (A) 08/05/2019 2018   BILIRUBINUR  RESULTS UNAVAILABLE DUE TO INTERFERING SUBSTANCE (A) 08/05/2019 2018   KETONESUR RESULTS UNAVAILABLE DUE TO INTERFERING SUBSTANCE (A) 08/05/2019 2018   PROTEINUR RESULTS UNAVAILABLE DUE TO INTERFERING SUBSTANCE (A) 08/05/2019 2018   UROBILINOGEN 0.2 05/07/2009 1520   NITRITE RESULTS UNAVAILABLE DUE TO INTERFERING SUBSTANCE (A) 08/05/2019 2018   LEUKOCYTESUR RESULTS UNAVAILABLE DUE TO INTERFERING SUBSTANCE (A) 08/05/2019 2018   Sepsis Labs: Invalid input(s): PROCALCITONIN, [place]  Microbiology: Recent Results (from the past 240 hour(s))  MRSA PCR Screening     Status: None   Collection Time: 09/12/19  3:42 PM   Specimen: Nasopharyngeal  Result Value Ref Range Status   MRSA by PCR NEGATIVE NEGATIVE Final    Comment:        The GeneXpert MRSA Assay (FDA approved for NASAL specimens only), is one component of a comprehensive MRSA colonization surveillance program. It is not intended to diagnose MRSA infection nor to guide or monitor treatment for MRSA infections. Performed at [facility], [place] [address] [place], [place] [address]     Radiology Studies: No results found.  Taye T. Peabody  If 7PM-7AM, please contact night-coverage [email or web address] Password [email or web address] 09/16/2019, 11:56 AM

## 2019-09-17 LAB — RENAL FUNCTION PANEL
Albumin: 1.8 g/dL — ABNORMAL LOW (ref 3.5–5.0)
Anion gap: 14 (ref 5–15)
BUN: 40 mg/dL — ABNORMAL HIGH (ref 8–23)
CO2: 27 mmol/L (ref 22–32)
Calcium: 9.2 mg/dL (ref 8.9–10.3)
Chloride: 97 mmol/L — ABNORMAL LOW (ref 98–111)
Creatinine, Ser: 2.42 mg/dL — ABNORMAL HIGH (ref 0.44–1.00)
GFR calc Af Amer: 22 mL/min — ABNORMAL LOW (ref >60)
GFR calc non Af Amer: 19 mL/min — ABNORMAL LOW (ref >60)
Glucose, Bld: 106 mg/dL — ABNORMAL HIGH (ref 70–99)
Phosphorus: 4.6 mg/dL (ref 2.5–4.6)
Potassium: 4.1 mmol/L (ref 3.5–5.1)
Sodium: 138 mmol/L (ref 135–145)

## 2019-09-17 LAB — CBC
HCT: 27 % — ABNORMAL LOW (ref 36.0–46.0)
Hemoglobin: 8.3 g/dL — ABNORMAL LOW (ref 12.0–15.0)
MCH: 28.2 pg (ref 26.0–34.0)
MCHC: 30.7 g/dL (ref 30.0–36.0)
MCV: 91.8 fL (ref 80.0–100.0)
Platelets: 239 10*3/uL (ref 150–400)
RBC: 2.94 MIL/uL — ABNORMAL LOW (ref 3.87–5.11)
RDW: 17.9 % — ABNORMAL HIGH (ref 11.5–15.5)
WBC: 10 10*3/uL (ref 4.0–10.5)
nRBC: 0 % (ref 0.0–0.2)

## 2019-09-17 LAB — GLUCOSE, CAPILLARY
Glucose-Capillary: 101 mg/dL — ABNORMAL HIGH (ref 70–99)
Glucose-Capillary: 104 mg/dL — ABNORMAL HIGH (ref 70–99)
Glucose-Capillary: 128 mg/dL — ABNORMAL HIGH (ref 70–99)
Glucose-Capillary: 129 mg/dL — ABNORMAL HIGH (ref 70–99)
Glucose-Capillary: 155 mg/dL — ABNORMAL HIGH (ref 70–99)
Glucose-Capillary: 76 mg/dL (ref 70–99)
Glucose-Capillary: 97 mg/dL (ref 70–99)

## 2019-09-17 LAB — PROTIME-INR
INR: 1.9 — ABNORMAL HIGH (ref 0.8–1.2)
Prothrombin Time: 21.5 seconds — ABNORMAL HIGH (ref 11.4–15.2)

## 2019-09-17 LAB — MAGNESIUM: Magnesium: 1.8 mg/dL (ref 1.7–2.4)

## 2019-09-17 MED ORDER — DILTIAZEM HCL 25 MG/5ML IV SOLN
15.0000 mg | Freq: Once | INTRAVENOUS | Status: AC
  Administered 2019-09-17: 15 mg via INTRAVENOUS
  Filled 2019-09-17: qty 5

## 2019-09-17 MED ORDER — DILTIAZEM LOAD VIA INFUSION: 15.0000 mg | Freq: Once | INTRAVENOUS | Status: DC

## 2019-09-17 MED ORDER — WARFARIN SODIUM 4 MG PO TABS
4.0000 mg | ORAL_TABLET | Freq: Once | ORAL | Status: AC
  Administered 2019-09-17: 18:00:00 4 mg via ORAL
  Filled 2019-09-17: qty 1

## 2019-09-17 NOTE — Progress Notes (Signed)
PROGRESS NOTE  Jaclyn Day [id number] DOB: 1945/12/25   PCP: Cyndi Bender, PA-C  Patient is from: Home  DOA: 08/03/2019 LOS: 44  Brief Narrative / Interim history: 73 year old female with history of congenital unilateral kidney, CKD-4, HTN, persistent A. fib on Eliquis and diastolic CHF, depression/anxiety/schizoaffective disorder and developmental disorder admitted to ICU 08/03/2023 cardiac arrest.  Found to have new systolic CHF with EF of 20 to 25%, global hypokinesis.  Blood culture grew MSSA for which she was started on vancomycin and transition to Ancef on 08/06/2019.  Patient had echocardiogram on 08/21/2019 that showed EF of 55 to 60%, moderate LAE and RVSP to 39.  Hospital course complicated by A. fib with RVR, acute metabolic encephalopathy/agitation and AKI.    Assessment & Plan: AKI/CKD-4: due to shock from cardiac issues and bacteremia.  Makes fair amount of urine. -Right IJ TDC on 11/11.  Turned down for permanent access due to debility -CRRT 10/30-11/1>>IHD TTS -Monitor urine output -Nephrology managing.  Metabolic acidosis/bone mineral disorder/hyperkalemia: due to renal failure. -Nephrology managing  Acute respiratory failure with hypoxia "multifactorial including cardiac arrest, pulmonary edema, bilateral pleural effusion and anxiety.  Resolved.  She is now on room air. -treat treatable causes.  MSSA bacteremia/AV endocarditis: Bandemia improving.  Remains afebrile.  Leukocytosis improving. -Continue Ancef until 10/19/2019 per ID-total of 6 weeks  Leukocytosis/bandemia: Resolved.  She remains afebrile.  Monitor CBC every 48 hours.  V. tach/cardiac arrest/acute systolic CHF: Initial TTE on 10/23 with EF of 20 to 25%.  Repeat TTE on 11/9 with EF of 55 to 60%.  Currently euvolemic except for stable trace LUE edema. -S/p ETT and hypothermia protocol in ICU -Fluid management by HD.  Acute metabolic encephalopathy: Resolved.  Oriented fairly but limited  insight -Frequent reorientation and delirium precautions.  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  Patient with significant upper and lower extremity weakness left greater than right mainly in upper extremities. -Continue warfarin per pharmacy .  INR almost therapeutic.  Persistent A. fib: HR in 110s with possible RVR and jumping to around 118. -We will give her a bolus of 15 mg Cardizem x1.  Continue p.o. amiodarone and warfarin -May consider adding metoprolol or bisoprolol for better rate control.   Anemia: Multifactorial-acute blood loss from HD cath 11/15, chronic disease and malnutrition. -Hgb 11.6 (admit & b/l)>> 6.9 (11/10)>1u> 8.2>> 5.9 (11/16)> 8.8>> 7.6>1u>8.8>> 8.4> 7.8> 8.3 -On Aranesp and iron per nephro -Optimize nutrition -Monitor H&H  Hypotension: Slightly hypotensive after hemodialysis but not symptomatic. -Off midodrine.  Blood pressure is much better currently.  Dysphagia: SLP and dietitian following.   -Appreciate SLP input-continue dysphagia 1 diet -Cortrak removed 12/4. -Needs assistance with feeding.  Oral thrush: Resolved.   Vitamin D deficiency -On weekly vitamin D 50,000 units  Stage I sacral decubitus ulcer: Not present on admission -Dressing and offloading per RN  Anxiety/depression/schizoaffective disorder/developmental disorder -Continue Depakote and Abilify -Cymbalta has been on hold.  Moderate malnutrition: Nutrition Problem: Moderate Malnutrition Etiology: acute illness(acute respiratory failure, AKI required CRRT, endocarditis)  Signs/Symptoms: mild fat depletion, mild muscle depletion, moderate muscle depletion  Interventions: Supplements and assistance with feeding.   DVT prophylaxis: Subcu heparin Code Status: Full code Family Communication: No family present.  Patient alert and oriented. Disposition Plan: Remains inpatient.  Final disposition LTAC or SNF. Turned  down by [facility]: Cardiology (signed off), neurology (signed off), IR (signed off), palliative, nephrology   Subjective: Seen and examined.  She  is alert and oriented.  She has no complaints.  Denied any chest pain or shortness of breath.  Heart rate around 115.  Objective: Vitals:   09/16/19 1740 09/16/19 2132 09/17/19 0225 09/17/19 0445  BP: 125/78 100/62  107/62  Pulse: 71 (!) 59  (!) 116  Resp: (!) 22 (!) 28  (!) 32  Temp: 98.1 F (36.7 C) 98.1 F (36.7 C)  (!) 97.5 F (36.4 C)  TempSrc: Oral Oral  Oral  SpO2: 100% 93%  98%  Weight:   73.9 kg   Height:        Intake/Output Summary (Last 24 hours) at 09/17/2019 0906 Last data filed at 09/17/2019 0215 Gross per 24 hour  Intake 95.79 ml  Output 2200 ml  Net -2104.21 ml   Filed Weights   09/15/19 0322 09/16/19 0645 09/17/19 0225  Weight: 75.3 kg 75.9 kg 73.9 kg    Examination:  General exam: Appears calm and comfortable  Respiratory system: Clear to auscultation. Respiratory effort normal. Cardiovascular system: S1 & S2 heard, irregularly regular rate and rhythm, no JVD, murmurs, rubs, gallops or clicks. No pedal edema. Gastrointestinal system: Abdomen is nondistended, soft and nontender. No organomegaly or masses felt. Normal bowel sounds heard. Central nervous system: Alert and oriented. No focal neurological deficits. Extremities: Symmetric 5 x 5 power. Skin: No rashes, lesions or ulcers.  Psychiatry: Judgement and insight appear poor,. Mood & affect flat.   Procedures:  HD cath placement CRRT IHD  Microbiology summarized: 10/22-COVID-19 negative 10/22-MRSA PCR negative 10/22-respiratory viral panel negative 10/22-urine culture with E. Coli 10/22-blood culture with MSSA in 1/2 bottles 10/26, 10/29, 11/16 and 11/20-blood cultures negative  Sch Meds:  Scheduled Meds: .  stroke: mapping our early stages of recovery book   Does not apply Once  . sodium chloride   Intravenous Once  . amiodarone   200 mg Oral Daily  . ARIPiprazole  10 mg Oral BID  . atorvastatin  20 mg Oral Daily  . chlorhexidine  15 mL Mouth Rinse BID  . Chlorhexidine Gluconate Cloth  6 each Topical Q0600  . coumadin book   Does not apply Once  . darbepoetin (ARANESP) injection - DIALYSIS  100 mcg Intravenous Q Sat-HD  . feeding supplement  1 Container Oral TID BM  . feeding supplement (NEPRO CARB STEADY)  237 mL Oral BID BM  . feeding supplement (PRO-STAT SUGAR FREE 64)  30 mL Oral BID  . ferrous sulfate  325 mg Oral Daily  . insulin aspart  0-9 Units Subcutaneous Q4H  . mouth rinse  15 mL Mouth Rinse BID  . multivitamin  1 tablet Oral QHS  . pantoprazole  40 mg Oral Daily  . sodium chloride flush  10-40 mL Intracatheter Q12H  . thiamine  100 mg Oral Daily  . Thrombi-Pad  1 each Topical Once  . valproic acid  250 mg Oral Q6H  . warfarin  4 mg Oral ONCE-1800  . Warfarin - Pharmacist Dosing Inpatient   Does not apply q1800   Continuous Infusions: . sodium chloride 10 mL (08/20/19 2025)  . sodium chloride    . sodium chloride    .  ceFAZolin (ANCEF) IV Stopped (09/16/19 1944)   PRN Meds:.sodium chloride, sodium chloride, acetaminophen, alteplase, heparin, HYDROmorphone (DILAUDID) injection, levalbuterol, lidocaine (PF), lidocaine-prilocaine, pentafluoroprop-tetrafluoroeth, sodium chloride flush  Antimicrobials: Anti-infectives (From admission, onward)   Start     Dose/Rate Route Frequency Ordered Stop   09/09/19 1800  ceFAZolin (ANCEF) IVPB 2g/100 mL premix  2 g 200 mL/hr over 30 Minutes Intravenous Every T-Th-Sa (1800) 09/05/19 1024     09/04/19 1800  ceFAZolin (ANCEF) IVPB 2g/100 mL premix     2 g 200 mL/hr over 30 Minutes Intravenous Every M-W-F (1800) 09/04/19 1335 09/06/19 1736   08/31/19 1800  ceFAZolin (ANCEF) IVPB 2g/100 mL premix  Status:  Discontinued     2 g 200 mL/hr over 30 Minutes Intravenous Every T-Th-Sa (1800) 08/31/19 1401 09/04/19 1335   08/30/19 0900  ceFAZolin (ANCEF) IVPB  2g/100 mL premix     2 g 200 mL/hr over 30 Minutes Intravenous  Once 08/30/19 0854 08/30/19 1134   08/24/19 1200  ceFAZolin (ANCEF) IVPB 2g/100 mL premix  Status:  Discontinued     2 g 200 mL/hr over 30 Minutes Intravenous Every T-Th-Sa (Hemodialysis) 08/23/19 0807 08/31/19 1401   08/20/19 2200  ceFAZolin (ANCEF) IVPB 1 g/50 mL premix  Status:  Discontinued     1 g 100 mL/hr over 30 Minutes Intravenous Daily at bedtime 08/20/19 1507 08/23/19 0807   08/15/19 2200  ceFAZolin (ANCEF) IVPB 2g/100 mL premix  Status:  Discontinued     2 g 200 mL/hr over 30 Minutes Intravenous Daily at bedtime 08/15/19 1133 08/20/19 1507   08/11/19 1800  ceFAZolin (ANCEF) IVPB 2g/100 mL premix  Status:  Discontinued     2 g 200 mL/hr over 30 Minutes Intravenous Every 12 hours 08/11/19 1110 08/15/19 1133   08/10/19 1600  ceFAZolin (ANCEF) IVPB 1 g/50 mL premix  Status:  Discontinued     1 g 100 mL/hr over 30 Minutes Intravenous Every 12 hours 08/10/19 1530 08/11/19 1110   08/06/19 1800  ceFAZolin (ANCEF) IVPB 1 g/50 mL premix  Status:  Discontinued     1 g 100 mL/hr over 30 Minutes Intravenous Every 24 hours 08/06/19 1025 08/10/19 1530   08/05/19 1530  vancomycin (VANCOCIN) 1,750 mg in sodium chloride 0.9 % 500 mL IVPB     1,750 mg 250 mL/hr over 120 Minutes Intravenous  Once 08/05/19 1444 08/05/19 1736   08/05/19 1443  vancomycin variable dose per unstable renal function (pharmacist dosing)  Status:  Discontinued      Does not apply See admin instructions 08/05/19 1444 08/06/19 1025   08/03/19 1300  cefTRIAXone (ROCEPHIN) 2 g in sodium chloride 0.9 % 100 mL IVPB  Status:  Discontinued     2 g 200 mL/hr over 30 Minutes Intravenous Every 24 hours 08/03/19 1251 08/06/19 1025       I have personally reviewed the following labs and images: CBC: Recent Labs  Lab 09/11/19 0429 09/13/19 0459 09/15/19 0438 09/16/19 0728 09/17/19 0431  WBC 11.8* 12.8* 13.3* 9.5 10.0  HGB 8.5* 8.3* 8.4* 7.8* 8.3*  HCT 27.4*  26.4* 26.7* 25.6* 27.0*  MCV 91.6 91.0 92.4 94.1 91.8  PLT 221 204 224 229 239   BMP &GFR Recent Labs  Lab 09/11/19 0429 09/13/19 0459 09/13/19 1745 09/15/19 1647 09/16/19 0728 09/17/19 0431  NA 137 135 136 136 139 138  K 4.6 5.2* 4.3 4.4 4.3 4.1  CL 98 95* 96* 99 99 97*  CO2 27 25 27 25 26 27   GLUCOSE 113* 98 111* 102* 96 106*  BUN 52* 85* 30* 68* 78* 40*  CREATININE 3.31* 4.32* 2.10* 3.69* 3.93* 2.42*  CALCIUM 9.2 9.1 8.9 9.2 9.2 9.2  MG 2.0  --   --   --   --  1.8  PHOS 5.0* 6.1* 3.7 5.9* 6.7* 4.6  Estimated Creatinine Clearance: 21.6 mL/min (A) (by C-G formula based on SCr of 2.42 mg/dL (H)). Liver & Pancreas: Recent Labs  Lab 09/13/19 0459 09/13/19 1745 09/15/19 1647 09/16/19 0728 09/17/19 0431  ALBUMIN 1.8* 1.8* 1.8* 1.7* 1.8*   No results for input(s): LIPASE, AMYLASE in the last 168 hours. No results for input(s): AMMONIA in the last 168 hours. Diabetic: No results for input(s): HGBA1C in the last 72 hours. Recent Labs  Lab 09/16/19 1738 09/16/19 1959 09/16/19 2356 09/17/19 0440 09/17/19 0724  GLUCAP 84 152* 76 101* 104*   Cardiac Enzymes: No results for input(s): CKTOTAL, CKMB, CKMBINDEX, TROPONINI in the last 168 hours. No results for input(s): PROBNP in the last 8760 hours. Coagulation Profile: Recent Labs  Lab 09/13/19 0459 09/14/19 0406 09/15/19 0438 09/16/19 0347 09/17/19 0431  INR 2.2* 1.9* 2.0* 2.3* 1.9*   Thyroid Function Tests: No results for input(s): TSH, T4TOTAL, FREET4, T3FREE, THYROIDAB in the last 72 hours. Lipid Profile: No results for input(s): CHOL, HDL, LDLCALC, TRIG, CHOLHDL, LDLDIRECT in the last 72 hours. Anemia Panel: No results for input(s): VITAMINB12, FOLATE, FERRITIN, TIBC, IRON, RETICCTPCT in the last 72 hours. Urine analysis:    Component Value Date/Time   COLORURINE BROWN (A) 08/05/2019 2018   APPEARANCEUR TURBID (A) 08/05/2019 2018   LABSPEC RESULTS UNAVAILABLE DUE TO INTERFERING SUBSTANCE 08/05/2019 2018    PHURINE RESULTS UNAVAILABLE DUE TO INTERFERING SUBSTANCE 08/05/2019 2018   GLUCOSEU RESULTS UNAVAILABLE DUE TO INTERFERING SUBSTANCE (A) 08/05/2019 2018   HGBUR RESULTS UNAVAILABLE DUE TO INTERFERING SUBSTANCE (A) 08/05/2019 2018   BILIRUBINUR RESULTS UNAVAILABLE DUE TO INTERFERING SUBSTANCE (A) 08/05/2019 2018   KETONESUR RESULTS UNAVAILABLE DUE TO INTERFERING SUBSTANCE (A) 08/05/2019 2018   PROTEINUR RESULTS UNAVAILABLE DUE TO INTERFERING SUBSTANCE (A) 08/05/2019 2018   UROBILINOGEN 0.2 05/07/2009 1520   NITRITE RESULTS UNAVAILABLE DUE TO INTERFERING SUBSTANCE (A) 08/05/2019 2018   LEUKOCYTESUR RESULTS UNAVAILABLE DUE TO INTERFERING SUBSTANCE (A) 08/05/2019 2018   Sepsis Labs: Invalid input(s): PROCALCITONIN, [place]  Microbiology: Recent Results (from the past 240 hour(s))  MRSA PCR Screening     Status: None   Collection Time: 09/12/19  3:42 PM   Specimen: Nasopharyngeal  Result Value Ref Range Status   MRSA by PCR NEGATIVE NEGATIVE Final    Comment:        The GeneXpert MRSA Assay (FDA approved for NASAL specimens only), is one component of a comprehensive MRSA colonization surveillance program. It is not intended to diagnose MRSA infection nor to guide or monitor treatment for MRSA infections. Performed at [facility], [place] [address] [place], [place] [address]     Radiology Studies: No results found.  Total time spent 31 minutes Darliss Cheney, MD [facility]  If 7PM-7AM, please contact night-coverage [email or web address] Password [email or web address] 09/17/2019, 9:06 AM

## 2019-09-17 NOTE — Progress Notes (Signed)
Patient ID: Jaclyn Day, female   DOB: 02-16-1946, 73 y.o.   MRN: [id number]  [facility] Progress Note    Assessment/ Plan:   1. Acute kidney Injury on chronic kidney disease stage IV: Oliguric and appears consistent with ATN following cardiac arrest and MSSA bacteremia.  Continue daily labs and evaluation for rising dialysis needs-no clear evidence of renal recovery and at this point; may indeed be heading towards ESRD.  Currently getting hemodialysis via right IJ TDC on a TTS schedule (permanent access precluded by bacteremia/clinical status when earlier seen by vascular surgery.  I will continue to follow her progress with physical therapy to determine ability to dialyze in recliner which will be a prerequisite to outpatient dialysis unit placement.  Verbal report indicates that she was declined for LTAC. 2.  MSSA bacteremia/sepsis: Remains on intravenous Ancef, source suspected to be from cellulitis of the lower extremities. 3.  Status post cardiac arrest 4.  History of paroxysmal atrial fibrillation: Intermittent tachycardia noted, remains on amiodarone with anticoagulation per cardiology. 5.  Protein calorie malnutrition/deconditioning following acute illness: Anticipate prolonged rehabilitation course and may need LTAC (reportedly declined for admission).  Subjective:   No acute events overnight, reports dialysis was uneventful yesterday.   Objective:   BP 107/62 (BP Location: Left Arm)   Pulse (!) 116   Temp (!) 97.5 F (36.4 C) (Oral)   Resp (!) 32   Ht 5\' 6"  (1.676 m)   Wt 73.9 kg   SpO2 98%   BMI 26.30 kg/m   Intake/Output Summary (Last 24 hours) at 09/17/2019 1028 Last data filed at 09/17/2019 0215 Gross per 24 hour  Intake 95.79 ml  Output 2200 ml  Net -2104.21 ml   Weight change: -2 kg  Physical Exam: Gen: Comfortably resting in bed, boyfriend at side CVS: Pulse irregular tachycardia, S1 and S2 normal Resp: Coarse breath sounds bilaterally  without distinct rales/wheeze Abd: Soft, obese, nontender Ext: Trace-1+ dependent/lower extremity edema  Imaging: No results found.  Labs: BMET Recent Labs  Lab 09/11/19 0429 09/13/19 0459 09/13/19 1745 09/15/19 1647 09/16/19 0728 09/17/19 0431  NA 137 135 136 136 139 138  K 4.6 5.2* 4.3 4.4 4.3 4.1  CL 98 95* 96* 99 99 97*  CO2 27 25 27 25 26 27   GLUCOSE 113* 98 111* 102* 96 106*  BUN 52* 85* 30* 68* 78* 40*  CREATININE 3.31* 4.32* 2.10* 3.69* 3.93* 2.42*  CALCIUM 9.2 9.1 8.9 9.2 9.2 9.2  PHOS 5.0* 6.1* 3.7 5.9* 6.7* 4.6   CBC Recent Labs  Lab 09/13/19 0459 09/15/19 0438 09/16/19 0728 09/17/19 0431  WBC 12.8* 13.3* 9.5 10.0  HGB 8.3* 8.4* 7.8* 8.3*  HCT 26.4* 26.7* 25.6* 27.0*  MCV 91.0 92.4 94.1 91.8  PLT 204 224 229 239    Medications:    .  stroke: mapping our early stages of recovery book   Does not apply Once  . sodium chloride   Intravenous Once  . amiodarone  200 mg Oral Daily  . ARIPiprazole  10 mg Oral BID  . atorvastatin  20 mg Oral Daily  . chlorhexidine  15 mL Mouth Rinse BID  . Chlorhexidine Gluconate Cloth  6 each Topical Q0600  . coumadin book   Does not apply Once  . darbepoetin (ARANESP) injection - DIALYSIS  100 mcg Intravenous Q Sat-HD  . diltiazem  15 mg Intravenous Once  . feeding supplement  1 Container Oral TID BM  . feeding supplement (NEPRO CARB  STEADY)  237 mL Oral BID BM  . feeding supplement (PRO-STAT SUGAR FREE 64)  30 mL Oral BID  . ferrous sulfate  325 mg Oral Daily  . insulin aspart  0-9 Units Subcutaneous Q4H  . mouth rinse  15 mL Mouth Rinse BID  . multivitamin  1 tablet Oral QHS  . pantoprazole  40 mg Oral Daily  . sodium chloride flush  10-40 mL Intracatheter Q12H  . thiamine  100 mg Oral Daily  . Thrombi-Pad  1 each Topical Once  . valproic acid  250 mg Oral Q6H  . warfarin  4 mg Oral ONCE-1800  . Warfarin - Pharmacist Dosing Inpatient   Does not apply [id number]   Elmarie Shiley, MD 09/17/2019, 10:28 AM

## 2019-09-17 NOTE — Progress Notes (Signed)
ANTICOAGULATION CONSULT NOTE - Follow-Up Consult  Pharmacy Consult for Warfarin Indication: atrial fibrillation  Allergies  Allergen Reactions  . Codeine Other (See Comments)    Reaction not recalled by family- was told to never take this    Patient Measurements: Height: 5\' 6"  (167.6 cm) Weight: 162 lb 14.7 oz (73.9 kg) IBW/kg (Calculated) : 59.3  Vital Signs: Temp: 97.5 F (36.4 C) (12/06 0445) Temp Source: Oral (12/06 0445) BP: 107/62 (12/06 0445) Pulse Rate: 116 (12/06 0445)  Labs: Recent Labs    09/15/19 0438 09/15/19 1647 09/16/19 0347 09/16/19 0728 09/17/19 0431  HGB 8.4*  --   --  7.8* 8.3*  HCT 26.7*  --   --  25.6* 27.0*  PLT 224  --   --  229 239  LABPROT 22.9*  --  24.8*  --  21.5*  INR 2.0*  --  2.3*  --  1.9*  CREATININE  --  3.69*  --  3.93* 2.42*    Estimated Creatinine Clearance: 21.6 mL/min (A) (by C-G formula based on SCr of 2.42 mg/dL (H)).   Medical History: Past Medical History:  Diagnosis Date  . Anemia   . Chronic diastolic CHF (congestive heart failure) ([place])   . CKD (chronic kidney disease), stage IV ([place])   . Hypertension   . Persistent atrial fibrillation ([place])    a. dx 01/2019 s/p TEE DCCV.  Marland Kitchen Renal disorder   . Unilateral congenital absence of kidney    Assessment: Pt is a 73 yr old female admitted s/p out of hospital cardiac arrest and TTM. Admit c/b by encephalopathy, AKI which progressed to HD, and ?embolic CVA. Pt on apixaban PTA for Afib, which was held for IV heparin. Heparin subsequently held on 11/16 due to bleeding from HD lines and low Hgb. Warfarin started 11/18 with no bridge planned. Patient on amiodarone PO   -Today's INR 1.9  Goal of Therapy:  INR 2-3 Monitor platelets by anticoagulation protocol: Yes   Plan:  Warfarin 4 mg po  tonight Daily INR  Thank you Anette Guarneri, PharmD  09/17/2019 7:20 AM

## 2019-09-17 NOTE — Discharge Instructions (Signed)

## 2019-09-18 LAB — GLUCOSE, CAPILLARY
Glucose-Capillary: 67 mg/dL — ABNORMAL LOW (ref 70–99)
Glucose-Capillary: 95 mg/dL (ref 70–99)
Glucose-Capillary: 89 mg/dL (ref 70–99)
Glucose-Capillary: 146 mg/dL — ABNORMAL HIGH (ref 70–99)
Glucose-Capillary: 82 mg/dL (ref 70–99)
Glucose-Capillary: 123 mg/dL — ABNORMAL HIGH (ref 70–99)

## 2019-09-18 LAB — RENAL FUNCTION PANEL
Albumin: 1.9 g/dL — ABNORMAL LOW (ref 3.5–5.0)
Anion gap: 14 (ref 5–15)
BUN: 65 mg/dL — ABNORMAL HIGH (ref 8–23)
CO2: 25 mmol/L (ref 22–32)
Calcium: 9.6 mg/dL (ref 8.9–10.3)
Chloride: 99 mmol/L (ref 98–111)
Creatinine, Ser: 3.28 mg/dL — ABNORMAL HIGH (ref 0.44–1.00)
GFR calc Af Amer: 16 mL/min — ABNORMAL LOW (ref >60)
GFR calc non Af Amer: 13 mL/min — ABNORMAL LOW (ref >60)
Glucose, Bld: 67 mg/dL — ABNORMAL LOW (ref 70–99)
Phosphorus: 6.3 mg/dL — ABNORMAL HIGH (ref 2.5–4.6)
Potassium: 4.5 mmol/L (ref 3.5–5.1)
Sodium: 138 mmol/L (ref 135–145)

## 2019-09-18 LAB — PROTIME-INR
INR: 2.1 — ABNORMAL HIGH (ref 0.8–1.2)
Prothrombin Time: 23.1 seconds — ABNORMAL HIGH (ref 11.4–15.2)

## 2019-09-18 LAB — MAGNESIUM: Magnesium: 2.1 mg/dL (ref 1.7–2.4)

## 2019-09-18 MED ORDER — SODIUM CHLORIDE 0.9% FLUSH: 3.0000 mL | INTRAVENOUS | Status: DC | PRN

## 2019-09-18 MED ORDER — METOPROLOL TARTRATE 12.5 MG HALF TABLET
12.5000 mg | ORAL_TABLET | Freq: Two times a day (BID) | ORAL | Status: DC
  Administered 2019-09-18 – 2019-09-24 (×12): 12.5 mg via ORAL
  Filled 2019-09-18 (×12): qty 1

## 2019-09-18 MED ORDER — WARFARIN SODIUM 2 MG PO TABS
3.0000 mg | ORAL_TABLET | Freq: Once | ORAL | Status: AC
  Administered 2019-09-18: 3 mg via ORAL
  Filled 2019-09-18: qty 1

## 2019-09-18 MED ORDER — SODIUM CHLORIDE 0.9% FLUSH
3.0000 mL | Freq: Two times a day (BID) | INTRAVENOUS | Status: DC
  Administered 2019-09-18 – 2019-09-24 (×10): 3 mL via INTRAVENOUS

## 2019-09-18 NOTE — Progress Notes (Signed)
ANTICOAGULATION CONSULT NOTE - Follow-Up Consult  Pharmacy Consult for Warfarin Indication: atrial fibrillation  Allergies  Allergen Reactions  . Codeine Other (See Comments)    Reaction not recalled by family- was told to never take this    Patient Measurements: Height: 5\' 6"  (167.6 cm) Weight: 163 lb 2.3 oz (74 kg) IBW/kg (Calculated) : 59.3  Vital Signs: Temp: 98.1 F (36.7 C) (12/07 0420) Temp Source: Axillary (12/07 0420) BP: 114/77 (12/07 0420) Pulse Rate: 89 (12/07 0420)  Labs: Recent Labs    09/16/19 0347 09/16/19 0728 09/17/19 0431 09/18/19 0336  HGB  --  7.8* 8.3*  --   HCT  --  25.6* 27.0*  --   PLT  --  229 239  --   LABPROT 24.8*  --  21.5* 23.1*  INR 2.3*  --  1.9* 2.1*  CREATININE  --  3.93* 2.42* 3.28*    Estimated Creatinine Clearance: 16 mL/min (A) (by C-G formula based on SCr of 3.28 mg/dL (H)).   Medical History: Past Medical History:  Diagnosis Date  . Anemia   . Chronic diastolic CHF (congestive heart failure) ([place])   . CKD (chronic kidney disease), stage IV ([place])   . Hypertension   . Persistent atrial fibrillation ([place])    a. dx 01/2019 s/p TEE DCCV.  Marland Kitchen Renal disorder   . Unilateral congenital absence of kidney    Assessment: Pt is a 73 yr old female admitted s/p out of hospital cardiac arrest and TTM. Admit c/b by encephalopathy, AKI which progressed to HD, and ?embolic CVA. Pt on apixaban PTA for Afib, which was held for IV heparin. Heparin subsequently held on 11/16 due to bleeding from HD lines and low Hgb. Warfarin started 11/18 with no bridge planned. Patient on amiodarone PO   -Today's INR 2.1  Goal of Therapy:  INR 2-3 Monitor platelets by anticoagulation protocol: Yes   Plan:  Warfarin 3 mg po x 1. Daily INR.  Marguerite Olea, [facility] Clinical Pharmacist Phone [phone]  09/18/2019 9:41 AM

## 2019-09-18 NOTE — Progress Notes (Signed)
Patient ID: Jaclyn Day, female   DOB: 03-18-1946, 73 y.o.   MRN: [id number]  [facility] Progress Note    Assessment/ Plan:   1. Acute kidney Injury on chronic kidney disease stage IV: Oliguric and appears consistent with ATN following cardiac arrest and MSSA bacteremia.  Continue daily labs and evaluation for rising dialysis needs-no clear evidence of renal recovery and at this point; may indeed be heading towards ESRD.  Currently getting hemodialysis via right IJ TDC on a TTS schedule (permanent access precluded by bacteremia/clinical status when earlier seen by vascular surgery).  Continue to follow progress with PT/OT to evaluate ability to undertake outpatient hemodialysis in recliner along with transfers.  Next dialysis tomorrow. 2.  MSSA bacteremia/sepsis: Remains on intravenous Ancef, source suspected to be from cellulitis of the lower extremities. 3.  Status post cardiac arrest: With significant deconditioning during his hospitalization. 4.  History of paroxysmal atrial fibrillation: Intermittent tachycardia noted, remains on amiodarone with anticoagulation per cardiology. 5.  Protein calorie malnutrition/deconditioning following acute illness: Anticipate prolonged rehabilitation course and may need LTAC (reportedly declined for admission).  Subjective:   No acute events overnight, denies any complaints.  Awaiting reevaluation by physical therapy.   Objective:   BP 114/77 (BP Location: Left Arm)   Pulse 89   Temp 98.1 F (36.7 C) (Axillary)   Resp 17   Ht 5\' 6"  (1.676 m)   Wt 74 kg   SpO2 100%   BMI 26.33 kg/m   Intake/Output Summary (Last 24 hours) at 09/18/2019 0932 Last data filed at 09/18/2019 0415 Gross per 24 hour  Intake 120 ml  Output -  Net 120 ml   Weight change: 0.1 kg  Physical Exam: Gen: Resting comfortably in bed CVS: Pulse irregular tachycardia, S1 and S2 normal Resp: Coarse breath sounds bilaterally without distinct rales/wheeze Abd:  Soft, obese, nontender Ext: Trace-1+ dependent/lower extremity edema  Imaging: No results found.  Labs: BMET Recent Labs  Lab 09/13/19 0459 09/13/19 1745 09/15/19 1647 09/16/19 0728 09/17/19 0431 09/18/19 0336  NA 135 136 136 139 138 138  K 5.2* 4.3 4.4 4.3 4.1 4.5  CL 95* 96* 99 99 97* 99  CO2 25 27 25 26 27 25   GLUCOSE 98 111* 102* 96 106* 67*  BUN 85* 30* 68* 78* 40* 65*  CREATININE 4.32* 2.10* 3.69* 3.93* 2.42* 3.28*  CALCIUM 9.1 8.9 9.2 9.2 9.2 9.6  PHOS 6.1* 3.7 5.9* 6.7* 4.6 6.3*   CBC Recent Labs  Lab 09/13/19 0459 09/15/19 0438 09/16/19 0728 09/17/19 0431  WBC 12.8* 13.3* 9.5 10.0  HGB 8.3* 8.4* 7.8* 8.3*  HCT 26.4* 26.7* 25.6* 27.0*  MCV 91.0 92.4 94.1 91.8  PLT 204 224 229 239    Medications:    .  stroke: mapping our early stages of recovery book   Does not apply Once  . sodium chloride   Intravenous Once  . amiodarone  200 mg Oral Daily  . ARIPiprazole  10 mg Oral BID  . atorvastatin  20 mg Oral Daily  . chlorhexidine  15 mL Mouth Rinse BID  . Chlorhexidine Gluconate Cloth  6 each Topical Q0600  . coumadin book   Does not apply Once  . darbepoetin (ARANESP) injection - DIALYSIS  100 mcg Intravenous Q Sat-HD  . feeding supplement  1 Container Oral TID BM  . feeding supplement (NEPRO CARB STEADY)  237 mL Oral BID BM  . feeding supplement (PRO-STAT SUGAR FREE 64)  30 mL Oral BID  .  ferrous sulfate  325 mg Oral Daily  . insulin aspart  0-9 Units Subcutaneous Q4H  . mouth rinse  15 mL Mouth Rinse BID  . multivitamin  1 tablet Oral QHS  . pantoprazole  40 mg Oral Daily  . sodium chloride flush  3 mL Intravenous Q12H  . thiamine  100 mg Oral Daily  . Thrombi-Pad  1 each Topical Once  . valproic acid  250 mg Oral Q6H  . Warfarin - Pharmacist Dosing Inpatient   Does not apply [id number]   Elmarie Shiley, MD 09/18/2019, 9:32 AM

## 2019-09-18 NOTE — Progress Notes (Signed)
Physical Therapy Treatment Patient Details Name: Jaclyn Day MRN: [id number] DOB: 06-13-1946 Today's Date: 09/18/2019    History of Present Illness 73yo female admitted after out of hospital cardiac arrest, unclear etiology CPR started by fire 10 min CPR with 1 Epi before ROSC. PMH includes a-fib, diastolic CHF, Mood disorder, CKD. CRRT initiated on 10/31 for volume removal. Pt transitioned to intermittent HD.     PT Comments    Patient received in bed, boyfriend present and very supportive of patient participating in therapy today. She required maxAx2 for bed mobility, once at EOB she demonstrated strong posterior lean requiring MaxA to maintain upright as well as max VC/TC from PT and boyfriend to reach for stedy bar, once she had it she was able to come to upright however. Made multiple attempts from increasingly elevated bed for sit to stand in stedy with totalAx2 as patient with severe anxiety and had mentally shut down, no initiation or effort to stand and patient physically shaking/confirming she was afraid. All vitals stable. Returned her to bed with maxAx2, then used maxisky to transfer her to the chair with totalA. She was left up in the chair with all needs met, boyfriend present, and nursing staff aware of patient status/need for use of lift back to bed.     Follow Up Recommendations  SNF;Supervision/Assistance - 24 hour     Equipment Recommendations  None recommended by PT    Recommendations for Other Services       Precautions / Restrictions Precautions Precautions: Fall Precaution Comments: BUE and BLE weakness with digit extension limitations noted as well, increased swelling in L UE. Restrictions Weight Bearing Restrictions: No    Mobility  Bed Mobility Overal bed mobility: Needs Assistance Bed Mobility: Supine to Sit;Sit to Supine Rolling: Max assist;+2 for physical assistance   Supine to sit: Max assist;+2 for physical assistance Sit to supine: +2 for  physical assistance;Max assist   General bed mobility comments: +2 for all aspects of bed mobility, became very anxious once sitting at EOB wtih strong posterior lean and max cues from PT and boyfriend to get her to reach for steady crossbar  Transfers Overall transfer level: Needs assistance Equipment used: Ambulation equipment used Transfers: Sit to/from Stand          Lateral/Scoot Transfers: Total assist;+2 physical assistance;From elevated surface General transfer comment: attempted sit to stand 4x from increasingly elevated bed, patient with no initiation or effort so had to transition to [facility]  Ambulation/Gait             General Gait Details: unable   Stairs             Wheelchair Mobility    Modified Rankin (Stroke Patients Only)       Balance Overall balance assessment: Needs assistance Sitting-balance support: Feet supported;Bilateral upper extremity supported Sitting balance-Leahy Scale: Poor Sitting balance - Comments: strong posterior lean/max cues for coming to neutral position Postural control: Posterior lean   Standing balance-Leahy Scale: Zero                              Cognition Arousal/Alertness: Awake/alert Behavior During Therapy: Anxious;Flat affect Overall Cognitive Status: Impaired/Different from baseline Area of Impairment: Attention;Following commands;Safety/judgement;Problem solving                 Orientation Level: Disoriented to;Situation;Time;Place Current Attention Level: Focused Memory: Decreased short-term memory Following Commands: Follows one step commands inconsistently;Follows one step  commands with increased time Safety/Judgement: Decreased awareness of deficits   Problem Solving: Slow processing;Decreased initiation;Difficulty sequencing;Requires verbal cues;Requires tactile cues General Comments: slow to process commands, once she hits a certain level of anxiety she seems to just mentally  shut down and not listen      Exercises General Exercises - Upper Extremity Shoulder Flexion: AAROM;Right;Left;10 reps;Supine Elbow Flexion: AROM;Both;10 reps;Supine Elbow Extension: Right;Left;10 reps;AROM;Supine Wrist Extension: AAROM;Both;10 reps;Supine Digit Composite Flexion: AAROM;Right;Left;10 reps;Supine Composite Extension: AAROM;Left;Right;10 reps;Supine Other Exercises Other Exercises: retrograde massage L UE followed by elevation on 2 pillows    General Comments        Pertinent Vitals/Pain Pain Assessment: Faces Faces Pain Scale: Hurts a little bit Pain Location: generalized pain Pain Descriptors / Indicators: Grimacing;Guarding Pain Intervention(s): Limited activity within patient's tolerance;Monitored during session    Home Living                      Prior Function            PT Goals (current goals can now be found in the care plan section) Acute Rehab PT Goals Patient Stated Goal: to get stronger PT Goal Formulation: With patient Time For Goal Achievement: 10/02/19 Potential to Achieve Goals: Fair Progress towards PT goals: Not progressing toward goals - comment(limited by anxiety)    Frequency    Min 2X/week      PT Plan Current plan remains appropriate    Co-evaluation              AM-PAC PT "6 Clicks" Mobility   Outcome Measure  Help needed turning from your back to your side while in a flat bed without using bedrails?: A Lot Help needed moving from lying on your back to sitting on the side of a flat bed without using bedrails?: A Lot Help needed moving to and from a bed to a chair (including a wheelchair)?: Total Help needed standing up from a chair using your arms (e.g., wheelchair or bedside chair)?: Total Help needed to walk in hospital room?: Total Help needed climbing 3-5 steps with a railing? : Total 6 Click Score: 8    End of Session   Activity Tolerance: Patient tolerated treatment well Patient left: with  call bell/phone within reach;in chair;with family/visitor present;with nursing/sitter in room;with chair alarm set Nurse Communication: Mobility status;Need for lift equipment PT Visit Diagnosis: Muscle weakness (generalized) (M62.81)     Time: 8309-4076 PT Time Calculation (min) (ACUTE ONLY): 40 min  Charges:  $Therapeutic Activity: 38-52 mins                     Windell Norfolk, DPT, PN1   Supplemental Physical Therapist [place]    Pager [phone] Acute Rehab Office [phone]

## 2019-09-18 NOTE — Progress Notes (Signed)
PROGRESS NOTE  Jaclyn Day [id number] DOB: 07/13/1946   PCP: Cyndi Bender, PA-C  Patient is from: Home  DOA: 08/03/2019 LOS: 9  Brief Narrative / Interim history: 73 year old female with history of congenital unilateral kidney, CKD-4, HTN, persistent A. fib on Eliquis and diastolic CHF, depression/anxiety/schizoaffective disorder and developmental disorder admitted to ICU 08/03/2023 cardiac arrest.  Found to have new systolic CHF with EF of 20 to 25%, global hypokinesis.  Blood culture grew MSSA for which she was started on vancomycin and transition to Ancef on 08/06/2019.  Patient had echocardiogram on 08/21/2019 that showed EF of 55 to 60%, moderate LAE and RVSP to 39.  Hospital course complicated by A. fib with RVR, acute metabolic encephalopathy/agitation and AKI.    Assessment & Plan: AKI/CKD-4: due to shock from cardiac issues and bacteremia.  Makes fair amount of urine. -Right IJ TDC on 11/11.  Turned down for permanent access due to debility -CRRT 10/30-11/1>>IHD TTS -Monitor urine output -Nephrology managing.  Metabolic acidosis/bone mineral disorder/hyperkalemia: due to renal failure. -Nephrology managing  Acute respiratory failure with hypoxia "multifactorial including cardiac arrest, pulmonary edema, bilateral pleural effusion and anxiety.  Resolved.  She is now on room air. -treat treatable causes.  MSSA bacteremia/AV endocarditis: Bandemia improving.  Remains afebrile.  Leukocytosis improving. -Continue Ancef until 09/17/2020 per ID-total of 6 weeks.  Discontinue after today's dose.  Leukocytosis/bandemia: Resolved.  She remains afebrile.  Monitor CBC every 48 hours.  V. tach/cardiac arrest/acute systolic CHF: Initial TTE on 10/23 with EF of 20 to 25%.  Repeat TTE on 11/9 with EF of 55 to 60%.  Currently euvolemic except for stable trace LUE edema. -S/p ETT and hypothermia protocol in ICU -Fluid management by HD.  Acute metabolic encephalopathy:  Resolved.  Oriented fairly but limited insight -Frequent reorientation and delirium precautions.  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  Patient with significant upper and lower extremity weakness left greater than right mainly in upper extremities. -Continue warfarin per pharmacy .  INR almost therapeutic.  Persistent A. fib: She continues to remain in A. fib.  Yesterday she required 1 dose of Cardizem 15 mg IV bolus.  That brought her heart rate down and she has remained under 100 since then.  She is on amiodarone which we will continue along with warfarin.  INR therapeutic today.  Now that her blood pressure is fairly normal and stable, I will start her on metoprolol 12.5 mg p.o. twice daily to better control her heart rate.   Anemia: Multifactorial-acute blood loss from HD cath 11/15, chronic disease and malnutrition. -Hgb 11.6 (admit & b/l)>> 6.9 (11/10)>1u> 8.2>> 5.9 (11/16)> 8.8>> 7.6>1u>8.8>> 8.4> 7.8> 8.3 -On Aranesp and iron per nephro -Optimize nutrition -Monitor H&H  Hypotension: Slightly hypotensive after hemodialysis but not symptomatic. -Off midodrine.  Blood pressure is much better currently.  Dysphagia: SLP and dietitian following.   -Appreciate SLP input-continue dysphagia 1 diet -Cortrak removed 12/4. -Needs assistance with feeding.  Oral thrush: Resolved.   Vitamin D deficiency -On weekly vitamin D 50,000 units  Stage I sacral decubitus ulcer: Not present on admission -Dressing and offloading per RN  Anxiety/depression/schizoaffective disorder/developmental disorder -Continue Depakote and Abilify -Cymbalta has been on hold.  Moderate malnutrition: Nutrition Problem: Moderate Malnutrition Etiology: acute illness(acute respiratory failure, AKI required CRRT, endocarditis)  Signs/Symptoms: mild fat depletion, mild muscle depletion, moderate muscle depletion  Interventions: Supplements and  assistance with feeding.   DVT prophylaxis: Subcu heparin Code Status: Full  code Family Communication: No family present.  Patient alert and oriented. Disposition Plan: Remains inpatient.  Final disposition LTAC or SNF. Turned down by [facility]: Cardiology (signed off), neurology (signed off), IR (signed off), palliative, nephrology   Subjective: Patient seen and examined.  She has no complaints.  Denied any chest pain or shortness of breath.  Objective: Vitals:   09/17/19 2200 09/17/19 2335 09/18/19 0000 09/18/19 0420  BP:  132/80  114/77  Pulse: 79 (!) 101 63 89  Resp: (!) 23 (!) 27 (!) 25 17  Temp:  98.2 F (36.8 C)  98.1 F (36.7 C)  TempSrc:  Oral  Axillary  SpO2: 96% 96% 98% 100%  Weight:    74 kg  Height:        Intake/Output Summary (Last 24 hours) at 09/18/2019 0941 Last data filed at 09/18/2019 0415 Gross per 24 hour  Intake 120 ml  Output -  Net 120 ml   Filed Weights   09/16/19 0645 09/17/19 0225 09/18/19 0420  Weight: 75.9 kg 73.9 kg 74 kg    Examination:  General exam: Appears calm and comfortable  Respiratory system: Clear to auscultation. Respiratory effort normal. Cardiovascular system: S1 & S2 heard, irregularly irregular rate and rhythm. No JVD, murmurs, rubs, gallops or clicks. No pedal edema. Gastrointestinal system: Abdomen is nondistended, soft and nontender. No organomegaly or masses felt. Normal bowel sounds heard. Central nervous system: Alert and oriented. No focal neurological deficits. Extremities: Symmetric 5 x 5 power. Skin: No rashes, lesions or ulcers.  Psychiatry: Judgement and insight appear poor, mood & affect flat.   Procedures:  HD cath placement CRRT IHD  Microbiology summarized: 10/22-COVID-19 negative 10/22-MRSA PCR negative 10/22-respiratory viral panel negative 10/22-urine culture with E. Coli 10/22-blood culture with MSSA in 1/2 bottles 10/26, 10/29, 11/16 and 11/20-blood cultures negative  Sch Meds:   Scheduled Meds: .  stroke: mapping our early stages of recovery book   Does not apply Once  . sodium chloride   Intravenous Once  . amiodarone  200 mg Oral Daily  . ARIPiprazole  10 mg Oral BID  . atorvastatin  20 mg Oral Daily  . chlorhexidine  15 mL Mouth Rinse BID  . Chlorhexidine Gluconate Cloth  6 each Topical Q0600  . coumadin book   Does not apply Once  . darbepoetin (ARANESP) injection - DIALYSIS  100 mcg Intravenous Q Sat-HD  . feeding supplement  1 Container Oral TID BM  . feeding supplement (NEPRO CARB STEADY)  237 mL Oral BID BM  . feeding supplement (PRO-STAT SUGAR FREE 64)  30 mL Oral BID  . ferrous sulfate  325 mg Oral Daily  . insulin aspart  0-9 Units Subcutaneous Q4H  . mouth rinse  15 mL Mouth Rinse BID  . metoprolol tartrate  12.5 mg Oral BID  . multivitamin  1 tablet Oral QHS  . pantoprazole  40 mg Oral Daily  . sodium chloride flush  3 mL Intravenous Q12H  . thiamine  100 mg Oral Daily  . Thrombi-Pad  1 each Topical Once  . valproic acid  250 mg Oral Q6H  . warfarin  3 mg Oral ONCE-1800  . Warfarin - Pharmacist Dosing Inpatient   Does not apply q1800   Continuous Infusions: . sodium chloride 10 mL (08/20/19 2025)  . sodium chloride    . sodium chloride     PRN Meds:.sodium chloride, sodium chloride, acetaminophen, alteplase, heparin, HYDROmorphone (DILAUDID) injection, levalbuterol, lidocaine (PF), lidocaine-prilocaine, pentafluoroprop-tetrafluoroeth, sodium chloride flush  Antimicrobials: Anti-infectives (From admission, onward)   Start     Dose/Rate Route Frequency Ordered Stop   09/09/19 1800  ceFAZolin (ANCEF) IVPB 2g/100 mL premix  Status:  Discontinued     2 g 200 mL/hr over 30 Minutes Intravenous Every T-Th-Sa (1800) 09/05/19 1024 09/18/19 0932   09/04/19 1800  ceFAZolin (ANCEF) IVPB 2g/100 mL premix     2 g 200 mL/hr over 30 Minutes Intravenous Every M-W-F (1800) 09/04/19 1335 09/06/19 1736   08/31/19 1800  ceFAZolin (ANCEF) IVPB 2g/100 mL  premix  Status:  Discontinued     2 g 200 mL/hr over 30 Minutes Intravenous Every T-Th-Sa (1800) 08/31/19 1401 09/04/19 1335   08/30/19 0900  ceFAZolin (ANCEF) IVPB 2g/100 mL premix     2 g 200 mL/hr over 30 Minutes Intravenous  Once 08/30/19 0854 08/30/19 1134   08/24/19 1200  ceFAZolin (ANCEF) IVPB 2g/100 mL premix  Status:  Discontinued     2 g 200 mL/hr over 30 Minutes Intravenous Every T-Th-Sa (Hemodialysis) 08/23/19 0807 08/31/19 1401   08/20/19 2200  ceFAZolin (ANCEF) IVPB 1 g/50 mL premix  Status:  Discontinued     1 g 100 mL/hr over 30 Minutes Intravenous Daily at bedtime 08/20/19 1507 08/23/19 0807   08/15/19 2200  ceFAZolin (ANCEF) IVPB 2g/100 mL premix  Status:  Discontinued     2 g 200 mL/hr over 30 Minutes Intravenous Daily at bedtime 08/15/19 1133 08/20/19 1507   08/11/19 1800  ceFAZolin (ANCEF) IVPB 2g/100 mL premix  Status:  Discontinued     2 g 200 mL/hr over 30 Minutes Intravenous Every 12 hours 08/11/19 1110 08/15/19 1133   08/10/19 1600  ceFAZolin (ANCEF) IVPB 1 g/50 mL premix  Status:  Discontinued     1 g 100 mL/hr over 30 Minutes Intravenous Every 12 hours 08/10/19 1530 08/11/19 1110   08/06/19 1800  ceFAZolin (ANCEF) IVPB 1 g/50 mL premix  Status:  Discontinued     1 g 100 mL/hr over 30 Minutes Intravenous Every 24 hours 08/06/19 1025 08/10/19 1530   08/05/19 1530  vancomycin (VANCOCIN) 1,750 mg in sodium chloride 0.9 % 500 mL IVPB     1,750 mg 250 mL/hr over 120 Minutes Intravenous  Once 08/05/19 1444 08/05/19 1736   08/05/19 1443  vancomycin variable dose per unstable renal function (pharmacist dosing)  Status:  Discontinued      Does not apply See admin instructions 08/05/19 1444 08/06/19 1025   08/03/19 1300  cefTRIAXone (ROCEPHIN) 2 g in sodium chloride 0.9 % 100 mL IVPB  Status:  Discontinued     2 g 200 mL/hr over 30 Minutes Intravenous Every 24 hours 08/03/19 1251 08/06/19 1025       I have personally reviewed the following labs and images: CBC:  Recent Labs  Lab 09/13/19 0459 09/15/19 0438 09/16/19 0728 09/17/19 0431  WBC 12.8* 13.3* 9.5 10.0  HGB 8.3* 8.4* 7.8* 8.3*  HCT 26.4* 26.7* 25.6* 27.0*  MCV 91.0 92.4 94.1 91.8  PLT 204 224 229 239   BMP &GFR Recent Labs  Lab 09/13/19 1745 09/15/19 1647 09/16/19 0728 09/17/19 0431 09/18/19 0336  NA 136 136 139 138 138  K 4.3 4.4 4.3 4.1 4.5  CL 96* 99 99 97* 99  CO2 27 25 26 27 25   GLUCOSE 111* 102* 96 106* 67*  BUN 30* 68* 78* 40* 65*  CREATININE 2.10* 3.69* 3.93* 2.42* 3.28*  CALCIUM 8.9 9.2 9.2 9.2 9.6  MG  --   --   --  1.8 2.1  PHOS 3.7 5.9* 6.7* 4.6 6.3*   Estimated Creatinine Clearance: 16 mL/min (A) (by C-G formula based on SCr of 3.28 mg/dL (H)). Liver & Pancreas: Recent Labs  Lab 09/13/19 1745 09/15/19 1647 09/16/19 0728 09/17/19 0431 09/18/19 0336  ALBUMIN 1.8* 1.8* 1.7* 1.8* 1.9*   No results for input(s): LIPASE, AMYLASE in the last 168 hours. No results for input(s): AMMONIA in the last 168 hours. Diabetic: No results for input(s): HGBA1C in the last 72 hours. Recent Labs  Lab 09/17/19 1934 09/17/19 2332 09/18/19 0414 09/18/19 0450 09/18/19 0747  GLUCAP 129* 155* 67* 95 89   Cardiac Enzymes: No results for input(s): CKTOTAL, CKMB, CKMBINDEX, TROPONINI in the last 168 hours. No results for input(s): PROBNP in the last 8760 hours. Coagulation Profile: Recent Labs  Lab 09/14/19 0406 09/15/19 0438 09/16/19 0347 09/17/19 0431 09/18/19 0336  INR 1.9* 2.0* 2.3* 1.9* 2.1*   Thyroid Function Tests: No results for input(s): TSH, T4TOTAL, FREET4, T3FREE, THYROIDAB in the last 72 hours. Lipid Profile: No results for input(s): CHOL, HDL, LDLCALC, TRIG, CHOLHDL, LDLDIRECT in the last 72 hours. Anemia Panel: No results for input(s): VITAMINB12, FOLATE, FERRITIN, TIBC, IRON, RETICCTPCT in the last 72 hours. Urine analysis:    Component Value Date/Time   COLORURINE BROWN (A) 08/05/2019 2018   APPEARANCEUR TURBID (A) 08/05/2019 2018    LABSPEC RESULTS UNAVAILABLE DUE TO INTERFERING SUBSTANCE 08/05/2019 2018   PHURINE RESULTS UNAVAILABLE DUE TO INTERFERING SUBSTANCE 08/05/2019 2018   GLUCOSEU RESULTS UNAVAILABLE DUE TO INTERFERING SUBSTANCE (A) 08/05/2019 2018   HGBUR RESULTS UNAVAILABLE DUE TO INTERFERING SUBSTANCE (A) 08/05/2019 2018   BILIRUBINUR RESULTS UNAVAILABLE DUE TO INTERFERING SUBSTANCE (A) 08/05/2019 2018   KETONESUR RESULTS UNAVAILABLE DUE TO INTERFERING SUBSTANCE (A) 08/05/2019 2018   PROTEINUR RESULTS UNAVAILABLE DUE TO INTERFERING SUBSTANCE (A) 08/05/2019 2018   UROBILINOGEN 0.2 05/07/2009 1520   NITRITE RESULTS UNAVAILABLE DUE TO INTERFERING SUBSTANCE (A) 08/05/2019 2018   LEUKOCYTESUR RESULTS UNAVAILABLE DUE TO INTERFERING SUBSTANCE (A) 08/05/2019 2018   Sepsis Labs: Invalid input(s): PROCALCITONIN, [place]  Microbiology: Recent Results (from the past 240 hour(s))  MRSA PCR Screening     Status: None   Collection Time: 09/12/19  3:42 PM   Specimen: Nasopharyngeal  Result Value Ref Range Status   MRSA by PCR NEGATIVE NEGATIVE Final    Comment:        The GeneXpert MRSA Assay (FDA approved for NASAL specimens only), is one component of a comprehensive MRSA colonization surveillance program. It is not intended to diagnose MRSA infection nor to guide or monitor treatment for MRSA infections. Performed at [facility], [place] [address], [place], [place] [address]     Radiology Studies: No results found.  Total time spent 28 minutes Darliss Cheney, MD [facility]  If 7PM-7AM, please contact night-coverage [email or web address] Password [facility] 09/18/2019, 9:41 AM
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
## 2019-09-19 LAB — GLUCOSE, CAPILLARY
Glucose-Capillary: 107 mg/dL — ABNORMAL HIGH (ref 70–99)
Glucose-Capillary: 117 mg/dL — ABNORMAL HIGH (ref 70–99)
Glucose-Capillary: 78 mg/dL (ref 70–99)
Glucose-Capillary: 83 mg/dL (ref 70–99)
Glucose-Capillary: 96 mg/dL (ref 70–99)

## 2019-09-19 LAB — CBC
HCT: 27.5 % — ABNORMAL LOW (ref 36.0–46.0)
Hemoglobin: 8.4 g/dL — ABNORMAL LOW (ref 12.0–15.0)
MCH: 28.5 pg (ref 26.0–34.0)
MCHC: 30.5 g/dL (ref 30.0–36.0)
MCV: 93.2 fL (ref 80.0–100.0)
Platelets: 259 10*3/uL (ref 150–400)
RBC: 2.95 MIL/uL — ABNORMAL LOW (ref 3.87–5.11)
RDW: 17.4 % — ABNORMAL HIGH (ref 11.5–15.5)
WBC: 10.6 10*3/uL — ABNORMAL HIGH (ref 4.0–10.5)
nRBC: 0 % (ref 0.0–0.2)

## 2019-09-19 LAB — PROTIME-INR
INR: 2.4 — ABNORMAL HIGH (ref 0.8–1.2)
Prothrombin Time: 26.4 seconds — ABNORMAL HIGH (ref 11.4–15.2)

## 2019-09-19 LAB — RENAL FUNCTION PANEL
Albumin: 1.7 g/dL — ABNORMAL LOW (ref 3.5–5.0)
Anion gap: 16 — ABNORMAL HIGH (ref 5–15)
BUN: 88 mg/dL — ABNORMAL HIGH (ref 8–23)
CO2: 24 mmol/L (ref 22–32)
Calcium: 9.3 mg/dL (ref 8.9–10.3)
Chloride: 98 mmol/L (ref 98–111)
Creatinine, Ser: 4.32 mg/dL — ABNORMAL HIGH (ref 0.44–1.00)
GFR calc Af Amer: 11 mL/min — ABNORMAL LOW (ref >60)
GFR calc non Af Amer: 10 mL/min — ABNORMAL LOW (ref >60)
Glucose, Bld: 112 mg/dL — ABNORMAL HIGH (ref 70–99)
Phosphorus: 6 mg/dL — ABNORMAL HIGH (ref 2.5–4.6)
Potassium: 4.6 mmol/L (ref 3.5–5.1)
Sodium: 138 mmol/L (ref 135–145)

## 2019-09-19 LAB — MAGNESIUM: Magnesium: 1.9 mg/dL (ref 1.7–2.4)

## 2019-09-19 MED ORDER — WARFARIN SODIUM 2 MG PO TABS
3.0000 mg | ORAL_TABLET | Freq: Once | ORAL | Status: AC
  Administered 2019-09-19: 17:00:00 3 mg via ORAL
  Filled 2019-09-19 (×2): qty 1

## 2019-09-19 MED ORDER — HEPARIN SODIUM (PORCINE) 1000 UNIT/ML IJ SOLN
INTRAMUSCULAR | Status: AC
  Administered 2019-09-19: 3000 [IU] via INTRAVENOUS_CENTRAL
  Filled 2019-09-19: qty 3

## 2019-09-19 MED ORDER — HEPARIN SODIUM (PORCINE) 1000 UNIT/ML DIALYSIS
40.0000 [IU]/kg | INTRAMUSCULAR | Status: DC | PRN
  Administered 2019-09-19: 08:00:00 3000 [IU] via INTRAVENOUS_CENTRAL
  Filled 2019-09-19: qty 3

## 2019-09-19 MED ORDER — HEPARIN SODIUM (PORCINE) 1000 UNIT/ML IJ SOLN
INTRAMUSCULAR | Status: AC
  Administered 2019-09-19: 3200 [IU] via INTRAVENOUS_CENTRAL
  Filled 2019-09-19: qty 4

## 2019-09-19 NOTE — Progress Notes (Signed)
Nutrition Follow-up  DOCUMENTATION CODES:   Non-severe (moderate) malnutrition in context of acute illness/injury  INTERVENTION:   -Boost Breeze po TID, each supplement provides 250 kcal and 9 grams of protein -Nepro Shake poBID, each supplement provides 425 kcal and 19 grams protein -Rena-vit daily -Prostat liquid protein PO 30 ml BID with meals, each supplement provides 100 kcal, 15 grams protein.  NUTRITION DIAGNOSIS:   Moderate Malnutrition related to acute illness(acute respiratory failure, AKI required CRRT, endocarditis) as evidenced by mild fat depletion, mild muscle depletion, moderate muscle depletion.  Ongoing.  GOAL:   Patient will meet greater than or equal to 90% of their needs  Progressing.  MONITOR:   TF tolerance, Diet advancement, Labs, Weight trends, Skin  ASSESSMENT:   73 year old female who presented to the ED on 10/22 after cardiac arrest. PMH anemia, CHF, CKD stage IV, HTN, IDDM. Pt required intubation in the ED. TTM initiated.  10/22 -Admit, Intubated, normothermia TTM 10/24 - rewarmed, TF initiated 10/25 - HD catheter placed, first HD 10/27 - HD 10/28 - s/p TEE suspicious for vegetation 10/30 - CRRT initiated 11/01 - CRRT discontinued, extubated 11/03 -Cortrak tube placed, TF initiated 11/11- s/p HD cath placement 11/13- s/p MBSS- advanced to dysphagia 1 diet with thin liquids  **RD working remotely**  Patient documented as consuming 100% of lunch yesterday. No other POs documented. Pt is drinking Boost Breeze and at least 1 Nepro daily with Prostats.   Admission weight: 192 lbs. Current weight: 164lbs. I/Os:-1.9Lsince 11/24  Currently in HD at this time.  Medications: Ferrous sulfate tablet, Rena-Vit tablet, Thiamine tablet Labs reviewed: CBGs: 78-107 Phos 6.0  Diet Order:   Diet Order            DIET - DYS 1 Room service appropriate? Yes; Fluid consistency: Thin  Diet effective now              EDUCATION NEEDS:    Not appropriate for education at this time  Skin:  Skin Assessment: Reviewed RN Assessment Skin Integrity Issues:: DTI DTI: -  Last BM:  12/8 -type 6  Height:   Ht Readings from Last 1 Encounters:  08/03/19 5\' 6"  (1.676 m)    Weight:   Wt Readings from Last 1 Encounters:  09/19/19 74.7 kg    Ideal Body Weight:  59.1 kg  BMI:  Body mass index is 26.58 kg/m.  Estimated Nutritional Needs:   Kcal:  [id number]  Protein:  89-105  Fluid:  >/= 1.5 L  Clayton Bibles, MS, RD, LDN Inpatient Clinical Dietitian Pager: [phone] After Hours Pager: [phone]

## 2019-09-19 NOTE — Progress Notes (Signed)
PROGRESS NOTE  Jaclyn Day [id number] DOB: 12/13/1945   PCP: Cyndi Bender, PA-C  Patient is from: Home  DOA: 08/03/2019 LOS: 55  Brief Narrative / Interim history: 73 year old female with history of congenital unilateral kidney, CKD-4, HTN, persistent A. fib on Eliquis and diastolic CHF, depression/anxiety/schizoaffective disorder and developmental disorder admitted to ICU 08/03/2023 cardiac arrest.  Found to have new systolic CHF with EF of 20 to 25%, global hypokinesis.  Blood culture grew MSSA for which she was started on vancomycin and transition to Ancef on 08/06/2019.  Patient had echocardiogram on 08/21/2019 that showed EF of 55 to 60%, moderate LAE and RVSP to 39.  Hospital course complicated by A. fib with RVR, acute metabolic encephalopathy/agitation and AKI.    Assessment & Plan: AKI/CKD-4: due to shock from cardiac issues and bacteremia.  Makes fair amount of urine. -Right IJ TDC on 11/11.  Turned down for permanent access due to debility -CRRT 10/30-11/1>>IHD TTS -Monitor urine output -Nephrology managing.  Metabolic acidosis/bone mineral disorder/hyperkalemia: due to renal failure. -Nephrology managing  Acute respiratory failure with hypoxia "multifactorial including cardiac arrest, pulmonary edema, bilateral pleural effusion and anxiety.  Resolved.  She is now on room air. -treat treatable causes.  MSSA bacteremia/AV endocarditis: Bandemia improving.  Remains afebrile.  Leukocytosis improving. -Continue Ancef until 09/17/2020 per ID-total of 6 weeks.  Discontinue after today's dose.  Leukocytosis/bandemia: Resolved.  She remains afebrile.  Monitor CBC every 48 hours.  V. tach/cardiac arrest/acute systolic CHF: Initial TTE on 10/23 with EF of 20 to 25%.  Repeat TTE on 11/9 with EF of 55 to 60%.  Currently euvolemic except for stable trace LUE edema. -S/p ETT and hypothermia protocol in ICU -Fluid management by HD.  Acute metabolic encephalopathy:  Resolved.  Oriented fairly but limited insight -Frequent reorientation and delirium precautions.  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  Patient with significant upper and lower extremity weakness left greater than right mainly in upper extremities. -Continue warfarin per pharmacy .  INR therapeutic.  Persistent A. fib: She continues to remain in A. Fib but heart rate is much better controlled.  Continue amiodarone and low-dose metoprolol 12.5 mg p.o. twice daily.  Coumadin per pharmacy.  INR therapeutic.   Anemia: Multifactorial-acute blood loss from HD cath 11/15, chronic disease and malnutrition. -Hgb 11.6 (admit & b/l)>> 6.9 (11/10)>1u> 8.2>> 5.9 (11/16)> 8.8>> 7.6>1u>8.8>> 8.4> 7.8> 8.3> 8.4 -On Aranesp and iron per nephro -Optimize nutrition -Monitor H&H  Hypotension: Slightly hypotensive during hemodialysis but not symptomatic. -Off midodrine.   Dysphagia: SLP and dietitian following.   -Appreciate SLP input-continue dysphagia 1 diet -Cortrak removed 12/4. -Needs assistance with feeding.  Oral thrush: Resolved.   Vitamin D deficiency -On weekly vitamin D 50,000 units  Stage I sacral decubitus ulcer: Not present on admission -Dressing and offloading per RN  Anxiety/depression/schizoaffective disorder/developmental disorder -Continue Depakote and Abilify -Cymbalta has been on hold.  Moderate malnutrition: Nutrition Problem: Moderate Malnutrition Etiology: acute illness(acute respiratory failure, AKI required CRRT, endocarditis)  Signs/Symptoms: mild fat depletion, mild muscle depletion, moderate muscle depletion  Interventions: Supplements and assistance with feeding.   DVT prophylaxis: Subcu heparin Code Status: Full code Family Communication: No family present.  Patient alert and oriented.  Called patient's significant other [name] Stutts twice today and left voicemail.  No call returned yet.    Disposition Plan: Remains inpatient.  Final disposition LTAC or SNF. Turned down by [facility].  Per Education officer, museum, her significant other has  been contacted by them and has been informed that patient is ready to be discharged to skilled nursing facility however he would not pick a facility where he will not be allowed to visit her and currently, there is no facility which allows visitors. Consultants: Cardiology (signed off), neurology (signed off), IR (signed off), palliative, nephrology   Subjective: Patient seen and examined in dialysis unit.  She had no complaints.  No shortness of breath or chest pain.  Objective: Vitals:   09/19/19 0010 09/19/19 0200 09/19/19 0415 09/19/19 0645  BP: 129/78  122/71   Pulse: 90  82   Resp: (!) 31 19 (!) 22   Temp: 97.6 F (36.4 C)  97.6 F (36.4 C)   TempSrc: Axillary  Oral   SpO2: 100%  98%   Weight:    75.1 kg  Height:        Intake/Output Summary (Last 24 hours) at 09/19/2019 0826 Last data filed at 09/18/2019 2215 Gross per 24 hour  Intake 238 ml  Output --  Net 238 ml   Filed Weights   09/17/19 0225 09/18/19 0420 09/19/19 0645  Weight: 73.9 kg 74 kg 75.1 kg    Examination:  General exam: Appears calm and comfortable  Respiratory system: Clear to auscultation. Respiratory effort normal. Cardiovascular system: S1 & S2 heard, irregularly irregular rate and rhythm. No JVD, murmurs, rubs, gallops or clicks. No pedal edema. Gastrointestinal system: Abdomen is nondistended, soft and nontender. No organomegaly or masses felt. Normal bowel sounds heard. Central nervous system: Alert and oriented. No focal neurological deficits. Extremities: Symmetric 5 x 5 power. Skin: No rashes, lesions or ulcers.  Psychiatry: Judgement and insight appear poor. Mood & affect flat.  Procedures:  HD cath placement CRRT IHD  Microbiology summarized: 10/22-COVID-19 negative 10/22-MRSA PCR negative 10/22-respiratory viral panel negative 10/22-urine  culture with E. Coli 10/22-blood culture with MSSA in 1/2 bottles 10/26, 10/29, 11/16 and 11/20-blood cultures negative  Sch Meds:  Scheduled Meds:   stroke: mapping our early stages of recovery book   Does not apply Once   amiodarone  200 mg Oral Daily   ARIPiprazole  10 mg Oral BID   atorvastatin  20 mg Oral Daily   Chlorhexidine Gluconate Cloth  6 each Topical Q0600   coumadin book   Does not apply Once   darbepoetin (ARANESP) injection - DIALYSIS  100 mcg Intravenous Q Sat-HD   feeding supplement  1 Container Oral TID BM   feeding supplement (NEPRO CARB STEADY)  237 mL Oral BID BM   feeding supplement (PRO-STAT SUGAR FREE 64)  30 mL Oral BID   ferrous sulfate  325 mg Oral Daily   insulin aspart  0-9 Units Subcutaneous Q4H   mouth rinse  15 mL Mouth Rinse BID   metoprolol tartrate  12.5 mg Oral BID   multivitamin  1 tablet Oral QHS   pantoprazole  40 mg Oral Daily   sodium chloride flush  3 mL Intravenous Q12H   thiamine  100 mg Oral Daily   valproic acid  250 mg Oral Q6H   warfarin  3 mg Oral ONCE-1800   Warfarin - Pharmacist Dosing Inpatient   Does not apply q1800   Continuous Infusions:  PRN Meds:.acetaminophen, heparin, heparin, HYDROmorphone (DILAUDID) injection, levalbuterol, lidocaine (PF), lidocaine-prilocaine, pentafluoroprop-tetrafluoroeth, sodium chloride flush  Antimicrobials: Anti-infectives (From admission, onward)   Start     Dose/Rate Route Frequency Ordered Stop   09/09/19 1800  ceFAZolin (ANCEF) IVPB 2g/100 mL premix  Status:  Discontinued     2 g 200 mL/hr over 30 Minutes Intravenous Every T-Th-Sa (1800) 09/05/19 1024 09/18/19 0932   09/04/19 1800  ceFAZolin (ANCEF) IVPB 2g/100 mL premix     2 g 200 mL/hr over 30 Minutes Intravenous Every M-W-F (1800) 09/04/19 1335 09/06/19 1736   08/31/19 1800  ceFAZolin (ANCEF) IVPB 2g/100 mL premix  Status:  Discontinued     2 g 200 mL/hr over 30 Minutes Intravenous Every T-Th-Sa (1800) 08/31/19  1401 09/04/19 1335   08/30/19 0900  ceFAZolin (ANCEF) IVPB 2g/100 mL premix     2 g 200 mL/hr over 30 Minutes Intravenous  Once 08/30/19 0854 08/30/19 1134   08/24/19 1200  ceFAZolin (ANCEF) IVPB 2g/100 mL premix  Status:  Discontinued     2 g 200 mL/hr over 30 Minutes Intravenous Every T-Th-Sa (Hemodialysis) 08/23/19 0807 08/31/19 1401   08/20/19 2200  ceFAZolin (ANCEF) IVPB 1 g/50 mL premix  Status:  Discontinued     1 g 100 mL/hr over 30 Minutes Intravenous Daily at bedtime 08/20/19 1507 08/23/19 0807   08/15/19 2200  ceFAZolin (ANCEF) IVPB 2g/100 mL premix  Status:  Discontinued     2 g 200 mL/hr over 30 Minutes Intravenous Daily at bedtime 08/15/19 1133 08/20/19 1507   08/11/19 1800  ceFAZolin (ANCEF) IVPB 2g/100 mL premix  Status:  Discontinued     2 g 200 mL/hr over 30 Minutes Intravenous Every 12 hours 08/11/19 1110 08/15/19 1133   08/10/19 1600  ceFAZolin (ANCEF) IVPB 1 g/50 mL premix  Status:  Discontinued     1 g 100 mL/hr over 30 Minutes Intravenous Every 12 hours 08/10/19 1530 08/11/19 1110   08/06/19 1800  ceFAZolin (ANCEF) IVPB 1 g/50 mL premix  Status:  Discontinued     1 g 100 mL/hr over 30 Minutes Intravenous Every 24 hours 08/06/19 1025 08/10/19 1530   08/05/19 1530  vancomycin (VANCOCIN) 1,750 mg in sodium chloride 0.9 % 500 mL IVPB     1,750 mg 250 mL/hr over 120 Minutes Intravenous  Once 08/05/19 1444 08/05/19 1736   08/05/19 1443  vancomycin variable dose per unstable renal function (pharmacist dosing)  Status:  Discontinued      Does not apply See admin instructions 08/05/19 1444 08/06/19 1025   08/03/19 1300  cefTRIAXone (ROCEPHIN) 2 g in sodium chloride 0.9 % 100 mL IVPB  Status:  Discontinued     2 g 200 mL/hr over 30 Minutes Intravenous Every 24 hours 08/03/19 1251 08/06/19 1025       I have personally reviewed the following labs and images: CBC: Recent Labs  Lab 09/13/19 0459 09/15/19 0438 09/16/19 0728 09/17/19 0431 09/19/19 0355  WBC 12.8*  13.3* 9.5 10.0 10.6*  HGB 8.3* 8.4* 7.8* 8.3* 8.4*  HCT 26.4* 26.7* 25.6* 27.0* 27.5*  MCV 91.0 92.4 94.1 91.8 93.2  PLT 204 224 229 239 259   BMP &GFR Recent Labs  Lab 09/13/19 1745 09/15/19 1647 09/16/19 0728 09/17/19 0431 09/18/19 0336 09/19/19 0355  NA 136 136 139 138 138  --   K 4.3 4.4 4.3 4.1 4.5  --   CL 96* 99 99 97* 99  --   CO2 27 25 26 27 25   --   GLUCOSE 111* 102* 96 106* 67*  --   BUN 30* 68* 78* 40* 65*  --   CREATININE 2.10* 3.69* 3.93* 2.42* 3.28*  --   CALCIUM 8.9 9.2 9.2 9.2 9.6  --   MG  --   --   --  1.8 2.1 1.9  PHOS 3.7 5.9* 6.7* 4.6 6.3*  --    Estimated Creatinine Clearance: 16.1 mL/min (A) (by C-G formula based on SCr of 3.28 mg/dL (H)). Liver & Pancreas: Recent Labs  Lab 09/13/19 1745 09/15/19 1647 09/16/19 0728 09/17/19 0431 09/18/19 0336  ALBUMIN 1.8* 1.8* 1.7* 1.8* 1.9*   No results for input(s): LIPASE, AMYLASE in the last 168 hours. No results for input(s): AMMONIA in the last 168 hours. Diabetic: No results for input(s): HGBA1C in the last 72 hours. Recent Labs  Lab 09/18/19 1640 09/18/19 2005 09/19/19 0005 09/19/19 0412 09/19/19 0733  GLUCAP 82 123* 117* 78 107*   Cardiac Enzymes: No results for input(s): CKTOTAL, CKMB, CKMBINDEX, TROPONINI in the last 168 hours. No results for input(s): PROBNP in the last 8760 hours. Coagulation Profile: Recent Labs  Lab 09/15/19 0438 09/16/19 0347 09/17/19 0431 09/18/19 0336 09/19/19 0355  INR 2.0* 2.3* 1.9* 2.1* 2.4*   Thyroid Function Tests: No results for input(s): TSH, T4TOTAL, FREET4, T3FREE, THYROIDAB in the last 72 hours. Lipid Profile: No results for input(s): CHOL, HDL, LDLCALC, TRIG, CHOLHDL, LDLDIRECT in the last 72 hours. Anemia Panel: No results for input(s): VITAMINB12, FOLATE, FERRITIN, TIBC, IRON, RETICCTPCT in the last 72 hours. Urine analysis:    Component Value Date/Time   COLORURINE BROWN (A) 08/05/2019 2018   APPEARANCEUR TURBID (A) 08/05/2019 2018    LABSPEC RESULTS UNAVAILABLE DUE TO INTERFERING SUBSTANCE 08/05/2019 2018   PHURINE RESULTS UNAVAILABLE DUE TO INTERFERING SUBSTANCE 08/05/2019 2018   GLUCOSEU RESULTS UNAVAILABLE DUE TO INTERFERING SUBSTANCE (A) 08/05/2019 2018   HGBUR RESULTS UNAVAILABLE DUE TO INTERFERING SUBSTANCE (A) 08/05/2019 2018   BILIRUBINUR RESULTS UNAVAILABLE DUE TO INTERFERING SUBSTANCE (A) 08/05/2019 2018   KETONESUR RESULTS UNAVAILABLE DUE TO INTERFERING SUBSTANCE (A) 08/05/2019 2018   PROTEINUR RESULTS UNAVAILABLE DUE TO INTERFERING SUBSTANCE (A) 08/05/2019 2018   UROBILINOGEN 0.2 05/07/2009 1520   NITRITE RESULTS UNAVAILABLE DUE TO INTERFERING SUBSTANCE (A) 08/05/2019 2018   LEUKOCYTESUR RESULTS UNAVAILABLE DUE TO INTERFERING SUBSTANCE (A) 08/05/2019 2018   Sepsis Labs: Invalid input(s): PROCALCITONIN, [place]  Microbiology: Recent Results (from the past 240 hour(s))  MRSA PCR Screening     Status: None   Collection Time: 09/12/19  3:42 PM   Specimen: Nasopharyngeal  Result Value Ref Range Status   MRSA by PCR NEGATIVE NEGATIVE Final    Comment:        The GeneXpert MRSA Assay (FDA approved for NASAL specimens only), is one component of a comprehensive MRSA colonization surveillance program. It is not intended to diagnose MRSA infection nor to guide or monitor treatment for MRSA infections. Performed at [facility], [place] [address] [place], [place] [address]     Radiology Studies: No results found.  Total time spent 29 minutes Darliss Cheney, MD [facility]  If 7PM-7AM, please contact night-coverage [email or web address] Password [facility] 09/19/2019, 8:26 AM

## 2019-09-19 NOTE — Progress Notes (Signed)
Palliative Medicine RN Note: Our team continues to shadow Korey's chart for palliative-related needs or concerns. No place for re-engagement at this time.  Marjie Skiff Dayan Kreis, RN, BSN, [facility] Palliative Medicine Team 09/19/2019 1:00 PM Office [phone]

## 2019-09-19 NOTE — Progress Notes (Signed)
Patient ID: Jaclyn Day, female   DOB: 09/07/1946, 73 y.o.   MRN: [id number]  [facility] Progress Note    Assessment/ Plan:   1. Acute kidney Injury on chronic kidney disease stage IV: Oliguric and appears consistent with ATN following cardiac arrest and MSSA bacteremia.  Anuric and without renal recovery--likely heading toward ESRD.  Currently getting hemodialysis via right IJ TDC on a TTS schedule (permanent access precluded by bacteremia/clinical status when earlier seen by vascular surgery).  Continue to follow progress with PT/OT to evaluate ability to undertake outpatient hemodialysis in recliner along with transfers.   2.  MSSA bacteremia/sepsis: Remains on intravenous Ancef, source suspected to be from cellulitis of the lower extremities. 3.  Status post cardiac arrest: With significant deconditioning during his hospitalization. 4.  History of paroxysmal atrial fibrillation: Intermittent tachycardia noted, remains on amiodarone with anticoagulation per cardiology. 5.  Protein calorie malnutrition/deconditioning following acute illness: Anticipate prolonged rehabilitation course and may need LTAC (reportedly declined for admission).  Subjective:   No acute events overnight, denies any complaints.  Worked with PT yesterday and sat in recliner .   Objective:   BP (!) 93/46   Pulse 79   Temp 97.6 F (36.4 C) (Oral)   Resp (!) 24   Ht 5\' 6"  (1.676 m)   Wt 74.7 kg   SpO2 97%   BMI 26.58 kg/m   Intake/Output Summary (Last 24 hours) at 09/19/2019 0945 Last data filed at 09/18/2019 2215 Gross per 24 hour  Intake 238 ml  Output -  Net 238 ml   Weight change: 1.1 kg  Physical Exam: Gen: Resting comfortably in dialysis CVS: Pulse irregularly irregular- normal rate, S1 and S2 normal Resp: Anteriorly clear to auscultation without distinct rales/wheeze. RIJ TDC Abd: Soft, obese, nontender Ext: Trace-1+ dependent/lower extremity edema  Imaging: No results  found.  Labs: BMET Recent Labs  Lab 09/13/19 0459 09/13/19 1745 09/15/19 1647 09/16/19 0728 09/17/19 0431 09/18/19 0336 09/19/19 0808  NA 135 136 136 139 138 138 138  K 5.2* 4.3 4.4 4.3 4.1 4.5 4.6  CL 95* 96* 99 99 97* 99 98  CO2 25 27 25 26 27 25 24   GLUCOSE 98 111* 102* 96 106* 67* 112*  BUN 85* 30* 68* 78* 40* 65* 88*  CREATININE 4.32* 2.10* 3.69* 3.93* 2.42* 3.28* 4.32*  CALCIUM 9.1 8.9 9.2 9.2 9.2 9.6 9.3  PHOS 6.1* 3.7 5.9* 6.7* 4.6 6.3* 6.0*   CBC Recent Labs  Lab 09/15/19 0438 09/16/19 0728 09/17/19 0431 09/19/19 0355  WBC 13.3* 9.5 10.0 10.6*  HGB 8.4* 7.8* 8.3* 8.4*  HCT 26.7* 25.6* 27.0* 27.5*  MCV 92.4 94.1 91.8 93.2  PLT 224 229 239 259    Medications:    .  stroke: mapping our early stages of recovery book   Does not apply Once  . amiodarone  200 mg Oral Daily  . ARIPiprazole  10 mg Oral BID  . atorvastatin  20 mg Oral Daily  . Chlorhexidine Gluconate Cloth  6 each Topical Q0600  . coumadin book   Does not apply Once  . darbepoetin (ARANESP) injection - DIALYSIS  100 mcg Intravenous Q Sat-HD  . feeding supplement  1 Container Oral TID BM  . feeding supplement (NEPRO CARB STEADY)  237 mL Oral BID BM  . feeding supplement (PRO-STAT SUGAR FREE 64)  30 mL Oral BID  . ferrous sulfate  325 mg Oral Daily  . insulin aspart  0-9 Units Subcutaneous Q4H  .  mouth rinse  15 mL Mouth Rinse BID  . metoprolol tartrate  12.5 mg Oral BID  . multivitamin  1 tablet Oral QHS  . pantoprazole  40 mg Oral Daily  . sodium chloride flush  3 mL Intravenous Q12H  . thiamine  100 mg Oral Daily  . valproic acid  250 mg Oral Q6H  . warfarin  3 mg Oral ONCE-1800  . Warfarin - Pharmacist Dosing Inpatient   Does not apply [id number]   Elmarie Shiley, MD 09/19/2019, 9:45 AM

## 2019-09-19 NOTE — Procedures (Signed)
Patient seen on Hemodialysis. BP (!) 93/46   Pulse 79   Temp 97.6 F (36.4 C) (Oral)   Resp (!) 24   Ht 5\' 6"  (1.676 m)   Wt 74.7 kg   SpO2 97%   BMI 26.58 kg/m   QB 400, UF goal 3L Tolerating treatment without complaints at this time.   Elmarie Shiley MD [facility]. Office # [phone] Pager # [phone] 9:47 AM

## 2019-09-19 NOTE — Progress Notes (Signed)
ANTICOAGULATION CONSULT NOTE - Follow-Up Consult  Pharmacy Consult for Warfarin Indication: atrial fibrillation  Allergies  Allergen Reactions  . Codeine Other (See Comments)    Reaction not recalled by family- was told to never take this    Patient Measurements: Height: 5\' 6"  (167.6 cm) Weight: 165 lb 9.1 oz (75.1 kg) IBW/kg (Calculated) : 59.3  Vital Signs: Temp: 97.6 F (36.4 C) (12/08 0415) Temp Source: Oral (12/08 0415) BP: 122/71 (12/08 0415) Pulse Rate: 82 (12/08 0415)  Labs: Recent Labs    09/17/19 0431 09/18/19 0336 09/19/19 0355  HGB 8.3*  --  8.4*  HCT 27.0*  --  27.5*  PLT 239  --  259  LABPROT 21.5* 23.1* 26.4*  INR 1.9* 2.1* 2.4*  CREATININE 2.42* 3.28*  --     Estimated Creatinine Clearance: 16.1 mL/min (A) (by C-G formula based on SCr of 3.28 mg/dL (H)).   Medical History: Past Medical History:  Diagnosis Date  . Anemia   . Chronic diastolic CHF (congestive heart failure) ([place])   . CKD (chronic kidney disease), stage IV ([place])   . Hypertension   . Persistent atrial fibrillation ([place])    a. dx 01/2019 s/p TEE DCCV.  Marland Kitchen Renal disorder   . Unilateral congenital absence of kidney    Assessment: Pt is a 73 yr old female admitted s/p out of hospital cardiac arrest and TTM. Admit c/b by encephalopathy, AKI which progressed to HD, and ?embolic CVA. Pt on apixaban PTA for Afib, which was held for IV heparin. Heparin subsequently held on 11/16 due to bleeding from HD lines and low Hgb. Warfarin started 11/18 with no bridge planned. Patient on amiodarone PO   -Today's INR 2.4  Goal of Therapy:  INR 2-3 Monitor platelets by anticoagulation protocol: Yes   Plan:  Warfarin 3 mg po x 1. Daily INR.  Talecia Sherlin A. Levada Dy, PharmD, BCPS, FNKF Clinical Pharmacist [place] Please utilize Amion for appropriate phone number to reach the unit pharmacist ([place])    09/19/2019 8:24 AM

## 2019-09-20 LAB — GLUCOSE, CAPILLARY
Glucose-Capillary: 130 mg/dL — ABNORMAL HIGH (ref 70–99)
Glucose-Capillary: 86 mg/dL (ref 70–99)
Glucose-Capillary: 79 mg/dL (ref 70–99)
Glucose-Capillary: 107 mg/dL — ABNORMAL HIGH (ref 70–99)
Glucose-Capillary: 87 mg/dL (ref 70–99)
Glucose-Capillary: 135 mg/dL — ABNORMAL HIGH (ref 70–99)

## 2019-09-20 LAB — PROTIME-INR
INR: 2.3 — ABNORMAL HIGH (ref 0.8–1.2)
Prothrombin Time: 25.4 seconds — ABNORMAL HIGH (ref 11.4–15.2)

## 2019-09-20 LAB — MAGNESIUM: Magnesium: 1.9 mg/dL (ref 1.7–2.4)

## 2019-09-20 MED ORDER — WARFARIN SODIUM 2 MG PO TABS
3.0000 mg | ORAL_TABLET | Freq: Once | ORAL | Status: AC
  Administered 2019-09-20: 3 mg via ORAL
  Filled 2019-09-20: qty 1

## 2019-09-20 NOTE — Plan of Care (Signed)
  Problem: Education: Goal: Knowledge of General Education information will improve Description: Including pain rating scale, medication(s)/side effects and non-pharmacologic comfort measures Outcome: Progressing   Problem: Clinical Measurements: Goal: Ability to maintain clinical measurements within normal limits will improve Outcome: Progressing Goal: Will remain free from infection Outcome: Progressing Goal: Diagnostic test results will improve Outcome: Progressing Goal: Respiratory complications will improve Outcome: Progressing Goal: Cardiovascular complication will be avoided Outcome: Progressing   Problem: Activity: Goal: Risk for activity intolerance will decrease Outcome: Progressing   Problem: Nutrition: Goal: Adequate nutrition will be maintained Outcome: Progressing   Problem: Elimination: Goal: Will not experience complications related to bowel motility Outcome: Progressing Goal: Will not experience complications related to urinary retention Outcome: Progressing   Problem: Safety: Goal: Ability to remain free from injury will improve Outcome: Progressing   Problem: Skin Integrity: Goal: Risk for impaired skin integrity will decrease Outcome: Progressing   Problem: Education: Goal: Knowledge of disease or condition will improve Outcome: Progressing Goal: Knowledge of secondary prevention will improve Outcome: Progressing   Problem: Coping: Goal: Will verbalize positive feelings about self Outcome: Progressing Goal: Will identify appropriate support needs Outcome: Progressing   Problem: Health Behavior/Discharge Planning: Goal: Ability to manage health-related needs will improve Outcome: Progressing   Problem: Nutrition: Goal: Risk of aspiration will decrease Outcome: Progressing Goal: Dietary intake will improve Outcome: Progressing   Elesa Hacker, RN

## 2019-09-20 NOTE — Progress Notes (Signed)
Patient ID: Jaclyn Day, female   DOB: Jul 26, 1946, 73 y.o.   MRN: [id number]  [facility] Progress Note    Assessment/ Plan:   1. Acute kidney Injury on chronic kidney disease stage IV-suspect now evolved to ESRD: Etiology appears consistent with ATN following cardiac arrest and MSSA bacteremia.  Currently getting hemodialysis via right IJ TDC on a TTS schedule (permanent access precluded by bacteremia/clinical status when earlier seen by vascular surgery).  Continue to follow progress with PT/OT to evaluate ability to undertake outpatient hemodialysis in recliner along with transfers.   2.  MSSA bacteremia/sepsis: Remains on intravenous Ancef, source suspected to be from cellulitis of the lower extremities. 3.  Status post cardiac arrest: With significant deconditioning during his hospitalization.  Anticipate continued PT/OT. 4.  History of paroxysmal atrial fibrillation: Rate controlled, remains on amiodarone with anticoagulation per cardiology. 5.  Protein calorie malnutrition/deconditioning following acute illness: Anticipate prolonged rehabilitation course and may need LTAC (reportedly declined for admission).  Subjective:   No acute events overnight, denies any complaints.  Watched the football game last night and looking forward to sitting in recliner today.   Objective:   BP (!) 110/52   Pulse 80   Temp 98.2 F (36.8 C) (Oral)   Resp (!) 21   Ht 5\' 6"  (1.676 m)   Wt 73.6 kg   SpO2 96%   BMI 26.19 kg/m   Intake/Output Summary (Last 24 hours) at 09/20/2019 1013 Last data filed at 09/19/2019 1848 Gross per 24 hour  Intake 50 ml  Output 1973 ml  Net -1923 ml   Weight change: -0.4 kg  Physical Exam: Gen: Resting comfortably in bed, boyfriend at bedside CVS: Pulse irregularly irregular- normal rate, S1 and S2 normal Resp: Anteriorly clear to auscultation without distinct rales/wheeze. RIJ TDC Abd: Soft, obese, nontender Ext: Trace-1+ dependent/lower  extremity edema  Imaging: No results found.  Labs: BMET Recent Labs  Lab 09/13/19 1745 09/15/19 1647 09/16/19 0728 09/17/19 0431 09/18/19 0336 09/19/19 0808  NA 136 136 139 138 138 138  K 4.3 4.4 4.3 4.1 4.5 4.6  CL 96* 99 99 97* 99 98  CO2 27 25 26 27 25 24   GLUCOSE 111* 102* 96 106* 67* 112*  BUN 30* 68* 78* 40* 65* 88*  CREATININE 2.10* 3.69* 3.93* 2.42* 3.28* 4.32*  CALCIUM 8.9 9.2 9.2 9.2 9.6 9.3  PHOS 3.7 5.9* 6.7* 4.6 6.3* 6.0*   CBC Recent Labs  Lab 09/15/19 0438 09/16/19 0728 09/17/19 0431 09/19/19 0355  WBC 13.3* 9.5 10.0 10.6*  HGB 8.4* 7.8* 8.3* 8.4*  HCT 26.7* 25.6* 27.0* 27.5*  MCV 92.4 94.1 91.8 93.2  PLT 224 229 239 259    Medications:    .  stroke: mapping our early stages of recovery book   Does not apply Once  . amiodarone  200 mg Oral Daily  . ARIPiprazole  10 mg Oral BID  . atorvastatin  20 mg Oral Daily  . Chlorhexidine Gluconate Cloth  6 each Topical Q0600  . coumadin book   Does not apply Once  . darbepoetin (ARANESP) injection - DIALYSIS  100 mcg Intravenous Q Sat-HD  . feeding supplement  1 Container Oral TID BM  . feeding supplement (NEPRO CARB STEADY)  237 mL Oral BID BM  . feeding supplement (PRO-STAT SUGAR FREE 64)  30 mL Oral BID  . ferrous sulfate  325 mg Oral Daily  . insulin aspart  0-9 Units Subcutaneous Q4H  . mouth rinse  15 mL Mouth Rinse BID  . metoprolol tartrate  12.5 mg Oral BID  . multivitamin  1 tablet Oral QHS  . pantoprazole  40 mg Oral Daily  . sodium chloride flush  3 mL Intravenous Q12H  . thiamine  100 mg Oral Daily  . valproic acid  250 mg Oral Q6H  . Warfarin - Pharmacist Dosing Inpatient   Does not apply [id number]   Jaclyn Shiley, MD 09/20/2019, 10:13 AM

## 2019-09-20 NOTE — Progress Notes (Signed)
Physical Therapy Treatment Patient Details Name: Jaclyn Day MRN: [id number] DOB: [birth year] Today's Date: 09/20/2019    History of Present Illness 73yo female admitted after out of hospital cardiac arrest, unclear etiology CPR started by fire 10 min CPR with 1 Epi before ROSC. PMH includes a-fib, diastolic CHF, Mood disorder, CKD. CRRT initiated on 10/31 for volume removal. Pt transitioned to intermittent HD.     PT Comments    Pt asleep in bed upon arrival of PT, agreeable to PT session with focus on general LE strengthening and seated balance. The pt assisted in bed mobility (pt initiates when instructed, but is unable to complete movement or rolls without mod/maxA) and then was lifted to recliner where she performed exercises for general LE strengthening. The pt was agreeable throughout session but demos confusion and memory deficits reporting she is "scared of going to the chair" until she was reminded she was already sitting in the recliner to which she responded "oh that was fast" and stated she was no longer scared. The pt will continue to benefit from skilled PT to address independence with bed mobility and seated tolerance.     Follow Up Recommendations  SNF;Supervision/Assistance - 24 hour     Equipment Recommendations  None recommended by PT    Recommendations for Other Services       Precautions / Restrictions Precautions Precautions: Fall Precaution Comments: BUE and BLE weakness with digit extension limitations noted as well, increased swelling in L UE. Restrictions Weight Bearing Restrictions: No    Mobility  Bed Mobility Overal bed mobility: Needs Assistance Bed Mobility: Rolling Rolling: Max assist         General bed mobility comments: Heavy max A for all bed mobility. Pt is initiating and following directions better with R to L than to R  Transfers Overall transfer level: Needs assistance Equipment used: Ambulation equipment used              General transfer comment: maxisky to recliner for seated exercise and to progress seated tolerance, pt continues to need assist with rolling to complete pericare and pad placement  Ambulation/Gait             General Gait Details: unable   Stairs             Wheelchair Mobility    Modified Rankin (Stroke Patients Only)       Balance Overall balance assessment: Needs assistance Sitting-balance support: Feet supported;Bilateral upper extremity supported Sitting balance-Leahy Scale: Poor Sitting balance - Comments: Intermittently able to maintain sitting balance, but overal needing mod A Postural control: Posterior lean;Right lateral lean     Standing balance comment: unable                            Cognition Arousal/Alertness: Awake/alert Behavior During Therapy: Anxious;Flat affect Overall Cognitive Status: Impaired/Different from baseline Area of Impairment: Attention;Following commands;Safety/judgement;Problem solving                 Orientation Level: Disoriented to;Situation;Time;Place Current Attention Level: Focused Memory: Decreased short-term memory Following Commands: Follows one step commands inconsistently;Follows one step commands with increased time Safety/Judgement: Decreased awareness of deficits Awareness: Intellectual Problem Solving: Slow processing;Decreased initiation;Difficulty sequencing;Requires verbal cues;Requires tactile cues General Comments: slow to process commands, overall pleasant and holding appropriate conversation with OT      Exercises General Exercises - Lower Extremity Long Arc Quad: Both;10 reps;Seated;AROM(sig limitations in ROM) Hip Flexion/Marching: AROM;Both;Seated;10 reps  Toe Raises: AROM;Both;Seated;10 reps Heel Raises: AROM;Both;Seated;10 reps    General Comments        Pertinent Vitals/Pain Pain Assessment: No/denies pain Pain Descriptors / Indicators: Grimacing;Guarding Pain  Intervention(s): Limited activity within patient's tolerance;Monitored during session    Home Living                      Prior Function            PT Goals (current goals can now be found in the care plan section) Acute Rehab PT Goals Patient Stated Goal: to get stronger PT Goal Formulation: With patient Time For Goal Achievement: 10/02/19 Potential to Achieve Goals: Fair Progress towards PT goals: Progressing toward goals    Frequency    Min 2X/week      PT Plan Current plan remains appropriate    Co-evaluation              AM-PAC PT "6 Clicks" Mobility   Outcome Measure  Help needed turning from your back to your side while in a flat bed without using bedrails?: A Lot Help needed moving from lying on your back to sitting on the side of a flat bed without using bedrails?: A Lot Help needed moving to and from a bed to a chair (including a wheelchair)?: Total Help needed standing up from a chair using your arms (e.g., wheelchair or bedside chair)?: Total Help needed to walk in hospital room?: Total Help needed climbing 3-5 steps with a railing? : Total 6 Click Score: 8    End of Session Equipment Utilized During Treatment: Hannah Beat) Activity Tolerance: Patient tolerated treatment well Patient left: with call bell/phone within reach;in chair;with chair alarm set Nurse Communication: Mobility status;Need for lift equipment PT Visit Diagnosis: Muscle weakness (generalized) (M62.81)     Time: [id number] PT Time Calculation (min) (ACUTE ONLY): 24 min  Charges:  $Therapeutic Exercise: 8-22 mins $Therapeutic Activity: 8-22 mins                     Mickey Farber, PT, DPT   Acute Rehabilitation Department [phone]   Otho Bellows 09/20/2019, 4:05 PM

## 2019-09-20 NOTE — Progress Notes (Signed)
PROGRESS NOTE  Jaclyn Day [id number] DOB: 1946/09/23   PCP: Cyndi Bender, PA-C  Patient is from: Home  DOA: 08/03/2019 LOS: 58  Brief Narrative / Interim history: 73 year old female with history of congenital unilateral kidney, CKD-4, HTN, persistent A. fib on Eliquis and diastolic CHF, depression/anxiety/schizoaffective disorder and developmental disorder admitted to ICU 08/03/2023 cardiac arrest.  Found to have new systolic CHF with EF of 20 to 25%, global hypokinesis.  Blood culture grew MSSA for which she was started on vancomycin and transition to Ancef on 08/06/2019 which was completed on 09/18/2019 per ID recommendations. Patient had echocardiogram on 08/21/2019 that showed EF of 55 to 60%, moderate LAE and RVSP to 39. Hospital course complicated by A. fib with RVR, acute metabolic encephalopathy/agitation and AKI.    Assessment & Plan: AKI/CKD-4: due to shock from cardiac issues and bacteremia.  Makes fair amount of urine. -Right IJ TDC on 11/11.  Turned down for permanent access due to debility -CRRT 10/30-11/1>>IHD TTS -Monitor urine output -Nephrology managing.  Metabolic acidosis/bone mineral disorder/hyperkalemia: due to renal failure. -Nephrology managing  Acute respiratory failure with hypoxia "multifactorial including cardiac arrest, pulmonary edema, bilateral pleural effusion and anxiety.  Resolved.  She is now on room air. -treat treatable causes.  MSSA bacteremia/AV endocarditis: Leukocytosis and bandemia resolved.  Completed course of Ancef for 6 weeks with last dose being on 09/18/2019.  Leukocytosis/bandemia: Resolved.  She remains afebrile.  Monitor CBC every 48 hours.  V. tach/cardiac arrest/acute systolic CHF: Initial TTE on 10/23 with EF of 20 to 25%.  Repeat TTE on 11/9 with EF of 55 to 60%.  Currently euvolemic except for stable trace LUE edema. -S/p ETT and hypothermia protocol in ICU -Fluid management by HD.  Acute metabolic encephalopathy:  Resolved.  Oriented fairly but limited insight -Frequent reorientation and delirium precautions.  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  Patient with significant upper and lower extremity weakness left greater than right mainly in upper extremities. -Continue warfarin per pharmacy .  INR therapeutic.  Persistent A. fib: Rate is much better controlled.  Continue metoprolol 12.5 mg twice daily, amiodarone and Coumadin.  INR therapeutic.   Anemia: Multifactorial-acute blood loss from HD cath 11/15, chronic disease and malnutrition. -Hgb 11.6 (admit & b/l)>> 6.9 (11/10)>1u> 8.2>> 5.9 (11/16)> 8.8>> 7.6>1u>8.8>> 8.4> 7.8> 8.3> 8.4 -On Aranesp and iron per nephro -Optimize nutrition -Monitor H&H  Hypotension: Slightly hypotensive during hemodialysis but not symptomatic. -Off midodrine.   -Dysphagia: SLP and dietitian following.   -Appreciate SLP input-continue dysphagia 1 diet -Cortrak removed 12/4. -Needs assistance with feeding.  Oral thrush: Resolved.   Vitamin D deficiency -On weekly vitamin D 50,000 units  Stage I sacral decubitus ulcer: Not present on admission -Dressing and offloading per RN  Anxiety/depression/schizoaffective disorder/developmental disorder -Continue Depakote and Abilify -Cymbalta has been on hold.  Moderate malnutrition: Nutrition Problem: Moderate Malnutrition Etiology: acute illness(acute respiratory failure, AKI required CRRT, endocarditis)  Signs/Symptoms: mild fat depletion, mild muscle depletion, moderate muscle depletion  Interventions: Supplements and assistance with feeding.   DVT prophylaxis: Subcu heparin Code Status: Full code Family Communication: No family present.  Patient alert and oriented.  Called patient's significant other [name] Stutts twice today and left voicemail.  No call returned yet.  Disposition Plan: Remains inpatient.  Final disposition SNF.  Did not  qualify for LTAC.  I personally had a very long discussion with patient significant other time he was present in the room.  I  explained to him that patient is ready medically to be discharged to skilled nursing facility.  He does not want her to go to any skilled nursing facility which will not allow him to visit her.  He wants her to go to [facility].  I did explain to him that patient did not qualify for LTAC and currently due to pandemic, most of the nursing homes are not allowing visitation.  There is one facility which is willing to take her and allows visitation but he will not accept that since that is 40 minutes drive to him.  Discussed with him having case manager on the speaker phone. Consultants: Cardiology (signed off), neurology (signed off), IR (signed off), palliative, nephrology  Subjective: Patient seen and examined.  Complained of some cough which started last night sometime.  No shortness of breath, chest pain or any other complaint.  Objective: Vitals:   09/19/19 2100 09/19/19 2209 09/20/19 0100 09/20/19 0500  BP: 114/67  121/66 110/71  Pulse: 86 93 73 90  Resp: (!) 29  (!) 25 16  Temp: 97.9 F (36.6 C)  98.1 F (36.7 C) 98.2 F (36.8 C)  TempSrc: Oral  Oral Oral  SpO2: 99%  99% 96%  Weight:   73.6 kg   Height:        Intake/Output Summary (Last 24 hours) at 09/20/2019 0742 Last data filed at 09/19/2019 1848 Gross per 24 hour  Intake 50 ml  Output 1973 ml  Net -1923 ml   Filed Weights   09/19/19 0645 09/19/19 0750 09/20/19 0100  Weight: 75.1 kg 74.7 kg 73.6 kg    Examination:  General exam: Appears calm and comfortable  Respiratory system: Clear to auscultation. Respiratory effort normal. Cardiovascular system: S1 & S2 heard, irregularly irregular rate and rhythm. No JVD, murmurs, rubs, gallops or clicks. No pedal edema. Gastrointestinal system: Abdomen is nondistended, soft and nontender. No organomegaly or masses felt. Normal bowel sounds heard. Central  nervous system: Alert and oriented. No focal neurological deficits. Extremities: Symmetric 5 x 5 power. Skin: No rashes, lesions or ulcers.  Psychiatry: Judgement and insight appear poor. Mood & affect flat.  Procedures:  HD cath placement CRRT IHD  Microbiology summarized: 10/22-COVID-19 negative 10/22-MRSA PCR negative 10/22-respiratory viral panel negative 10/22-urine culture with E. Coli 10/22-blood culture with MSSA in 1/2 bottles 10/26, 10/29, 11/16 and 11/20-blood cultures negative  Sch Meds:  Scheduled Meds: .  stroke: mapping our early stages of recovery book   Does not apply Once  . amiodarone  200 mg Oral Daily  . ARIPiprazole  10 mg Oral BID  . atorvastatin  20 mg Oral Daily  . Chlorhexidine Gluconate Cloth  6 each Topical Q0600  . coumadin book   Does not apply Once  . darbepoetin (ARANESP) injection - DIALYSIS  100 mcg Intravenous Q Sat-HD  . feeding supplement  1 Container Oral TID BM  . feeding supplement (NEPRO CARB STEADY)  237 mL Oral BID BM  . feeding supplement (PRO-STAT SUGAR FREE 64)  30 mL Oral BID  . ferrous sulfate  325 mg Oral Daily  . insulin aspart  0-9 Units Subcutaneous Q4H  . mouth rinse  15 mL Mouth Rinse BID  . metoprolol tartrate  12.5 mg Oral BID  . multivitamin  1 tablet Oral QHS  . pantoprazole  40 mg Oral Daily  . sodium chloride flush  3 mL Intravenous Q12H  . thiamine  100 mg Oral Daily  . valproic acid  250 mg  Oral Q6H  . Warfarin - Pharmacist Dosing Inpatient   Does not apply q1800   Continuous Infusions:  PRN Meds:.acetaminophen, heparin, HYDROmorphone (DILAUDID) injection, levalbuterol, lidocaine (PF), lidocaine-prilocaine, pentafluoroprop-tetrafluoroeth, sodium chloride flush  Antimicrobials: Anti-infectives (From admission, onward)   Start     Dose/Rate Route Frequency Ordered Stop   09/09/19 1800  ceFAZolin (ANCEF) IVPB 2g/100 mL premix  Status:  Discontinued     2 g 200 mL/hr over 30 Minutes Intravenous Every T-Th-Sa  (1800) 09/05/19 1024 09/18/19 0932   09/04/19 1800  ceFAZolin (ANCEF) IVPB 2g/100 mL premix     2 g 200 mL/hr over 30 Minutes Intravenous Every M-W-F (1800) 09/04/19 1335 09/06/19 1736   08/31/19 1800  ceFAZolin (ANCEF) IVPB 2g/100 mL premix  Status:  Discontinued     2 g 200 mL/hr over 30 Minutes Intravenous Every T-Th-Sa (1800) 08/31/19 1401 09/04/19 1335   08/30/19 0900  ceFAZolin (ANCEF) IVPB 2g/100 mL premix     2 g 200 mL/hr over 30 Minutes Intravenous  Once 08/30/19 0854 08/30/19 1134   08/24/19 1200  ceFAZolin (ANCEF) IVPB 2g/100 mL premix  Status:  Discontinued     2 g 200 mL/hr over 30 Minutes Intravenous Every T-Th-Sa (Hemodialysis) 08/23/19 0807 08/31/19 1401   08/20/19 2200  ceFAZolin (ANCEF) IVPB 1 g/50 mL premix  Status:  Discontinued     1 g 100 mL/hr over 30 Minutes Intravenous Daily at bedtime 08/20/19 1507 08/23/19 0807   08/15/19 2200  ceFAZolin (ANCEF) IVPB 2g/100 mL premix  Status:  Discontinued     2 g 200 mL/hr over 30 Minutes Intravenous Daily at bedtime 08/15/19 1133 08/20/19 1507   08/11/19 1800  ceFAZolin (ANCEF) IVPB 2g/100 mL premix  Status:  Discontinued     2 g 200 mL/hr over 30 Minutes Intravenous Every 12 hours 08/11/19 1110 08/15/19 1133   08/10/19 1600  ceFAZolin (ANCEF) IVPB 1 g/50 mL premix  Status:  Discontinued     1 g 100 mL/hr over 30 Minutes Intravenous Every 12 hours 08/10/19 1530 08/11/19 1110   08/06/19 1800  ceFAZolin (ANCEF) IVPB 1 g/50 mL premix  Status:  Discontinued     1 g 100 mL/hr over 30 Minutes Intravenous Every 24 hours 08/06/19 1025 08/10/19 1530   08/05/19 1530  vancomycin (VANCOCIN) 1,750 mg in sodium chloride 0.9 % 500 mL IVPB     1,750 mg 250 mL/hr over 120 Minutes Intravenous  Once 08/05/19 1444 08/05/19 1736   08/05/19 1443  vancomycin variable dose per unstable renal function (pharmacist dosing)  Status:  Discontinued      Does not apply See admin instructions 08/05/19 1444 08/06/19 1025   08/03/19 1300  cefTRIAXone  (ROCEPHIN) 2 g in sodium chloride 0.9 % 100 mL IVPB  Status:  Discontinued     2 g 200 mL/hr over 30 Minutes Intravenous Every 24 hours 08/03/19 1251 08/06/19 1025       I have personally reviewed the following labs and images: CBC: Recent Labs  Lab 09/15/19 0438 09/16/19 0728 09/17/19 0431 09/19/19 0355  WBC 13.3* 9.5 10.0 10.6*  HGB 8.4* 7.8* 8.3* 8.4*  HCT 26.7* 25.6* 27.0* 27.5*  MCV 92.4 94.1 91.8 93.2  PLT 224 229 239 259   BMP &GFR Recent Labs  Lab 09/15/19 1647 09/16/19 0728 09/17/19 0431 09/18/19 0336 09/19/19 0355 09/19/19 0808 09/20/19 0249  NA 136 139 138 138  --  138  --   K 4.4 4.3 4.1 4.5  --  4.6  --  CL 99 99 97* 99  --  98  --   CO2 25 26 27 25   --  24  --   GLUCOSE 102* 96 106* 67*  --  112*  --   BUN 68* 78* 40* 65*  --  88*  --   CREATININE 3.69* 3.93* 2.42* 3.28*  --  4.32*  --   CALCIUM 9.2 9.2 9.2 9.6  --  9.3  --   MG  --   --  1.8 2.1 1.9  --  1.9  PHOS 5.9* 6.7* 4.6 6.3*  --  6.0*  --    Estimated Creatinine Clearance: 12.1 mL/min (A) (by C-G formula based on SCr of 4.32 mg/dL (H)). Liver & Pancreas: Recent Labs  Lab 09/15/19 1647 09/16/19 0728 09/17/19 0431 09/18/19 0336 09/19/19 0808  ALBUMIN 1.8* 1.7* 1.8* 1.9* 1.7*   No results for input(s): LIPASE, AMYLASE in the last 168 hours. No results for input(s): AMMONIA in the last 168 hours. Diabetic: No results for input(s): HGBA1C in the last 72 hours. Recent Labs  Lab 09/19/19 0733 09/19/19 1254 09/19/19 1608 09/20/19 0125 09/20/19 0439  GLUCAP 107* 83 96 86 79   Cardiac Enzymes: No results for input(s): CKTOTAL, CKMB, CKMBINDEX, TROPONINI in the last 168 hours. No results for input(s): PROBNP in the last 8760 hours. Coagulation Profile: Recent Labs  Lab 09/16/19 0347 09/17/19 0431 09/18/19 0336 09/19/19 0355 09/20/19 0249  INR 2.3* 1.9* 2.1* 2.4* 2.3*   Thyroid Function Tests: No results for input(s): TSH, T4TOTAL, FREET4, T3FREE, THYROIDAB in the last 72  hours. Lipid Profile: No results for input(s): CHOL, HDL, LDLCALC, TRIG, CHOLHDL, LDLDIRECT in the last 72 hours. Anemia Panel: No results for input(s): VITAMINB12, FOLATE, FERRITIN, TIBC, IRON, RETICCTPCT in the last 72 hours. Urine analysis:    Component Value Date/Time   COLORURINE BROWN (A) 08/05/2019 2018   APPEARANCEUR TURBID (A) 08/05/2019 2018   LABSPEC RESULTS UNAVAILABLE DUE TO INTERFERING SUBSTANCE 08/05/2019 2018   PHURINE RESULTS UNAVAILABLE DUE TO INTERFERING SUBSTANCE 08/05/2019 2018   GLUCOSEU RESULTS UNAVAILABLE DUE TO INTERFERING SUBSTANCE (A) 08/05/2019 2018   HGBUR RESULTS UNAVAILABLE DUE TO INTERFERING SUBSTANCE (A) 08/05/2019 2018   BILIRUBINUR RESULTS UNAVAILABLE DUE TO INTERFERING SUBSTANCE (A) 08/05/2019 2018   KETONESUR RESULTS UNAVAILABLE DUE TO INTERFERING SUBSTANCE (A) 08/05/2019 2018   PROTEINUR RESULTS UNAVAILABLE DUE TO INTERFERING SUBSTANCE (A) 08/05/2019 2018   UROBILINOGEN 0.2 05/07/2009 1520   NITRITE RESULTS UNAVAILABLE DUE TO INTERFERING SUBSTANCE (A) 08/05/2019 2018   LEUKOCYTESUR RESULTS UNAVAILABLE DUE TO INTERFERING SUBSTANCE (A) 08/05/2019 2018   Sepsis Labs: Invalid input(s): PROCALCITONIN, [place]  Microbiology: Recent Results (from the past 240 hour(s))  MRSA PCR Screening     Status: None   Collection Time: 09/12/19  3:42 PM   Specimen: Nasopharyngeal  Result Value Ref Range Status   MRSA by PCR NEGATIVE NEGATIVE Final    Comment:        The GeneXpert MRSA Assay (FDA approved for NASAL specimens only), is one component of a comprehensive MRSA colonization surveillance program. It is not intended to diagnose MRSA infection nor to guide or monitor treatment for MRSA infections. Performed at [facility], [place] [address], [place], [place] [address]     Radiology Studies: No results found.  Total time spent 32 minutes which included coordination of care, chart review, documentation and communication with the  family/significant other Darliss Cheney, MD [facility]  If 7PM-7AM, please contact night-coverage [email or web address] Password [facility] 09/20/2019, 7:42  AM  

## 2019-09-20 NOTE — Progress Notes (Signed)
Occupational Therapy Treatment Patient Details Name: Jaclyn Day MRN: [id number] DOB: [birth year] Today's Date: 09/20/2019    History of present illness 73yo female admitted after out of hospital cardiac arrest, unclear etiology CPR started by fire 10 min CPR with 1 Epi before ROSC. PMH includes a-fib, diastolic CHF, Mood disorder, CKD. CRRT initiated on 10/31 for volume removal. Pt transitioned to intermittent HD.    OT comments  Session focused on EOB sitting balance and morning ADLs, including bathing, dressing, and eating. Pt required fluctuating mod A- CGA for EOB sitting balance. Anxiety still evident with occasional HR increased to 120's. Mod A overall for bathing. Pt required mod A for self-feeding, including cueing for alternating liquids via cup to increase oral clearing. Oral care performed following with minimal oral holding. SNF remains appropriate d/c.    Follow Up Recommendations  SNF;Supervision/Assistance - 24 hour    Equipment Recommendations  None recommended by OT       Precautions / Restrictions Precautions Precautions: Fall Precaution Comments: BUE and BLE weakness with digit extension limitations noted as well, increased swelling in L UE. Restrictions Weight Bearing Restrictions: No       Mobility Bed Mobility Overal bed mobility: Needs Assistance Bed Mobility: Rolling;Sidelying to Sit Rolling: Max assist Sidelying to sit: Max assist       General bed mobility comments: Heavy max A for all bed mobility. Pt is initiating and following directions  Transfers                      Balance Overall balance assessment: Needs assistance Sitting-balance support: Feet supported;Bilateral upper extremity supported Sitting balance-Leahy Scale: Poor Sitting balance - Comments: Intermittently able to maintain sitting balance, but overal needing mod A Postural control: Posterior lean                                 ADL either performed  or assessed with clinical judgement   ADL Overall ADL's : Needs assistance/impaired Eating/Feeding: Bed level;Set up Eating/Feeding Details (indicate cue type and reason): Properly positioned and then was able to feed self with mod A for spearing food Grooming: Wash/dry face;Minimal assistance;Sitting   Upper Body Bathing: Moderate assistance;Sitting;Cueing for sequencing Upper Body Bathing Details (indicate cue type and reason): Assistance required for thoroughness Lower Body Bathing: Moderate assistance;Bed level Lower Body Bathing Details (indicate cue type and reason): Able to reach anterior peri area, assistance required for legs and posterior Upper Body Dressing : Moderate assistance;Sitting Upper Body Dressing Details (indicate cue type and reason): gown                   General ADL Comments: Session focused on EOB sitting balance and ADLs               Cognition Arousal/Alertness: Awake/alert Behavior During Therapy: Anxious;Flat affect Overall Cognitive Status: Impaired/Different from baseline Area of Impairment: Attention;Following commands;Safety/judgement;Problem solving                 Orientation Level: Disoriented to;Situation;Time;Place Current Attention Level: Focused Memory: Decreased short-term memory Following Commands: Follows one step commands inconsistently;Follows one step commands with increased time Safety/Judgement: Decreased awareness of deficits   Problem Solving: Slow processing;Decreased initiation;Difficulty sequencing;Requires verbal cues;Requires tactile cues General Comments: slow to process commands, overall pleasant and holding appropriate conversation with OT              General Comments Occasional increase  in HR to 120's when sitting EOB, accompanied by SOB and pt agreeing to being anxious. Cueing for breathing techinques reduced this and otherwise all VSS    Pertinent Vitals/ Pain       Pain Assessment: No/denies  pain   Progress Toward Goals  OT Goals(current goals can now be found in the care plan section)  Progress towards OT goals: Progressing toward goals  Acute Rehab OT Goals Patient Stated Goal: to get stronger OT Goal Formulation: With patient Time For Goal Achievement: 09/25/19 Potential to Achieve Goals: [place] Discharge plan remains appropriate       AM-PAC OT "6 Clicks" Daily Activity     Outcome Measure   Help from another person eating meals?: A Lot Help from another person taking care of personal grooming?: A Lot Help from another person toileting, which includes using toliet, bedpan, or urinal?: A Lot Help from another person bathing (including washing, rinsing, drying)?: A Lot Help from another person to put on and taking off regular upper body clothing?: A Lot Help from another person to put on and taking off regular lower body clothing?: A Lot 6 Click Score: 12    End of Session    OT Visit Diagnosis: Muscle weakness (generalized) (M62.81);Feeding difficulties (R63.3);Unsteadiness on feet (R26.81);Pain   Activity Tolerance Patient tolerated treatment well   Patient Left in bed;with call bell/phone within reach;with family/visitor present;with bed alarm set   Nurse Communication Mobility status        Time: 0803-0900 OT Time Calculation (min): 57 min  Charges: OT General Charges $OT Visit: 1 Visit OT Treatments $Self Care/Home Management : 53-67 mins   Curtis Sites OTR/L  09/20/2019, 9:01 AM

## 2019-09-20 NOTE — Progress Notes (Signed)
  Speech Language Pathology Treatment: Dysphagia;Cognitive-Linquistic  Patient Details Name: Jaclyn Day MRN: [id number] DOB: 25-May-1946 Today's Date: 09/20/2019 Time: 1450-1520 SLP Time Calculation (min) (ACUTE ONLY): 30 min  Assessment / Plan / Recommendation Clinical Impression  Jaclyn Day was seen for skilled ST intervention targeting goals for swallow safety and cognitive-linguistic function. Jaclyn Day was awake and alert, pleasant and cooperative with unfamiliar therapist.   DYSPHAGIA: Jaclyn Day accepted trials of puree, graham cracker, and thin liquids. Slight right anterior leakage noted, and minimal oral residue. Liquid wash effectively cleared residue from oral cavity. Recommend advance diet to dys 2 with thin liquids, continue crushed meds. Safe swallow precautions updated in Jaclyn Day room. SLP will follow to assess tolerance of advanced solids.  COGNITIVE-LINGUISTICS: The Mini-Mental State Examination (MMSE) was administered. Jaclyn Day scored 19/30, indicating moderate cognitive impairment. Points were lost on orientation (not accurate for year), attention, delayed recall, following complex directions, writing, and figure copying. Jaclyn Day has met goals for intelligibility and communication of wants and needs. Based on MMSE results today, goals have been updated. SLP will continue to follow acutely per plan of care.   HPI HPI: Jaclyn Day is a 73 yo F admitted for witnessed out of hospital cardiac arrest, unclear etiology, with field ROSC, resultant respiratory failure and encephalopathy s/p TTM; ETT 10/22-11/1. CRRT initiated 10/30. PMH: unilateral congenital absence of kidney, Afib, HTN, CKD, CHF, anemia, mood disorder.  MRI on 11/7 revealed "Several scattered small acute infarcts in the white matter of the superior frontal lobes, left occipital lobe. No associated hemorrhage or mass effect. 2. Several small subacute or perhaps chronic infarcts in the bilateral cerebellum."      SLP Plan  Continue with current plan of  care;Goals updated       Recommendations  Diet recommendations: Dysphagia 2 (fine chop);Thin liquid Liquids provided via: Cup Medication Administration: Crushed with puree Supervision: Staff to assist with self feeding;Full supervision/cueing for compensatory strategies Compensations: Minimize environmental distractions;Slow rate;Small sips/bites Postural Changes and/or Swallow Maneuvers: Seated upright 90 degrees;Upright 30-60 min after meal                Oral Care Recommendations: Oral care BID Follow up Recommendations: Skilled Nursing facility;24 hour supervision/assistance SLP Visit Diagnosis: Dysphagia, oropharyngeal phase (R13.12) Plan: Continue with current plan of care;Goals updated       [place], [place], [place] Pathologist Office: [phone] Pager: [phone]  Shonna Chock 09/20/2019, 3:25 PM

## 2019-09-20 NOTE — Progress Notes (Signed)
ANTICOAGULATION CONSULT NOTE - Follow-Up Consult  Pharmacy Consult for Warfarin Indication: atrial fibrillation  Allergies  Allergen Reactions  . Codeine Other (See Comments)    Reaction not recalled by family- was told to never take this    Patient Measurements: Height: 5\' 6"  (167.6 cm) Weight: 162 lb 4.1 oz (73.6 kg) IBW/kg (Calculated) : 59.3  Vital Signs: Temp: 98.5 F (36.9 C) (12/09 1150) Temp Source: Oral (12/09 1150) BP: 101/64 (12/09 1150) Pulse Rate: 93 (12/09 1150)  Labs: Recent Labs    09/18/19 0336 09/19/19 0355 09/19/19 0808 09/20/19 0249  HGB  --  8.4*  --   --   HCT  --  27.5*  --   --   PLT  --  259  --   --   LABPROT 23.1* 26.4*  --  25.4*  INR 2.1* 2.4*  --  2.3*  CREATININE 3.28*  --  4.32*  --     Estimated Creatinine Clearance: 12.1 mL/min (A) (by C-G formula based on SCr of 4.32 mg/dL (H)).   Medical History: Past Medical History:  Diagnosis Date  . Anemia   . Chronic diastolic CHF (congestive heart failure) ([place])   . CKD (chronic kidney disease), stage IV ([place])   . Hypertension   . Persistent atrial fibrillation ([place])    a. dx 01/2019 s/p TEE DCCV.  Marland Kitchen Renal disorder   . Unilateral congenital absence of kidney    Assessment: Pt is a 73 yr old female admitted s/p out of hospital cardiac arrest and TTM. Admit c/b by encephalopathy, AKI which progressed to HD, and ?embolic CVA. Pt on apixaban PTA for Afib, which was held for IV heparin. Heparin subsequently held on 11/16 due to bleeding from HD lines and low Hgb. Warfarin started 11/18 with no bridge planned. Patient on amiodarone PO   INR today is therapeutic at 2.3. CBC stable on last check. No s/sx of bleeding. Oral intake documented ~50%.   Goal of Therapy:  INR 2-3 Monitor platelets by anticoagulation protocol: Yes   Plan:  Warfarin 3 mg po x 1. Daily INR.  Antonietta Jewel, PharmD, BCCCP Clinical Pharmacist  Phone: [phone]  Please check AMION for all [place] phone  numbers After 10:00 PM, call [place] [phone]  09/20/2019 1:25 PM

## 2019-09-21 LAB — RENAL FUNCTION PANEL
Albumin: 1.9 g/dL — ABNORMAL LOW (ref 3.5–5.0)
Anion gap: 12 (ref 5–15)
BUN: 46 mg/dL — ABNORMAL HIGH (ref 8–23)
CO2: 26 mmol/L (ref 22–32)
Calcium: 9.3 mg/dL (ref 8.9–10.3)
Chloride: 99 mmol/L (ref 98–111)
Creatinine, Ser: 3.79 mg/dL — ABNORMAL HIGH (ref 0.44–1.00)
GFR calc Af Amer: 13 mL/min — ABNORMAL LOW (ref >60)
GFR calc non Af Amer: 11 mL/min — ABNORMAL LOW (ref >60)
Glucose, Bld: 82 mg/dL (ref 70–99)
Phosphorus: 6.3 mg/dL — ABNORMAL HIGH (ref 2.5–4.6)
Potassium: 4.5 mmol/L (ref 3.5–5.1)
Sodium: 137 mmol/L (ref 135–145)

## 2019-09-21 LAB — CBC
HCT: 26.2 % — ABNORMAL LOW (ref 36.0–46.0)
Hemoglobin: 7.9 g/dL — ABNORMAL LOW (ref 12.0–15.0)
MCH: 28.6 pg (ref 26.0–34.0)
MCHC: 30.2 g/dL (ref 30.0–36.0)
MCV: 94.9 fL (ref 80.0–100.0)
Platelets: 293 10*3/uL (ref 150–400)
RBC: 2.76 MIL/uL — ABNORMAL LOW (ref 3.87–5.11)
RDW: 18 % — ABNORMAL HIGH (ref 11.5–15.5)
WBC: 9.2 10*3/uL (ref 4.0–10.5)
nRBC: 0 % (ref 0.0–0.2)

## 2019-09-21 LAB — PROTIME-INR
INR: 2.6 — ABNORMAL HIGH (ref 0.8–1.2)
Prothrombin Time: 27.5 seconds — ABNORMAL HIGH (ref 11.4–15.2)

## 2019-09-21 LAB — GLUCOSE, CAPILLARY
Glucose-Capillary: 107 mg/dL — ABNORMAL HIGH (ref 70–99)
Glucose-Capillary: 85 mg/dL (ref 70–99)
Glucose-Capillary: 88 mg/dL (ref 70–99)
Glucose-Capillary: 92 mg/dL (ref 70–99)

## 2019-09-21 LAB — MAGNESIUM: Magnesium: 2.1 mg/dL (ref 1.7–2.4)

## 2019-09-21 LAB — SARS CORONAVIRUS 2 (TAT 6-24 HRS): SARS Coronavirus 2: NEGATIVE

## 2019-09-21 MED ORDER — ALBUMIN HUMAN 25 % IV SOLN
INTRAVENOUS | Status: AC
  Administered 2019-09-21: 25 g via INTRAVENOUS
  Filled 2019-09-21: qty 100

## 2019-09-21 MED ORDER — HEPARIN SODIUM (PORCINE) 1000 UNIT/ML DIALYSIS: 40.0000 [IU]/kg | INTRAMUSCULAR | Status: DC | PRN

## 2019-09-21 MED ORDER — FERROUS SULFATE 325 (65 FE) MG PO TABS: 325.0000 mg | ORAL_TABLET | Freq: Every day | ORAL | 0 refills | Status: DC

## 2019-09-21 MED ORDER — VALPROIC ACID 250 MG PO CAPS: 250.0000 mg | ORAL_CAPSULE | Freq: Four times a day (QID) | ORAL | 0 refills | Status: DC

## 2019-09-21 MED ORDER — AMIODARONE HCL 200 MG PO TABS: 200.0000 mg | ORAL_TABLET | Freq: Every day | ORAL | 0 refills | Status: DC

## 2019-09-21 MED ORDER — WARFARIN SODIUM 3 MG PO TABS: 3.0000 mg | ORAL_TABLET | Freq: Every day | ORAL | 0 refills | Status: DC

## 2019-09-21 MED ORDER — HEPARIN SODIUM (PORCINE) 1000 UNIT/ML IJ SOLN
INTRAMUSCULAR | Status: AC
  Administered 2019-09-21: 3200 [IU] via INTRAVENOUS_CENTRAL
  Filled 2019-09-21: qty 4

## 2019-09-21 MED ORDER — ALBUMIN HUMAN 25 % IV SOLN: 25.0000 g | Freq: Once | INTRAVENOUS | Status: AC

## 2019-09-21 MED ORDER — METOPROLOL TARTRATE 25 MG PO TABS: 12.5000 mg | ORAL_TABLET | Freq: Two times a day (BID) | ORAL | 0 refills | Status: DC

## 2019-09-21 MED ORDER — WARFARIN SODIUM 2 MG PO TABS
3.0000 mg | ORAL_TABLET | Freq: Once | ORAL | Status: AC
  Administered 2019-09-21: 17:00:00 3 mg via ORAL
  Filled 2019-09-21 (×2): qty 1

## 2019-09-21 NOTE — Discharge Summary (Signed)
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    Liver Function Tests: Recent Labs  Lab 09/15/19 1647 09/16/19 0728 09/17/19 0431 09/18/19 0336 09/19/19 0808  ALBUMIN 1.8* 1.7* 1.8* 1.9* 1.7*   No results for input(s): LIPASE, AMYLASE in the last 168 hours. No results for input(s): AMMONIA in the last 168 hours. CBC: Recent Labs  Lab 09/15/19 0438 09/16/19 0728 09/17/19 0431 09/19/19 0355  WBC 13.3* 9.5 10.0 10.6*  HGB 8.4* 7.8* 8.3* 8.4*  HCT 26.7* 25.6* 27.0* 27.5*  MCV 92.4 94.1 91.8 93.2  PLT 224 229 239 259   Cardiac Enzymes: No results for input(s): CKTOTAL, CKMB, CKMBINDEX, TROPONINI in the last 168 hours. BNP: Invalid input(s): POCBNP CBG: Recent Labs  Lab 09/20/19 1612 09/20/19 2036 09/21/19 0003 09/21/19 0520 09/21/19 0808  GLUCAP 87 135* 92 85 88   D-Dimer No results for input(s): DDIMER in the last 72 hours. Hgb A1c No results for input(s): HGBA1C in the last 72 hours. Lipid Profile No results for input(s): CHOL, HDL, LDLCALC, TRIG, CHOLHDL, LDLDIRECT in the last 72 hours. Thyroid function studies No  results for input(s): TSH, T4TOTAL, T3FREE, THYROIDAB in the last 72 hours.  Invalid input(s): FREET3 Anemia work up No results for input(s): VITAMINB12, FOLATE, FERRITIN, TIBC, IRON, RETICCTPCT in the last 72 hours. Urinalysis    Component Value Date/Time   COLORURINE BROWN (A) 08/05/2019 2018   APPEARANCEUR TURBID (A) 08/05/2019 2018  LABSPEC RESULTS UNAVAILABLE DUE TO INTERFERING SUBSTANCE 08/05/2019 2018   PHURINE RESULTS UNAVAILABLE DUE TO INTERFERING SUBSTANCE 08/05/2019 2018   GLUCOSEU RESULTS UNAVAILABLE DUE TO INTERFERING SUBSTANCE (A) 08/05/2019 2018   HGBUR RESULTS UNAVAILABLE DUE TO INTERFERING SUBSTANCE (A) 08/05/2019 2018   BILIRUBINUR RESULTS UNAVAILABLE DUE TO INTERFERING SUBSTANCE (A) 08/05/2019 2018   KETONESUR RESULTS UNAVAILABLE DUE TO INTERFERING SUBSTANCE (A) 08/05/2019 2018   PROTEINUR RESULTS UNAVAILABLE DUE TO INTERFERING SUBSTANCE (A) 08/05/2019 2018   UROBILINOGEN 0.2 05/07/2009 1520   NITRITE RESULTS UNAVAILABLE DUE TO INTERFERING SUBSTANCE (A) 08/05/2019 2018   LEUKOCYTESUR RESULTS UNAVAILABLE DUE TO INTERFERING SUBSTANCE (A) 08/05/2019 2018   Sepsis Labs Invalid input(s): PROCALCITONIN,  WBC,  LACTICIDVEN Microbiology Recent Results (from the past 240 hour(s))  MRSA PCR Screening     Status: None   Collection Time: 09/12/19  3:42 PM   Specimen: Nasopharyngeal  Result Value Ref Range Status   MRSA by PCR NEGATIVE NEGATIVE Final    Comment:        The GeneXpert MRSA Assay (FDA approved for NASAL specimens only), is one component of a comprehensive MRSA colonization surveillance program. It is not intended to diagnose MRSA infection nor to guide or monitor treatment for MRSA infections. Performed at [facility], [place] [address], [place], [place] [address]      Time coordinating discharge: Over 30 minutes  SIGNED:   Darliss Cheney, MD  [facility] 09/21/2019, 11:05 AM  If 7PM-7AM, please contact  night-coverage [email or web address] Password [email or web address]

## 2019-09-21 NOTE — Progress Notes (Signed)
Patient ID: Jaclyn Day, female   DOB: February 03, 1946, 73 y.o.   MRN: [id number]  [facility] Progress Note    Assessment/ Plan:   1. Acute kidney Injury on chronic kidney disease stage IV-now likely ESRD: Secondary to ATN following cardiac arrest and MSSA bacteremia.  Currently getting hemodialysis via right IJ TDC on a TTS schedule (permanent access precluded by bacteremia/clinical status when earlier seen by vascular surgery).  Continue to follow progress with PT/OT to evaluate ability to undertake outpatient hemodialysis in recliner along with transfers.   2.  MSSA bacteremia/sepsis: S/P intravenous Ancef, source suspected to be from cellulitis of the lower extremities. 3.  Status post cardiac arrest: With significant deconditioning during his hospitalization.  Anticipate continued PT/OT. 4.  History of paroxysmal atrial fibrillation: Rate controlled, remains on amiodarone with anticoagulation per cardiology. 5.  Protein calorie malnutrition/deconditioning following acute illness: Anticipate prolonged rehabilitation course and may need LTAC (reportedly declined for admission).  Subjective:   Hypotensive (asymptomatic) at start of dialysis earlier today.   Objective:   BP (!) 95/52   Pulse 89   Temp (!) 97.5 F (36.4 C) (Oral)   Resp (!) 22   Ht 5\' 6"  (1.676 m)   Wt 74.2 kg   SpO2 96%   BMI 26.40 kg/m   Intake/Output Summary (Last 24 hours) at 09/21/2019 1041 Last data filed at 09/20/2019 1920 Gross per 24 hour  Intake 360 ml  Output -  Net 360 ml   Weight change: -0.2 kg  Physical Exam: Gen: Resting comfortably in dialysis CVS: Pulse irregularly irregular- normal rate, S1 and S2 normal Resp: Anteriorly clear to auscultation without distinct rales/wheeze. RIJ TDC Abd: Soft, obese, nontender Ext: Trace-1+ dependent/lower extremity edema  Imaging: No results found.  Labs: BMET Recent Labs  Lab 09/15/19 1647 09/16/19 0728 09/17/19 0431 09/18/19 0336  09/19/19 0808  NA 136 139 138 138 138  K 4.4 4.3 4.1 4.5 4.6  CL 99 99 97* 99 98  CO2 25 26 27 25 24   GLUCOSE 102* 96 106* 67* 112*  BUN 68* 78* 40* 65* 88*  CREATININE 3.69* 3.93* 2.42* 3.28* 4.32*  CALCIUM 9.2 9.2 9.2 9.6 9.3  PHOS 5.9* 6.7* 4.6 6.3* 6.0*   CBC Recent Labs  Lab 09/15/19 0438 09/16/19 0728 09/17/19 0431 09/19/19 0355  WBC 13.3* 9.5 10.0 10.6*  HGB 8.4* 7.8* 8.3* 8.4*  HCT 26.7* 25.6* 27.0* 27.5*  MCV 92.4 94.1 91.8 93.2  PLT 224 229 239 259    Medications:    .  stroke: mapping our early stages of recovery book   Does not apply Once  . amiodarone  200 mg Oral Daily  . ARIPiprazole  10 mg Oral BID  . atorvastatin  20 mg Oral Daily  . Chlorhexidine Gluconate Cloth  6 each Topical Q0600  . coumadin book   Does not apply Once  . darbepoetin (ARANESP) injection - DIALYSIS  100 mcg Intravenous Q Sat-HD  . feeding supplement  1 Container Oral TID BM  . feeding supplement (NEPRO CARB STEADY)  237 mL Oral BID BM  . feeding supplement (PRO-STAT SUGAR FREE 64)  30 mL Oral BID  . ferrous sulfate  325 mg Oral Daily  . insulin aspart  0-9 Units Subcutaneous Q4H  . mouth rinse  15 mL Mouth Rinse BID  . metoprolol tartrate  12.5 mg Oral BID  . multivitamin  1 tablet Oral QHS  . pantoprazole  40 mg Oral Daily  . sodium  chloride flush  3 mL Intravenous Q12H  . thiamine  100 mg Oral Daily  . valproic acid  250 mg Oral Q6H  . Warfarin - Pharmacist Dosing Inpatient   Does not apply [id number]   Elmarie Shiley, MD 09/21/2019, 10:41 AM

## 2019-09-21 NOTE — Progress Notes (Signed)
Back to room from HD. S/O into room at bedside.

## 2019-09-21 NOTE — Procedures (Signed)
Patient seen on Hemodialysis. BP (!) 95/52   Pulse 89   Temp (!) 97.5 F (36.4 C) (Oral)   Resp (!) 22   Ht 5\' 6"  (1.676 m)   Wt 74.2 kg   SpO2 96%   BMI 26.40 kg/m   QB 400, UF goal 1.5L (goal adjusted for hypotension and patient given 25gm albumin) Tolerating treatment without complaints at this time.   Elmarie Shiley MD [facility]. Office # [phone] Pager # [phone] 10:44 AM

## 2019-09-21 NOTE — Plan of Care (Signed)
  Problem: Education: Goal: Knowledge of General Education information will improve Description: Including pain rating scale, medication(s)/side effects and non-pharmacologic comfort measures Outcome: Progressing   Problem: Clinical Measurements: Goal: Ability to maintain clinical measurements within normal limits will improve Outcome: Progressing Goal: Will remain free from infection Outcome: Progressing Goal: Diagnostic test results will improve Outcome: Progressing Goal: Respiratory complications will improve Outcome: Progressing Goal: Cardiovascular complication will be avoided Outcome: Progressing   Problem: Activity: Goal: Risk for activity intolerance will decrease Outcome: Progressing   Problem: Nutrition: Goal: Adequate nutrition will be maintained Outcome: Progressing   Problem: Elimination: Goal: Will not experience complications related to bowel motility Outcome: Progressing Goal: Will not experience complications related to urinary retention Outcome: Progressing   Problem: Safety: Goal: Ability to remain free from injury will improve Outcome: Progressing   Problem: Skin Integrity: Goal: Risk for impaired skin integrity will decrease Outcome: Progressing   Problem: Education: Goal: Knowledge of disease or condition will improve Outcome: Progressing Goal: Knowledge of patient specific risk factors addressed and post discharge goals established will improve Outcome: Progressing   Problem: Coping: Goal: Will verbalize positive feelings about self Outcome: Progressing Goal: Will identify appropriate support needs Outcome: Progressing   Problem: Health Behavior/Discharge Planning: Goal: Ability to manage health-related needs will improve Outcome: Progressing   Problem: Nutrition: Goal: Risk of aspiration will decrease Outcome: Progressing Goal: Dietary intake will improve Outcome: Progressing   Elesa Hacker, RN

## 2019-09-21 NOTE — TOC Progression Note (Signed)
Transition of Care [facility]) - Progression Note    Patient Details  Name: Jaclyn Day MRN: [id number] Date of Birth: [birth year]  Transition of Care [facility]) CM/SW [place], LCSW Phone Number: 09/21/2019, 3:54 PM  Clinical Narrative:   CSW spoke with [name] at bedside. Jaclyn Day has agreed to [place]. We have received insurance auth for pt to admit to SNF. Pt needs updated COVID. Pt also needs to be clipped. Dialysis Coordinator is working on that process. Hopeful that pt can d/c tomorrow to [facility]. Jaclyn Day is aware that is the goal.    Expected Discharge Plan: Long Term Acute Care (LTAC) Barriers to Discharge: Continued Medical Work up  Expected Discharge Plan and Services Expected Discharge Plan: [place] (LTAC) In-house Referral: NA Discharge Planning Services: CM Consult Post Acute Care Choice: [place] arrangements for the past 2 months: Single Family Home Expected Discharge Date: 09/21/19                                     Social Determinants of Health (SDOH) Interventions    Readmission Risk Interventions Readmission Risk Prevention Plan 08/08/2019  Transportation Screening Complete  PCP or Specialist Appt within 3-5 Days Complete  HRI or Home Care Consult Complete  Medication Review (RN Care Manager) Complete  Some recent data might be hidden

## 2019-09-21 NOTE — Progress Notes (Signed)
SLP Cancellation Note  Patient Details Name: Jaclyn Day MRN: [id number] DOB: April 06, 1946   Cancelled treatment:        Attempted to see pt for ongoing dysphagia and cognitive linguistic treatment.  Pt off floor for HD at time of attempt.  SLP will reattempt as schedule permits.   Celedonio Savage, MA, [place] Office: [phone] 09/21/2019, 11:05 AM

## 2019-09-21 NOTE — Progress Notes (Signed)
ANTICOAGULATION CONSULT NOTE - Follow-Up Consult  Pharmacy Consult for Warfarin Indication: atrial fibrillation  Allergies  Allergen Reactions  . Codeine Other (See Comments)    Reaction not recalled by family- was told to never take this    Patient Measurements: Height: 5\' 6"  (167.6 cm) Weight: 163 lb 9.3 oz (74.2 kg) IBW/kg (Calculated) : 59.3  Vital Signs: Temp: 97.5 F (36.4 C) (12/10 0905) Temp Source: Oral (12/10 0905) BP: 100/55 (12/10 1130) Pulse Rate: 79 (12/10 1130)  Labs: Recent Labs    09/19/19 0355 09/19/19 0808 09/20/19 0249 09/21/19 0444 09/21/19 1107  HGB 8.4*  --   --   --  7.9*  HCT 27.5*  --   --   --  26.2*  PLT 259  --   --   --  293  LABPROT 26.4*  --  25.4* 27.5*  --   INR 2.4*  --  2.3* 2.6*  --   CREATININE  --  4.32*  --   --  3.79*    Estimated Creatinine Clearance: 13.8 mL/min (A) (by C-G formula based on SCr of 3.79 mg/dL (H)).   Medical History: Past Medical History:  Diagnosis Date  . Anemia   . Chronic diastolic CHF (congestive heart failure) ([place])   . CKD (chronic kidney disease), stage IV ([place])   . Hypertension   . Persistent atrial fibrillation ([place])    a. dx 01/2019 s/p TEE DCCV.  Marland Kitchen Renal disorder   . Unilateral congenital absence of kidney    Assessment: Pt is a 73 yr old female admitted s/p out of hospital cardiac arrest and TTM. Admit c/b by encephalopathy, AKI which progressed to HD, and ?embolic CVA. Pt on apixaban PTA for Afib, which was held for IV heparin. Heparin subsequently held on 11/16 due to bleeding from HD lines and low Hgb. Warfarin started 11/18 with no bridge planned. Patient on amiodarone PO   INR today is therapeutic at 2.6. CBC stable on last check. No s/sx of bleeding. Oral intake documented ~50%.   Goal of Therapy:  INR 2-3 Monitor platelets by anticoagulation protocol: Yes   Plan:  Warfarin 3 mg po x 1. Daily INR.  Shanti Agresti A. Levada Dy, PharmD, BCPS, FNKF Clinical Pharmacist [facility] Please utilize Amion for appropriate phone number to reach the unit pharmacist ([place])    09/21/2019 12:18 PM

## 2019-09-21 NOTE — Progress Notes (Signed)
Renal Navigator has been notified by CSW/B. Cobb that patient's significant other has agreed to SNF at [facility] for patient. Now that she has a disposition plan, Renal Navigator is able to make referral for OP HD treatment. Renal Navigator completed and submitted to [facility] Admissions. Renal Navigator will follow closely.   Alphonzo Cruise, [place] Renal Navigator [phone]

## 2019-09-22 LAB — PROTIME-INR
INR: 2.2 — ABNORMAL HIGH (ref 0.8–1.2)
Prothrombin Time: 24.5 seconds — ABNORMAL HIGH (ref 11.4–15.2)

## 2019-09-22 LAB — GLUCOSE, CAPILLARY
Glucose-Capillary: 134 mg/dL — ABNORMAL HIGH (ref 70–99)
Glucose-Capillary: 67 mg/dL — ABNORMAL LOW (ref 70–99)
Glucose-Capillary: 82 mg/dL (ref 70–99)

## 2019-09-22 MED ORDER — WARFARIN SODIUM 2 MG PO TABS
3.0000 mg | ORAL_TABLET | Freq: Once | ORAL | Status: AC
  Administered 2019-09-22: 18:00:00 3 mg via ORAL
  Filled 2019-09-22: qty 1

## 2019-09-22 NOTE — TOC Progression Note (Signed)
Transition of Care [facility]) - Progression Note    Patient Details  Name: NAKYA YONKO MRN: [id number] Date of Birth: 1946-05-31  Transition of Care [facility]) CM/SW Contact  Sharlet Salina Mila Homer, LCSW Phone Number: 09/22/2019, 1:29 PM  Clinical Narrative:  Reviewed chart and patient will discharge to [facility]. Patient was to discharge today, however per nephrology request, patient will discharge to SNF on Saturday, 12/12. Insurance authorization received.    Expected Discharge Plan: Long Term Acute Care (LTAC) Barriers to Discharge: Continued Medical Work up  Expected Discharge Plan and Services Expected Discharge Plan: [place] (LTAC) In-house Referral: NA Discharge Planning Services: CM Consult Post Acute Care Choice: [place] Living arrangements for the past 2 months: Single Family Home Expected Discharge Date: 09/21/19                                   Social Determinants of Health (SDOH) Interventions  No SDOH interventions needed at this time  Readmission Risk Interventions Readmission Risk Prevention Plan 08/08/2019  Transportation Screening Complete  PCP or Specialist Appt within 3-5 Days Complete  HRI or Home Care Consult Complete  Medication Review (RN Care Manager) Complete  Some recent data might be hidden

## 2019-09-22 NOTE — Progress Notes (Signed)
Hypoglycemic Event  CBG: 67  Treatment: 4 oz juice/soda  Symptoms: None  Follow-up CBG: Time:1735 CBG Result:82  Possible Reasons for Event: Inadequate meal intake  Comments/MD notified:Yes    Jaclyn Day

## 2019-09-22 NOTE — Progress Notes (Signed)
  Speech Language Pathology Treatment: Dysphagia  Patient Details Name: Jaclyn Day MRN: [id number] DOB: May 29, 1946 Today's Date: 09/22/2019 Time: [id number] SLP Time Calculation (min) (ACUTE ONLY): 19 min  Assessment / Plan / Recommendation Clinical Impression  Patient was eager to share that she is discharging to [facility] today. She also reports she likes the chopped foods as they are easier to swallow. Observed pt with crackers and water. She had no vocal or respiratory changes with intake. Recommend continuing a Dys 2, chopped/minced diet and thin liquids and Medication crushed in puree upon discharge. Speech Therapy to follow pt at SNF.    HPI HPI: Pt is a 73 yo F admitted for witnessed out of hospital cardiac arrest, unclear etiology, with field ROSC, resultant respiratory failure and encephalopathy s/p TTM; ETT 10/22-11/1. CRRT initiated 10/30. PMH: unilateral congenital absence of kidney, Afib, HTN, CKD, CHF, anemia, mood disorder.  MRI on 11/7 revealed "Several scattered small acute infarcts in the white matter of the superior frontal lobes, left occipital lobe. No associated hemorrhage or mass effect. 2. Several small subacute or perhaps chronic infarcts in the bilateral cerebellum."      SLP Plan  Continue with current plan of care       Recommendations  Diet recommendations: Dysphagia 2 (fine chop);Thin liquid Liquids provided via: Cup Medication Administration: Crushed with puree Supervision: Staff to assist with self feeding;Full supervision/cueing for compensatory strategies Compensations: Minimize environmental distractions;Slow rate;Small sips/bites Postural Changes and/or Swallow Maneuvers: Seated upright 90 degrees;Upright 30-60 min after meal                Oral Care Recommendations: Oral care BID Follow up Recommendations: Skilled Nursing facility;24 hour supervision/assistance SLP Visit Diagnosis: Dysphagia, oropharyngeal phase (R13.12) Plan: Continue  with current plan of care       GO                Wynelle Bourgeois., MA, CCC-SLP 09/22/2019, 11:47 AM

## 2019-09-22 NOTE — Progress Notes (Signed)
Patient seen and examined briefly today.  She has no complaints.  Her heart rate remains around 9 with atrial fibrillation.  Denied any shortness of breath or chest pain.  There has been no change in her clinical picture since yesterday.  She was apparently discharged yesterday however she could not be discharged due to pending COVID-19 test which has resulted now and is negative so she is going to be discharged to skilled nursing facility in a stable condition.

## 2019-09-22 NOTE — Plan of Care (Signed)
  Problem: Education: Goal: Knowledge of General Education information will improve Description: Including pain rating scale, medication(s)/side effects and non-pharmacologic comfort measures Outcome: Progressing   Problem: Clinical Measurements: Goal: Ability to maintain clinical measurements within normal limits will improve Outcome: Progressing Goal: Will remain free from infection Outcome: Progressing Goal: Diagnostic test results will improve Outcome: Progressing Goal: Respiratory complications will improve Outcome: Progressing Goal: Cardiovascular complication will be avoided Outcome: Progressing   Problem: Activity: Goal: Risk for activity intolerance will decrease Outcome: Progressing   Problem: Nutrition: Goal: Adequate nutrition will be maintained Outcome: Progressing   Problem: Elimination: Goal: Will not experience complications related to bowel motility Outcome: Progressing Goal: Will not experience complications related to urinary retention Outcome: Progressing   Problem: Safety: Goal: Ability to remain free from injury will improve Outcome: Progressing   Problem: Skin Integrity: Goal: Risk for impaired skin integrity will decrease Outcome: Progressing  Elesa Hacker, RN

## 2019-09-22 NOTE — Progress Notes (Addendum)
ANTICOAGULATION CONSULT NOTE - Follow-Up Consult  Pharmacy Consult for Warfarin Indication: atrial fibrillation  Allergies  Allergen Reactions  . Codeine Other (See Comments)    Reaction not recalled by family- was told to never take this    Patient Measurements: Height: 5\' 6"  (167.6 cm) Weight: 162 lb 4.1 oz (73.6 kg) IBW/kg (Calculated) : 59.3  Vital Signs: Temp: 98 F (36.7 C) (12/11 1052) Temp Source: Oral (12/11 1052) BP: 88/57 (12/11 1052) Pulse Rate: 79 (12/11 1052)  Labs: Recent Labs    09/20/19 0249 09/21/19 0444 09/21/19 1107 09/22/19 0135  HGB  --   --  7.9*  --   HCT  --   --  26.2*  --   PLT  --   --  293  --   LABPROT 25.4* 27.5*  --  24.5*  INR 2.3* 2.6*  --  2.2*  CREATININE  --   --  3.79*  --     Estimated Creatinine Clearance: 13.8 mL/min (A) (by C-G formula based on SCr of 3.79 mg/dL (H)).   Medical History: Past Medical History:  Diagnosis Date  . Anemia   . Chronic diastolic CHF (congestive heart failure) ([place])   . CKD (chronic kidney disease), stage IV ([place])   . Hypertension   . Persistent atrial fibrillation ([place])    a. dx 01/2019 s/p TEE DCCV.  Marland Kitchen Renal disorder   . Unilateral congenital absence of kidney    Assessment: Pt is a 73 yr old female admitted s/p out of hospital cardiac arrest and TTM. Admit c/b by encephalopathy, AKI which progressed to HD, and ?embolic CVA. Pt on apixaban PTA for Afib, which was held for IV heparin. Heparin subsequently held on 11/16 due to bleeding from HD lines and low Hgb. Warfarin started 11/18 with no bridge planned. Patient on amiodarone PO   INR today is therapeutic at 2.2. Hgb 7.9, plt 293 yesterday. No s/sx of bleeding. Oral intake documented ~50-100%.   Goal of Therapy:  INR 2-3 Monitor platelets by anticoagulation protocol: Yes   Plan:  Warfarin 3 mg po x 1 tonight. Discharge regimen: warfarin 3 mg daily with close INR follow-up Daily INR.  Antonietta Jewel, PharmD, BCCCP Clinical  Pharmacist  Phone: [phone]  Please check AMION for all [place] phone numbers After 10:00 PM, call [place] [phone]  09/22/2019 11:15 AM

## 2019-09-22 NOTE — Progress Notes (Signed)
PROGRESS NOTE  Jaclyn Day [id number] DOB: 04/18/1946   PCP: Cyndi Bender, PA-C  Patient is from: Home  DOA: 08/03/2019 LOS: 38  Brief Narrative / Interim history: 73 year old female with history of congenital unilateral kidney, CKD-4, HTN, persistent A. fib on Eliquis and diastolic CHF, depression/anxiety/schizoaffective disorder and developmental disorder admitted to ICU 08/03/2023 cardiac arrest.  Found to have new systolic CHF with EF of 20 to 25%, global hypokinesis.  Blood culture grew MSSA for which she was started on vancomycin and transition to Ancef on 08/06/2019 which was completed on 09/18/2019 per ID recommendations. Patient had echocardiogram on 08/21/2019 that showed EF of 55 to 60%, moderate LAE and RVSP to 39. Hospital course complicated by A. fib with RVR, acute metabolic encephalopathy/agitation and AKI.    Assessment & Plan: AKI/CKD-4: due to shock from cardiac issues and bacteremia.  Makes fair amount of urine. -Right IJ TDC on 11/11.  Turned down for permanent access due to debility -CRRT 10/30-11/1>>IHD TTS -Monitor urine output -Nephrology managing.  Per my discussion with Dr. Posey Pronto from nephrology, he really wants her to be able to tolerate hemodialysis in a recliner instead of laying flat before she could be discharged.  Per nephrology request, discharge is being canceled today.  Metabolic acidosis/bone mineral disorder/hyperkalemia: due to renal failure. -Nephrology managing  Acute respiratory failure with hypoxia "multifactorial including cardiac arrest, pulmonary edema, bilateral pleural effusion and anxiety.  Resolved.  She is now on room air. -treat treatable causes.  MSSA bacteremia/AV endocarditis: Leukocytosis and bandemia resolved.  Completed course of Ancef for 6 weeks with last dose being on 09/18/2019.  Leukocytosis/bandemia: Resolved.  She remains afebrile.  Monitor CBC every 48 hours.  V. tach/cardiac arrest/acute systolic CHF: Initial  TTE on 10/23 with EF of 20 to 25%.  Repeat TTE on 11/9 with EF of 55 to 60%.  Currently euvolemic except for stable trace LUE edema. -S/p ETT and hypothermia protocol in ICU -Fluid management by HD.  Acute metabolic encephalopathy: Resolved.  Oriented fairly but limited insight -Frequent reorientation and delirium precautions.  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  Patient with significant upper and lower extremity weakness left greater than right mainly in upper extremities. -Continue warfarin per pharmacy .  INR therapeutic.  Persistent A. fib: Rate is much better controlled around 90.  Continue metoprolol 12.5 mg twice daily, amiodarone and Coumadin.  INR therapeutic.   Anemia: Multifactorial-acute blood loss from HD cath 11/15, chronic disease and malnutrition. -Hgb 11.6 (admit & b/l)>> 6.9 (11/10)>1u> 8.2>> 5.9 (11/16)> 8.8>> 7.6>1u>8.8>> 8.4> 7.8> 8.3> 8.4 -On Aranesp and iron per nephro -Optimize nutrition -Monitor H&H  Hypotension: Slightly hypotensive during hemodialysis but not symptomatic. -Off midodrine.   -Dysphagia: SLP and dietitian following.   -Appreciate SLP input-continue dysphagia 1 diet -Cortrak removed 12/4. -Needs assistance with feeding.  Oral thrush: Resolved.   Vitamin D deficiency -On weekly vitamin D 50,000 units  Stage I sacral decubitus ulcer: Not present on admission -Dressing and offloading per RN  Anxiety/depression/schizoaffective disorder/developmental disorder -Continue Depakote and Abilify -Cymbalta has been on hold.  Moderate malnutrition: Nutrition Problem: Moderate Malnutrition Etiology: acute illness(acute respiratory failure, AKI required CRRT, endocarditis)  Signs/Symptoms: mild fat depletion, mild muscle depletion, moderate muscle depletion  Interventions: Supplements and assistance with feeding.   DVT prophylaxis: Subcu heparin Code Status: Full  code Family Communication: No family present.  Patient alert and oriented.  Called patient's significant other [name] Stutts twice today and left  voicemail.  No call returned yet.  Disposition Plan: Remains inpatient.  Final disposition SNF.  Per my discussion with Dr. Posey Pronto from nephrology, he really wants her to be able to tolerate hemodialysis in a recliner instead of laying flat before she could be discharged.  Per nephrology request, discharge is being canceled today. Consultants: Cardiology (signed off), neurology (signed off), IR (signed off), palliative, nephrology  Subjective: Seen and examined in her room.  She has no complaints.  Atrial fibrillation rate controlled with heart rate around 90.  Objective: Vitals:   09/22/19 0500 09/22/19 0740 09/22/19 0844 09/22/19 1052  BP: 106/70 110/66 115/74 (!) 88/57  Pulse: 78 84 88 79  Resp: (!) 21 (!) 24  (!) 26  Temp: 98.2 F (36.8 C) (!) 97.4 F (36.3 C)  98 F (36.7 C)  TempSrc: Oral Oral  Oral  SpO2: 93% 96%  96%  Weight: 73.6 kg     Height:        Intake/Output Summary (Last 24 hours) at 09/22/2019 1323 Last data filed at 09/21/2019 2148 Gross per 24 hour  Intake 3 ml  Output 1418 ml  Net -1415 ml   Filed Weights   09/21/19 0458 09/21/19 0905 09/22/19 0500  Weight: 74.5 kg 74.2 kg 73.6 kg    Examination:  General exam: Appears calm and comfortable  Respiratory system: Clear to auscultation. Respiratory effort normal. Cardiovascular system: S1 & S2 heard, irregularly regular rate and rhythm. No JVD, murmurs, rubs, gallops or clicks. No pedal edema. Gastrointestinal system: Abdomen is nondistended, soft and nontender. No organomegaly or masses felt. Normal bowel sounds heard. Central nervous system: Alert and oriented. No focal neurological deficits. Extremities: Symmetric 5 x 5 power. Skin: No rashes, lesions or ulcers.  Psychiatry: Judgement and insight appear poor, mood & affect flat.   Procedures:  HD cath  placement CRRT IHD  Microbiology summarized: 10/22-COVID-19 negative 10/22-MRSA PCR negative 10/22-respiratory viral panel negative 10/22-urine culture with E. Coli 10/22-blood culture with MSSA in 1/2 bottles 10/26, 10/29, 11/16 and 11/20-blood cultures negative  Sch Meds:  Scheduled Meds: .  stroke: mapping our early stages of recovery book   Does not apply Once  . amiodarone  200 mg Oral Daily  . ARIPiprazole  10 mg Oral BID  . atorvastatin  20 mg Oral Daily  . Chlorhexidine Gluconate Cloth  6 each Topical Q0600  . coumadin book   Does not apply Once  . darbepoetin (ARANESP) injection - DIALYSIS  100 mcg Intravenous Q Sat-HD  . feeding supplement  1 Container Oral TID BM  . feeding supplement (NEPRO CARB STEADY)  237 mL Oral BID BM  . feeding supplement (PRO-STAT SUGAR FREE 64)  30 mL Oral BID  . ferrous sulfate  325 mg Oral Daily  . insulin aspart  0-9 Units Subcutaneous Q4H  . mouth rinse  15 mL Mouth Rinse BID  . metoprolol tartrate  12.5 mg Oral BID  . multivitamin  1 tablet Oral QHS  . pantoprazole  40 mg Oral Daily  . sodium chloride flush  3 mL Intravenous Q12H  . thiamine  100 mg Oral Daily  . valproic acid  250 mg Oral Q6H  . warfarin  3 mg Oral ONCE-1800  . Warfarin - Pharmacist Dosing Inpatient   Does not apply q1800   Continuous Infusions:  PRN Meds:.acetaminophen, heparin, HYDROmorphone (DILAUDID) injection, levalbuterol, lidocaine (PF), lidocaine-prilocaine, pentafluoroprop-tetrafluoroeth, sodium chloride flush  Antimicrobials: Anti-infectives (From admission, onward)   Start     Dose/Rate  Route Frequency Ordered Stop   09/09/19 1800  ceFAZolin (ANCEF) IVPB 2g/100 mL premix  Status:  Discontinued     2 g 200 mL/hr over 30 Minutes Intravenous Every T-Th-Sa (1800) 09/05/19 1024 09/18/19 0932   09/04/19 1800  ceFAZolin (ANCEF) IVPB 2g/100 mL premix     2 g 200 mL/hr over 30 Minutes Intravenous Every M-W-F (1800) 09/04/19 1335 09/06/19 1736   08/31/19  1800  ceFAZolin (ANCEF) IVPB 2g/100 mL premix  Status:  Discontinued     2 g 200 mL/hr over 30 Minutes Intravenous Every T-Th-Sa (1800) 08/31/19 1401 09/04/19 1335   08/30/19 0900  ceFAZolin (ANCEF) IVPB 2g/100 mL premix     2 g 200 mL/hr over 30 Minutes Intravenous  Once 08/30/19 0854 08/30/19 1134   08/24/19 1200  ceFAZolin (ANCEF) IVPB 2g/100 mL premix  Status:  Discontinued     2 g 200 mL/hr over 30 Minutes Intravenous Every T-Th-Sa (Hemodialysis) 08/23/19 0807 08/31/19 1401   08/20/19 2200  ceFAZolin (ANCEF) IVPB 1 g/50 mL premix  Status:  Discontinued     1 g 100 mL/hr over 30 Minutes Intravenous Daily at bedtime 08/20/19 1507 08/23/19 0807   08/15/19 2200  ceFAZolin (ANCEF) IVPB 2g/100 mL premix  Status:  Discontinued     2 g 200 mL/hr over 30 Minutes Intravenous Daily at bedtime 08/15/19 1133 08/20/19 1507   08/11/19 1800  ceFAZolin (ANCEF) IVPB 2g/100 mL premix  Status:  Discontinued     2 g 200 mL/hr over 30 Minutes Intravenous Every 12 hours 08/11/19 1110 08/15/19 1133   08/10/19 1600  ceFAZolin (ANCEF) IVPB 1 g/50 mL premix  Status:  Discontinued     1 g 100 mL/hr over 30 Minutes Intravenous Every 12 hours 08/10/19 1530 08/11/19 1110   08/06/19 1800  ceFAZolin (ANCEF) IVPB 1 g/50 mL premix  Status:  Discontinued     1 g 100 mL/hr over 30 Minutes Intravenous Every 24 hours 08/06/19 1025 08/10/19 1530   08/05/19 1530  vancomycin (VANCOCIN) 1,750 mg in sodium chloride 0.9 % 500 mL IVPB     1,750 mg 250 mL/hr over 120 Minutes Intravenous  Once 08/05/19 1444 08/05/19 1736   08/05/19 1443  vancomycin variable dose per unstable renal function (pharmacist dosing)  Status:  Discontinued      Does not apply See admin instructions 08/05/19 1444 08/06/19 1025   08/03/19 1300  cefTRIAXone (ROCEPHIN) 2 g in sodium chloride 0.9 % 100 mL IVPB  Status:  Discontinued     2 g 200 mL/hr over 30 Minutes Intravenous Every 24 hours 08/03/19 1251 08/06/19 1025       I have personally reviewed  the following labs and images: CBC: Recent Labs  Lab 09/16/19 0728 09/17/19 0431 09/19/19 0355 09/21/19 1107  WBC 9.5 10.0 10.6* 9.2  HGB 7.8* 8.3* 8.4* 7.9*  HCT 25.6* 27.0* 27.5* 26.2*  MCV 94.1 91.8 93.2 94.9  PLT 229 239 259 293   BMP &GFR Recent Labs  Lab 09/16/19 0728 09/17/19 0431 09/18/19 0336 09/19/19 0355 09/19/19 0808 09/20/19 0249 09/21/19 0444 09/21/19 1107  NA 139 138 138  --  138  --   --  137  K 4.3 4.1 4.5  --  4.6  --   --  4.5  CL 99 97* 99  --  98  --   --  99  CO2 26 27 25   --  24  --   --  26  GLUCOSE 96 106* 67*  --  112*  --   --  82  BUN 78* 40* 65*  --  88*  --   --  46*  CREATININE 3.93* 2.42* 3.28*  --  4.32*  --   --  3.79*  CALCIUM 9.2 9.2 9.6  --  9.3  --   --  9.3  MG  --  1.8 2.1 1.9  --  1.9 2.1  --   PHOS 6.7* 4.6 6.3*  --  6.0*  --   --  6.3*   Estimated Creatinine Clearance: 13.8 mL/min (A) (by C-G formula based on SCr of 3.79 mg/dL (H)). Liver & Pancreas: Recent Labs  Lab 09/16/19 0728 09/17/19 0431 09/18/19 0336 09/19/19 0808 09/21/19 1107  ALBUMIN 1.7* 1.8* 1.9* 1.7* 1.9*   No results for input(s): LIPASE, AMYLASE in the last 168 hours. No results for input(s): AMMONIA in the last 168 hours. Diabetic: No results for input(s): HGBA1C in the last 72 hours. Recent Labs  Lab 09/21/19 0003 09/21/19 0520 09/21/19 0808 09/21/19 1528 09/22/19 1050  GLUCAP 92 85 88 107* 134*   Cardiac Enzymes: No results for input(s): CKTOTAL, CKMB, CKMBINDEX, TROPONINI in the last 168 hours. No results for input(s): PROBNP in the last 8760 hours. Coagulation Profile: Recent Labs  Lab 09/18/19 0336 09/19/19 0355 09/20/19 0249 09/21/19 0444 09/22/19 0135  INR 2.1* 2.4* 2.3* 2.6* 2.2*   Thyroid Function Tests: No results for input(s): TSH, T4TOTAL, FREET4, T3FREE, THYROIDAB in the last 72 hours. Lipid Profile: No results for input(s): CHOL, HDL, LDLCALC, TRIG, CHOLHDL, LDLDIRECT in the last 72 hours. Anemia Panel: No results  for input(s): VITAMINB12, FOLATE, FERRITIN, TIBC, IRON, RETICCTPCT in the last 72 hours. Urine analysis:    Component Value Date/Time   COLORURINE BROWN (A) 08/05/2019 2018   APPEARANCEUR TURBID (A) 08/05/2019 2018   LABSPEC RESULTS UNAVAILABLE DUE TO INTERFERING SUBSTANCE 08/05/2019 2018   PHURINE RESULTS UNAVAILABLE DUE TO INTERFERING SUBSTANCE 08/05/2019 2018   GLUCOSEU RESULTS UNAVAILABLE DUE TO INTERFERING SUBSTANCE (A) 08/05/2019 2018   HGBUR RESULTS UNAVAILABLE DUE TO INTERFERING SUBSTANCE (A) 08/05/2019 2018   BILIRUBINUR RESULTS UNAVAILABLE DUE TO INTERFERING SUBSTANCE (A) 08/05/2019 2018   KETONESUR RESULTS UNAVAILABLE DUE TO INTERFERING SUBSTANCE (A) 08/05/2019 2018   PROTEINUR RESULTS UNAVAILABLE DUE TO INTERFERING SUBSTANCE (A) 08/05/2019 2018   UROBILINOGEN 0.2 05/07/2009 1520   NITRITE RESULTS UNAVAILABLE DUE TO INTERFERING SUBSTANCE (A) 08/05/2019 2018   LEUKOCYTESUR RESULTS UNAVAILABLE DUE TO INTERFERING SUBSTANCE (A) 08/05/2019 2018   Sepsis Labs: Invalid input(s): PROCALCITONIN, [place]  Microbiology: Recent Results (from the past 240 hour(s))  MRSA PCR Screening     Status: None   Collection Time: 09/12/19  3:42 PM   Specimen: Nasopharyngeal  Result Value Ref Range Status   MRSA by PCR NEGATIVE NEGATIVE Final    Comment:        The GeneXpert MRSA Assay (FDA approved for NASAL specimens only), is one component of a comprehensive MRSA colonization surveillance program. It is not intended to diagnose MRSA infection nor to guide or monitor treatment for MRSA infections. Performed at [facility], [place] [address] [place], [place] [address]   SARS CORONAVIRUS 2 (TAT 6-24 HRS) Nasopharyngeal Nasopharyngeal Swab     Status: None   Collection Time: 09/21/19  5:22 PM   Specimen: Nasopharyngeal Swab  Result Value Ref Range Status   SARS Coronavirus 2 NEGATIVE NEGATIVE Final    Comment: (NOTE) SARS-CoV-2 target nucleic acids are NOT DETECTED. The  SARS-CoV-2 RNA is generally detectable  in upper and lower respiratory specimens during the acute phase of infection. Negative results do not preclude SARS-CoV-2 infection, do not rule out co-infections with other pathogens, and should not be used as the sole basis for treatment or other patient management decisions. Negative results must be combined with clinical observations, patient history, and epidemiological information. The expected result is Negative. Fact Sheet for Patients: SugarRoll.be Fact Sheet for Healthcare Providers: https://www.woods-mathews.com/ This test is not yet approved or cleared by the [place] FDA and  has been authorized for detection and/or diagnosis of SARS-CoV-2 by FDA under an Emergency Use Authorization (EUA). This EUA will remain  in effect (meaning this test can be used) for the duration of the COVID-19 declaration under Section 56 4(b)(1) of the Act, 21 U.S.C. section 360bbb-3(b)(1), unless the authorization is terminated or revoked sooner. Performed at [facility], [place] [address] [place], [place] [address]     Radiology Studies: No results found.  Total time spent 30 minutes Darliss Cheney, MD [facility]  If 7PM-7AM, please contact night-coverage [email or web address] Password [email or web address] 09/22/2019, 1:23 PM

## 2019-09-22 NOTE — Progress Notes (Signed)
Pt tolerated sitting in a recliner about 4 hours.  Idolina Primer, RN

## 2019-09-22 NOTE — Progress Notes (Signed)
Patient ID: WAUNETA HANFORD, female   DOB: 17-Mar-1946, 73 y.o.   MRN: [id number]  [facility] Progress Note    Assessment/ Plan:   1. Acute kidney Injury on chronic kidney disease stage IV-now likely ESRD: Secondary to ATN following cardiac arrest and MSSA bacteremia.  On hemodialysis via tunneled catheter as permanent access was previously precluded by her MSSA bacteremia/sepsis (the goal would be to refer her to vascular surgery as an outpatient for this.  I will undertake hemodialysis tomorrow in a recliner to assess her stability for outpatient hemodialysis.   2.  MSSA bacteremia/sepsis: S/P intravenous Ancef, source suspected to be from cellulitis of the lower extremities. 3.  Status post cardiac arrest: With significant deconditioning during his hospitalization.  Anticipate continued PT/OT. 4.  History of paroxysmal atrial fibrillation: Rate controlled, remains on amiodarone with anticoagulation per cardiology. 5.  Protein calorie malnutrition/deconditioning following acute illness: Anticipate prolonged rehabilitation course and plans noted for discharge to skilled nursing facility after acceptance for outpatient hemodialysis.  Subjective:   She reports that she felt rough after dialysis yesterday.  Feels better today and notes that she might have over eaten during breakfast.   Objective:   BP (!) 88/57 (BP Location: Left Arm)   Pulse 79   Temp 98 F (36.7 C) (Oral)   Resp (!) 26   Ht 5\' 6"  (1.676 m)   Wt 73.6 kg   SpO2 96%   BMI 26.19 kg/m   Intake/Output Summary (Last 24 hours) at 09/22/2019 1201 Last data filed at 09/21/2019 2148 Gross per 24 hour  Intake 3 ml  Output 1418 ml  Net -1415 ml   Weight change: -0.3 kg  Physical Exam: Gen: AA resting in bed, boyfriend at bedside CVS: Pulse irregularly irregular- normal rate, S1 and S2 normal Resp: Anteriorly clear to auscultation without distinct rales/wheeze. RIJ TDC Abd: Soft, obese, nontender Ext: Trace  lower extremity edema  Imaging: No results found.  Labs: BMET Recent Labs  Lab 09/15/19 1647 09/16/19 0728 09/17/19 0431 09/18/19 0336 09/19/19 0808 09/21/19 1107  NA 136 139 138 138 138 137  K 4.4 4.3 4.1 4.5 4.6 4.5  CL 99 99 97* 99 98 99  CO2 25 26 27 25 24 26   GLUCOSE 102* 96 106* 67* 112* 82  BUN 68* 78* 40* 65* 88* 46*  CREATININE 3.69* 3.93* 2.42* 3.28* 4.32* 3.79*  CALCIUM 9.2 9.2 9.2 9.6 9.3 9.3  PHOS 5.9* 6.7* 4.6 6.3* 6.0* 6.3*   CBC Recent Labs  Lab 09/16/19 0728 09/17/19 0431 09/19/19 0355 09/21/19 1107  WBC 9.5 10.0 10.6* 9.2  HGB 7.8* 8.3* 8.4* 7.9*  HCT 25.6* 27.0* 27.5* 26.2*  MCV 94.1 91.8 93.2 94.9  PLT 229 239 259 293    Medications:    .  stroke: mapping our early stages of recovery book   Does not apply Once  . amiodarone  200 mg Oral Daily  . ARIPiprazole  10 mg Oral BID  . atorvastatin  20 mg Oral Daily  . Chlorhexidine Gluconate Cloth  6 each Topical Q0600  . coumadin book   Does not apply Once  . darbepoetin (ARANESP) injection - DIALYSIS  100 mcg Intravenous Q Sat-HD  . feeding supplement  1 Container Oral TID BM  . feeding supplement (NEPRO CARB STEADY)  237 mL Oral BID BM  . feeding supplement (PRO-STAT SUGAR FREE 64)  30 mL Oral BID  . ferrous sulfate  325 mg Oral Daily  . insulin aspart  0-9 Units Subcutaneous Q4H  . mouth rinse  15 mL Mouth Rinse BID  . metoprolol tartrate  12.5 mg Oral BID  . multivitamin  1 tablet Oral QHS  . pantoprazole  40 mg Oral Daily  . sodium chloride flush  3 mL Intravenous Q12H  . thiamine  100 mg Oral Daily  . valproic acid  250 mg Oral Q6H  . warfarin  3 mg Oral ONCE-1800  . Warfarin - Pharmacist Dosing Inpatient   Does not apply [id number]   Elmarie Shiley, MD 09/22/2019, 12:01 PM

## 2019-09-22 NOTE — Progress Notes (Signed)
Patient has been accepted to [facility] with a seat time of 11:40am on a TTS schedule. She needs to arrive at 11:00am on her first day of treatment, 09/26/19 to complete intake paperwork. Renal Navigator has spoken with hospital CSW to request that her significant other be present to sign consents with her. After her first treatment, she should arrive to her appointments at 11:20am.   Alphonzo Cruise, [place] Renal Navigator [phone]

## 2019-09-23 LAB — GLUCOSE, CAPILLARY
Glucose-Capillary: 122 mg/dL — ABNORMAL HIGH (ref 70–99)
Glucose-Capillary: 107 mg/dL — ABNORMAL HIGH (ref 70–99)
Glucose-Capillary: 103 mg/dL — ABNORMAL HIGH (ref 70–99)
Glucose-Capillary: 106 mg/dL — ABNORMAL HIGH (ref 70–99)
Glucose-Capillary: 78 mg/dL (ref 70–99)
Glucose-Capillary: 83 mg/dL (ref 70–99)
Glucose-Capillary: 85 mg/dL (ref 70–99)
Glucose-Capillary: 92 mg/dL (ref 70–99)
Glucose-Capillary: 93 mg/dL (ref 70–99)
Glucose-Capillary: 122 mg/dL — ABNORMAL HIGH (ref 70–99)

## 2019-09-23 LAB — PROTIME-INR
INR: 2.3 — ABNORMAL HIGH (ref 0.8–1.2)
Prothrombin Time: 25.4 seconds — ABNORMAL HIGH (ref 11.4–15.2)

## 2019-09-23 MED ORDER — HEPARIN SODIUM (PORCINE) 1000 UNIT/ML IJ SOLN
INTRAMUSCULAR | Status: AC
  Filled 2019-09-23: qty 4

## 2019-09-23 MED ORDER — WARFARIN SODIUM 2 MG PO TABS
3.0000 mg | ORAL_TABLET | Freq: Once | ORAL | Status: AC
  Administered 2019-09-23: 3 mg via ORAL
  Filled 2019-09-23: qty 1

## 2019-09-23 NOTE — Progress Notes (Signed)
Patient ID: Jaclyn Day, female   DOB: [birth year], 73 y.o.   MRN: [id number]  [facility] Progress Note    Assessment/ Plan:   1. Acute kidney Injury on chronic kidney disease stage IV-now likely ESRD: Secondary to ATN following cardiac arrest and MSSA bacteremia.  On TTS hemodialysis via tunneled catheter as permanent access was previously precluded by her MSSA bacteremia/sepsis (the goal would be to refer her to vascular surgery as an outpatient for this. Tolerating HD in recliner today.   2.  MSSA bacteremia/sepsis: S/P completion of intravenous Ancef, source suspected to be from cellulitis of the lower extremities. 3.  Status post cardiac arrest: With significant deconditioning during his hospitalization.  Anticipate continued PT/OT at SNF. 4.  History of paroxysmal atrial fibrillation: Rate controlled, remains on amiodarone with anticoagulation on warfarin. 5.  Protein calorie malnutrition/deconditioning following acute illness: Anticipate prolonged rehabilitation course and plans noted for discharge to skilled nursing facility. Continue OP HD.   Subjective:   Denies any complaints at this time with plans noted for possible DC to SNF today.   Objective:   BP 98/67 (BP Location: Left Arm)   Pulse 86   Temp 98 F (36.7 C) (Oral)   Resp 19   Ht 5\' 6"  (1.676 m)   Wt 73.1 kg   SpO2 92%   BMI 26.01 kg/m   Intake/Output Summary (Last 24 hours) at 09/23/2019 1001 Last data filed at 09/22/2019 1800 Gross per 24 hour  Intake 260 ml  Output -  Net 260 ml   Weight change: -1.1 kg  Physical Exam: Gen: appears comfortable undergoing hemodialysis in a recliner (needed Hoyer lift) CVS: Pulse irregularly irregular- normal rate, S1 and S2 normal Resp: Anteriorly clear to auscultation without distinct rales/wheeze. RIJ TDC Abd: Soft, obese, nontender Ext: Trace lower extremity edema  Imaging: No results found.  Labs: BMET Recent Labs  Lab 09/17/19 0431  09/18/19 0336 09/19/19 0808 09/21/19 1107  NA 138 138 138 137  K 4.1 4.5 4.6 4.5  CL 97* 99 98 99  CO2 27 25 24 26   GLUCOSE 106* 67* 112* 82  BUN 40* 65* 88* 46*  CREATININE 2.42* 3.28* 4.32* 3.79*  CALCIUM 9.2 9.6 9.3 9.3  PHOS 4.6 6.3* 6.0* 6.3*   CBC Recent Labs  Lab 09/17/19 0431 09/19/19 0355 09/21/19 1107  WBC 10.0 10.6* 9.2  HGB 8.3* 8.4* 7.9*  HCT 27.0* 27.5* 26.2*  MCV 91.8 93.2 94.9  PLT 239 259 293    Medications:    .  stroke: mapping our early stages of recovery book   Does not apply Once  . amiodarone  200 mg Oral Daily  . ARIPiprazole  10 mg Oral BID  . atorvastatin  20 mg Oral Daily  . Chlorhexidine Gluconate Cloth  6 each Topical Q0600  . coumadin book   Does not apply Once  . darbepoetin (ARANESP) injection - DIALYSIS  100 mcg Intravenous Q Sat-HD  . feeding supplement  1 Container Oral TID BM  . feeding supplement (NEPRO CARB STEADY)  237 mL Oral BID BM  . feeding supplement (PRO-STAT SUGAR FREE 64)  30 mL Oral BID  . ferrous sulfate  325 mg Oral Daily  . insulin aspart  0-9 Units Subcutaneous Q4H  . mouth rinse  15 mL Mouth Rinse BID  . metoprolol tartrate  12.5 mg Oral BID  . multivitamin  1 tablet Oral QHS  . pantoprazole  40 mg Oral Daily  . sodium chloride  flush  3 mL Intravenous Q12H  . thiamine  100 mg Oral Daily  . valproic acid  250 mg Oral Q6H  . warfarin  3 mg Oral ONCE-1800  . Warfarin - Pharmacist Dosing Inpatient   Does not apply [id number]   Elmarie Shiley, MD 09/23/2019, 10:01 AM

## 2019-09-23 NOTE — Progress Notes (Signed)
ANTICOAGULATION CONSULT NOTE - Follow-Up Consult  Pharmacy Consult for Warfarin Indication: atrial fibrillation  Allergies  Allergen Reactions  . Codeine Other (See Comments)    Reaction not recalled by family- was told to never take this    Patient Measurements: Height: 5\' 6"  (167.6 cm) Weight: 161 lb 2.5 oz (73.1 kg) IBW/kg (Calculated) : 59.3  Vital Signs: Temp: 98 F (36.7 C) (12/12 0415) Temp Source: Oral (12/12 0415) BP: 98/67 (12/12 0415) Pulse Rate: 86 (12/12 0415)  Labs: Recent Labs    09/21/19 0444 09/21/19 1107 09/22/19 0135 09/23/19 0345  HGB  --  7.9*  --   --   HCT  --  26.2*  --   --   PLT  --  293  --   --   LABPROT 27.5*  --  24.5* 25.4*  INR 2.6*  --  2.2* 2.3*  CREATININE  --  3.79*  --   --     Estimated Creatinine Clearance: 13.7 mL/min (A) (by C-G formula based on SCr of 3.79 mg/dL (H)).   Medical History: Past Medical History:  Diagnosis Date  . Anemia   . Chronic diastolic CHF (congestive heart failure) ([place])   . CKD (chronic kidney disease), stage IV ([place])   . Hypertension   . Persistent atrial fibrillation ([place])    a. dx 01/2019 s/p TEE DCCV.  Marland Kitchen Renal disorder   . Unilateral congenital absence of kidney    Assessment: Pt is a 73 yr old female admitted s/p out of hospital cardiac arrest and TTM. Admit c/b by encephalopathy, AKI which progressed to HD, and ?embolic CVA. Pt on apixaban PTA for Afib, which was held for IV heparin. Heparin subsequently held on 11/16 due to bleeding from HD lines and low Hgb. Warfarin started 11/18 with no bridge planned. Patient on amiodarone PO   INR today is therapeutic at 2.3. No CBC, no S/Sx bleeding documented.   Goal of Therapy:  INR 2-3 Monitor platelets by anticoagulation protocol: Yes   Plan:  Warfarin 3 mg po x 1 tonight. Discharge regimen: warfarin 3 mg daily with close INR follow-up Daily INR   Arrie Senate, PharmD, BCPS Clinical Pharmacist Please check AMION for all [place]  numbers 09/23/2019

## 2019-09-23 NOTE — Progress Notes (Signed)
PROGRESS NOTE  Jaclyn Day [id number] DOB: [birth year]   PCP: Cyndi Bender, PA-C  Patient is from: Home  DOA: 08/03/2019 LOS: 34  Brief Narrative / Interim history: 73 year old female with history of congenital unilateral kidney, CKD-4, HTN, persistent A. fib on Eliquis and diastolic CHF, depression/anxiety/schizoaffective disorder and developmental disorder admitted to ICU 08/03/2023 cardiac arrest.  Found to have new systolic CHF with EF of 20 to 25%, global hypokinesis.  Blood culture grew MSSA for which she was started on vancomycin and transition to Ancef on 08/06/2019 which was completed on 09/18/2019 per ID recommendations. Patient had echocardiogram on 08/21/2019 that showed EF of 55 to 60%, moderate LAE and RVSP to 39. Hospital course complicated by A. fib with RVR, acute metabolic encephalopathy/agitation and AKI.    Assessment & Plan: AKI/CKD-4: Present on admission, due to shock from cardiac issues and bacteremia.  Makes fair amount of urine. -Right IJ TDC on 11/11.  Turned down for permanent access due to debility -CRRT 10/30-11/1>>IHD TTS -Monitor urine output -Nephrology managing.  Per my discussion with Dr. Posey Pronto on 09/22/2019 from nephrology, he really wants her to be able to tolerate hemodialysis in a recliner instead of laying flat before she could be discharged.  Per nephrology request, discharge was canceled on 09/22/2019.  Patient was able to tolerate hemodialysis in a recliner and nephrology cleared the patient.  I contacted social worker around 10 AM this morning and requested arranging transfer to nursing home.  Up until 3:10 PM.  Per Education officer, museum, she had left 3 voicemails to the nursing facility today.  She had not heard back from them.  She will not be able to discharge today for that reason.  Metabolic acidosis/bone mineral disorder/hyperkalemia, present on admission: due to renal failure. -Nephrology managing  Acute respiratory failure with hypoxia  "multifactorial including cardiac arrest, pulmonary edema, bilateral pleural effusion and anxiety, present on admission.  Resolved.  She is now on room air. -treat treatable causes.  MSSA bacteremia/AV endocarditis,: Leukocytosis and bandemia resolved.  Completed course of Ancef for 6 weeks with last dose being on 09/18/2019.  Leukocytosis/bandemia: Resolved.  She remains afebrile.  Monitor CBC every 48 hours.  V. tach/cardiac arrest/acute systolic CHF: Initial TTE on 10/23 with EF of 20 to 25%.  Repeat TTE on 11/9 with EF of 55 to 60%.  Currently euvolemic except for stable trace LUE edema. -S/p ETT and hypothermia protocol in ICU -Fluid management by HD.  Acute metabolic encephalopathy: Resolved.  Oriented fairly but limited insight -Frequent reorientation and delirium precautions.  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  Patient with significant upper and lower extremity weakness left greater than right mainly in upper extremities. -Continue warfarin per pharmacy .  INR therapeutic.  Persistent A. fib: Rate is much better controlled around 90.  Continue metoprolol 12.5 mg twice daily, amiodarone and Coumadin.  INR therapeutic.   Anemia: Multifactorial-acute blood loss from HD cath 11/15, chronic disease and malnutrition. -Hgb 11.6 (admit & b/l)>> 6.9 (11/10)>1u> 8.2>> 5.9 (11/16)> 8.8>> 7.6>1u>8.8>> 8.4> 7.8> 8.3> 8.4 -On Aranesp and iron per nephro -Optimize nutrition -Monitor H&H  Hypotension: Slightly hypotensive during hemodialysis but not symptomatic. -Off midodrine.   -Dysphagia: SLP and dietitian following.   -Appreciate SLP input-continue dysphagia 1 diet -Cortrak removed 12/4. -Needs assistance with feeding.  Oral thrush: Resolved.   Vitamin D deficiency -On weekly vitamin D 50,000 units  Stage I sacral decubitus ulcer: Not present on admission -Dressing and  offloading per RN   Anxiety/depression/schizoaffective disorder/developmental disorder -Continue Depakote and Abilify -Cymbalta has been on hold.  Moderate malnutrition: Nutrition Problem: Moderate Malnutrition Etiology: acute illness(acute respiratory failure, AKI required CRRT, endocarditis)  Signs/Symptoms: mild fat depletion, mild muscle depletion, moderate muscle depletion  Interventions: Supplements and assistance with feeding.   DVT prophylaxis: Subcu heparin Code Status: Full code Family Communication: No family present.  Patient alert and oriented.  Disposition Plan: Remains inpatient.  Final disposition SNF.   I contacted social worker around 10 AM this morning and requested arranging transfer to nursing home.  Up until 3:10 PM.  Per Education officer, museum, she had left 3 voicemails to the nursing facility today.  She had not heard back from them.  She will not be able to discharge today for that reason. Consultants: Cardiology (signed off), neurology (signed off), IR (signed off), palliative, nephrology  Subjective: Seen and examined in dialysis unit.  She was getting dialysis in a recliner.No shortness of breath or any other complaint.  Objective: Vitals:   09/23/19 1100 09/23/19 1130 09/23/19 1145 09/23/19 1328  BP: (!) 93/54 (!) 98/52 (!) 106/47 (!) 97/50  Pulse: 87 87 84 91  Resp: 20 20 20    Temp:      TempSrc:      SpO2:      Weight:      Height:        Intake/Output Summary (Last 24 hours) at 09/23/2019 1509 Last data filed at 09/23/2019 1145 Gross per 24 hour  Intake 260 ml  Output 500 ml  Net -240 ml   Filed Weights   09/21/19 0905 09/22/19 0500 09/23/19 0415  Weight: 74.2 kg 73.6 kg 73.1 kg    Examination:  General exam: Appears calm and comfortable  Respiratory system: Diminished breath sounds at the bases bilaterally respiratory effort normal. Cardiovascular system: S1 & S2 heard, RRR. No JVD, murmurs, rubs, gallops or clicks. No pedal edema. Gastrointestinal system:  Abdomen is nondistended, soft and nontender. No organomegaly or masses felt. Normal bowel sounds heard. Central nervous system: Alert and oriented. No focal neurological deficits. Extremities: Symmetric 5 x 5 power. Skin: No rashes, lesions or ulcers.  Psychiatry: Judgement and insight appear poor, mood & affect flat.  Procedures:  HD cath placement CRRT IHD  Microbiology summarized: 10/22-COVID-19 negative 10/22-MRSA PCR negative 10/22-respiratory viral panel negative 10/22-urine culture with E. Coli 10/22-blood culture with MSSA in 1/2 bottles 10/26, 10/29, 11/16 and 11/20-blood cultures negative  Sch Meds:  Scheduled Meds: .  stroke: mapping our early stages of recovery book   Does not apply Once  . amiodarone  200 mg Oral Daily  . ARIPiprazole  10 mg Oral BID  . atorvastatin  20 mg Oral Daily  . Chlorhexidine Gluconate Cloth  6 each Topical Q0600  . coumadin book   Does not apply Once  . darbepoetin (ARANESP) injection - DIALYSIS  100 mcg Intravenous Q Sat-HD  . feeding supplement  1 Container Oral TID BM  . feeding supplement (NEPRO CARB STEADY)  237 mL Oral BID BM  . feeding supplement (PRO-STAT SUGAR FREE 64)  30 mL Oral BID  . ferrous sulfate  325 mg Oral Daily  . heparin      . insulin aspart  0-9 Units Subcutaneous Q4H  . mouth rinse  15 mL Mouth Rinse BID  . metoprolol tartrate  12.5 mg Oral BID  . multivitamin  1 tablet Oral QHS  . pantoprazole  40 mg Oral Daily  . sodium chloride  flush  3 mL Intravenous Q12H  . thiamine  100 mg Oral Daily  . valproic acid  250 mg Oral Q6H  . warfarin  3 mg Oral ONCE-1800  . Warfarin - Pharmacist Dosing Inpatient   Does not apply q1800   Continuous Infusions:  PRN Meds:.acetaminophen, heparin, HYDROmorphone (DILAUDID) injection, levalbuterol, lidocaine (PF), lidocaine-prilocaine, pentafluoroprop-tetrafluoroeth, sodium chloride flush  Antimicrobials: Anti-infectives (From admission, onward)   Start     Dose/Rate Route  Frequency Ordered Stop   09/09/19 1800  ceFAZolin (ANCEF) IVPB 2g/100 mL premix  Status:  Discontinued     2 g 200 mL/hr over 30 Minutes Intravenous Every T-Th-Sa (1800) 09/05/19 1024 09/18/19 0932   09/04/19 1800  ceFAZolin (ANCEF) IVPB 2g/100 mL premix     2 g 200 mL/hr over 30 Minutes Intravenous Every M-W-F (1800) 09/04/19 1335 09/06/19 1736   08/31/19 1800  ceFAZolin (ANCEF) IVPB 2g/100 mL premix  Status:  Discontinued     2 g 200 mL/hr over 30 Minutes Intravenous Every T-Th-Sa (1800) 08/31/19 1401 09/04/19 1335   08/30/19 0900  ceFAZolin (ANCEF) IVPB 2g/100 mL premix     2 g 200 mL/hr over 30 Minutes Intravenous  Once 08/30/19 0854 08/30/19 1134   08/24/19 1200  ceFAZolin (ANCEF) IVPB 2g/100 mL premix  Status:  Discontinued     2 g 200 mL/hr over 30 Minutes Intravenous Every T-Th-Sa (Hemodialysis) 08/23/19 0807 08/31/19 1401   08/20/19 2200  ceFAZolin (ANCEF) IVPB 1 g/50 mL premix  Status:  Discontinued     1 g 100 mL/hr over 30 Minutes Intravenous Daily at bedtime 08/20/19 1507 08/23/19 0807   08/15/19 2200  ceFAZolin (ANCEF) IVPB 2g/100 mL premix  Status:  Discontinued     2 g 200 mL/hr over 30 Minutes Intravenous Daily at bedtime 08/15/19 1133 08/20/19 1507   08/11/19 1800  ceFAZolin (ANCEF) IVPB 2g/100 mL premix  Status:  Discontinued     2 g 200 mL/hr over 30 Minutes Intravenous Every 12 hours 08/11/19 1110 08/15/19 1133   08/10/19 1600  ceFAZolin (ANCEF) IVPB 1 g/50 mL premix  Status:  Discontinued     1 g 100 mL/hr over 30 Minutes Intravenous Every 12 hours 08/10/19 1530 08/11/19 1110   08/06/19 1800  ceFAZolin (ANCEF) IVPB 1 g/50 mL premix  Status:  Discontinued     1 g 100 mL/hr over 30 Minutes Intravenous Every 24 hours 08/06/19 1025 08/10/19 1530   08/05/19 1530  vancomycin (VANCOCIN) 1,750 mg in sodium chloride 0.9 % 500 mL IVPB     1,750 mg 250 mL/hr over 120 Minutes Intravenous  Once 08/05/19 1444 08/05/19 1736   08/05/19 1443  vancomycin variable dose per  unstable renal function (pharmacist dosing)  Status:  Discontinued      Does not apply See admin instructions 08/05/19 1444 08/06/19 1025   08/03/19 1300  cefTRIAXone (ROCEPHIN) 2 g in sodium chloride 0.9 % 100 mL IVPB  Status:  Discontinued     2 g 200 mL/hr over 30 Minutes Intravenous Every 24 hours 08/03/19 1251 08/06/19 1025       I have personally reviewed the following labs and images: CBC: Recent Labs  Lab 09/17/19 0431 09/19/19 0355 09/21/19 1107  WBC 10.0 10.6* 9.2  HGB 8.3* 8.4* 7.9*  HCT 27.0* 27.5* 26.2*  MCV 91.8 93.2 94.9  PLT 239 259 293   BMP &GFR Recent Labs  Lab 09/17/19 0431 09/18/19 0336 09/19/19 0355 09/19/19 0808 09/20/19 0249 09/21/19 0444 09/21/19 1107  NA  138 138  --  138  --   --  137  K 4.1 4.5  --  4.6  --   --  4.5  CL 97* 99  --  98  --   --  99  CO2 27 25  --  24  --   --  26  GLUCOSE 106* 67*  --  112*  --   --  82  BUN 40* 65*  --  88*  --   --  46*  CREATININE 2.42* 3.28*  --  4.32*  --   --  3.79*  CALCIUM 9.2 9.6  --  9.3  --   --  9.3  MG 1.8 2.1 1.9  --  1.9 2.1  --   PHOS 4.6 6.3*  --  6.0*  --   --  6.3*   Estimated Creatinine Clearance: 13.7 mL/min (A) (by C-G formula based on SCr of 3.79 mg/dL (H)). Liver & Pancreas: Recent Labs  Lab 09/17/19 0431 09/18/19 0336 09/19/19 0808 09/21/19 1107  ALBUMIN 1.8* 1.9* 1.7* 1.9*   No results for input(s): LIPASE, AMYLASE in the last 168 hours. No results for input(s): AMMONIA in the last 168 hours. Diabetic: No results for input(s): HGBA1C in the last 72 hours. Recent Labs  Lab 09/21/19 1528 09/22/19 1050 09/22/19 1643 09/22/19 1735 09/23/19 1355  GLUCAP 107* 134* 67* 82 122*   Cardiac Enzymes: No results for input(s): CKTOTAL, CKMB, CKMBINDEX, TROPONINI in the last 168 hours. No results for input(s): PROBNP in the last 8760 hours. Coagulation Profile: Recent Labs  Lab 09/19/19 0355 09/20/19 0249 09/21/19 0444 09/22/19 0135 09/23/19 0345  INR 2.4* 2.3* 2.6*  2.2* 2.3*   Thyroid Function Tests: No results for input(s): TSH, T4TOTAL, FREET4, T3FREE, THYROIDAB in the last 72 hours. Lipid Profile: No results for input(s): CHOL, HDL, LDLCALC, TRIG, CHOLHDL, LDLDIRECT in the last 72 hours. Anemia Panel: No results for input(s): VITAMINB12, FOLATE, FERRITIN, TIBC, IRON, RETICCTPCT in the last 72 hours. Urine analysis:    Component Value Date/Time   COLORURINE BROWN (A) 08/05/2019 2018   APPEARANCEUR TURBID (A) 08/05/2019 2018   LABSPEC RESULTS UNAVAILABLE DUE TO INTERFERING SUBSTANCE 08/05/2019 2018   PHURINE RESULTS UNAVAILABLE DUE TO INTERFERING SUBSTANCE 08/05/2019 2018   GLUCOSEU RESULTS UNAVAILABLE DUE TO INTERFERING SUBSTANCE (A) 08/05/2019 2018   HGBUR RESULTS UNAVAILABLE DUE TO INTERFERING SUBSTANCE (A) 08/05/2019 2018   BILIRUBINUR RESULTS UNAVAILABLE DUE TO INTERFERING SUBSTANCE (A) 08/05/2019 2018   KETONESUR RESULTS UNAVAILABLE DUE TO INTERFERING SUBSTANCE (A) 08/05/2019 2018   PROTEINUR RESULTS UNAVAILABLE DUE TO INTERFERING SUBSTANCE (A) 08/05/2019 2018   UROBILINOGEN 0.2 05/07/2009 1520   NITRITE RESULTS UNAVAILABLE DUE TO INTERFERING SUBSTANCE (A) 08/05/2019 2018   LEUKOCYTESUR RESULTS UNAVAILABLE DUE TO INTERFERING SUBSTANCE (A) 08/05/2019 2018   Sepsis Labs: Invalid input(s): PROCALCITONIN, [place]  Microbiology: Recent Results (from the past 240 hour(s))  SARS CORONAVIRUS 2 (TAT 6-24 HRS) Nasopharyngeal Nasopharyngeal Swab     Status: None   Collection Time: 09/21/19  5:22 PM   Specimen: Nasopharyngeal Swab  Result Value Ref Range Status   SARS Coronavirus 2 NEGATIVE NEGATIVE Final    Comment: (NOTE) SARS-CoV-2 target nucleic acids are NOT DETECTED. The SARS-CoV-2 RNA is generally detectable in upper and lower respiratory specimens during the acute phase of infection. Negative results do not preclude SARS-CoV-2 infection, do not rule out co-infections with other pathogens, and should not be used as the sole basis  for treatment or other patient management decisions.  Negative results must be combined with clinical observations, patient history, and epidemiological information. The expected result is Negative. Fact Sheet for Patients: SugarRoll.be Fact Sheet for Healthcare Providers: https://www.woods-mathews.com/ This test is not yet approved or cleared by the [place] FDA and  has been authorized for detection and/or diagnosis of SARS-CoV-2 by FDA under an Emergency Use Authorization (EUA). This EUA will remain  in effect (meaning this test can be used) for the duration of the COVID-19 declaration under Section 56 4(b)(1) of the Act, 21 U.S.C. section 360bbb-3(b)(1), unless the authorization is terminated or revoked sooner. Performed at [facility], [place] [address], [place], [place] [address]     Radiology Studies: No results found.  Total time spent 32 minutes Darliss Cheney, MD [facility]  If 7PM-7AM, please contact night-coverage [email or web address] Password [email or web address] 09/23/2019, 3:09 PM

## 2019-09-23 NOTE — TOC Progression Note (Addendum)
Transition of Care [facility]) - Progression Note    Patient Details  Name: MACHELLE CHERIAN MRN: [id number] Date of Birth: 1946-01-01  Transition of Care [facility]) CM/SW [place], [place] Phone Number: [phone] 09/23/2019, 10:44 AM  Clinical Narrative:     CSW spoke with MD and patient is medically stable for discharge. CSW called [facility] to ensure bed readiness and had to leave a message.  CSW will continue to follow for discharge planning needs.  12:40pm: CSW called facility again and was not able to reach anyone. CSW left another message.  3:05pm- called facility and still was not able to reach anyone. Will follow up with facility again in the morning.  Expected Discharge Plan: Long Term Acute Care (LTAC) Barriers to Discharge: Continued Medical Work up  Expected Discharge Plan and Services Expected Discharge Plan: [place] (LTAC) In-house Referral: NA Discharge Planning Services: CM Consult Post Acute Care Choice: [place] arrangements for the past 2 months: Single Family Home Expected Discharge Date: 09/21/19                                     Social Determinants of Health (SDOH) Interventions    Readmission Risk Interventions Readmission Risk Prevention Plan 08/08/2019  Transportation Screening Complete  PCP or Specialist Appt within 3-5 Days Complete  HRI or Home Care Consult Complete  Medication Review (RN Care Manager) Complete  Some recent data might be hidden

## 2019-09-23 NOTE — Procedures (Signed)
Patient seen on Hemodialysis. BP 98/67 (BP Location: Left Arm)   Pulse 86   Temp 98 F (36.7 C) (Oral)   Resp 19   Ht 5\' 6"  (1.676 m)   Wt 73.1 kg   SpO2 92%   BMI 26.01 kg/m   QB 400, UF goal 1L Tolerating treatment without complaints at this time while sitting in recliner.   Elmarie Shiley MD [facility]. Office # [phone] Pager # [phone] 10:07 AM

## 2019-09-24 LAB — GLUCOSE, CAPILLARY
Glucose-Capillary: 100 mg/dL — ABNORMAL HIGH (ref 70–99)
Glucose-Capillary: 115 mg/dL — ABNORMAL HIGH (ref 70–99)
Glucose-Capillary: 79 mg/dL (ref 70–99)
Glucose-Capillary: 80 mg/dL (ref 70–99)
Glucose-Capillary: 96 mg/dL (ref 70–99)

## 2019-09-24 LAB — PROTIME-INR
INR: 2.3 — ABNORMAL HIGH (ref 0.8–1.2)
Prothrombin Time: 25.6 seconds — ABNORMAL HIGH (ref 11.4–15.2)

## 2019-09-24 MED ORDER — WARFARIN SODIUM 2 MG PO TABS: 3.0000 mg | ORAL_TABLET | Freq: Once | ORAL | Status: DC

## 2019-09-24 NOTE — TOC Progression Note (Addendum)
Transition of Care [facility]) - Progression Note    Patient Details  Name: Jaclyn Day MRN: [id number] Date of Birth: 27-Sep-1946  Transition of Care [facility]) CM/SW [place], [place] Phone Number:  [phone] 09/24/2019, 8:34 AM  Clinical Narrative:    CSW called the admission coordinator at [facility] again and still was not able to reach them. CSW called the main number and no one answered. CSW updated MD and RN about efforts.  9:07 AM: CSW called facility back and received an answer and was transferred and no one answered. CSW has called back 3 times with no answer.     CSW will continue to follow for discharge planning needs.    Expected Discharge Plan: Long Term Acute Care (LTAC) Barriers to Discharge: Continued Medical Work up  Expected Discharge Plan and Services Expected Discharge Plan: [place] (LTAC) In-house Referral: NA Discharge Planning Services: CM Consult Post Acute Care Choice: [place] arrangements for the past 2 months: Single Family Home Expected Discharge Date: 09/21/19                                     Social Determinants of Health (SDOH) Interventions    Readmission Risk Interventions Readmission Risk Prevention Plan 08/08/2019  Transportation Screening Complete  PCP or Specialist Appt within 3-5 Days Complete  HRI or Home Care Consult Complete  Medication Review (RN Care Manager) Complete  Some recent data might be hidden

## 2019-09-24 NOTE — TOC Transition Note (Addendum)
Transition of Care [facility]) - CM/SW Discharge Note   Patient Details  Name: Jaclyn Day MRN: [id number] Date of Birth: [birth year]  Transition of Care [facility]) CM/SW Contact:  Bary Castilla, LCSW Phone Number: [phone] 09/24/2019, 2:29 PM   Clinical Narrative:     Patient will DC to:?[facility] Anticipated DC date:?09/24/19 Family notified:?[name] Transport [id number]   Per MD patient ready for DC to [facility]?. RN, patient, patient's family, and facility notified of DC. Discharge Summary sent to facility. RN given number for report [phone] ask for Morene Antu. DC packet on chart. Ambulance transport requested for patient.   CSW signing off.   [place], DISH [phone]   Final next level of care: Skilled Nursing Facility Barriers to Discharge: No Barriers Identified   Patient Goals and CMS Choice   CMS Medicare.gov Compare Post Acute Care list provided to:: Patient Choice offered to / list presented to : Patient  Discharge Placement              Patient [facility] bed at: [facility] Patient to be transferred to facility by: [place] Name of family member notified: [name] Patient and family notified of of transfer: 09/24/19  Discharge Plan and Services In-house Referral: NA Discharge Planning Services: CM Consult Post Acute Care Choice: [place]                               Social Determinants of Health (SDOH) Interventions     Readmission Risk Interventions Readmission Risk Prevention Plan 08/08/2019  Transportation Screening Complete  PCP or Specialist Appt within 3-5 Days Complete  HRI or Home Care Consult Complete  Medication Review (RN Care Manager) Complete  Some recent data might be hidden
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
## 2019-09-24 NOTE — Progress Notes (Signed)
ANTICOAGULATION CONSULT NOTE - Follow-Up Consult  Pharmacy Consult for Warfarin Indication: atrial fibrillation  Allergies  Allergen Reactions  . Codeine Other (See Comments)    Reaction not recalled by family- was told to never take this    Patient Measurements: Height: 5\' 6"  (167.6 cm) Weight: 163 lb 5.8 oz (74.1 kg) IBW/kg (Calculated) : 59.3  Vital Signs: Temp: 97.9 F (36.6 C) (12/13 0803) Temp Source: Oral (12/13 0803) BP: 109/68 (12/13 0949) Pulse Rate: 84 (12/13 0949)  Labs: Recent Labs    09/21/19 1107 09/22/19 0135 09/23/19 0345 09/24/19 0317  HGB 7.9*  --   --   --   HCT 26.2*  --   --   --   PLT 293  --   --   --   LABPROT  --  24.5* 25.4* 25.6*  INR  --  2.2* 2.3* 2.3*  CREATININE 3.79*  --   --   --     Estimated Creatinine Clearance: 13.8 mL/min (A) (by C-G formula based on SCr of 3.79 mg/dL (H)).   Medical History: Past Medical History:  Diagnosis Date  . Anemia   . Chronic diastolic CHF (congestive heart failure) ([place])   . CKD (chronic kidney disease), stage IV ([place])   . Hypertension   . Persistent atrial fibrillation ([place])    a. dx 01/2019 s/p TEE DCCV.  Marland Kitchen Renal disorder   . Unilateral congenital absence of kidney    Assessment: Pt is a 73 yr old female admitted s/p out of hospital cardiac arrest and TTM. Admit c/b by encephalopathy, AKI which progressed to HD, and ?embolic CVA. Pt on apixaban PTA for Afib, which was held for IV heparin. Heparin subsequently held on 11/16 due to bleeding from HD lines and low Hgb. Warfarin started 11/18 with no bridge planned. Patient on amiodarone PO.   INR today continues to be therapeutic at 2.3. No new CBC (last on 12/10 stable)   Goal of Therapy:  INR 2-3 Monitor platelets by anticoagulation protocol: Yes   Plan:  Warfarin 3 mg po x 1 tonight. Discharge regimen: warfarin 3 mg daily with close INR follow-up Daily INR   Thank you,   Eddie Candle, PharmD PGY-1 Pharmacy Resident   Please  check amion for clinical pharmacist contact number

## 2019-09-24 NOTE — Plan of Care (Signed)
  Problem: Education: Goal: Knowledge of General Education information will improve Description: Including pain rating scale, medication(s)/side effects and non-pharmacologic comfort measures Outcome: Progressing   Problem: Clinical Measurements: Goal: Ability to maintain clinical measurements within normal limits will improve Outcome: Progressing Goal: Diagnostic test results will improve Outcome: Progressing   Problem: Nutrition: Goal: Adequate nutrition will be maintained Outcome: Progressing   Problem: Skin Integrity: Goal: Risk for impaired skin integrity will decrease Outcome: Progressing   Problem: Activity: Goal: Ability to tolerate increased activity will improve Outcome: Progressing   Problem: Education: Goal: Knowledge of disease or condition will improve Outcome: Progressing Goal: Knowledge of secondary prevention will improve Outcome: Progressing   Problem: Nutrition: Goal: Risk of aspiration will decrease Outcome: Progressing Goal: Dietary intake will improve Outcome: Progressing   Problem: Safety: Goal: Ability to remain free from injury will improve Outcome: Completed/Met   Problem: Respiratory: Goal: Ability to maintain a clear airway and adequate ventilation will improve Outcome: Completed/Met   Problem: Role Relationship: Goal: Method of communication will improve Outcome: Completed/Met   Problem: Coping: Goal: Will verbalize positive feelings about self Outcome: Completed/Met Goal: Will identify appropriate support needs Outcome: Completed/Met

## 2019-09-24 NOTE — Progress Notes (Signed)
Report given to a nurse at [facility].  Idolina Primer, RN

## 2019-09-24 NOTE — Progress Notes (Signed)
Patient ID: Jaclyn Day, female   DOB: May 25, 1946, 73 y.o.   MRN: [id number]  [facility] Progress Note    Assessment/ Plan:   1. Acute kidney Injury on chronic kidney disease stage IV-now likely ESRD: Secondary to ATN following cardiac arrest and MSSA bacteremia.  On TTS hemodialysis via tunneled catheter as permanent access was previously precluded by her MSSA bacteremia/sepsis (with plan for outpatient referral to vascular surgery for permanent access as overall deconditioning improves).  She tolerated hemodialysis in a recliner yesterday and appears to be tolerating sitting for prolonged time in her room which bodes well for her ability to tolerate outpatient dialysis.  Unfortunately, not able to go to SNF yesterday and will likely go today or tomorrow (will confirm outpatient dialysis details with Terri Piedra tomorrow).  She had some hypotension with dialysis yesterday and is likely at her dry weight. 2.  MSSA bacteremia/sepsis: S/P completion of intravenous Ancef, source suspected to be from cellulitis of the lower extremities. 3.  Status post cardiac arrest: With significant deconditioning during his hospitalization.  Anticipate continued PT/OT at SNF. 4.  History of paroxysmal atrial fibrillation: Rate controlled, remains on amiodarone with anticoagulation on warfarin. 5.  Protein calorie malnutrition/deconditioning following acute illness: Anticipate prolonged rehabilitation course and plans noted for discharge to skilled nursing facility. Continue OP HD.   Subjective:   Unfortunately not able to discharge to SNF yesterday due to breakdown of communication/inability to reach facility accepting her.  Efforts underway to try and get her out today or tomorrow.   Objective:   BP 109/68   Pulse 84   Temp 97.9 F (36.6 C) (Oral)   Resp 17   Ht 5\' 6"  (1.676 m)   Wt 74.1 kg   SpO2 100%   BMI 26.37 kg/m   Intake/Output Summary (Last 24 hours) at 09/24/2019 1027 Last  data filed at 09/24/2019 0951 Gross per 24 hour  Intake 792 ml  Output 500 ml  Net 292 ml   Weight change: 1 kg  Physical Exam: Gen: Comfortably sitting in bed, boyfriend at bedside CVS: Pulse irregularly irregular- normal rate, S1 and S2 normal Resp: Transmitted breath sounds bilaterally, no distinct rales or rhonchi. RIJ TDC Abd: Soft, obese, nontender Ext: Minimal palpable lower extremity edema  Imaging: No results found.  Labs: BMET Recent Labs  Lab 09/18/19 0336 09/19/19 0808 09/21/19 1107  NA 138 138 137  K 4.5 4.6 4.5  CL 99 98 99  CO2 25 24 26   GLUCOSE 67* 112* 82  BUN 65* 88* 46*  CREATININE 3.28* 4.32* 3.79*  CALCIUM 9.6 9.3 9.3  PHOS 6.3* 6.0* 6.3*   CBC Recent Labs  Lab 09/19/19 0355 09/21/19 1107  WBC 10.6* 9.2  HGB 8.4* 7.9*  HCT 27.5* 26.2*  MCV 93.2 94.9  PLT 259 293    Medications:    .  stroke: mapping our early stages of recovery book   Does not apply Once  . amiodarone  200 mg Oral Daily  . ARIPiprazole  10 mg Oral BID  . atorvastatin  20 mg Oral Daily  . Chlorhexidine Gluconate Cloth  6 each Topical Q0600  . coumadin book   Does not apply Once  . darbepoetin (ARANESP) injection - DIALYSIS  100 mcg Intravenous Q Sat-HD  . feeding supplement  1 Container Oral TID BM  . feeding supplement (NEPRO CARB STEADY)  237 mL Oral BID BM  . feeding supplement (PRO-STAT SUGAR FREE 64)  30 mL Oral BID  .  ferrous sulfate  325 mg Oral Daily  . insulin aspart  0-9 Units Subcutaneous Q4H  . mouth rinse  15 mL Mouth Rinse BID  . metoprolol tartrate  12.5 mg Oral BID  . multivitamin  1 tablet Oral QHS  . pantoprazole  40 mg Oral Daily  . sodium chloride flush  3 mL Intravenous Q12H  . thiamine  100 mg Oral Daily  . valproic acid  250 mg Oral Q6H  . Warfarin - Pharmacist Dosing Inpatient   Does not apply [id number]   Elmarie Shiley, MD 09/24/2019, 10:27 AM

## 2019-09-24 NOTE — Progress Notes (Signed)
Attempted to give a report to [place]. No answer.  Idolina Primer, RN

## 2019-09-25 ENCOUNTER — Non-Acute Institutional Stay (SKILLED_NURSING_FACILITY): Payer: 59 | Admitting: Adult Health

## 2019-09-25 ENCOUNTER — Encounter: Payer: Self-pay | Admitting: Adult Health

## 2019-09-25 DIAGNOSIS — I4891 Unspecified atrial fibrillation: Secondary | ICD-10-CM

## 2019-09-25 DIAGNOSIS — R7881 Bacteremia: Secondary | ICD-10-CM

## 2019-09-25 DIAGNOSIS — D62 Acute posthemorrhagic anemia: Secondary | ICD-10-CM

## 2019-09-25 DIAGNOSIS — E785 Hyperlipidemia, unspecified: Secondary | ICD-10-CM

## 2019-09-25 DIAGNOSIS — B9561 Methicillin susceptible Staphylococcus aureus infection as the cause of diseases classified elsewhere: Secondary | ICD-10-CM

## 2019-09-25 DIAGNOSIS — N184 Chronic kidney disease, stage 4 (severe): Secondary | ICD-10-CM

## 2019-09-25 DIAGNOSIS — F259 Schizoaffective disorder, unspecified: Secondary | ICD-10-CM

## 2019-09-25 DIAGNOSIS — I5021 Acute systolic (congestive) heart failure: Secondary | ICD-10-CM

## 2019-09-25 DIAGNOSIS — I6319 Cerebral infarction due to embolism of other precerebral artery: Secondary | ICD-10-CM

## 2019-09-25 DIAGNOSIS — N179 Acute kidney failure, unspecified: Secondary | ICD-10-CM | POA: Diagnosis not present

## 2019-09-25 NOTE — Progress Notes (Signed)
Location:  Financial controller of Service:  SNF (31) Provider:  Durenda Age, DNP, FNP-BC  Patient Care Team: Fae Pippin as PCP - General (Physician Assistant) Dorothy Spark, MD as PCP - Cardiology (Cardiology)  Extended Emergency Contact Information Primary Emergency Contact: Araceli Bouche [place] of [place] Phone: [phone] Mobile Phone: [phone] Relation: Significant other Secondary Emergency Contact: Bush,Beth Address: [place] of [place] Phone: [phone] Mobile Phone: [phone] Relation: Sister  Code Status:  Full Code  Goals of care: Advanced Directive information Advanced Directives 09/25/2019  Does Patient Have a Medical Advance Directive? Yes  Type of Advance Directive (No Data)  Does patient want to make changes to medical advance directive? No - Patient declined     Chief Complaint  Patient presents with  . Acute Visit    Hospitalization follow-up    HPI:  Pt is a 73 y.o. female who w post hospitalization as admitted admitted to [place] on 09/24/19 post hospitalization 08/03/19 to 09/24/19. She has PMH of congenital unilateral kidney, CKD-4, hypertension, persistent atrial fibrillation on Eliquis, diastolic CHF, depression, anxiety, schizoaffective disorder and developmental disorder.  She was reportedly found down by family with LKN 30 minutes prior.  CPR was started by fire department responders, required 10 minutes of CPR and 1 round of Epi before ROSC, admitted to ICU 08/03/2019 after cardiac arrest.  She was intubated on arrival with transient hypotension and post intubation requiring short course of pressors, found to have new systolic CHF with an EF of 20 to 25%, global hypokinesis.  Blood culture from 08/03/27 grew MSSA for which she was started on vancomycin and transitioned to Ancef on 08/06/2019 which was  completed on 09/18/2019 per ID recommendations.  Succeeding blood cultures have been negative and since 08/07/2019.  Echocardiogram on 08/21/2019 showed EF of 55 to 60%.  Hospital course complicated by A. fib with RVR for which cardiology was consulted and was switched from Eliquis to IV heparin as well as required IV amiodarone which was subsequently switched to oral Coumadin and oral amiodarone respectively.  Acute metabolic encephalopathy, agitation and AKI secondary to ATN.  She was a started on CRRT on 08/11/2019 which was continued until 08/13/2019 and was subsequently transition to IHD and right IJ TDC was placed on 08/23/2019.  Neurology was consulted due to altered persistent mental status on 08/19/2019.  MRI of the brain showed several small acute infarcts in the white matter of the superior frontal lobes and left occipital lobe.  No hemorrhage.  Several small subacute bilateral cerebellar infarcts.  Due to several comorbidities and now on hemodialysis, cardiology decided to put her on Coumadin for anticoagulation instead of Eliquis which she was taking prior to admission.  She had few episodes of hypotension for which she was started on midodrine but subsequently her blood pressure improved so midodrine was discontinued.  EEG was unremarkable.  She was on Depakote prior to admission but was on hold then restarted by neurology.  Also, she had acute blood loss anemia presumed to be bleeding.  She was initially started on tube feeding due to significant dysphagia.  Subsequently NG was discontinued and was started on dysphagia diet then advanced since then.  She had oral thrush which was treated by nystatin. Despite being on amiodarone, her atrial fibrillation had slightly elevated heart rate for which she was started on metoprolol  12.5 mg orally twice a day and since then her heart rate has remained fairly controlled.  She had extended hospitalization of 52 days. The initial plan was to discharge to [facility],  however she was declined since she was not a candidate for that.  She was initially discharged by hospitalist service on 09/21/2019.  However, nephrology wanted to try hemodialysis in a recliner which she tolerated on 09/23/2019.  Case manager/social worker was able to get a hold of admission director at [facility] today, 09/25/2019, and was finally discharged.   Past Medical History:  Diagnosis Date  . Anemia   . Chronic diastolic CHF (congestive heart failure) ([place])   . CKD (chronic kidney disease), stage IV ([place])   . Hypertension   . Persistent atrial fibrillation ([place])    a. dx 01/2019 s/p TEE DCCV.  Marland Kitchen Renal disorder   . Unilateral congenital absence of kidney    Past Surgical History:  Procedure Laterality Date  . CARDIOVERSION N/A 01/31/2019   Procedure: CARDIOVERSION;  Surgeon: Lelon Perla, MD;  Location: [facility] ENDOSCOPY;  Service: Cardiovascular;  Laterality: N/A;  . IR FLUORO GUIDE CV LINE RIGHT  08/23/2019  . IR US GUIDE VASC ACCESS RIGHT  08/23/2019  . TEE WITHOUT CARDIOVERSION N/A 01/31/2019   Procedure: TRANSESOPHAGEAL ECHOCARDIOGRAM (TEE);  Surgeon: Lelon Perla, MD;  Location: [facility] ENDOSCOPY;  Service: Cardiovascular;  Laterality: N/A;    Allergies  Allergen Reactions  . Codeine Other (See Comments)    Reaction not recalled by family- was told to never take this    Outpatient Encounter Medications as of 09/25/2019  Medication Sig  . acetaminophen (TYLENOL) 500 MG tablet Take 1,000 mg by mouth every 8 (eight) hours as needed for mild pain or headache.   Marland Kitchen amiodarone (PACERONE) 200 MG tablet Take 1 tablet (200 mg total) by mouth daily.  . ARIPiprazole (ABILIFY) 10 MG tablet Take 10 mg by mouth 2 (two) times daily.   Marland Kitchen atorvastatin (LIPITOR) 20 MG tablet Take 20 mg by mouth daily.  . bisacodyl (DULCOLAX) 10 MG suppository If not relieved by MOM, give 10 mg Bisacodyl suppositiory rectally X 1 dose in 24 hours as needed (Do not use constipation  standing orders for residents with renal failure/CFR less than 30. Contact MD for orders) (Physician Order)  . brimonidine (ALPHAGAN) 0.15 % ophthalmic solution Place 1 drop into both eyes 2 (two) times daily.   . dorzolamide-timolol (COSOPT) 22.3-6.8 MG/ML ophthalmic solution Place 1 drop into both eyes 2 (two) times daily.  . ferrous sulfate 325 (65 FE) MG tablet Take 1 tablet (325 mg total) by mouth daily.  . metoprolol tartrate (LOPRESSOR) 25 MG tablet Take 0.5 tablets (12.5 mg total) by mouth 2 (two) times daily.  . NON FORMULARY 1500 cc Fluid Restriction  . NON FORMULARY Med Pass 120 ml po twice a day for malnutrition  . NON FORMULARY Heart healthy diet  . Sodium Phosphates (RA SALINE ENEMA RE) If not relieved by Biscodyl suppository, give disposable Saline Enema rectally X 1 dose/24 hrs as needed (Do not use constipation standing orders for residents with renal failure/CFR less than 30. Contact MD for orders)(Physician Or  . valproic acid (DEPAKENE) 250 MG capsule Take 1 capsule (250 mg total) by mouth every 6 (six) hours.  Marland Kitchen warfarin (COUMADIN) 3 MG tablet Take 1 tablet (3 mg total) by mouth daily.   No facility-administered encounter medications on file as of 09/25/2019.    Review of Systems  GENERAL:  No change in appetite, no fatigue, no weight changes, no fever, chills or weakness MOUTH and THROAT: Denies oral discomfort, gingival pain or bleeding RESPIRATORY: no cough, SOB, DOE, wheezing, hemoptysis CARDIAC: No chest pain, edema or palpitations GI: No abdominal pain, diarrhea, constipation, heart burn, nausea or vomiting NEUROLOGICAL: Denies dizziness, syncope, numbness, or headache PSYCHIATRIC: Denies feelings of depression or anxiety. No report of hallucinations, insomnia, paranoia, or agitation   Immunization History  Administered Date(s) Administered  . Influenza-Unspecified 08/12/2013   Pertinent  Health Maintenance Due  Topic Date Due  . URINE MICROALBUMIN   12/11/1956  . COLONOSCOPY  12/11/1996  . MAMMOGRAM  05/09/2011  . DEXA SCAN  12/12/2011  . PNA vac Low Risk Adult (1 of 2 - PCV13) 12/12/2011  . INFLUENZA VACCINE  05/13/2019   No flowsheet data found.   Vitals:   09/25/19 1302  BP: 129/76  Pulse: 90  Resp: 20  Temp: 98.4 F (36.9 C)  TempSrc: Oral  SpO2: 95%  Weight: 163 lb 5.8 oz (74.1 kg)  Height: 5\' 6"  (1.676 m)   Body mass index is 26.37 kg/m.  Physical Exam  GENERAL APPEARANCE: Well nourished. In no acute distress. Normal body habitus SKIN:  Small skin tear on left elbow MOUTH and THROAT: Lips are without lesions. Oral mucosa is moist and without lesions.  RESPIRATORY: Breathing is even & unlabored, BS CTAB CARDIAC: Irregular rhythm, no murmur,no extra heart sounds, no edema GI: Abdomen soft, normal BS, no masses, no tenderness EXTREMITIES:  Able to move X 4 extremities NEUROLOGICAL: There is no tremor. Speech is clear. Alert and oriented X 3. PSYCHIATRIC:  Affect and behavior are appropriate  Labs reviewed: Recent Labs    09/18/19 0336 09/19/19 0355 09/19/19 0808 09/20/19 0249 09/21/19 0444 09/21/19 1107  NA 138  --  138  --   --  137  K 4.5  --  4.6  --   --  4.5  CL 99  --  98  --   --  99  CO2 25  --  24  --   --  26  GLUCOSE 67*  --  112*  --   --  82  BUN 65*  --  88*  --   --  46*  CREATININE 3.28*  --  4.32*  --   --  3.79*  CALCIUM 9.6  --  9.3  --   --  9.3  MG 2.1 1.9  --  1.9 2.1  --   PHOS 6.3*  --  6.0*  --   --  6.3*   Recent Labs    08/03/19 1013 08/15/19 1130 08/18/19 1931 09/18/19 0336 09/19/19 0808 09/21/19 1107  AST 26 16 10*  --   --   --   ALT 20 <5 <5  --   --   --   ALKPHOS 94 97 91  --   --   --   BILITOT 1.2 0.7 0.6  --   --   --   PROT 4.8* 5.5* 5.1*  --   --   --   ALBUMIN 2.3* 1.9* 1.7* 1.9* 1.7* 1.9*   Recent Labs    08/09/19 0327 08/10/19 0437 08/11/19 0730 09/10/19 0337 09/17/19 0431 09/19/19 0355 09/21/19 1107  WBC 13.8* 18.9*  --  12.9* 10.0 10.6*  9.2  NEUTROABS 10.4* 14.6*  --  10.4*  --   --   --   HGB 8.0* 8.5*  --  8.9* 8.3* 8.4*  7.9*  HCT 25.6* 27.2*   < > 29.0* 27.0* 27.5* 26.2*  MCV 92.1 92.5  --  91.2 91.8 93.2 94.9  PLT 140* 182  --  264 239 259 293   < > = values in this interval not displayed.   Lab Results  Component Value Date   TSH 3.554 08/15/2019   Lab Results  Component Value Date   HGBA1C 5.9 (H) 08/20/2019   Lab Results  Component Value Date   CHOL 92 08/20/2019   HDL 26 (L) 08/20/2019   LDLCALC 50 08/20/2019   TRIG 79 08/20/2019   CHOLHDL 3.5 08/20/2019    Significant Diagnostic Results in last 30 days:  DG CHEST PORT 1 VIEW  Result Date: 08/31/2019 CLINICAL DATA:  73 year old female with leukocytosis. EXAM: PORTABLE CHEST 1 VIEW COMPARISON:  Chest radiograph dated 08/21/2019. FINDINGS: Right-sided dialysis catheter with tip in similar position. Feeding tube extends below the diaphragm with tip beyond the inferior margin of the image. No significant interval change in the bilateral pleural effusions, right greater left and associated bilateral lower lobe atelectasis versus infiltrate. No pneumothorax. Stable cardiac silhouette. Atherosclerotic calcification of the aorta. Multiple mildly displaced left rib fractures. IMPRESSION: 1. No significant interval change in the bilateral pleural effusions and associated bilateral lower lobe atelectasis versus infiltrate. 2. Multiple mildly displaced left rib fractures. No identifiable pneumothorax. Electronically Signed   By: Anner Crete M.D.   On: 08/31/2019 09:11   KUB  Result Date: 09/09/2019 CLINICAL DATA:  Feeding tube clogged EXAM: PORTABLE ABDOMEN - 1 VIEW COMPARISON:  08/13/2019 FINDINGS: Feeding tube is in place. The tip is in the distal stomach or proximal duodenum. No visible kink within the visualized feeding tube. Nonobstructive bowel gas pattern. IMPRESSION: Feeding tube tip in the distal stomach or proximal duodenum. Electronically Signed   By:  Rolm Baptise M.D.   On: 09/09/2019 19:52    Assessment/Plan  1. MSSA bacteremia - S/P intravenous Ancef, source suspected to be from cellulitis of the lower extremities, now resolved  2. History of of cardiac arrest -With significant deconditioning, for PT and OT for therapeutic strengthening exercises, fall precautions  3. Atrial fibrillation with RVR (HCC) -Continue Coumadin, Pacerone and metoprolol tartrate -Follow-up with cardiology on 12/15//20  4. Acute renal failure superimposed on stage 4 chronic kidney disease, unspecified acute renal failure type ([place]) Lab Results  Component Value Date   CREATININE 3.79 (H) 09/21/2019  - likely ESRD secondary to ATN following cardiac arrest and MSSA bacteremia, currently getting hemodialysis via right IJ on TThS schedule, hemodialysis in recliner along with transfers -1500 cc fluid restriction  5. Anemia associated with acute blood loss Lab Results  Component Value Date   HGB 7.9 (L) 09/21/2019  -Thought to be multifactorial, acute blood loss from HD, chronic disease and malnutrition -Continue ferrous sulfate 325 mg 1 tab daily  6. Hyperlipidemia, unspecified hyperlipidemia type Lab Results  Component Value Date   CHOL 92 08/20/2019   HDL 26 (L) 08/20/2019   LDLCALC 50 08/20/2019   TRIG 79 08/20/2019   CHOLHDL 3.5 08/20/2019  -Continue Lipitor 20 mg 1 tab daily  7. Schizoaffective disorder, unspecified type ([place]) -Continue Depakote and Abilify, psych consult with psych NP  8. Cerebral infarction due to embolism of other precerebral artery (HCC) -Likely embolic CVA in the setting of cardiac arrest and arrhythmia, continue Coumadin and Lipitor    Family/staff Communication:  Discussed plan of care with resident and charge nurse.  Labs/tests ordered:  None  Goals of care:   Short-term care   Durenda Age, DNP, FNP-BC [facility] [phone] (Monday-Friday 8:00 a.m. - 5:00  p.m.) [phone] (after hours)

## 2019-09-26 ENCOUNTER — Ambulatory Visit: Payer: 59 | Admitting: Physician Assistant

## 2019-09-26 ENCOUNTER — Inpatient Hospital Stay: Payer: 59 | Admitting: Internal Medicine

## 2019-09-26 ENCOUNTER — Non-Acute Institutional Stay (SKILLED_NURSING_FACILITY): Payer: 59 | Admitting: Internal Medicine

## 2019-09-26 ENCOUNTER — Encounter: Payer: Self-pay | Admitting: Internal Medicine

## 2019-09-26 DIAGNOSIS — N184 Chronic kidney disease, stage 4 (severe): Secondary | ICD-10-CM

## 2019-09-26 DIAGNOSIS — E785 Hyperlipidemia, unspecified: Secondary | ICD-10-CM | POA: Insufficient documentation

## 2019-09-26 DIAGNOSIS — N179 Acute kidney failure, unspecified: Secondary | ICD-10-CM

## 2019-09-26 DIAGNOSIS — I6319 Cerebral infarction due to embolism of other precerebral artery: Secondary | ICD-10-CM | POA: Diagnosis not present

## 2019-09-26 DIAGNOSIS — I5021 Acute systolic (congestive) heart failure: Secondary | ICD-10-CM

## 2019-09-26 DIAGNOSIS — R7881 Bacteremia: Secondary | ICD-10-CM | POA: Diagnosis not present

## 2019-09-26 DIAGNOSIS — D62 Acute posthemorrhagic anemia: Secondary | ICD-10-CM | POA: Insufficient documentation

## 2019-09-26 DIAGNOSIS — B9561 Methicillin susceptible Staphylococcus aureus infection as the cause of diseases classified elsewhere: Secondary | ICD-10-CM

## 2019-09-26 DIAGNOSIS — I4819 Other persistent atrial fibrillation: Secondary | ICD-10-CM

## 2019-09-26 DIAGNOSIS — F259 Schizoaffective disorder, unspecified: Secondary | ICD-10-CM | POA: Insufficient documentation

## 2019-09-26 NOTE — Assessment & Plan Note (Signed)
Rate adequately controlled on present regimen of beta-blocker and amiodarone.  Warfarin will be continued.

## 2019-09-26 NOTE — Progress Notes (Signed)
Cardiology Office Note   Date:  09/26/2019   ID:  STEFANNY WINKOWSKI, DOB 29-Jan-1946, MRN [id number]  PCP:  Cyndi Bender, PA-C  Cardiologist: Dr. Meda Coffee, MD   No chief complaint on file.   History of Present Illness: Jaclyn Day is a 73 y.o. female who presents for hospital follow up, seen for Dr. Meda Coffee. She has a hx of A.fib on Eliquis (dx 01/2019) s/p TEE/DCCV, diastolic heart failure, HTN, HLD, IDDM, CKD 4 w/ hx of nephrectomy and most recently was hospitalized for cardiac arrest.   She was transported to [facility] on 08/03/19 after a witnessed cardiac arrest. Reportedly found down by family for approximately 30 minutes. CPR initiated for by first responder which required 10 minutes of CPR and 1 round of epi before ROSC, admitted to ICU 08/03/2019. Patient was intubated on arrival with transient hypotension requiring short course of vasopressors found to have a new systolic failure with an LVEF of 20 to 25% with global hypokinesis. Blood cultures on presentation found to be positive for MSSA for which she underwent TEE for suspected endocarditis and was placed on vancomycin which was then transitioned to Ancef completed on 09/18/2019 per ID recommendations with several repeat blood cultures being negative. Repeat echocardiogram on 08/21/2019 showed improved LVEF at 55 to 60% with moderate LAE. Initial cardiac plans were for coronary CTA or conventional angiography once renal function was stabilized (with HD) however, per chart review, Dr. Meda Coffee prefered medical management in the setting of poor overall health and functional status with normal LVEF and no symptoms of chest pain or shortness of breath.     Hospital course was complicated by atrial fibrillation with RVR for which cardiology was consulted.  Her Eliquis was transitioned to IV heparin and subsequently to p.o. warfarin by discharge due to the need for HD.  HR was maintained on IV amiodarone which was ultimately transitioned to p.o.  dosing by discharge with the addition of metoprolol 12.5 mg p.o. twice daily.  She also had complications with acute metabolic encephalopathy and agitation, AKI secondary to ATN in which she was started on CRRT for greater than 1 week.  Due to persistent altered mental status, neurology was consulted 08/19/19 and the patient underwent MRI of the brain which showed several small acute infarcts in white matter of the superior frontal lobes and left occipital lob with no evidence of hemorrhage. EEG per neurology was unremarkable.    Initial plan was for discharge to LTAC however the patient declined and she was found not to be a candidate.  Subsequently, PT recommended skilled nursing facility however, both patient and family were rather resistant given Covid restrictions.  Therefore, case managers were able to find a facility with liberal visitation policy and the patient and family were in agreement.    Today she presents alone in a wheelchair.  She reports that she was discharged from the hospital to [facility] for rehabilitation.  Reports that she has stood with the assistance of PT however will be working on walking independently this week.  She is able to feed herself on her own.  Reports she has lost approximately 40 pounds during her hospitalization.   She is doing very well given everything that she has been through recently. She is in good spirits although would prefer to be living in [place] with her boyfriend. Tolerating HD well.  [facility] per [facility], last checked 09/24/2019 at 2.3.  Denies bleeding in stool or urine.  BP is slightly  low today however patient denies dizziness or other presyncopal symptoms.  Denies palpitations, chest pain, shortness of breath or LE swelling.  Is tolerating amiodarone and metoprolol with a stable heart rate at 82 bpm today.   Past Medical History:  Diagnosis Date  . Anemia   . Chronic diastolic CHF (congestive heart failure) ([place])   . CKD (chronic kidney disease),  stage IV ([place])   . Hypertension   . Persistent atrial fibrillation ([place])    a. dx 01/2019 s/p TEE DCCV.  Marland Kitchen Renal disorder   . Unilateral congenital absence of kidney     Past Surgical History:  Procedure Laterality Date  . CARDIOVERSION N/A 01/31/2019   Procedure: CARDIOVERSION;  Surgeon: Lelon Perla, MD;  Location: [facility] ENDOSCOPY;  Service: Cardiovascular;  Laterality: N/A;  . IR FLUORO GUIDE CV LINE RIGHT  08/23/2019  . IR US GUIDE VASC ACCESS RIGHT  08/23/2019  . TEE WITHOUT CARDIOVERSION N/A 01/31/2019   Procedure: TRANSESOPHAGEAL ECHOCARDIOGRAM (TEE);  Surgeon: Lelon Perla, MD;  Location: [facility] ENDOSCOPY;  Service: Cardiovascular;  Laterality: N/A;     Current Outpatient Medications  Medication Sig Dispense Refill  . acetaminophen (TYLENOL) 500 MG tablet Take 1,000 mg by mouth every 8 (eight) hours as needed for mild pain or headache.     . Amino Acids-Protein Hydrolys (FEEDING SUPPLEMENT, PRO-STAT SUGAR FREE 64,) LIQD Take 30 mLs by mouth daily.    Marland Kitchen amiodarone (PACERONE) 200 MG tablet Take 1 tablet (200 mg total) by mouth daily. 30 tablet 0  . ARIPiprazole (ABILIFY) 10 MG tablet Take 10 mg by mouth 2 (two) times daily.     Marland Kitchen atorvastatin (LIPITOR) 20 MG tablet Take 20 mg by mouth daily.    . bisacodyl (DULCOLAX) 10 MG suppository If not relieved by MOM, give 10 mg Bisacodyl suppositiory rectally X 1 dose in 24 hours as needed (Do not use constipation standing orders for residents with renal failure/CFR less than 30. Contact MD for orders) (Physician Order)    . brimonidine (ALPHAGAN) 0.15 % ophthalmic solution Place 1 drop into both eyes 2 (two) times daily.     . dorzolamide-timolol (COSOPT) 22.3-6.8 MG/ML ophthalmic solution Place 1 drop into both eyes 2 (two) times daily.    . ferrous sulfate 325 (65 FE) MG tablet Take 1 tablet (325 mg total) by mouth daily. 30 tablet 0  . metoprolol tartrate (LOPRESSOR) 25 MG tablet Take 0.5 tablets (12.5 mg total) by mouth 2 (two) times  daily. 30 tablet 0  . NON FORMULARY 1500 cc Fluid Restriction    . NON FORMULARY Heart healthy diet    . Nutritional Supplements (FEEDING SUPPLEMENT, NEPRO CARB STEADY,) LIQD Take 237 mLs by mouth 2 (two) times daily.    . Sodium Phosphates (RA SALINE ENEMA RE) If not relieved by Biscodyl suppository, give disposable Saline Enema rectally X 1 dose/24 hrs as needed (Do not use constipation standing orders for residents with renal failure/CFR less than 30. Contact MD for orders)(Physician Or    . valproic acid (DEPAKENE) 250 MG capsule Take 1 capsule (250 mg total) by mouth every 6 (six) hours. 120 capsule 0  . warfarin (COUMADIN) 3 MG tablet Take 1 tablet (3 mg total) by mouth daily. 30 tablet 0   No current facility-administered medications for this visit.    Allergies:   Codeine    Social History:  The patient  reports that she has never smoked. She has never used smokeless tobacco. She reports previous alcohol use.  She reports previous drug use.   Family History:  The patient's family history includes Atrial fibrillation in her brother and father.    ROS:  Please see the history of present illness.  Otherwise, review of systems are positive for none.  All other systems are reviewed and negative.    PHYSICAL EXAM: VS:  There were no vitals taken for this visit. , BMI There is no height or weight on file to calculate BMI.   General: NAD Neck: No JVD Lungs:Clear to ausculation bilaterally. No wheezes. Breathing is unlabored. Cardiovascular: RRR with S1 S2. No murmurs Extremities: No edema. DP pulses 2+ bilaterally Neuro: Alert and oriented. No focal deficits. MAE spontaneously, weeak in upper and lowers. Psych: Responds to questions appropriately with normal affect.     EKG:  EKG is not ordered today.  Recent Labs: 01/25/2019: B Natriuretic Peptide 1,574.7 08/15/2019: TSH 3.554 08/18/2019: ALT <5 09/21/2019: BUN 46; Creatinine, Ser 3.79; Hemoglobin 7.9; Magnesium 2.1; Platelets  293; Potassium 4.5; Sodium 137   Lipid Panel    Component Value Date/Time   CHOL 92 08/20/2019 0523   TRIG 79 08/20/2019 0523   HDL 26 (L) 08/20/2019 0523   CHOLHDL 3.5 08/20/2019 0523   VLDL 16 08/20/2019 0523   LDLCALC 50 08/20/2019 0523     Wt Readings from Last 3 Encounters:  09/26/19 165 lb 12.8 oz (75.2 kg)  09/25/19 163 lb 5.8 oz (74.1 kg)  09/24/19 163 lb 5.8 oz (74.1 kg)     Other studies Reviewed: Additional studies/ records that were reviewed today include:   TTE 08/03/19 1. Left ventricular ejection fraction, by visual estimation, is 20 to 25%. The left ventricle has severely decreased function. Normal left ventricular size. There is moderately increased left ventricular hypertrophy. Severe global hypokinesis. 2. Left ventricular diastolic function could not be evaluated pattern of LV diastolic filling. 3. Global right ventricle has mildly reduced systolic function.The right ventricular size is normal. No increase in right ventricular wall thickness. 4. Left atrial size was severely dilated. 5. Right atrial size was normal. 6. The mitral valve is abnormal. Mild mitral valve regurgitation. 7. The tricuspid valve is grossly normal. Tricuspid valve regurgitation is trivial. 8. The aortic valve is tricuspid Aortic valve regurgitation was not visualized by color flow Doppler. 9. The pulmonic valve was grossly normal. Pulmonic valve regurgitation is not visualized by color flow Doppler. 10. Normal pulmonary artery systolic pressure. 11. The inferior vena cava is normal in size with <50% respiratory variability, suggesting right atrial pressure of 8 mmHg. (on vent)   Echocardiogram 08/21/19 Impressions: 1. Left ventricular ejection fraction, by visual estimation, is 55 to 60%. The left ventricle has normal function. There is moderately increased left ventricular hypertrophy. 2. Left ventricular diastolic parameters are indeterminate. 3. Global right ventricle  has normal systolic function.The right ventricular size is normal. No increase in right ventricular wall thickness. 4. Left atrial size was moderately dilated. 5. Right atrial size was mildly dilated. 6. Trivial pericardial effusion is present. 7. The mitral valve is normal in structure. Trace mitral valve regurgitation. 8. The tricuspid valve is normal in structure. Tricuspid valve regurgitation is mild. 9. The aortic valve is tricuspid. Aortic valve regurgitation is not visualized. 10. The pulmonic valve was not well visualized. Pulmonic valve regurgitation is not visualized. 11. The tricuspid regurgitant velocity is 2.79 m/s, and with an assumed right atrial pressure of 8 mmHg, the estimated right ventricular systolic pressure is mildly elevated at 39.1 mmHg. 12. The inferior vena  cava is normal in size with <50% respiratory variability, suggesting right atrial pressure of 8 mmHg. 13. The interatrial septum was not well visualized.  ASSESSMENT AND PLAN:  1. Atrial Fibrillation with RVR: -Reasonably rate controlled with amiodarone PO>>200mg  PO QD -Metoprolol 12.5mg  PO BID  -Continue with Coumadin (was on Eliquis>>>transitioned in the setting of new HD) -Last INR 2.3 on 09/24/2019  2. S/p cardiac arrest of unknown etiology: -Patient initially presented with out of hospital cardiac arrest s/p cooling protocol. LHC initially deferred due to renal function and in an attempt to avoid HD>repeat echo with stabilized EF and no cardiac symptoms.  Per chart review, Dr. Meda Coffee prefers medical management secondary to poor overall health and functional status in the absence of symptoms. -EF initially down to 20-25% on 08/03/2019 but improved to 55-60% on repeat Echo on 08/21/2019. -Denies CP or other CV symptoms   3. Acute on chronic combined CHF: -Initial LVEF down to 20 to 25% on 08/03/2019 however improved on repeat echocardiogram to 55 to 60% by 08/21/2019  -Volume management per  HD -Appears euvolemic on exam today   4. MSSA Bacteremia/Endocarditis: -TEE on 08/09/2019 showed small filamentous mass on the aortic side of the left coronary cusp most consistent with vegetation given clinical picture of staph bacteremia. -Completed course of Ancef per ID 09/18/2019 -Follows with Dr. Linus Salmons   5. ESRD: -Started on hemodialysis during last hospital admission -Management per Nephrology.    Current medicines are reviewed at length with the patient today.  The patient does not have concerns regarding medicines.  The following changes have been made:  no change  Labs/ tests ordered today include: CMET, TSH  No orders of the defined types were placed in this encounter.    Disposition:   FU with Dr. Meda Coffee in 6 weeks  Signed, Kathyrn Drown, NP  09/26/2019 11:10 AM    [address], [place], [place]  [id number] Phone: [phone]; Fax: [phone]

## 2019-09-26 NOTE — Patient Instructions (Signed)
See assessment and plan under each diagnosis in the problem list and acutely for this visit 

## 2019-09-26 NOTE — Assessment & Plan Note (Signed)
HD continuing

## 2019-09-26 NOTE — Progress Notes (Signed)
NURSING HOME LOCATION:  [facility] ROOM NUMBER:  [id number]  CODE STATUS:  DNR  PCP:  Cyndi Bender, PA-C  [place]  [id number]  This is a comprehensive admission note to [facility] performed on this date less than 30 days from date of admission. Included are preadmission medical/surgical history; reconciled medication list; family history; social history and comprehensive review of systems.  Corrections and additions to the records were documented. Comprehensive physical exam was also performed. Additionally a clinical summary was entered for each active diagnosis pertinent to this admission in the Problem List to enhance continuity of care.  HPI: The patient was hospitalized 10/22-12/13/2020 (52 days total)having been found down by the family 30 minutes after last being seen.  EMS initiated CPR; after 10 minutes of CPR and 1 round of epi patient responded and was transported.  Patient was admitted to the ICU following the cardiac arrest.  Patient was intubated on arrival ; this was associated with transient hypotension requiring short course of pressors.Midodrine also was initiated for hypotension transiently. She was found to have new diastolic congestive heart failure with ejection fraction of 20-25% and global hypokinesis. Blood cultures drawn 10/22 revealed MSSA; vancomycin was initiated and transitioned to Ancef after 3 days.  Total course of Ancef was completed as of 12/12.  Subsequent blood cultures were negative.   Follow-up ECHO on 11/9 revealed an ejection fraction of 55-60%, moderate LAE and RVSP 39. Hospital course was complicated by A. fib with RVR.  Cardiology was switched from Eliquis to IV heparin and IV amiodarone was initiated.  Subsequently she was switched to oral Coumadin and oral amiodarone. Acute metabolic encephalopathy with associated agitation and AKI were attributed to ATN.   On 10/30 CRRT was initiated and continued until 11/1 at which  time she was transitioned to IHD and right IJ TDC was placed. Neurology consulted because of persistent altered mental status: MRI revealed several small acute infarcts in the white matter of the superior frontal lobes and left occipital lobe without hemorrhage.  She also had several small subacute bilateral cerebellar infarcts. This complex history was in the context of CKD 4 for which dialysis was initiated as well as hypertension, and congenital unilateral kidney. Prior to hospitalization she had been on Depakote but this was on hold during hospitalization.  Neurology initiated valproic acid.  Initially tube feedings were started because of dysphagia in the setting of the strokes; subsequently she was switched to a dysphagia diet with advancement as tolerated. She did develop oral thrush treated with nystatin. Despite the antibiotic course, she continues have leukocytosis but remained afebrile.  This did subsequently resolve. Despite amiodarone, heart rate remained elevated and metoprolol 12.5 mg was initiated twice daily. Course was also complicated by pressure injury &  acute blood loss anemia. PT/OT recommended SNF placement.  Past medical and surgical history: Includes essential hypertension, dyslipidemia, glaucoma, depression/anxiety/schizoaffective disorder and developmental disorder. Surgeries and procedures include cardioversion.  Social history: Nondrinker, never smoked.She has significant other ("my boyfriend,but I don't live with him!")  Family history: Reviewed; both her father and brother had A. fib.   Review of systems: Date given as "Dec12 or 13/2020".She thought that she had been in the hospital for only 10 -11  days, not 52.  She validates that she had a stroke. She is using her left upper extremity more than the right due to persistent "swelling and stiffness" in the right upper extremity.  She is right-handed.  She describes some cough  with sputum almost daily.  This is not  associated with dyspnea, chest pain, or fever. She validates she is being treated for depression.  She states that she gets "scared" when she has to hold onto a second individual for balance. She states she has been told she snores but there is no history of sleep apnea.  Constitutional: No fever, significant weight change, fatigue  Eyes: No redness, discharge, pain, vision change ENT/mouth: No nasal congestion, purulent discharge, earache, change in hearing, sore throat  Cardiovascular: No anginal chest pain, palpitations, paroxysmal nocturnal dyspnea, claudication  Respiratory: No hemoptysis, DOE, significant snoring, apnea  Gastrointestinal: No heartburn, dysphagia, abdominal pain, nausea /vomiting, rectal bleeding, melena, change in bowels Genitourinary: No dysuria, hematuria, pyuria, incontinence, nocturia Dermatologic: No rash, pruritus, change in appearance of skin Neurologic: No dizziness, headache, syncope, seizures, numbness, tingling Psychiatric: No insomnia, anorexia Endocrine: No change in hair/skin/nails, excessive thirst, excessive hunger, excessive urination  Hematologic/lymphatic: No significant bruising, lymphadenopathy, abnormal bleeding Allergy/immunology: No itchy/watery eyes, significant sneezing, urticaria, angioedema  Physical exam:  Pertinent or positive findings: Affect is flat.  She is surprisingly alert and communicative however in the context of her complicated history and prolonged hospitalization.  She has minimal ptosis on the left.  Proptosis is suggested bilaterally.  The left nasolabial fold is decreased.  She had just eaten and food was dripping from the left lower lip laterally.  She is not wearing her upper dental plate.  Heart rhythm is irregular but rate is controlled.  She has minimal rhonchi and exhibited a nonproductive cough intermittently.  1/2+ edema is present at the sock line.  Dorsalis pedis pulses are stronger than the posterior tibial pulses.   Left upper and left lower extremities are weaker than the right upper extremity and right lower extremity.  The lower extremities are weaker than the upper extremities in comparison.  She has a flexion contracture of the fifth right finger.  She exhibits fine tremor of the left hand.  Scarring and bruising is noted over the forearms, right greater than left.  There is a benign polypoid nevus at the base of the nose.  General appearance: Adequately nourished; no acute distress, increased work of breathing is present.   Lymphatic: No lymphadenopathy about the head, neck, axilla. Eyes: No conjunctival inflammation or lid edema is present. There is no scleral icterus. Ears:  External ear exam shows no significant lesions or deformities.   Nose:  External nasal examination shows no deformity or inflammation. Nasal mucosa are pink and moist without lesions, exudates Oral exam: Lips and gums are healthy appearing. Neck:  No thyromegaly, masses, tenderness noted.    Heart:  No gallop, murmur, click, rub.  Lungs: without wheezes,rubs. Abdomen: Bowel sounds are normal.  Abdomen is soft and nontender with no organomegaly, hernias, masses. GU: Deferred  Extremities:  No cyanosis, clubbing. Neurologic exam:  Balance, Rhomberg, finger to nose testing could not be completed due to clinical state Skin: Warm & dry w/o tenting. No significant  rash.  See clinical summary under each active problem in the Problem List with associated updated therapeutic plan

## 2019-09-26 NOTE — Assessment & Plan Note (Deleted)
Status post full course of  Ancef after transition from vancomycin

## 2019-09-28 NOTE — Assessment & Plan Note (Signed)
She remains afebrile.  She does have a cough which is productive of some yellow sputum.  She may benefit from neb treatments.  Imaging will be pursued if symptoms persist.

## 2019-09-28 NOTE — Assessment & Plan Note (Signed)
Despite these significant findings, she is surprisingly alert and communicative on exam. BIMS screening mental status will be completed.  Intensive mental status testing would be deferred to her PCP as indicated clinically following discharge from the SNF. PT/OT at the SNF will continue.

## 2019-09-28 NOTE — Assessment & Plan Note (Signed)
Clinically compensated on exam; minor edema

## 2019-10-09 ENCOUNTER — Encounter: Payer: Self-pay | Admitting: Cardiology

## 2019-10-09 ENCOUNTER — Ambulatory Visit (INDEPENDENT_AMBULATORY_CARE_PROVIDER_SITE_OTHER): Payer: 59 | Admitting: Cardiology

## 2019-10-09 ENCOUNTER — Other Ambulatory Visit: Payer: Self-pay

## 2019-10-09 VITALS — BP 98/58 | HR 82 | Ht 66.0 in | Wt 205.0 lb

## 2019-10-09 DIAGNOSIS — I4891 Unspecified atrial fibrillation: Secondary | ICD-10-CM

## 2019-10-09 DIAGNOSIS — I6319 Cerebral infarction due to embolism of other precerebral artery: Secondary | ICD-10-CM

## 2019-10-09 DIAGNOSIS — Z79899 Other long term (current) drug therapy: Secondary | ICD-10-CM | POA: Diagnosis not present

## 2019-10-09 DIAGNOSIS — I5032 Chronic diastolic (congestive) heart failure: Secondary | ICD-10-CM

## 2019-10-09 DIAGNOSIS — I4819 Other persistent atrial fibrillation: Secondary | ICD-10-CM

## 2019-10-09 DIAGNOSIS — I5021 Acute systolic (congestive) heart failure: Secondary | ICD-10-CM

## 2019-10-09 DIAGNOSIS — I10 Essential (primary) hypertension: Secondary | ICD-10-CM

## 2019-10-09 DIAGNOSIS — I330 Acute and subacute infective endocarditis: Secondary | ICD-10-CM

## 2019-10-09 LAB — COMPREHENSIVE METABOLIC PANEL
ALT: 4 IU/L (ref 0–32)
AST: 12 IU/L (ref 0–40)
Albumin/Globulin Ratio: 1.2 (ref 1.2–2.2)
Albumin: 3.1 g/dL — ABNORMAL LOW (ref 3.7–4.7)
Alkaline Phosphatase: 77 IU/L (ref 39–117)
BUN/Creatinine Ratio: 5 — ABNORMAL LOW (ref 12–28)
BUN: 16 mg/dL (ref 8–27)
Bilirubin Total: 0.2 mg/dL (ref 0.0–1.2)
CO2: 29 mmol/L (ref 20–29)
Calcium: 9.1 mg/dL (ref 8.7–10.3)
Chloride: 93 mmol/L — ABNORMAL LOW (ref 96–106)
Creatinine, Ser: 2.97 mg/dL — ABNORMAL HIGH (ref 0.57–1.00)
GFR calc Af Amer: 17 mL/min/{1.73_m2} — ABNORMAL LOW (ref >59)
GFR calc non Af Amer: 15 mL/min/{1.73_m2} — ABNORMAL LOW (ref >59)
Globulin, Total: 2.6 g/dL (ref 1.5–4.5)
Glucose: 78 mg/dL (ref 65–99)
Potassium: 4.3 mmol/L (ref 3.5–5.2)
Sodium: 137 mmol/L (ref 134–144)
Total Protein: 5.7 g/dL — ABNORMAL LOW (ref 6.0–8.5)

## 2019-10-09 LAB — TSH: TSH: 9.73 u[IU]/mL — ABNORMAL HIGH (ref 0.450–4.500)

## 2019-10-09 NOTE — Patient Instructions (Signed)
Medication Instructions:  Your physician recommends that you continue on your current medications as directed. Please refer to the Current Medication list given to you today.  *If you need a refill on your cardiac medications before your next appointment, please call your pharmacy*  Lab Work:  You will have labs drawn today: CMET and TSH  If you have labs (blood work) drawn today and your tests are completely normal, you will receive your results only by: Marland Kitchen MyChart Message (if you have MyChart) OR . A paper copy in the mail If you have any lab test that is abnormal or we need to change your treatment, we will call you to review the results.  Testing/Procedures:  None ordered today  Follow-Up: At [facility], you and your health needs are our priority.  As part of our continuing mission to provide you with exceptional heart care, we have created designated Provider Care Teams.  These Care Teams include your primary Cardiologist (physician) and Advanced Practice Providers (APPs -  Physician Assistants and Nurse Practitioners) who all work together to provide you with the care you need, when you need it.  Your next appointment:   8 week(s)  The format for your next appointment:   In Person  Provider:   Ena Dawley, MD

## 2019-10-16 ENCOUNTER — Telehealth: Payer: Self-pay

## 2019-10-16 ENCOUNTER — Non-Acute Institutional Stay (SKILLED_NURSING_FACILITY): Payer: 59 | Admitting: Adult Health

## 2019-10-16 ENCOUNTER — Encounter: Payer: Self-pay | Admitting: Adult Health

## 2019-10-16 DIAGNOSIS — F259 Schizoaffective disorder, unspecified: Secondary | ICD-10-CM

## 2019-10-16 DIAGNOSIS — R7881 Bacteremia: Secondary | ICD-10-CM | POA: Diagnosis not present

## 2019-10-16 DIAGNOSIS — N184 Chronic kidney disease, stage 4 (severe): Secondary | ICD-10-CM

## 2019-10-16 DIAGNOSIS — N179 Acute kidney failure, unspecified: Secondary | ICD-10-CM

## 2019-10-16 DIAGNOSIS — D62 Acute posthemorrhagic anemia: Secondary | ICD-10-CM

## 2019-10-16 DIAGNOSIS — Z8674 Personal history of sudden cardiac arrest: Secondary | ICD-10-CM

## 2019-10-16 DIAGNOSIS — I4891 Unspecified atrial fibrillation: Secondary | ICD-10-CM

## 2019-10-16 DIAGNOSIS — E785 Hyperlipidemia, unspecified: Secondary | ICD-10-CM

## 2019-10-16 DIAGNOSIS — I6319 Cerebral infarction due to embolism of other precerebral artery: Secondary | ICD-10-CM

## 2019-10-16 DIAGNOSIS — B9561 Methicillin susceptible Staphylococcus aureus infection as the cause of diseases classified elsewhere: Secondary | ICD-10-CM

## 2019-10-16 NOTE — Progress Notes (Signed)
Location:  [place] Room Number: 111/A Place of Service:  SNF (31) Provider:  Durenda Age, DNP, FNP-BC  Patient Care Team: Fae Pippin as PCP - General (Physician Assistant) Dorothy Spark, MD as PCP - Cardiology (Cardiology)  Extended Emergency Contact Information Primary Emergency Contact: Araceli Bouche [place] of [place] Phone: [phone] Mobile Phone: [phone] Relation: Significant other Secondary Emergency Contact: Bush,Beth Address: [address] Johnnette Litter of [place] Phone: [phone] Mobile Phone: [phone] Relation: Sister  Code Status:  DNR  Goals of care: Advanced Directive information Advanced Directives 10/16/2019  Does Patient Have a Medical Advance Directive? Yes  Type of Advance Directive Out of facility DNR (pink MOST or yellow form)  Does patient want to make changes to medical advance directive? No - Patient declined  Pre-existing out of facility DNR order (yellow form or pink MOST form) Yellow form placed in chart (order not valid for inpatient use)     Chief Complaint  Patient presents with  . Discharge Note    Discharge Visit    HPI:  Pt is a 74 y.o. female who is for discharge home with Home health PT, OT and Nursing for medication management.Patient was admitted to this facility for short-term rehabilitation after the patient's recent hospitalization.  Patient has completed SNF rehabilitation and therapy has cleared the patient for discharge.  She was admitted to [place] on 09/24/19 post hospitalization 08/03/2019 to 10/11/2019.  She has PMH of congenital unilateral kidney, CKD-4, hypertension, persistent atrial fibrillation on Eliquis, diastolic CHF, depression, anxiety, schizoaffective active disorder and developmental disorder.  She was reportedly found down by family.  Last known normal 30 minutes prior.   CPR was a started by fire department responders, required 10 minutes of CPR and 1 round of epi before ROSC, admitted to ICU 08/03/2019 after cardiac arrest.  She was intubated on arrival with transient hypotension and post intubation requiring short course of pressors, found to have new systolic CHF with an EF of 20 to 25%, global hypokinesis.  Blood culture from 08/03/2019 grew MSSA for which she was started on vancomycin and transitioned to Ancef on 08/06/2019 which was completed on 09/18/2019 per ID recommendations.  Succeeding blood cultures have been negative since 08/07/2019.  Echocardiogram on 08/21/2019 showed EF of 55 to 60%.  Hospital course complicated by atrial fibrillation with RVR for which cardiology was consulted and was switched from Eliquis to IV Heparin as well as required IV amiodarone which was subsequently switched to oral Coumadin and oral amiodarone respectively.  She also had complications such as acute metabolic encephalopathy, agitation and AKI secondary to ATN.  She was also started on CRRT on 08/11/2019 which was continued until 08/13/2019 and was subsequently transitioned to IHD and right IJ TDC was placed on 08/23/2019.  Neurology was consulted due to altered persistent mental status on 08/19/2019.  MRI of the brain showed several small acute infarcts in the white matter of superior frontal lobes and left occipital lobe. No hemorrhage. Several small subacute bilateral cerebellar infarcts. Due to several comorbidities and now on hemodialysis, cardiology decided to put her on Coumadin for anticoagulation instead of Eliquis which she was taking prior to admission. She had few episodes of hypotension for which she was started on Midodrine but subsequently her BP improved so Midodrine was discontinued. EEG was unremarkable. She was on Depakote prior to admission but was  on hold then restarted by neurology. Also, she had acute blood loss anemia presumed to be bleeding. She was initially started  on tube feeding due to significant dysphagia. Subsequently NG was discontinued and was started on dysphagia diet then advanced since then. She had oral thrush which was treated with Nystatin. Despite being on Amiodarone, her atrial fibrillation had slightly elevated heart rate for which she was started on Metoprolol 12.5 mg orally twice a day and since then her heart rate has remained fairly controlled. She has extended hospitalization of 52 days. The initial plan was to discharge to [facility], however she was declined since she was not a candidate for that.     Past Medical History:  Diagnosis Date  . Anemia   . Chronic diastolic CHF (congestive heart failure) ([place])   . CKD (chronic kidney disease), stage IV ([place])   . Hypertension   . Persistent atrial fibrillation ([place])    a. dx 01/2019 s/p TEE DCCV.  Marland Kitchen Renal disorder   . Unilateral congenital absence of kidney    Past Surgical History:  Procedure Laterality Date  . CARDIOVERSION N/A 01/31/2019   Procedure: CARDIOVERSION;  Surgeon: Lelon Perla, MD;  Location: [facility] ENDOSCOPY;  Service: Cardiovascular;  Laterality: N/A;  . IR FLUORO GUIDE CV LINE RIGHT  08/23/2019  . IR US GUIDE VASC ACCESS RIGHT  08/23/2019  . TEE WITHOUT CARDIOVERSION N/A 01/31/2019   Procedure: TRANSESOPHAGEAL ECHOCARDIOGRAM (TEE);  Surgeon: Lelon Perla, MD;  Location: [facility] ENDOSCOPY;  Service: Cardiovascular;  Laterality: N/A;    Allergies  Allergen Reactions  . Codeine Other (See Comments)    Reaction not recalled by family- was told to never take this    Outpatient Encounter Medications as of 10/16/2019  Medication Sig  . acetaminophen (TYLENOL) 500 MG tablet Take 1,000 mg by mouth every 8 (eight) hours as needed for mild pain or headache.   . Amino Acids-Protein Hydrolys (FEEDING SUPPLEMENT, PRO-STAT SUGAR FREE 64,) LIQD Take 30 mLs by mouth daily. For malnutrition  . amiodarone (PACERONE) 200 MG tablet Take 1 tablet (200 mg total) by mouth daily.  .  ARIPiprazole (ABILIFY) 10 MG tablet Take 10 mg by mouth 2 (two) times daily.   Marland Kitchen atorvastatin (LIPITOR) 20 MG tablet Take 20 mg by mouth daily.  . bisacodyl (DULCOLAX) 10 MG suppository If not relieved by MOM, give 10 mg Bisacodyl suppositiory rectally X 1 dose in 24 hours as needed (Do not use constipation standing orders for residents with renal failure/CFR less than 30. Contact MD for orders) (Physician Order)  . brimonidine (ALPHAGAN) 0.15 % ophthalmic solution Place 1 drop into both eyes 2 (two) times daily.   . dorzolamide-timolol (COSOPT) 22.3-6.8 MG/ML ophthalmic solution Place 1 drop into both eyes 2 (two) times daily.  . ferrous sulfate 325 (65 FE) MG tablet Take 1 tablet (325 mg total) by mouth daily.  . metoprolol succinate (TOPROL-XL) 25 MG 24 hr tablet Give half tab (12.5mg ) by mouth in evenings after dialysis for HTN  . metoprolol tartrate (LOPRESSOR) 25 MG tablet Take 0.5 tablets (12.5 mg total) by mouth 2 (two) times daily.  . NON FORMULARY 1500 cc Fluid Restriction  . Nutritional Supplements (FEEDING SUPPLEMENT, NEPRO CARB STEADY,) LIQD NEPRO CARB STEADY LIQUID DRINK I CAN BY MOUTH TWICE A DAY FOR HEALTH MAINTENANCE  . Sodium Phosphates (RA SALINE ENEMA RE) If not relieved by Biscodyl suppository, give disposable Saline Enema rectally X 1 dose/24 hrs as needed (Do not use constipation standing orders  for residents with renal failure/CFR less than 30. Contact MD for orders)(Physician Or  . valproic acid (DEPAKENE) 250 MG capsule Take 1 capsule (250 mg total) by mouth every 6 (six) hours.  Marland Kitchen warfarin (COUMADIN) 2 MG tablet Take 2 mg by mouth daily.  . [DISCONTINUED] NON FORMULARY   . [DISCONTINUED] warfarin (COUMADIN) 3 MG tablet Take 1 tablet (3 mg total) by mouth daily.   No facility-administered encounter medications on file as of 10/16/2019.    Review of Systems  GENERAL: No change in appetite, no fatigue, no weight changes, no fever, chills or weakness MOUTH and THROAT:  Denies oral discomfort, gingival pain or bleeding   RESPIRATORY: no cough, SOB, DOE, wheezing, hemoptysis CARDIAC: No chest pain, edema or palpitations GI: No abdominal pain, diarrhea, constipation, heart burn, nausea or vomiting GU: Denies dysuria, frequency, hematuria or discharge NEUROLOGICAL: Denies dizziness, syncope, numbness, or headache PSYCHIATRIC: Denies feelings of depression or anxiety. No report of hallucinations, insomnia, paranoia, or agitation   Immunization History  Administered Date(s) Administered  . Influenza-Unspecified 08/12/2013  . Zoster Recombinat (Shingrix) 12/11/2018   Pertinent  Health Maintenance Due  Topic Date Due  . URINE MICROALBUMIN  12/11/1956  . COLONOSCOPY  12/11/1996  . MAMMOGRAM  05/09/2011  . DEXA SCAN  12/12/2011  . PNA vac Low Risk Adult (1 of 2 - PCV13) 12/12/2011  . INFLUENZA VACCINE  05/13/2019   No flowsheet data found.   Vitals:   10/16/19 1347  BP: 120/60  Pulse: 86  Resp: 20  Temp: 98.1 F (36.7 C)  TempSrc: Oral  SpO2: 97%  Weight: 165 lb 3.5 oz (74.9 kg)  Height: 5\' 6"  (1.676 m)   Body mass index is 26.67 kg/m.  Physical Exam  GENERAL APPEARANCE: Well nourished. In no acute distress. Normal body habitus SKIN:  Skin is warm and dry.  MOUTH and THROAT: Lips are without lesions. Oral mucosa is moist and without lesions. Tongue is normal in shape, size, and color and without lesions RESPIRATORY: Breathing is even & unlabored, BS CTAB CARDIAC: Irregular heart rhythm, no murmur,no extra heart sounds, no edema. Right chest with hemodialysis catheter GI: Abdomen soft, normal BS, no masses, no tenderness EXTREMITIES:  Able to move X 4 extremities NEUROLOGICAL: There is no tremor. Speech is clear. Left-sided weakness. Alert to self and place, disoriented to time. PSYCHIATRIC: Affect and behavior are appropriate  Labs reviewed: Recent Labs    09/18/19 0336 09/19/19 0355 09/19/19 0808 09/20/19 0249 09/21/19 0444  09/21/19 1107 10/09/19 0843  NA 138  --  138  --   --  137 137  K 4.5  --  4.6  --   --  4.5 4.3  CL 99  --  98  --   --  99 93*  CO2 25  --  24  --   --  26 29  GLUCOSE 67*  --  112*  --   --  82 78  BUN 65*  --  88*  --   --  46* 16  CREATININE 3.28*  --  4.32*  --   --  3.79* 2.97*  CALCIUM 9.6  --  9.3  --   --  9.3 9.1  MG 2.1 1.9  --  1.9 2.1  --   --   PHOS 6.3*  --  6.0*  --   --  6.3*  --    Recent Labs    08/15/19 1130 08/18/19 1931 09/19/19 [id number] 09/21/19 1107 10/09/19 [id number]  AST 16 10*  --   --  12  ALT <5 <5  --   --  4  ALKPHOS 97 91  --   --  77  BILITOT 0.7 0.6  --   --  0.2  PROT 5.5* 5.1*  --   --  5.7*  ALBUMIN 1.9* 1.7* 1.7* 1.9* 3.1*   Recent Labs    08/09/19 0327 08/10/19 0437 08/11/19 0730 09/10/19 0337 09/17/19 0431 09/19/19 0355 09/21/19 1107  WBC 13.8* 18.9*  --  12.9* 10.0 10.6* 9.2  NEUTROABS 10.4* 14.6*  --  10.4*  --   --   --   HGB 8.0* 8.5*  --  8.9* 8.3* 8.4* 7.9*  HCT 25.6* 27.2*   < > 29.0* 27.0* 27.5* 26.2*  MCV 92.1 92.5  --  91.2 91.8 93.2 94.9  PLT 140* 182  --  264 239 259 293   < > = values in this interval not displayed.   Lab Results  Component Value Date   TSH 9.730 (H) 10/09/2019   Lab Results  Component Value Date   HGBA1C 5.9 (H) 08/20/2019   Lab Results  Component Value Date   CHOL 92 08/20/2019   HDL 26 (L) 08/20/2019   LDLCALC 50 08/20/2019   TRIG 79 08/20/2019   CHOLHDL 3.5 08/20/2019     Assessment/Plan  1. MSSA bacteremia - S/P intravenous Ancef, source suspected to be from cellulitis of the lower extremities, now resoved  2. History of cardiac arrest - had in-house short-term rehabilitation and now whill have Home health PT and OT for therapeutic strengthening exercises  3. Atrial fibrillation with RVR (HCC) - amiodarone (PACERONE) 200 MG tablet; Take 1 tablet (200 mg total) by mouth daily.  Dispense: 30 tablet; Refill: 0 - metoprolol tartrate (LOPRESSOR) 25 MG tablet; Take 0.5 tablets (12.5  mg total) by mouth 2 (two) times daily.  Dispense: 60 tablet; Refill: 0 - warfarin (COUMADIN) 2 MG tablet; Take 1 tablet (2 mg total) by mouth daily.  Dispense: 30 tablet; Refill: 0  4. Acute renal failure superimposed on stage 4 chronic kidney disease, unspecified acute renal failure type ([place]) - likely ESRD secondary to ATN following cardiac arrest and MSSA bactermia, currently getting hemodialysis via right IJ on TThS, 1500 cc fluid restriction  5. Anemia associated with acute blood loss - ferrous sulfate 325 (65 FE) MG tablet; Take 1 tablet (325 mg total) by mouth daily.  Dispense: 30 tablet; Refill: 0  6. Hyperlipidemia, unspecified hyperlipidemia type - atorvastatin (LIPITOR) 20 MG tablet; Take 1 tablet (20 mg total) by mouth daily.  Dispense: 30 tablet; Refill: 0  7. Schizoaffective disorder, unspecified type (HCC) - ARIPiprazole (ABILIFY) 10 MG tablet; Take 1 tablet (10 mg total) by mouth 2 (two) times daily.  Dispense: 60 tablet; Refill: 0 - valproic acid (DEPAKENE) 250 MG capsule; Take 1 capsule (250 mg total) by mouth every 6 (six) hours.  Dispense: 120 capsule; Refill: 0  8. Cerebral infarction due to embolism of other precerebral artery (HCC) - likely embolic CVA in the setting of cardiac arrest and arrythmia -Has left-sided weaknesss - atorvastatin (LIPITOR) 20 MG tablet; Take 1 tablet (20 mg total) by mouth daily.  Dispense: 30 tablet; Refill: 0 - warfarin (COUMADIN) 2 MG tablet; Take 1 tablet (2 mg total) by mouth daily.  Dispense: 30 tablet; Refill: 0     I have filled out patient's discharge paperwork and e-prescribed medications.  Patient will receive home health PT, OT and  Nurse.  DME provided:  Stormy Fabian and semi-electric hospital bed  Due to left hemiparesis following cerebral infarction, patient requires frequent changes in body position and/or has immediate need for a change in body position. She requires positioning of her body in ways not feasible with an  ordinary bed.   Total discharge time: Greater than 30 minutes Greater than 50% was spent in counseling and coordination of care.   Discharge time involved coordination of the discharge process with social worker, nursing staff and therapy department. Medical justification for home health services/DME verified.   Durenda Age, DNP, FNP-BC [facility] [phone] (Monday-Friday 8:00 a.m. - 5:00 p.m.) [phone] (after hours)

## 2019-10-16 NOTE — Telephone Encounter (Signed)
-----   Message from Nuala Alpha, LPN sent at [phone]  8:37 AM EST ----- Regarding: FW: 6-8 week follow up You can add her to 11/22/19 end slot at 1020. Put ok per Ivy and KN.  Thanks, Karlene Einstein  ----- Message ----- From: Mady Haagensen, CMA Sent: 10/09/2019   8:34 AM EST To: Nuala Alpha, LPN Subject: 6-8 week follow up                             Sharee Pimple would like this patient to see Dr. Meda Coffee in 6-8 weeks, she is here but schedule is full. I didn't know if there was a cancellation list we could put her on?

## 2019-10-16 NOTE — Telephone Encounter (Signed)
I called and spoke with patients significant other [name] Stutts. He states she should be coming home from [place] on Wednesday. I gave Konrad Dolores the appointment information, and told him I will reach back out Friday afternoon to see if patient can come to the appointment on 11/22/19 at 10:20 with Dr. Meda Coffee.

## 2019-10-17 MED ORDER — BRIMONIDINE TARTRATE 0.15 % OP SOLN: 1.0000 [drp] | Freq: Two times a day (BID) | OPHTHALMIC | 0 refills | Status: AC

## 2019-10-17 MED ORDER — PRO-STAT SUGAR FREE PO LIQD: 30.0000 mL | Freq: Every day | ORAL | 0 refills | Status: AC

## 2019-10-17 MED ORDER — AMIODARONE HCL 200 MG PO TABS: 200.0000 mg | ORAL_TABLET | Freq: Every day | ORAL | 0 refills | Status: AC

## 2019-10-17 MED ORDER — METOPROLOL TARTRATE 25 MG PO TABS: 12.5000 mg | ORAL_TABLET | Freq: Two times a day (BID) | ORAL | 0 refills | Status: AC

## 2019-10-17 MED ORDER — DORZOLAMIDE HCL-TIMOLOL MAL 2-0.5 % OP SOLN: 1.0000 [drp] | Freq: Two times a day (BID) | OPHTHALMIC | 0 refills | Status: AC

## 2019-10-17 MED ORDER — VALPROIC ACID 250 MG PO CAPS: 250.0000 mg | ORAL_CAPSULE | Freq: Four times a day (QID) | ORAL | 0 refills | Status: AC

## 2019-10-17 MED ORDER — ATORVASTATIN CALCIUM 20 MG PO TABS: 20.0000 mg | ORAL_TABLET | Freq: Every day | ORAL | 0 refills | Status: AC

## 2019-10-17 MED ORDER — FERROUS SULFATE 325 (65 FE) MG PO TABS: 325.0000 mg | ORAL_TABLET | Freq: Every day | ORAL | 0 refills | Status: AC

## 2019-10-17 MED ORDER — WARFARIN SODIUM 2 MG PO TABS: 2.0000 mg | ORAL_TABLET | Freq: Every day | ORAL | 0 refills | Status: AC

## 2019-10-17 MED ORDER — ARIPIPRAZOLE 10 MG PO TABS: 10.0000 mg | ORAL_TABLET | Freq: Two times a day (BID) | ORAL | 0 refills | Status: AC

## 2019-10-27 NOTE — Telephone Encounter (Signed)
I called and spoke with patient and significant other [name], telehealth virtual visit made for 11/22/19 at 2:40PM. Appointment was made as a telehealth appointment due to patient being immobile at this time. She was discharged from [facility] last week and is still unable to walk.

## 2019-11-06 ENCOUNTER — Encounter (HOSPITAL_COMMUNITY): Payer: Self-pay | Admitting: Emergency Medicine

## 2019-11-06 ENCOUNTER — Inpatient Hospital Stay (HOSPITAL_COMMUNITY)
Admission: EM | Admit: 2019-11-06 | Discharge: 2019-11-13 | DRG: 682 | Disposition: E | Payer: 59 | Attending: Internal Medicine | Admitting: Internal Medicine

## 2019-11-06 ENCOUNTER — Emergency Department (HOSPITAL_COMMUNITY): Payer: 59

## 2019-11-06 ENCOUNTER — Other Ambulatory Visit: Payer: Self-pay

## 2019-11-06 DIAGNOSIS — R6250 Unspecified lack of expected normal physiological development in childhood: Secondary | ICD-10-CM | POA: Diagnosis present

## 2019-11-06 DIAGNOSIS — L89890 Pressure ulcer of other site, unstageable: Secondary | ICD-10-CM | POA: Diagnosis present

## 2019-11-06 DIAGNOSIS — Z79899 Other long term (current) drug therapy: Secondary | ICD-10-CM

## 2019-11-06 DIAGNOSIS — L89622 Pressure ulcer of left heel, stage 2: Secondary | ICD-10-CM | POA: Diagnosis present

## 2019-11-06 DIAGNOSIS — B9561 Methicillin susceptible Staphylococcus aureus infection as the cause of diseases classified elsewhere: Secondary | ICD-10-CM | POA: Diagnosis present

## 2019-11-06 DIAGNOSIS — F418 Other specified anxiety disorders: Secondary | ICD-10-CM | POA: Diagnosis present

## 2019-11-06 DIAGNOSIS — N3000 Acute cystitis without hematuria: Secondary | ICD-10-CM | POA: Diagnosis present

## 2019-11-06 DIAGNOSIS — L89312 Pressure ulcer of right buttock, stage 2: Secondary | ICD-10-CM | POA: Diagnosis present

## 2019-11-06 DIAGNOSIS — Z8619 Personal history of other infectious and parasitic diseases: Secondary | ICD-10-CM

## 2019-11-06 DIAGNOSIS — D696 Thrombocytopenia, unspecified: Secondary | ICD-10-CM | POA: Diagnosis present

## 2019-11-06 DIAGNOSIS — F419 Anxiety disorder, unspecified: Secondary | ICD-10-CM | POA: Diagnosis present

## 2019-11-06 DIAGNOSIS — J90 Pleural effusion, not elsewhere classified: Secondary | ICD-10-CM

## 2019-11-06 DIAGNOSIS — F259 Schizoaffective disorder, unspecified: Secondary | ICD-10-CM | POA: Diagnosis present

## 2019-11-06 DIAGNOSIS — Q600 Renal agenesis, unilateral: Secondary | ICD-10-CM

## 2019-11-06 DIAGNOSIS — I5032 Chronic diastolic (congestive) heart failure: Secondary | ICD-10-CM | POA: Diagnosis present

## 2019-11-06 DIAGNOSIS — Z7401 Bed confinement status: Secondary | ICD-10-CM

## 2019-11-06 DIAGNOSIS — E785 Hyperlipidemia, unspecified: Secondary | ICD-10-CM | POA: Diagnosis present

## 2019-11-06 DIAGNOSIS — H409 Unspecified glaucoma: Secondary | ICD-10-CM | POA: Diagnosis present

## 2019-11-06 DIAGNOSIS — F39 Unspecified mood [affective] disorder: Secondary | ICD-10-CM | POA: Diagnosis present

## 2019-11-06 DIAGNOSIS — Y95 Nosocomial condition: Secondary | ICD-10-CM | POA: Diagnosis present

## 2019-11-06 DIAGNOSIS — Z7901 Long term (current) use of anticoagulants: Secondary | ICD-10-CM

## 2019-11-06 DIAGNOSIS — R627 Adult failure to thrive: Secondary | ICD-10-CM

## 2019-11-06 DIAGNOSIS — I959 Hypotension, unspecified: Secondary | ICD-10-CM | POA: Diagnosis present

## 2019-11-06 DIAGNOSIS — Z91199 Patient's noncompliance with other medical treatment and regimen due to unspecified reason: Secondary | ICD-10-CM

## 2019-11-06 DIAGNOSIS — D631 Anemia in chronic kidney disease: Secondary | ICD-10-CM | POA: Diagnosis present

## 2019-11-06 DIAGNOSIS — Z9115 Patient's noncompliance with renal dialysis: Secondary | ICD-10-CM | POA: Diagnosis not present

## 2019-11-06 DIAGNOSIS — R05 Cough: Secondary | ICD-10-CM

## 2019-11-06 DIAGNOSIS — Z20822 Contact with and (suspected) exposure to covid-19: Secondary | ICD-10-CM | POA: Diagnosis present

## 2019-11-06 DIAGNOSIS — N186 End stage renal disease: Secondary | ICD-10-CM | POA: Diagnosis present

## 2019-11-06 DIAGNOSIS — Z9119 Patient's noncompliance with other medical treatment and regimen: Secondary | ICD-10-CM

## 2019-11-06 DIAGNOSIS — I509 Heart failure, unspecified: Secondary | ICD-10-CM

## 2019-11-06 DIAGNOSIS — B9620 Unspecified Escherichia coli [E. coli] as the cause of diseases classified elsewhere: Secondary | ICD-10-CM | POA: Diagnosis present

## 2019-11-06 DIAGNOSIS — Z66 Do not resuscitate: Secondary | ICD-10-CM | POA: Diagnosis present

## 2019-11-06 DIAGNOSIS — J189 Pneumonia, unspecified organism: Secondary | ICD-10-CM | POA: Diagnosis present

## 2019-11-06 DIAGNOSIS — Z8674 Personal history of sudden cardiac arrest: Secondary | ICD-10-CM

## 2019-11-06 DIAGNOSIS — Z8673 Personal history of transient ischemic attack (TIA), and cerebral infarction without residual deficits: Secondary | ICD-10-CM

## 2019-11-06 DIAGNOSIS — R5381 Other malaise: Secondary | ICD-10-CM | POA: Diagnosis present

## 2019-11-06 DIAGNOSIS — I132 Hypertensive heart and chronic kidney disease with heart failure and with stage 5 chronic kidney disease, or end stage renal disease: Secondary | ICD-10-CM | POA: Diagnosis present

## 2019-11-06 DIAGNOSIS — F329 Major depressive disorder, single episode, unspecified: Secondary | ICD-10-CM | POA: Diagnosis present

## 2019-11-06 DIAGNOSIS — N179 Acute kidney failure, unspecified: Principal | ICD-10-CM | POA: Diagnosis present

## 2019-11-06 DIAGNOSIS — R4189 Other symptoms and signs involving cognitive functions and awareness: Secondary | ICD-10-CM | POA: Diagnosis present

## 2019-11-06 DIAGNOSIS — R059 Cough, unspecified: Secondary | ICD-10-CM

## 2019-11-06 DIAGNOSIS — R946 Abnormal results of thyroid function studies: Secondary | ICD-10-CM | POA: Diagnosis present

## 2019-11-06 DIAGNOSIS — E039 Hypothyroidism, unspecified: Secondary | ICD-10-CM | POA: Diagnosis present

## 2019-11-06 DIAGNOSIS — J9601 Acute respiratory failure with hypoxia: Secondary | ICD-10-CM | POA: Diagnosis present

## 2019-11-06 DIAGNOSIS — Z992 Dependence on renal dialysis: Secondary | ICD-10-CM

## 2019-11-06 DIAGNOSIS — B964 Proteus (mirabilis) (morganii) as the cause of diseases classified elsewhere: Secondary | ICD-10-CM | POA: Diagnosis present

## 2019-11-06 DIAGNOSIS — R0902 Hypoxemia: Secondary | ICD-10-CM

## 2019-11-06 DIAGNOSIS — Z515 Encounter for palliative care: Secondary | ICD-10-CM

## 2019-11-06 DIAGNOSIS — I4819 Other persistent atrial fibrillation: Secondary | ICD-10-CM | POA: Diagnosis present

## 2019-11-06 DIAGNOSIS — R7881 Bacteremia: Secondary | ICD-10-CM | POA: Diagnosis present

## 2019-11-06 DIAGNOSIS — I10 Essential (primary) hypertension: Secondary | ICD-10-CM | POA: Diagnosis present

## 2019-11-06 LAB — CBC WITH DIFFERENTIAL/PLATELET
Abs Immature Granulocytes: 0.42 10*3/uL — ABNORMAL HIGH (ref 0.00–0.07)
Basophils Absolute: 0.1 10*3/uL (ref 0.0–0.1)
Basophils Relative: 0 %
Eosinophils Absolute: 0.1 10*3/uL (ref 0.0–0.5)
Eosinophils Relative: 0 %
HCT: 26 % — ABNORMAL LOW (ref 36.0–46.0)
Hemoglobin: 7.6 g/dL — ABNORMAL LOW (ref 12.0–15.0)
Immature Granulocytes: 3 %
Lymphocytes Relative: 17 %
Lymphs Abs: 2.6 10*3/uL (ref 0.7–4.0)
MCH: 29 pg (ref 26.0–34.0)
MCHC: 29.2 g/dL — ABNORMAL LOW (ref 30.0–36.0)
MCV: 99.2 fL (ref 80.0–100.0)
Monocytes Absolute: 0.8 10*3/uL (ref 0.1–1.0)
Monocytes Relative: 5 %
Neutro Abs: 11.1 10*3/uL — ABNORMAL HIGH (ref 1.7–7.7)
Neutrophils Relative %: 75 %
Platelets: 218 10*3/uL (ref 150–400)
RBC: 2.62 MIL/uL — ABNORMAL LOW (ref 3.87–5.11)
RDW: 18.4 % — ABNORMAL HIGH (ref 11.5–15.5)
WBC: 15 10*3/uL — ABNORMAL HIGH (ref 4.0–10.5)
nRBC: 0.2 % (ref 0.0–0.2)

## 2019-11-06 LAB — RESPIRATORY PANEL BY RT PCR (FLU A&B, COVID)
Influenza A by PCR: NEGATIVE
Influenza B by PCR: NEGATIVE
SARS Coronavirus 2 by RT PCR: NEGATIVE

## 2019-11-06 LAB — I-STAT CHEM 8, ED
BUN: 115 mg/dL — ABNORMAL HIGH (ref 8–23)
Calcium, Ion: 1.07 mmol/L — ABNORMAL LOW (ref 1.15–1.40)
Chloride: 108 mmol/L (ref 98–111)
Creatinine, Ser: 8.3 mg/dL — ABNORMAL HIGH (ref 0.44–1.00)
Glucose, Bld: 118 mg/dL — ABNORMAL HIGH (ref 70–99)
HCT: 25 % — ABNORMAL LOW (ref 36.0–46.0)
Hemoglobin: 8.5 g/dL — ABNORMAL LOW (ref 12.0–15.0)
Potassium: 4.4 mmol/L (ref 3.5–5.1)
Sodium: 138 mmol/L (ref 135–145)
TCO2: 17 mmol/L — ABNORMAL LOW (ref 22–32)

## 2019-11-06 LAB — URINALYSIS, ROUTINE W REFLEX MICROSCOPIC
Bilirubin Urine: NEGATIVE
Glucose, UA: NEGATIVE mg/dL
Hgb urine dipstick: NEGATIVE
Ketones, ur: 5 mg/dL — AB
Nitrite: NEGATIVE
Protein, ur: >=300 mg/dL — AB
Specific Gravity, Urine: 1.019 (ref 1.005–1.030)
WBC, UA: >50 WBC/hpf — ABNORMAL HIGH (ref 0–5)
pH: 7 (ref 5.0–8.0)

## 2019-11-06 LAB — COMPREHENSIVE METABOLIC PANEL
ALT: 11 U/L (ref 0–44)
AST: 11 U/L — ABNORMAL LOW (ref 15–41)
Albumin: 2.5 g/dL — ABNORMAL LOW (ref 3.5–5.0)
Alkaline Phosphatase: 75 U/L (ref 38–126)
Anion gap: 18 — ABNORMAL HIGH (ref 5–15)
BUN: 109 mg/dL — ABNORMAL HIGH (ref 8–23)
CO2: 16 mmol/L — ABNORMAL LOW (ref 22–32)
Calcium: 8.9 mg/dL (ref 8.9–10.3)
Chloride: 108 mmol/L (ref 98–111)
Creatinine, Ser: 8.01 mg/dL — ABNORMAL HIGH (ref 0.44–1.00)
GFR calc Af Amer: 5 mL/min — ABNORMAL LOW (ref >60)
GFR calc non Af Amer: 5 mL/min — ABNORMAL LOW (ref >60)
Glucose, Bld: 126 mg/dL — ABNORMAL HIGH (ref 70–99)
Potassium: 4.5 mmol/L (ref 3.5–5.1)
Sodium: 142 mmol/L (ref 135–145)
Total Bilirubin: 0.3 mg/dL (ref 0.3–1.2)
Total Protein: 6.5 g/dL (ref 6.5–8.1)

## 2019-11-06 LAB — PROTIME-INR
INR: 2.3 — ABNORMAL HIGH (ref 0.8–1.2)
Prothrombin Time: 25.2 s — ABNORMAL HIGH (ref 11.4–15.2)

## 2019-11-06 LAB — T4, FREE: Free T4: 0.86 ng/dL (ref 0.61–1.12)

## 2019-11-06 LAB — TSH: TSH: 8.797 u[IU]/mL — ABNORMAL HIGH (ref 0.350–4.500)

## 2019-11-06 MED ORDER — ONDANSETRON HCL 4 MG PO TABS: 4.0000 mg | ORAL_TABLET | Freq: Four times a day (QID) | ORAL | Status: DC | PRN

## 2019-11-06 MED ORDER — DORZOLAMIDE HCL-TIMOLOL MAL 2-0.5 % OP SOLN
1.0000 [drp] | Freq: Two times a day (BID) | OPHTHALMIC | Status: DC
  Administered 2019-11-06 – 2019-11-10 (×8): 1 [drp] via OPHTHALMIC
  Filled 2019-11-06: qty 10

## 2019-11-06 MED ORDER — ONDANSETRON HCL 4 MG/2ML IJ SOLN: 4.0000 mg | Freq: Four times a day (QID) | INTRAMUSCULAR | Status: DC | PRN

## 2019-11-06 MED ORDER — VALPROIC ACID 250 MG PO CAPS
250.0000 mg | ORAL_CAPSULE | Freq: Four times a day (QID) | ORAL | Status: DC
  Administered 2019-11-06 – 2019-11-10 (×13): 250 mg via ORAL
  Filled 2019-11-06 (×18): qty 1

## 2019-11-06 MED ORDER — CALCIUM CARBONATE ANTACID 1250 MG/5ML PO SUSP
500.0000 mg | Freq: Four times a day (QID) | ORAL | Status: DC | PRN
  Filled 2019-11-06: qty 5

## 2019-11-06 MED ORDER — AMIODARONE HCL 200 MG PO TABS
200.0000 mg | ORAL_TABLET | Freq: Every day | ORAL | Status: DC
  Administered 2019-11-07 – 2019-11-09 (×3): 200 mg via ORAL
  Filled 2019-11-06 (×3): qty 1

## 2019-11-06 MED ORDER — ATORVASTATIN CALCIUM 10 MG PO TABS
20.0000 mg | ORAL_TABLET | Freq: Every day | ORAL | Status: DC
  Administered 2019-11-07 – 2019-11-09 (×3): 20 mg via ORAL
  Filled 2019-11-06 (×3): qty 2

## 2019-11-06 MED ORDER — BRIMONIDINE TARTRATE 0.15 % OP SOLN
1.0000 [drp] | Freq: Two times a day (BID) | OPHTHALMIC | Status: DC
  Administered 2019-11-06 – 2019-11-10 (×9): 1 [drp] via OPHTHALMIC
  Filled 2019-11-06: qty 5

## 2019-11-06 MED ORDER — PRO-STAT SUGAR FREE PO LIQD
30.0000 mL | Freq: Every day | ORAL | Status: DC
  Administered 2019-11-07 – 2019-11-09 (×3): 30 mL via ORAL
  Filled 2019-11-06 (×3): qty 30

## 2019-11-06 MED ORDER — APIXABAN 2.5 MG PO TABS
2.5000 mg | ORAL_TABLET | Freq: Two times a day (BID) | ORAL | Status: DC
  Administered 2019-11-06 – 2019-11-09 (×7): 2.5 mg via ORAL
  Filled 2019-11-06 (×7): qty 1

## 2019-11-06 MED ORDER — HEPARIN SODIUM (PORCINE) 5000 UNIT/ML IJ SOLN: 5000.0000 [IU] | Freq: Three times a day (TID) | INTRAMUSCULAR | Status: DC

## 2019-11-06 MED ORDER — SORBITOL 70 % SOLN
30.0000 mL | Status: DC | PRN
  Filled 2019-11-06: qty 30

## 2019-11-06 MED ORDER — METOPROLOL TARTRATE 25 MG PO TABS: 12.5000 mg | ORAL_TABLET | Freq: Two times a day (BID) | ORAL | Status: DC

## 2019-11-06 MED ORDER — HYDROXYZINE HCL 25 MG PO TABS: 25.0000 mg | ORAL_TABLET | Freq: Three times a day (TID) | ORAL | Status: DC | PRN

## 2019-11-06 MED ORDER — ARIPIPRAZOLE 5 MG PO TABS
10.0000 mg | ORAL_TABLET | Freq: Two times a day (BID) | ORAL | Status: DC
  Administered 2019-11-06 – 2019-11-09 (×7): 10 mg via ORAL
  Filled 2019-11-06: qty 2
  Filled 2019-11-06: qty 1
  Filled 2019-11-06 (×2): qty 2
  Filled 2019-11-06 (×2): qty 1
  Filled 2019-11-06 (×3): qty 2

## 2019-11-06 MED ORDER — SODIUM CHLORIDE 0.9 % IV BOLUS
250.0000 mL | Freq: Once | INTRAVENOUS | Status: AC
  Administered 2019-11-06: 250 mL via INTRAVENOUS

## 2019-11-06 MED ORDER — ZOLPIDEM TARTRATE 5 MG PO TABS
5.0000 mg | ORAL_TABLET | Freq: Every evening | ORAL | Status: DC | PRN
  Administered 2019-11-06: 5 mg via ORAL
  Filled 2019-11-06 (×2): qty 1

## 2019-11-06 MED ORDER — CAMPHOR-MENTHOL 0.5-0.5 % EX LOTN
1.0000 "application " | TOPICAL_LOTION | Freq: Three times a day (TID) | CUTANEOUS | Status: DC | PRN
  Filled 2019-11-06: qty 222

## 2019-11-06 MED ORDER — NEPRO/CARBSTEADY PO LIQD: 237.0000 mL | Freq: Three times a day (TID) | ORAL | Status: DC | PRN

## 2019-11-06 MED ORDER — ACETAMINOPHEN 650 MG RE SUPP: 650.0000 mg | Freq: Four times a day (QID) | RECTAL | Status: DC | PRN

## 2019-11-06 MED ORDER — ACETAMINOPHEN 325 MG PO TABS
650.0000 mg | ORAL_TABLET | Freq: Four times a day (QID) | ORAL | Status: DC | PRN
  Filled 2019-11-06: qty 2

## 2019-11-06 MED ORDER — NEPRO/CARBSTEADY PO LIQD
237.0000 mL | Freq: Two times a day (BID) | ORAL | Status: DC
  Administered 2019-11-07 – 2019-11-09 (×4): 237 mL via ORAL

## 2019-11-06 MED ORDER — DOCUSATE SODIUM 283 MG RE ENEM
1.0000 | ENEMA | RECTAL | Status: DC | PRN
  Filled 2019-11-06: qty 1

## 2019-11-06 MED ORDER — SODIUM CHLORIDE 0.9% FLUSH
3.0000 mL | Freq: Two times a day (BID) | INTRAVENOUS | Status: DC
  Administered 2019-11-06 – 2019-11-10 (×7): 3 mL via INTRAVENOUS

## 2019-11-06 NOTE — ED Notes (Signed)
[facility] [place]) called and stated that pt had been in an inpatient rehab facility, got discharged on 10/19/2019. Pt received her last dialysis session there and has missed all outpatient dialysis appointments since. Dialysis center [phone]

## 2019-11-06 NOTE — Consult Note (Signed)
Renal Service Consult Note Jaclyn Day 10/30/2019 [name] Requesting Physician:  DR Eulis Foster  Reason for Consult:  ESRD pt w/  HPI: The patient is a 74 y.o. year-old w/ hx of atrial fib, HTN, diast HF, schizoaffective d/o, developmental disorder and CKD progressed to ESRD and started HD during last admit Oct - Dec 2020.  Pt sent to ED via [facility] for missed dialysis. Pt missed about 3 weeks of HD.  Was found at home covered in feces and bruises. BP's low in 70's - 80's> 100's.  Asked to see for ESRD.    Pt had CKD IV prior to admit in Oct 2020 as below due to HTN and solitary kidney.   Patient admitted 10/22 - 09/24/19 found down at home required CPR 45min and epi then ICU admit on vent.  New CM EF 20-25%, and +MSSA bacteremia. Source not mentioned in the DC summ. She got IV vanc then ancef for 4 wks total completed on 12/7 per ID recs. Repeat echo showed EF 55%. hosp course c/b afib/ RVR, AME/ agitatoin, aKI on CKD due to ATN. Required CRRT then iHD.  R TDC placed 08/23/19. MRI brain showed subacute bilat cortical and cerebellar infarcts. She got tube feeds then transitioned to oral feeding. PT recommended SNF and they found a place after much looking. She was in hospital x 52 days.  She tolerated HD In recliner prior to dc.   Patient has no specific c/o at this time. She lives w/ her boyfriend. Has a sister, no other family mentioned.  Denies any physical abuse. Has not been to dialysis since 10/15/19, states "I thought I didn't need it".  She denied having transportation problem, states her boyfriend would take her "but she refused to go".  No sob, cough, or CP, no abd pain.    Per H&P pt left SNF and went home and didn't go to dialysis thereafter.      ROS  denies CP  no joint pain   no HA  no blurry vision  no rash  no diarrhea  no nausea/ vomiting   Past Medical History  Past Medical History:  Diagnosis Date  . Anemia   . Chronic  diastolic CHF (congestive heart failure) ([place])   . CKD (chronic kidney disease), stage IV ([place])   . Hypertension   . Persistent atrial fibrillation ([place])    a. dx 01/2019 s/p TEE DCCV.  Marland Kitchen Renal disorder   . Unilateral congenital absence of kidney    Past Surgical History  Past Surgical History:  Procedure Laterality Date  . CARDIOVERSION N/A 01/31/2019   Procedure: CARDIOVERSION;  Surgeon: Lelon Perla, MD;  Location: [facility] ENDOSCOPY;  Service: Cardiovascular;  Laterality: N/A;  . IR FLUORO GUIDE CV LINE RIGHT  08/23/2019  . IR US GUIDE VASC ACCESS RIGHT  08/23/2019  . TEE WITHOUT CARDIOVERSION N/A 01/31/2019   Procedure: TRANSESOPHAGEAL ECHOCARDIOGRAM (TEE);  Surgeon: Lelon Perla, MD;  Location: [facility] ENDOSCOPY;  Service: Cardiovascular;  Laterality: N/A;   Family History  Family History  Problem Relation Age of Onset  . Atrial fibrillation Father   . Atrial fibrillation Brother    Social History  reports that she has never smoked. She has never used smokeless tobacco. She reports previous alcohol use. She reports previous drug use. Allergies  Allergies  Allergen Reactions  . Codeine Other (See Comments)    Reaction not recalled by family- was told to never take this  Home medications Prior to Admission medications   Medication Sig Start Date End Date Taking? Authorizing Provider  acetaminophen (TYLENOL) 500 MG tablet Take 1,000 mg by mouth every 8 (eight) hours as needed for mild pain or headache.     [provider]  Amino Acids-Protein Hydrolys (FEEDING SUPPLEMENT, PRO-STAT SUGAR FREE 64,) LIQD Take 30 mLs by mouth daily. For malnutrition 10/17/19   Medina-Vargas, Monina C, NP  amiodarone (PACERONE) 200 MG tablet Take 1 tablet (200 mg total) by mouth daily. 10/17/19 11/16/19  Medina-Vargas, Monina C, NP  ARIPiprazole (ABILIFY) 10 MG tablet Take 1 tablet (10 mg total) by mouth 2 (two) times daily. 10/17/19   Medina-Vargas, Monina C, NP  atorvastatin (LIPITOR) 20 MG tablet  Take 1 tablet (20 mg total) by mouth daily. 10/17/19   Medina-Vargas, Monina C, NP  bisacodyl (DULCOLAX) 10 MG suppository If not relieved by MOM, give 10 mg Bisacodyl suppositiory rectally X 1 dose in 24 hours as needed (Do not use constipation standing orders for residents with renal failure/CFR less than 30. Contact MD for orders) (Physician Order)    [provider]  brimonidine (ALPHAGAN) 0.15 % ophthalmic solution Place 1 drop into both eyes 2 (two) times daily. 10/17/19   Medina-Vargas, Monina C, NP  dorzolamide-timolol (COSOPT) 22.3-6.8 MG/ML ophthalmic solution Place 1 drop into both eyes 2 (two) times daily. 10/17/19   Medina-Vargas, Monina C, NP  ferrous sulfate 325 (65 FE) MG tablet Take 1 tablet (325 mg total) by mouth daily. 10/17/19 11/16/19  Medina-Vargas, Monina C, NP  metoprolol tartrate (LOPRESSOR) 25 MG tablet Take 0.5 tablets (12.5 mg total) by mouth 2 (two) times daily. 10/17/19 11/16/19  Medina-Vargas, Monina C, NP  NON FORMULARY 1500 cc Fluid Restriction    [provider]  Nutritional Supplements (FEEDING SUPPLEMENT, NEPRO CARB STEADY,) LIQD NEPRO CARB STEADY LIQUID DRINK I CAN BY MOUTH TWICE A DAY FOR HEALTH MAINTENANCE    [provider]  Sodium Phosphates (RA SALINE ENEMA RE) If not relieved by Biscodyl suppository, give disposable Saline Enema rectally X 1 dose/24 hrs as needed (Do not use constipation standing orders for residents with renal failure/CFR less than 30. Contact MD for orders)(Physician Or    [provider]  valproic acid (DEPAKENE) 250 MG capsule Take 1 capsule (250 mg total) by mouth every 6 (six) hours. 10/17/19 11/16/19  Medina-Vargas, Monina C, NP  warfarin (COUMADIN) 2 MG tablet Take 1 tablet (2 mg total) by mouth daily. 10/17/19   Medina-Vargas, Jaymes Graff C, NP   Liver Function Tests Recent Labs  Lab 10/30/2019 1345  AST 11*  ALT 11  ALKPHOS 75  BILITOT 0.3  PROT 6.5  ALBUMIN 2.5*   No results for input(s): LIPASE, AMYLASE in the  last 168 hours. CBC Recent Labs  Lab 10/16/2019 1345 11/02/2019 1428  WBC 15.0*  --   NEUTROABS 11.1*  --   HGB 7.6* 8.5*  HCT 26.0* 25.0*  MCV 99.2  --   PLT 218  --    Basic Metabolic Panel Recent Labs  Lab 10/21/2019 1345 11/05/2019 1428  NA 142 138  K 4.5 4.4  CL 108 108  CO2 16*  --   GLUCOSE 126* 118*  BUN 109* 115*  CREATININE 8.01* 8.30*  CALCIUM 8.9  --    Iron/TIBC/Ferritin/ %Sat    Component Value Date/Time   IRON 12 (L) 08/21/2019 1040   TIBC 189 (L) 08/21/2019 1040   FERRITIN 320 (H) 08/21/2019 1040   IRONPCTSAT 6 (L)  08/21/2019 1040    Vitals:   10/26/2019 1510 10/17/2019 1533 11/10/2019 1535 10/13/2019 1540  BP: 114/87 113/79 (!) 91/55 (!) 76/56  Pulse:      Resp: (!) 28 (!) 32 (!) 36 20  Temp:      TempSrc:      SpO2:        Exam Gen chron ill elderly female No rash, cyanosis or gangrene Sclera anicteric, throat clear  No jvd or bruits Chest clear bilat to bases RRR no MRG Abd soft ntnd no mass or ascites +bs GU defer  MS no joint effusions or deformity Ext trace LE edema, RIJ TDC, mult bruises around the R shoulder and less so on the L arm Neuro is alert, Ox 3 , nf. gen weak    Home meds:  - amiodarone/ warfarin  - valproic acid/ aripiprazole 10 bid  - atorvastatin 20  - metoprolol 12.5 bid   - prn's/ vitamins/ supplements     Outpt HD: [facility]   4h  400/800   74.5kg  2/2 bath  TDC  Hep 2300   venofer 50 qwk   mircera 100 ug q2, last 1/3   last HD 10/17/19    Assessment/ Plan: 1. Missed dialysis/ ESRD / uremia / azotemia - pt missed 3 wks of HD after dc from SNF to home early Jan. Mild uremia. Will plan HD in am tomorrow.  2. HTN - bp's low, holding lopressor 3. Atrial fib - rate controlled on amio, holding BB 4. Recent MSSA bacteremia - treated, no sx's of recurrence 5. Cognitive impairment - cont abilify 6. Recent cardiac arrest/ CVA - improved, but deconditioned. PT/OT consulted per pmd      Kelly Splinter  MD 11/05/2019, 4:19  PM

## 2019-11-06 NOTE — ED Provider Notes (Signed)
[facility] EMERGENCY DEPARTMENT Provider Note   CSN: [id number] Arrival date & time: 11/05/2019  1329     History Chief Complaint  Patient presents with  . Missed Dialysis    Jaclyn Day is a 74 y.o. female.  HPI She presents by EMS for evaluation of missed dialysis, with concern for bruising and poor upkeep.  Patient only complaint is that she wants something to drink.  She states that her boyfriend checked with her doctor and was told that she did not need dialysis, which is why she did not go.  She is unable to specify anything further.  Level 5 caveat-poor historian     Additional history:  Patient's significant other, Jaclyn Day, phone number [phone] states he has been frustrated since she got home from the nursing home, after discharge, 2 weeks ago.  She has not received dialysis because he did not know she needed to have that done.  He is concerned that she has been bruising a lot since being started on Eliquis last week by her PCP.  Also she fell out of bed last week and injured her right shoulder, and head.  He states that she has been doing well with eating and drinking.  She is receiving home health care by PT and nursing, twice a week.  She continues to be bedbound since hospitalization.  She had blood drawn last Friday but he does not know the results.  She is taking her medications.  He does not think that she has been having a fever, coughing or vomiting.  He does not know of any other acute abnormalities at this time.  Nursing home discharge, 10/16/2019-notable problems addressed included; follow-up care for bacteremia, follow-up care for cardiac arrest, atrial fibrillation with RVR care, end-stage renal disease, requiring dialysis, anemia, schizoaffective disorder, cerebral infarction with left-sided weakness discharged on Coumadin   Past Medical History:  Diagnosis Date  . Anemia   . Chronic diastolic CHF (congestive heart failure)  ([place])   . CKD (chronic kidney disease), stage IV ([place])   . Hypertension   . Persistent atrial fibrillation ([place])    a. dx 01/2019 s/p TEE DCCV.  Marland Kitchen Renal disorder   . Unilateral congenital absence of kidney     Patient Active Problem List   Diagnosis Date Noted  . Anemia associated with acute blood loss 09/26/2019  . Hyperlipidemia 09/26/2019  . Schizoaffective disorder ([place]) 09/26/2019  . Cerebral embolism with cerebral infarction 08/22/2019  . Advanced care planning/counseling discussion   . Goals of care, counseling/discussion   . Palliative care by specialist   . Closed fracture of multiple ribs   . Malnutrition of moderate degree 08/16/2019  . Acute bacterial endocarditis   . Acute on chronic combined systolic and diastolic CHF (congestive heart failure) ([place])   . MSSA bacteremia 08/07/2019  . Acute renal failure superimposed on stage 4 chronic kidney disease ([place])   . Elevated troponin   . Persistent atrial fibrillation ([place])   . Acute respiratory failure with hypoxia ([place])   . History of cardiac arrest 08/03/2019  . Pressure injury of skin 08/03/2019  . Atrial fibrillation with RVR ([place]) 01/25/2019  . CHF (congestive heart failure) ([place]) 01/25/2019  . Hypoxia   . Renal insufficiency   . Renal disorder   . Hypertension     Past Surgical History:  Procedure Laterality Date  . CARDIOVERSION N/A 01/31/2019   Procedure: CARDIOVERSION;  Surgeon: Lelon Perla, MD;  Location: [facility] ENDOSCOPY;  Service: Cardiovascular;  Laterality: N/A;  . IR FLUORO GUIDE CV LINE RIGHT  08/23/2019  . IR US GUIDE VASC ACCESS RIGHT  08/23/2019  . TEE WITHOUT CARDIOVERSION N/A 01/31/2019   Procedure: TRANSESOPHAGEAL ECHOCARDIOGRAM (TEE);  Surgeon: Lelon Perla, MD;  Location: [facility] ENDOSCOPY;  Service: Cardiovascular;  Laterality: N/A;     OB History   No obstetric history on file.     Family History  Problem Relation Age of Onset  . Atrial fibrillation Father   . Atrial fibrillation  Brother     Social History   Tobacco Use  . Smoking status: Never Smoker  . Smokeless tobacco: Never Used  Substance Use Topics  . Alcohol use: Not Currently  . Drug use: Not Currently    Home Medications Prior to Admission medications   Medication Sig Start Date End Date Taking? Authorizing Provider  acetaminophen (TYLENOL) 500 MG tablet Take 1,000 mg by mouth every 8 (eight) hours as needed for mild pain or headache.     [provider]  Amino Acids-Protein Hydrolys (FEEDING SUPPLEMENT, PRO-STAT SUGAR FREE 64,) LIQD Take 30 mLs by mouth daily. For malnutrition 10/17/19   Medina-Vargas, Monina C, NP  amiodarone (PACERONE) 200 MG tablet Take 1 tablet (200 mg total) by mouth daily. 10/17/19 11/16/19  Medina-Vargas, Monina C, NP  ARIPiprazole (ABILIFY) 10 MG tablet Take 1 tablet (10 mg total) by mouth 2 (two) times daily. 10/17/19   Medina-Vargas, Monina C, NP  atorvastatin (LIPITOR) 20 MG tablet Take 1 tablet (20 mg total) by mouth daily. 10/17/19   Medina-Vargas, Monina C, NP  bisacodyl (DULCOLAX) 10 MG suppository If not relieved by MOM, give 10 mg Bisacodyl suppositiory rectally X 1 dose in 24 hours as needed (Do not use constipation standing orders for residents with renal failure/CFR less than 30. Contact MD for orders) (Physician Order)    [provider]  brimonidine (ALPHAGAN) 0.15 % ophthalmic solution Place 1 drop into both eyes 2 (two) times daily. 10/17/19   Medina-Vargas, Monina C, NP  dorzolamide-timolol (COSOPT) 22.3-6.8 MG/ML ophthalmic solution Place 1 drop into both eyes 2 (two) times daily. 10/17/19   Medina-Vargas, Monina C, NP  ferrous sulfate 325 (65 FE) MG tablet Take 1 tablet (325 mg total) by mouth daily. 10/17/19 11/16/19  Medina-Vargas, Monina C, NP  metoprolol tartrate (LOPRESSOR) 25 MG tablet Take 0.5 tablets (12.5 mg total) by mouth 2 (two) times daily. 10/17/19 11/16/19  Medina-Vargas, Monina C, NP  NON FORMULARY 1500 cc Fluid Restriction    [provider]  Nutritional Supplements (FEEDING SUPPLEMENT, NEPRO CARB STEADY,) LIQD NEPRO CARB STEADY LIQUID DRINK I CAN BY MOUTH TWICE A DAY FOR HEALTH MAINTENANCE    [provider]  Sodium Phosphates (RA SALINE ENEMA RE) If not relieved by Biscodyl suppository, give disposable Saline Enema rectally X 1 dose/24 hrs as needed (Do not use constipation standing orders for residents with renal failure/CFR less than 30. Contact MD for orders)(Physician Or    [provider]  valproic acid (DEPAKENE) 250 MG capsule Take 1 capsule (250 mg total) by mouth every 6 (six) hours. 10/17/19 11/16/19  Medina-Vargas, Monina C, NP  warfarin (COUMADIN) 2 MG tablet Take 1 tablet (2 mg total) by mouth daily. 10/17/19   Medina-Vargas, Monina C, NP    Allergies    Codeine  Review of Systems   Review of Systems  Unable to perform ROS: Other    Physical Exam Updated Vital Signs BP (!) 76/56   Pulse Marland Kitchen)  113   Temp 97.7 F (36.5 C) (Oral)   Resp 20   SpO2 100%   Physical Exam Vitals and nursing note reviewed.  Constitutional:      General: She is not in acute distress.    Appearance: She is well-developed. She is ill-appearing. She is not toxic-appearing or diaphoretic.  HENT:     Head: Normocephalic and atraumatic.     Right Ear: External ear normal.     Left Ear: External ear normal.     Nose: Nose normal.     Mouth/Throat:     Comments: Dry oral mucous membranes, nonspecific dark brown/red material on the lips. Eyes:     Conjunctiva/sclera: Conjunctivae normal.     Pupils: Pupils are equal, round, and reactive to light.  Neck:     Trachea: Phonation normal.     Comments: Vascular catheter, right upper chest wall, site implants appear normal.  There is old appearing bruising above this catheter, entry site.  It extends across the right clavicular and trapezius regions. Cardiovascular:     Rate and Rhythm: Regular rhythm. Tachycardia present.     Heart sounds: Normal heart sounds.  Pulmonary:      Effort: Pulmonary effort is normal.     Breath sounds: Rhonchi present.     Comments: Tachypneic Abdominal:     General: There is no distension.     Palpations: Abdomen is soft.     Tenderness: There is no abdominal tenderness.  Musculoskeletal:        General: Normal range of motion.     Cervical back: Normal range of motion and neck supple. No rigidity.     Comments: Normal strength arms and legs bilaterally.  No large joint or extremity deformities.  Skin:    General: Skin is warm and dry.     Comments: Scattered bruising on arms and legs, nonspecific appearance and no evidence for significant traumatic injuries.  Neurological:     Mental Status: She is alert.     Cranial Nerves: No cranial nerve deficit.     Sensory: No sensory deficit.     Motor: No abnormal muscle tone.     Coordination: Coordination normal.     Comments: No dysarthria or aphasia.  No facial asymmetry.  She is cooperative.  Follows commands well.  Psychiatric:        Mood and Affect: Mood normal.        Behavior: Behavior normal.     ED Results / Procedures / Treatments   Labs (all labs ordered are listed, but only abnormal results are displayed) Labs Reviewed  CBC WITH DIFFERENTIAL/PLATELET - Abnormal; Notable for the following components:      Result Value   WBC 15.0 (*)    RBC 2.62 (*)    Hemoglobin 7.6 (*)    HCT 26.0 (*)    MCHC 29.2 (*)    RDW 18.4 (*)    Neutro Abs 11.1 (*)    Abs Immature Granulocytes 0.42 (*)    All other components within normal limits  COMPREHENSIVE METABOLIC PANEL - Abnormal; Notable for the following components:   CO2 16 (*)    Glucose, Bld 126 (*)    BUN 109 (*)    Creatinine, Ser 8.01 (*)    Albumin 2.5 (*)    AST 11 (*)    GFR calc non Af Amer 5 (*)    GFR calc Af Amer 5 (*)    Anion gap 18 (*)  All other components within normal limits  I-STAT CHEM 8, ED - Abnormal; Notable for the following components:   BUN 115 (*)    Creatinine, Ser 8.30 (*)     Glucose, Bld 118 (*)    Calcium, Ion 1.07 (*)    TCO2 17 (*)    Hemoglobin 8.5 (*)    HCT 25.0 (*)    All other components within normal limits    EKG EKG Interpretation  Date/Time:  Monday November 06 2019 13:45:51 EST Ventricular Rate:  118 PR Interval:    QRS Duration: 109 QT Interval:  348 QTC Calculation: 488 R Axis:   104 Text Interpretation: Atrial fibrillation Right axis deviation Repol abnrm suggests ischemia, anterolateral Baseline wander in lead(s) V4 Since last tracing rate faster , nonspecific T wave abnormality Otherwise no significant change Confirmed by Jaclyn Day [phone]) on 11/07/2019 3:05:22 PM   Radiology DG Chest Port 1 View  Result Date: 10/28/2019 CLINICAL DATA:  Bruising to right shoulder. EXAM: PORTABLE CHEST 1 VIEW COMPARISON:  Chest x-ray dated August 31, 2019. FINDINGS: Unchanged tunneled right internal jugular dialysis catheter. Stable cardiomegaly. Improved bilateral pleural effusions, now small. Improved aeration at the right lung base. Persistent left basilar atelectasis. No pneumothorax. Healing left rib fractures. No acute osseous abnormality. IMPRESSION: Improved bilateral pleural effusions, now small. Electronically Signed   By: Titus Dubin M.D.   On: 11/09/2019 14:17    Procedures .Critical Care Performed by: Jaclyn Bo, MD Authorized by: Jaclyn Bo, MD   Critical care provider statement:    Critical care time (minutes):  35   Critical care time was exclusive of:  Separately billable procedures and treating other patients   Critical care was necessary to treat or prevent imminent or life-threatening deterioration of the following conditions:  Metabolic crisis   Critical care was time spent personally by me on the following activities:  Blood draw for specimens, development of treatment plan with patient or surrogate, discussions with consultants, evaluation of patient's response to treatment, examination of patient, obtaining  history from patient or surrogate, ordering and performing treatments and interventions, ordering and review of laboratory studies, pulse oximetry, re-evaluation of patient's condition, review of old charts and ordering and review of radiographic studies   (including critical care time)  Medications Ordered in ED Medications  sodium chloride 0.9 % bolus 250 mL (0 mLs Intravenous Stopped 10/30/2019 1501)    ED Course  I have reviewed the triage vital signs and the nursing notes.  Pertinent labs & imaging results that were available during my care of the patient were reviewed by me and considered in my medical decision making (see chart for details).  Clinical Course as of Nov 05 1557  Mon Nov 06, 2019  1502 Normal except BUN high, creatinine high, glucose high, calcium low, total CO2 low, hemoglobin low  I-stat chem 8, ED (not at [facility] or [facility])(!) [EW]  1503 Normal except white count high, hemoglobin low  CBC with Differential(!) [EW]  1503 Consistent with fluid overload, bilateral pleural effusions, interpreted by me  DG Chest Port 1 View [EW]  1512 Normal except CO2 low, glucose high, BUN high, creatinine high, albumin low, AST low, T4 low, anion gap high  Comprehensive metabolic panel(!) [EW]  [id number] Case discussed with nephrology, Dr. Soyla Day who will come to the ED to evaluate the patient for disposition.   [EW]    Clinical Course User Index [EW] Jaclyn Bo, MD   MDM Rules/Calculators/A&P  Patient Vitals for the past 24 hrs:  BP Temp Temp src Pulse Resp SpO2  10/19/2019 1540 (!) 76/56 -- -- -- 20 --  11/08/2019 1535 (!) 91/55 -- -- -- (!) 36 --  11/10/2019 1533 113/79 -- -- -- (!) 32 --  10/30/2019 1510 114/87 -- -- -- (!) 28 --  11/12/2019 1509 -- -- -- (!) 113 -- 100 %  11/10/2019 1505 101/74 -- -- -- (!) 32 --  10/18/2019 1500 105/70 -- -- -- (!) 24 --  11/05/2019 1445 111/75 -- -- -- (!) 38 --  11/01/2019 1441 109/61 -- -- -- 20 --  10/15/2019 1426 -- -- -- (!) 109  (!) 33 100 %  11/05/2019 1425 97/81 -- -- -- (!) 35 --  10/18/2019 1420 97/67 -- -- -- (!) 28 --  10/29/2019 1415 -- -- -- (!) 102 (!) 43 100 %  11/10/2019 1410 (!) 85/64 -- -- (!) 191 17 95 %  11/01/2019 1358 (!) 80/65 97.7 F (36.5 C) Oral (!) 130 (!) 30 96 %  11/02/2019 1357 -- -- -- -- -- 95 %  11/03/2019 1352 -- -- -- -- -- 96 %    3:59 PM Reevaluation with update and discussion. After initial assessment and treatment, an updated evaluation reveals She is drinking fluid and appears brighter. Seeing Nephrology now. Jaclyn Day   Medical Decision Making: Patient presenting for evaluation of multiple problems including missing dialysis.  BUN and creatinine much higher than baseline, 10/09/2019.  Chest x-ray indicates fluid overload.  Blood pressure low.  Patient treated with IV fluid bolus in the ED, for low blood pressure.  She appears clinically dry however has parameters consistent with need for dialysis.  Nephrology consulted, for help with evaluation and treatment.  Patient will require hospitalization for stabilization.  No overt signs of infection, or significant traumatic injury.  Patient reportedly fell from bed, 3 days ago.  Patient is still compromised from recent episode requiring cardiopulmonary resuscitation with prolonged hospitalization and subsequent nursing home rehab stay.  Is not clear that she will be stable for discharge, until additional testing to reestablish her new baseline.  Social services consulted for help with that.  Nephrology consult for initiation of dialysis treatment.  Patient may need inpatient hospitalization by a medical team for help with multiple medical problems.   Jaclyn Day was evaluated in Emergency Department on 10/17/2019 for the symptoms described in the history of present illness. She was evaluated in the context of the global COVID-19 pandemic, which necessitated consideration that the patient might be at risk for infection with the SARS-CoV-2 virus that  causes COVID-19. Institutional protocols and algorithms that pertain to the evaluation of patients at risk for COVID-19 are in a state of rapid change based on information released by regulatory bodies including the CDC and federal and state organizations. These policies and algorithms were followed during the patient's care in the ED.   CRITICAL CARE- yes Performed by: Jaclyn Day   Nursing Notes Reviewed/ Care Coordinated Applicable Imaging Reviewed Interpretation of Laboratory Data incorporated into ED treatment   Case discussed with hospitalist Jaclyn Day) to arrange admission)   Final Clinical Impression(s) / ED Diagnoses Final diagnoses:  Acute renal failure, unspecified acute renal failure type [facility])  Noncompliance    Rx / DC Orders ED Discharge Orders    None       Jaclyn Bo, MD 11/07/19 [phone]

## 2019-11-06 NOTE — ED Triage Notes (Signed)
Pt here from home via [facility] EMS for missed dialysis. Dialysis center called EMS because pt hasn't been in 3 weeks. Per EMS upon arrival, pt house was covered in feces, pt did not appear to be taken care of, pt covered in bruises. Pt lives with boyfriend. Per EMS BP 102/64, 70% O2 sat on RA so pt placed on 12 L NRB. Upon arrival here pt 96% on 2L , BP 80/64.

## 2019-11-06 NOTE — H&P (Signed)
History and Physical    [name] [id number] DOB: 05-31-1946 DOA: 11/01/2019  PCP: Jaclyn Bender, PA-C Consultants:  Revankar - cardiology Patient coming from: Home - lives with boyfriend; NOK: Boyfriend, Jaclyn Day, [phone]  Chief Complaint: Missed HD  HPI: Jaclyn Day is a 74 y.o. female with medical history significant of ESRD on HD; afib s/p DCCV 01/2019, on Coumadin; HTN; depression/anxiety; schizoaffective disorder; developmental disorder; and chronic diastolic CHF presenting with missed HD.   She left rehab and she couldn't walk and she needs a wheelchair.  Before she could use a wheelchair and a walker but now she can't use her legs.  She last walked before she went to rehab, but not while she was hospitalized.  She has been a little short of breath.  A little cough.  No chest pain.  No fevers.  No abdominal discomfort.  She last made urine "the other day."  She reports that she wasn't told she would need HD forever, just "about 4 or 6 days."  She has been getting shots that have been causing her arms to bruise.  She denies falling and denies anyone hurting her.  She is very worried that she is going to die.  She also doesn't want to be alone and wants her boyfriend to stay with her or me to.  She was previously admitted from 10/22-12/13 with cardiac arrest with ROSC.  She was found to have new systolic CHF with EF 0000000 with recovery to 55-60% while hospitalized; MSSA bacteremia; afib with RVR; AKI treated with HD; and multiple acute brain infarcts and started on valproic acid for stroke-associated seizure prophylaxis.  ED Course:   Needs HD, maybe not immediately.  She left SNF and went home with boyfriend and HD was inadvertently dropped.  She looks quite dry.  BP borderline, given a small bolus.  Has pleural effusions.  Review of Systems: As per HPI; otherwise review of systems reviewed and negative.   Ambulatory Status:  Currently non-ambulatory  Past Medical  History:  Diagnosis Date  . Anemia   . Chronic diastolic CHF (congestive heart failure) ([place])   . CKD (chronic kidney disease), stage IV ([place])   . Hypertension   . Persistent atrial fibrillation ([place])    a. dx 01/2019 s/p TEE DCCV.  Marland Kitchen Renal disorder   . Unilateral congenital absence of kidney     Past Surgical History:  Procedure Laterality Date  . CARDIOVERSION N/A 01/31/2019   Procedure: CARDIOVERSION;  Surgeon: Lelon Perla, MD;  Location: [facility] ENDOSCOPY;  Service: Cardiovascular;  Laterality: N/A;  . IR FLUORO GUIDE CV LINE RIGHT  08/23/2019  . IR US GUIDE VASC ACCESS RIGHT  08/23/2019  . TEE WITHOUT CARDIOVERSION N/A 01/31/2019   Procedure: TRANSESOPHAGEAL ECHOCARDIOGRAM (TEE);  Surgeon: Lelon Perla, MD;  Location: [facility] ENDOSCOPY;  Service: Cardiovascular;  Laterality: N/A;    Social History   Socioeconomic History  . Marital status: Single    Spouse name: Not on file  . Number of children: Not on file  . Years of education: Not on file  . Highest education level: Not on file  Occupational History  . Not on file  Tobacco Use  . Smoking status: Never Smoker  . Smokeless tobacco: Never Used  Substance and Sexual Activity  . Alcohol use: Not Currently  . Drug use: Not Currently  . Sexual activity: Not on file  Other Topics Concern  . Not on file  Social History Narrative   Lives  with her boyfriend in [place]. He is her POA.    Social Determinants of Health   Financial Resource Strain:   . Difficulty of Paying Day Expenses: Not on file  Food Insecurity:   . Worried About Charity fundraiser in the Last Year: Not on file  . Ran Out of Food in the Last Year: Not on file  Transportation Needs:   . Lack of Transportation (Medical): Not on file  . Lack of Transportation (Non-Medical): Not on file  Physical Activity:   . Days of Exercise per Week: Not on file  . Minutes of Exercise per Session: Not on file  Stress:   . Feeling of Stress : Not on file    Social Connections:   . Frequency of Communication with Friends and Family: Not on file  . Frequency of Social Gatherings with Friends and Family: Not on file  . Attends Religious Services: Not on file  . Active Member of Clubs or Organizations: Not on file  . Attends Archivist Meetings: Not on file  . Marital Status: Not on file  Intimate Partner Violence:   . Fear of Current or Ex-Partner: Not on file  . Emotionally Abused: Not on file  . Physically Abused: Not on file  . Sexually Abused: Not on file    Allergies  Allergen Reactions  . Codeine Other (See Comments)    Reaction not recalled by family- was told to never take this    Family History  Problem Relation Age of Onset  . Atrial fibrillation Father   . Atrial fibrillation Brother     Prior to Admission medications   Medication Sig Start Date End Date Taking? Authorizing Provider  acetaminophen (TYLENOL) 500 MG tablet Take 1,000 mg by mouth every 8 (eight) hours as needed for mild pain or headache.     [provider]  Amino Acids-Protein Hydrolys (FEEDING SUPPLEMENT, PRO-STAT SUGAR FREE 64,) LIQD Take 30 mLs by mouth daily. For malnutrition 10/17/19   Medina-Vargas, Monina C, NP  amiodarone (PACERONE) 200 MG tablet Take 1 tablet (200 mg total) by mouth daily. 10/17/19 11/16/19  Medina-Vargas, Monina C, NP  ARIPiprazole (ABILIFY) 10 MG tablet Take 1 tablet (10 mg total) by mouth 2 (two) times daily. 10/17/19   Medina-Vargas, Monina C, NP  atorvastatin (LIPITOR) 20 MG tablet Take 1 tablet (20 mg total) by mouth daily. 10/17/19   Medina-Vargas, Monina C, NP  bisacodyl (DULCOLAX) 10 MG suppository If not relieved by MOM, give 10 mg Bisacodyl suppositiory rectally X 1 dose in 24 hours as needed (Do not use constipation standing orders for residents with renal failure/CFR less than 30. Contact MD for orders) (Physician Order)    [provider]  brimonidine (ALPHAGAN) 0.15 % ophthalmic solution Place 1 drop  into both eyes 2 (two) times daily. 10/17/19   Medina-Vargas, Monina C, NP  dorzolamide-timolol (COSOPT) 22.3-6.8 MG/ML ophthalmic solution Place 1 drop into both eyes 2 (two) times daily. 10/17/19   Medina-Vargas, Monina C, NP  ferrous sulfate 325 (65 FE) MG tablet Take 1 tablet (325 mg total) by mouth daily. 10/17/19 11/16/19  Medina-Vargas, Monina C, NP  metoprolol tartrate (LOPRESSOR) 25 MG tablet Take 0.5 tablets (12.5 mg total) by mouth 2 (two) times daily. 10/17/19 11/16/19  Medina-Vargas, Monina C, NP  NON FORMULARY 1500 cc Fluid Restriction    [provider]  Nutritional Supplements (FEEDING SUPPLEMENT, NEPRO CARB STEADY,) LIQD NEPRO CARB STEADY LIQUID DRINK I CAN BY MOUTH TWICE A  [place] MAINTENANCE    [provider]  Sodium Phosphates (RA SALINE ENEMA RE) If not relieved by Biscodyl suppository, give disposable Saline Enema rectally X 1 dose/24 hrs as needed (Do not use constipation standing orders for residents with renal failure/CFR less than 30. Contact MD for orders)(Physician Or    [provider]  valproic acid (DEPAKENE) 250 MG capsule Take 1 capsule (250 mg total) by mouth every 6 (six) hours. 10/17/19 11/16/19  Medina-Vargas, Monina C, NP  warfarin (COUMADIN) 2 MG tablet Take 1 tablet (2 mg total) by mouth daily. 10/17/19   Medina-VargasSenaida Lange, NP    Physical Exam: Vitals:   11/02/2019 1720 10/31/2019 1730 10/18/2019 1740 10/30/2019 1824  BP: 97/72 97/79 104/71   Pulse:   (!) 44 (!) 124  Resp: (!) 29 (!) 32 (!) 40 (!) 24  Temp:      TempSrc:      SpO2:  95% 95% 99%     . General:  Appears anxious and chronically cognitively impaired . Eyes:  PERRL, EOMI, normal lids, iris . ENT:  grossly normal hearing, lips & tongue, mildly dry mm . Neck:  no LAD, masses or thyromegaly . Cardiovascular:  Irregularly irregular with mild tachycardia, no m/r/g. No LE edema.  Marland Kitchen Respiratory:   CTA bilaterally with no wheezes/rales/rhonchi.  Normal respiratory  effort. . Abdomen:  soft, NT, ND, NABS . Skin:  Diffuse bruising, particular R shoulder and upper arm which she relates to [facility] placement . Musculoskeletal:  grossly normal tone BUE/BLE, good ROM, no bony abnormality . Psychiatric:  anxious mood and affect, speech pressured but appropriate, chronically cognitively impaired . Neurologic:  CN 2-12 grossly intact, moves all extremities in coordinated fashion    Radiological Exams on Admission: DG Chest Port 1 View  Result Date: 11/07/2019 CLINICAL DATA:  Bruising to right shoulder. EXAM: PORTABLE CHEST 1 VIEW COMPARISON:  Chest x-ray dated August 31, 2019. FINDINGS: Unchanged tunneled right internal jugular dialysis catheter. Stable cardiomegaly. Improved bilateral pleural effusions, now small. Improved aeration at the right lung base. Persistent left basilar atelectasis. No pneumothorax. Healing left rib fractures. No acute osseous abnormality. IMPRESSION: Improved bilateral pleural effusions, now small. Electronically Signed   By: Titus Dubin M.D.   On: 11/12/2019 14:17    EKG: Independently reviewed.  Afib with rate 118; nonspecific ST changes with no evidence of acute ischemia   Labs on Admission: I have personally reviewed the available labs and imaging studies at the time of the admission.  Pertinent labs:   CO2 16 Glucose 126 BUN 109/Creatinine 8.01/GFR 5 Anion gap 18 Albumin 2.5 WBC 15.0 Hgb 7.6 TSH 9.730 on 12/28   Assessment/Plan Principal Problem:   Non-compliance with renal dialysis ([place]) Active Problems:   CHF (congestive heart failure) (HCC)   Hypertension   Persistent atrial fibrillation (HCC)   MSSA bacteremia   Hyperlipidemia   Schizoaffective disorder (HCC)   Abnormal thyroid function test   ESRD (end stage renal disease) (HCC)   Non-compliance with HD -Patient with recent complicated hospitalization for cardiac arrest, resulting in transition from advanced CKD to ESRD -She was started on HD, which  was continued upon placement in SNF -Unfortunately, after d/c from SNF this appears to have been dropped -She has not been dialyzed since 1/5 -She apparently went to her PCP Friday for blood testing and results today indicated need for HD and she was sent to the ER -When EMS arrived at her home, she was reportedly covered  in feces and bruises -Bruising appears to be related to [facility] placement previously (at least, according to the patient, who denies IPV) -Will need HD -Nephrology prn order set utilized -Nephrology is consulting  -Mild hypotension, does not appear to be volume overloaded  NonCPL -Patient has chronic cognitive impairment and apparently did not understand that HD will be permanent -She will need ongoing gentle education about this, as will her boyfriend  HTN -Hold Lopressor - BP is too low for anti-hypertensive medication at this time  Afib -Rate controlled with Amiodarone -Hold Lopressor -Coumadin dosing per pharmacy -INR was not checked on admission, will add  Chronic CHF -10/22 Echo with EF 20-25% and severe global hypokinesis after admission for cardiac arrest with CPR -Unchanged on 10/26 -By 11/9 EF had normalized -Patient appears mildly dry at this time and certainly not volume overloaded  Recent MSSA bacteremia -Treated -No current concern for infectious etiology  Recent abnormal TSH -TSH elevated on 12/28 -Will recheck now with free T4  Cognitive impairment -Developmental delay, schizoaffective d/o both listed as possible diagnoses -Continue Abilify  Glaucoma -Continue Alphagan, Cosopt  Recent cardiac arrest, CVA -Appears to be improved medically but deconditioned -PT/OT consults requested -May require more prolonged SNF rehab -Continue Valproic acid  Note: This patient has been tested and is negative for the novel coronavirus COVID-19.  DVT prophylaxis: Heparin Code Status: DNR - confirmed with paperwork from prior stay Family  Communication:  None present; I spoke with her boyfriend by telephone Disposition Plan:  Home once clinically improved Consults called: Nephrology; PT/OT/TOC team  Admission status: It is my clinical opinion that referral for OBSERVATION is reasonable and necessary in this patient based on the above information provided. The aforementioned taken together are felt to place the patient at high risk for further clinical deterioration. However it is anticipated that the patient may be medically stable for discharge from the hospital within 24 to 48 hours.    Karmen Bongo MD [facility]   How to contact the [facility] Attending or Consulting provider [place] or covering provider during after hours [place], for this patient?  1. Check the care team in [facility] and look for a) attending/consulting TRH provider listed and b) the [facility] team listed 2. Log into [email or web address] and use [place]'s universal password to access. If you do not have the password, please contact the hospital operator. 3. Locate the [facility] provider you are looking for under [facility] and page to a number that you can be directly reached. 4. If you still have difficulty reaching the provider, please page the [facility] (Director on Call) for the Hospitalists listed on amion for assistance.   11/08/2019, 6:28 PM

## 2019-11-06 NOTE — Progress Notes (Signed)
New Admission Note: ? Arrival Method: via stretcher  Mental Orientation: A/O x 3, person, place, situation Telemetry: Box #22 A. fib Assessment: Completed Skin: Refer to flowsheet IV: Right FA Pain: 0/10 Tubes: Safety Measures: Safety Fall Prevention Plan discussed with patient. Admission: Completed 5 [place] Orientation: Patient has been orientated to the room, unit and the staff. Family: Orders have been reviewed and are being implemented. Will continue to monitor the patient. Call light has been placed within reach and bed alarm has been activated.  ? [employer], [place]

## 2019-11-07 DIAGNOSIS — J189 Pneumonia, unspecified organism: Secondary | ICD-10-CM | POA: Diagnosis present

## 2019-11-07 DIAGNOSIS — N179 Acute kidney failure, unspecified: Secondary | ICD-10-CM | POA: Diagnosis present

## 2019-11-07 DIAGNOSIS — F329 Major depressive disorder, single episode, unspecified: Secondary | ICD-10-CM | POA: Diagnosis present

## 2019-11-07 DIAGNOSIS — Z20822 Contact with and (suspected) exposure to covid-19: Secondary | ICD-10-CM | POA: Diagnosis present

## 2019-11-07 DIAGNOSIS — H409 Unspecified glaucoma: Secondary | ICD-10-CM | POA: Diagnosis present

## 2019-11-07 DIAGNOSIS — F39 Unspecified mood [affective] disorder: Secondary | ICD-10-CM | POA: Diagnosis present

## 2019-11-07 DIAGNOSIS — N186 End stage renal disease: Secondary | ICD-10-CM | POA: Diagnosis present

## 2019-11-07 DIAGNOSIS — D631 Anemia in chronic kidney disease: Secondary | ICD-10-CM | POA: Diagnosis present

## 2019-11-07 DIAGNOSIS — J9601 Acute respiratory failure with hypoxia: Secondary | ICD-10-CM | POA: Diagnosis present

## 2019-11-07 DIAGNOSIS — E785 Hyperlipidemia, unspecified: Secondary | ICD-10-CM | POA: Diagnosis present

## 2019-11-07 DIAGNOSIS — Y95 Nosocomial condition: Secondary | ICD-10-CM | POA: Diagnosis present

## 2019-11-07 DIAGNOSIS — N3000 Acute cystitis without hematuria: Secondary | ICD-10-CM | POA: Diagnosis present

## 2019-11-07 DIAGNOSIS — Q600 Renal agenesis, unilateral: Secondary | ICD-10-CM | POA: Diagnosis not present

## 2019-11-07 DIAGNOSIS — Z7189 Other specified counseling: Secondary | ICD-10-CM | POA: Diagnosis not present

## 2019-11-07 DIAGNOSIS — R627 Adult failure to thrive: Secondary | ICD-10-CM | POA: Diagnosis not present

## 2019-11-07 DIAGNOSIS — B964 Proteus (mirabilis) (morganii) as the cause of diseases classified elsewhere: Secondary | ICD-10-CM | POA: Diagnosis present

## 2019-11-07 DIAGNOSIS — E039 Hypothyroidism, unspecified: Secondary | ICD-10-CM | POA: Diagnosis present

## 2019-11-07 DIAGNOSIS — F418 Other specified anxiety disorders: Secondary | ICD-10-CM | POA: Diagnosis present

## 2019-11-07 DIAGNOSIS — Z9115 Patient's noncompliance with renal dialysis: Secondary | ICD-10-CM | POA: Diagnosis not present

## 2019-11-07 DIAGNOSIS — I5032 Chronic diastolic (congestive) heart failure: Secondary | ICD-10-CM | POA: Diagnosis present

## 2019-11-07 DIAGNOSIS — I4819 Other persistent atrial fibrillation: Secondary | ICD-10-CM | POA: Diagnosis present

## 2019-11-07 DIAGNOSIS — F419 Anxiety disorder, unspecified: Secondary | ICD-10-CM | POA: Diagnosis present

## 2019-11-07 DIAGNOSIS — I132 Hypertensive heart and chronic kidney disease with heart failure and with stage 5 chronic kidney disease, or end stage renal disease: Secondary | ICD-10-CM | POA: Diagnosis present

## 2019-11-07 DIAGNOSIS — Z008 Encounter for other general examination: Secondary | ICD-10-CM | POA: Diagnosis not present

## 2019-11-07 DIAGNOSIS — B9620 Unspecified Escherichia coli [E. coli] as the cause of diseases classified elsewhere: Secondary | ICD-10-CM | POA: Diagnosis present

## 2019-11-07 DIAGNOSIS — Z66 Do not resuscitate: Secondary | ICD-10-CM | POA: Diagnosis present

## 2019-11-07 DIAGNOSIS — Z992 Dependence on renal dialysis: Secondary | ICD-10-CM | POA: Diagnosis not present

## 2019-11-07 DIAGNOSIS — Z515 Encounter for palliative care: Secondary | ICD-10-CM | POA: Diagnosis not present

## 2019-11-07 LAB — BASIC METABOLIC PANEL
Anion gap: 19 — ABNORMAL HIGH (ref 5–15)
BUN: 114 mg/dL — ABNORMAL HIGH (ref 8–23)
CO2: 13 mmol/L — ABNORMAL LOW (ref 22–32)
Calcium: 8.2 mg/dL — ABNORMAL LOW (ref 8.9–10.3)
Chloride: 106 mmol/L (ref 98–111)
Creatinine, Ser: 8.14 mg/dL — ABNORMAL HIGH (ref 0.44–1.00)
GFR calc Af Amer: 5 mL/min — ABNORMAL LOW (ref >60)
GFR calc non Af Amer: 4 mL/min — ABNORMAL LOW (ref >60)
Glucose, Bld: 81 mg/dL (ref 70–99)
Potassium: 5.4 mmol/L — ABNORMAL HIGH (ref 3.5–5.1)
Sodium: 138 mmol/L (ref 135–145)

## 2019-11-07 LAB — CBC
HCT: 21.4 % — ABNORMAL LOW (ref 36.0–46.0)
HCT: 27.3 % — ABNORMAL LOW (ref 36.0–46.0)
Hemoglobin: 6.8 g/dL — CL (ref 12.0–15.0)
Hemoglobin: 8.7 g/dL — ABNORMAL LOW (ref 12.0–15.0)
MCH: 29.6 pg (ref 26.0–34.0)
MCH: 29.7 pg (ref 26.0–34.0)
MCHC: 31.8 g/dL (ref 30.0–36.0)
MCHC: 31.9 g/dL (ref 30.0–36.0)
MCV: 93 fL (ref 80.0–100.0)
MCV: 93.2 fL (ref 80.0–100.0)
Platelets: 167 10*3/uL (ref 150–400)
Platelets: 145 10*3/uL — ABNORMAL LOW (ref 150–400)
RBC: 2.3 MIL/uL — ABNORMAL LOW (ref 3.87–5.11)
RBC: 2.93 MIL/uL — ABNORMAL LOW (ref 3.87–5.11)
RDW: 18.2 % — ABNORMAL HIGH (ref 11.5–15.5)
RDW: 18.1 % — ABNORMAL HIGH (ref 11.5–15.5)
WBC: 14.5 10*3/uL — ABNORMAL HIGH (ref 4.0–10.5)
WBC: 16 10*3/uL — ABNORMAL HIGH (ref 4.0–10.5)
nRBC: 0.3 % — ABNORMAL HIGH (ref 0.0–0.2)
nRBC: 0.3 % — ABNORMAL HIGH (ref 0.0–0.2)

## 2019-11-07 LAB — HEPATITIS B SURFACE ANTIBODY,QUALITATIVE: Hep B S Ab: NONREACTIVE

## 2019-11-07 LAB — MRSA PCR SCREENING: MRSA by PCR: NEGATIVE

## 2019-11-07 LAB — PREPARE RBC (CROSSMATCH)

## 2019-11-07 LAB — HEPATITIS B SURFACE ANTIGEN: Hepatitis B Surface Ag: NONREACTIVE

## 2019-11-07 LAB — HEPATITIS B CORE ANTIBODY, TOTAL: Hep B Core Total Ab: NONREACTIVE

## 2019-11-07 MED ORDER — SODIUM CHLORIDE 0.9 % IV BOLUS
250.0000 mL | Freq: Once | INTRAVENOUS | Status: AC
  Administered 2019-11-07: 250 mL via INTRAVENOUS

## 2019-11-07 MED ORDER — ORAL CARE MOUTH RINSE
15.0000 mL | Freq: Two times a day (BID) | OROMUCOSAL | Status: DC
  Administered 2019-11-07 – 2019-11-10 (×8): 15 mL via OROMUCOSAL

## 2019-11-07 MED ORDER — HEPARIN SODIUM (PORCINE) 1000 UNIT/ML DIALYSIS: 2000.0000 [IU] | INTRAMUSCULAR | Status: DC | PRN

## 2019-11-07 MED ORDER — CHLORHEXIDINE GLUCONATE CLOTH 2 % EX PADS
6.0000 | MEDICATED_PAD | Freq: Every day | CUTANEOUS | Status: DC
  Administered 2019-11-07 – 2019-11-09 (×3): 6 via TOPICAL

## 2019-11-07 MED ORDER — SODIUM CHLORIDE 0.9% IV SOLUTION: Freq: Once | INTRAVENOUS | Status: AC

## 2019-11-07 MED ORDER — HEPARIN SODIUM (PORCINE) 1000 UNIT/ML IJ SOLN
INTRAMUSCULAR | Status: AC
  Filled 2019-11-07: qty 4

## 2019-11-07 MED ORDER — CHLORHEXIDINE GLUCONATE CLOTH 2 % EX PADS
6.0000 | MEDICATED_PAD | Freq: Every day | CUTANEOUS | Status: DC
  Administered 2019-11-08 – 2019-11-10 (×3): 6 via TOPICAL

## 2019-11-07 NOTE — Procedures (Signed)
   I was present at this dialysis session, have reviewed the session itself and made  appropriate changes Kelly Splinter MD [place] pager [phone]   11/07/2019, 10:12 AM

## 2019-11-07 NOTE — Progress Notes (Signed)
Post 245ml bolus given patient BP 86/61, HR 87. Patient Asymptomatic. Pahwani, MD notified. No new orders given.

## 2019-11-07 NOTE — Progress Notes (Signed)
   11/07/19 2035  Vitals  Temp 98.1 F (36.7 C)  Temp Source Oral  BP (!) 68/40  MAP (mmHg) (!) 48  BP Location Right Arm  BP Method Automatic  Patient Position (if appropriate) Lying  Pulse Rate 65  Resp (!) 22  Oxygen Therapy  SpO2 95 %  O2 Device Nasal Cannula  O2 Flow Rate (L/min) 2 L/min  MEWS Score  MEWS Temp 0  MEWS Systolic 3  MEWS Pulse 0  MEWS RR 1  MEWS LOC 0  MEWS Score 4  MEWS Score Color Red  MEWS Assessment  Is this an acute change? Yes  MEWS guidelines implemented *See [place]  Provider Notification  Provider Name/Title M. Sharlet Salina, NP  Date Provider Notified 11/07/19  Time Provider Notified 2040  Notification Type Page  Notification Reason Change in status  Response No new orders  Date of Provider Response 11/07/19  Note  Observations  (Pt. asymptomatic,wet cough,crackles upon auscultation)

## 2019-11-07 NOTE — Progress Notes (Signed)
Renal Navigator met with patient again at HD bedside to introduce self and see how she is doing. Patient responds with "ok" to most questions. She reports that she lives with Jaclyn Day, her boyfriend, and that they watch TV and read books. She says he takes good care of her and that he has a car and can take her to dialysis. Renal Navigator explained how important it is that she go to dialysis. She again says that Jaclyn Day can take her. She told Navigator that she and Jaclyn Day have been together for 27 years and that they met at "a rest home." Her speech is difficult to understand at times and she appears to be cognitively delayed and presents as slightly anxious, but very sweet and pleasant. Renal Navigator met with Jaclyn Day in patient's room before she arrived from HD. He appeared nervous and anxious and kept looking over Renal Navigator's shoulder out the door. He fidgeted with his mask, often alternating between sitting and standing. He was pleasant and very talkative. He states he remembers meeting me, though we have never met. Renal Navigator did leave him a message today and he left a message back-this was our first communication. When Renal Navigator first set up patient's OP HD, communication was through CSW/CM as patient discharged to a SNF.  Renal Navigator introduced self and stated involvement due to concern that patient has not been to OP HD since discharging home from SNF approximately 3 weeks ago. Jaclyn Day reports that he did not know anything about patient's need for ongoing HD and that he received no communication from the SNF regarding any of her follow up. He states that she was in isolation for 14 days and that it was very hard on him. He states he is still dealing with how hard that experience was. Renal Navigator asked if the HD clinic has reached out to him and he states he still did not know that patient had a clinic and thought that her dialysis was just while she was in the hospital. Patient returned to  her room and Jaclyn Day patted her on the mouth. He then bent down and kissed her. Renal Navigator plainly stated to both patient and Jaclyn Day that patient is dialysis dependent and will need dialysis for the rest of her life. Renal Navigator explained that it is imperative that patient go to her OP HD clinic 3 times per week at discharge. Renal Navigator informed them of her TTS 11:40 seat time schedule at [facility], but also offered to change her clinic to [place], if this is more convenient to their home in [place]. Jaclyn Day states he knows [place] better than [place], but that he does not feel he can get patient in to his vehicle at this point because she cannot walk at all and is completely wheelchair bound at this point. He also states that he knows that people do dialysis at home and he wants patient to do dialysis at home. Renal Navigator told them this is something they will need to discuss with their outpatient Nephrologist, as it is not something we can set up from the hospital. It requires training and time, and a very cleanly home environment and not all patients are good candidates. Renal Navigator asked if patient makes her own decisions and signs her own consents. Jaclyn Day reports that patient's sister "used to be her guardian, but she doesn't need a guardian anymore." Jaclyn Day reports that they aren't married because she gets Medicaid and was told it would affect her Medicaid if they  got married. He reports that he is her [place].  He reports that she fell again on Saturday, pointing to her bruises on her neck and back and states that she has bruising down her right arm from the fall. He states she fell out of bed. Renal Navigator asked if this is why she came to the hospital. Jaclyn Day states he did not call EMS, they just arrived. He added, "she looked a mess." He states he would have cleaned her up better had he known EMS was coming. Renal Navigator asked for more  detail about what he meant by this. He stated that she had soiled her diaper and needed to be cleaned up when EMS arrived.  Due to concerns with patient's developmental delays, missed HD, bruising, fall without medical follow up and found with feces on her, Renal Navigator made a report to [facility] [employer]. Renal Navigator did not disclose to patient and significant other that report was made. This report will not necessarily impact patient's hospitalization or disposition. They will conduct their investigation and make recommendations/decisions as they see fit whether she is still in the hospital or she has been discharged home.  Renal Navigator will follow closely to continue to provide support to patient in order to arrange OP HD follow up and transportation to clinic at discharge. Jaclyn Day has stated that he has the name of a transportation company that he wants to use at home. He states he will call Navigator tomorrow to follow up.  Alphonzo Cruise, [place] Renal Navigator [phone]

## 2019-11-07 NOTE — Plan of Care (Signed)
  Problem: Education: Goal: Knowledge of General Education information will improve Description Including pain rating scale, medication(s)/side effects and non-pharmacologic comfort measures Outcome: Progressing   

## 2019-11-07 NOTE — Progress Notes (Signed)
PT Cancellation Note  Patient Details Name: Jaclyn Day MRN: [id number] DOB: 1946/01/31   Cancelled Treatment:    Reason Eval/Treat Not Completed: Patient at procedure or test/unavailable(pt at HD, will follow.)  Philomena Doheny PT 11/07/2019  Acute Rehabilitation Services Pager [phone] Office [phone]

## 2019-11-07 NOTE — Progress Notes (Signed)
PROGRESS NOTE    Jaclyn Day  [id number] DOB: 1945-10-19 DOA: 11/10/2019 PCP: Cyndi Bender, PA-C   Brief Narrative:   HPI: Jaclyn Day is a 74 y.o. female with medical history significant of ESRD on HD; afib s/p DCCV 01/2019, on Coumadin; HTN; depression/anxiety; schizoaffective disorder; developmental disorder; and chronic diastolic CHF presenting with missed HD.   Jaclyn Day left rehab and Jaclyn Day couldn't walk and Jaclyn Day needs a wheelchair.  Before Jaclyn Day could use a wheelchair and a walker but now Jaclyn Day can't use her legs.  Jaclyn Day last walked before Jaclyn Day went to rehab, but not while Jaclyn Day was hospitalized.  Jaclyn Day has been a little short of breath.  A little cough.  No chest pain.  No fevers.  No abdominal discomfort.  Jaclyn Day last made urine "the other day."  Jaclyn Day reports that Jaclyn Day wasn't told Jaclyn Day would need HD forever, just "about 4 or 6 days."  Jaclyn Day has been getting shots that have been causing her arms to bruise.  Jaclyn Day denies falling and denies anyone hurting her.  Jaclyn Day is very worried that Jaclyn Day is going to die.  Jaclyn Day also doesn't want to be alone and wants her boyfriend to stay with her or me to.  Jaclyn Day was previously admitted from 10/22-12/13 with cardiac arrest with ROSC.  Jaclyn Day was found to have new systolic CHF with EF 0000000 with recovery to 55-60% while hospitalized; MSSA bacteremia; afib with RVR; AKI treated with HD; and multiple acute brain infarcts and started on valproic acid for stroke-associated seizure prophylaxis.  ED Course:   Needs HD, maybe not immediately.  Jaclyn Day left SNF and went home with boyfriend and HD was inadvertently dropped.  Jaclyn Day looks quite dry.  BP borderline, given a small bolus.  Has pleural effusions.  Assessment & Plan:   Principal Problem:   Non-compliance with renal dialysis ([place]) Active Problems:   CHF (congestive heart failure) (HCC)   Hypertension   Persistent atrial fibrillation (HCC)   MSSA bacteremia   Hyperlipidemia   Schizoaffective disorder (HCC)   Abnormal  thyroid function test   ESRD (end stage renal disease) ([place])   Noncompliance with medical treatment/missed several HD sessions:  -Patient with recent complicated hospitalization for cardiac arrest, resulting in transition from advanced CKD to ESRD -Jaclyn Day was started on HD, which was continued upon placement in SNF -Unfortunately, after d/c from SNF this appears to have been dropped -Jaclyn Day has not been dialyzed since 1/5 -Jaclyn Day apparently went to her PCP Friday for blood testing and results today indicated need for HD and Jaclyn Day was sent to the ER -When EMS arrived at her home, Jaclyn Day was reportedly covered in feces and bruises -Bruising appears to be related to [facility] placement previously.  Patient was admitted for HD.  Nephrology consulted.  Jaclyn Day received her first HD session today.  Anemia of chronic disease: Baseline hemoglobin around 8.3.  Came in yesterday with 7.6 which dropped to 6.8.  Received 2 units of PRBC transfusion with her dialysis today..  H&H later in tomorrow.  Essential hypertension: Blood pressure on the low side.  Hold Lopressor.  Monitor closely.  Chronic atrial fibrillation: Rate is fairly controlled.  Continue amiodarone and Coumadin per pharmacy.  Chronic diastolic CHF: -[id number] Echo with EF 20-25% and severe global hypokinesis after admission for cardiac arrest with CPR -Unchanged on 10/26 -By 11/9 EF had normalized Jaclyn Day appears euvolemic.  Recent MSSA bacteremia -Treated -No current concern for infectious etiology  Subclinical hypothyroidism: No need to treat. -TSH elevated on  12/28 -Will recheck now with free T4  Cognitive impairment -Developmental delay, schizoaffective d/o both listed as possible diagnoses -Continue Abilify  Recent cardiac arrest, CVA -Appears to be improved medically but deconditioned -PT/OT consults requested -May require more prolonged SNF rehab -Continue Valproic acid  DVT prophylaxis: Coumadin Code Status: DNR, present on  admission Family Communication:  None present at bedside.  Disposition Plan: To be determined.  To be seen by PT OT.  Most likely Jaclyn Day may need SNF.  Estimated body mass index is 31.64 kg/m as calculated from the following:   Height as of 10/16/19: 5\' 6"  (1.676 m).   Weight as of this encounter: 88.9 kg.  Pressure Injury 11/05/2019 Pretibial Right;Lateral Unstageable - Full thickness tissue loss in which the base of the injury is covered by slough (yellow, tan, gray, green or brown) and/or eschar (tan, brown or black) in the wound bed. (Active)  10/13/2019 2100  Location: Pretibial  Location Orientation: Right;Lateral  Staging: Unstageable - Full thickness tissue loss in which the base of the injury is covered by slough (yellow, tan, gray, green or brown) and/or eschar (tan, brown or black) in the wound bed.  Wound Description (Comments):   Present on Admission: Yes     Pressure Injury 11/04/2019 Pretibial Right;Lateral Unstageable - Full thickness tissue loss in which the base of the injury is covered by slough (yellow, tan, gray, green or brown) and/or eschar (tan, brown or black) in the wound bed. (Active)  10/16/2019 2100  Location: Pretibial  Location Orientation: Right;Lateral  Staging: Unstageable - Full thickness tissue loss in which the base of the injury is covered by slough (yellow, tan, gray, green or brown) and/or eschar (tan, brown or black) in the wound bed.  Wound Description (Comments):   Present on Admission:      Pressure Injury 10/29/2019 Heel Left;Lateral Stage 2 -  Partial thickness loss of dermis presenting as a shallow open injury with a red, pink wound bed without slough. (Active)  11/01/2019 2100  Location: Heel  Location Orientation: Left;Lateral  Staging: Stage 2 -  Partial thickness loss of dermis presenting as a shallow open injury with a red, pink wound bed without slough.  Wound Description (Comments):   Present on Admission: Yes     Pressure Injury 11/05/2019 Buttocks  Right;Lower Stage 2 -  Partial thickness loss of dermis presenting as a shallow open injury with a red, pink wound bed without slough. (Active)  11/07/2019 2100  Location: Buttocks  Location Orientation: Right;Lower  Staging: Stage 2 -  Partial thickness loss of dermis presenting as a shallow open injury with a red, pink wound bed without slough.  Wound Description (Comments):   Present on Admission: Yes     Nutritional status:               Consultants:   Nephrology  Procedures:   HD  Antimicrobials:   None   Subjective: Seen and examined while having dialysis.  Jaclyn Day denied having any complaints.  Objective: Vitals:   11/07/19 1200 11/07/19 1219 11/07/19 1230 11/07/19 1234  BP: (!) 92/47 (!) 94/54 (!) 100/48 (!) 100/48  Pulse: (!) 110 (!) 104 (!) 119 (!) 115  Resp: (!) 32 (!) 39 (!) 36 (!) 35  Temp: (!) 97.5 F (36.4 C) (!) 97.5 F (36.4 C)  (!) 97.5 F (36.4 C)  TempSrc: Oral Oral Oral   SpO2:      Weight:        Intake/Output Summary (Last 24  hours) at 11/07/2019 1344 Last data filed at 11/07/2019 0656 Gross per 24 hour  Intake 722.13 ml  Output 200 ml  Net 522.13 ml   Filed Weights   11/07/19 0841  Weight: 88.9 kg    Examination:  General exam: Appears calm and comfortable  Respiratory system: Clear to auscultation. Respiratory effort normal. Cardiovascular system: S1 & S2 heard, RRR. No JVD, murmurs, rubs, gallops or clicks. No pedal edema. Gastrointestinal system: Abdomen is nondistended, soft and nontender. No organomegaly or masses felt. Normal bowel sounds heard. Central nervous system: Alert and oriented. No focal neurological deficits. Extremities: Symmetric 5 x 5 power. Skin: No rashes, lesions or ulcers Psychiatry: Judgement and insight appear poor. Mood & affect flat.   Data Reviewed: I have personally reviewed following labs and imaging studies  CBC: Recent Labs  Lab 10/24/2019 1345 11/12/2019 1428 11/07/19 0553  WBC 15.0*  --   14.5*  NEUTROABS 11.1*  --   --   HGB 7.6* 8.5* 6.8*  HCT 26.0* 25.0* 21.4*  MCV 99.2  --  93.0  PLT 218  --  [id number]   Basic Metabolic Panel: Recent Labs  Lab 11/10/2019 1345 10/24/2019 1428 11/07/19 0553  NA 142 138 138  K 4.5 4.4 5.4*  CL 108 108 106  CO2 16*  --  13*  GLUCOSE 126* 118* 81  BUN 109* 115* 114*  CREATININE 8.01* 8.30* 8.14*  CALCIUM 8.9  --  8.2*   GFR: Estimated Creatinine Clearance: 7 mL/min (A) (by C-G formula based on SCr of 8.14 mg/dL (H)). Liver Function Tests: Recent Labs  Lab 10/22/2019 1345  AST 11*  ALT 11  ALKPHOS 75  BILITOT 0.3  PROT 6.5  ALBUMIN 2.5*   No results for input(s): LIPASE, AMYLASE in the last 168 hours. No results for input(s): AMMONIA in the last 168 hours. Coagulation Profile: Recent Labs  Lab 11/10/2019 1836  INR 2.3*   Cardiac Enzymes: No results for input(s): CKTOTAL, CKMB, CKMBINDEX, TROPONINI in the last 168 hours. BNP (last 3 results) No results for input(s): PROBNP in the last 8760 hours. HbA1C: No results for input(s): HGBA1C in the last 72 hours. CBG: No results for input(s): GLUCAP in the last 168 hours. Lipid Profile: No results for input(s): CHOL, HDL, LDLCALC, TRIG, CHOLHDL, LDLDIRECT in the last 72 hours. Thyroid Function Tests: Recent Labs    11/03/2019 1836  TSH 8.797*  FREET4 0.86   Anemia Panel: No results for input(s): VITAMINB12, FOLATE, FERRITIN, TIBC, IRON, RETICCTPCT in the last 72 hours. Sepsis Labs: No results for input(s): PROCALCITON, LATICACIDVEN in the last 168 hours.  Recent Results (from the past 240 hour(s))  Urine culture     Status: Abnormal (Preliminary result)   Collection Time: 10/28/2019  4:07 PM   Specimen: Urine, Catheterized  Result Value Ref Range Status   Specimen Description URINE, CATHETERIZED  Final   Special Requests NONE  Final   Culture (A)  Final    >=100,000 COLONIES/mL GRAM NEGATIVE RODS IDENTIFICATION AND SUSCEPTIBILITIES TO FOLLOW CULTURE REINCUBATED FOR  BETTER GROWTH Performed at [facility], [address], [place], [place] [address]    Report Status PENDING  Incomplete  Respiratory Panel by RT PCR (Flu A&B, Covid) - Nasopharyngeal Swab     Status: None   Collection Time: 10/19/2019  4:27 PM   Specimen: Nasopharyngeal Swab  Result Value Ref Range Status   SARS Coronavirus 2 by RT PCR NEGATIVE NEGATIVE Final    Comment: (NOTE) SARS-CoV-2  target nucleic acids are NOT DETECTED. The SARS-CoV-2 RNA is generally detectable in upper respiratoy specimens during the acute phase of infection. The lowest concentration of SARS-CoV-2 viral copies this assay can detect is 131 copies/mL. A negative result does not preclude SARS-Cov-2 infection and should not be used as the sole basis for treatment or other patient management decisions. A negative result may occur with  improper specimen collection/handling, submission of specimen other than nasopharyngeal swab, presence of viral mutation(s) within the areas targeted by this assay, and inadequate number of viral copies (<131 copies/mL). A negative result must be combined with clinical observations, patient history, and epidemiological information. The expected result is Negative. Fact Sheet for Patients:  PinkCheek.be Fact Sheet for Healthcare Providers:  GravelBags.it This test is not yet ap proved or cleared by the [place] FDA and  has been authorized for detection and/or diagnosis of SARS-CoV-2 by FDA under an Emergency Use Authorization (EUA). This EUA will remain  in effect (meaning this test can be used) for the duration of the COVID-19 declaration under Section 564(b)(1) of the Act, 21 U.S.C. section 360bbb-3(b)(1), unless the authorization is terminated or revoked sooner.    Influenza A by PCR NEGATIVE NEGATIVE Final   Influenza B by PCR NEGATIVE NEGATIVE Final    Comment: (NOTE) The Xpert Xpress SARS-CoV-2/FLU/RSV  assay is intended as an aid in  the diagnosis of influenza from Nasopharyngeal swab specimens and  should not be used as a sole basis for treatment. Nasal washings and  aspirates are unacceptable for Xpert Xpress SARS-CoV-2/FLU/RSV  testing. Fact Sheet for Patients: PinkCheek.be Fact Sheet for Healthcare Providers: GravelBags.it This test is not yet approved or cleared by the [place] FDA and  has been authorized for detection and/or diagnosis of SARS-CoV-2 by  FDA under an Emergency Use Authorization (EUA). This EUA will remain  in effect (meaning this test can be used) for the duration of the  Covid-19 declaration under Section 564(b)(1) of the Act, 21  U.S.C. section 360bbb-3(b)(1), unless the authorization is  terminated or revoked. Performed at [facility], [place] [address], [place], [place] [address]   MRSA PCR Screening     Status: None   Collection Time: 11/07/19  4:32 AM   Specimen: Nasal Mucosa; Nasopharyngeal  Result Value Ref Range Status   MRSA by PCR NEGATIVE NEGATIVE Final    Comment:        The GeneXpert MRSA Assay (FDA approved for NASAL specimens only), is one component of a comprehensive MRSA colonization surveillance program. It is not intended to diagnose MRSA infection nor to guide or monitor treatment for MRSA infections. Performed at [facility], [place] [address] [place], [place] [address]       Radiology Studies: DG Chest Port 1 View  Result Date: 10/15/2019 CLINICAL DATA:  Bruising to right shoulder. EXAM: PORTABLE CHEST 1 VIEW COMPARISON:  Chest x-ray dated August 31, 2019. FINDINGS: Unchanged tunneled right internal jugular dialysis catheter. Stable cardiomegaly. Improved bilateral pleural effusions, now small. Improved aeration at the right lung base. Persistent left basilar atelectasis. No pneumothorax. Healing left rib fractures. No acute osseous abnormality. IMPRESSION:  Improved bilateral pleural effusions, now small. Electronically Signed   By: Titus Dubin M.D.   On: 10/14/2019 14:17    Scheduled Meds: . sodium chloride   Intravenous Once  . amiodarone  200 mg Oral Daily  . apixaban  2.5 mg Oral BID  . ARIPiprazole  10 mg Oral BID  .  atorvastatin  20 mg Oral Daily  . brimonidine  1 drop Both Eyes BID  . Chlorhexidine Gluconate Cloth  6 each Topical Daily  . Chlorhexidine Gluconate Cloth  6 each Topical Q0600  . dorzolamide-timolol  1 drop Both Eyes BID  . feeding supplement (NEPRO CARB STEADY)  237 mL Oral BID BM  . feeding supplement (PRO-STAT SUGAR FREE 64)  30 mL Oral Daily  . heparin      . mouth rinse  15 mL Mouth Rinse BID  . sodium chloride flush  3 mL Intravenous Q12H  . valproic acid  250 mg Oral Q6H   Continuous Infusions:   LOS: 0 days   Time spent: 35 minutes   Darliss Cheney, MD [facility]  11/07/2019, 1:44 PM   To contact the attending provider between 7A-7P or the covering provider during after hours 7P-7A, please log into the web site [email or web address].

## 2019-11-07 NOTE — Progress Notes (Signed)
Renal Navigator reviewed chart and spoke with Nephrologist/Dr. Jonnie Finner. Renal Navigator is concerned about patient not going to OP HD since discharge from SNF earlier this month. Renal Navigator spoke with Education officer, museum at [facility] who states clinic staff spoke with patient's significant other/[name] Stutts who reported he did not bring her because "she didn't need dialysis anymore." Renal Navigator attempted to call Mr. Weston Settle, but he did not answer. Renal Navigator left a message requesting a returned call. Renal Navigator attempted to speak with patient at HD bedside, but she reported that "my diaper is all messed up," so Navigator will meet with her at a later time. Renal Navigator to follow closely.  Alphonzo Cruise, [place] Renal Navigator [phone]

## 2019-11-07 NOTE — Progress Notes (Signed)
[facility] Progress Note  Subjective: no c/o, on HD  Vitals:   11/07/19 0652 11/07/19 0841 11/07/19 0845 11/07/19 0900  BP: (!) 86/61 (!) (P) 90/49 (!) (P) 81/46 (!) (P) 77/50  Pulse: 87 (P) 87 (P) 93 (P) 89  Resp:  (!) (P) 31 (!) (P) 33 (!) (P) 30  Temp:  (P) 97.7 F (36.5 C)    TempSrc:  (P) Oral    SpO2:      Weight:  (P) 88.9 kg      Exam: Gen chron ill elderly female No jvd or bruits Chest clear bilat to bases RRR no MRG Abd soft ntnd no mass or ascites +bs MS no joint effusions or deformity Ext trace LE edema, RIJ TDC, +bruises R>L UE's Neuro is alert, Ox 3 , nf. gen weak    Home meds:  - amiodarone/ warfarin  - valproic acid/ aripiprazole 10 bid  - atorvastatin 20  - metoprolol 12.5 bid   - prn's/ vitamins/ supplements     [facility]pt HD: [facility]   4h  400/800   74.5kg  2/2 bath  TDC  Hep 2300   venofer 50 qwk   mircera 100 ug q2, last 1/3   last HD 10/17/19    Assessment/ Plan: 1. Missed dialysis/ ESRD / uremia / azotemia - pt missed 3 wks of HD after dc from SNF to home early Jan. Mild uremia. Is on HD this am.  Overall prognosis long-term HD may be difficult for this patient w/ chronic psych and cognitive issues, not to mention chronic debility/ deconditioned state. Will ask our SW to look into home situation.  2. HTN - bp's low, holding lopressor 3. Atrial fib - rate controlled on amio, holding BB 4. Recent MSSA bacteremia - treated, no sx's of recurrence 5. Cognitive impairment - cont abilify 6. Recent cardiac arrest/ CVA - improved, but deconditioned. PT/OT consulted per pmd 7. DNR       Kelly Splinter 11/07/2019, 10:05 AM  Inpatient medications: . amiodarone  200 mg Oral Daily  . apixaban  2.5 mg Oral BID  . ARIPiprazole  10 mg Oral BID  . atorvastatin  20 mg Oral Daily  . brimonidine  1 drop Both Eyes BID  . Chlorhexidine Gluconate Cloth  6 each Topical Daily  . Chlorhexidine Gluconate Cloth  6 each Topical Q0600  .  dorzolamide-timolol  1 drop Both Eyes BID  . feeding supplement (NEPRO CARB STEADY)  237 mL Oral BID BM  . feeding supplement (PRO-STAT SUGAR FREE 64)  30 mL Oral Daily  . mouth rinse  15 mL Mouth Rinse BID  . sodium chloride flush  3 mL Intravenous Q12H  . valproic acid  250 mg Oral Q6H    acetaminophen **OR** acetaminophen, calcium carbonate (dosed in mg elemental calcium), camphor-menthol **AND** hydrOXYzine, docusate sodium, feeding supplement (NEPRO CARB STEADY), heparin, ondansetron **OR** ondansetron (ZOFRAN) IV, sorbitol, zolpidem

## 2019-11-08 ENCOUNTER — Inpatient Hospital Stay (HOSPITAL_COMMUNITY): Payer: 59

## 2019-11-08 DIAGNOSIS — Z008 Encounter for other general examination: Secondary | ICD-10-CM

## 2019-11-08 LAB — TYPE AND SCREEN
ABO/RH(D): O POS
Antibody Screen: NEGATIVE
Unit division: 0
Unit division: 0

## 2019-11-08 LAB — BPAM RBC
Blood Product Expiration Date: 202102102359
Blood Product Expiration Date: 202102102359
ISSUE DATE / TIME: 202101261213
ISSUE DATE / TIME: 202101261810
Unit Type and Rh: 5100
Unit Type and Rh: 5100

## 2019-11-08 LAB — URINE CULTURE: Culture: >=100000 — AB

## 2019-11-08 LAB — CBC
HCT: 29.9 % — ABNORMAL LOW (ref 36.0–46.0)
Hemoglobin: 9.5 g/dL — ABNORMAL LOW (ref 12.0–15.0)
MCH: 30.2 pg (ref 26.0–34.0)
MCHC: 31.8 g/dL (ref 30.0–36.0)
MCV: 94.9 fL (ref 80.0–100.0)
Platelets: 139 10*3/uL — ABNORMAL LOW (ref 150–400)
RBC: 3.15 MIL/uL — ABNORMAL LOW (ref 3.87–5.11)
RDW: 18 % — ABNORMAL HIGH (ref 11.5–15.5)
WBC: 18.4 10*3/uL — ABNORMAL HIGH (ref 4.0–10.5)
nRBC: 0.2 % (ref 0.0–0.2)

## 2019-11-08 LAB — BASIC METABOLIC PANEL
Anion gap: 15 (ref 5–15)
BUN: 34 mg/dL — ABNORMAL HIGH (ref 8–23)
CO2: 21 mmol/L — ABNORMAL LOW (ref 22–32)
Calcium: 8.2 mg/dL — ABNORMAL LOW (ref 8.9–10.3)
Chloride: 99 mmol/L (ref 98–111)
Creatinine, Ser: 3.62 mg/dL — ABNORMAL HIGH (ref 0.44–1.00)
GFR calc Af Amer: 14 mL/min — ABNORMAL LOW (ref >60)
GFR calc non Af Amer: 12 mL/min — ABNORMAL LOW (ref >60)
Glucose, Bld: 71 mg/dL (ref 70–99)
Potassium: 3.2 mmol/L — ABNORMAL LOW (ref 3.5–5.1)
Sodium: 135 mmol/L (ref 135–145)

## 2019-11-08 MED ORDER — SODIUM CHLORIDE 0.9 % IV BOLUS
500.0000 mL | Freq: Once | INTRAVENOUS | Status: AC
  Administered 2019-11-08: 500 mL via INTRAVENOUS

## 2019-11-08 MED ORDER — POTASSIUM CHLORIDE CRYS ER 20 MEQ PO TBCR
20.0000 meq | EXTENDED_RELEASE_TABLET | Freq: Two times a day (BID) | ORAL | Status: AC
  Administered 2019-11-08 (×2): 20 meq via ORAL
  Filled 2019-11-08 (×2): qty 1

## 2019-11-08 MED ORDER — CHLORHEXIDINE GLUCONATE CLOTH 2 % EX PADS
6.0000 | MEDICATED_PAD | Freq: Every day | CUTANEOUS | Status: DC
  Administered 2019-11-09 – 2019-11-10 (×2): 6 via TOPICAL

## 2019-11-08 MED ORDER — GUAIFENESIN ER 600 MG PO TB12: 600.0000 mg | ORAL_TABLET | Freq: Two times a day (BID) | ORAL | Status: DC | PRN

## 2019-11-08 MED ORDER — SODIUM CHLORIDE 0.9 % IV SOLN
1.0000 g | INTRAVENOUS | Status: DC
  Administered 2019-11-08 – 2019-11-10 (×3): 1 g via INTRAVENOUS
  Filled 2019-11-08: qty 1
  Filled 2019-11-08: qty 10
  Filled 2019-11-08: qty 1
  Filled 2019-11-08: qty 10

## 2019-11-08 MED ORDER — SODIUM CHLORIDE 0.9 % NICU IV INFUSION SIMPLE
500.0000 mL | INJECTION | Freq: Once | INTRAVENOUS | Status: AC
  Administered 2019-11-08: 500 mL via INTRAVENOUS
  Filled 2019-11-08: qty 500

## 2019-11-08 MED ORDER — IPRATROPIUM-ALBUTEROL 0.5-2.5 (3) MG/3ML IN SOLN: 3.0000 mL | RESPIRATORY_TRACT | Status: DC | PRN

## 2019-11-08 NOTE — Progress Notes (Signed)
Received call yesterday late afternoon from [place]. Agency advised that patient's scope of care is beyond home health capacity. Stated that there are multiple dogs in the home that run all over the patient who is not able to manage the dogs adding that the dogs have caused multiple bruising to patient's upper body. Also stated that significant other was also not able to manage the animals. NCM aware of APS report today. TOC team following for transitions.   Manya Silvas, RN MSN CCM [facility] [phone]

## 2019-11-08 NOTE — Plan of Care (Signed)
  Problem: Nutrition: Goal: Adequate nutrition will be maintained Outcome: Progressing   

## 2019-11-08 NOTE — Progress Notes (Addendum)
Adult Protective Services report has been accepted and assigned to [employer]. Renal Navigator spoke at length with worker and will update her with PT/OT recommendations as requested. She also requests that patient be evaluated for capacity by Psychiatry. Nephrologist ordered Psychiatry consult. Renal Navigator received message from patient's significant other/[name] with phone numbers for potential wheelchair transportation for OP HD clinic, however, per APS worker, we will hold off on arranging this at this time.   Alphonzo Cruise, [place] Renal Navigator [phone]

## 2019-11-08 NOTE — Progress Notes (Signed)
Pt. VS post 260ml bolus - BP 88/61, HR 96, temp 98.5, RR 20, O2 100%. Patient asymptomatic w/ wet cough and crackles upon auscultation. M. Sharlet Salina, NP notified. No new orders given. Will continue to monitor.

## 2019-11-08 NOTE — Progress Notes (Signed)
Alerted by nursing team that BP is currently 75/60 manual. Went to see pt, reports feeling fine other than cough. CXR today showed worsening of R pleural effusion. No pulm edema. No LE edema noted. Decreased breath sounds in RLL. +transmitted upperairway noises. No resp distress. Pleasantly demented. Rocephin was recently started for UTI. Palliative care consulted. BF was updated.   Plan:  Duoneb q 4 hrs prn wheeze; neb solution should help w wheezing Mucinex q 12 hrs prn secretions Bolus 500 mL NS over 1 hr Prognosis is not good, she is DNR  Jaclyn Day 4:21 PM TRH

## 2019-11-08 NOTE — Progress Notes (Signed)
PT Cancellation Note  Patient Details Name: Jaclyn Day MRN: [id number] DOB: 08/02/1946   Cancelled Treatment:    Reason Eval/Treat Not Completed: Medical issues which prohibited therapy.  Attempted to see pt and noted supine BP was 59/41.  Will hold therapy until pt can medically tolerate, retry as time and pt can allow.   Ramond Dial 11/08/2019, 12:57 PM   Mee Hives, PT MS Acute Rehab Dept. Number: [place] [facility] [place]

## 2019-11-08 NOTE — Progress Notes (Addendum)
[facility] Progress Note  Subjective: seen in room, having wet gurgling cough and difficult to understand  Vitals:   11/08/19 0008 11/08/19 0013 11/08/19 0552 11/08/19 0933  BP: (!) 88/61  94/74 (!) 78/57  Pulse: 69 96 89 (!) 105  Resp: (!) 21 20 18 18   Temp: 98.5 F (36.9 C)  (!) 97.5 F (36.4 C) (!) 97.5 F (36.4 C)  TempSrc: Oral  Oral Oral  SpO2: 92% 100% 95% 94%  Weight:        Exam: Gen chron ill elderly female No jvd or bruits Chest clear bilat to bases RRR no MRG Abd soft ntnd no mass or ascites +bs MS no joint effusions or deformity Ext trace LE edema, RIJ TDC, +bruises R>L UE's Neuro is alert, Ox 3 , nf. gen weak    Home meds:  - amiodarone/ warfarin  - valproic acid/ aripiprazole 10 bid  - atorvastatin 20  - metoprolol 12.5 bid   - prn's/ vitamins/ supplements     [facility] HD[facility]   4h  400/800   74.5kg  2/2 bath  TDC  Hep 2300   venofer 50 qwk   mircera 100 ug q2, last 1/3   last HD 10/17/19    Assessment/ Plan: 1. Missed dialysis/ ESRD / uremia / azotemia - pt missed 3 wks of HD after dc from SNF to home early Jan. Admitted [facility] and had HD. Pt is extremely debilitated and may not be strong enough for long-term dialysis, this was a concern back in November before she was started. Would recommend pall care consult for [place].  Will do HD tomorrow if BP will allow.  2. HTN - bp's low, holding lopressor 3. UTI - consider Rocephin or some other IV abx 4. Atrial fib - rate controlled on amio, holding BB 5. Recent MSSA bacteremia - treated, no sx's of recurrence 6. Cognitive impairment - cont abilify, seen by psychiatry, not competent to make her own decisions 7. Recent cardiac arrest/ CVA - improved, but deconditioned. PT/OT consulted per pmd 8. DNR       Kelly Splinter 11/08/2019, 2:56 PM  Inpatient medications: . amiodarone  200 mg Oral Daily  . apixaban  2.5 mg Oral BID  . ARIPiprazole  10 mg Oral BID  . atorvastatin   20 mg Oral Daily  . brimonidine  1 drop Both Eyes BID  . Chlorhexidine Gluconate Cloth  6 each Topical Daily  . Chlorhexidine Gluconate Cloth  6 each Topical Q0600  . dorzolamide-timolol  1 drop Both Eyes BID  . feeding supplement (NEPRO CARB STEADY)  237 mL Oral BID BM  . feeding supplement (PRO-STAT SUGAR FREE 64)  30 mL Oral Daily  . mouth rinse  15 mL Mouth Rinse BID  . potassium chloride  20 mEq Oral BID  . sodium chloride flush  3 mL Intravenous Q12H  . valproic acid  250 mg Oral Q6H    acetaminophen **OR** acetaminophen, calcium carbonate (dosed in mg elemental calcium), camphor-menthol **AND** hydrOXYzine, docusate sodium, feeding supplement (NEPRO CARB STEADY), ondansetron **OR** ondansetron (ZOFRAN) IV, sorbitol, zolpidem

## 2019-11-08 NOTE — Consult Note (Signed)
Telepsych Consultation   Reason for Consult: "Capacity" Referring Physician: Dr. Nani Ravens Location of Patient: [facility] 69[facility] 22 Location of Provider: [facility]  Patient Identification: KIEONNA TESON MRN:  [id number] Principal Diagnosis: Non-compliance with renal dialysis [facility]) Diagnosis:  Principal Problem:   Non-compliance with renal dialysis ([place]) Active Problems:   CHF (congestive heart failure) ([place])   Hypertension   Persistent atrial fibrillation ([place])   MSSA bacteremia   Hyperlipidemia   Schizoaffective disorder ([place])   ESRD (end stage renal disease) on dialysis ([place])   Total Time spent with patient: 30 minutes  Subjective:   RAYAHNA VIEN is a 74 y.o. female patient admitted with noncompliance with renal dialysis.  Patient assessed by nurse practitioner.  Patient alert and oriented, answers appropriately.  Patient denies suicidal and homicidal ideations.  Patient denies auditory visual hallucinations.  Patient denies history of self-harm, denies history of mental health diagnoses.  Patient denies history of alcohol and substance use.  Patient denies access to weapons.  Patient reports she lives with her "boyfriend, Konrad Dolores" of 27 years. Patient appears to have poor insight at this time.  Patient reports "I am in the hospital because I have a heart attack."  Patient states "I do not have any risk for going home, I do not need or want to go to rehabilitation, if I do I would never go back home again."  Attempted to discuss risks of refusing treatment at discharge, patient unable to discuss risk of refusing treatment at this time. Patient refuses to give verbal consent for collection of collateral information at this time.  HPI: Patient admitted, noncompliance with renal dialysis.  Past Psychiatric History: Denies, per chart schizoaffective disorder  Risk to Self:  Denies Risk to Others:  Denies Prior Inpatient Therapy:  Denies Prior Outpatient Therapy:   Yes  Past Medical History:  Past Medical History:  Diagnosis Date  . Anemia   . Chronic diastolic CHF (congestive heart failure) ([place])   . CKD (chronic kidney disease), stage IV ([place])   . Hypertension   . Persistent atrial fibrillation ([place])    a. dx 01/2019 s/p TEE DCCV.  Marland Kitchen Renal disorder   . Unilateral congenital absence of kidney     Past Surgical History:  Procedure Laterality Date  . CARDIOVERSION N/A 01/31/2019   Procedure: CARDIOVERSION;  Surgeon: Lelon Perla, MD;  Location: [facility] ENDOSCOPY;  Service: Cardiovascular;  Laterality: N/A;  . IR FLUORO GUIDE CV LINE RIGHT  08/23/2019  . IR US GUIDE VASC ACCESS RIGHT  08/23/2019  . TEE WITHOUT CARDIOVERSION N/A 01/31/2019   Procedure: TRANSESOPHAGEAL ECHOCARDIOGRAM (TEE);  Surgeon: Lelon Perla, MD;  Location: [facility] ENDOSCOPY;  Service: Cardiovascular;  Laterality: N/A;   Family History:  Family History  Problem Relation Age of Onset  . Atrial fibrillation Father   . Atrial fibrillation Brother    Family Psychiatric  History: Denies Social History:  Social History   Substance and Sexual Activity  Alcohol Use Not Currently     Social History   Substance and Sexual Activity  Drug Use Not Currently    Social History   Socioeconomic History  . Marital status: Single    Spouse name: Not on file  . Number of children: Not on file  . Years of education: Not on file  . Highest education level: Not on file  Occupational History  . Not on file  Tobacco Use  . Smoking status: Never Smoker  . Smokeless tobacco: Never Used  Substance and Sexual Activity  . Alcohol use: Not Currently  . Drug use: Not Currently  . Sexual activity: Not on file  Other Topics Concern  . Not on file  Social History Narrative   Lives with her boyfriend in [place]. He is her POA.    Social Determinants of Health   Financial Resource Strain:   . Difficulty of Paying Living Expenses: Not on file  Food Insecurity:   . Worried About  Charity fundraiser in the Last Year: Not on file  . Ran Out of Food in the Last Year: Not on file  Transportation Needs:   . Lack of Transportation (Medical): Not on file  . Lack of Transportation (Non-Medical): Not on file  Physical Activity:   . Days of Exercise per Week: Not on file  . Minutes of Exercise per Session: Not on file  Stress:   . Feeling of Stress : Not on file  Social Connections:   . Frequency of Communication with Friends and Family: Not on file  . Frequency of Social Gatherings with Friends and Family: Not on file  . Attends Religious Services: Not on file  . Active Member of Clubs or Organizations: Not on file  . Attends Archivist Meetings: Not on file  . Marital Status: Not on file   Additional Social History:    Allergies:   Allergies  Allergen Reactions  . Codeine Other (See Comments)    Reaction not recalled by family- was told to never take this    Labs:  Results for orders placed or performed during the hospital encounter of 10/13/2019 (from the past 48 hour(s))  CBC with Differential     Status: Abnormal   Collection Time: 11/09/2019  1:45 PM  Result Value Ref Range   WBC 15.0 (H) 4.0 - 10.5 K/uL   RBC 2.62 (L) 3.87 - 5.11 MIL/uL   Hemoglobin 7.6 (L) 12.0 - 15.0 g/dL   HCT 26.0 (L) 36.0 - 46.0 %   MCV 99.2 80.0 - 100.0 fL   MCH 29.0 26.0 - 34.0 pg   MCHC 29.2 (L) 30.0 - 36.0 g/dL   RDW 18.4 (H) 11.5 - 15.5 %   Platelets 218 150 - 400 K/uL   nRBC 0.2 0.0 - 0.2 %   Neutrophils Relative % 75 %   Neutro Abs 11.1 (H) 1.7 - 7.7 K/uL   Lymphocytes Relative 17 %   Lymphs Abs 2.6 0.7 - 4.0 K/uL   Monocytes Relative 5 %   Monocytes Absolute 0.8 0.1 - 1.0 K/uL   Eosinophils Relative 0 %   Eosinophils Absolute 0.1 0.0 - 0.5 K/uL   Basophils Relative 0 %   Basophils Absolute 0.1 0.0 - 0.1 K/uL   Immature Granulocytes 3 %   Abs Immature Granulocytes 0.42 (H) 0.00 - 0.07 K/uL    Comment: Performed at [facility], [address]  [place], [place] [address]  Comprehensive metabolic panel     Status: Abnormal   Collection Time: 11/12/2019  1:45 PM  Result Value Ref Range   Sodium 142 135 - 145 mmol/L   Potassium 4.5 3.5 - 5.1 mmol/L   Chloride 108 98 - 111 mmol/L   CO2 16 (L) 22 - 32 mmol/L   Glucose, Bld 126 (H) 70 - 99 mg/dL   BUN 109 (H) 8 - 23 mg/dL   Creatinine, Ser 8.01 (H) 0.44 - 1.00 mg/dL   Calcium 8.9 8.9 - 10.3 mg/dL  Total Protein 6.5 6.5 - 8.1 g/dL   Albumin 2.5 (L) 3.5 - 5.0 g/dL   AST 11 (L) 15 - 41 U/L   ALT 11 0 - 44 U/L   Alkaline Phosphatase 75 38 - 126 U/L   Total Bilirubin 0.3 0.3 - 1.2 mg/dL   GFR calc non Af Amer 5 (L) >60 mL/min   GFR calc Af Amer 5 (L) >60 mL/min   Anion gap 18 (H) 5 - 15    Comment: Performed at [facility] [address] [place], [place] [address]  I-stat chem 8, ED (not at [facility] or [facility])     Status: Abnormal   Collection Time: 10/31/2019  2:28 PM  Result Value Ref Range   Sodium 138 135 - 145 mmol/L   Potassium 4.4 3.5 - 5.1 mmol/L   Chloride 108 98 - 111 mmol/L   BUN 115 (H) 8 - 23 mg/dL   Creatinine, Ser 8.30 (H) 0.44 - 1.00 mg/dL   Glucose, Bld 118 (H) 70 - 99 mg/dL   Calcium, Ion 1.07 (L) 1.15 - 1.40 mmol/L   TCO2 17 (L) 22 - 32 mmol/L   Hemoglobin 8.5 (L) 12.0 - 15.0 g/dL   HCT 25.0 (L) 36.0 - 46.0 %  Urinalysis, Routine w reflex microscopic     Status: Abnormal   Collection Time: 11/04/2019  4:07 PM  Result Value Ref Range   Color, Urine YELLOW YELLOW   APPearance TURBID (A) CLEAR   Specific Gravity, Urine 1.019 1.005 - 1.030   pH 7.0 5.0 - 8.0   Glucose, UA NEGATIVE NEGATIVE mg/dL   Hgb urine dipstick NEGATIVE NEGATIVE   Bilirubin Urine NEGATIVE NEGATIVE   Ketones, ur 5 (A) NEGATIVE mg/dL   Protein, ur >=300 (A) NEGATIVE mg/dL   Nitrite NEGATIVE NEGATIVE   Leukocytes,Ua MODERATE (A) NEGATIVE   WBC, UA >50 (H) 0 - 5 WBC/hpf   Bacteria, UA MANY (A) NONE SEEN   Squamous Epithelial / LPF 21-50 0 - 5    Comment: Performed at [facility],  [place] [address] [place], [place] [address]  Urine culture     Status: Abnormal   Collection Time: 10/14/2019  4:07 PM   Specimen: Urine, Catheterized  Result Value Ref Range   Specimen Description URINE, CATHETERIZED    Special Requests      NONE Performed at [facility] [address], [place], [place] [address]    Culture (A)     >=100,000 COLONIES/mL ESCHERICHIA COLI >=100,000 COLONIES/mL PROTEUS MIRABILIS    Report Status 11/08/2019 FINAL    Organism ID, Bacteria ESCHERICHIA COLI (A)    Organism ID, Bacteria PROTEUS MIRABILIS (A)       Susceptibility   Escherichia coli - MIC*    AMPICILLIN 4 SENSITIVE Sensitive     CEFAZOLIN <=4 SENSITIVE Sensitive     CEFTRIAXONE <=0.25 SENSITIVE Sensitive     CIPROFLOXACIN <=0.25 SENSITIVE Sensitive     GENTAMICIN <=1 SENSITIVE Sensitive     IMIPENEM <=0.25 SENSITIVE Sensitive     NITROFURANTOIN <=16 SENSITIVE Sensitive     TRIMETH/SULFA <=20 SENSITIVE Sensitive     AMPICILLIN/SULBACTAM <=2 SENSITIVE Sensitive     PIP/TAZO <=4 SENSITIVE Sensitive     * >=100,000 COLONIES/mL ESCHERICHIA COLI   Proteus mirabilis - MIC*    AMPICILLIN >=32 RESISTANT Resistant     CEFAZOLIN >=64 RESISTANT Resistant     CEFTRIAXONE <=0.25 SENSITIVE Sensitive     CIPROFLOXACIN <=0.25 SENSITIVE Sensitive  GENTAMICIN <=1 SENSITIVE Sensitive     IMIPENEM 2 SENSITIVE Sensitive     NITROFURANTOIN 128 RESISTANT Resistant     TRIMETH/SULFA <=20 SENSITIVE Sensitive     AMPICILLIN/SULBACTAM >=32 RESISTANT Resistant     PIP/TAZO <=4 SENSITIVE Sensitive     * >=100,000 COLONIES/mL PROTEUS MIRABILIS  Respiratory Panel by RT PCR (Flu A&B, Covid) - Nasopharyngeal Swab     Status: None   Collection Time: 10/29/2019  4:27 PM   Specimen: Nasopharyngeal Swab  Result Value Ref Range   SARS Coronavirus 2 by RT PCR NEGATIVE NEGATIVE    Comment: (NOTE) SARS-CoV-2 target nucleic acids are NOT DETECTED. The SARS-CoV-2 RNA is generally detectable in upper  respiratoy specimens during the acute phase of infection. The lowest concentration of SARS-CoV-2 viral copies this assay can detect is 131 copies/mL. A negative result does not preclude SARS-Cov-2 infection and should not be used as the sole basis for treatment or other patient management decisions. A negative result may occur with  improper specimen collection/handling, submission of specimen other than nasopharyngeal swab, presence of viral mutation(s) within the areas targeted by this assay, and inadequate number of viral copies (<131 copies/mL). A negative result must be combined with clinical observations, patient history, and epidemiological information. The expected result is Negative. Fact Sheet for Patients:  PinkCheek.be Fact Sheet for Healthcare Providers:  GravelBags.it This test is not yet ap proved or cleared by the [place] FDA and  has been authorized for detection and/or diagnosis of SARS-CoV-2 by FDA under an Emergency Use Authorization (EUA). This EUA will remain  in effect (meaning this test can be used) for the duration of the COVID-19 declaration under Section 564(b)(1) of the Act, 21 U.S.C. section 360bbb-3(b)(1), unless the authorization is terminated or revoked sooner.    Influenza A by PCR NEGATIVE NEGATIVE   Influenza B by PCR NEGATIVE NEGATIVE    Comment: (NOTE) The Xpert Xpress SARS-CoV-2/FLU/RSV assay is intended as an aid in  the diagnosis of influenza from Nasopharyngeal swab specimens and  should not be used as a sole basis for treatment. Nasal washings and  aspirates are unacceptable for Xpert Xpress SARS-CoV-2/FLU/RSV  testing. Fact Sheet for Patients: PinkCheek.be Fact Sheet for Healthcare Providers: GravelBags.it This test is not yet approved or cleared by the [place] FDA and  has been authorized for detection and/or  diagnosis of SARS-CoV-2 by  FDA under an Emergency Use Authorization (EUA). This EUA will remain  in effect (meaning this test can be used) for the duration of the  Covid-19 declaration under Section 564(b)(1) of the Act, 21  U.S.C. section 360bbb-3(b)(1), unless the authorization is  terminated or revoked. Performed at [facility], [place] [address] [place], [place] [address]   Protime-INR     Status: Abnormal   Collection Time: 11/03/2019  6:36 PM  Result Value Ref Range   Prothrombin Time 25.2 (H) 11.4 - 15.2 seconds   INR 2.3 (H) 0.8 - 1.2    Comment: (NOTE) INR goal varies based on device and disease states. Performed at [facility], [place] [address] [place], [place] [address]   T4, free     Status: None   Collection Time: 10/20/2019  6:36 PM  Result Value Ref Range   Free T4 0.86 0.61 - 1.12 ng/dL    Comment: (NOTE) Biotin ingestion may interfere with free T4 tests. If the results are inconsistent with the TSH level, previous test results, or the clinical presentation, then consider  biotin interference. If needed, order repeat testing after stopping biotin. Performed at [facility], [place] [address] [place], [place] [address]   TSH     Status: Abnormal   Collection Time: 11/05/2019  6:36 PM  Result Value Ref Range   TSH 8.797 (H) 0.350 - 4.500 uIU/mL    Comment: Performed by a 3rd Generation assay with a functional sensitivity of <=0.01 uIU/mL. Performed at [facility], [place] [address], [place], [place] [address]   MRSA PCR Screening     Status: None   Collection Time: 11/07/19  4:32 AM   Specimen: Nasal Mucosa; Nasopharyngeal  Result Value Ref Range   MRSA by PCR NEGATIVE NEGATIVE    Comment:        The GeneXpert MRSA Assay (FDA approved for NASAL specimens only), is one component of a comprehensive MRSA colonization surveillance program. It is not intended to diagnose MRSA infection nor to guide or monitor treatment for MRSA  infections. Performed at [facility], [place] [address] [place], [place] [id number]   Basic metabolic panel     Status: Abnormal   Collection Time: 11/07/19  5:53 AM  Result Value Ref Range   Sodium 138 135 - 145 mmol/L   Potassium 5.4 (H) 3.5 - 5.1 mmol/L   Chloride 106 98 - 111 mmol/L   CO2 13 (L) 22 - 32 mmol/L   Glucose, Bld 81 70 - 99 mg/dL   BUN 114 (H) 8 - 23 mg/dL   Creatinine, Ser 8.14 (H) 0.44 - 1.00 mg/dL   Calcium 8.2 (L) 8.9 - 10.3 mg/dL   GFR calc non Af Amer 4 (L) >60 mL/min   GFR calc Af Amer 5 (L) >60 mL/min   Anion gap 19 (H) 5 - 15    Comment: Performed at [facility] [address], [place] [address]  CBC     Status: Abnormal   Collection Time: 11/07/19  5:53 AM  Result Value Ref Range   WBC 14.5 (H) 4.0 - 10.5 K/uL   RBC 2.30 (L) 3.87 - 5.11 MIL/uL   Hemoglobin 6.8 (LL) 12.0 - 15.0 g/dL    Comment: REPEATED TO VERIFY THIS CRITICAL RESULT HAS VERIFIED AND BEEN CALLED TO DAIN MAGADO, RN BY JULIE MACEDA DEL ANGEL ON 01 26 2021 AT 1023, AND HAS BEEN READ BACK.     HCT 21.4 (L) 36.0 - 46.0 %   MCV 93.0 80.0 - 100.0 fL   MCH 29.6 26.0 - 34.0 pg   MCHC 31.8 30.0 - 36.0 g/dL   RDW 18.2 (H) 11.5 - 15.5 %   Platelets 167 150 - 400 K/uL    Comment: REPEATED TO VERIFY   nRBC 0.3 (H) 0.0 - 0.2 %    Comment: Performed at [facility] [address], [place], [place] [address]  Prepare RBC     Status: None   Collection Time: 11/07/19 10:42 AM  Result Value Ref Range   Order Confirmation      ORDER PROCESSED BY BLOOD BANK Performed at [facility], [place] [address], [place],  [address]   Type and screen [place]     Status: None   Collection Time: 11/07/19 10:42 AM  Result Value Ref Range   ABO/RH(D) O POS    Antibody Screen NEG    Sample Expiration 11/10/2019,2359    Unit Number [id number]    Blood Component Type RED CELLS,LR  Unit division 00    Status of Unit ISSUED,FINAL    Transfusion Status  OK TO TRANSFUSE    Crossmatch Result Compatible    Unit Number [id number]    Blood Component Type RED CELLS,LR    Unit division 00    Status of Unit ISSUED,FINAL    Transfusion Status OK TO TRANSFUSE    Crossmatch Result      Compatible Performed at [facility], [place] [address], [place], [place] [address]   CBC     Status: Abnormal   Collection Time: 11/07/19  5:52 PM  Result Value Ref Range   WBC 16.0 (H) 4.0 - 10.5 K/uL   RBC 2.93 (L) 3.87 - 5.11 MIL/uL   Hemoglobin 8.7 (L) 12.0 - 15.0 g/dL    Comment: REPEATED TO VERIFY POST TRANSFUSION SPECIMEN    HCT 27.3 (L) 36.0 - 46.0 %   MCV 93.2 80.0 - 100.0 fL   MCH 29.7 26.0 - 34.0 pg   MCHC 31.9 30.0 - 36.0 g/dL   RDW 18.1 (H) 11.5 - 15.5 %   Platelets 145 (L) 150 - 400 K/uL   nRBC 0.3 (H) 0.0 - 0.2 %    Comment: Performed at [facility] [address], [place], [place] [address]  Hepatitis B surface antigen     Status: None   Collection Time: 11/07/19  6:28 PM  Result Value Ref Range   Hepatitis B Surface Ag NON REACTIVE NON REACTIVE    Comment: Performed at [facility] [address], [place], [place] [address]  Hepatitis B surface antibody,qualitative     Status: None   Collection Time: 11/07/19  6:28 PM  Result Value Ref Range   Hep B S Ab NON REACTIVE NON REACTIVE    Comment: (NOTE) Inconsistent with immunity, less than 10 mIU/mL. Performed at [facility], [place] [address], [place], [place] [address]   Hepatitis B core antibody, total     Status: None   Collection Time: 11/07/19  6:28 PM  Result Value Ref Range   Hep B Core Total Ab NON REACTIVE NON REACTIVE    Comment: Performed at [facility] [address], [place], [place] [id number]  Basic metabolic panel     Status: Abnormal   Collection Time: 11/08/19  7:05 AM  Result Value Ref Range   Sodium 135 135 - 145 mmol/L   Potassium 3.2 (L) 3.5 - 5.1 mmol/L   Chloride 99 98 - 111 mmol/L   CO2 21 (L) 22 - 32 mmol/L   Glucose,  Bld 71 70 - 99 mg/dL   BUN 34 (H) 8 - 23 mg/dL   Creatinine, Ser 3.62 (H) 0.44 - 1.00 mg/dL    Comment: DELTA CHECK NOTED   Calcium 8.2 (L) 8.9 - 10.3 mg/dL   GFR calc non Af Amer 12 (L) >60 mL/min   GFR calc Af Amer 14 (L) >60 mL/min   Anion gap 15 5 - 15    Comment: Performed at [facility] [address], [place] [address]  CBC     Status: Abnormal   Collection Time: 11/08/19  7:05 AM  Result Value Ref Range   WBC 18.4 (H) 4.0 - 10.5 K/uL   RBC 3.15 (L) 3.87 - 5.11 MIL/uL   Hemoglobin 9.5 (L) 12.0 - 15.0 g/dL   HCT 29.9 (L) 36.0 - 46.0 %   MCV 94.9 80.0 - 100.0 fL  MCH 30.2 26.0 - 34.0 pg   MCHC 31.8 30.0 - 36.0 g/dL   RDW 18.0 (H) 11.5 - 15.5 %   Platelets 139 (L) 150 - 400 K/uL   nRBC 0.2 0.0 - 0.2 %    Comment: Performed at [facility] [address], [place], [place] [address]    Medications:  Current Facility-Administered Medications  Medication Dose Route Frequency Provider Last Rate Last Admin  . acetaminophen (TYLENOL) tablet 650 mg  650 mg Oral Q6H PRN Karmen Bongo, MD       Or  . acetaminophen (TYLENOL) suppository 650 mg  650 mg Rectal Q6H PRN Karmen Bongo, MD      . amiodarone (PACERONE) tablet 200 mg  200 mg Oral Daily Karmen Bongo, MD   200 mg at 11/08/19 1023  . apixaban (ELIQUIS) tablet 2.5 mg  2.5 mg Oral BID Lang Snow, FNP   2.5 mg at 11/08/19 1023  . ARIPiprazole (ABILIFY) tablet 10 mg  10 mg Oral BID Karmen Bongo, MD   10 mg at 11/08/19 1023  . atorvastatin (LIPITOR) tablet 20 mg  20 mg Oral Daily Karmen Bongo, MD   20 mg at 11/08/19 1023  . brimonidine (ALPHAGAN) 0.15 % ophthalmic solution 1 drop  1 drop Both Eyes BID Karmen Bongo, MD   1 drop at 11/08/19 1024  . calcium carbonate (dosed in mg elemental calcium) suspension 500 mg of elemental calcium  500 mg of elemental calcium Oral Q6H PRN Karmen Bongo, MD      . camphor-menthol [facility]) lotion 1 application  1 application Topical [id number] PRN Karmen Bongo, MD       And  . hydrOXYzine (ATARAX/VISTARIL) tablet 25 mg  25 mg Oral Q8H PRN Karmen Bongo, MD      . Chlorhexidine Gluconate Cloth 2 % PADS 6 each  6 each Topical Daily Karmen Bongo, MD   6 each at 11/08/19 1030  . Chlorhexidine Gluconate Cloth 2 % PADS 6 each  6 each Topical Q0600 Roney Jaffe, MD   6 each at 11/08/19 [phone]  . docusate sodium (ENEMEEZ) enema 283 mg  1 enema Rectal PRN Karmen Bongo, MD      . dorzolamide-timolol (COSOPT) 22.3-6.8 MG/ML ophthalmic solution 1 drop  1 drop Both Eyes BID Karmen Bongo, MD   1 drop at 11/08/19 1024  . feeding supplement (NEPRO CARB STEADY) liquid 237 mL  237 mL Oral BID BM Karmen Bongo, MD   237 mL at 11/08/19 1022  . feeding supplement (NEPRO CARB STEADY) liquid 237 mL  237 mL Oral TID PRN Karmen Bongo, MD      . feeding supplement (PRO-STAT SUGAR FREE 64) liquid 30 mL  30 mL Oral Daily Karmen Bongo, MD   30 mL at 11/08/19 1022  . MEDLINE mouth rinse  15 mL Mouth Rinse BID Karmen Bongo, MD   15 mL at 11/08/19 1030  . ondansetron (ZOFRAN) tablet 4 mg  4 mg Oral Q6H PRN Karmen Bongo, MD       Or  . ondansetron [facility]) injection 4 mg  4 mg Intravenous Q6H PRN Karmen Bongo, MD      . potassium chloride SA (KLOR-CON) CR tablet 20 mEq  20 mEq Oral BID Shelda Pal, DO   20 mEq at 11/08/19 1023  . sodium chloride 0.9 % bolus 500 mL  500 mL Intravenous Once Shelda Pal, DO      . sodium chloride flush (NS) 0.9 % injection 3  mL  3 mL Intravenous Lillia Mountain, MD   3 mL at 11/08/19 1022  . sorbitol 70 % solution 30 mL  30 mL Oral PRN Karmen Bongo, MD      . valproic acid (DEPAKENE) 250 MG capsule 250 mg  250 mg Oral Q6H Karmen Bongo, MD   250 mg at 11/08/19 1026  . zolpidem (AMBIEN) tablet 5 mg  5 mg Oral QHS PRN Karmen Bongo, MD   5 mg at 10/26/2019 2322    Musculoskeletal: Strength & Muscle Tone: Unable to assess Gait & Station: Unable to assess Patient leans: Unable to  assess  Psychiatric Specialty Exam: Physical Exam  Nursing note and vitals reviewed. Constitutional: She is oriented to person, place, and time. She appears well-developed.  HENT:  Head: Normocephalic.  Cardiovascular: Normal rate.  Respiratory:  Patient appears labored, patient currently wearing nasal cannula.  Neurological: She is alert and oriented to person, place, and time.  Psychiatric: She has a normal mood and affect. Her speech is normal and behavior is normal. Thought content normal. Cognition and memory are normal. She expresses inappropriate judgment.    Review of Systems  Constitutional: Negative.   HENT: Negative.   Eyes: Negative.   Respiratory: Negative.   Cardiovascular: Negative.   Gastrointestinal: Negative.   Genitourinary: Negative.   Musculoskeletal: Negative.   Skin: Negative.   Neurological: Negative.   Psychiatric/Behavioral: Negative.     Blood pressure (!) 78/57, pulse (!) 105, temperature (!) 97.5 F (36.4 C), temperature source Oral, resp. rate 18, weight 88.9 kg, SpO2 94 %.Body mass index is 31.64 kg/m.  General Appearance: Disheveled  Eye Contact:  Fair  Speech:  Slow  Volume:  Normal  Mood:  Anxious  Affect:  Congruent  Thought Process:  Coherent, Goal Directed and Descriptions of Associations: Intact  Orientation:  Full (Time, Place, and Person)  Thought Content:  Logical  Suicidal Thoughts:  No  Homicidal Thoughts:  No  Memory:  Immediate;   Good Recent;   Good Remote;   Good  Judgement:  Impaired  Insight:  Lacking  Psychomotor Activity:  Normal  Concentration:  Concentration: Fair and Attention Span: Fair  Recall:  [employer] of Knowledge:  Good  Language:  Good  Akathisia:  No  Handed:  Right  AIMS (if indicated):     Assets:  Communication Skills Desire for Improvement Financial Resources/Insurance Housing  ADL's:  Impaired  Cognition:  Impaired,  Moderate  Sleep:        Treatment Plan Summary: Case discussed with  Dr. Dwyane Dee.  Patient does not appear to have capacity to determine disposition at this time.  Please contact next of kin or appropriate hospital administrator to help with decision-making process.  Disposition: No evidence of imminent risk to self or others at present.   Patient does not meet criteria for psychiatric inpatient admission. Supportive therapy provided about ongoing stressors. Discussed crisis plan, support from social network, calling 911, coming to the Emergency Department, and calling Suicide Hotline.  This service was provided via telemedicine using a 2-way, interactive audio and video technology.  Names of all persons participating in this telemedicine service and their role in this encounter. Name: Claretta Fraise Role: Patient  Name: Letitia Libra Role: [place], [place] 11/08/2019 12:27 PM

## 2019-11-08 NOTE — Progress Notes (Addendum)
PROGRESS NOTE    GRICELDA BADUA  [id number] DOB: 11-Dec-1945 DOA: 11/09/2019 PCP: Cyndi Bender, PA-C     Brief Narrative:  "Tanyell Briscoe Buntingis a 74 y.o.femalewith medical history significant ofESRD on HD; afib s/p DCCV 01/2019, on Coumadin; HTN;depression/anxiety;schizoaffective disorder;developmental disorder; and chronic diastolic CHF presenting with missed HD.She left rehab and she couldn't walk and she needs a wheelchair. Before she could use a wheelchair and a walker but now she can't use her legs. She last walked before she went to rehab, but not while she was hospitalized. She has been a little short of breath. A little cough. No chest pain. No fevers. No abdominal discomfort. She last made urine "the other day." She reports that she wasn't told she would need HD forever, just "about 4 or 6 days." She has been getting shots that have been causing her arms to bruise. She denies falling and denies anyone hurting her. She is very worried that she is going to die. She also doesn't want to be alone and wants her boyfriend to stay with her or me to.  She was previously admitted from 10/22-12/13 with cardiac arrest with ROSC. She was found to have new systolic CHF with EF 0000000 with recovery to 55-60% while hospitalized; MSSA bacteremia; afib with RVR; AKI treated with HD; and multiple acute brain infarcts and started on valproic acid for stroke-associated seizure prophylaxis." from Dr. Jacob Moores progress note.    New events last 24 hours / Subjective: Patient reports feeling fine.  She had dialysis yesterday and feels better.  She does not have any shortness of breath or chest pain, no swelling in her extremities.  She is having some coughing though.  No fevers.  PT/OT unable to work with the patient due to drop in blood pressure.  Adult protective services are investigating this case.  Cardiology saw the patient, no further management from them while  inpatient.  Assessment & Plan:   Principal Problem:   Non-compliance with renal dialysis ([place])  Appreciate social work  APS involved  HD yesterday, appreciate nephrology  Nephrology also graciously consulted psychiatry for capacity eval  Not a good HD candidate, will consult palliative care given low BP's today  Active Problems:   Limited capacity  Psych deemed in unfit to make her own decisions    Acute cystitis  Cx shows infxn, will start Rocephin 1 g daily, MIC shows sensitivity to    CHF (congestive heart failure) [facility])  Cardiology has signed off  She was having a cough this AM, will ck CXR     Hypertension  Currently hypotensive  500 cc NS bolus over 1 hr as she does not seem overloaded  Will request manual checks  Hopefully tx'ing UTI will help with this    Persistent atrial fibrillation (HCC)  Continue amiodarone 200 mg daily  Eliquis 2.5 mg daily    Hyperlipidemia  Lipitor 20 mg daily     Schizoaffective disorder (HCC)  Continue Abilify 10 mg twice daily    ESRD (end stage renal disease) on dialysis ([place])  As above    Stroke associated seizure prophylaxis  Continue valproic acid 250 mg every 6 hours    Physical deconditioning  PT/OT  In agreement with assessment of the pressure ulcer as below:  Pressure Injury 11/12/2019 Pretibial Right;Lateral Unstageable - Full thickness tissue loss in which the base of the injury is covered by slough (yellow, tan, gray, green or brown) and/or eschar (tan, brown or black) in the wound  bed. (Active)  10/14/2019 2100  Location: Pretibial  Location Orientation: Right;Lateral  Staging: Unstageable - Full thickness tissue loss in which the base of the injury is covered by slough (yellow, tan, gray, green or brown) and/or eschar (tan, brown or black) in the wound bed.  Wound Description (Comments):   Present on Admission: Yes     Pressure Injury 11/07/2019 Pretibial Right;Lateral Unstageable - Full thickness tissue loss in which  the base of the injury is covered by slough (yellow, tan, gray, green or brown) and/or eschar (tan, brown or black) in the wound bed. (Active)  10/17/2019 2100  Location: Pretibial  Location Orientation: Right;Lateral  Staging: Unstageable - Full thickness tissue loss in which the base of the injury is covered by slough (yellow, tan, gray, green or brown) and/or eschar (tan, brown or black) in the wound bed.  Wound Description (Comments):   Present on Admission:      Pressure Injury 10/17/2019 Heel Left;Lateral Stage 2 -  Partial thickness loss of dermis presenting as a shallow open injury with a red, pink wound bed without slough. (Active)  10/30/2019 2100  Location: Heel  Location Orientation: Left;Lateral  Staging: Stage 2 -  Partial thickness loss of dermis presenting as a shallow open injury with a red, pink wound bed without slough.  Wound Description (Comments):   Present on Admission: Yes     Pressure Injury 10/16/2019 Buttocks Right;Lower Stage 2 -  Partial thickness loss of dermis presenting as a shallow open injury with a red, pink wound bed without slough. (Active)  10/28/2019 2100  Location: Buttocks  Location Orientation: Right;Lower  Staging: Stage 2 -  Partial thickness loss of dermis presenting as a shallow open injury with a red, pink wound bed without slough.  Wound Description (Comments):   Present on Admission: Yes    DVT prophylaxis: Eliquis Code Status: DNR Family Communication: Self, POA/BF [name] Stutts who I spoke with today; he will be in hosp tomorrow around 10 AM which I documented in the palliative care consultation request Disposition Plan: SNF; barriers to d/c include poor family support, hypotension She is coming from home after leaving SNF   Consultants:   Nephrology  Palliative care  Procedures:   HD 1/26   Objective: Vitals:   11/08/19 0008 11/08/19 0013 11/08/19 0552 11/08/19 0933  BP: (!) 88/61  94/74 (!) 78/57  Pulse: 69 96 89 (!) 105  Resp:  (!) 21 20 18 18   Temp: 98.5 F (36.9 C)  (!) 97.5 F (36.4 C) (!) 97.5 F (36.4 C)  TempSrc: Oral  Oral Oral  SpO2: 92% 100% 95% 94%  Weight:        Intake/Output Summary (Last 24 hours) at 11/08/2019 1233 Last data filed at 11/08/2019 0915 Gross per 24 hour  Intake 1505.91 ml  Output 235 ml  Net 1270.91 ml   Filed Weights   11/07/19 0841  Weight: 88.9 kg    Examination: General exam: Appears calm and comfortable  Respiratory system: There are transmitted upper airway noises, otherwise clear to auscultation bilaterally.  No respiratory distress. No conversational dyspnea.  Cardiovascular system: S1 & S2 heard, RRR. No pedal edema. Gastrointestinal system: Abdomen is nondistended, soft and nontender. Normal bowel sounds heard. Central nervous system: Alert and awake. Extremities: Symmetric in appearance  Skin: No rashes, lesions or ulcers on exposed skin  Psychiatry: Judgement and insight appear limited. Mood & affect appropriate.   Data Reviewed: I have personally reviewed following labs and imaging studies  CBC: Recent Labs  Lab 10/29/2019 1345 10/27/2019 1428 11/07/19 0553 11/07/19 1752 11/08/19 0705  WBC 15.0*  --  14.5* 16.0* 18.4*  NEUTROABS 11.1*  --   --   --   --   HGB 7.6* 8.5* 6.8* 8.7* 9.5*  HCT 26.0* 25.0* 21.4* 27.3* 29.9*  MCV 99.2  --  93.0 93.2 94.9  PLT 218  --  167 145* [id number]*   Basic Metabolic Panel: Recent Labs  Lab 10/23/2019 1345 10/31/2019 1428 11/07/19 0553 11/08/19 0705  NA 142 138 138 135  K 4.5 4.4 5.4* 3.2*  CL 108 108 106 99  CO2 16*  --  13* 21*  GLUCOSE 126* 118* 81 71  BUN 109* 115* 114* 34*  CREATININE 8.01* 8.30* 8.14* 3.62*  CALCIUM 8.9  --  8.2* 8.2*   GFR: Estimated Creatinine Clearance: 15.8 mL/min (A) (by C-G formula based on SCr of 3.62 mg/dL (H)). Liver Function Tests: Recent Labs  Lab 10/22/2019 1345  AST 11*  ALT 11  ALKPHOS 75  BILITOT 0.3  PROT 6.5  ALBUMIN 2.5*   Coagulation Profile: Recent Labs  Lab  10/24/2019 1836  INR 2.3*   Thyroid Function Tests: Recent Labs    10/31/2019 1836  TSH 8.797*  FREET4 0.86   Recent Results (from the past 240 hour(s))  Urine culture     Status: Abnormal   Collection Time: 10/22/2019  4:07 PM   Specimen: Urine, Catheterized  Result Value Ref Range Status   Specimen Description URINE, CATHETERIZED  Final   Special Requests   Final    NONE Performed at [facility], [place] [address], [place],  [address]    Culture (A)  Final    >=100,000 COLONIES/mL ESCHERICHIA COLI >=100,000 COLONIES/mL PROTEUS MIRABILIS    Report Status 11/08/2019 FINAL  Final   Organism ID, Bacteria ESCHERICHIA COLI (A)  Final   Organism ID, Bacteria PROTEUS MIRABILIS (A)  Final      Susceptibility   Escherichia coli - MIC*    AMPICILLIN 4 SENSITIVE Sensitive     CEFAZOLIN <=4 SENSITIVE Sensitive     CEFTRIAXONE <=0.25 SENSITIVE Sensitive     CIPROFLOXACIN <=0.25 SENSITIVE Sensitive     GENTAMICIN <=1 SENSITIVE Sensitive     IMIPENEM <=0.25 SENSITIVE Sensitive     NITROFURANTOIN <=16 SENSITIVE Sensitive     TRIMETH/SULFA <=20 SENSITIVE Sensitive     AMPICILLIN/SULBACTAM <=2 SENSITIVE Sensitive     PIP/TAZO <=4 SENSITIVE Sensitive     * >=100,000 COLONIES/mL ESCHERICHIA COLI   Proteus mirabilis - MIC*    AMPICILLIN >=32 RESISTANT Resistant     CEFAZOLIN >=64 RESISTANT Resistant     CEFTRIAXONE <=0.25 SENSITIVE Sensitive     CIPROFLOXACIN <=0.25 SENSITIVE Sensitive     GENTAMICIN <=1 SENSITIVE Sensitive     IMIPENEM 2 SENSITIVE Sensitive     NITROFURANTOIN 128 RESISTANT Resistant     TRIMETH/SULFA <=20 SENSITIVE Sensitive     AMPICILLIN/SULBACTAM >=32 RESISTANT Resistant     PIP/TAZO <=4 SENSITIVE Sensitive     * >=100,000 COLONIES/mL PROTEUS MIRABILIS  Respiratory Panel by RT PCR (Flu A&B, Covid) - Nasopharyngeal Swab     Status: None   Collection Time: 10/26/2019  4:27 PM   Specimen: Nasopharyngeal Swab  Result Value Ref Range Status   SARS Coronavirus 2  by RT PCR NEGATIVE NEGATIVE Final    Comment: (NOTE) SARS-CoV-2 target nucleic acids are NOT DETECTED. The SARS-CoV-2 RNA is generally detectable in upper respiratoy specimens during  the acute phase of infection. The lowest concentration of SARS-CoV-2 viral copies this assay can detect is 131 copies/mL. A negative result does not preclude SARS-Cov-2 infection and should not be used as the sole basis for treatment or other patient management decisions. A negative result may occur with  improper specimen collection/handling, submission of specimen other than nasopharyngeal swab, presence of viral mutation(s) within the areas targeted by this assay, and inadequate number of viral copies (<131 copies/mL). A negative result must be combined with clinical observations, patient history, and epidemiological information. The expected result is Negative. Fact Sheet for Patients:  PinkCheek.be Fact Sheet for Healthcare Providers:  GravelBags.it This test is not yet ap proved or cleared by the [place] FDA and  has been authorized for detection and/or diagnosis of SARS-CoV-2 by FDA under an Emergency Use Authorization (EUA). This EUA will remain  in effect (meaning this test can be used) for the duration of the COVID-19 declaration under Section 564(b)(1) of the Act, 21 U.S.C. section 360bbb-3(b)(1), unless the authorization is terminated or revoked sooner.    Influenza A by PCR NEGATIVE NEGATIVE Final   Influenza B by PCR NEGATIVE NEGATIVE Final    Comment: (NOTE) The Xpert Xpress SARS-CoV-2/FLU/RSV assay is intended as an aid in  the diagnosis of influenza from Nasopharyngeal swab specimens and  should not be used as a sole basis for treatment. Nasal washings and  aspirates are unacceptable for Xpert Xpress SARS-CoV-2/FLU/RSV  testing. Fact Sheet for Patients: PinkCheek.be Fact Sheet for Healthcare  Providers: GravelBags.it This test is not yet approved or cleared by the [place] FDA and  has been authorized for detection and/or diagnosis of SARS-CoV-2 by  FDA under an Emergency Use Authorization (EUA). This EUA will remain  in effect (meaning this test can be used) for the duration of the  Covid-19 declaration under Section 564(b)(1) of the Act, 21  U.S.C. section 360bbb-3(b)(1), unless the authorization is  terminated or revoked. Performed at [facility], [place] [address] [place], [place] [address]   MRSA PCR Screening     Status: None   Collection Time: 11/07/19  4:32 AM   Specimen: Nasal Mucosa; Nasopharyngeal  Result Value Ref Range Status   MRSA by PCR NEGATIVE NEGATIVE Final    Comment:        The GeneXpert MRSA Assay (FDA approved for NASAL specimens only), is one component of a comprehensive MRSA colonization surveillance program. It is not intended to diagnose MRSA infection nor to guide or monitor treatment for MRSA infections. Performed at [facility], [place] [address] [place], [place] [address]       Radiology Studies: DG CHEST PORT 1 VIEW  Result Date: 11/08/2019 CLINICAL DATA:  Cough today. Recently missed hemodialysis. History of congestive heart failure and atrial fibrillation. EXAM: PORTABLE CHEST 1 VIEW COMPARISON:  Radiographs 10/15/2019 and 08/31/2019. CT 11/06/2018. FINDINGS: 1206 hours. Right IJ hemodialysis catheter tip is unchanged at the level of the superior cavoatrial junction. There is stable cardiomegaly and aortic atherosclerosis. Interval enlargement of a right pleural effusion, tracking into the fissures. Associated increased right basilar pulmonary opacity. Small left pleural effusion and left basilar pulmonary opacity are unchanged. No edema or pneumothorax. There are glenohumeral degenerative changes bilaterally with evidence of a chronic rotator cuff tear on the left. IMPRESSION: Interval  enlargement of right pleural effusion and right basilar pulmonary opacity. No other significant changes. Electronically Signed   By: Richardean Sale M.D.   On: 11/08/2019 12:22  DG Chest Port 1 View  Result Date: 10/30/2019 CLINICAL DATA:  Bruising to right shoulder. EXAM: PORTABLE CHEST 1 VIEW COMPARISON:  Chest x-ray dated August 31, 2019. FINDINGS: Unchanged tunneled right internal jugular dialysis catheter. Stable cardiomegaly. Improved bilateral pleural effusions, now small. Improved aeration at the right lung base. Persistent left basilar atelectasis. No pneumothorax. Healing left rib fractures. No acute osseous abnormality. IMPRESSION: Improved bilateral pleural effusions, now small. Electronically Signed   By: Titus Dubin M.D.   On: 11/05/2019 14:17      Scheduled Meds: . amiodarone  200 mg Oral Daily  . apixaban  2.5 mg Oral BID  . ARIPiprazole  10 mg Oral BID  . atorvastatin  20 mg Oral Daily  . brimonidine  1 drop Both Eyes BID  . Chlorhexidine Gluconate Cloth  6 each Topical Daily  . Chlorhexidine Gluconate Cloth  6 each Topical Q0600  . dorzolamide-timolol  1 drop Both Eyes BID  . feeding supplement (NEPRO CARB STEADY)  237 mL Oral BID BM  . feeding supplement (PRO-STAT SUGAR FREE 64)  30 mL Oral Daily  . mouth rinse  15 mL Mouth Rinse BID  . potassium chloride  20 mEq Oral BID  . sodium chloride flush  3 mL Intravenous Q12H  . valproic acid  250 mg Oral Q6H   Continuous Infusions: . sodium chloride       LOS: 1 day   Time spent: 35 minutes   Shelda Pal, DO [facility] 11/08/2019, 12:33 PM   Available via Epic secure chat 7am-7pm After these hours, please refer to coverage provider listed on [email or web address]

## 2019-11-08 NOTE — Progress Notes (Signed)
Renal Navigator received call from patient's boyfriend [name] asking if Navigator has received his message regarding wheelchair transportation. Renal Navigator confirmed and apologized for not getting back to him yet, but noted that patient does not seem to be doing well today and wanted to take a step back in making discharge plans at this time. Navigator asked Konrad Dolores how patient seems to be doing to him. He agreed that she has needed more O2 and that her BP was low. He thinks she just needs to have another HD treatment and she will get better. Navigator explained that the medical team would like for him and patient to meet with the Palliative Care team to discuss goals of care to ensure that everyone is on the same page with [facility] care as it appears she is declining further. He states he wants "aggressive everything." Navigator stated that we did not need to discuss wishes at this time, but that we do need to talk as a team. He stated understanding. He again began to talk about taking [place] home and getting her to dialysis, resuming the conversation about not knowing he needed to take her to a clinic, or where to take her when she left [facility]. Navigator stated that we did not need to discuss this right now as it appears she is not well enough to discharge straight home as OT has already recommended SNF again and patient was too weak to work with PT today ([name] had already stated this to Navigator and mentioned that patient's BP was too low to work with PT today). He said, " I thought [facility] a skilled facility and that's why she went there." Navigator confirmed that it is, but that she may need this type of rehab again. He said he would not be ok with that and will tell everyone that he will not agree for SNF again. Navigator asked that we take things one step at a time. Navigator agreed to meet with [name] tomorrow to follow up and see when Palliative Care would be able to speak with him.  Adult  Materials engineer wants to meet with team tomorrow and will await call from Renal Navigator regarding timing.   Alphonzo Cruise, [place] Renal Navigator [phone]

## 2019-11-08 NOTE — Progress Notes (Signed)
Renal Navigator had message from Jaclyn Day/patient's boyfriend, following up from our conversation yesterday, providing phone numbers for possible wheelchair transportation companies: His message states a company in [place], but he doesn't know the [place] x per [employer]. He states his sister is also trying to help him with finding some transportation options.  Alphonzo Cruise, [place] Renal Navigator [phone]

## 2019-11-08 NOTE — Evaluation (Signed)
Occupational Therapy Evaluation Patient Details Name: Jaclyn Day MRN: [id number] DOB: 01-09-1946 Today's Date: 11/08/2019    History of Present Illness 74 y.o. female with medical history significant of ESRD on HD; afib s/p DCCV 01/2019, on Coumadin; HTN; depression/anxiety; schizoaffective disorder; developmental disorder; and chronic diastolic CHF presenting with missed HD after [place] home from SNF. Recent admission 08/03/19-09/24/19 with cardiac arrest and brain infarcts where Pt DCed to SNF and had since d/c'd home   Clinical Impression   This 74 y/o female presents with the above. Pt with recent hospital admission, d/c to SNF and then discharged home. Pt reporting both use of RW and wheelchair after return home (?question accuracy of extent of RW use/mobility), reports boyfriend assisting intermittently with ADL. Session limited today as pt hypotensive with attempts at bed mobility. Pt eating breakfast in no apparent distress upon arrival to room, denies dizziness. Assisted with completion of meal/self-feeding and simple grooming ADL with setup assist. Attempted bed mobility with pt requiring mod-maxA for initial attempt to move towards EOB, with initial attempts pt reports feeling very sleepy and requesting return to supine. Once repositioned BP taken and 79/59, took again and BP remaining 70s/50s, RN made aware; all other vitals stable. Pt will benefit from continued acute OT services to progress mobility/ADL status once medically able, currently recommend SNF level therapies at time of discharge to further maximize her safety and independence with ADL/mobility prior to return home. Will follow.     Follow Up Recommendations  SNF;Supervision/Assistance - 24 hour    Equipment Recommendations  Other (comment);None recommended by OT(defer to next venue)           Precautions / Restrictions Precautions Precautions: Fall Precaution Comments: monitor BP, hypotension   Restrictions Weight Bearing Restrictions: No      Mobility Bed Mobility Overal bed mobility: Needs Assistance             General bed mobility comments: overall modA for repositioning and mobility attempts; attempted transition to sitting with pt able to initiated moving LEs towards EOB with assist provided for trunk elevation however during transition pt reports feeling very sleepy and reporting needing to return to supine, BP assessed in supine and 79/59 therefore further mobility attempts deferred, RN notified   Transfers                 General transfer comment: deferred given hypotension     Balance                                           ADL either performed or assessed with clinical judgement   ADL Overall ADL's : Needs assistance/impaired Eating/Feeding: Set up;Sitting Eating/Feeding Details (indicate cue type and reason): pt finishing breakfast during start of session Grooming: Wash/dry face;Wash/dry hands;Set up;Minimal assistance;Sitting;Bed level Grooming Details (indicate cue type and reason): some assist for thoroughness  Upper Body Bathing: Minimal assistance;Bed level;Sitting   Lower Body Bathing: Maximal assistance;Total assistance;Bed level   Upper Body Dressing : Minimal assistance;Moderate assistance;Bed level;Sitting   Lower Body Dressing: Total assistance;Bed level Lower Body Dressing Details (indicate cue type and reason): assist to don socks     Toileting- Clothing Manipulation and Hygiene: Total assistance;Bed level         General ADL Comments: limited session due to low BP (RN notified), pt completing breakfast during session, simple grooming ADL; overall presenting with weakness, decreased  activity tolerance and mobility status     Vision         Perception     Praxis      Pertinent Vitals/Pain Pain Assessment: No/denies pain     Hand Dominance Right   Extremity/Trunk Assessment Upper Extremity  Assessment Upper Extremity Assessment: Generalized weakness;RUE deficits/detail;LUE deficits/detail RUE Deficits / Details: limited shoulder ROM grossly to 100*, strength grossly 2/5 RUE Coordination: decreased gross motor LUE Deficits / Details: limited shoulder ROM grossly to 65*, strength grossly 2/5 LUE Coordination: decreased gross motor   Lower Extremity Assessment Lower Extremity Assessment: Defer to PT evaluation       Communication Communication Communication: No difficulties   Cognition Arousal/Alertness: Awake/alert;Lethargic(sleepy end of session) Behavior During Therapy: WFL for tasks assessed/performed Overall Cognitive Status: Impaired/Different from baseline Area of Impairment: Orientation;Attention;Memory                 Orientation Level: Disoriented to;Time Current Attention Level: Selective Memory: Decreased recall of precautions;Decreased short-term memory         General Comments: able to provide mostly accurate info related to PLOF and home setup (when comparing to recent admission info), some fluctuating responses requiring questioning cues to obtain accurate answer   General Comments  BP assessed in supine after bed mobility attempts and 79/59, re-taken and again remaining in the 70s/50s, RN made aware; all over vitals stable (SpO2 95%, HR 90s)     Exercises     Shoulder Instructions      Home Living Family/patient expects to be discharged to:: Private residence Living Arrangements: Spouse/significant other Available Help at Discharge: Family;Available 24 hours/day Type of Home: House Home Access: Ramped entrance     Home Layout: Two level;Able to live on main level with bedroom/bathroom     Bathroom Shower/Tub: Occupational psychologist: Standard     Home Equipment: Environmental consultant - 2 wheels;Cane - single point;Bedside commode;Shower seat;Wheelchair - manual          Prior Functioning/Environment Level of Independence:  Independent with assistive device(s);Needs assistance  Gait / Transfers Assistance Needed: per char pt w/c bound, pt initially stating she doesn't walk and then later reporting to therapist she walks in the home some with RW ADL's / Homemaking Assistance Needed: reports boyfriend assists with bathing ADL, has an aide 2-3x/wk    Comments: PLOF obtained via pt report and chart review        OT Problem List: Decreased strength;Decreased range of motion;Decreased activity tolerance;Impaired balance (sitting and/or standing);Decreased safety awareness;Decreased knowledge of use of DME or AE;Cardiopulmonary status limiting activity;Decreased knowledge of precautions      OT Treatment/Interventions: Self-care/ADL training;Therapeutic exercise;Energy conservation;DME and/or AE instruction;Therapeutic activities;Patient/family education;Balance training;Cognitive remediation/compensation    OT Goals(Current goals can be found in the care plan section) Acute Rehab OT Goals Patient Stated Goal: to rest OT Goal Formulation: With patient Time For Goal Achievement: 11/22/19 Potential to Achieve Goals: Good  OT Frequency: Min 2X/week   Barriers to D/C:            Co-evaluation              AM-PAC OT "6 Clicks" Daily Activity     Outcome Measure Help from another person eating meals?: A Little Help from another person taking care of personal grooming?: A Little Help from another person toileting, which includes using toliet, bedpan, or urinal?: Total Help from another person bathing (including washing, rinsing, drying)?: A Lot Help from another person to put on  and taking off regular upper body clothing?: A Lot Help from another person to put on and taking off regular lower body clothing?: Total 6 Click Score: 12   End of Session Equipment Utilized During Treatment: Oxygen Nurse Communication: Mobility status;Other (comment)(BP, hypotensive)  Activity Tolerance: Treatment limited  secondary to medical complications (Comment)(hypotension) Patient left: in bed;with call bell/phone within reach;with bed alarm set  OT Visit Diagnosis: Other abnormalities of gait and mobility (R26.89);Muscle weakness (generalized) (M62.81)                Time: [id number] OT Time Calculation (min): 28 min Charges:  OT General Charges $OT Visit: 1 Visit OT Evaluation $OT Eval Moderate Complexity: 1 Mod OT Treatments $Self Care/Home Management : 8-22 mins  Lou Cal, OT [facility] Pager [phone] Office [phone]  Raymondo Band 11/08/2019, 9:46 AM

## 2019-11-09 ENCOUNTER — Inpatient Hospital Stay (HOSPITAL_COMMUNITY): Payer: 59

## 2019-11-09 DIAGNOSIS — Z992 Dependence on renal dialysis: Secondary | ICD-10-CM

## 2019-11-09 DIAGNOSIS — R627 Adult failure to thrive: Secondary | ICD-10-CM

## 2019-11-09 DIAGNOSIS — N186 End stage renal disease: Secondary | ICD-10-CM

## 2019-11-09 DIAGNOSIS — Z515 Encounter for palliative care: Secondary | ICD-10-CM

## 2019-11-09 LAB — RENAL FUNCTION PANEL
Albumin: 2 g/dL — ABNORMAL LOW (ref 3.5–5.0)
Anion gap: 13 (ref 5–15)
BUN: 50 mg/dL — ABNORMAL HIGH (ref 8–23)
CO2: 23 mmol/L (ref 22–32)
Calcium: 8 mg/dL — ABNORMAL LOW (ref 8.9–10.3)
Chloride: 102 mmol/L (ref 98–111)
Creatinine, Ser: 4.97 mg/dL — ABNORMAL HIGH (ref 0.44–1.00)
GFR calc Af Amer: 9 mL/min — ABNORMAL LOW (ref >60)
GFR calc non Af Amer: 8 mL/min — ABNORMAL LOW (ref >60)
Glucose, Bld: 103 mg/dL — ABNORMAL HIGH (ref 70–99)
Phosphorus: 5.4 mg/dL — ABNORMAL HIGH (ref 2.5–4.6)
Potassium: 3.8 mmol/L (ref 3.5–5.1)
Sodium: 138 mmol/L (ref 135–145)

## 2019-11-09 LAB — CBC
HCT: 29.4 % — ABNORMAL LOW (ref 36.0–46.0)
Hemoglobin: 8.9 g/dL — ABNORMAL LOW (ref 12.0–15.0)
MCH: 29.7 pg (ref 26.0–34.0)
MCHC: 30.3 g/dL (ref 30.0–36.0)
MCV: 98 fL (ref 80.0–100.0)
Platelets: 117 10*3/uL — ABNORMAL LOW (ref 150–400)
RBC: 3 MIL/uL — ABNORMAL LOW (ref 3.87–5.11)
RDW: 18.1 % — ABNORMAL HIGH (ref 11.5–15.5)
WBC: 20.6 10*3/uL — ABNORMAL HIGH (ref 4.0–10.5)
nRBC: 0.1 % (ref 0.0–0.2)

## 2019-11-09 MED ORDER — HEPARIN SODIUM (PORCINE) 1000 UNIT/ML IJ SOLN
INTRAMUSCULAR | Status: AC
  Administered 2019-11-09: 2300 [IU] via INTRAVENOUS_CENTRAL
  Filled 2019-11-09: qty 3

## 2019-11-09 MED ORDER — SODIUM CHLORIDE 0.9 % IV BOLUS
250.0000 mL | Freq: Once | INTRAVENOUS | Status: AC
  Administered 2019-11-09: 250 mL via INTRAVENOUS

## 2019-11-09 MED ORDER — SCOPOLAMINE 1 MG/3DAYS TD PT72
1.0000 | MEDICATED_PATCH | TRANSDERMAL | Status: DC
  Administered 2019-11-09: 1.5 mg via TRANSDERMAL
  Filled 2019-11-09: qty 1

## 2019-11-09 MED ORDER — MIDODRINE HCL 5 MG PO TABS
10.0000 mg | ORAL_TABLET | Freq: Three times a day (TID) | ORAL | Status: DC
  Administered 2019-11-09 (×2): 10 mg via ORAL
  Filled 2019-11-09 (×2): qty 2

## 2019-11-09 MED ORDER — SODIUM CHLORIDE 0.9 % IV SOLN
500.0000 mg | INTRAVENOUS | Status: DC
  Administered 2019-11-09 – 2019-11-10 (×2): 500 mg via INTRAVENOUS
  Filled 2019-11-09 (×2): qty 500

## 2019-11-09 MED ORDER — HEPARIN SODIUM (PORCINE) 1000 UNIT/ML DIALYSIS: 2300.0000 [IU] | Freq: Once | INTRAMUSCULAR | Status: AC

## 2019-11-09 MED ORDER — HEPARIN SODIUM (PORCINE) 1000 UNIT/ML IJ SOLN
INTRAMUSCULAR | Status: AC
  Administered 2019-11-09: 3200 [IU]
  Filled 2019-11-09: qty 4

## 2019-11-09 NOTE — Progress Notes (Signed)
[facility] Progress Note  Subjective: seen in room, speech and coughing is better  Vitals:   11/09/19 0900 11/09/19 0941 11/09/19 1032 11/09/19 1117  BP: 110/72  (!) 79/52 (!) 90/54  Pulse: 94  78   Resp: 17  (!) 22 20  Temp: 98 F (36.7 C)  98.2 F (36.8 C)   TempSrc: Oral  Axillary   SpO2: 96% (!) 85% 96%   Weight:      Height:        Exam: Gen chron ill elderly female No jvd or bruits Chest clear bilat to bases RRR no MRG Abd soft ntnd no mass or ascites +bs MS no joint effusions or deformity Ext trace LE edema, RIJ TDC, +bruises R>L UE's Neuro is alert, Ox 3 , nf. gen weak    Home meds:  - amiodarone/ warfarin  - valproic acid/ aripiprazole 10 bid  - atorvastatin 20  - metoprolol 12.5 bid   - prn's/ vitamins/ supplements     [facility]pt HD[facility]   4h  400/800   74.5kg  2/2 bath  TDC  Hep 2300   venofer 50 qwk   mircera 100 ug q2, last 1/3   last HD 10/17/19    Assessment/ Plan: 1. ESRD - missed 2-3 wks HD w/ uremia here. Better w/ HD. Pt sig debilitated, palliative care consulted for [place].  Living situation is in question as well. Per SW/ APS. HD today.  2. HTN - bp's low here , ordered midodrine. Was on metop at home.  3. UTI - growing 2 organisms, started on IV Rocephin 4. Cough/ abnormal CXR- w/ bilat effusions, zithromax added to Rocephin for CAP coverage 5. Atrial fib - rate controlled on amio, holding BB 6. Recent MSSA bacteremia - treated, no sx's of recurrence 7. Cognitive impairment - seen by psychiatry, not competent to make her own decisions 8. Recent cardiac arrest/ CVA - improved, but deconditioned. PT/OT consulted per pmd 9. DNR       Kelly Splinter 11/09/2019, 12:50 PM  Inpatient medications: . amiodarone  200 mg Oral Daily  . apixaban  2.5 mg Oral BID  . ARIPiprazole  10 mg Oral BID  . atorvastatin  20 mg Oral Daily  . brimonidine  1 drop Both Eyes BID  . Chlorhexidine Gluconate Cloth  6 each Topical Daily   . Chlorhexidine Gluconate Cloth  6 each Topical Q0600  . Chlorhexidine Gluconate Cloth  6 each Topical Q0600  . dorzolamide-timolol  1 drop Both Eyes BID  . feeding supplement (NEPRO CARB STEADY)  237 mL Oral BID BM  . feeding supplement (PRO-STAT SUGAR FREE 64)  30 mL Oral Daily  . heparin      . mouth rinse  15 mL Mouth Rinse BID  . midodrine  10 mg Oral TID WC  . sodium chloride flush  3 mL Intravenous Q12H  . valproic acid  250 mg Oral Q6H   . azithromycin Stopped (11/09/19 1130)  . cefTRIAXone (ROCEPHIN)  IV 1 g (11/08/19 1816)   acetaminophen **OR** acetaminophen, calcium carbonate (dosed in mg elemental calcium), camphor-menthol **AND** hydrOXYzine, docusate sodium, feeding supplement (NEPRO CARB STEADY), guaiFENesin, ipratropium-albuterol, ondansetron **OR** ondansetron (ZOFRAN) IV, sorbitol, zolpidem

## 2019-11-09 NOTE — Consult Note (Signed)
Consultation Note Date: 11/09/2019   Patient Name: Jaclyn Day  DOB: Dec 06, 1945  MRN: [id number]  Age / Sex: 74 y.o., female  PCP: Cyndi Bender, PA-C Referring Physician: Shelda Pal,*  Reason for Consultation: Establishing goals of care and Psychosocial/spiritual support  HPI/Patient Profile: 74 y.o. female  admitted on 10/28/2019 with a past medical history of end-stage renal disease on dialysis, CHF, atrial fibrillation, hypertension, depression/anxiety, and schizoaffective disorder.  She had been admitted from 10/22-12/13 with cardiac arrest.  She had been physically deconditioned and found to have new CHF with reduced ejection fraction.  She was discharged from rehab/[facility] in early January and was discharged home to live with her significant other of 25 years.  She never was brought back to dialysis for  reasons that are now trying to be deciphered.  Seems that EMS was triggered because she was never brought to dialysis and when they went to her home, conditions were such that Adult Protective Services were involved.  She was admitted for treatment and stabilization.  She does have a sister Evie Lacks who lives in [place].   Currently patient does not have capacity to make her own medical decisions.  Family face treatment treatment option decisions, advanced directive and anticipatory care needs   Clinical Assessment and Goals of Care:   This NP Wadie Lessen reviewed medical records, received report from team, assessed the patient and then spoke  at the patient's bedside with sister/ Evie Lacks by phone and with HPOA Tommie Stutts   to discuss diagnosis, prognosis, GOC, EOL wishes disposition and options.  Concept of Palliative Care w discussed  I met with [name] Stutts at the bedside, patient is an HD.  Although he has detailed written notes regarding the patient's  care over the last many weeks and he does present documents supporting that he is healthcare power of attorney he is unable to verbalize why the patient never received her prescribed dialysis.  He verbalizes that he feels that the patient was discharged "with out any instructions" or " anybody to help him in the home".  Apparently according to him there was someone for physical therapy from advanced home care and both he and the patient's sister speak to an agency named [facility] providing care and a Mudlogger by the name of Bennie Dallas ?.   He verbalizes that he never received repeated calls from the outpatient dialysis center checking on the patient.  I worry that time Ruthe Mannan has very limited insight into the overall medical condition, viable treatment options and anticipatory care needs for [name]  In discussion with the patient's sister and Ruthe Mannan, she is requesting that all offered and available medical interventions be continued to prolong life.  The patient does have a documented DNR/DNI previously documented in her chart.  For now patient will be treated and stabilized as anticipatory care needs and safe placement be investigated and established.  She is high risk for decompensation  An APS meeting is set  for  Tuesday February 2nd at 1:00 pm.    Continued discussion regarding goals of care and treatment plan at that time     PMT will continue to support holistically.   HPOA/ Konrad Dolores Stutts-- presented documents reporting that he is the H POA.    SUMMARY OF RECOMMENDATIONS    Code Status/Advance Care Planning:  DNR    Palliative Prophylaxis:   Aspiration, Bowel Regimen, Delirium Protocol, Frequent Pain Assessment and Oral Care  Additional Recommendations (Limitations, Scope, Preferences):  Full Scope Treatment  Psycho-social/Spiritual:   Desire for further Chaplaincy support:yes  Additional Recommendations: Education on Hospice   Prognosis:   Unable to determine  Discharge Planning: To Be Determined      Primary Diagnoses: Present on Admission: . Hyperlipidemia . Hypertension . MSSA bacteremia . Persistent atrial fibrillation ([place]) . Schizoaffective disorder ([place]) . (Resolved) Abnormal thyroid function test . (Resolved) ESRD (end stage renal disease) ([place])   I have reviewed the medical record, interviewed the patient and family, and examined the patient. The following aspects are pertinent.  Past Medical History:  Diagnosis Date  . Anemia   . Chronic diastolic CHF (congestive heart failure) ([place])   . CKD (chronic kidney disease), stage IV ([place])   . Hypertension   . Persistent atrial fibrillation ([place])    a. dx 01/2019 s/p TEE DCCV.  Marland Kitchen Renal disorder   . Unilateral congenital absence of kidney    Social History   Socioeconomic History  . Marital status: Single    Spouse name: Not on file  . Number of children: Not on file  . Years of education: Not on file  . Highest education level: Not on file  Occupational History  . Not on file  Tobacco Use  . Smoking status: Never Smoker  . Smokeless tobacco: Never Used  Substance and Sexual Activity  . Alcohol use: Not Currently  . Drug use: Not Currently  . Sexual activity: Not on file  Other Topics Concern  . Not on file  Social History Narrative   Lives with her boyfriend in [place]. He is her POA.    Social Determinants of Health   Financial Resource Strain:   . Difficulty of Paying Living Expenses: Not on file  Food Insecurity:   . Worried About Charity fundraiser in the Last Year: Not on file  . Ran Out of Food in the Last Year: Not on file  Transportation Needs:   . Lack of Transportation (Medical): Not on file  . Lack of Transportation (Non-Medical): Not on file  Physical Activity:   . Days of Exercise per Week: Not on file  . Minutes of Exercise per Session: Not on file  Stress:   . Feeling of Stress : Not on file  Social  Connections:   . Frequency of Communication with Friends and Family: Not on file  . Frequency of Social Gatherings with Friends and Family: Not on file  . Attends Religious Services: Not on file  . Active Member of Clubs or Organizations: Not on file  . Attends Archivist Meetings: Not on file  . Marital Status: Not on file   Family History  Problem Relation Age of Onset  . Atrial fibrillation Father   . Atrial fibrillation Brother    Scheduled Meds: . amiodarone  200 mg Oral Daily  . apixaban  2.5 mg Oral BID  . ARIPiprazole  10 mg Oral BID  . atorvastatin  20 mg Oral Daily  . brimonidine  1 drop Both Eyes BID  . Chlorhexidine Gluconate Cloth  6 each Topical Daily  . Chlorhexidine Gluconate Cloth  6 each Topical Q0600  . Chlorhexidine Gluconate Cloth  6 each Topical Q0600  . dorzolamide-timolol  1 drop Both Eyes BID  . feeding supplement (NEPRO CARB STEADY)  237 mL Oral BID BM  . feeding supplement (PRO-STAT SUGAR FREE 64)  30 mL Oral Daily  . mouth rinse  15 mL Mouth Rinse BID  . sodium chloride flush  3 mL Intravenous Q12H  . valproic acid  250 mg Oral Q6H   Continuous Infusions: . azithromycin    . cefTRIAXone (ROCEPHIN)  IV 1 g (11/08/19 1816)   PRN Meds:.acetaminophen **OR** acetaminophen, calcium carbonate (dosed in mg elemental calcium), camphor-menthol **AND** hydrOXYzine, docusate sodium, feeding supplement (NEPRO CARB STEADY), guaiFENesin, ipratropium-albuterol, ondansetron **OR** ondansetron (ZOFRAN) IV, sorbitol, zolpidem Medications Prior to Admission:  Prior to Admission medications   Medication Sig Start Date End Date Taking? Authorizing Provider  acetaminophen (TYLENOL) 500 MG tablet Take 1,000 mg by mouth every 8 (eight) hours as needed for mild pain or headache.    Yes [provider]  Amino Acids-Protein Hydrolys (FEEDING SUPPLEMENT, PRO-STAT SUGAR FREE 64,) LIQD Take 30 mLs by mouth daily. For malnutrition 10/17/19  Yes Medina-Vargas, Monina  C, NP  amiodarone (PACERONE) 200 MG tablet Take 1 tablet (200 mg total) by mouth daily. 10/17/19 11/16/19 Yes Medina-Vargas, Monina C, NP  apixaban (ELIQUIS) 2.5 MG TABS tablet Take 2.5 mg by mouth 2 (two) times daily.   Yes [provider]  ARIPiprazole (ABILIFY) 10 MG tablet Take 1 tablet (10 mg total) by mouth 2 (two) times daily. 10/17/19  Yes Medina-Vargas, Monina C, NP  atorvastatin (LIPITOR) 20 MG tablet Take 1 tablet (20 mg total) by mouth daily. 10/17/19  Yes Medina-Vargas, Monina C, NP  brimonidine (ALPHAGAN) 0.15 % ophthalmic solution Place 1 drop into both eyes 2 (two) times daily. Patient taking differently: Place 1 drop into the left eye 2 (two) times daily.  10/17/19  Yes Medina-Vargas, Monina C, NP  dorzolamide-timolol (COSOPT) 22.3-6.8 MG/ML ophthalmic solution Place 1 drop into both eyes 2 (two) times daily. 10/17/19  Yes Medina-Vargas, Monina C, NP  ferrous sulfate 325 (65 FE) MG tablet Take 1 tablet (325 mg total) by mouth daily. 10/17/19 11/16/19 Yes Medina-Vargas, Monina C, NP  metoprolol tartrate (LOPRESSOR) 25 MG tablet Take 0.5 tablets (12.5 mg total) by mouth 2 (two) times daily. 10/17/19 11/16/19 Yes Medina-Vargas, Monina C, NP  valproic acid (DEPAKENE) 250 MG capsule Take 1 capsule (250 mg total) by mouth every 6 (six) hours. 10/17/19 11/16/19 Yes Medina-Vargas, Monina C, NP  warfarin (COUMADIN) 2 MG tablet Take 1 tablet (2 mg total) by mouth daily. Patient not taking: Reported on 11/04/2019 10/17/19   Medina-Vargas, Jaymes Graff C, NP   Allergies  Allergen Reactions  . Codeine Other (See Comments)    Reaction not recalled by family- was told to never take this   Review of Systems  Unable to perform ROS Constitutional: Negative for fever.    Physical Exam Constitutional:      General: She is awake.     Appearance: She is underweight.  Cardiovascular:     Rate and Rhythm: Normal rate.  Musculoskeletal:     Comments: Generalized weakness and muscle atrophy  Skin:    General: Skin  is warm and dry.  Neurological:     Mental Status: She is lethargic.     Vital Signs: BP Marland Kitchen)  79/52 (BP Location: Right Arm)   Pulse 78   Temp 98.2 F (36.8 C) (Axillary)   Resp (!) 22   Ht _0  (1.676 m)   Wt 83.4 kg   SpO2 96%   BMI 29.68 kg/m  Pain Scale: Faces   Pain Score: 0-No pain   SpO2: SpO2: 96 % O2 Device:SpO2: 96 % O2 Flow Rate: .O2 Flow Rate (L/min): 2.5 L/min  IO: Intake/output summary:   Intake/Output Summary (Last 24 hours) at 11/09/2019 1117 Last data filed at 11/09/2019 0800 Gross per 24 hour  Intake 1004.35 ml  Output 1 ml  Net 1003.35 ml    LBM: Last BM Date: 11/08/19 Baseline Weight: Weight: 88.9 kg Most recent weight: Weight: 83.4 kg     Palliative Assessment/Data:  30 % at best   Discussed with Terri Piedra Neysa Hotter navigator  Time In: 1000 Time Out: 1115 Time Total: 75 minutes Greater than 50%  of this time was spent counseling and coordinating care related to the above assessment and plan.  Signed by: Wadie Lessen, NP   Please contact Palliative Medicine Team phone at [phone] for questions and concerns.  For individual provider: See Shea Evans

## 2019-11-09 NOTE — Progress Notes (Signed)
Automatic BP 79/52. Manual BP 90/54. MD aware. NAD.   Venti Mask ordered. Sats 95% on 2.5L [place]. RT aware and didn't think pt needed venti mask. MD notified.

## 2019-11-09 NOTE — Plan of Care (Signed)
  Problem: Education: Goal: Knowledge of General Education information will improve Description: Including pain rating scale, medication(s)/side effects and non-pharmacologic comfort measures Outcome: Progressing   Problem: Skin Integrity: Goal: Risk for impaired skin integrity will decrease Outcome: Progressing   

## 2019-11-09 NOTE — Evaluation (Signed)
Physical Therapy Evaluation Patient Details Name: Jaclyn Day MRN: [id number] DOB: [birth year] Today's Date: 11/09/2019   History of Present Illness  74 y.o. female with medical history significant of ESRD on HD; afib s/p DCCV 01/2019, on Coumadin; HTN; depression/anxiety; schizoaffective disorder; developmental disorder; and chronic diastolic CHF presenting with missed HD after [place] home from SNF. Recent admission 08/03/19-09/24/19 with cardiac arrest and brain infarcts where Pt DCed to SNF and had since d/c'd home  Clinical Impression  Patient received in bed. Reports she is feeling okay, however requires encouragement to participate with therapy. Patient is very weak grossly. 2/5 strength in UE and LEs. O2 sats at rest on 2lpm was at 83% increased O2 to 2.5 and sats remained at 85%. MD, RN notified.  Large bruise noted over right arm/shoulder. Patient reports it is from medication. Denies fall.  Patient requires max assist at this time for bed mobility ( rolling and supine to sit.) She will continue to benefit from skilled PT while here to improve overall strength and functional independence.      Follow Up Recommendations SNF;Supervision/Assistance - 24 hour    Equipment Recommendations  None recommended by PT    Recommendations for Other Services       Precautions / Restrictions Precautions Precautions: Fall Precaution Comments: monitor BP, hypotension  Restrictions Weight Bearing Restrictions: No      Mobility  Bed Mobility Overal bed mobility: Needs Assistance Bed Mobility: Supine to Sit;Rolling Rolling: Max assist   Supine to sit: Max assist     General bed mobility comments: due to weakness patient requires max assist at this time to perform supine to sit and rolling.  Transfers                 General transfer comment: deferred  Ambulation/Gait             General Gait Details: unable  Stairs            Wheelchair Mobility    Modified  Rankin (Stroke Patients Only)       Balance Overall balance assessment: Needs assistance   Sitting balance-Leahy Scale: Poor                                       Pertinent Vitals/Pain Pain Assessment: No/denies pain    Home Living Family/patient expects to be discharged to:: Skilled nursing facility Living Arrangements: Spouse/significant other Available Help at Discharge: Available 24 hours/day;Friend(s) Type of Home: House Home Access: Ramped entrance     Home Layout: Two level;Able to live on main level with bedroom/bathroom Home Equipment: Gilford Rile - 2 wheels;Cane - single point;Bedside commode;Shower seat;Wheelchair - manual      Prior Function Level of Independence: Independent with assistive device(s);Needs assistance   Gait / Transfers Assistance Needed: per char pt w/c bound, pt initially stating she doesn't walk and then later reporting to therapist she walks in the home some with RW, tells me today that her boyfriend has to help her into wheelchair.  ADL's / Homemaking Assistance Needed: reports boyfriend assists with bathing ADL, has an aide 2-3x/wk  Comments: PLOF obtained via pt report and chart review     Hand Dominance   Dominant Hand: Right    Extremity/Trunk Assessment   Upper Extremity Assessment Upper Extremity Assessment: Generalized weakness RUE Deficits / Details: limited shoulder ROM grossly to 100*, strength grossly 2/5 RUE Coordination: decreased gross  motor LUE Deficits / Details: limited shoulder ROM grossly to 65*, strength grossly 2/5 LUE Coordination: decreased gross motor    Lower Extremity Assessment Lower Extremity Assessment: Generalized weakness(unable to raise LEs off bed. strength grossly 2/5 bilaterally)       Communication   Communication: Expressive difficulties  Cognition Arousal/Alertness: Awake/alert   Overall Cognitive Status: No family/caregiver present to determine baseline cognitive  functioning Area of Impairment: Orientation;Attention;Memory                 Orientation Level: Disoriented to;Time Current Attention Level: Selective Memory: Decreased recall of precautions;Decreased short-term memory                General Comments      Exercises Other Exercises Other Exercises: ap, heel slides x 5 reps each   Assessment/Plan    PT Assessment Patient needs continued PT services  PT Problem List Decreased strength;Decreased activity tolerance;Decreased balance;Decreased mobility;Cardiopulmonary status limiting activity;Decreased safety awareness       PT Treatment Interventions Therapeutic exercise;Functional mobility training;Therapeutic activities;Patient/family education;Balance training;Gait training    PT Goals (Current goals can be found in the Care Plan section)  Acute Rehab PT Goals Patient Stated Goal: wants her boyfriend to come PT Goal Formulation: With patient Time For Goal Achievement: 11/23/19 Potential to Achieve Goals: Fair    Frequency Min 2X/week   Barriers to discharge Decreased caregiver support      Co-evaluation               AM-PAC PT "6 Clicks" Mobility  Outcome Measure Help needed turning from your back to your side while in a flat bed without using bedrails?: A Lot Help needed moving from lying on your back to sitting on the side of a flat bed without using bedrails?: A Lot Help needed moving to and from a bed to a chair (including a wheelchair)?: Total Help needed standing up from a chair using your arms (e.g., wheelchair or bedside chair)?: Total Help needed to walk in hospital room?: Total Help needed climbing 3-5 steps with a railing? : Total 6 Click Score: 8    End of Session Equipment Utilized During Treatment: Oxygen Activity Tolerance: Patient limited by lethargy Patient left: in bed;with call bell/phone within reach Nurse Communication: Mobility status;Other (comment)(O2 sats 85% on 2.5 lpm at  rest) PT Visit Diagnosis: Other abnormalities of gait and mobility (R26.89);Muscle weakness (generalized) (M62.81)    Time: [id number] PT Time Calculation (min) (ACUTE ONLY): 21 min   Charges:   PT Evaluation $PT Eval Moderate Complexity: 1 Mod PT Treatments $Therapeutic Activity: 8-22 mins        Jaclyn Day, PT, GCS 11/09/19,9:53 AM

## 2019-11-09 NOTE — Plan of Care (Signed)
  Problem: Coping: Goal: Level of anxiety will decrease Outcome: Progressing   Problem: Skin Integrity: Goal: Risk for impaired skin integrity will decrease Outcome: Progressing   

## 2019-11-09 NOTE — Progress Notes (Signed)
Jaclyn Ravens, MD notified of increased cough, tachypnea and pale skin. Scopalamine patch ordered. Placed on Venti mask    11/09/19 1900  Vitals  BP (!) 111/53  Pulse Rate 78

## 2019-11-09 NOTE — Progress Notes (Addendum)
PROGRESS NOTE    Jaclyn Day  [id number] DOB: 04-Oct-1946 DOA: 11/12/2019 PCP: Cyndi Bender, PA-C     Brief Narrative:  Patient is a 74 year old female with a past medical history of end-stage renal disease on dialysis, CHF, atrial fibrillation, hypertension, depression/anxiety, and schizoaffective disorder.  She left rehab in early January.  She is never brought back for dialysis as she did not think she would need it forever.  She had a cough upon admission.  She had been admitted from 10/22-12/13 with cardiac arrest.  She had been physically deconditioned and found to have new CHF with reduced ejection fraction.  Patient had dialysis on 1/26.  She is noted to be a poor candidate for the nephrology team.   New events last 24 hours / Subjective: Palliative care consulted.  UTI noted on culture, Rocephin started.  She received a total of 1 L normal saline for bolus due to low blood pressures yesterday.  Overnight the seem to decent.  She reports she is tired and weak today.  She is working physical therapy.  She has nasal cannula supplemental oxygen currently, she is breathing through her mouth well.  Her oxygen is in the mid 80s after working with physical therapy.  She is still having a cough.  No fevers.  Assessment & Plan:   Principal Problem:   Non-compliance with renal dialysis ([place])  Appreciate social work             APS involved             HD 1/26, appreciate nephrology             Psych determined she lacks capacity to make her own decisions             Appreciate palliative care  Active Problems:   Hypotension  Currently stable    Acute respiratory failure  Repeat chest x-ray; no change, will cover CAP by adding Azithromycin  DuoNebs  Changed to Ventimask, keep oxygen saturations 88% or higher    Acute cystitis             Rocephin 1 g/d; day 2    CHF (congestive heart failure) [facility])             Cardiology has signed off    Persistent atrial  fibrillation (HCC)             Continue amiodarone 200 mg daily             Eliquis 2.5 mg daily    Hyperlipidemia             Lipitor 20 mg daily                Schizoaffective disorder (HCC)             Continue Abilify 10 mg twice daily    ESRD (end stage renal disease) on dialysis ([place])             As above    Stroke associated seizure prophylaxis             Continue valproic acid 250 mg every 6 hours    Physical deconditioning             PT/OT   In agreement with assessment of the pressure ulcer as below:  Pressure Injury 10/23/2019 Pretibial Right;Lateral Unstageable - Full thickness tissue loss in which the base of the injury is covered by slough (yellow, tan, gray, green or  brown) and/or eschar (tan, brown or black) in the wound bed. (Active)  11/03/2019 2100  Location: Pretibial  Location Orientation: Right;Lateral  Staging: Unstageable - Full thickness tissue loss in which the base of the injury is covered by slough (yellow, tan, gray, green or brown) and/or eschar (tan, brown or black) in the wound bed.  Wound Description (Comments):   Present on Admission: Yes     Pressure Injury 10/21/2019 Pretibial Right;Lateral Unstageable - Full thickness tissue loss in which the base of the injury is covered by slough (yellow, tan, gray, green or brown) and/or eschar (tan, brown or black) in the wound bed. (Active)  11/07/2019 2100  Location: Pretibial  Location Orientation: Right;Lateral  Staging: Unstageable - Full thickness tissue loss in which the base of the injury is covered by slough (yellow, tan, gray, green or brown) and/or eschar (tan, brown or black) in the wound bed.  Wound Description (Comments):   Present on Admission:      Pressure Injury 11/05/2019 Heel Left;Lateral Stage 2 -  Partial thickness loss of dermis presenting as a shallow open injury with a red, pink wound bed without slough. (Active)  10/28/2019 2100  Location: Heel  Location Orientation: Left;Lateral   Staging: Stage 2 -  Partial thickness loss of dermis presenting as a shallow open injury with a red, pink wound bed without slough.  Wound Description (Comments):   Present on Admission: Yes     Pressure Injury 11/04/2019 Buttocks Right;Lower Stage 2 -  Partial thickness loss of dermis presenting as a shallow open injury with a red, pink wound bed without slough. (Active)  10/30/2019 2100  Location: Buttocks  Location Orientation: Right;Lower  Staging: Stage 2 -  Partial thickness loss of dermis presenting as a shallow open injury with a red, pink wound bed without slough.  Wound Description (Comments):   Present on Admission: Yes         DVT prophylaxis: Eliquis Code Status: DNR Family Communication: Boyfriend [name] Stutts Disposition Plan: SNF; SNF; barriers to d/c include poor family support, hypotension, and now hypoxemia; She is coming from home after leaving SNF    Consultants:   Nephrology  Psychiatry  Palliative Care  Procedures:   HD 1/26  Antimicrobials:  Anti-infectives (From admission, onward)   Start     Dose/Rate Route Frequency Ordered Stop   11/08/19 1700  cefTRIAXone (ROCEPHIN) 1 g in sodium chloride 0.9 % 100 mL IVPB     1 g 200 mL/hr over 30 Minutes Intravenous Every 24 hours 11/08/19 1502          Objective: Vitals:   11/09/19 0515 11/09/19 0800 11/09/19 0900 11/09/19 0941  BP: 95/74  110/72   Pulse: 91  94   Resp: 16  17   Temp: 97.7 F (36.5 C)  98 F (36.7 C)   TempSrc:   Oral   SpO2: 95%  96% (!) 85%  Weight:  83.4 kg    Height:  5\' 6"  (1.676 m)      Intake/Output Summary (Last 24 hours) at 11/09/2019 0942 Last data filed at 11/09/2019 0800 Gross per 24 hour  Intake 1004.35 ml  Output 1 ml  Net 1003.35 ml   Filed Weights   11/07/19 0841 11/08/19 2300 11/09/19 0800  Weight: 88.9 kg 83.4 kg 83.4 kg    Examination:  General exam: Appears calm and comfortable  Respiratory system: Decreased RLL sounds; otherwise clear to  auscultation. Respiratory effort normal. No respiratory distress. No conversational dyspnea.  Cardiovascular system: S1 & S2 heard, Irreg irreg rhythm. No pedal edema. Gastrointestinal system: Abdomen is nondistended, soft and nontender. Normal bowel sounds heard. Extremities: Symmetric in appearance  Skin: No rashes, lesions or ulcers on exposed skin  Psychiatry: Judgement and insight appear limited. Mood & affect appropriate.   Data Reviewed: I have personally reviewed following labs and imaging studies  CBC: Recent Labs  Lab 10/30/2019 1345 11/05/2019 1428 11/07/19 0553 11/07/19 1752 11/08/19 0705  WBC 15.0*  --  14.5* 16.0* 18.4*  NEUTROABS 11.1*  --   --   --   --   HGB 7.6* 8.5* 6.8* 8.7* 9.5*  HCT 26.0* 25.0* 21.4* 27.3* 29.9*  MCV 99.2  --  93.0 93.2 94.9  PLT 218  --  167 145* [id number]*   Basic Metabolic Panel: Recent Labs  Lab 10/31/2019 1345 11/01/2019 1428 11/07/19 0553 11/08/19 0705  NA 142 138 138 135  K 4.5 4.4 5.4* 3.2*  CL 108 108 106 99  CO2 16*  --  13* 21*  GLUCOSE 126* 118* 81 71  BUN 109* 115* 114* 34*  CREATININE 8.01* 8.30* 8.14* 3.62*  CALCIUM 8.9  --  8.2* 8.2*   GFR: Estimated Creatinine Clearance: 15.3 mL/min (A) (by C-G formula based on SCr of 3.62 mg/dL (H)). Liver Function Tests: Recent Labs  Lab 10/22/2019 1345  AST 11*  ALT 11  ALKPHOS 75  BILITOT 0.3  PROT 6.5  ALBUMIN 2.5*   Coagulation Profile: Recent Labs  Lab 11/05/2019 1836  INR 2.3*   Thyroid Function Tests: Recent Labs    11/10/2019 1836  TSH 8.797*  FREET4 0.86   Recent Results (from the past 240 hour(s))  Urine culture     Status: Abnormal   Collection Time: 10/29/2019  4:07 PM   Specimen: Urine, Catheterized  Result Value Ref Range Status   Specimen Description URINE, CATHETERIZED  Final   Special Requests   Final    NONE Performed at [facility], [place] [address] [place], [place] [address]    Culture (A)  Final    >=100,000 COLONIES/mL ESCHERICHIA COLI  >=100,000 COLONIES/mL PROTEUS MIRABILIS    Report Status 11/08/2019 FINAL  Final   Organism ID, Bacteria ESCHERICHIA COLI (A)  Final   Organism ID, Bacteria PROTEUS MIRABILIS (A)  Final      Susceptibility   Escherichia coli - MIC*    AMPICILLIN 4 SENSITIVE Sensitive     CEFAZOLIN <=4 SENSITIVE Sensitive     CEFTRIAXONE <=0.25 SENSITIVE Sensitive     CIPROFLOXACIN <=0.25 SENSITIVE Sensitive     GENTAMICIN <=1 SENSITIVE Sensitive     IMIPENEM <=0.25 SENSITIVE Sensitive     NITROFURANTOIN <=16 SENSITIVE Sensitive     TRIMETH/SULFA <=20 SENSITIVE Sensitive     AMPICILLIN/SULBACTAM <=2 SENSITIVE Sensitive     PIP/TAZO <=4 SENSITIVE Sensitive     * >=100,000 COLONIES/mL ESCHERICHIA COLI   Proteus mirabilis - MIC*    AMPICILLIN >=32 RESISTANT Resistant     CEFAZOLIN >=64 RESISTANT Resistant     CEFTRIAXONE <=0.25 SENSITIVE Sensitive     CIPROFLOXACIN <=0.25 SENSITIVE Sensitive     GENTAMICIN <=1 SENSITIVE Sensitive     IMIPENEM 2 SENSITIVE Sensitive     NITROFURANTOIN 128 RESISTANT Resistant     TRIMETH/SULFA <=20 SENSITIVE Sensitive     AMPICILLIN/SULBACTAM >=32 RESISTANT Resistant     PIP/TAZO <=4 SENSITIVE Sensitive     * >=100,000 COLONIES/mL PROTEUS MIRABILIS  Respiratory Panel by RT PCR (Flu A&B, Covid) -  Nasopharyngeal Swab     Status: None   Collection Time: 10/24/2019  4:27 PM   Specimen: Nasopharyngeal Swab  Result Value Ref Range Status   SARS Coronavirus 2 by RT PCR NEGATIVE NEGATIVE Final    Comment: (NOTE) SARS-CoV-2 target nucleic acids are NOT DETECTED. The SARS-CoV-2 RNA is generally detectable in upper respiratoy specimens during the acute phase of infection. The lowest concentration of SARS-CoV-2 viral copies this assay can detect is 131 copies/mL. A negative result does not preclude SARS-Cov-2 infection and should not be used as the sole basis for treatment or other patient management decisions. A negative result may occur with  improper specimen  collection/handling, submission of specimen other than nasopharyngeal swab, presence of viral mutation(s) within the areas targeted by this assay, and inadequate number of viral copies (<131 copies/mL). A negative result must be combined with clinical observations, patient history, and epidemiological information. The expected result is Negative. Fact Sheet for Patients:  PinkCheek.be Fact Sheet for Healthcare Providers:  GravelBags.it This test is not yet ap proved or cleared by the [place] FDA and  has been authorized for detection and/or diagnosis of SARS-CoV-2 by FDA under an Emergency Use Authorization (EUA). This EUA will remain  in effect (meaning this test can be used) for the duration of the COVID-19 declaration under Section 564(b)(1) of the Act, 21 U.S.C. section 360bbb-3(b)(1), unless the authorization is terminated or revoked sooner.    Influenza A by PCR NEGATIVE NEGATIVE Final   Influenza B by PCR NEGATIVE NEGATIVE Final    Comment: (NOTE) The Xpert Xpress SARS-CoV-2/FLU/RSV assay is intended as an aid in  the diagnosis of influenza from Nasopharyngeal swab specimens and  should not be used as a sole basis for treatment. Nasal washings and  aspirates are unacceptable for Xpert Xpress SARS-CoV-2/FLU/RSV  testing. Fact Sheet for Patients: PinkCheek.be Fact Sheet for Healthcare Providers: GravelBags.it This test is not yet approved or cleared by the [place] FDA and  has been authorized for detection and/or diagnosis of SARS-CoV-2 by  FDA under an Emergency Use Authorization (EUA). This EUA will remain  in effect (meaning this test can be used) for the duration of the  Covid-19 declaration under Section 564(b)(1) of the Act, 21  U.S.C. section 360bbb-3(b)(1), unless the authorization is  terminated or revoked. Performed at [facility], [place] [address], [place], [place] [address]   MRSA PCR Screening     Status: None   Collection Time: 11/07/19  4:32 AM   Specimen: Nasal Mucosa; Nasopharyngeal  Result Value Ref Range Status   MRSA by PCR NEGATIVE NEGATIVE Final    Comment:        The GeneXpert MRSA Assay (FDA approved for NASAL specimens only), is one component of a comprehensive MRSA colonization surveillance program. It is not intended to diagnose MRSA infection nor to guide or monitor treatment for MRSA infections. Performed at [facility], [place] [address] [place], [place] [address]       Radiology Studies: DG CHEST PORT 1 VIEW  Result Date: 11/08/2019 CLINICAL DATA:  Cough today. Recently missed hemodialysis. History of congestive heart failure and atrial fibrillation. EXAM: PORTABLE CHEST 1 VIEW COMPARISON:  Radiographs 10/17/2019 and 08/31/2019. CT 11/06/2018. FINDINGS: 1206 hours. Right IJ hemodialysis catheter tip is unchanged at the level of the superior cavoatrial junction. There is stable cardiomegaly and aortic atherosclerosis. Interval enlargement of a right pleural effusion, tracking into the fissures. Associated increased right basilar pulmonary opacity. Small  left pleural effusion and left basilar pulmonary opacity are unchanged. No edema or pneumothorax. There are glenohumeral degenerative changes bilaterally with evidence of a chronic rotator cuff tear on the left. IMPRESSION: Interval enlargement of right pleural effusion and right basilar pulmonary opacity. No other significant changes. Electronically Signed   By: Richardean Sale M.D.   On: 11/08/2019 12:22      Scheduled Meds: . amiodarone  200 mg Oral Daily  . apixaban  2.5 mg Oral BID  . ARIPiprazole  10 mg Oral BID  . atorvastatin  20 mg Oral Daily  . brimonidine  1 drop Both Eyes BID  . Chlorhexidine Gluconate Cloth  6 each Topical Daily  . Chlorhexidine Gluconate Cloth  6 each Topical Q0600  . Chlorhexidine Gluconate Cloth  6  each Topical Q0600  . dorzolamide-timolol  1 drop Both Eyes BID  . feeding supplement (NEPRO CARB STEADY)  237 mL Oral BID BM  . feeding supplement (PRO-STAT SUGAR FREE 64)  30 mL Oral Daily  . mouth rinse  15 mL Mouth Rinse BID  . sodium chloride flush  3 mL Intravenous Q12H  . valproic acid  250 mg Oral Q6H   Continuous Infusions: . cefTRIAXone (ROCEPHIN)  IV 1 g (11/08/19 1816)     LOS: 2 days    Time spent: 30 minutes   Shelda Pal, DO [facility] 11/09/2019, 9:42 AM   Available via Epic secure chat 7am-7pm After these hours, please refer to coverage provider listed on [email or web address]

## 2019-11-09 NOTE — Progress Notes (Signed)
Renal Navigator clarified with OP HD clinic/[facility] staff regarding communication between staff and significant other regarding patient's missed HD. [place] staff states patient's significant other was called "12 times" to ask him to bring patient to HD or to the hospital. Staff reports that patient's significant other stated that patient did not need HD. Renal Navigator met with PMT/M. Larach and [name]/patient's significant other in patient's room while patient was in HD. [name] had paperwork documenting him as HCPOA. PMT copied this for patient's chart.  [name] continued to talk about the lack of instruction from [facility] when patient discharged and "no help" for him with patient. Renal Navigator asked him about the multiple calls from the outpatient dialysis clinic instructing him to bring patient for dialysis, and he states he does not know what to say about this. He then asked if Navigator was talking about a "diagnostic center." Renal Navigator clarified that he had received calls from [facility] and he replied, "she was on 6E." Renal Navigator again explained that [facility] is where patient is set up for OP HD treatment and is the clinic that had been calling him. He states he doesn't know what to say.  [name] gave Renal Navigator a notebook filled with handwritten notes he had documented of things that have happened over the last few months. He told Renal Navigator to keep it and show it to anyone who needed to see it. He is adamant that patient will return home with him and will not return to a SNF. He states he has more support at home now, and that he will hire a housekeeper. Renal Navigator informed him that per Case Management note, [facility] is unable to provide services in the home because Makenley's care is too great for their agency. He kept saying that, "if they didn't like my dogs, why didn't they ask me to put them up like the physical therapist did?" Renal  Navigator asked [name] what specific supports he has put in place in the last few days to provide the care patient needs at home and he said he plans to do home dialysis for patient. Renal Navigator told Konrad Dolores, "Shavonda is not a candidate for home dialysis at this time. She is too frail. Home dialysis is not in the discussion right now." He told Navigator that this was not fair to say. Renal Navigator offered to have a Nephrologist come talk to him about this also, explaining that given the fact that Falesha may not be able to tolerate HD in the hospital makes her not safe to discuss home dialysis. He agreed that maybe she isn't now, but maybe she will be in the future. We left the conversation at that. [name] reports that patient has a CAP worker 6 hours per day, set up by [employer]. This has been reinstated since her return from SNF, per [facility].  Konrad Dolores also handed Renal Navigator paperwork from [place] that was sent home with patient at discharge. The only thing about HD in the discharge summary said "Tues, Thur, Sat" and that it was first ordered on 09/26/19 and last had on 10/17/19. It did not have her next appointment or the location, phone number or time. Renal Navigator agrees that this is not much information from the SNF regarding dialysis. Renal Navigator told Konrad Dolores that Destenie requires total care, and can imagine that it was very overwhelming when she finally returned home after hospitalization and rehab. Renal Navigator informed Konrad Dolores that the recommendation from  PT/OT is SNF again. He states "we already tried that. She still can't walk. We're not doing that again." He asked Navigator why patient can't walk, with almost an aggressive tone at this point. Renal Navigator suggests he discuss this with a doctor or therapist. He added that he doesn't know what the physical therapists are getting paid for if Karaline still can't walk after all this therapy. Renal Navigator redirected the conversation to  Barbaraann's current state and fact that right now we are seeing how she tolerates dialysis in the hospital. Konrad Dolores states that a doctor called him yesterday and said that she might not be able to tolerate dialysis and that this made him angry. Renal Navigator validated the difficulty in considering stopping dialysis, but that we need to speak openly about what Shanitra's body can tolerate at this point.  If she can't tolerate inpatient HD, she will not be able to go back to an outpatient HD center and we will have to discuss comfort care as a team, with him and Raine included. Renal Navigator confirmed that currently, we are treating Orvetta and hopeful that she will improve, but that we are able to make her comfortable in passing if it is decided that aggressive treatment can no longer be offered.  [name] asked if Navigator has followed up with wheelchair transportation companies. Navigator informed Konrad Dolores that the company in [place] will not be suitable because they do not transport on Saturdays, which will not work with patient's current schedule. He is to follow up with the person he knows in [place]. Renal Navigator later received call from APS Emilia Beck C who states she will need to meet with the medical team and with patient's significant other, now that it has been determined that he has documented HCPOA. A meeting has been scheduled for Tuesday, 11/14/19. Renal Navigator notified CM/W. Estelle Grumbles, Nephrologist (and will notify Nephrologist next week) and will request that Attending and a unit RN attend if possible. Per APS worker, patient's significant other is aware of the meeting and plans to attend.   Renal Navigator will follow closely.   Alphonzo Cruise, [place] Renal Navigator [phone]

## 2019-11-10 DIAGNOSIS — N179 Acute kidney failure, unspecified: Principal | ICD-10-CM

## 2019-11-10 DIAGNOSIS — Z7189 Other specified counseling: Secondary | ICD-10-CM

## 2019-11-10 LAB — RENAL FUNCTION PANEL
Albumin: 1.9 g/dL — ABNORMAL LOW (ref 3.5–5.0)
Anion gap: 6 (ref 5–15)
BUN: 25 mg/dL — ABNORMAL HIGH (ref 8–23)
CO2: 27 mmol/L (ref 22–32)
Calcium: 8.1 mg/dL — ABNORMAL LOW (ref 8.9–10.3)
Chloride: 105 mmol/L (ref 98–111)
Creatinine, Ser: 3.18 mg/dL — ABNORMAL HIGH (ref 0.44–1.00)
GFR calc Af Amer: 16 mL/min — ABNORMAL LOW (ref >60)
GFR calc non Af Amer: 14 mL/min — ABNORMAL LOW (ref >60)
Glucose, Bld: 90 mg/dL (ref 70–99)
Phosphorus: 4.6 mg/dL (ref 2.5–4.6)
Potassium: 4 mmol/L (ref 3.5–5.1)
Sodium: 138 mmol/L (ref 135–145)

## 2019-11-10 LAB — LACTATE DEHYDROGENASE: LDH: 137 U/L (ref 98–192)

## 2019-11-10 LAB — PROTIME-INR
INR: 1.6 — ABNORMAL HIGH (ref 0.8–1.2)
Prothrombin Time: 18.6 seconds — ABNORMAL HIGH (ref 11.4–15.2)

## 2019-11-10 LAB — CBC
HCT: 27.9 % — ABNORMAL LOW (ref 36.0–46.0)
Hemoglobin: 8.6 g/dL — ABNORMAL LOW (ref 12.0–15.0)
MCH: 30.5 pg (ref 26.0–34.0)
MCHC: 30.8 g/dL (ref 30.0–36.0)
MCV: 98.9 fL (ref 80.0–100.0)
Platelets: 105 10*3/uL — ABNORMAL LOW (ref 150–400)
RBC: 2.82 MIL/uL — ABNORMAL LOW (ref 3.87–5.11)
RDW: 18.5 % — ABNORMAL HIGH (ref 11.5–15.5)
WBC: 18.4 10*3/uL — ABNORMAL HIGH (ref 4.0–10.5)
nRBC: 0 % (ref 0.0–0.2)

## 2019-11-10 LAB — PROTEIN, TOTAL: Total Protein: 5.1 g/dL — ABNORMAL LOW (ref 6.5–8.1)

## 2019-11-10 LAB — GLUCOSE, CAPILLARY: Glucose-Capillary: 72 mg/dL (ref 70–99)

## 2019-11-10 MED ORDER — LORAZEPAM 1 MG PO TABS: 1.0000 mg | ORAL_TABLET | ORAL | Status: DC | PRN

## 2019-11-10 MED ORDER — GLYCOPYRROLATE 0.2 MG/ML IJ SOLN: 0.2000 mg | INTRAMUSCULAR | Status: DC | PRN

## 2019-11-10 MED ORDER — HALOPERIDOL LACTATE 2 MG/ML PO CONC
0.5000 mg | ORAL | Status: DC | PRN
  Filled 2019-11-10: qty 0.3

## 2019-11-10 MED ORDER — HALOPERIDOL 0.5 MG PO TABS
0.5000 mg | ORAL_TABLET | ORAL | Status: DC | PRN
  Filled 2019-11-10: qty 1

## 2019-11-10 MED ORDER — ACETAMINOPHEN 650 MG RE SUPP: 650.0000 mg | Freq: Four times a day (QID) | RECTAL | Status: DC | PRN

## 2019-11-10 MED ORDER — LORAZEPAM 2 MG/ML PO CONC: 1.0000 mg | ORAL | Status: DC | PRN

## 2019-11-10 MED ORDER — ONDANSETRON HCL 4 MG/2ML IJ SOLN: 4.0000 mg | Freq: Four times a day (QID) | INTRAMUSCULAR | Status: DC | PRN

## 2019-11-10 MED ORDER — ACETAMINOPHEN 325 MG PO TABS: 650.0000 mg | ORAL_TABLET | Freq: Four times a day (QID) | ORAL | Status: DC | PRN

## 2019-11-10 MED ORDER — ONDANSETRON 4 MG PO TBDP: 4.0000 mg | ORAL_TABLET | Freq: Four times a day (QID) | ORAL | Status: DC | PRN

## 2019-11-10 MED ORDER — CHLORHEXIDINE GLUCONATE CLOTH 2 % EX PADS
6.0000 | MEDICATED_PAD | Freq: Every day | CUTANEOUS | Status: DC
  Administered 2019-11-11: 6 via TOPICAL

## 2019-11-10 MED ORDER — POLYVINYL ALCOHOL 1.4 % OP SOLN
1.0000 [drp] | Freq: Four times a day (QID) | OPHTHALMIC | Status: DC | PRN
  Filled 2019-11-10: qty 15

## 2019-11-10 MED ORDER — BIOTENE DRY MOUTH MT LIQD: 15.0000 mL | OROMUCOSAL | Status: DC | PRN

## 2019-11-10 MED ORDER — HYDROMORPHONE HCL 1 MG/ML IJ SOLN
1.0000 mg | INTRAMUSCULAR | Status: DC | PRN
  Administered 2019-11-10: 1 mg via INTRAVENOUS
  Filled 2019-11-10: qty 1

## 2019-11-10 MED ORDER — HALOPERIDOL LACTATE 5 MG/ML IJ SOLN: 0.5000 mg | INTRAMUSCULAR | Status: DC | PRN

## 2019-11-10 MED ORDER — SODIUM CHLORIDE 0.9 % IV BOLUS
500.0000 mL | Freq: Once | INTRAVENOUS | Status: AC
  Administered 2019-11-10: 500 mL via INTRAVENOUS

## 2019-11-10 MED ORDER — LORAZEPAM 2 MG/ML IJ SOLN: 1.0000 mg | INTRAMUSCULAR | Status: DC | PRN

## 2019-11-10 MED ORDER — GLYCOPYRROLATE 1 MG PO TABS: 1.0000 mg | ORAL_TABLET | ORAL | Status: DC | PRN

## 2019-11-10 MED ORDER — CHLORHEXIDINE GLUCONATE CLOTH 2 % EX PADS: 6.0000 | MEDICATED_PAD | Freq: Every day | CUTANEOUS | Status: DC

## 2019-11-10 NOTE — Progress Notes (Signed)
See Rapid Response nurse note. Dr Nani Ravens was in to see pt. Pt's significant other had left hospital right before 3pm according RN Laverda Sorenson. Dr Nani Ravens will contact pt's sig other, [name] and inform him of pt changes.   RN Laverda Sorenson,  took vitals at 1500 BP 53/40, 1505 BP 58/38  Notified MD and RR RN (see their notes) see VS flowsheet for others.  Pt was started on IV NS bolus.

## 2019-11-10 NOTE — Progress Notes (Signed)
Renal Navigator received message from [name]/significant other and attempted to return call but had to leave a message.   Alphonzo Cruise, [place] Renal Navigator [phone]

## 2019-11-10 NOTE — Progress Notes (Signed)
Renal Navigator has spoken with nursing and understands that patient is not doing well at this time. MD is attempting to contact significant other, but he is not answering. Renal Navigator received call from [facility] asking for APS worker's name and number. Navigator asked Jaclyn Day if he has received call from MD and if he is on his way back to the hospital. He states that he just got home. Navigator told Jaclyn Day that Jaclyn Day is "not doing well." It did not appear to Navigator that Jaclyn Day was grasping the gravity of the situation and he agreed that she was not having a good day. Renal Navigator asked Jaclyn Day to answer the phone when it rings and notified MD to try calling again.  Renal Navigator notified Jaclyn Day/PMT of patient's status and together we met with patient and Jaclyn Day when he arrived. Jaclyn Day was tearful and though he describes Jaclyn Day as the "strongest person I know," and "a fighter" he understands she is dying. He told her that if it was time for her to stop fighting it would be ok. He agreed for measures to be put in place to help keep her comfortable as she was clearly having increased work of breathing and uncomfortable with O2 mask on her face. He stated that he did not want her to suffer. Navigator provided condolences and sat with Jaclyn Day and patient while he spoke about how much he has loved her all these years and done the best by her that he has known how. Navigator encouraged him to be in the moment with Jaclyn Day when he started to talk about regrets and things he wished he had done differently. Jaclyn Day expressed his appreciation for support from Renal Navigator.  Adult Materials engineer updated on patient's medical status.  Jaclyn Day, [place] Renal Navigator [phone]

## 2019-11-10 NOTE — Significant Event (Signed)
Rapid Response Event Note  Overview: "Second Set of Eyes"  Initial Focused Assessment: Nursing staff called with concerns of patient's BP being low - SBP 50s and agonal breathing. Nurse confirmed that the patient was DNR. I came to the bedside and saw that the patient was exhibiting some cheyenne-stokes breathing, was having brief periods of apnea, HR was in the 80s earlier, now is in the 55-60 range. Patient did respond to painful stimuli, after which she did squeeze my hands briefly. Very weak gag was present. Blood sugar was 72. Pulses were very weak/thready, but I  could palpate them. Lung sounds clear, RR fluctuates between 8-10 and 22-26 (sometimes its slow and shallow, other times fast and shallow), 94% on VM 10L MD came to the bedside and is going to call and update the family.   Interventions: -- MD ordered NS 500 cc bolus   Plan of Care: -- Monitor VS as ordered per MD  -- Rest per medical team  Event Summary:  Start Time 1507 Arrival Time 1508 End Time [place]   Jaclyn Day

## 2019-11-10 NOTE — Progress Notes (Signed)
Called w hypotensive BP readings, will bolus 500 mL NS and reck manually. Cont midodrine.

## 2019-11-10 NOTE — Progress Notes (Addendum)
Pt lethargic this am, as was during the night as night RN reported. Pt on venti mask, sats 94-97%. Pt will arouse to name and touching --opens eyes or raises eyebrows, sometimes moans/speaks little, not alert enough to take po's.  Pt's significant other, [name] at bedside. Pt's temperature low this am 96.1 ax/rectal 95.6, BP 95/58(BP improved from last night and had been pt's baseline).  Dr Nani Ravens notified of the above and that pt's sig other, Konrad Dolores was here and wanting to speak with the doctor.

## 2019-11-10 NOTE — Progress Notes (Signed)
Daily Progress Note   Patient Name: Jaclyn Day       Date: 11/10/2019 DOB: [birth year]  Age: 74 y.o. MRN#: [id number] Attending Physician: Shelda Pal,* Primary Care Physician: Cyndi Bender, PA-C Admit Date: 10/28/2019  Reason for Consultation/Follow-up: Establishing goals of care  Subjective: Called to bedside by renal navigator. Found patient nonresponsive, with agonal respirations- actively dying. BP systolic in low 30'Z. Met with HCPOA [name]- explained that patient was dying. Recommended comfort measures to ensure she does not suffer while she dies. With some reluctance, [name] agreeable to comfort measures including IV hydromorphone for air hunger. Explained that we would not continue with IV fluids or dialysis. [name] asked about tube feeding. Discussed with him that tube feeding and other interventions would not change patient's trajectory. Main goal would be to ensure patient does not suffer while dying- she is DNR. Konrad Dolores states he does not want her to suffer, agrees with comfort measures.  [name] understandably grieving- gave emotional support.   ROS  Length of Stay: 3  Current Medications: Scheduled Meds:  . brimonidine  1 drop Both Eyes BID  . Chlorhexidine Gluconate Cloth  6 each Topical Q0600  . mouth rinse  15 mL Mouth Rinse BID  . scopolamine  1 patch Transdermal Q72H  . sodium chloride flush  3 mL Intravenous Q12H    Continuous Infusions: . cefTRIAXone (ROCEPHIN)  IV 1 g (11/09/19 2004)    PRN Meds: acetaminophen **OR** acetaminophen, camphor-menthol **AND** hydrOXYzine, guaiFENesin, HYDROmorphone (DILAUDID) injection, ondansetron **OR** ondansetron (ZOFRAN) IV  Physical Exam Vitals and nursing note reviewed.  Constitutional:      General: She is in  acute distress.     Appearance: She is ill-appearing.  Cardiovascular:     Rate and Rhythm: Rhythm irregular.     Comments: Pulses weak, thready Pulmonary:     Comments: Agonal respirations Skin:    Comments: Skin with grey tinge  Neurological:     Comments: nonresponsive             Vital Signs: BP (!) 57/44   Pulse 63   Temp (!) 95.6 F (35.3 C) (Rectal)   Resp (!) 24   Ht 5' 6" (1.676 m)   Wt 83.9 kg   SpO2 90%   BMI 29.86 kg/m  SpO2: SpO2: 90 % O2  Device: O2 Device: Venturi Mask O2 Flow Rate: O2 Flow Rate (L/min): 10 L/min  Intake/output summary:   Intake/Output Summary (Last 24 hours) at 11/10/2019 1755 Last data filed at 11/10/2019 1230 Gross per 24 hour  Intake 0 ml  Output 0 ml  Net 0 ml   LBM: Last BM Date: 11/08/19 Baseline Weight: Weight: 88.9 kg Most recent weight: Weight: 83.9 kg       Palliative Assessment/Data: PPS: 10%      Patient Active Problem List   Diagnosis Date Noted  . Adult failure to thrive   . ESRD (end stage renal disease) on dialysis ([place]) 11/07/2019  . Non-compliance with renal dialysis ([place]) 11/04/2019  . Anemia associated with acute blood loss 09/26/2019  . Hyperlipidemia 09/26/2019  . Schizoaffective disorder ([place]) 09/26/2019  . Cerebral embolism with cerebral infarction 08/22/2019  . Advanced care planning/counseling discussion   . Goals of care, counseling/discussion   . Palliative care by specialist   . Closed fracture of multiple ribs   . Malnutrition of moderate degree 08/16/2019  . Acute bacterial endocarditis   . Acute on chronic combined systolic and diastolic CHF (congestive heart failure) ([place])   . MSSA bacteremia 08/07/2019  . Acute renal failure superimposed on stage 4 chronic kidney disease ([place])   . Elevated troponin   . Persistent atrial fibrillation ([place])   . Acute respiratory failure with hypoxia ([place])   . History of cardiac arrest 08/03/2019  . Pressure injury of skin 08/03/2019  . Atrial  fibrillation with RVR ([place]) 01/25/2019  . CHF (congestive heart failure) ([place]) 01/25/2019  . Hypoxia   . Renal insufficiency   . Renal disorder   . Hypertension     Palliative Care Assessment & Plan   Patient Profile:  74 y.o. female  admitted on 10/18/2019 with a past medical history of end-stage renal disease on dialysis, CHF, atrial fibrillation, hypertension, depression/anxiety, and schizoaffective disorder. She had been admitted from 10/22-12/13 with cardiac arrest. She had been physically deconditioned and found to have new CHF with reduced ejection fraction.She was discharged from rehab/[facility] in early January and was discharged home to live with her significant other of 25 years.  She never was brought back to dialysis for  reasons that are now trying to be deciphered. Seems that EMS was triggered because she was never brought to dialysis and when they went to her home, conditions were such that Adult Protective Services were involved. Palliative medicine consulted for goals of care  Assessment/Recommendations/Plan   Patient actively dying  Hydromorphone 66m q399m prn for SOB, agitation or signs of discomfort  Plan to titrate O2 to nasal cannula- use hydromorphone for air hunger  Goals of Care and Additional Recommendations:  Limitations on Scope of Treatment: Full Comfort Care  Code Status:  DNR  Prognosis:   Hours - Days  Discharge Planning:  Anticipated Hospital Death  Care plan was discussed with attending MD, renal navigator, and patient's HCPOA.   Thank you for allowing the Palliative Medicine Team to assist in the care of this patient.   Time In: 1700 Time Out: 1800 Total Time 60 minutes Prolonged Time Billed yes      Greater than 50%  of this time was spent counseling and coordinating care related to the above assessment and plan.  KaMariana KaufmanAGNP-C Palliative Medicine   Please contact Palliative Medicine Team phone at [phone]  questions and concerns.

## 2019-11-10 NOTE — Progress Notes (Signed)
Renal Navigator received call back from patient's boyfriend/[name]. He states he has received a call from the [place] worker and will attend meeting on Tuesday 11/14/19 regarding patient. He states APS has never been involved previously. Navigator states this meeting will be held most likely in the conference room on 76M, but that Navigator will confirm and let him know. [name] spoke about his confusion about a question regarding patient's aid and that he was not satisfied about patient's aid. Navigator did not know what he was referring to, but stated we could discuss this in the meeting.  Konrad Dolores states Jaclyn Day is tired today and is sleeping a lot. He states she has been hard to arouse and thinks she is having a hard day.  Alphonzo Cruise, [place] Renal Navigator [phone]

## 2019-11-10 NOTE — Discharge Instructions (Signed)

## 2019-11-10 NOTE — Progress Notes (Signed)
[facility] Progress Note   Dialysis Orders: [facility] TTS 4h 400/800 74.5kg 2/2 bath TDC Hep 2300 venofer 50 qwk mircera 100 ug q2, last 1/3 last HD 10/17/19  Assessment/Plan: 1. ESRD TTS - missed 2-3 wks HD w/ uremia corrected since admission. . Better w/ HD. Pt sig debilitated, palliative care consulted for [place].  Living situation is in question as well. Per SW/ APS. NExt HD Saturday 2. BP volume  - bp's low here , ordered midodrine. Was on metop at home. 3. UTI - E Coli and proteus mirabilis, started on IV Rocephin  Started 1/27 WBC remains  ^ , hypothermic this am.BC not drawn - given low temp, persistent WBC and TDC - would consider drawing BC 4. Cough/ abnormal CXR- w/ bilat effusions, zithromax added 1/28  to Rocephin for CAP coverage - essentially unable to UF any volume Tues and Thursday due to low BP; was + 395 yesterday 5. Atrial fib - rate controlled on amio, holding BB - avoid low K 6. Recent MSSA bacteremia - treated, no sx's of recurrence 7. Cognitive impairment - seen by psychiatry, not competent to make her own decisions 8. Recent cardiac arrest/ CVA - improved, but deconditioned. PT/OT consulted per pmd, not responsive today -  9. Thrombocytopenia- continue to trend; INR 2.3 and NOT on coumadin - has been on Eliquis 10. DNR/Palliative care consulted.  Myriam Jacobson, PA-C [facility] Beeper [phone] 11/10/2019,10:35 AM  LOS: 3 days   Subjective:   Seen in room.  Poorly responsive today. Started on venturi mask last pm. Sats ok rate controlled AFib on tele. No intake today.  Objective Vitals:   11/10/19 0537 11/10/19 0944 11/10/19 1020 11/10/19 1023  BP: (!) 85/59 (!) 95/58    Pulse: 84 64    Resp: (!) 22 18    Temp: 97.7 F (36.5 C)  (!) 96.1 F (35.6 C) (!) 95.6 F (35.3 C)  TempSrc: Oral  Axillary Rectal  SpO2: 93% 96%    Weight:      Height:       Physical Exam General: chronically ill pale elderly female. eyes  closed appears to be in no distress at present.  Wakens to sternal rub Heart: irreg Lungs: rhonchi on the right Abdomen: soft NT Extremities: no LE edema bruising R > L UE Dialysis Access: right IJ [facility]    Additional Objective Labs: Basic Metabolic Panel: Recent Labs  Lab 11/08/19 0705 11/09/19 1312 11/10/19 0705  NA 135 138 138  K 3.2* 3.8 4.0  CL 99 102 105  CO2 21* 23 27  GLUCOSE 71 103* 90  BUN 34* 50* 25*  CREATININE 3.62* 4.97* 3.18*  CALCIUM 8.2* 8.0* 8.1*  PHOS  --  5.4* 4.6   Liver Function Tests: Recent Labs  Lab 10/31/2019 1345 11/09/19 1312 11/10/19 0705  AST 11*  --   --   ALT 11  --   --   ALKPHOS 75  --   --   BILITOT 0.3  --   --   PROT 6.5  --   --   ALBUMIN 2.5* 2.0* 1.9*   No results for input(s): LIPASE, AMYLASE in the last 168 hours. CBC: Recent Labs  Lab 10/24/2019 1345 10/31/2019 1428 11/07/19 0553 11/07/19 0553 11/07/19 1752 11/07/19 1752 11/08/19 0705 11/09/19 1312 11/10/19 0705  WBC 15.0*   < > 14.5*   < > 16.0*   < > 18.4* 20.6* 18.4*  NEUTROABS 11.1*  --   --   --   --   --   --   --   --  HGB 7.6*   < > 6.8*   < > 8.7*   < > 9.5* 8.9* 8.6*  HCT 26.0*   < > 21.4*   < > 27.3*   < > 29.9* 29.4* 27.9*  MCV 99.2   < > 93.0  --  93.2  --  94.9 98.0 98.9  PLT 218   < > 167   < > 145*   < > 139* 117* 105*   < > = values in this interval not displayed.   Blood Culture    Component Value Date/Time   SDES URINE, CATHETERIZED 10/20/2019 1607   SPECREQUEST  11/02/2019 1607    NONE Performed at [facility], [place] [address], [place], [place] [address]    CULT (A) 11/04/2019 1607    >=100,000 COLONIES/mL ESCHERICHIA COLI >=100,000 COLONIES/mL PROTEUS MIRABILIS    REPTSTATUS 11/08/2019 FINAL 10/31/2019 1607    Cardiac Enzymes: No results for input(s): CKTOTAL, CKMB, CKMBINDEX, TROPONINI in the last 168 hours. CBG: No results for input(s): GLUCAP in the last 168 hours. Iron Studies: No results for input(s): IRON, TIBC,  TRANSFERRIN, FERRITIN in the last 72 hours. Lab Results  Component Value Date   INR 2.3 (H) 11/04/2019   INR 2.3 (H) 09/24/2019   INR 2.3 (H) 09/23/2019   Studies/Results: DG CHEST PORT 1 VIEW  Result Date: 11/09/2019 CLINICAL DATA:  Hypoxia. EXAM: PORTABLE CHEST 1 VIEW COMPARISON:  Chest radiograph 11/08/2019 FINDINGS: Unchanged position of a right IJ approach hemodialysis catheter with tip projecting in the region of the superior cavoatrial junction. Overlying cardiac monitoring leads. Unchanged cardiomegaly. Aortic atherosclerosis. Without significant interval change, there are bilateral pleural effusions with bibasilar atelectasis. No evidence of pneumothorax. No acute bony abnormality. IMPRESSION: No significant interval change as compared to chest radiograph 11/08/2019. Persistent moderate bilateral pleural effusions with bibasilar atelectasis. Pneumonia at the lung bases cannot be excluded. Unchanged cardiomegaly. Electronically Signed   By: Kellie Simmering DO   On: 11/09/2019 10:16   DG CHEST PORT 1 VIEW  Result Date: 11/08/2019 CLINICAL DATA:  Cough today. Recently missed hemodialysis. History of congestive heart failure and atrial fibrillation. EXAM: PORTABLE CHEST 1 VIEW COMPARISON:  Radiographs 11/04/2019 and 08/31/2019. CT 11/06/2018. FINDINGS: 1206 hours. Right IJ hemodialysis catheter tip is unchanged at the level of the superior cavoatrial junction. There is stable cardiomegaly and aortic atherosclerosis. Interval enlargement of a right pleural effusion, tracking into the fissures. Associated increased right basilar pulmonary opacity. Small left pleural effusion and left basilar pulmonary opacity are unchanged. No edema or pneumothorax. There are glenohumeral degenerative changes bilaterally with evidence of a chronic rotator cuff tear on the left. IMPRESSION: Interval enlargement of right pleural effusion and right basilar pulmonary opacity. No other significant changes. Electronically  Signed   By: Richardean Sale M.D.   On: 11/08/2019 12:22   Medications: . azithromycin 250 mL/hr at 11/09/19 1639  . cefTRIAXone (ROCEPHIN)  IV 1 g (11/09/19 2004)   . amiodarone  200 mg Oral Daily  . apixaban  2.5 mg Oral BID  . ARIPiprazole  10 mg Oral BID  . atorvastatin  20 mg Oral Daily  . brimonidine  1 drop Both Eyes BID  . Chlorhexidine Gluconate Cloth  6 each Topical Daily  . Chlorhexidine Gluconate Cloth  6 each Topical Q0600  . Chlorhexidine Gluconate Cloth  6 each Topical Q0600  . dorzolamide-timolol  1 drop Both Eyes BID  . feeding supplement (NEPRO CARB STEADY)  237 mL Oral BID BM  .  feeding supplement (PRO-STAT SUGAR FREE 64)  30 mL Oral Daily  . mouth rinse  15 mL Mouth Rinse BID  . midodrine  10 mg Oral TID WC  . scopolamine  1 patch Transdermal Q72H  . sodium chloride flush  3 mL Intravenous Q12H  . valproic acid  250 mg Oral Q6H

## 2019-11-10 NOTE — Progress Notes (Signed)
Dr Nani Ravens did come up to see pt/sig other [name] and spoke with him on her status.

## 2019-11-10 NOTE — Progress Notes (Signed)
RN went to get vitals on pt. Pt current BP is 53/40 in her R arm, RR is 22, pt is currently on 10L w/ venturi mask. PT responds to painful stimulus only. Pulses are w weak. MD notified. Rapid response called ( pease see their notes). 500 Ml bolus administered. Will continue to monitor.

## 2019-11-10 NOTE — Progress Notes (Signed)
Called to bedside regarding BP's in [id number] systolic. Pt still not very responsive. RRT evaluating patient. 500 mL NS bolus ordered. She is starting to have some swelling in her LE's. Prognosis is very poor. Tried several times getting ahold of POA, BF [name] without success. I finally was able to reach him and relayed the morbid situation. He is still of the mindset that she will get better. He is returning to the hospital.

## 2019-11-10 NOTE — Progress Notes (Signed)
PROGRESS NOTE    Jaclyn Day  [id number] DOB: 1946/07/23 DOA: 10/22/2019 PCP: Cyndi Bender, PA-C     Brief Narrative:  Patient is a 74 year old female with a past medical history of end-stage renal disease on dialysis, CHF, atrial fibrillation, hypertension, depression/anxiety, and schizoaffective disorder.  She left rehab in early January.  She is never brought back for dialysis as she did not think she would need it forever.  She had a cough upon admission.  She had been admitted from 10/22-12/13 with cardiac arrest.  She had been physically deconditioned and found to have new CHF with reduced ejection fraction.  Patient had dialysis on 1/26, 1/28.  She is noted to be a poor candidate for the nephrology team.  UTI was noted on culture and Rocephin started on 1/27.  The following day she started to work harder to breathe requiring supplemental oxygen.  Chest x-ray showed bilateral pleural effusions and possible pneumonia.  Azithromycin was added to Rocephin.  Her pressures have been low throughout her stay and she had been cautiously hydrated to raise this.  Midodrine was started on 1/28.   New events last 24 hours / Subjective: Pressures continue to hover around [id number] systolic, she is less responsive today.  She had hemodialysis yesterday.  Her boyfriend, Konrad Dolores is bedside.  He believes that she will get better and that she just needs more strength.  When discussing the pleural effusion noted on x-ray, he would like to have it removed.  Assessment & Plan:  Principal Problem:   Non-compliance with renal dialysis ([place])             Appreciate social work APS involved HD 1/26, 1/28, appreciate nephrology Psych determined she lacks capacity to make her own decisions Appreciate palliative care  Active Problems:   Hypotension             Currently stable    Acute respiratory failure             Scope patch for  secretions  Azithro+Rocephin             DuoNebs             Changed to Ventimask, keep oxygen saturations 88% or higher    Pleural effusion  IR consulted, discussed with POA risk of dropping BP more  Acute cystitis Rocephin 1 g/d; day 2  CHF (congestive heart failure) [facility]) Cardiology has signed off  Persistent atrial fibrillation (HCC) Continue amiodarone 200 mg daily Eliquis 2.5mg  daily  Hyperlipidemia Lipitor 20 mg daily  Schizoaffective disorder ([place]) Continue Abilify 10mg  twice daily  ESRD (end stage renal disease) on dialysis ([place]) As above  Stroke associated seizure prophylaxis Continue valproic acid 250 mg every 6 hours  Physical deconditioning PT/OT    In agreement with assessment of the pressure ulcer as below:  Pressure Injury 10/15/2019 Pretibial Right;Lateral Unstageable - Full thickness tissue loss in which the base of the injury is covered by slough (yellow, tan, gray, green or brown) and/or eschar (tan, brown or black) in the wound bed. (Active)  11/04/2019 2100  Location: Pretibial  Location Orientation: Right;Lateral  Staging: Unstageable - Full thickness tissue loss in which the base of the injury is covered by slough (yellow, tan, gray, green or brown) and/or eschar (tan, brown or black) in the wound bed.  Wound Description (Comments):   Present on Admission: Yes     Pressure Injury 11/08/2019 Pretibial Right;Lateral Unstageable - Full thickness tissue loss in which the base  of the injury is covered by slough (yellow, tan, gray, green or brown) and/or eschar (tan, brown or black) in the wound bed. (Active)  11/12/2019 2100  Location: Pretibial  Location Orientation: Right;Lateral  Staging: Unstageable - Full thickness tissue loss in which the base of the injury is covered by slough (yellow, tan, gray, green or  brown) and/or eschar (tan, brown or black) in the wound bed.  Wound Description (Comments):   Present on Admission:      Pressure Injury 10/24/2019 Heel Left;Lateral Stage 2 -  Partial thickness loss of dermis presenting as a shallow open injury with a red, pink wound bed without slough. (Active)  10/31/2019 2100  Location: Heel  Location Orientation: Left;Lateral  Staging: Stage 2 -  Partial thickness loss of dermis presenting as a shallow open injury with a red, pink wound bed without slough.  Wound Description (Comments):   Present on Admission: Yes     Pressure Injury 10/14/2019 Buttocks Right;Lower Stage 2 -  Partial thickness loss of dermis presenting as a shallow open injury with a red, pink wound bed without slough. (Active)  11/12/2019 2100  Location: Buttocks  Location Orientation: Right;Lower  Staging: Stage 2 -  Partial thickness loss of dermis presenting as a shallow open injury with a red, pink wound bed without slough.  Wound Description (Comments):   Present on Admission: Yes     DVT prophylaxis: Eliquis Code Status: DNR Family Communication: Boyfriend [name] Stutts Disposition Plan: SNF; SNF; barriers to d/c include poor family support, hypotension, and now hypoxemia; She is coming from homeafter leaving SNF; I do not believe [name], her power of attorney, has a good grasp of her situation.  There is a meeting on Tuesday, 2/2 at 1 PM, with APS to discuss goals of care.    Consultants:   Nephrology  Psychiatry  Palliative Care  Procedures:   HD 1/26, 1/28  Antimicrobials:  Anti-infectives (From admission, onward)   Start     Dose/Rate Route Frequency Ordered Stop   11/09/19 1045  azithromycin (ZITHROMAX) 500 mg in sodium chloride 0.9 % 250 mL IVPB     500 mg 250 mL/hr over 60 Minutes Intravenous Every 24 hours 11/09/19 1042     11/08/19 1700  cefTRIAXone (ROCEPHIN) 1 g in sodium chloride 0.9 % 100 mL IVPB     1 g 200 mL/hr over 30 Minutes Intravenous Every 24  hours 11/08/19 1502          Objective: Vitals:   11/10/19 0537 11/10/19 0944 11/10/19 1020 11/10/19 1023  BP: (!) 85/59 (!) 95/58    Pulse: 84 64    Resp: (!) 22 18    Temp: 97.7 F (36.5 C)  (!) 96.1 F (35.6 C) (!) 95.6 F (35.3 C)  TempSrc: Oral  Axillary Rectal  SpO2: 93% 96%    Weight:      Height:        Intake/Output Summary (Last 24 hours) at 11/10/2019 1346 Last data filed at 11/10/2019 0800 Gross per 24 hour  Intake 800 ml  Output -395 ml  Net 1195 ml   Filed Weights   11/09/19 0800 11/09/19 1300 11/09/19 2106  Weight: 83.4 kg 83.4 kg 83.9 kg    Examination:  General exam: Appears somnolent Respiratory system: Clear to auscultation. Respiratory effort normal. No respiratory distress. No conversational dyspnea.  Cardiovascular system: S1 & S2 heard, regular rate, irregularly irregular rhythm. No pedal edema. Gastrointestinal system: Abdomen is mildly distended, soft and nontender. Normal  bowel sounds heard. Central nervous system: Will awaken to verbal stimuli Extremities: Symmetric in appearance  Skin: No rashes, lesions or ulcers on exposed skin  Psychiatry: Judgement and insight appear normal. Mood & affect appropriate.   Data Reviewed: I have personally reviewed following labs and imaging studies  CBC: Recent Labs  Lab 10/16/2019 1345 11/05/2019 1428 11/07/19 0553 11/07/19 1752 11/08/19 0705 11/09/19 1312 11/10/19 0705  WBC 15.0*   < > 14.5* 16.0* 18.4* 20.6* 18.4*  NEUTROABS 11.1*  --   --   --   --   --   --   HGB 7.6*   < > 6.8* 8.7* 9.5* 8.9* 8.6*  HCT 26.0*   < > 21.4* 27.3* 29.9* 29.4* 27.9*  MCV 99.2   < > 93.0 93.2 94.9 98.0 98.9  PLT 218   < > 167 145* 139* 117* 105*   < > = values in this interval not displayed.   Basic Metabolic Panel: Recent Labs  Lab 11/05/2019 1345 10/29/2019 1345 10/18/2019 1428 11/07/19 0553 11/08/19 0705 11/09/19 1312 11/10/19 0705  NA 142   < > 138 138 135 138 138  K 4.5   < > 4.4 5.4* 3.2* 3.8 4.0  CL  108   < > 108 106 99 102 105  CO2 16*  --   --  13* 21* 23 27  GLUCOSE 126*   < > 118* 81 71 103* 90  BUN 109*   < > 115* 114* 34* 50* 25*  CREATININE 8.01*   < > 8.30* 8.14* 3.62* 4.97* 3.18*  CALCIUM 8.9  --   --  8.2* 8.2* 8.0* 8.1*  PHOS  --   --   --   --   --  5.4* 4.6   < > = values in this interval not displayed.   GFR: Estimated Creatinine Clearance: 17.4 mL/min (A) (by C-G formula based on SCr of 3.18 mg/dL (H)). Liver Function Tests: Recent Labs  Lab 11/05/2019 1345 11/09/19 1312 11/10/19 0705  AST 11*  --   --   ALT 11  --   --   ALKPHOS 75  --   --   BILITOT 0.3  --   --   PROT 6.5  --   --   ALBUMIN 2.5* 2.0* 1.9*  . Coagulation Profile: Recent Labs  Lab 10/29/2019 1836  INR 2.3*    Recent Results (from the past 240 hour(s))  Urine culture     Status: Abnormal   Collection Time: 10/16/2019  4:07 PM   Specimen: Urine, Catheterized  Result Value Ref Range Status   Specimen Description URINE, CATHETERIZED  Final   Special Requests   Final    NONE Performed at [facility], [address] [place], [place] [address]    Culture (A)  Final    >=100,000 COLONIES/mL ESCHERICHIA COLI >=100,000 COLONIES/mL PROTEUS MIRABILIS    Report Status 11/08/2019 FINAL  Final   Organism ID, Bacteria ESCHERICHIA COLI (A)  Final   Organism ID, Bacteria PROTEUS MIRABILIS (A)  Final      Susceptibility   Escherichia coli - MIC*    AMPICILLIN 4 SENSITIVE Sensitive     CEFAZOLIN <=4 SENSITIVE Sensitive     CEFTRIAXONE <=0.25 SENSITIVE Sensitive     CIPROFLOXACIN <=0.25 SENSITIVE Sensitive     GENTAMICIN <=1 SENSITIVE Sensitive     IMIPENEM <=0.25 SENSITIVE Sensitive     NITROFURANTOIN <=16 SENSITIVE Sensitive     TRIMETH/SULFA <=20 SENSITIVE Sensitive  AMPICILLIN/SULBACTAM <=2 SENSITIVE Sensitive     PIP/TAZO <=4 SENSITIVE Sensitive     * >=100,000 COLONIES/mL ESCHERICHIA COLI   Proteus mirabilis - MIC*    AMPICILLIN >=32 RESISTANT Resistant     CEFAZOLIN >=64  RESISTANT Resistant     CEFTRIAXONE <=0.25 SENSITIVE Sensitive     CIPROFLOXACIN <=0.25 SENSITIVE Sensitive     GENTAMICIN <=1 SENSITIVE Sensitive     IMIPENEM 2 SENSITIVE Sensitive     NITROFURANTOIN 128 RESISTANT Resistant     TRIMETH/SULFA <=20 SENSITIVE Sensitive     AMPICILLIN/SULBACTAM >=32 RESISTANT Resistant     PIP/TAZO <=4 SENSITIVE Sensitive     * >=100,000 COLONIES/mL PROTEUS MIRABILIS  Respiratory Panel by RT PCR (Flu A&B, Covid) - Nasopharyngeal Swab     Status: None   Collection Time: 10/25/2019  4:27 PM   Specimen: Nasopharyngeal Swab  Result Value Ref Range Status   SARS Coronavirus 2 by RT PCR NEGATIVE NEGATIVE Final    Comment: (NOTE) SARS-CoV-2 target nucleic acids are NOT DETECTED. The SARS-CoV-2 RNA is generally detectable in upper respiratoy specimens during the acute phase of infection. The lowest concentration of SARS-CoV-2 viral copies this assay can detect is 131 copies/mL. A negative result does not preclude SARS-Cov-2 infection and should not be used as the sole basis for treatment or other patient management decisions. A negative result may occur with  improper specimen collection/handling, submission of specimen other than nasopharyngeal swab, presence of viral mutation(s) within the areas targeted by this assay, and inadequate number of viral copies (<131 copies/mL). A negative result must be combined with clinical observations, patient history, and epidemiological information. The expected result is Negative. Fact Sheet for Patients:  PinkCheek.be Fact Sheet for Healthcare Providers:  GravelBags.it This test is not yet ap proved or cleared by the [place] FDA and  has been authorized for detection and/or diagnosis of SARS-CoV-2 by FDA under an Emergency Use Authorization (EUA). This EUA will remain  in effect (meaning this test can be used) for the duration of the COVID-19 declaration  under Section 564(b)(1) of the Act, 21 U.S.C. section 360bbb-3(b)(1), unless the authorization is terminated or revoked sooner.    Influenza A by PCR NEGATIVE NEGATIVE Final   Influenza B by PCR NEGATIVE NEGATIVE Final    Comment: (NOTE) The Xpert Xpress SARS-CoV-2/FLU/RSV assay is intended as an aid in  the diagnosis of influenza from Nasopharyngeal swab specimens and  should not be used as a sole basis for treatment. Nasal washings and  aspirates are unacceptable for Xpert Xpress SARS-CoV-2/FLU/RSV  testing. Fact Sheet for Patients: PinkCheek.be Fact Sheet for Healthcare Providers: GravelBags.it This test is not yet approved or cleared by the [place] FDA and  has been authorized for detection and/or diagnosis of SARS-CoV-2 by  FDA under an Emergency Use Authorization (EUA). This EUA will remain  in effect (meaning this test can be used) for the duration of the  Covid-19 declaration under Section 564(b)(1) of the Act, 21  U.S.C. section 360bbb-3(b)(1), unless the authorization is  terminated or revoked. Performed at [facility], [place] [address], [place], [place] [address]   MRSA PCR Screening     Status: None   Collection Time: 11/07/19  4:32 AM   Specimen: Nasal Mucosa; Nasopharyngeal  Result Value Ref Range Status   MRSA by PCR NEGATIVE NEGATIVE Final    Comment:        The GeneXpert MRSA Assay (FDA approved for NASAL specimens only), is one component of  a comprehensive MRSA colonization surveillance program. It is not intended to diagnose MRSA infection nor to guide or monitor treatment for MRSA infections. Performed at [facility], [place] [address], [place], [place] [address]       Radiology Studies: DG CHEST PORT 1 VIEW  Result Date: 11/09/2019 CLINICAL DATA:  Hypoxia. EXAM: PORTABLE CHEST 1 VIEW COMPARISON:  Chest radiograph 11/08/2019 FINDINGS: Unchanged position of a right IJ approach  hemodialysis catheter with tip projecting in the region of the superior cavoatrial junction. Overlying cardiac monitoring leads. Unchanged cardiomegaly. Aortic atherosclerosis. Without significant interval change, there are bilateral pleural effusions with bibasilar atelectasis. No evidence of pneumothorax. No acute bony abnormality. IMPRESSION: No significant interval change as compared to chest radiograph 11/08/2019. Persistent moderate bilateral pleural effusions with bibasilar atelectasis. Pneumonia at the lung bases cannot be excluded. Unchanged cardiomegaly. Electronically Signed   By: Kellie Simmering DO   On: 11/09/2019 10:16      Scheduled Meds: . amiodarone  200 mg Oral Daily  . apixaban  2.5 mg Oral BID  . ARIPiprazole  10 mg Oral BID  . atorvastatin  20 mg Oral Daily  . brimonidine  1 drop Both Eyes BID  . Chlorhexidine Gluconate Cloth  6 each Topical Q0600  . dorzolamide-timolol  1 drop Both Eyes BID  . feeding supplement (NEPRO CARB STEADY)  237 mL Oral BID BM  . feeding supplement (PRO-STAT SUGAR FREE 64)  30 mL Oral Daily  . mouth rinse  15 mL Mouth Rinse BID  . midodrine  10 mg Oral TID WC  . scopolamine  1 patch Transdermal Q72H  . sodium chloride flush  3 mL Intravenous Q12H  . valproic acid  250 mg Oral Q6H   Continuous Infusions: . azithromycin 500 mg (11/10/19 1110)  . cefTRIAXone (ROCEPHIN)  IV 1 g (11/09/19 2004)     LOS: 3 days    Time spent: 30 minutes   Shelda Pal, DO [facility] 11/10/2019, 1:46 PM   Available via Epic secure chat 7am-7pm After these hours, please refer to coverage provider listed on [email or web address]

## 2019-11-10 NOTE — Progress Notes (Signed)
PT Cancellation Note  Patient Details Name: Jaclyn Day MRN: [id number] DOB: [birth year]   Cancelled Treatment:    Reason Eval/Treat Not Completed: Medical issues which prohibited therapy.  Per nsg pt has been on Venti mask to supplement O2, and is lethargic.  Will hold PT for today, re-attempt at another time.   Ramond Dial 11/10/2019, 12:58 PM   Mee Hives, PT MS Acute Rehab Dept. Number: [place] [facility] [place]

## 2019-11-11 DIAGNOSIS — Z9115 Patient's noncompliance with renal dialysis: Secondary | ICD-10-CM

## 2019-11-11 DIAGNOSIS — Z515 Encounter for palliative care: Secondary | ICD-10-CM

## 2019-11-13 ENCOUNTER — Other Ambulatory Visit: Payer: Self-pay | Admitting: Adult Health

## 2019-11-13 DIAGNOSIS — I4891 Unspecified atrial fibrillation: Secondary | ICD-10-CM

## 2019-11-13 DIAGNOSIS — I6319 Cerebral infarction due to embolism of other precerebral artery: Secondary | ICD-10-CM

## 2019-11-13 DIAGNOSIS — D62 Acute posthemorrhagic anemia: Secondary | ICD-10-CM

## 2019-11-13 NOTE — Plan of Care (Signed)
°  Problem: Coping: °Goal: Level of anxiety will decrease °Outcome: Progressing °  °

## 2019-11-13 NOTE — Death Summary Note (Signed)
Death Summary  Jaclyn Day [id number] DOB: [birth year] DOA: 11-12-19  PCP: Cyndi Bender, PA-C  Admit date: 11/12/19 Date of Death: 2019/11/17 Time of Death: 0902 am  History of present illness:  Jaclyn Day is a 74 y.o. female with a history of stage renal disease on hemodialysis, A. fib DC cardioversion on Coumadin, hypertension, depression anxiety, she is affective disorder, DNR on admission, developmental disorder, diastolic CHF presented with missed hemodialysis from January 5 through 11/11/2022. Jaclyn Day presented with complaint of abnormal labs and shortness of breath and cough. Jaclyn Day did not improve after hemodialysis.  She had bilateral pleural effusion and possible pneumonia treated with IV antibiotics and midodrine.  Final Diagnoses:  Acute respiratory failure secondary to healthcare associated pneumonia and bilateral pleural effusion from missed hemodialysis treatments and congestive heart failure.   The results of significant diagnostics from this hospitalization (including imaging, microbiology, ancillary and laboratory) are listed below for reference.    Significant Diagnostic Studies: DG CHEST PORT 1 VIEW  Result Date: 11/09/2019 CLINICAL DATA:  Hypoxia. EXAM: PORTABLE CHEST 1 VIEW COMPARISON:  Chest radiograph 11/08/2019 FINDINGS: Unchanged position of a right IJ approach hemodialysis catheter with tip projecting in the region of the superior cavoatrial junction. Overlying cardiac monitoring leads. Unchanged cardiomegaly. Aortic atherosclerosis. Without significant interval change, there are bilateral pleural effusions with bibasilar atelectasis. No evidence of pneumothorax. No acute bony abnormality. IMPRESSION: No significant interval change as compared to chest radiograph 11/08/2019. Persistent moderate bilateral pleural effusions with bibasilar atelectasis. Pneumonia at the lung bases cannot be excluded. Unchanged cardiomegaly.  Electronically Signed   By: Kellie Simmering DO   On: 11/09/2019 10:16   DG CHEST PORT 1 VIEW  Result Date: 11/08/2019 CLINICAL DATA:  Cough today. Recently missed hemodialysis. History of congestive heart failure and atrial fibrillation. EXAM: PORTABLE CHEST 1 VIEW COMPARISON:  Radiographs November 12, 2019 and 08/31/2019. CT 11/06/2018. FINDINGS: 1206 hours. Right IJ hemodialysis catheter tip is unchanged at the level of the superior cavoatrial junction. There is stable cardiomegaly and aortic atherosclerosis. Interval enlargement of a right pleural effusion, tracking into the fissures. Associated increased right basilar pulmonary opacity. Small left pleural effusion and left basilar pulmonary opacity are unchanged. No edema or pneumothorax. There are glenohumeral degenerative changes bilaterally with evidence of a chronic rotator cuff tear on the left. IMPRESSION: Interval enlargement of right pleural effusion and right basilar pulmonary opacity. No other significant changes. Electronically Signed   By: Richardean Sale M.D.   On: 11/08/2019 12:22   DG Chest Port 1 View  Result Date: 2019/11/12 CLINICAL DATA:  Bruising to right shoulder. EXAM: PORTABLE CHEST 1 VIEW COMPARISON:  Chest x-ray dated August 31, 2019. FINDINGS: Unchanged tunneled right internal jugular dialysis catheter. Stable cardiomegaly. Improved bilateral pleural effusions, now small. Improved aeration at the right lung base. Persistent left basilar atelectasis. No pneumothorax. Healing left rib fractures. No acute osseous abnormality. IMPRESSION: Improved bilateral pleural effusions, now small. Electronically Signed   By: Titus Dubin M.D.   On: 11-12-19 14:17    Microbiology: Recent Results (from the past 240 hour(s))  Urine culture     Status: Abnormal   Collection Time: Nov 12, 2019  4:07 PM   Specimen: Urine, Catheterized  Result Value Ref Range Status   Specimen Description URINE, CATHETERIZED  Final   Special Requests   Final     NONE Performed at [facility], [address], [place], [place] [address]    Culture (A)  Final    >=  100,000 COLONIES/mL ESCHERICHIA COLI >=100,000 COLONIES/mL PROTEUS MIRABILIS    Report Status 11/08/2019 FINAL  Final   Organism ID, Bacteria ESCHERICHIA COLI (A)  Final   Organism ID, Bacteria PROTEUS MIRABILIS (A)  Final      Susceptibility   Escherichia coli - MIC*    AMPICILLIN 4 SENSITIVE Sensitive     CEFAZOLIN <=4 SENSITIVE Sensitive     CEFTRIAXONE <=0.25 SENSITIVE Sensitive     CIPROFLOXACIN <=0.25 SENSITIVE Sensitive     GENTAMICIN <=1 SENSITIVE Sensitive     IMIPENEM <=0.25 SENSITIVE Sensitive     NITROFURANTOIN <=16 SENSITIVE Sensitive     TRIMETH/SULFA <=20 SENSITIVE Sensitive     AMPICILLIN/SULBACTAM <=2 SENSITIVE Sensitive     PIP/TAZO <=4 SENSITIVE Sensitive     * >=100,000 COLONIES/mL ESCHERICHIA COLI   Proteus mirabilis - MIC*    AMPICILLIN >=32 RESISTANT Resistant     CEFAZOLIN >=64 RESISTANT Resistant     CEFTRIAXONE <=0.25 SENSITIVE Sensitive     CIPROFLOXACIN <=0.25 SENSITIVE Sensitive     GENTAMICIN <=1 SENSITIVE Sensitive     IMIPENEM 2 SENSITIVE Sensitive     NITROFURANTOIN 128 RESISTANT Resistant     TRIMETH/SULFA <=20 SENSITIVE Sensitive     AMPICILLIN/SULBACTAM >=32 RESISTANT Resistant     PIP/TAZO <=4 SENSITIVE Sensitive     * >=100,000 COLONIES/mL PROTEUS MIRABILIS  Respiratory Panel by RT PCR (Flu A&B, Covid) - Nasopharyngeal Swab     Status: None   Collection Time: 10/31/2019  4:27 PM   Specimen: Nasopharyngeal Swab  Result Value Ref Range Status   SARS Coronavirus 2 by RT PCR NEGATIVE NEGATIVE Final    Comment: (NOTE) SARS-CoV-2 target nucleic acids are NOT DETECTED. The SARS-CoV-2 RNA is generally detectable in upper respiratoy specimens during the acute phase of infection. The lowest concentration of SARS-CoV-2 viral copies this assay can detect is 131 copies/mL. A negative result does not preclude SARS-Cov-2 infection and should  not be used as the sole basis for treatment or other patient management decisions. A negative result may occur with  improper specimen collection/handling, submission of specimen other than nasopharyngeal swab, presence of viral mutation(s) within the areas targeted by this assay, and inadequate number of viral copies (<131 copies/mL). A negative result must be combined with clinical observations, patient history, and epidemiological information. The expected result is Negative. Fact Sheet for Patients:  PinkCheek.be Fact Sheet for Healthcare Providers:  GravelBags.it This test is not yet ap proved or cleared by the [place] FDA and  has been authorized for detection and/or diagnosis of SARS-CoV-2 by FDA under an Emergency Use Authorization (EUA). This EUA will remain  in effect (meaning this test can be used) for the duration of the COVID-19 declaration under Section 564(b)(1) of the Act, 21 U.S.C. section 360bbb-3(b)(1), unless the authorization is terminated or revoked sooner.    Influenza A by PCR NEGATIVE NEGATIVE Final   Influenza B by PCR NEGATIVE NEGATIVE Final    Comment: (NOTE) The Xpert Xpress SARS-CoV-2/FLU/RSV assay is intended as an aid in  the diagnosis of influenza from Nasopharyngeal swab specimens and  should not be used as a sole basis for treatment. Nasal washings and  aspirates are unacceptable for Xpert Xpress SARS-CoV-2/FLU/RSV  testing. Fact Sheet for Patients: PinkCheek.be Fact Sheet for Healthcare Providers: GravelBags.it This test is not yet approved or cleared by the [place] FDA and  has been authorized for detection and/or diagnosis of SARS-CoV-2 by  FDA under an Emergency Use Authorization (EUA). This EUA  will remain  in effect (meaning this test can be used) for the duration of the  Covid-19 declaration under Section 564(b)(1)  of the Act, 21  U.S.C. section 360bbb-3(b)(1), unless the authorization is  terminated or revoked. Performed at [facility], [place] [address] [place], [place] [address]   MRSA PCR Screening     Status: None   Collection Time: 11/07/19  4:32 AM   Specimen: Nasal Mucosa; Nasopharyngeal  Result Value Ref Range Status   MRSA by PCR NEGATIVE NEGATIVE Final    Comment:        The GeneXpert MRSA Assay (FDA approved for NASAL specimens only), is one component of a comprehensive MRSA colonization surveillance program. It is not intended to diagnose MRSA infection nor to guide or monitor treatment for MRSA infections. Performed at [facility], [place] [address] [place], [place] [address]      Labs: Basic Metabolic Panel: Recent Labs  Lab 11/03/2019 1345 11/10/2019 1345 11/01/2019 1428 10/30/2019 1428 11/07/19 0553 11/07/19 0553 11/08/19 0705 11/08/19 0705 11/09/19 1312 11/10/19 0705  NA 142   < > 138  --  138  --  135  --  138 138  K 4.5   < > 4.4   < > 5.4*   < > 3.2*   < > 3.8 4.0  CL 108   < > 108  --  106  --  99  --  102 105  CO2 16*  --   --   --  13*  --  21*  --  23 27  GLUCOSE 126*   < > 118*  --  81  --  71  --  103* 90  BUN 109*   < > 115*  --  114*  --  34*  --  50* 25*  CREATININE 8.01*   < > 8.30*  --  8.14*  --  3.62*  --  4.97* 3.18*  CALCIUM 8.9  --   --   --  8.2*  --  8.2*  --  8.0* 8.1*  PHOS  --   --   --   --   --   --   --   --  5.4* 4.6   < > = values in this interval not displayed.   Liver Function Tests: Recent Labs  Lab 10/29/2019 1345 11/09/19 1312 11/10/19 0705 11/10/19 1324  AST 11*  --   --   --   ALT 11  --   --   --   ALKPHOS 75  --   --   --   BILITOT 0.3  --   --   --   PROT 6.5  --   --  5.1*  ALBUMIN 2.5* 2.0* 1.9*  --    No results for input(s): LIPASE, AMYLASE in the last 168 hours. No results for input(s): AMMONIA in the last 168 hours. CBC: Recent Labs  Lab 10/19/2019 1345 10/23/2019 1428 11/07/19 0553 11/07/19 1752  11/08/19 0705 11/09/19 1312 11/10/19 0705  WBC 15.0*   < > 14.5* 16.0* 18.4* 20.6* 18.4*  NEUTROABS 11.1*  --   --   --   --   --   --   HGB 7.6*   < > 6.8* 8.7* 9.5* 8.9* 8.6*  HCT 26.0*   < > 21.4* 27.3* 29.9* 29.4* 27.9*  MCV 99.2   < > 93.0 93.2 94.9 98.0 98.9  PLT 218   < > 167  145* 139* 117* 105*   < > = values in this interval not displayed.   Cardiac Enzymes: No results for input(s): CKTOTAL, CKMB, CKMBINDEX, TROPONINI in the last 168 hours. D-Dimer No results for input(s): DDIMER in the last 72 hours. BNP: Invalid input(s): POCBNP CBG: Recent Labs  Lab 11/10/19 1517  GLUCAP 72   Anemia work up No results for input(s): VITAMINB12, FOLATE, FERRITIN, TIBC, IRON, RETICCTPCT in the last 72 hours. Urinalysis    Component Value Date/Time   COLORURINE YELLOW 11/02/2019 1607   APPEARANCEUR TURBID (A) 10/30/2019 1607   LABSPEC 1.019 10/15/2019 1607   PHURINE 7.0 10/13/2019 1607   GLUCOSEU NEGATIVE 10/24/2019 1607   HGBUR NEGATIVE 10/31/2019 1607   BILIRUBINUR NEGATIVE 10/30/2019 1607   KETONESUR 5 (A) 10/29/2019 1607   PROTEINUR >=300 (A) 11/08/2019 1607   UROBILINOGEN 0.2 05/07/2009 1520   NITRITE NEGATIVE 11/02/2019 1607   LEUKOCYTESUR MODERATE (A) 11/05/2019 1607   Sepsis Labs Invalid input(s): PROCALCITONIN,  WBC,  LACTICIDVEN     SIGNED:  Georgette Shell, MD  [facility] 11-21-2019, 10:25 AM Pager   If 7PM-7AM, please contact night-coverage [email or web address] Password [email or web address]

## 2019-11-13 NOTE — Progress Notes (Signed)
Message left to Vassie Loll for call back re: patient.

## 2019-11-13 NOTE — Progress Notes (Signed)
DECEASED NOTE Assessment: On assessment patient had no respirations or heart sounds. Assessed patient pulse, which resulted in being pulseless. Extremities were cold to touch and mottled.  Confirmation: Kathyrn Sheriff, RN confirmed these findings as well.  Time of Death: [facility]: Called Family: [name] at bedside Patient Placement: Notified  Aneta Mins BSN, [place]

## 2019-11-13 NOTE — Progress Notes (Signed)
   Palliative Medicine Inpatient Follow Up Note   HPI: Per recent note --> 74 y.o.femaleadmitted on 1/25/2021with apast medical history of end-stage renal disease on dialysis, CHF, atrial fibrillation, hypertension, depression/anxiety, and schizoaffective disorder. She had been admitted from 10/22-12/13 with cardiac arrest. She had been physically deconditioned and found to have new CHF with reduced ejection fraction.[facility] and was discharged home to live with her significant otherof 25 years.She never was brought back to dialysisfor reasons that are now trying to be deciphered. Seems that EMS was triggered because she was never brought to dialysis and when they went to her home,conditions were such that Adult Protective Services were involved. Palliative medicine consulted for goals of care   Today's Discussion (12/03/19): Met with patients significant other, [name] at bedside. He shared that he was here overnight. He said that this morning miss Conyer opened her eyes and took a bite of food. He wonders if a feeding tube would help her. I shared with him that her body would likely not tolerate that well now as she appears to be active in the dying process. He seemed to understand this yet continued to vocalize statements of hope. They have been together for 27 years though never married. [name] stated that miss Buntings sister, Evie Lacks will be traveling from [facility] this afternoon to visit with her. Offered emotional support through therapeutic listening.  Discussed with Konrad Dolores the importance of continued conversation with family and their medical providers regarding overall plan of care ensuring decisions are within the context of the patients values and GOCs.   Vital Signs Vitals:   2019-12-03 0020 Dec 03, 2019 0647  BP: (!) 58/32 (!) 127/45  Pulse: 60 63  Resp:  20  Temp:    SpO2:      Intake/Output Summary (Last 24 hours)  at Dec 03, 2019 1055 Last data filed at Dec 03, 2019 7867 Gross per 24 hour  Intake 99.81 ml  Output 0 ml  Net 99.81 ml   Last Weight  Most recent update: 12-03-2019  9:11 AM   Weight  83.9 kg (185 lb)           Gen:  Somnolent HEENT: Intermittently opening eyes, dry mucous membranes CV: Regular rate and irregular rhythm, no murmurs rubs or gallops PULM: On supplemental O2, slight tachypnea  ABD: soft/nontender/nondistended/normal bowel sounds  EXT: No edema  Neuro: Lethargic, somnolent  Assessment/Recommendations/Plan   Comfort Measures  Patient actively dying  Hydromorphone 47m q372m prn for SOB, agitation or signs of discomfort  Plan to titrate O2 to nasal cannula- use hydromorphone for air hunger  Goals of Care and Additional Recommendations: ? Limitations on Scope of Treatment: Full Comfort Care  Code Status: ? DNR  Prognosis:   Hours - Days  Discharge Planning:  Anticipated Hospital Death  Time Spent: 25 Greater than 50% of the time was spent in counseling and coordination of care ______________________________________________________________________________________ [facility] Team Cell Phone: [phone] utilize secure chat with additional questions, if there is no response within 30 minutes please call the above phone number  Palliative Medicine Team providers are available by phone from 7am to 7pm daily and can be reached through the team cell phone.  Should this patient require assistance outside of these hours, please call the patient's attending physician.

## 2019-11-13 NOTE — Progress Notes (Signed)
Patient's boyfriend is at the bedside and said he would like to spend the night with patient. He was encouraged to call nurses' station if he needs anything.   Patsy Varma,RN.

## 2019-11-13 DEATH — deceased

## 2019-11-22 ENCOUNTER — Telehealth: Payer: 59 | Admitting: Cardiology

## 2019-12-05 DIAGNOSIS — N179 Acute kidney failure, unspecified: Secondary | ICD-10-CM

## 2020-06-10 IMAGING — MR MR HEAD W/O CM
12 of 13 series · 44 of 48 positions shown · non-contrast
Comparison: Head CTs 08/18/2019 and earlier.

CLINICAL DATA: 73-year-old female with altered mental status.
Cardiac arrest, query anoxic injury.

EXAM:
MRI HEAD WITHOUT CONTRAST
TECHNIQUE: Multiplanar, multiecho pulse sequences of the brain and surrounding
structures were obtained without intravenous contrast.

[Series 5: DWI · axial · 3.0mm · 0.92mm/px · z∈[-129,+23]mm · 9 of 104 slices shown (1 of 4)]
[im 1/104]
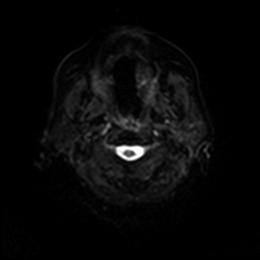
[im 13/104]
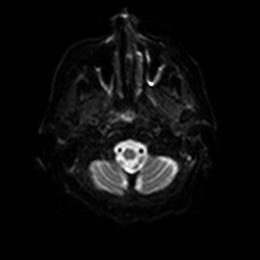
[im 26/104]
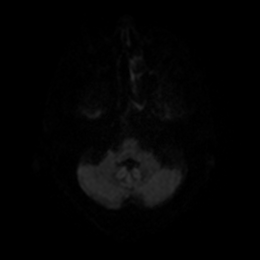
[im 39/104]
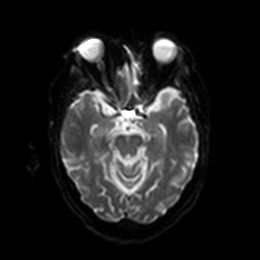
[im 52/104]
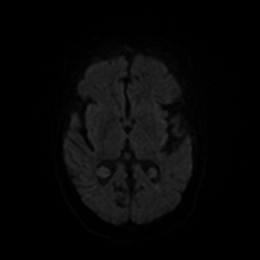
[im 65/104]
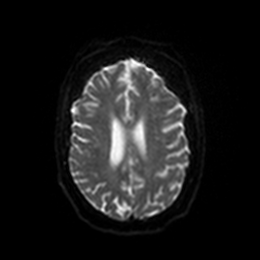
[im 78/104]
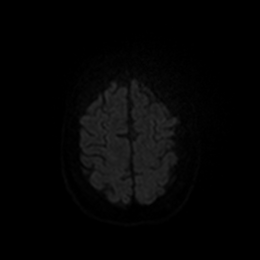
[im 91/104]
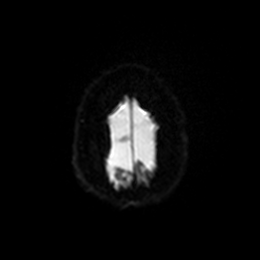
[im 104/104]
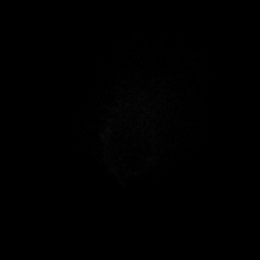

[Series 6: DWI · axial · 3.0mm · 0.92mm/px · z∈[-129,+23]mm · 4 of 52 slices shown (2 of 4)]
[im 1/52]
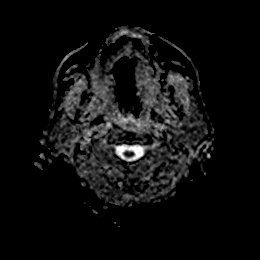
[im 18/52]
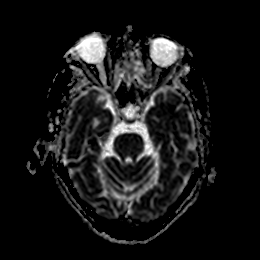
[im 35/52]
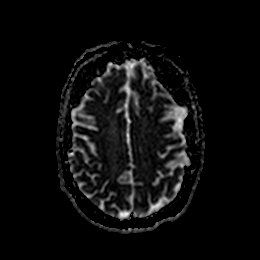
[im 52/52]
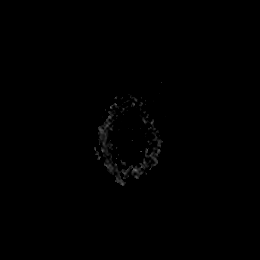

[Series 7: DWI · coronal · 4.0mm · 0.88mm/px · 6 of 78 slices shown (3 of 4)]
[im 1/78]
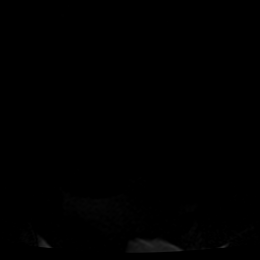
[im 16/78]
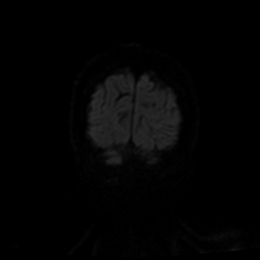
[im 31/78]
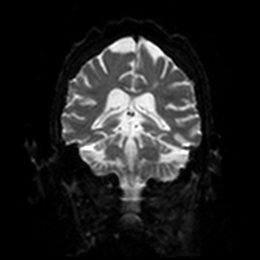
[im 47/78]
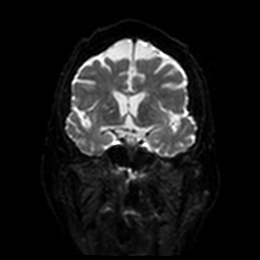
[im 62/78]
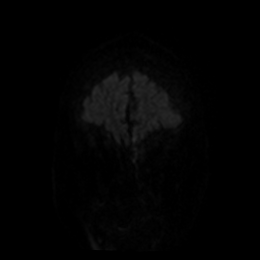
[im 78/78]
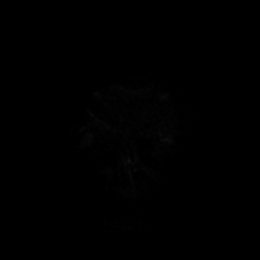

[Series 8: DWI · coronal · 4.0mm · 0.88mm/px · 3 of 39 slices shown (4 of 4)]
[im 1/39]
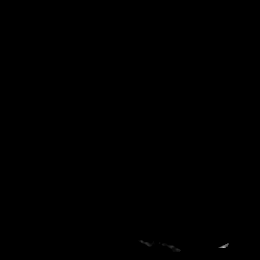
[im 20/39]
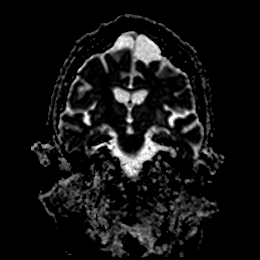
[im 39/39]
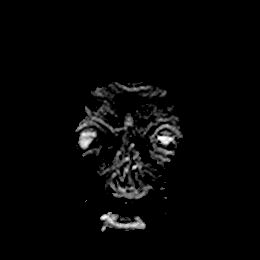

[Series 9: FLAIR · axial · 5.0mm · 0.47mm/px · z∈[-125,+24]mm · 2 of 26 slices shown]
[im 1/26]
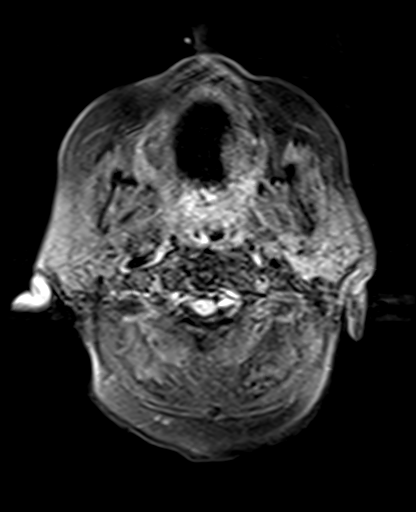
[im 26/26]
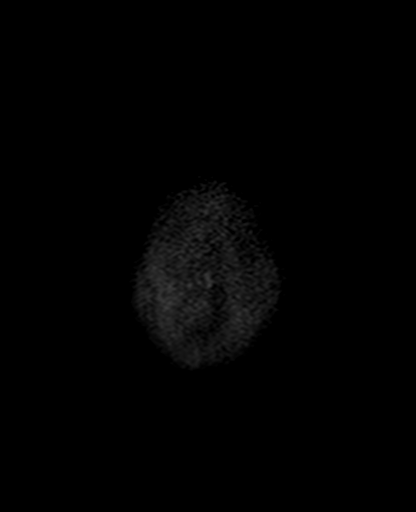

[Series 10: mag_images · axial · 3.0mm · 0.94mm/px · z∈[-127,+25]mm · 4 of 52 slices shown]
[im 1/52]
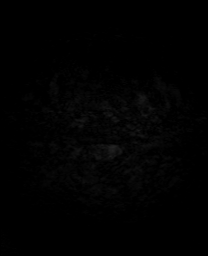
[im 18/52]
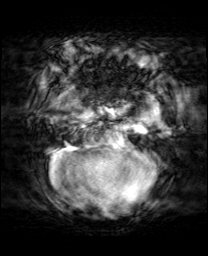
[im 35/52]
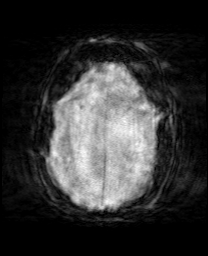
[im 52/52]
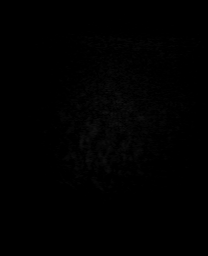

[Series 11: pha_images · axial · 3.0mm · 0.94mm/px · z∈[-124,+20]mm · 3 of 44 slices shown]
[im 1/44]
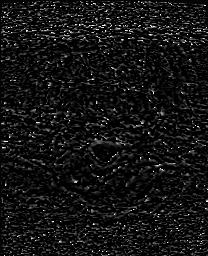
[im 22/44]
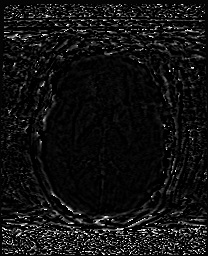
[im 44/44]
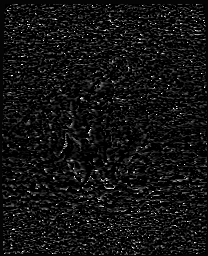

[Series 12: swi_images · axial · 3.0mm · 0.94mm/px · z∈[-127,+25]mm · 4 of 52 slices shown]
[im 1/52]
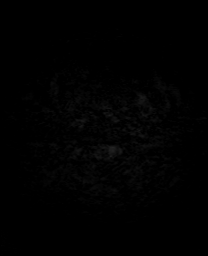
[im 18/52]
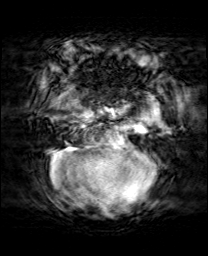
[im 35/52]
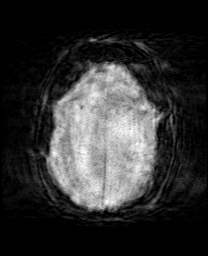
[im 52/52]
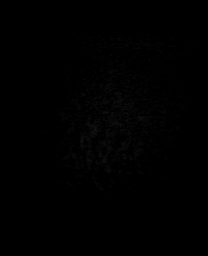

[Series 13: mip_images(sw) · axial · 24.0mm · 0.94mm/px · z∈[-116,+15]mm · 3 of 45 slices shown]
[im 1/45]
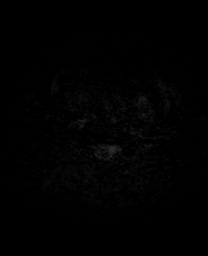
[im 23/45]
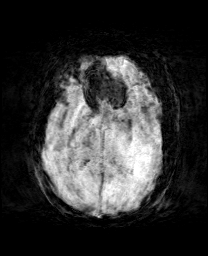
[im 45/45]
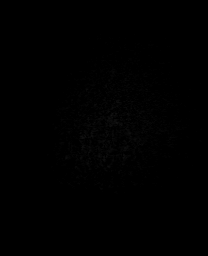

[Series 14: T1 · sagittal · 5.0mm · 0.94mm/px · 2 of 25 slices shown]
[im 1/25]
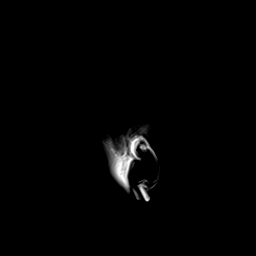
[im 25/25]
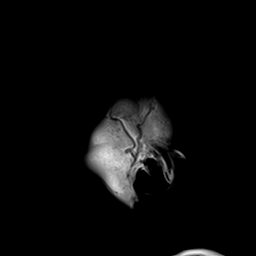

[Series 15: T2 · axial · 5.0mm · 0.75mm/px · z∈[-127,+22]mm · 2 of 26 slices shown (1 of 2)]
[im 1/26]
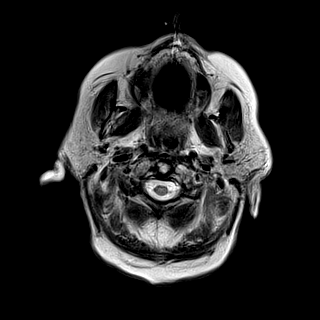
[im 26/26]
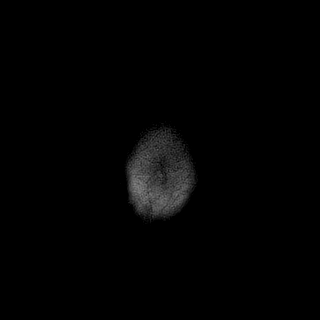

[Series 17: T2 · coronal · 5.0mm · 0.72mm/px · 2 of 28 slices shown (2 of 2)]
[im 1/28]
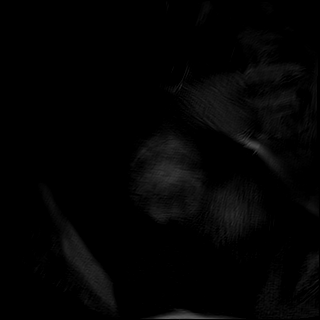
[im 28/28]
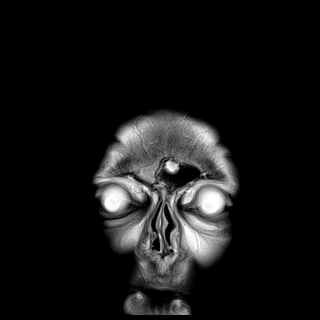

[44 of 48 positions shown; findings below may reference images not displayed]

FINDINGS: Brain: There are multiple small scattered cerebral white matter foci
of abnormal diffusion in both superior frontal lobes (series 5,
image 88). These appear mildly restricted on ADC. The largest is in
the left posterior centrum semiovale (series 5, image 87 and series
7, image 55) which is restricted.

There is also a small focus in the left periatrial white matter.

No other convincing restricted diffusion. There appear to be small
subacute or chronic infarcts in the bilateral cerebellum (series 15,
image 7). No definite anoxic injury in the bilateral deep gray
nuclei. No acute intracranial hemorrhage or mass effect.

No midline shift, mass effect, evidence of mass lesion,
ventriculomegaly, extra-axial collection or acute intracranial
hemorrhage. Cervicomedullary junction and pituitary are within
normal limits. No chronic cortical encephalomalacia identified.

Vascular: Major intracranial vascular flow voids are preserved.

Skull and upper cervical spine: Negative visible cervical spine.
Visualized bone marrow signal is within normal limits.

Sinuses/Orbits: No acute orbit soft tissue findings. Mild to
moderate paranasal sinus mucosal thickening. Trace left mastoid
fluid.

Other: Small volume retained secretions in the nasopharynx. Right
mastoids are clear. Visible internal auditory structures appear
normal.
IMPRESSION: 1. Several scattered small acute infarcts in the white matter of the
superior frontal lobes, left occipital lobe. No associated
hemorrhage or mass effect.
2. Several small subacute or perhaps chronic infarcts in the
bilateral cerebellum.
3. No definite anoxic injury to the deep gray nuclei, and no other
acute intracranial abnormality identified.

## 2020-06-12 IMAGING — DX DG CHEST 1V PORT
1 series · 1 of 1 positions shown · non-contrast
Comparison: One-view chest x-ray 08/14/2019

CLINICAL DATA: Shortness of breath.

EXAM:
PORTABLE CHEST 1 VIEW

[chest ap]
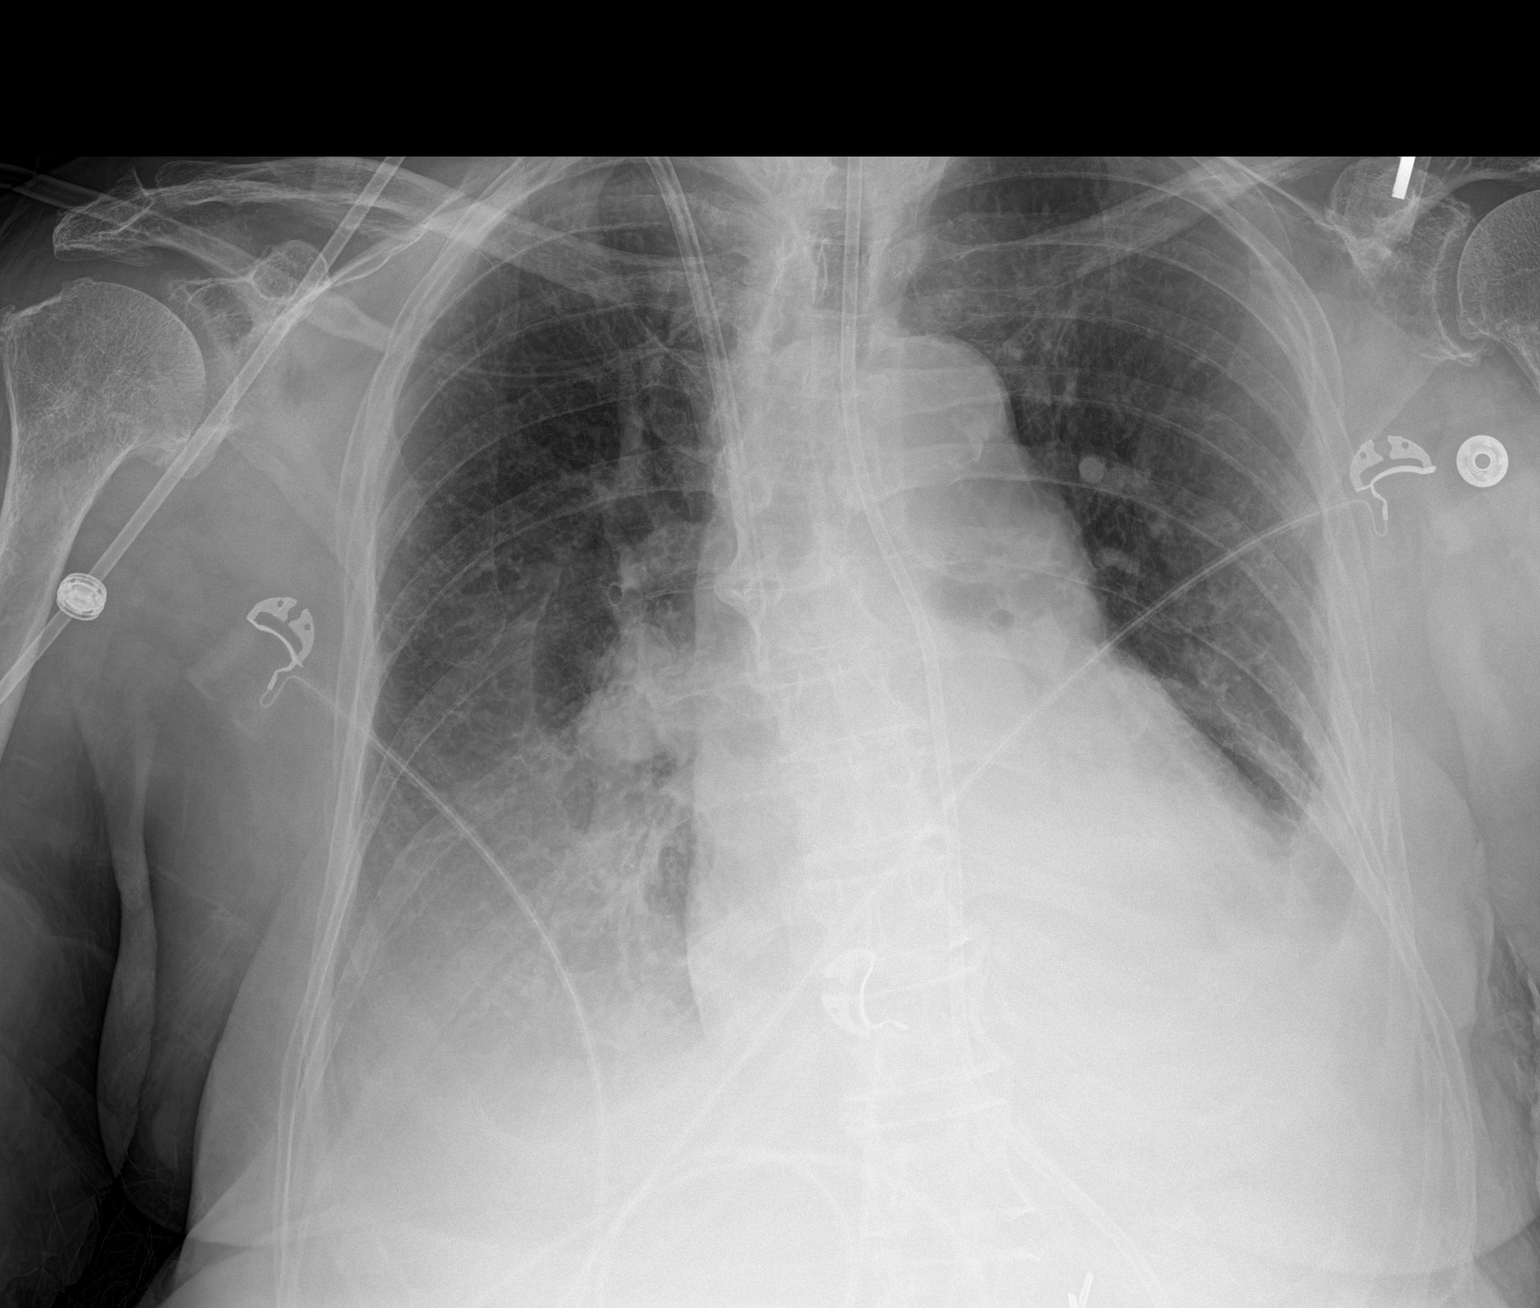

[1 of 1 positions shown; findings below may reference images not displayed]

FINDINGS: The heart is enlarged. Atherosclerotic calcifications are present at
the aortic arch. Feeding tube courses off the inferior border the
film. A right IJ line terminates in the mid SVC.

Increased pulmonary vascular congestion is present. Progressive
bilateral pleural effusions and basilar airspace disease is noted.
Upper lung fields are clear.
IMPRESSION: 1. Cardiomegaly with increasing pulmonary vascular congestion and
bilateral effusions consistent with congestive heart failure.
2. Bibasilar airspace disease likely reflects atelectasis. Infection
is not excluded.
3. Stable right IJ catheter.

## 2020-06-14 IMAGING — XA IR FLUORO GUIDE CV LINE*R*
1 series · 1 of 1 positions shown · non-contrast
Comparison: none

INDICATION: End-stage renal disease. In need of durable intravenous access for
the continuation of dialysis.

[Series 1: single · 1 of 1 slices shown]
[im 1/1]
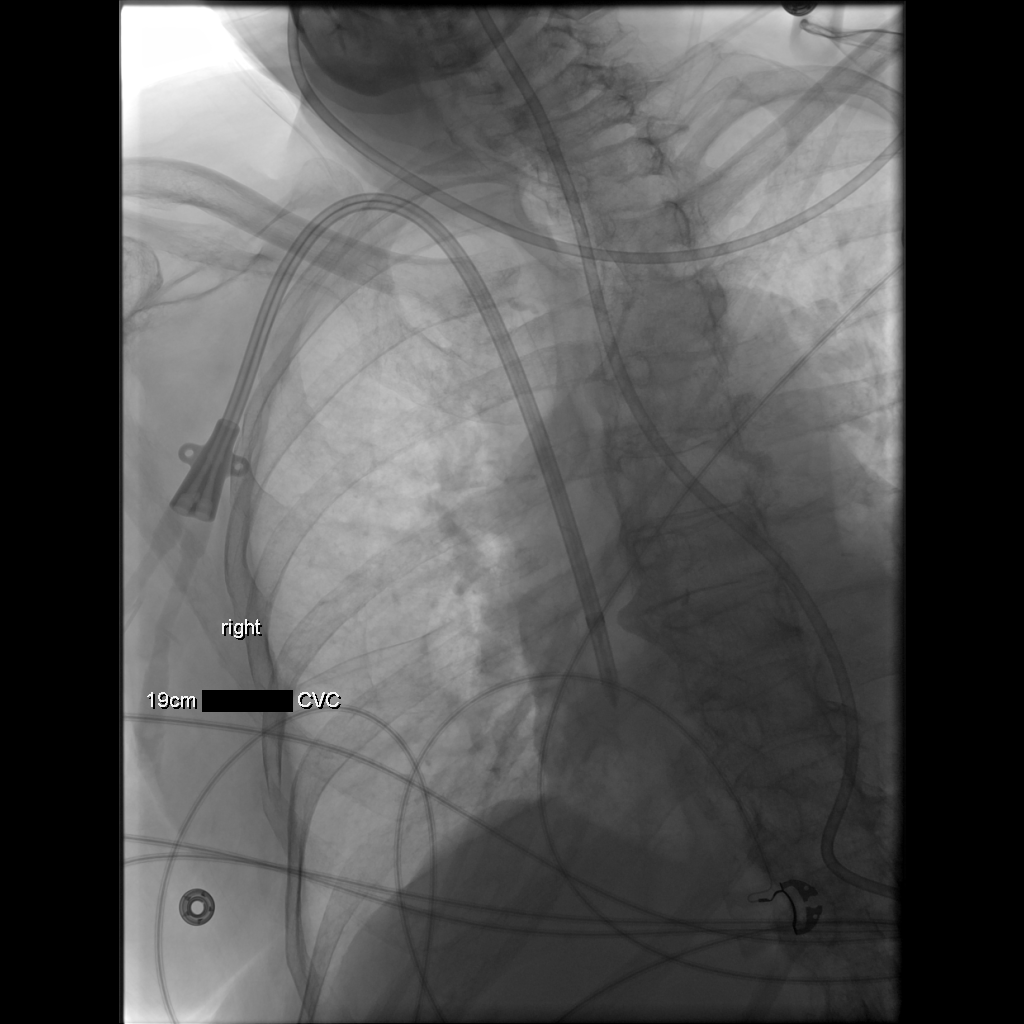

[1 of 1 positions shown; findings below may reference images not displayed]

EXAM:
TUNNELED CENTRAL VENOUS HEMODIALYSIS CATHETER PLACEMENT WITH
ULTRASOUND AND FLUOROSCOPIC GUIDANCE

MEDICATIONS:
Ancef 2 gm IV . The antibiotic was given in an appropriate time
interval prior to skin puncture.

ANESTHESIA/SEDATION:
Fentanyl 25 mcg IV;

Moderate Sedation Time:  10 minutes

The patient was continuously monitored during the procedure by the
interventional radiology nurse under my direct supervision.

FLUOROSCOPY TIME:  12 seconds (2 mGy)

COMPLICATIONS:
None immediate.

PROCEDURE:
Informed written consent was obtained from the the patient's husband
after a discussion of the risks, benefits, and alternatives to
treatment. Questions regarding the procedure were encouraged and
answered.

The existing right internal jugular approach temporary dialysis
catheter (previously placed at the patient's bedside, was removed
and superficial hemostasis was achieved with manual compression.

Next, the right neck and chest were prepped with chlorhexidine in a
sterile fashion, and a sterile drape was applied covering the
operative field. Maximum barrier sterile technique with sterile
gowns and gloves were used for the procedure. A timeout was
performed prior to the initiation of the procedure.

After creating a small venotomy incision, a micropuncture kit was
utilized to access the internal jugular vein. Real-time ultrasound
guidance was utilized for vascular access including the acquisition
of a permanent ultrasound image documenting patency of the accessed
vessel. The microwire was utilized to measure appropriate catheter
length.

A stiff Glidewire was advanced to the level of the IVC and the
micropuncture sheath was exchanged for a peel-away sheath. A
palindrome tunneled hemodialysis catheter measuring 19 cm from tip
to cuff was tunneled in a retrograde fashion from the anterior chest
wall to the venotomy incision.

The catheter was then placed through the peel-away sheath with tips
ultimately positioned within the superior aspect of the right
atrium. Final catheter positioning was confirmed and documented with
a spot radiographic image. The catheter aspirates and flushes
normally. The catheter was flushed with appropriate volume heparin
dwells.

The catheter exit site was secured with a 0-Prolene retention
suture. The venotomy incision was closed with Dermabond and
Memo. Dressings were applied. The patient tolerated the
procedure well without immediate post procedural complication.
IMPRESSION: Successful removal of existing non tunneled right jugular approach
temporary dialysis catheter and placement of new 19 cm tip to cuff
tunneled hemodialysis catheter via the right internal jugular vein
with tips terminating within the superior aspect of the right
atrium. The catheter is ready for immediate use.
